# Patient Record
Sex: Female | Born: 1945
Health system: Southern US, Community
[De-identification: ages and names within clinical notes are randomized; demographics above are authoritative.]

## PROBLEM LIST (undated history)

## (undated) DIAGNOSIS — F419 Anxiety disorder, unspecified: Secondary | ICD-10-CM

## (undated) DIAGNOSIS — I4891 Unspecified atrial fibrillation: Secondary | ICD-10-CM

## (undated) DIAGNOSIS — I10 Essential (primary) hypertension: Secondary | ICD-10-CM

## (undated) DIAGNOSIS — K649 Unspecified hemorrhoids: Secondary | ICD-10-CM

## (undated) DIAGNOSIS — R053 Chronic cough: Secondary | ICD-10-CM

## (undated) DIAGNOSIS — R7303 Prediabetes: Secondary | ICD-10-CM

## (undated) DIAGNOSIS — F329 Major depressive disorder, single episode, unspecified: Secondary | ICD-10-CM

## (undated) DIAGNOSIS — R531 Weakness: Secondary | ICD-10-CM

## (undated) DIAGNOSIS — Z8679 Personal history of other diseases of the circulatory system: Secondary | ICD-10-CM

## (undated) DIAGNOSIS — H269 Unspecified cataract: Secondary | ICD-10-CM

## (undated) DIAGNOSIS — K219 Gastro-esophageal reflux disease without esophagitis: Secondary | ICD-10-CM

## (undated) DIAGNOSIS — E785 Hyperlipidemia, unspecified: Secondary | ICD-10-CM

## (undated) DIAGNOSIS — Z86010 Personal history of colonic polyps: Secondary | ICD-10-CM

## (undated) DIAGNOSIS — F32A Depression, unspecified: Secondary | ICD-10-CM

## (undated) DIAGNOSIS — Z8709 Personal history of other diseases of the respiratory system: Secondary | ICD-10-CM

## (undated) DIAGNOSIS — N183 Chronic kidney disease, stage 3 unspecified: Secondary | ICD-10-CM

## (undated) DIAGNOSIS — R3915 Urgency of urination: Secondary | ICD-10-CM

## (undated) DIAGNOSIS — G4730 Sleep apnea, unspecified: Secondary | ICD-10-CM

## (undated) DIAGNOSIS — M1990 Unspecified osteoarthritis, unspecified site: Secondary | ICD-10-CM

## (undated) HISTORY — PX: ESOPHAGOGASTRODUODENOSCOPY: SHX1529

## (undated) HISTORY — PX: FRACTURE SURGERY: SHX138

## (undated) HISTORY — DX: Gastro-esophageal reflux disease without esophagitis: K21.9

## (undated) HISTORY — DX: Chronic kidney disease, stage 3 unspecified: N18.30

## (undated) HISTORY — PX: HEEL SPUR EXCISION: SHX1733

## (undated) HISTORY — DX: Personal history of other diseases of the circulatory system: Z86.79

## (undated) HISTORY — DX: Chronic cough: R05.3

## (undated) HISTORY — PX: BACK SURGERY: SHX140

## (undated) HISTORY — DX: Chronic kidney disease, stage 3 (moderate): N18.3

## (undated) HISTORY — DX: Essential (primary) hypertension: I10

## (undated) HISTORY — PX: OTHER SURGICAL HISTORY: SHX169

## (undated) HISTORY — PX: NISSEN FUNDOPLICATION: SHX2091

## (undated) HISTORY — PX: COLONOSCOPY: SHX174

## (undated) HISTORY — PX: FOOT SURGERY: SHX648

## (undated) HISTORY — PX: CARPAL TUNNEL RELEASE: SHX101

---

## 1973-01-12 HISTORY — PX: CHOLECYSTECTOMY: SHX55

## 1978-01-12 HISTORY — PX: ABDOMINAL HYSTERECTOMY: SHX81

## 1998-11-13 ENCOUNTER — Encounter: Admission: RE | Admit: 1998-11-13 | Discharge: 1998-11-13 | Payer: Self-pay | Admitting: Obstetrics and Gynecology

## 1998-11-13 ENCOUNTER — Encounter: Payer: Self-pay | Admitting: Obstetrics and Gynecology

## 1999-07-30 ENCOUNTER — Ambulatory Visit (HOSPITAL_BASED_OUTPATIENT_CLINIC_OR_DEPARTMENT_OTHER): Admission: RE | Admit: 1999-07-30 | Discharge: 1999-07-30 | Payer: Self-pay | Admitting: Orthopedic Surgery

## 1999-09-22 ENCOUNTER — Ambulatory Visit (HOSPITAL_BASED_OUTPATIENT_CLINIC_OR_DEPARTMENT_OTHER): Admission: RE | Admit: 1999-09-22 | Discharge: 1999-09-22 | Payer: Self-pay | Admitting: Orthopedic Surgery

## 1999-11-19 ENCOUNTER — Encounter: Payer: Self-pay | Admitting: Obstetrics and Gynecology

## 1999-11-19 ENCOUNTER — Encounter: Admission: RE | Admit: 1999-11-19 | Discharge: 1999-11-19 | Payer: Self-pay | Admitting: Obstetrics and Gynecology

## 2000-11-19 ENCOUNTER — Encounter: Payer: Self-pay | Admitting: Obstetrics and Gynecology

## 2000-11-19 ENCOUNTER — Encounter: Admission: RE | Admit: 2000-11-19 | Discharge: 2000-11-19 | Payer: Self-pay | Admitting: Obstetrics and Gynecology

## 2000-12-13 ENCOUNTER — Ambulatory Visit (HOSPITAL_COMMUNITY): Admission: RE | Admit: 2000-12-13 | Discharge: 2000-12-13 | Payer: Self-pay | Admitting: *Deleted

## 2000-12-13 ENCOUNTER — Encounter (INDEPENDENT_AMBULATORY_CARE_PROVIDER_SITE_OTHER): Payer: Self-pay | Admitting: *Deleted

## 2001-11-24 ENCOUNTER — Ambulatory Visit (HOSPITAL_COMMUNITY): Admission: RE | Admit: 2001-11-24 | Discharge: 2001-11-24 | Payer: Self-pay | Admitting: Family Medicine

## 2001-11-24 ENCOUNTER — Encounter: Payer: Self-pay | Admitting: Family Medicine

## 2001-12-05 ENCOUNTER — Encounter: Payer: Self-pay | Admitting: Obstetrics and Gynecology

## 2001-12-05 ENCOUNTER — Encounter: Admission: RE | Admit: 2001-12-05 | Discharge: 2001-12-05 | Payer: Self-pay | Admitting: Obstetrics and Gynecology

## 2001-12-14 ENCOUNTER — Encounter: Payer: Self-pay | Admitting: Family Medicine

## 2001-12-14 ENCOUNTER — Ambulatory Visit (HOSPITAL_COMMUNITY): Admission: RE | Admit: 2001-12-14 | Discharge: 2001-12-14 | Payer: Self-pay | Admitting: Family Medicine

## 2002-03-22 ENCOUNTER — Encounter: Payer: Self-pay | Admitting: Family Medicine

## 2002-03-22 ENCOUNTER — Ambulatory Visit (HOSPITAL_COMMUNITY): Admission: RE | Admit: 2002-03-22 | Discharge: 2002-03-22 | Payer: Self-pay | Admitting: Family Medicine

## 2002-11-17 ENCOUNTER — Ambulatory Visit (HOSPITAL_COMMUNITY): Admission: RE | Admit: 2002-11-17 | Discharge: 2002-11-17 | Payer: Self-pay | Admitting: Pulmonary Disease

## 2003-01-02 ENCOUNTER — Encounter: Admission: RE | Admit: 2003-01-02 | Discharge: 2003-01-02 | Payer: Self-pay | Admitting: Obstetrics and Gynecology

## 2003-04-27 ENCOUNTER — Ambulatory Visit (HOSPITAL_BASED_OUTPATIENT_CLINIC_OR_DEPARTMENT_OTHER): Admission: RE | Admit: 2003-04-27 | Discharge: 2003-04-27 | Payer: Self-pay | Admitting: Pulmonary Disease

## 2003-05-03 ENCOUNTER — Ambulatory Visit: Admission: RE | Admit: 2003-05-03 | Discharge: 2003-05-03 | Payer: Self-pay | Admitting: Pulmonary Disease

## 2003-09-04 ENCOUNTER — Ambulatory Visit (HOSPITAL_COMMUNITY): Admission: RE | Admit: 2003-09-04 | Discharge: 2003-09-04 | Payer: Self-pay | Admitting: *Deleted

## 2003-12-03 ENCOUNTER — Ambulatory Visit: Payer: Self-pay | Admitting: Pulmonary Disease

## 2004-01-03 ENCOUNTER — Encounter: Admission: RE | Admit: 2004-01-03 | Discharge: 2004-01-03 | Payer: Self-pay | Admitting: Family Medicine

## 2004-02-19 ENCOUNTER — Ambulatory Visit: Payer: Self-pay | Admitting: Pulmonary Disease

## 2004-02-25 ENCOUNTER — Ambulatory Visit: Payer: Self-pay | Admitting: Pulmonary Disease

## 2004-03-10 ENCOUNTER — Ambulatory Visit: Payer: Self-pay | Admitting: Pulmonary Disease

## 2004-03-16 ENCOUNTER — Emergency Department (HOSPITAL_COMMUNITY): Admission: EM | Admit: 2004-03-16 | Discharge: 2004-03-16 | Payer: Self-pay | Admitting: Emergency Medicine

## 2004-03-18 ENCOUNTER — Ambulatory Visit: Payer: Self-pay | Admitting: Pulmonary Disease

## 2004-03-25 ENCOUNTER — Ambulatory Visit (HOSPITAL_COMMUNITY): Admission: RE | Admit: 2004-03-25 | Discharge: 2004-03-25 | Payer: Self-pay | Admitting: Family Medicine

## 2004-04-11 ENCOUNTER — Ambulatory Visit: Payer: Self-pay | Admitting: Pulmonary Disease

## 2004-04-28 ENCOUNTER — Ambulatory Visit: Payer: Self-pay | Admitting: Pulmonary Disease

## 2004-06-05 ENCOUNTER — Ambulatory Visit (HOSPITAL_COMMUNITY): Admission: RE | Admit: 2004-06-05 | Discharge: 2004-06-06 | Payer: Self-pay | Admitting: Gastroenterology

## 2004-06-27 ENCOUNTER — Encounter: Admission: RE | Admit: 2004-06-27 | Discharge: 2004-06-27 | Payer: Self-pay | Admitting: General Surgery

## 2004-07-03 ENCOUNTER — Ambulatory Visit (HOSPITAL_COMMUNITY): Admission: RE | Admit: 2004-07-03 | Discharge: 2004-07-03 | Payer: Self-pay | Admitting: *Deleted

## 2004-08-23 ENCOUNTER — Inpatient Hospital Stay (HOSPITAL_COMMUNITY): Admission: RE | Admit: 2004-08-23 | Discharge: 2004-08-24 | Payer: Self-pay | Admitting: General Surgery

## 2004-10-06 ENCOUNTER — Encounter: Admission: RE | Admit: 2004-10-06 | Discharge: 2004-10-06 | Payer: Self-pay | Admitting: General Surgery

## 2005-01-15 ENCOUNTER — Encounter: Admission: RE | Admit: 2005-01-15 | Discharge: 2005-01-15 | Payer: Self-pay | Admitting: Obstetrics and Gynecology

## 2006-01-19 ENCOUNTER — Encounter: Admission: RE | Admit: 2006-01-19 | Discharge: 2006-01-19 | Payer: Self-pay | Admitting: Family Medicine

## 2007-01-24 ENCOUNTER — Encounter: Admission: RE | Admit: 2007-01-24 | Discharge: 2007-01-24 | Payer: Self-pay | Admitting: Obstetrics and Gynecology

## 2008-02-02 ENCOUNTER — Encounter: Admission: RE | Admit: 2008-02-02 | Discharge: 2008-02-02 | Payer: Self-pay | Admitting: Family Medicine

## 2008-03-14 ENCOUNTER — Encounter: Admission: RE | Admit: 2008-03-14 | Discharge: 2008-03-14 | Payer: Self-pay | Admitting: Obstetrics and Gynecology

## 2008-12-23 ENCOUNTER — Emergency Department (HOSPITAL_COMMUNITY): Admission: EM | Admit: 2008-12-23 | Discharge: 2008-12-23 | Payer: Self-pay | Admitting: Emergency Medicine

## 2009-01-22 ENCOUNTER — Ambulatory Visit (HOSPITAL_BASED_OUTPATIENT_CLINIC_OR_DEPARTMENT_OTHER): Admission: RE | Admit: 2009-01-22 | Discharge: 2009-01-22 | Payer: Self-pay | Admitting: Orthopedic Surgery

## 2009-03-15 ENCOUNTER — Encounter: Admission: RE | Admit: 2009-03-15 | Discharge: 2009-03-15 | Payer: Self-pay | Admitting: Family Medicine

## 2009-04-22 ENCOUNTER — Emergency Department (HOSPITAL_COMMUNITY): Admission: EM | Admit: 2009-04-22 | Discharge: 2009-04-22 | Payer: Self-pay | Admitting: Emergency Medicine

## 2009-06-28 ENCOUNTER — Other Ambulatory Visit: Admission: RE | Admit: 2009-06-28 | Discharge: 2009-06-28 | Payer: Self-pay | Admitting: Pediatrics

## 2010-02-02 ENCOUNTER — Encounter: Payer: Self-pay | Admitting: General Surgery

## 2010-02-02 ENCOUNTER — Encounter: Payer: Self-pay | Admitting: Family Medicine

## 2010-02-06 ENCOUNTER — Other Ambulatory Visit: Payer: Self-pay | Admitting: Family Medicine

## 2010-02-06 DIAGNOSIS — Z1239 Encounter for other screening for malignant neoplasm of breast: Secondary | ICD-10-CM

## 2010-03-17 ENCOUNTER — Ambulatory Visit
Admission: RE | Admit: 2010-03-17 | Discharge: 2010-03-17 | Disposition: A | Discharge status: Home / Self Care | Payer: BC Managed Care – PPO | Source: Ambulatory Visit | Attending: Family Medicine | Admitting: Family Medicine

## 2010-03-17 DIAGNOSIS — Z1239 Encounter for other screening for malignant neoplasm of breast: Secondary | ICD-10-CM

## 2010-03-29 LAB — BASIC METABOLIC PANEL
BUN: 16 mg/dL (ref 6–23)
CO2: 28 mEq/L (ref 19–32)
Calcium: 9.1 mg/dL (ref 8.4–10.5)
Creatinine, Ser: 0.95 mg/dL (ref 0.4–1.2)
GFR calc Af Amer: >60 mL/min (ref >60)
GFR calc non Af Amer: 60 mL/min — ABNORMAL LOW (ref >60)
Glucose, Bld: 178 mg/dL — ABNORMAL HIGH (ref 70–99)
Potassium: 3.8 mEq/L (ref 3.5–5.1)
Sodium: 137 mEq/L (ref 135–145)

## 2010-04-15 LAB — POCT CARDIAC MARKERS
CKMB, poc: 1 ng/mL (ref 1.0–8.0)
Myoglobin, poc: 89.3 ng/mL (ref 12–200)
Troponin i, poc: <0.05 ng/mL (ref 0.00–0.09)

## 2010-05-30 NOTE — Op Note (Signed)
[name] [name], [name] [name]                 ACCOUNT NO.:  [id number]   MEDICAL RECORD NO.:  [id number]          PATIENT TYPE:  AMB   LOCATION:  DAY                          FACILITY:  [facility]   PHYSICIAN:  Angelia Mould. Derrell Lolling, M.D.DATE OF BIRTH:  [birth year]   DATE OF PROCEDURE:  08/22/2004  DATE OF DISCHARGE:                                 OPERATIVE REPORT   PREOPERATIVE DIAGNOSIS:  Gastroesophageal reflux disease.   POSTOPERATIVE DIAGNOSIS:  Gastroesophageal reflux disease.   OPERATION PERFORMED:  Laparoscopic Nissen fundoplication.   FIRST ASSISTANT:  Currie Paris, M.D.   OPERATIVE INDICATIONS:  This is a 65 year old morbidly obese white female.  She has had problems with laryngopharyngeal reflux for months, if not years.  Her major symptoms are spells of coughing, relieved by recumbent position.  She has noted intermittent voice change, exacerbated by eating certain food.  She does not have any dysphagia or water brash.  She had an open  cholecystectomy many years ago.  Her evaluation reveals on upper endoscopy  that her anatomy is fairly unremarkable.  She has had a 24-hour pH probe,  which was significantly positive for reflux.  She has had manometry, which  shows a normal esophageal peristalsis and a hypotensive lower esophageal  sphincter.  Upper GI shows a small sliding hiatal hernia with free reflux  but no stricture and slightly delayed gastric emptying.  Elective antireflux  surgery was offered to the patient and she elected to have that done.   OPERATIVE TECHNIQUE:  Following the induction of general endotracheal  anesthesia, Foley catheter was inserted.  The patient's abdomen was prepped  and draped in a sterile fashion.  Intravenous antibiotics were given.  She  was identified.  An 11 mm Optiview port was placed in the left upper  quadrant.  This was a atraumatic.  Pneumoperitoneum was created.  The OK was  inserted.  Visualization revealed no bleeding or injury in the  left upper  quadrant.  The left lobe of the liver looked slightly large.  There were a  lot of adhesions in the midline and across the right upper quadrant.  I  placed a 10 mm trocar in the left rectus muscle about 12 cm above the  umbilicus.  I then used the Harmonic scalpel to take down all of the  adhesions from the midline and the right upper quadrant and around the  falciform ligament.  I also found that there were some adhesions tethering  the pylorus of the stomach anteriorly and I took those down in case that was  altering her gastric emptying.  The stomach itself looked normal.  A 10 mm  trocar was placed in the right rectus sheath about 12 cm above the  umbilicus.  The 10 mm trocar was placed in the right subcostal region.  A 5  mm trocar was placed in the subxiphoid region and a deformable liver  retractor was inserted and held in place to lift up the left upper lobe.   Using traction and countertraction, we identified the hiatus of the stomach,  the esophagus, the right  and left crura of the diaphragm.  We divided the  gastrohepatic omentum using the Harmonic scalpel.  We continued the  dissection anteriorly across the anterior apex of the crura, dividing all of  the peritoneum and areolar tissue, and then took the dissection down the  anterior edge of the left crus a little bit.  This allowed [place] to identify  the esophagus and free it up a little bit.  We dissected some of the soft  tissue behind the esophagus from the right side and this helped mobilize the  esophagus a little bit but we could not create a window from the right side.   We then used the Harmonic scalpel to take down all of the short gastric  vessels.  This took some time because of her obesity but we were able to  take them completely down and completely free the fundus off of the short  gastric vessels and completely free the fundus off of the left crus.  We  dissected some of the peritoneum off of the left  crus and completely freed  it up posteriorly as well.  We then placed a Penrose drain around the  esophagus to promote mobilization.  This allowed [place] to enlarge the window  behind the esophagus.  The really was not much of a hiatal hernia at all and  the crura were not very wide.  We did place a single pledgeted suture of 0  Surgidac across the diaphragmatic crura posteriorly.  These were tied down  with the Ti-Knot device.  We then passed a 50-French lighted bougie into the  stomach under the direction of Dr. Sherrian Divers.  This was uneventful.  With the 50 bougie in place, the diaphragmatic hiatus seemed to be closed  adequately but we could pass the 5 mm instrument beside it without much  difficulty.   We then found a suitable piece of fundus and passed it from the left side  around posteriorly to the right.  We then found a suitable piece of fundus  on the left side and set up the wrap.  We found that we could wrap around  the 50-French bougie without any tension whatsoever.  The fundoplication was  created with three interrupted sutures of 0 Surgidac placed 1 cm apart,  making about a 2 to 2.5 cm length wrap.  Each of these three sutures was  tied using the Ti-Knot device.  After this was completed, we irrigated the  field.  We found no bleeding whatsoever.  We had had a little bit of  bleeding on the crura on the right and from some of the short gastric  vessels earlier on but this was minor, and at the completion of the case,  everything was completely dry and the irrigation fluid was completely clear.  We assessed the diaphragmatic closure, pulled the dilator out, assessed the  Nissen fundoplication and thought that it looked very good, saw no bleeding  from any of the adhesions, and felt that the case was completed.  The  trocars were removed under direct vision.  There was no bleeding from the trocar sites.  The pneumoperitoneum was released.  The skin incision was  closed with  skin staples.  Clean bandages were placed and the patient taken  to the recovery room in stable condition.  Estimated blood loss was about 30  to 40 cc.  Complications none.  Sponge and instrument counts were correct.      Angelia Mould. Derrell Lolling,  M.D.  Electronically Signed     HMI/MEDQ  D:  08/22/2004  T:  08/22/2004  Job:  [id number]   cc:   Althea Grimmer. Luther Parody, M.D.  [address], [address]  [place]  [place] [address]  Fax: [phone]   Danice Goltz, M.D. [facility]  [address] [place], [place] [address]   Stacie Acres. Cliffton Asters, M.D.  Fax: [phone]

## 2010-05-30 NOTE — Op Note (Signed)
[name] [name], SPOERL                 ACCOUNT NO.:  [id number]   MEDICAL RECORD NO.:  [id number]          PATIENT TYPE:  AMB   LOCATION:  ENDO                         FACILITY:  [facility]   PHYSICIAN:  Althea Grimmer. Santogade, M.D.DATE OF BIRTH:  1946/01/07   DATE OF PROCEDURE:  07/03/2004  DATE OF DISCHARGE:  07/03/2004                                 OPERATIVE REPORT   PROCEDURE:  Esophageal manometry.   The five-channel transnasal esophageal probe was passed.  The lower  esophageal sphincter was measured from 40-44.5 cm for a total lower  esophageal sphincter length of 4.5 cm.  The lower esophageal sphincter  pressure was 14.7 mmHg, which is low normal.  With wet swallows, however,  there was 57% relaxation, which is normal.  The manometric tracings revealed  the swallows to be clearly peristaltic with normal amplitudes.   IMPRESSION:  Essentially normal esophageal manometry study with low normal  lower esophageal sphincter pressures.   RECOMMENDATIONS:  If reflux is uncontrolled with dietary measures and proton  pump inhibitor, she is a candidate for laparoscopic Nissen fundoplication.       PJS/MEDQ  D:  07/21/2004  T:  07/22/2004  Job:  [id number]   cc:   Angelia Mould. Derrell Lolling, M.D.  [address], [address]  [place]  [place] [address]   Stacie Acres. Cliffton Asters, M.D.  Fax: [phone]

## 2010-05-30 NOTE — Op Note (Signed)
NAMEANZLEY, DIBBERN                 ACCOUNT NO.:  [id number]   MEDICAL RECORD NO.:  [id number]          PATIENT TYPE:  AMB   LOCATION:  ENDO                         FACILITY:  [facility]   PHYSICIAN:  Althea Grimmer. Santogade, M.D.DATE OF BIRTH:  02-May-1945   DATE OF PROCEDURE:  06/05/2004  DATE OF DISCHARGE:                                 OPERATIVE REPORT   PROCEDURE PERFORMED:  Twenty-four-hour ambulatory pH probe.   ENDOSCOPIST:  Althea Grimmer. Santogade, M.D.   DURATION:  Study duration was 23 hours 57 minutes.   FINDINGS:  The patient had 53 minutes of pH below 4 in the proximal channel  or 6% of total time with her longest episode being over three minutes.  In  the upright position in the distal channel she had 10.9% of time below pH 4  with 96 total minutes and 164 separate reflux episodes.  Her episodes of  reflux corresponded well  with the decrease in the pH.  She also had reflux  with coughing and belching.  Her Johnson-DeMeester  composite score was  10.9% in the upright position with normal being less than 6.3.  In the  recumbent position she had essentially no reflux with a score of 0.1%.   IMPRESSION:  Clearly documented reflux, which it is my understanding  occurred while taking once daily proton pump inhibitor. The patient remains  a candidate for antireflux surgery or twice daily proton pump inhibitors. It  is unfortunate that she refused to complete the manometry portion of the  study.       PJS/MEDQ  D:  06/11/2004  T:  06/12/2004  Job:  [id number]

## 2010-05-30 NOTE — Op Note (Signed)
[place]. [facility]  Patient:    [name] [name], [name] [name]                          MRN: [id number] Proc. Date: 07/30/99 Attending:  Elana Alm. Thurston Hole, M.D.                           Operative Report  PREOPERATIVE DIAGNOSIS:  Left heel plantar fascitis and calcaneal exostosis.  POSTOPERATIVE DIAGNOSIS:  Left heel plantar fascitis and calcaneal exostosis.  OPERATION:  Left heel plantar fascia release with partial calcaneal exostectomy.  SURGEON:  Elana Alm. Thurston Hole, M.D.  ASSISTANTTonny Bollman Adelberger, P.A.-C  ANESTHESIA:  General/ankle block.  OPERATIVE TIME:  30 minutes.  COMPLICATIONS:  None.  INDICATIONS FOR SURGERY:  [name] is a 65 year old woman who has had over two to three years of left heel plantar fascitis that has failed conservative care and is now to undergo plantar fascia release.  DESCRIPTION OF PROCEDURE:  [name] was brought to the operating room on July 30, 1999, after an ankle block had been placed in the holding area.  She was placed on the operating room in the supine position.  Her left leg was prepped using sterile Betadine and draped using the sterile technique. Initially I had tested the block, however, there was still some sensation in the bottom of the foot and thus she was converted to general anesthesia.  Her left leg was exsanguinated and a calf tourniquet elevated to 275 mm. Initially through a 3 cm plantar medial incision, initial exposure was made. The underlying subcutaneous tissues were incised along with a skin incision. The medial plantar nerve carefully protected while the medial aspect of the plantar fascia was exposed.  A hemostat was placed, one superior and one inferior to the plantar fascia and then this was released off of his calcaneal attachment.  The underlying spur was also resected with a rongeur.  After this was done there was found to be a very smooth surface where there had been a a very rough spur and  there was found to be a satisfactory release of the plantar fascia.  The wounds were irrigated then closed using 3-0 Vicryl and 4-0 nylon.  Sterile compressive dressings were applied, and then the tourniquet was released and then the patient awakened and taken to the recovery room in stable condition.  FOLLOWUP CARE:  [name] will be followed as an outpatient on Vicodin and Naprosyn.  She will see me back in the office in a week for suture removal and followup DD:  07/30/99 TD:  07/30/99 Job: [id number]

## 2010-05-30 NOTE — Op Note (Signed)
[name] [name], [name] [name]                             ACCOUNT NO.:  [id number]   MEDICAL RECORD NO.:  [id number]                   PATIENT TYPE:  OUT   LOCATION:  CARD                                 FACILITY:  [facility]   PHYSICIAN:  Oley Balm. Sung Amabile, M.D. [facility]          DATE OF BIRTH:  Jan 18, 1945   DATE OF PROCEDURE:  05/03/2003  DATE OF DISCHARGE:  05/03/2003                                 OPERATIVE REPORT   PROCEDURE:  Cardiopulmonary stress test.   INDICATION:  Exertional dyspnea.   RESULTS:  Cardiopulmonary stress testing was performed on a graded  treadmill.  Testing was stopped due to dyspnea.  Effort was submaximal to  maximal.  Oxygen uptake at peak exercise was 1.69 liters per minute, which  is 94% of predicted, indicating normal exercise tolerance.  When corrected  for his body weight however, oxygen uptake was 14.3 mL/min per kg, which is  53% of predicted value.  This suggests that obesity is a major component of  his exercise limitation.   A peak exercise heart rate was 117, or 71% of predicted value, indicating  that cardiovascular reserve remained.  Heart rate slowed but was somewhat  blunted, probably as a result of beta-blocker therapy.  Oxygen pulse was  normal suggesting normal left ventricular function.  Blood pressure response  to exercise was normal.  EKG revealed no ischemia and no arrhythmias.   At peak exercise, minute ventilation was 61.2 liters per minute or 65% of  predicted value.  This indicates that  ventilatory reserve remained.  Gas  exchange parameters revealed no abnormalities.  Baseline spirometry was not  performed.  Post exercise spirometry was not performed.   SUMMARY:  Overall normal exercise tolerance and normal cardiopulmonary  response to exercise except for blunted heart rate response due to beta-  blocker therapy.  When his maximum oxygen uptake is corrected for his body  weight, he exhibits a moderate impairment.  This suggests that his  exercise  impairment is attributable entirely to obesity.                                               Oley Balm Sung Amabile, M.D. [facility]    DBS/MEDQ  D:  05/23/2003  T:  05/23/2003  Job:  [id number]   cc:   Danice Goltz, M.D. [facility]

## 2010-05-30 NOTE — Discharge Summary (Signed)
[name] [name], PERHAM                 ACCOUNT NO.:  [id number]   MEDICAL RECORD NO.:  [id number]          PATIENT TYPE:  INP   LOCATION:  1607                         FACILITY:  [facility]   PHYSICIAN:  Angelia Mould. Derrell Lolling, M.D.DATE OF BIRTH:  Jul 08, 1945   DATE OF ADMISSION:  08/22/2004  DATE OF DISCHARGE:  08/24/2004                                 DISCHARGE SUMMARY   FINAL DIAGNOSES:  1.  Gastroesophageal reflux disease.  2.  Suspect laryngopharyngeal reflux.  3.  Hypertension.  4.  Sleep apnea.  5.  Status post open cholecystectomy.  6.  Status post partial hysterectomy.   OPERATION PERFORMED:  Laparoscopic Nissen fundoplication, date of surgery  August 22, 2004.   HISTORY:  This is a 65 year old white female who has been having symptoms of  laryngopharyngeal reflux for some time with symptoms of coughing and voice  change exacerbated by eating certain acid containing foods. She denies  dysphagia and denies water brash, did have belching and acid sensation, a  couple of episodes where she choked on fluid at night. She has been  evaluated by Dr. Danice Goltz as an outpatient for obstructive sleep apnea  and Dr. Jayme Cloud has suspected reflux disease. She has been seen by Dr. Elvera Maria who did an ENT exam and did not see any abnormalities. She was evaluated  by Roosvelt Harps with upper endoscopy which was unremarkable. CT scan  which was negative, and a 24-hour pH probe which was significantly positive  for reflux. I was asked to see her. I felt she was a reasonable candidate  for antireflux surgery.  We arranged for a manometry which showed hypotonic  which showed a lower esophageal sphincter but good esophageal peristalsis  and amplitude. Upper GI showed a small sliding hiatal hernia and free reflux  with no stricture or mass. She had very slight delayed gastric emptying. She  was counseled regarding options of surgical intervention and wanted to have  that done.   PHYSICAL  EXAMINATION:  GENERAL:  This is an overweight white female. Her  voice was somewhat strained. Height 5 feet, 7 inches, weight 236.  VITAL SIGNS: Blood pressure 150/83, pulse 87.  NECK:  Revealed no mass or jugular distension.  LUNGS:  Clear to auscultation. No wheezing.  HEART:  Regular rate and rhythm, no murmur. Peripheral pulses were palpable.  ABDOMEN:  Obese, soft, nontender. Well-healed right subcostal incision.  There was no hernia or mass.   HOSPITAL COURSE:  On admission the patient was taken to the operating room  and underwent a laparoscopic Nissen fundoplication. We placed one pledgeted  suture in the esophageal hiatus but that was all that was necessary. We then  performed a standard Nissen fundoplication over a 50 French lighted dilator.  Postoperatively the patient did well. She had minimal cough on postoperative  day 1. Her abdomen was soft. She was started a clear liquid diet. That was  advanced slowly to a full liquid diet and she did well and was able to be  discharged on August 24, 2004. At that time she was swallowing well  without  dysphagia, no nausea or bloating or pain. Wounds looked fine. Vital signs  were stable.   MEDICATIONS:  She was advised to continue her medications which include:  1.  Diovan HCT 160/25 daily.  2.  Bayer aspirin 81 mg daily.  3.  Zelnorm 6 mg b.i.d.  4.  Singulair 10 daily.  5.  Zegerid 40 mg b.i.d.  6.  Omega 3 fatty acids daily.   FOLLOW UP:  She was asked to follow with me in the office in approximately 2  weeks. She was counseled carefully regarding a full liquid diet and avoiding  hot and cold food, and  Avoiding large bolus meals. She was also given a prescription for Vicodin  for pain.      Angelia Mould. Derrell Lolling, M.D.  Electronically Signed     HMI/MEDQ  D:  10/07/2004  T:  10/08/2004  Job:  [id number]   cc:   Stacie Acres. Cliffton Asters, M.D.  Fax: [phone]   Althea Grimmer. Luther Parody, M.D.  Fax: [phone]

## 2010-05-30 NOTE — Op Note (Signed)
[place]. [facility]  Patient:    [name] [name], [name] [name]                          MRN: [id number] Proc. Date: 09/22/99 Attending:  Elana Alm. Thurston Hole, M.D.                           Operative Report  PREOPERATIVE DIAGNOSIS:  Right heel plantar fasciitis and calcaneal exostosis.  POSTOPERATIVE DIAGNOSIS:  Right heel plantar fasciitis and calcaneal exostosis.  PROCEDURE:  Right heel plantar fascia release with calcaneal exostectomy.  SURGEON:  Elana Alm. Thurston Hole, M.D.  ASSISTANT:  Kirstin Adelberger, P.A.  ANESTHESIA:  Ankle block.  OPERATIVE TIME:  30 minutes.  COMPLICATIONS:  None.  INDICATIONS:  [name] is a 65 year old woman who has had significant right heel plantar fascia pain for the past 6 to 8 months increasing in nature with signs and symptoms consistent with plantar fasciitis and calcaneal exostosis and is now to undergo plantar fascia release with calcaneal exostectomy.  DESCRIPTION OF PROCEDURE:  [name] is brought to the operating room on September 22, 1999, placed on the operating table in the supine position. Right foot and leg was prepped using sterile Betadine and draped using sterile technique.  Initially, the foot and leg was exsanguinated and a calf tourniquet elevated to 275 mm.  A 3 cm plantar medial incision was made.  The underlying subcutaneous tissues were incised in line with the skin incision. The plantar nerves were carefully protected while the underlying plantar fascia was exposed.  A hemostat was placed with one of these on the plantar surface of the plantar fascia and one on the dorsal surface and then the plantar fascia was released from its calcaneal origin.  It was found to be very tight and inflammed.  Underlying this, then a small spur was removed with a rongeur and confirmed by intraoperative fluoroscopy.  After this was done, it was felt that all the pathology had been satisfactorily addressed.  The wound was  irrigated and closed with 3-0 Vicryl in the subcutaneous tissues and 4-0 nylon on the skin.  Sterile dressings were applied.  The tourniquet was released and the patient taken to the recovery room in stable condition.  FOLLOW-UP:  Ms. Dicke will be followed as an outpatient on Vicodin for pain. See her back in the office in a week for wound check and follow-up. DD:  02/24/00 TD:  02/24/00 Job: [id number]

## 2010-07-11 ENCOUNTER — Other Ambulatory Visit: Payer: Self-pay | Admitting: Family Medicine

## 2010-07-11 ENCOUNTER — Ambulatory Visit (HOSPITAL_COMMUNITY)
Admission: RE | Admit: 2010-07-11 | Discharge: 2010-07-11 | Disposition: A | Discharge status: Home / Self Care | Payer: Self-pay | Source: Ambulatory Visit | Attending: Family Medicine | Admitting: Family Medicine

## 2010-07-11 DIAGNOSIS — R609 Edema, unspecified: Secondary | ICD-10-CM

## 2010-07-11 DIAGNOSIS — M79609 Pain in unspecified limb: Secondary | ICD-10-CM

## 2010-07-11 DIAGNOSIS — R52 Pain, unspecified: Secondary | ICD-10-CM

## 2010-07-11 DIAGNOSIS — M7989 Other specified soft tissue disorders: Secondary | ICD-10-CM | POA: Insufficient documentation

## 2011-03-18 ENCOUNTER — Other Ambulatory Visit: Payer: Self-pay | Admitting: Family Medicine

## 2011-03-18 DIAGNOSIS — Z1231 Encounter for screening mammogram for malignant neoplasm of breast: Secondary | ICD-10-CM

## 2011-03-27 ENCOUNTER — Ambulatory Visit
Admission: RE | Admit: 2011-03-27 | Discharge: 2011-03-27 | Disposition: A | Discharge status: Home / Self Care | Payer: Medicare Other | Source: Ambulatory Visit | Attending: Family Medicine | Admitting: Family Medicine

## 2011-03-27 DIAGNOSIS — Z1231 Encounter for screening mammogram for malignant neoplasm of breast: Secondary | ICD-10-CM

## 2011-12-23 ENCOUNTER — Encounter (HOSPITAL_COMMUNITY): Payer: Self-pay | Admitting: Pharmacy Technician

## 2011-12-23 ENCOUNTER — Encounter (HOSPITAL_COMMUNITY): Payer: Self-pay | Admitting: *Deleted

## 2011-12-23 NOTE — Pre-Procedure Instructions (Signed)
Your procedure is scheduled [id number], December 29, 2011 Report to [facility] Admitting [id number] Call this number if you have problems morning of your procedure:[phone]  Follow all bowel prep instructions per your doctor's orders.  Do not eat or drink anything after midnight the night before your procedure. You may brush your teeth, rinse out your mouth, but no water, no food, no chewing gum, no mints, no candies, no chewing tobacco.     Take these medicines the morning of your procedure with A SIP OF WATER:Norvasc and Nexium   Please make arrangements for a responsible person to drive you home after the procedure. You cannot go home by cab/taxi. We recommend you have someone with you at home the first 24 hours after your procedure. Driver for procedure is spouse Jillyn Hidden  LEAVE ALL VALUABLES, JEWELRY, BILLFOLD AT HOME.  NO DENTURES, CONTACT LENSES ALLOWED IN THE ENDOSCOPY ROOM.   YOU MAY WEAR DEODORANT, PLEASE REMOVE ALL JEWELRY, WATCHES RINGS, BODY PIERCINGS AND LEAVE AT HOME.   WOMEN: NO MAKE-UP, LOTIONS PERFUMES

## 2011-12-29 ENCOUNTER — Ambulatory Visit (HOSPITAL_COMMUNITY): Payer: Medicare Other | Admitting: Anesthesiology

## 2011-12-29 ENCOUNTER — Encounter (HOSPITAL_COMMUNITY): Payer: Self-pay | Admitting: *Deleted

## 2011-12-29 ENCOUNTER — Encounter (HOSPITAL_COMMUNITY): Payer: Self-pay | Admitting: Anesthesiology

## 2011-12-29 ENCOUNTER — Ambulatory Visit (HOSPITAL_COMMUNITY)
Admission: RE | Admit: 2011-12-29 | Discharge: 2011-12-29 | Disposition: A | Discharge status: Home / Self Care | Payer: Medicare Other | Source: Ambulatory Visit | Attending: Gastroenterology | Admitting: Gastroenterology

## 2011-12-29 ENCOUNTER — Encounter (HOSPITAL_COMMUNITY)
Admission: RE | Disposition: A | Discharge status: Home / Self Care | Payer: Self-pay | Source: Ambulatory Visit | Attending: Gastroenterology

## 2011-12-29 ENCOUNTER — Other Ambulatory Visit: Payer: Self-pay | Admitting: Gastroenterology

## 2011-12-29 DIAGNOSIS — D126 Benign neoplasm of colon, unspecified: Secondary | ICD-10-CM

## 2011-12-29 DIAGNOSIS — I10 Essential (primary) hypertension: Secondary | ICD-10-CM | POA: Insufficient documentation

## 2011-12-29 DIAGNOSIS — Z79899 Other long term (current) drug therapy: Secondary | ICD-10-CM | POA: Insufficient documentation

## 2011-12-29 DIAGNOSIS — K648 Other hemorrhoids: Secondary | ICD-10-CM | POA: Insufficient documentation

## 2011-12-29 DIAGNOSIS — G4730 Sleep apnea, unspecified: Secondary | ICD-10-CM | POA: Insufficient documentation

## 2011-12-29 DIAGNOSIS — K219 Gastro-esophageal reflux disease without esophagitis: Secondary | ICD-10-CM | POA: Insufficient documentation

## 2011-12-29 HISTORY — PX: HOT HEMOSTASIS: SHX5433

## 2011-12-29 HISTORY — DX: Unspecified hemorrhoids: K64.9

## 2011-12-29 HISTORY — DX: Benign neoplasm of colon, unspecified: D12.6

## 2011-12-29 HISTORY — DX: Gastro-esophageal reflux disease without esophagitis: K21.9

## 2011-12-29 HISTORY — DX: Essential (primary) hypertension: I10

## 2011-12-29 HISTORY — PX: COLONOSCOPY WITH PROPOFOL: SHX5780

## 2011-12-29 HISTORY — DX: Sleep apnea, unspecified: G47.30

## 2011-12-29 SURGERY — COLONOSCOPY WITH PROPOFOL
Anesthesia: Monitor Anesthesia Care

## 2011-12-29 MED ORDER — PROPOFOL 10 MG/ML IV EMUL
INTRAVENOUS | Status: DC | PRN
  Administered 2011-12-29: 140 ug/kg/min via INTRAVENOUS

## 2011-12-29 MED ORDER — MIDAZOLAM HCL 5 MG/5ML IJ SOLN
INTRAMUSCULAR | Status: DC | PRN
  Administered 2011-12-29 (×2): 1 mg via INTRAVENOUS

## 2011-12-29 MED ORDER — KETAMINE HCL 10 MG/ML IJ SOLN
INTRAMUSCULAR | Status: DC | PRN
  Administered 2011-12-29 (×5): 5 mg via INTRAVENOUS

## 2011-12-29 MED ORDER — LACTATED RINGERS IV SOLN
INTRAVENOUS | Status: DC
  Administered 2011-12-29: 1000 mL via INTRAVENOUS

## 2011-12-29 MED ORDER — FENTANYL CITRATE 0.05 MG/ML IJ SOLN
INTRAMUSCULAR | Status: DC | PRN
  Administered 2011-12-29 (×2): 50 ug via INTRAVENOUS

## 2011-12-29 MED ORDER — SODIUM CHLORIDE 0.9 % IV SOLN: INTRAVENOUS | Status: DC

## 2011-12-29 SURGICAL SUPPLY — 22 items

## 2011-12-29 NOTE — Interval H&P Note (Deleted)
History and Physical Interval Note:  12/29/2011 8:45 AM  [name] [name]  has presented today for surgery, with the diagnosis of colon polyp  The various methods of treatment have been discussed with the patient and family. After consideration of risks, benefits and other options for treatment, the patient has consented to  Procedure(s) (LRB) with comments: COLONOSCOPY WITH PROPOFOL (N/A) HOT HEMOSTASIS (ARGON PLASMA COAGULATION/BICAP) (N/A) as a surgical intervention .  The patient's history has been reviewed, patient examined, no change in status, stable for surgery.  I have reviewed the patient's chart and labs.  Questions were answered to the patient's satisfaction.     Dameer Speiser C.

## 2011-12-29 NOTE — Op Note (Signed)
[facility] [address] [place], [address]   COLONOSCOPY PROCEDURE REPORT  PATIENT: [name] [name], [name] [name]  MR#: [id number] BIRTHDATE: [birth year] , 66  yrs. old GENDER: Female ENDOSCOPIST: Charlott Rakes, MD REFERRED MW:[id number] Wynelle Link, M.D. PROCEDURE DATE:  12/29/2011 PROCEDURE:   Colonoscopy with snare polypectomy ASA CLASS:   Class III INDICATIONS:follow up of adenomatous colonic polyp(s). MEDICATIONS: See Anesthesia Report.  DESCRIPTION OF PROCEDURE:   After the risks benefits and alternatives of the procedure were thoroughly explained, informed consent was obtained.  The Pentax Ped Colon [id number]  endoscope was introduced through the anus and advanced to the cecum, which was identified by both the appendix and ileocecal valve , limited by No adverse events experienced.   The quality of the prep was good. . The instrument was then slowly withdrawn as the colon was fully examined.     FINDINGS:  Rectal exam unremarkable.  Pediatric colonoscope inserted into the colon and advanced to the cecum, where the appendiceal orifice and ileocecal valve were identified.   In the cecum 2 sessile polyps were seen with one being approximately 6 mm and the other 8 mm in size. Both polyps were removed with snare cautery. On careful withdrawal of the colonoscope a semi-sessile polyp was seen at a previous tattooed site. This polyp was removed piecemeal and the small portion that remained was fulgurated with APC. Retroflexion revealed small internal hemorrhoids.  COMPLICATIONS: None  IMPRESSION:     1. Colon polyps X 3 - removed as stated above 2. Internal hemorrhoids  RECOMMENDATIONS: F/U on path; NO aspirin products for 14 days    ______________________________ eSigned:  Charlott Rakes, MD 12/29/2011 12:01 PM   WN:[id number] Wynelle Link, MD  PATIENT NAME:  [name] [name], [name] [name] MR#: [id number]

## 2011-12-29 NOTE — Transfer of Care (Signed)
Immediate Anesthesia Transfer of Care Note  Patient: [name] [name]  Procedure(s) Performed: Procedure(s) (LRB) with comments: COLONOSCOPY WITH PROPOFOL (N/A) HOT HEMOSTASIS (ARGON PLASMA COAGULATION/BICAP) (N/A)  Patient Location: PACU  Anesthesia Type:MAC  Level of Consciousness: awake, alert  and oriented  Airway & Oxygen Therapy: Patient Spontanous Breathing  Post-op Assessment: Report given to PACU RN and Post -op Vital signs reviewed and stable  Post vital signs: Reviewed and stable  Complications: No apparent anesthesia complications

## 2011-12-29 NOTE — Preoperative (Signed)
Beta Blockers   Reason not to administer Beta Blockers:Not Applicable 

## 2011-12-29 NOTE — Anesthesia Preprocedure Evaluation (Addendum)
Anesthesia Evaluation  Patient identified by MRN, date of birth, ID band Patient awake    Reviewed: Allergy & Precautions, H&P , NPO status , Patient's Chart, lab work & pertinent test results  Airway Mallampati: II TM Distance: >3 FB Neck ROM: Full    Dental  (+) Dental Advisory Given and Teeth Intact   Pulmonary sleep apnea ,  breath sounds clear to auscultation  Pulmonary exam normal       Cardiovascular hypertension, Pt. on medications Rhythm:Regular Rate:Normal     Neuro/Psych negative neurological ROS  negative psych ROS   GI/Hepatic Neg liver ROS, GERD-  Medicated,  Endo/Other  negative endocrine ROS  Renal/GU negative Renal ROS     Musculoskeletal negative musculoskeletal ROS (+)   Abdominal (+) + obese,   Peds  Hematology negative hematology ROS (+)   Anesthesia Other Findings   Reproductive/Obstetrics                          Anesthesia Physical Anesthesia Plan  ASA: II  Anesthesia Plan: MAC   Post-op Pain Management:    Induction: Intravenous  Airway Management Planned: Simple Face Mask  Additional Equipment:   Intra-op Plan:   Post-operative Plan:   Informed Consent: I have reviewed the patients History and Physical, chart, labs and discussed the procedure including the risks, benefits and alternatives for the proposed anesthesia with the patient or authorized representative who has indicated his/her understanding and acceptance.   Dental advisory given  Plan Discussed with: CRNA  Anesthesia Plan Comments:         Anesthesia Quick Evaluation

## 2011-12-29 NOTE — Anesthesia Postprocedure Evaluation (Signed)
Anesthesia Post Note  Patient: [name] [name]  Procedure(s) Performed: Procedure(s) (LRB): COLONOSCOPY WITH PROPOFOL (N/A) HOT HEMOSTASIS (ARGON PLASMA COAGULATION/BICAP) (N/A)  Anesthesia type: MAC  Patient location: PACU  Post pain: Pain level controlled  Post assessment: Post-op Vital signs reviewed  Last Vitals: BP 124/62  Pulse 71  Temp 36.3 C (Oral)  Resp 20  Ht 5\' 8"  (1.727 m)  Wt 212 lb (96.163 kg)  BMI 32.23 kg/m2  SpO2 97%  Post vital signs: Reviewed  Level of consciousness: awake  Complications: No apparent anesthesia complications

## 2011-12-29 NOTE — H&P (Signed)
  Date of Initial H&P: 12/25/11.  History reviewed, patient examined, no change in status, stable for surgery. Colonoscopy to reassess polypectomy site in ascending colon and look for residual polyp.

## 2011-12-29 NOTE — H&P (Deleted)
  Date of Initial H&P: 12/29/11.  History reviewed, patient examined, no change in status, stable for surgery. Colonoscopy to reassess polypectomy site in ascending colon and look for residual polyp.

## 2011-12-30 ENCOUNTER — Encounter (HOSPITAL_COMMUNITY): Payer: Self-pay | Admitting: Gastroenterology

## 2012-03-09 ENCOUNTER — Other Ambulatory Visit: Payer: Self-pay

## 2012-03-09 DIAGNOSIS — Z1231 Encounter for screening mammogram for malignant neoplasm of breast: Secondary | ICD-10-CM

## 2012-03-28 ENCOUNTER — Ambulatory Visit
Admission: RE | Admit: 2012-03-28 | Discharge: 2012-03-28 | Disposition: A | Discharge status: Home / Self Care | Payer: Medicare Other | Source: Ambulatory Visit

## 2012-03-28 DIAGNOSIS — Z1231 Encounter for screening mammogram for malignant neoplasm of breast: Secondary | ICD-10-CM

## 2013-02-22 ENCOUNTER — Other Ambulatory Visit: Payer: Self-pay | Admitting: Family Medicine

## 2013-02-22 ENCOUNTER — Ambulatory Visit
Admission: RE | Admit: 2013-02-22 | Discharge: 2013-02-22 | Disposition: A | Discharge status: Home / Self Care | Payer: PRIVATE HEALTH INSURANCE | Source: Ambulatory Visit | Attending: Family Medicine | Admitting: Family Medicine

## 2013-02-22 DIAGNOSIS — M545 Low back pain, unspecified: Secondary | ICD-10-CM

## 2013-02-27 ENCOUNTER — Other Ambulatory Visit: Payer: Self-pay | Admitting: Family Medicine

## 2013-02-27 DIAGNOSIS — M545 Low back pain, unspecified: Secondary | ICD-10-CM

## 2013-03-05 ENCOUNTER — Ambulatory Visit
Admission: RE | Admit: 2013-03-05 | Discharge: 2013-03-05 | Disposition: A | Discharge status: Home / Self Care | Payer: Medicare HMO | Source: Ambulatory Visit | Attending: Family Medicine | Admitting: Family Medicine

## 2013-03-05 DIAGNOSIS — M545 Low back pain, unspecified: Secondary | ICD-10-CM

## 2013-03-15 ENCOUNTER — Other Ambulatory Visit: Payer: Self-pay | Admitting: Neurological Surgery

## 2013-03-22 ENCOUNTER — Encounter (HOSPITAL_COMMUNITY): Payer: Self-pay

## 2013-03-27 NOTE — Pre-Procedure Instructions (Signed)
LING [facility]  03/27/2013   Your procedure is scheduled on:  Wed, mar 25 @ 1:50 PM  Report to [facility]  at 11:50 AM.  Call this number if you have problems the morning of surgery: [phone]   Remember:   Do not eat food or drink liquids after midnight.   Take these medicines the morning of surgery with A SIP OF WATER: Amlodipine(Norvasc) and Tramadol(Ultram-if needed)              Stop taking your Aspirin. No Goody's,BC's,Aleve,Ibuprofen,Fish Oil,or any Herbal Medications   Do not wear jewelry, make-up or nail polish.  Do not wear lotions, powders, or perfumes. You may wear deodorant.  Do not shave 48 hours prior to surgery.   Do not bring valuables to the hospital.  [facility] is not responsible                  for any belongings or valuables.               Contacts, dentures or bridgework may not be worn into surgery.  Leave suitcase in the car. After surgery it may be brought to your room.  For patients admitted to the hospital, discharge time is determined by your                treatment team.               Patients discharged the day of surgery will not be allowed to drive  home.    Special Instructions:  [facility] - Preparing for Surgery  Before surgery, you can play an important role.  Because skin is not sterile, your skin needs to be as free of germs as possible.  You can reduce the number of germs on you skin by washing with CHG (chlorahexidine gluconate) soap before surgery.  CHG is an antiseptic cleaner which kills germs and bonds with the skin to continue killing germs even after washing.  Please DO NOT use if you have an allergy to CHG or antibacterial soaps.  If your skin becomes reddened/irritated stop using the CHG and inform your nurse when you arrive at Short Stay.  Do not shave (including legs and underarms) for at least 48 hours prior to the first CHG shower.  You may shave your face.  Please follow these instructions carefully:   1.   Shower with CHG Soap the night before surgery and the                                morning of Surgery.  2.  If you choose to wash your hair, wash your hair first as usual with your       normal shampoo.  3.  After you shampoo, rinse your hair and body thoroughly to remove the                      Shampoo.  4.  Use CHG as you would any other liquid soap.  You can apply chg directly       to the skin and wash gently with scrungie or a clean washcloth.  5.  Apply the CHG Soap to your body ONLY FROM THE NECK DOWN.        Do not use on open wounds or open sores.  Avoid contact with your eyes,       ears,  mouth and genitals (private parts).  Wash genitals (private parts)       with your normal soap.  6.  Wash thoroughly, paying special attention to the area where your surgery        will be performed.  7.  Thoroughly rinse your body with warm water from the neck down.  8.  DO NOT shower/wash with your normal soap after using and rinsing off       the CHG Soap.  9.  Pat yourself dry with a clean towel.            10.  Wear clean pajamas.            11.  Place clean sheets on your bed the night of your first shower and do not        sleep with pets.  Day of Surgery  Do not apply any lotions/deoderants the morning of surgery.  Please wear clean clothes to the hospital/surgery center.     Please read over the following fact sheets that you were given: Pain Booklet, Coughing and Deep Breathing, MRSA Information and Surgical Site Infection Prevention

## 2013-03-28 ENCOUNTER — Encounter (HOSPITAL_COMMUNITY)
Admission: RE | Admit: 2013-03-28 | Discharge: 2013-03-28 | Disposition: A | Discharge status: Home / Self Care | Payer: Medicare HMO | Source: Ambulatory Visit | Attending: Neurological Surgery | Admitting: Neurological Surgery

## 2013-03-28 ENCOUNTER — Encounter (HOSPITAL_COMMUNITY): Payer: Self-pay

## 2013-03-28 DIAGNOSIS — Z01812 Encounter for preprocedural laboratory examination: Secondary | ICD-10-CM | POA: Insufficient documentation

## 2013-03-28 DIAGNOSIS — Z01818 Encounter for other preprocedural examination: Secondary | ICD-10-CM | POA: Insufficient documentation

## 2013-03-28 DIAGNOSIS — Z01810 Encounter for preprocedural cardiovascular examination: Secondary | ICD-10-CM | POA: Insufficient documentation

## 2013-03-28 HISTORY — DX: Weakness: R53.1

## 2013-03-28 HISTORY — DX: Unspecified cataract: H26.9

## 2013-03-28 HISTORY — DX: Personal history of colonic polyps: Z86.010

## 2013-03-28 HISTORY — DX: Personal history of other diseases of the respiratory system: Z87.09

## 2013-03-28 HISTORY — DX: Urgency of urination: R39.15

## 2013-03-28 HISTORY — DX: Hyperlipidemia, unspecified: E78.5

## 2013-03-28 LAB — BASIC METABOLIC PANEL
BUN: 21 mg/dL (ref 6–23)
CHLORIDE: 99 meq/L (ref 96–112)
CO2: 30 mEq/L (ref 19–32)
Calcium: 9.6 mg/dL (ref 8.4–10.5)
Creatinine, Ser: 0.97 mg/dL (ref 0.50–1.10)
GFR calc Af Amer: 69 mL/min — ABNORMAL LOW (ref >90)
GFR calc non Af Amer: 59 mL/min — ABNORMAL LOW (ref >90)
GLUCOSE: 115 mg/dL — AB (ref 70–99)
Potassium: 3.6 mEq/L — ABNORMAL LOW (ref 3.7–5.3)
Sodium: 143 mEq/L (ref 137–147)

## 2013-03-28 LAB — CBC WITH DIFFERENTIAL/PLATELET
BASOS PCT: 1 % (ref 0–1)
Basophils Absolute: 0.1 10*3/uL (ref 0.0–0.1)
EOS PCT: 5 % (ref 0–5)
Eosinophils Absolute: 0.4 10*3/uL (ref 0.0–0.7)
HEMATOCRIT: 42.3 % (ref 36.0–46.0)
Hemoglobin: 14.8 g/dL (ref 12.0–15.0)
Lymphocytes Relative: 48 % — ABNORMAL HIGH (ref 12–46)
Lymphs Abs: 3.8 10*3/uL (ref 0.7–4.0)
MCH: 31.5 pg (ref 26.0–34.0)
MCHC: 35 g/dL (ref 30.0–36.0)
MCV: 90 fL (ref 78.0–100.0)
MONO ABS: 0.8 10*3/uL (ref 0.1–1.0)
MONOS PCT: 10 % (ref 3–12)
NEUTROS PCT: 37 % — AB (ref 43–77)
Neutro Abs: 2.9 10*3/uL (ref 1.7–7.7)
Platelets: 216 10*3/uL (ref 150–400)
RBC: 4.7 MIL/uL (ref 3.87–5.11)
RDW: 13.4 % (ref 11.5–15.5)
WBC: 7.9 10*3/uL (ref 4.0–10.5)

## 2013-03-28 LAB — PROTIME-INR
INR: 0.93 (ref 0.00–1.49)
Prothrombin Time: 12.3 seconds (ref 11.6–15.2)

## 2013-03-28 NOTE — Progress Notes (Signed)
Unable to do nasal swab bc pt has been using Mupirocin since 03/16/13

## 2013-03-28 NOTE — Progress Notes (Signed)
Pt doesn't have a cardiologist  Stress test at least 20 yrs ago unsure to why it was even done  Denies ever having an echo or heart cath  Dr.Cynthia Dema Severin is Medical Md  Denies EKG or CXR in past yr

## 2013-04-04 MED ORDER — CEFAZOLIN SODIUM-DEXTROSE 2-3 GM-% IV SOLR
2.0000 g | INTRAVENOUS | Status: AC
  Administered 2013-04-05: 2 g via INTRAVENOUS
  Filled 2013-04-04: qty 50

## 2013-04-04 MED ORDER — DEXAMETHASONE SODIUM PHOSPHATE 10 MG/ML IJ SOLN
10.0000 mg | INTRAMUSCULAR | Status: AC
  Administered 2013-04-05: 10 mg via INTRAVENOUS
  Filled 2013-04-04: qty 1

## 2013-04-04 NOTE — Progress Notes (Signed)
Notified patient of time change, she was instructed to arrive at 1050am.

## 2013-04-05 ENCOUNTER — Encounter (HOSPITAL_COMMUNITY): Payer: Medicare HMO | Admitting: Certified Registered"

## 2013-04-05 ENCOUNTER — Ambulatory Visit (HOSPITAL_COMMUNITY)
Admission: RE | Admit: 2013-04-05 | Discharge: 2013-04-06 | Disposition: A | Discharge status: Home / Self Care | Payer: Medicare HMO | Source: Ambulatory Visit | Attending: Neurological Surgery | Admitting: Neurological Surgery

## 2013-04-05 ENCOUNTER — Encounter (HOSPITAL_COMMUNITY)
Admission: RE | Disposition: A | Discharge status: Home / Self Care | Payer: Self-pay | Source: Ambulatory Visit | Attending: Neurological Surgery

## 2013-04-05 ENCOUNTER — Encounter (HOSPITAL_COMMUNITY): Payer: Self-pay | Admitting: Neurological Surgery

## 2013-04-05 ENCOUNTER — Ambulatory Visit (HOSPITAL_COMMUNITY): Payer: Medicare HMO

## 2013-04-05 ENCOUNTER — Ambulatory Visit (HOSPITAL_COMMUNITY): Payer: Medicare HMO | Admitting: Certified Registered"

## 2013-04-05 DIAGNOSIS — Z9889 Other specified postprocedural states: Secondary | ICD-10-CM

## 2013-04-05 DIAGNOSIS — Z7982 Long term (current) use of aspirin: Secondary | ICD-10-CM | POA: Insufficient documentation

## 2013-04-05 DIAGNOSIS — E785 Hyperlipidemia, unspecified: Secondary | ICD-10-CM | POA: Insufficient documentation

## 2013-04-05 DIAGNOSIS — K219 Gastro-esophageal reflux disease without esophagitis: Secondary | ICD-10-CM | POA: Insufficient documentation

## 2013-04-05 DIAGNOSIS — M47817 Spondylosis without myelopathy or radiculopathy, lumbosacral region: Secondary | ICD-10-CM | POA: Insufficient documentation

## 2013-04-05 DIAGNOSIS — G4730 Sleep apnea, unspecified: Secondary | ICD-10-CM | POA: Insufficient documentation

## 2013-04-05 DIAGNOSIS — G8929 Other chronic pain: Secondary | ICD-10-CM | POA: Insufficient documentation

## 2013-04-05 DIAGNOSIS — I10 Essential (primary) hypertension: Secondary | ICD-10-CM | POA: Insufficient documentation

## 2013-04-05 DIAGNOSIS — M7130 Other bursal cyst, unspecified site: Secondary | ICD-10-CM | POA: Insufficient documentation

## 2013-04-05 HISTORY — DX: Other specified postprocedural states: Z98.890

## 2013-04-05 HISTORY — PX: LUMBAR LAMINECTOMY/DECOMPRESSION MICRODISCECTOMY: SHX5026

## 2013-04-05 SURGERY — LUMBAR LAMINECTOMY/DECOMPRESSION MICRODISCECTOMY 1 LEVEL
Anesthesia: General | Site: Back | Laterality: Left

## 2013-04-05 MED ORDER — ACETAMINOPHEN 325 MG PO TABS: 650.0000 mg | ORAL_TABLET | ORAL | Status: DC | PRN

## 2013-04-05 MED ORDER — SODIUM CHLORIDE 0.9 % IV SOLN: 250.0000 mL | INTRAVENOUS | Status: DC

## 2013-04-05 MED ORDER — METHOCARBAMOL 500 MG PO TABS
ORAL_TABLET | ORAL | Status: AC
  Filled 2013-04-05: qty 1

## 2013-04-05 MED ORDER — HYDROCHLOROTHIAZIDE 25 MG PO TABS
25.0000 mg | ORAL_TABLET | Freq: Every day | ORAL | Status: DC
  Filled 2013-04-05: qty 1

## 2013-04-05 MED ORDER — MENTHOL 3 MG MT LOZG: 1.0000 | LOZENGE | OROMUCOSAL | Status: DC | PRN

## 2013-04-05 MED ORDER — PHENYLEPHRINE HCL 10 MG/ML IJ SOLN
INTRAMUSCULAR | Status: DC | PRN
  Administered 2013-04-05: 40 ug via INTRAVENOUS

## 2013-04-05 MED ORDER — PHENOL 1.4 % MT LIQD: 1.0000 | OROMUCOSAL | Status: DC | PRN

## 2013-04-05 MED ORDER — PROPOFOL 10 MG/ML IV BOLUS
INTRAVENOUS | Status: AC
  Filled 2013-04-05: qty 20

## 2013-04-05 MED ORDER — DOXYCYCLINE HYCLATE 100 MG PO TABS
100.0000 mg | ORAL_TABLET | Freq: Two times a day (BID) | ORAL | Status: DC
  Administered 2013-04-05: 100 mg via ORAL
  Filled 2013-04-05 (×3): qty 1

## 2013-04-05 MED ORDER — HEMOSTATIC AGENTS (NO CHARGE) OPTIME
TOPICAL | Status: DC | PRN
  Administered 2013-04-05: 1 via TOPICAL

## 2013-04-05 MED ORDER — OXYCODONE HCL 5 MG PO TABS
ORAL_TABLET | ORAL | Status: AC
  Filled 2013-04-05: qty 1

## 2013-04-05 MED ORDER — PROMETHAZINE HCL 25 MG/ML IJ SOLN: 6.2500 mg | INTRAMUSCULAR | Status: DC | PRN

## 2013-04-05 MED ORDER — MIDAZOLAM HCL 2 MG/2ML IJ SOLN: 0.5000 mg | Freq: Once | INTRAMUSCULAR | Status: DC | PRN

## 2013-04-05 MED ORDER — POTASSIUM CHLORIDE IN NACL 20-0.9 MEQ/L-% IV SOLN
INTRAVENOUS | Status: DC
  Filled 2013-04-05 (×3): qty 1000

## 2013-04-05 MED ORDER — ROCURONIUM BROMIDE 100 MG/10ML IV SOLN
INTRAVENOUS | Status: DC | PRN
  Administered 2013-04-05: 50 mg via INTRAVENOUS

## 2013-04-05 MED ORDER — LIDOCAINE HCL (CARDIAC) 20 MG/ML IV SOLN
INTRAVENOUS | Status: DC | PRN
  Administered 2013-04-05: 30 mg via INTRAVENOUS

## 2013-04-05 MED ORDER — FENTANYL CITRATE 0.05 MG/ML IJ SOLN
INTRAMUSCULAR | Status: AC
  Filled 2013-04-05: qty 5

## 2013-04-05 MED ORDER — PROPOFOL 10 MG/ML IV BOLUS
INTRAVENOUS | Status: DC | PRN
  Administered 2013-04-05: 150 mg via INTRAVENOUS

## 2013-04-05 MED ORDER — LACTATED RINGERS IV SOLN
INTRAVENOUS | Status: DC
  Administered 2013-04-05: 11:00:00 via INTRAVENOUS

## 2013-04-05 MED ORDER — SODIUM CHLORIDE 0.9 % IJ SOLN
3.0000 mL | Freq: Two times a day (BID) | INTRAMUSCULAR | Status: DC
  Administered 2013-04-05: 3 mL via INTRAVENOUS

## 2013-04-05 MED ORDER — MUPIROCIN 2 % EX OINT
1.0000 "application " | TOPICAL_OINTMENT | Freq: Two times a day (BID) | CUTANEOUS | Status: DC
  Administered 2013-04-05: 1 via NASAL
  Filled 2013-04-05: qty 22

## 2013-04-05 MED ORDER — MIDAZOLAM HCL 5 MG/5ML IJ SOLN
INTRAMUSCULAR | Status: DC | PRN
  Administered 2013-04-05: 2 mg via INTRAVENOUS

## 2013-04-05 MED ORDER — THROMBIN 5000 UNITS EX SOLR
CUTANEOUS | Status: DC | PRN
  Administered 2013-04-05 (×2): 5000 [IU] via TOPICAL

## 2013-04-05 MED ORDER — AMLODIPINE BESYLATE 10 MG PO TABS
10.0000 mg | ORAL_TABLET | Freq: Every morning | ORAL | Status: DC
  Filled 2013-04-05: qty 1

## 2013-04-05 MED ORDER — METHOCARBAMOL 100 MG/ML IJ SOLN
500.0000 mg | Freq: Four times a day (QID) | INTRAVENOUS | Status: DC | PRN
  Filled 2013-04-05: qty 5

## 2013-04-05 MED ORDER — CEFAZOLIN SODIUM 1-5 GM-% IV SOLN
1.0000 g | Freq: Three times a day (TID) | INTRAVENOUS | Status: AC
  Administered 2013-04-05 – 2013-04-06 (×2): 1 g via INTRAVENOUS
  Filled 2013-04-05 (×2): qty 50

## 2013-04-05 MED ORDER — VALSARTAN-HYDROCHLOROTHIAZIDE 320-25 MG PO TABS: 1.0000 | ORAL_TABLET | Freq: Every morning | ORAL | Status: DC

## 2013-04-05 MED ORDER — HYDROMORPHONE HCL PF 1 MG/ML IJ SOLN
0.2500 mg | INTRAMUSCULAR | Status: DC | PRN
  Administered 2013-04-05 (×2): 0.5 mg via INTRAVENOUS

## 2013-04-05 MED ORDER — HYDROMORPHONE HCL PF 1 MG/ML IJ SOLN
INTRAMUSCULAR | Status: AC
  Filled 2013-04-05: qty 1

## 2013-04-05 MED ORDER — SODIUM CHLORIDE 0.9 % IR SOLN
Status: DC | PRN
  Administered 2013-04-05: 13:00:00

## 2013-04-05 MED ORDER — MIDAZOLAM HCL 2 MG/2ML IJ SOLN
INTRAMUSCULAR | Status: AC
  Filled 2013-04-05: qty 2

## 2013-04-05 MED ORDER — TRAMADOL HCL 50 MG PO TABS: 25.0000 mg | ORAL_TABLET | ORAL | Status: DC | PRN

## 2013-04-05 MED ORDER — ONDANSETRON HCL 4 MG/2ML IJ SOLN: 4.0000 mg | INTRAMUSCULAR | Status: DC | PRN

## 2013-04-05 MED ORDER — OXYCODONE HCL 5 MG/5ML PO SOLN: 5.0000 mg | Freq: Once | ORAL | Status: AC | PRN

## 2013-04-05 MED ORDER — MEPERIDINE HCL 25 MG/ML IJ SOLN: 6.2500 mg | INTRAMUSCULAR | Status: DC | PRN

## 2013-04-05 MED ORDER — BUPIVACAINE HCL (PF) 0.25 % IJ SOLN
INTRAMUSCULAR | Status: DC | PRN
  Administered 2013-04-05: 6 mL

## 2013-04-05 MED ORDER — FENTANYL CITRATE 0.05 MG/ML IJ SOLN
INTRAMUSCULAR | Status: DC | PRN
  Administered 2013-04-05 (×2): 50 ug via INTRAVENOUS
  Administered 2013-04-05: 150 ug via INTRAVENOUS

## 2013-04-05 MED ORDER — SODIUM CHLORIDE 0.9 % IJ SOLN: 3.0000 mL | INTRAMUSCULAR | Status: DC | PRN

## 2013-04-05 MED ORDER — MORPHINE SULFATE 2 MG/ML IJ SOLN: 1.0000 mg | INTRAMUSCULAR | Status: DC | PRN

## 2013-04-05 MED ORDER — OXYCODONE HCL 5 MG PO TABS
5.0000 mg | ORAL_TABLET | Freq: Once | ORAL | Status: AC | PRN
  Administered 2013-04-05: 5 mg via ORAL

## 2013-04-05 MED ORDER — THROMBIN 5000 UNITS EX SOLR
OROMUCOSAL | Status: DC | PRN
  Administered 2013-04-05: 13:00:00 via TOPICAL

## 2013-04-05 MED ORDER — GLYCOPYRROLATE 0.2 MG/ML IJ SOLN
INTRAMUSCULAR | Status: DC | PRN
  Administered 2013-04-05: 0.4 mg via INTRAVENOUS

## 2013-04-05 MED ORDER — ONDANSETRON HCL 4 MG/2ML IJ SOLN
INTRAMUSCULAR | Status: DC | PRN
  Administered 2013-04-05: 4 mg via INTRAVENOUS

## 2013-04-05 MED ORDER — HYDROCODONE-ACETAMINOPHEN 5-325 MG PO TABS
1.0000 | ORAL_TABLET | ORAL | Status: DC | PRN
  Administered 2013-04-05 – 2013-04-06 (×3): 2 via ORAL
  Filled 2013-04-05 (×3): qty 2

## 2013-04-05 MED ORDER — 0.9 % SODIUM CHLORIDE (POUR BTL) OPTIME
TOPICAL | Status: DC | PRN
  Administered 2013-04-05: 1000 mL

## 2013-04-05 MED ORDER — IRBESARTAN 300 MG PO TABS
300.0000 mg | ORAL_TABLET | Freq: Every day | ORAL | Status: DC
  Filled 2013-04-05: qty 1

## 2013-04-05 MED ORDER — NEOSTIGMINE METHYLSULFATE 1 MG/ML IJ SOLN
INTRAMUSCULAR | Status: DC | PRN
  Administered 2013-04-05: 3 mg via INTRAVENOUS

## 2013-04-05 MED ORDER — DOXYCYCLINE MONOHYDRATE 100 MG PO TABS: 100.0000 mg | ORAL_TABLET | Freq: Two times a day (BID) | ORAL | Status: DC

## 2013-04-05 MED ORDER — ACETAMINOPHEN 650 MG RE SUPP: 650.0000 mg | RECTAL | Status: DC | PRN

## 2013-04-05 MED ORDER — METHOCARBAMOL 500 MG PO TABS
500.0000 mg | ORAL_TABLET | Freq: Four times a day (QID) | ORAL | Status: DC | PRN
  Administered 2013-04-05: 500 mg via ORAL

## 2013-04-05 SURGICAL SUPPLY — 47 items
ADH SKN CLS LQ APL DERMABOND (GAUZE/BANDAGES/DRESSINGS) ×1
APL SKNCLS STERI-STRIP NONHPOA (GAUZE/BANDAGES/DRESSINGS) ×1
BAG DECANTER FOR FLEXI CONT (MISCELLANEOUS) ×2 IMPLANT
BENZOIN TINCTURE PRP APPL 2/3 (GAUZE/BANDAGES/DRESSINGS) ×2 IMPLANT
BUR MATCHSTICK NEURO 3.0 LAGG (BURR) ×2 IMPLANT
CANISTER SUCT 3000ML (MISCELLANEOUS) ×2 IMPLANT
CONT SPEC 4OZ CLIKSEAL STRL BL (MISCELLANEOUS) ×2 IMPLANT
DERMABOND ADHESIVE PROPEN (GAUZE/BANDAGES/DRESSINGS) ×1
DERMABOND ADVANCED .7 DNX6 (GAUZE/BANDAGES/DRESSINGS) IMPLANT
DRAPE LAPAROTOMY 100X72X124 (DRAPES) ×2 IMPLANT
DRAPE MICROSCOPE ZEISS OPMI (DRAPES) ×2 IMPLANT
DRAPE POUCH INSTRU U-SHP 10X18 (DRAPES) ×2 IMPLANT
DRAPE SURG 17X23 STRL (DRAPES) ×2 IMPLANT
DRESSING TELFA 8X3 (GAUZE/BANDAGES/DRESSINGS) ×2 IMPLANT
DRSG OPSITE 4X5.5 SM (GAUZE/BANDAGES/DRESSINGS) ×2 IMPLANT
DURAPREP 26ML APPLICATOR (WOUND CARE) ×2 IMPLANT
ELECT REM PT RETURN 9FT ADLT (ELECTROSURGICAL) ×2
ELECTRODE REM PT RTRN 9FT ADLT (ELECTROSURGICAL) ×1 IMPLANT
GAUZE SPONGE 4X4 16PLY XRAY LF (GAUZE/BANDAGES/DRESSINGS) IMPLANT
GLOVE BIO SURGEON STRL SZ8 (GLOVE) ×2 IMPLANT
GLOVE ECLIPSE 6.5 STRL STRAW (GLOVE) ×1 IMPLANT
GOWN BRE IMP SLV AUR LG STRL (GOWN DISPOSABLE) IMPLANT
GOWN BRE IMP SLV AUR XL STRL (GOWN DISPOSABLE) ×2 IMPLANT
GOWN STRL REIN 2XL LVL4 (GOWN DISPOSABLE) IMPLANT
GOWN STRL REUS W/ TWL LRG LVL3 (GOWN DISPOSABLE) IMPLANT
GOWN STRL REUS W/TWL LRG LVL3 (GOWN DISPOSABLE) ×2
HEMOSTAT POWDER KIT SURGIFOAM (HEMOSTASIS) IMPLANT
KIT BASIN OR (CUSTOM PROCEDURE TRAY) ×2 IMPLANT
KIT ROOM TURNOVER OR (KITS) ×2 IMPLANT
NDL HYPO 25X1 1.5 SAFETY (NEEDLE) ×1 IMPLANT
NDL SPNL 20GX3.5 QUINCKE YW (NEEDLE) IMPLANT
NEEDLE HYPO 25X1 1.5 SAFETY (NEEDLE) ×2 IMPLANT
NEEDLE SPNL 20GX3.5 QUINCKE YW (NEEDLE) IMPLANT
NS IRRIG 1000ML POUR BTL (IV SOLUTION) ×2 IMPLANT
PACK LAMINECTOMY NEURO (CUSTOM PROCEDURE TRAY) ×2 IMPLANT
PAD ARMBOARD 7.5X6 YLW CONV (MISCELLANEOUS) ×6 IMPLANT
RUBBERBAND STERILE (MISCELLANEOUS) ×4 IMPLANT
SPONGE SURGIFOAM ABS GEL SZ50 (HEMOSTASIS) ×2 IMPLANT
STRIP CLOSURE SKIN 1/2X4 (GAUZE/BANDAGES/DRESSINGS) ×2 IMPLANT
SUT VIC AB 0 CT1 18XCR BRD8 (SUTURE) ×1 IMPLANT
SUT VIC AB 0 CT1 8-18 (SUTURE) ×2
SUT VIC AB 2-0 CP2 18 (SUTURE) ×2 IMPLANT
SUT VIC AB 3-0 SH 8-18 (SUTURE) ×2 IMPLANT
SYR 20ML ECCENTRIC (SYRINGE) ×2 IMPLANT
TOWEL OR 17X24 6PK STRL BLUE (TOWEL DISPOSABLE) ×2 IMPLANT
TOWEL OR 17X26 10 PK STRL BLUE (TOWEL DISPOSABLE) ×2 IMPLANT
WATER STERILE IRR 1000ML POUR (IV SOLUTION) ×2 IMPLANT

## 2013-04-05 NOTE — Op Note (Signed)
04/05/2013  1:01 PM  PATIENT:  [name] [name]  68 y.o. female  PRE-OPERATIVE DIAGNOSIS:  Left L4-5 synovial cyst with left L5 radiculopathy  POST-OPERATIVE DIAGNOSIS:  Same  PROCEDURE:  Left L4-5 hemilaminectomy, medial facetectomy, and foraminotomies followed by resection of left L4-5 synovial cyst utilizing microscopic dissection  SURGEON:  Sherley Bounds, MD  ASSISTANTS: None  ANESTHESIA:   General  EBL: 50 ml  Total I/O In: 700 [I.V.:700] Out: 50 [Blood:50]  BLOOD ADMINISTERED:none  DRAINS: None   SPECIMEN:  No Specimen  INDICATION FOR PROCEDURE: This patient presented with severe left leg pain. MRI showed a large synovial cyst at L4-5 the left compressing the left L5 nerve root. She tried medical management without relief. Recommended a hemi-laminectomy for resection of the cyst. Patient understood the risks, benefits, and alternatives and potential outcomes and wished to proceed.  PROCEDURE DETAILS: The patient was taken to the operating room and after induction of adequate generalized endotracheal anesthesia, the patient was rolled into the prone position on the Wilson frame and all pressure points were padded. The lumbar region was cleaned and then prepped with DuraPrep and draped in the usual sterile fashion. 5 cc of local anesthesia was injected and then a dorsal midline incision was made and carried down to the lumbo sacral fascia. The fascia was opened and the paraspinous musculature was taken down in a subperiosteal fashion to expose L4-5 on the left. Intraoperative x-ray confirmed my level, and then I used a combination of the high-speed drill and the Kerrison punches to perform a hemilaminectomy, medial facetectomy, and foraminotomy at L4-5 on the left. The underlying yellow ligament was opened and removed in a piecemeal fashion to expose an underlying synovial cyst that was deforming the dura and and the nerve root medially. The operating microscope was draped and brought  into the field and microdissection was used to de-tethered the edge of the cyst from the lateral part of the dura. I then worked in the plane between the cyst and the dura to bite away the cyst with a Kerrison punch while undercut the lateral recess. I undercut the lateral recess and dissected down until I was medial to and distal to the pedicle. The nerve root was well decompressed once the entire cyst was removed. We then gently retracted the nerve root medially with a retractor, coagulated the epidural venous vasculature, and inspected the disc space. I then palpated with a coronary dilator along the nerve root and into the foramen to assure adequate decompression. I felt no more compression of the nerve root. I irrigated with saline solution containing bacitracin. Achieved hemostasis with bipolar cautery, lined the dura with Gelfoam, and then closed the fascia with 0 Vicryl. I closed the subcutaneous tissues with 2-0 Vicryl and the subcuticular tissues with 3-0 Vicryl. The skin was then closed with benzoin and Steri-Strips. The drapes were removed, a sterile dressing was applied. The patient was awakened from general anesthesia and transferred to the recovery room in stable condition. At the end of the procedure all sponge, needle and instrument counts were correct.   PLAN OF CARE: Admit for overnight observation  PATIENT DISPOSITION:  PACU - hemodynamically stable.   Delay start of Pharmacological VTE agent (>24hrs) due to surgical blood loss or risk of bleeding:  yes

## 2013-04-05 NOTE — H&P (Signed)
Subjective: Patient is a 68 y.o. female admitted for left L4-5 hemilaminectomy for synovial cyst. Onset of symptoms was several months ago, gradually worsening since that time.  The pain is rated severe, and is located at the across the lower back and radiates to left leg. The pain is described as aching and occurs intermittently. The symptoms have been progressive. Symptoms are exacerbated by exercise and standing. MRI or CT showed synovial cyst at L4-5 the left with multilevel spondylosis.   Past Medical History  Diagnosis Date  . Sleep apnea     no cpap used  . Hemorrhoid   . Hypertension     takes Amlodipine and Diovan daily  . Hyperlipidemia     takes Pravastatin daily  . Sleep apnea     study done 20+yrs ago and doesn't use cpap  . History of bronchitis     couple of yrs ago  . Weakness     in left leg r/t back  . GERD (gastroesophageal reflux disease)     was on Nexium   . History of colon polyps   . Urinary urgency   . Cataracts, bilateral     immature    Past Surgical History  Procedure Laterality Date  . Nissen fundoplication    . Colonoscopy with propofol  12/29/2011    Procedure: COLONOSCOPY WITH PROPOFOL;  Surgeon: Lear Ng, MD;  Location: [facility] ENDOSCOPY;  Service: Endoscopy;  Laterality: N/A;  . Hot hemostasis  12/29/2011    Procedure: HOT HEMOSTASIS (ARGON PLASMA COAGULATION/BICAP);  Surgeon: Lear Ng, MD;  Location: [facility];  Service: Endoscopy;  Laterality: N/A;  . Cholecystectomy  1975  . Abdominal hysterectomy  1980    partial  . Heel spurs removed Bilateral   . Leg surgery d/t break Left   . Colonoscopy    . Esophagogastroduodenoscopy      Prior to Admission medications   Medication Sig Start Date End Date Taking? Authorizing Provider  acetaminophen (TYLENOL) 500 MG tablet Take 500 mg by mouth every 6 (six) hours as needed. For pain   Yes Historical Provider, MD  amLODipine (NORVASC) 10 MG tablet Take 10 mg by mouth every  morning.   Yes Historical Provider, MD  aspirin EC 81 MG tablet Take 81 mg by mouth daily.   Yes Historical Provider, MD  calcium carbonate (OS-CAL) 600 MG TABS tablet Take 1,200 mg by mouth daily with breakfast.   Yes Historical Provider, MD  Cholecalciferol (VITAMIN D) 2000 UNITS tablet Take 4,000 Units by mouth daily.   Yes Historical Provider, MD  doxycycline (ADOXA) 100 MG tablet Take 100 mg by mouth 2 (two) times daily. FOR 30 DAYS  Started 03/16/13   Yes Historical Provider, MD  Multiple Vitamin (MULTIVITAMIN WITH MINERALS) TABS Take 1 tablet by mouth daily.   Yes Historical Provider, MD  mupirocin ointment (BACTROBAN) 2 % Place 1 application into the nose 2 (two) times daily.   Yes Historical Provider, MD  traMADol (ULTRAM) 50 MG tablet Take 25 mg by mouth every 4 (four) hours as needed for moderate pain.   Yes Historical Provider, MD  valsartan-hydrochlorothiazide (DIOVAN-HCT) 320-25 MG per tablet Take 1 tablet by mouth every morning.   Yes Historical Provider, MD  vitamin B-12 (CYANOCOBALAMIN) 1000 MCG tablet Take 1,000 mcg by mouth daily.   Yes Historical Provider, MD  pravastatin (PRAVACHOL) 40 MG tablet Take 40 mg by mouth at bedtime.    Historical Provider, MD   Allergies  Allergen Reactions  .  Lexapro [Escitalopram]     Makes her feel funny  . Zoloft [Sertraline Hcl]     Makes her feel funny    History  Substance Use Topics  . Smoking status: Never Smoker   . Smokeless tobacco: Never Used  . Alcohol Use: No    No family history on file.   Review of Systems  Positive ROS: neg  All other systems have been reviewed and were otherwise negative with the exception of those mentioned in the HPI and as above.  Objective: Vital signs in last 24 hours: Temp:  [97.3 F (36.3 C)] 97.3 F (36.3 C) (03/25 1100) Pulse Rate:  [81] 81 (03/25 1100) Resp:  [20] 20 (03/25 1100) BP: (151)/(61) 151/61 mmHg (03/25 1100) SpO2:  [97 %] 97 % (03/25 1100) Weight:  [108.863 kg (240 lb)]  108.863 kg (240 lb) (03/25 1100)  General Appearance: Alert, cooperative, no distress, appears stated age Head: Normocephalic, without obvious abnormality, atraumatic Eyes: PERRL, conjunctiva/corneas clear, EOM's intact    Neck: Supple, symmetrical, trachea midline Back: Symmetric, no curvature, ROM normal, no CVA tenderness Lungs:  respirations unlabored Heart: Regular rate and rhythm Abdomen: Soft, non-tender Extremities: Extremities normal, atraumatic, no cyanosis or edema Pulses: 2+ and symmetric all extremities Skin: Skin color, texture, turgor normal, no rashes or lesions  NEUROLOGIC:   Mental status: Alert and oriented x4,  no aphasia, good attention span, fund of knowledge, and memory Motor Exam - grossly normal Sensory Exam - grossly normal Reflexes: 1+ Coordination - grossly normal Gait - grossly normal Balance - grossly normal Cranial Nerves: I: smell Not tested  II: visual acuity  OS: nl    OD: nl  II: visual fields Full to confrontation  II: pupils Equal, round, reactive to light  III,VII: ptosis None  III,IV,VI: extraocular muscles  Full ROM  V: mastication Normal  V: facial light touch sensation  Normal  V,VII: corneal reflex  Present  VII: facial muscle function - upper  Normal  VII: facial muscle function - lower Normal  VIII: hearing Not tested  IX: soft palate elevation  Normal  IX,X: gag reflex Present  XI: trapezius strength  5/5  XI: sternocleidomastoid strength 5/5  XI: neck flexion strength  5/5  XII: tongue strength  Normal    Data Review Lab Results  Component Value Date   WBC 7.9 03/28/2013   HGB 14.8 03/28/2013   HCT 42.3 03/28/2013   MCV 90.0 03/28/2013   PLT 216 03/28/2013   Lab Results  Component Value Date   NA 143 03/28/2013   K 3.6* 03/28/2013   CL 99 03/28/2013   CO2 30 03/28/2013   BUN 21 03/28/2013   CREATININE 0.97 03/28/2013   GLUCOSE 115* 03/28/2013   Lab Results  Component Value Date   INR 0.93 03/28/2013     Assessment/Plan: Patient admitted for left L4-5 hemilaminectomy for synovial cyst. Patient has failed a reasonable attempt at conservative therapy.  I explained the condition and procedure to the patient and answered any questions.  Patient wishes to proceed with procedure as planned. Understands risks/ benefits and typical outcomes of procedure.   Nyzier Boivin S 04/05/2013 11:38 AM

## 2013-04-05 NOTE — Preoperative (Signed)
Beta Blockers   Reason not to administer Beta Blockers:Not Applicable 

## 2013-04-05 NOTE — Anesthesia Postprocedure Evaluation (Signed)
  Anesthesia Post-op Note  Patient: [name] [name]  Procedure(s) Performed: Procedure(s): LUMBAR FOUR TO FIVE LUMBAR LAMINECTOMY/DECOMPRESSION MICRODISCECTOMY 1 LEVEL (Left)  Patient Location: PACU  Anesthesia Type:General  Level of Consciousness: awake, alert , oriented and patient cooperative  Airway and Oxygen Therapy: Patient Spontanous Breathing and Patient connected to nasal cannula oxygen  Post-op Pain: 2 /10, mild  Post-op Assessment: Post-op Vital signs reviewed, Patient's Cardiovascular Status Stable, Respiratory Function Stable, Patent Airway, No signs of Nausea or vomiting and Pain level controlled  Post-op Vital Signs: Reviewed and stable  Complications: No apparent anesthesia complications

## 2013-04-05 NOTE — Anesthesia Preprocedure Evaluation (Addendum)
Anesthesia Evaluation  Patient identified by MRN, date of birth, ID band Patient awake    Reviewed: Allergy & Precautions, H&P , NPO status , Patient's Chart, lab work & pertinent test results  History of Anesthesia Complications Negative for: history of anesthetic complications  Airway Mallampati: I TM Distance: >3 FB Neck ROM: Full    Dental  (+) Teeth Intact, Dental Advisory Given   Pulmonary sleep apnea ,  breath sounds clear to auscultation  Pulmonary exam normal       Cardiovascular hypertension, Pt. on medications - anginaRhythm:Regular Rate:Normal     Neuro/Psych Chronic back pain    GI/Hepatic Neg liver ROS, GERD-  Medicated and Controlled,  Endo/Other  Morbid obesity  Renal/GU negative Renal ROS     Musculoskeletal   Abdominal (+) + obese,   Peds  Hematology   Anesthesia Other Findings   Reproductive/Obstetrics                         Anesthesia Physical Anesthesia Plan  ASA: III  Anesthesia Plan: General   Post-op Pain Management:    Induction: Intravenous  Airway Management Planned: Oral ETT  Additional Equipment:   Intra-op Plan:   Post-operative Plan: Extubation in OR  Informed Consent: I have reviewed the patients History and Physical, chart, labs and discussed the procedure including the risks, benefits and alternatives for the proposed anesthesia with the patient or authorized representative who has indicated his/her understanding and acceptance.   Dental advisory given  Plan Discussed with: Anesthesiologist, Surgeon and CRNA  Anesthesia Plan Comments: (Plan routine monitors, GETA)       Anesthesia Quick Evaluation

## 2013-04-05 NOTE — Transfer of Care (Signed)
Immediate Anesthesia Transfer of Care Note  Patient: [name] [name]  Procedure(s) Performed: Procedure(s): LUMBAR FOUR TO FIVE LUMBAR LAMINECTOMY/DECOMPRESSION MICRODISCECTOMY 1 LEVEL (Left)  Patient Location: PACU  Anesthesia Type:General  Level of Consciousness: awake, alert  and oriented  Airway & Oxygen Therapy: Patient connected to face mask oxygen  Post-op Assessment: Report given to PACU RN  Post vital signs: stable  Complications: No apparent anesthesia complications

## 2013-04-05 NOTE — Anesthesia Procedure Notes (Signed)
Procedure Name: Intubation Date/Time: 04/05/2013 11:55 AM Performed by: Manuela Schwartz B Pre-anesthesia Checklist: Patient identified, Emergency Drugs available, Suction available, Patient being monitored and Timeout performed Patient Re-evaluated:Patient Re-evaluated prior to inductionOxygen Delivery Method: Circle system utilized Preoxygenation: Pre-oxygenation with 100% oxygen Intubation Type: IV induction Ventilation: Mask ventilation without difficulty Laryngoscope Size: Mac and 3 Grade View: Grade I Tube type: Oral Tube size: 7.5 mm Number of attempts: 1 Airway Equipment and Method: Stylet Placement Confirmation: ETT inserted through vocal cords under direct vision,  positive ETCO2 and breath sounds checked- equal and bilateral Secured at: 21 cm Tube secured with: Tape Dental Injury: Teeth and Oropharynx as per pre-operative assessment

## 2013-04-06 ENCOUNTER — Encounter (HOSPITAL_COMMUNITY): Payer: Self-pay | Admitting: Neurological Surgery

## 2013-04-06 MED ORDER — TRAMADOL HCL 50 MG PO TABS: 50.0000 mg | ORAL_TABLET | Freq: Four times a day (QID) | ORAL | Status: DC | PRN

## 2013-04-06 NOTE — Progress Notes (Signed)
  Pt. Alert and oriented,follows simple instructions, denies pain. Incision area without swelling, redness or S/S of infection. Voiding adequate clear yellow urine. Moving all extremities well and vitals stable and documented. Patient discharged home with spouse. Lumbar surgery notes instructions given to patient and family member for home safety and precautions. Pt. and family stated understanding of instructions given.  

## 2013-04-06 NOTE — Discharge Summary (Signed)
Physician Discharge Summary  Patient ID: [name] [name] MRN: [id number] DOB/AGE: May 17, 1945 68 y.o.  Admit date: 04/05/2013 Discharge date: 04/06/2013  Admission Diagnoses: L L4-5 synovial cyst   Discharge Diagnoses: same   Discharged Condition: good  Hospital Course: The patient was admitted on 04/05/2013 and taken to the operating room where the patient underwent LL for synovial cyst. The patient tolerated the procedure well and was taken to the recovery room and then to the floor in stable condition. The hospital course was routine. There were no complications. The wound remained clean dry and intact. Pt had appropriate back soreness. No complaints of leg pain or new N/T/W. The patient remained afebrile with stable vital signs, and tolerated a regular diet. The patient continued to increase activities, and pain was well controlled with oral pain medications.   Consults: none  Significant Diagnostic Studies:  Results for orders placed during the hospital encounter of 10/62/69  BASIC METABOLIC PANEL      Result Value Ref Range   Sodium 143  137 - 147 mEq/L   Potassium 3.6 (*) 3.7 - 5.3 mEq/L   Chloride 99  96 - 112 mEq/L   CO2 30  19 - 32 mEq/L   Glucose, Bld 115 (*) 70 - 99 mg/dL   BUN 21  6 - 23 mg/dL   Creatinine, Ser 0.97  0.50 - 1.10 mg/dL   Calcium 9.6  8.4 - 10.5 mg/dL   GFR calc non Af Amer 59 (*) >90 mL/min   GFR calc Af Amer 69 (*) >90 mL/min  CBC WITH DIFFERENTIAL      Result Value Ref Range   WBC 7.9  4.0 - 10.5 K/uL   RBC 4.70  3.87 - 5.11 MIL/uL   Hemoglobin 14.8  12.0 - 15.0 g/dL   HCT 42.3  36.0 - 46.0 %   MCV 90.0  78.0 - 100.0 fL   MCH 31.5  26.0 - 34.0 pg   MCHC 35.0  30.0 - 36.0 g/dL   RDW 13.4  11.5 - 15.5 %   Platelets 216  150 - 400 K/uL   Neutrophils Relative % 37 (*) 43 - 77 %   Neutro Abs 2.9  1.7 - 7.7 K/uL   Lymphocytes Relative 48 (*) 12 - 46 %   Lymphs Abs 3.8  0.7 - 4.0 K/uL   Monocytes Relative 10  3 - 12 %   Monocytes Absolute 0.8  0.1 -  1.0 K/uL   Eosinophils Relative 5  0 - 5 %   Eosinophils Absolute 0.4  0.0 - 0.7 K/uL   Basophils Relative 1  0 - 1 %   Basophils Absolute 0.1  0.0 - 0.1 K/uL  PROTIME-INR      Result Value Ref Range   Prothrombin Time 12.3  11.6 - 15.2 seconds   INR 0.93  0.00 - 1.49    Chest 2 View  03/28/2013   CLINICAL DATA:  68 year old female for preoperative respiratory examination prior to lumbar laminectomy microdiscectomy.  EXAM: CHEST  2 VIEW  COMPARISON:  12/23/2008  FINDINGS: The cardiomediastinal silhouette is unremarkable.  Mild left basilar scarring noted.  There is no evidence of focal airspace disease, pulmonary edema, suspicious pulmonary nodule/mass, pleural effusion, or pneumothorax. No acute bony abnormalities are identified.  IMPRESSION: No active cardiopulmonary disease.   Electronically Signed   By: Hassan Rowan M.D.   On: 03/28/2013 09:58   Dg Lumbar Spine 1 View  04/05/2013   CLINICAL DATA:  Left  L4-L5 laminectomy  EXAM: LUMBAR SPINE - 1 VIEW  COMPARISON:  MRI of the lumbar spine dated March 05, 2013  FINDINGS: Numbering is in accordance with the MRI. The metallic localization device lies posterior to the L4-L5 disc space. There is no high-grade disc space narrowing. Alignment is normal.  IMPRESSION: The metallic localization device lies posterior to the L4-L5 disc space overlying the inferior aspect of the L4 inferior articular facet.   Electronically Signed   By: Kaoir Loree  [place]   On: 04/05/2013 14:41    Antibiotics:  Anti-infectives   Start     Dose/Rate Route Frequency Ordered Stop   04/05/13 2200  doxycycline (VIBRA-TABS) tablet 100 mg     100 mg Oral Every 12 hours 04/05/13 1456     04/05/13 2000  ceFAZolin (ANCEF) IVPB 1 g/50 mL premix     1 g 100 mL/hr over 30 Minutes Intravenous Every 8 hours 04/05/13 1421 04/06/13 0455   04/05/13 1430  doxycycline (ADOXA) tablet 100 mg  Status:  Discontinued     100 mg Oral 2 times daily 04/05/13 1421 04/05/13 1455   04/05/13 1230   bacitracin 50,000 Units in sodium chloride irrigation 0.9 % 500 mL irrigation  Status:  Discontinued       As needed 04/05/13 1230 04/05/13 1300   04/05/13 0600  ceFAZolin (ANCEF) IVPB 2 g/50 mL premix     2 g 100 mL/hr over 30 Minutes Intravenous On call to O.R. 04/04/13 1415 04/05/13 1204      Discharge Exam: Blood pressure 102/64, pulse 74, temperature 98.3 F (36.8 C), temperature source Oral, resp. rate 20, weight 108.863 kg (240 lb), SpO2 95.00%. Good strength, walking well Incision ok  Discharge Medications:     Medication List    ASK your doctor about these medications       acetaminophen 500 MG tablet  Commonly known as:  TYLENOL  Take 500 mg by mouth every 6 (six) hours as needed. For pain     amLODipine 10 MG tablet  Commonly known as:  NORVASC  Take 10 mg by mouth every morning.     aspirin EC 81 MG tablet  Take 81 mg by mouth daily.     calcium carbonate 600 MG Tabs tablet  Commonly known as:  OS-CAL  Take 1,200 mg by mouth daily with breakfast.     doxycycline 100 MG tablet  Commonly known as:  ADOXA  Take 100 mg by mouth 2 (two) times daily. FOR 30 DAYS  Started 03/16/13     multivitamin with minerals Tabs tablet  Take 1 tablet by mouth daily.     mupirocin ointment 2 %  Commonly known as:  BACTROBAN  Place 1 application into the nose 2 (two) times daily.     pravastatin 40 MG tablet  Commonly known as:  PRAVACHOL  Take 40 mg by mouth at bedtime.     traMADol 50 MG tablet  Commonly known as:  ULTRAM  Take 25 mg by mouth every 4 (four) hours as needed for moderate pain.     valsartan-hydrochlorothiazide 320-25 MG per tablet  Commonly known as:  DIOVAN-HCT  Take 1 tablet by mouth every morning.     vitamin B-12 1000 MCG tablet  Commonly known as:  CYANOCOBALAMIN  Take 1,000 mcg by mouth daily.     Vitamin D 2000 UNITS tablet  Take 4,000 Units by mouth daily.        Disposition: home   Final Dx: LL  for synovial cyst         Follow-up Information   Follow up with Khizar Fiorella S, MD In 2 weeks.   Specialty:  Neurosurgery   Contact information:   [address] [place] [address] [phone]        Signed: Eustace Moore 04/06/2013, 6:39 AM

## 2013-05-16 ENCOUNTER — Encounter (HOSPITAL_BASED_OUTPATIENT_CLINIC_OR_DEPARTMENT_OTHER): Payer: Medicare HMO | Attending: General Surgery

## 2013-05-16 DIAGNOSIS — T8189XA Other complications of procedures, not elsewhere classified, initial encounter: Secondary | ICD-10-CM | POA: Insufficient documentation

## 2013-05-16 DIAGNOSIS — Y839 Surgical procedure, unspecified as the cause of abnormal reaction of the patient, or of later complication, without mention of misadventure at the time of the procedure: Secondary | ICD-10-CM | POA: Insufficient documentation

## 2013-05-16 DIAGNOSIS — I10 Essential (primary) hypertension: Secondary | ICD-10-CM | POA: Insufficient documentation

## 2013-05-16 DIAGNOSIS — Z79899 Other long term (current) drug therapy: Secondary | ICD-10-CM | POA: Insufficient documentation

## 2013-05-16 DIAGNOSIS — Z7982 Long term (current) use of aspirin: Secondary | ICD-10-CM | POA: Insufficient documentation

## 2013-05-17 NOTE — H&P (Signed)
[name], [name] NO.:  [id number]  MEDICAL RECORD NO.:  [id number]  LOCATION:  FOOT                         FACILITY:  [facility]  PHYSICIAN:  Elesa Hacker, M.D.        DATE OF BIRTH:  1945-08-13  DATE OF ADMISSION:  05/16/2013 DATE OF DISCHARGE:                             HISTORY & PHYSICAL   CHIEF COMPLAINT:  Wound on the back.  HISTORY OF PRESENT ILLNESS:  This 68 year old female underwent neurosurgery for her back at the beginning of April.  After a week the wound broke open and has been treated with Polysporin first which caused a rash, and since been treated with other antibiotic ointments but has failed to heal.  PAST MEDICAL HISTORY: 1. Hypertension. 2. Cataracts. 3. Lumbar surgical disease.  PAST SURGICAL HISTORY:  Gallbladder removal, hysterectomy, and back surgery.  SOCIAL HISTORY:  Cigarettes none.  Alcohol none.  ALLERGIES:  LEXAPRO, ZOLOFT, POLYSPORIN.  MEDICATIONS:  Tylenol, aspirin, Os-Cal, vitamin D, vitamin B12, Norvasc, Pravachol, and Diovan.  REVIEW OF SYSTEMS:  As above.  PHYSICAL EXAMINATION:  VITAL SIGNS:  Temperature 97.9, pulse 71, respirations 16, blood pressure 124/74. GENERAL APPEARANCE:  Well developed, slightly obese, no distress. CHEST:  Clear. HEART:  Regular rhythm. EXTREMITIES:  Examination of the back reveals a 1.2 x 0.2 x 0.2 wound in the mid back in the midst of the surgical incision.  IMPRESSION:  Unhealed surgical wound.  TREATMENT:  Under local anesthesia, the edges of the wound were excised down to and including the base.  This was a very chronic fibrous tissue. The resulting wound is now 1.2 x 0.5 x 0.2.  We are treating with silver collagen every other day, and we will see her in 7 days.     Elesa Hacker, M.D.     RA/MEDQ  D:  05/16/2013  T:  05/17/2013  Job:  [id number]  cc:   Eustace Moore, MD

## 2013-06-13 ENCOUNTER — Encounter (HOSPITAL_BASED_OUTPATIENT_CLINIC_OR_DEPARTMENT_OTHER): Payer: Medicare HMO | Attending: General Surgery

## 2013-06-13 DIAGNOSIS — T8189XA Other complications of procedures, not elsewhere classified, initial encounter: Secondary | ICD-10-CM | POA: Insufficient documentation

## 2013-06-13 DIAGNOSIS — Y838 Other surgical procedures as the cause of abnormal reaction of the patient, or of later complication, without mention of misadventure at the time of the procedure: Secondary | ICD-10-CM | POA: Insufficient documentation

## 2013-07-18 ENCOUNTER — Encounter (HOSPITAL_BASED_OUTPATIENT_CLINIC_OR_DEPARTMENT_OTHER): Payer: PRIVATE HEALTH INSURANCE | Attending: General Surgery

## 2013-07-26 ENCOUNTER — Other Ambulatory Visit: Payer: Self-pay

## 2013-07-26 DIAGNOSIS — Z1231 Encounter for screening mammogram for malignant neoplasm of breast: Secondary | ICD-10-CM

## 2013-07-31 ENCOUNTER — Ambulatory Visit: Payer: PRIVATE HEALTH INSURANCE

## 2013-08-01 ENCOUNTER — Ambulatory Visit
Admission: RE | Admit: 2013-08-01 | Discharge: 2013-08-01 | Disposition: A | Discharge status: Home / Self Care | Payer: Medicare HMO | Source: Ambulatory Visit

## 2013-08-01 DIAGNOSIS — Z1231 Encounter for screening mammogram for malignant neoplasm of breast: Secondary | ICD-10-CM

## 2014-05-22 ENCOUNTER — Other Ambulatory Visit: Payer: Self-pay | Admitting: Gastroenterology

## 2014-05-22 NOTE — Addendum Note (Signed)
Addended by: Wilford Corner on: 05/22/2014 01:32 PM   Modules accepted: Orders

## 2014-05-23 ENCOUNTER — Encounter (HOSPITAL_COMMUNITY): Payer: Self-pay | Admitting: *Deleted

## 2014-05-23 NOTE — Anesthesia Preprocedure Evaluation (Addendum)
Anesthesia Evaluation  Patient identified by MRN, date of birth, ID band Patient awake    Reviewed: Allergy & Precautions, Patient's Chart, lab work & pertinent test results  History of Anesthesia Complications Negative for: history of anesthetic complications  Airway Mallampati: II  TM Distance: >3 FB Neck ROM: Full    Dental  (+) Caps, Dental Advisory Given   Pulmonary neg pulmonary ROS,    Pulmonary exam normal       Cardiovascular hypertension, Pt. on medications Normal cardiovascular exam    Neuro/Psych PSYCHIATRIC DISORDERS Anxiety Depression negative neurological ROS     GI/Hepatic Neg liver ROS, GERD-  ,  Endo/Other  negative endocrine ROS  Renal/GU negative Renal ROS     Musculoskeletal   Abdominal   Peds  Hematology   Anesthesia Other Findings   Reproductive/Obstetrics                            Anesthesia Physical Anesthesia Plan  ASA: II  Anesthesia Plan: MAC   Post-op Pain Management:    Induction: Intravenous  Airway Management Planned: Simple Face Mask  Additional Equipment:   Intra-op Plan:   Post-operative Plan:   Informed Consent: I have reviewed the patients History and Physical, chart, labs and discussed the procedure including the risks, benefits and alternatives for the proposed anesthesia with the patient or authorized representative who has indicated his/her understanding and acceptance.   Dental advisory given  Plan Discussed with: CRNA, Anesthesiologist and Surgeon  Anesthesia Plan Comments:         Anesthesia Quick Evaluation

## 2014-05-24 ENCOUNTER — Ambulatory Visit (HOSPITAL_COMMUNITY): Payer: Medicare Other | Admitting: Anesthesiology

## 2014-05-24 ENCOUNTER — Encounter (HOSPITAL_COMMUNITY): Payer: Self-pay | Admitting: *Deleted

## 2014-05-24 ENCOUNTER — Encounter (HOSPITAL_COMMUNITY)
Admission: RE | Disposition: A | Discharge status: Home / Self Care | Payer: Self-pay | Source: Ambulatory Visit | Attending: Gastroenterology

## 2014-05-24 ENCOUNTER — Ambulatory Visit (HOSPITAL_COMMUNITY)
Admission: RE | Admit: 2014-05-24 | Discharge: 2014-05-24 | Disposition: A | Discharge status: Home / Self Care | Payer: Medicare Other | Source: Ambulatory Visit | Attending: Gastroenterology | Admitting: Gastroenterology

## 2014-05-24 DIAGNOSIS — Z6835 Body mass index (BMI) 35.0-35.9, adult: Secondary | ICD-10-CM | POA: Diagnosis not present

## 2014-05-24 DIAGNOSIS — M8580 Other specified disorders of bone density and structure, unspecified site: Secondary | ICD-10-CM | POA: Diagnosis not present

## 2014-05-24 DIAGNOSIS — Z9049 Acquired absence of other specified parts of digestive tract: Secondary | ICD-10-CM | POA: Insufficient documentation

## 2014-05-24 DIAGNOSIS — K648 Other hemorrhoids: Secondary | ICD-10-CM | POA: Insufficient documentation

## 2014-05-24 DIAGNOSIS — Z09 Encounter for follow-up examination after completed treatment for conditions other than malignant neoplasm: Secondary | ICD-10-CM | POA: Diagnosis present

## 2014-05-24 DIAGNOSIS — Z860100 Personal history of colon polyps, unspecified: Secondary | ICD-10-CM

## 2014-05-24 DIAGNOSIS — Z79899 Other long term (current) drug therapy: Secondary | ICD-10-CM | POA: Insufficient documentation

## 2014-05-24 DIAGNOSIS — Z800 Family history of malignant neoplasm of digestive organs: Secondary | ICD-10-CM | POA: Diagnosis not present

## 2014-05-24 DIAGNOSIS — I129 Hypertensive chronic kidney disease with stage 1 through stage 4 chronic kidney disease, or unspecified chronic kidney disease: Secondary | ICD-10-CM | POA: Insufficient documentation

## 2014-05-24 DIAGNOSIS — N189 Chronic kidney disease, unspecified: Secondary | ICD-10-CM | POA: Insufficient documentation

## 2014-05-24 DIAGNOSIS — G4730 Sleep apnea, unspecified: Secondary | ICD-10-CM | POA: Insufficient documentation

## 2014-05-24 HISTORY — PX: HOT HEMOSTASIS: SHX5433

## 2014-05-24 HISTORY — DX: Personal history of colonic polyps: Z86.010

## 2014-05-24 HISTORY — DX: Major depressive disorder, single episode, unspecified: F32.9

## 2014-05-24 HISTORY — PX: COLONOSCOPY WITH PROPOFOL: SHX5780

## 2014-05-24 HISTORY — DX: Anxiety disorder, unspecified: F41.9

## 2014-05-24 HISTORY — DX: Unspecified osteoarthritis, unspecified site: M19.90

## 2014-05-24 HISTORY — DX: Depression, unspecified: F32.A

## 2014-05-24 HISTORY — DX: Personal history of colon polyps, unspecified: Z86.0100

## 2014-05-24 SURGERY — COLONOSCOPY WITH PROPOFOL
Anesthesia: Monitor Anesthesia Care

## 2014-05-24 MED ORDER — PROMETHAZINE HCL 25 MG/ML IJ SOLN: 6.2500 mg | INTRAMUSCULAR | Status: DC | PRN

## 2014-05-24 MED ORDER — PROPOFOL 10 MG/ML IV BOLUS
INTRAVENOUS | Status: DC | PRN
  Administered 2014-05-24 (×2): 50 mg via INTRAVENOUS

## 2014-05-24 MED ORDER — LIDOCAINE HCL (CARDIAC) 20 MG/ML IV SOLN
INTRAVENOUS | Status: DC | PRN
  Administered 2014-05-24: 50 mg via INTRAVENOUS

## 2014-05-24 MED ORDER — SODIUM CHLORIDE 0.9 % IV SOLN: INTRAVENOUS | Status: DC

## 2014-05-24 MED ORDER — LACTATED RINGERS IV SOLN
INTRAVENOUS | Status: DC
  Administered 2014-05-24: 1000 mL via INTRAVENOUS

## 2014-05-24 MED ORDER — LIDOCAINE HCL (CARDIAC) 20 MG/ML IV SOLN
INTRAVENOUS | Status: AC
  Filled 2014-05-24: qty 5

## 2014-05-24 MED ORDER — HYDROMORPHONE HCL 1 MG/ML IJ SOLN: 0.2500 mg | INTRAMUSCULAR | Status: DC | PRN

## 2014-05-24 MED ORDER — PROPOFOL 10 MG/ML IV BOLUS
INTRAVENOUS | Status: AC
  Filled 2014-05-24: qty 20

## 2014-05-24 MED ORDER — PROPOFOL INFUSION 10 MG/ML OPTIME
INTRAVENOUS | Status: DC | PRN
  Administered 2014-05-24: 140 ug/kg/min via INTRAVENOUS

## 2014-05-24 SURGICAL SUPPLY — 22 items

## 2014-05-24 NOTE — H&P (Signed)
  [facility] Gastroenterology Procedure Note   [name] [name] 69 y.o. 01-31-1945   Subjective: 69 yo with history of 3 adenomatous polyps removed in 12/2011 returns for surveillance colonoscopy. Denies any diarrhea, rectal bleeding, or abdominal pain.  PMH: Morbid Obesity Sleep Apnea Depression GERD Hypercholesterolemia CKD Osteopenia HTN  PSH: Cholecystectomy Nissen fundoplication D & C Left knee surgery Lumbar back surgery Foot surgery  Meds: see MAR  Allergies: Cymbalta, Zocor, Lexapro, Neosporin  FHx: Mother - colon cancer (age 80); No family history of polyps  SH: Denies alcohol, tobacco, or drugs  Objective: Vital signs in last 24 hours: Filed Vitals:   05/24/14 0839  BP: 149/55  Pulse: 76  Temp: 98.3 F (36.8 C)  Resp: 16    Physical Exam: Gen: alert, no acute distress, pleasant, obese HEENT: anicteric CV: RRR Chest: CTA B Abd: soft, nontender, nondistended, +BS Ext: no edema Neuro: alert, oriented Skin: no rash  Lab Results: No results for input(s): NA, K, CL, CO2, GLUCOSE, BUN, CREATININE, CALCIUM, MG, PHOS in the last 72 hours. No results for input(s): AST, ALT, ALKPHOS, BILITOT, PROT, ALBUMIN in the last 72 hours. No results for input(s): WBC, NEUTROABS, HGB, HCT, MCV, PLT in the last 72 hours. No results for input(s): LABPROT, INR in the last 72 hours.    Assessment/Plan: 69 yo here for a surveillance colonoscopy due to a history of adenomatous polyps. Risks/benefits of the procedure discussed and she agrees to proceed.   [place] C. 05/24/2014, 9:41 AM  Pager [phone]  If no answer or after 5 PM call [phone]

## 2014-05-24 NOTE — Anesthesia Postprocedure Evaluation (Signed)
Anesthesia Post Note  Patient: [name] [name]  Procedure(s) Performed: Procedure(s) (LRB): COLONOSCOPY WITH PROPOFOL (N/A) HOT HEMOSTASIS (ARGON PLASMA COAGULATION/BICAP) (N/A)  Anesthesia type: general  Patient location: PACU  Post pain: Pain level controlled  Post assessment: Patient's Cardiovascular Status Stable  Last Vitals:  Filed Vitals:   05/24/14 1038  BP: 136/67  Pulse:   Temp:   Resp: 21    Post vital signs: Reviewed and stable  Level of consciousness: sedated  Complications: No apparent anesthesia complications

## 2014-05-24 NOTE — Transfer of Care (Signed)
Immediate Anesthesia Transfer of Care Note  Patient: [name] [name]  Procedure(s) Performed: Procedure(s): COLONOSCOPY WITH PROPOFOL (N/A) HOT HEMOSTASIS (ARGON PLASMA COAGULATION/BICAP) (N/A)  Patient Location: PACU  Anesthesia Type:MAC  Level of Consciousness: awake, alert  and oriented  Airway & Oxygen Therapy: Patient Spontanous Breathing and Patient connected to face mask oxygen  Post-op Assessment: Report given to RN and Post -op Vital signs reviewed and stable  Post vital signs: Reviewed and stable  Last Vitals:  Filed Vitals:   05/24/14 0839  BP: 149/55  Pulse: 76  Temp: 36.8 C  Resp: 16    Complications: No apparent anesthesia complications

## 2014-05-24 NOTE — Op Note (Signed)
[facility] [place], [address]   COLONOSCOPY PROCEDURE REPORT     EXAM DATE: 2014/05/31  PATIENT NAME:      [name] [name], [name] [name]           MR #:      [id number]  BIRTHDATE:       1945/07/13      VISIT #:     [phone]  ATTENDING:     Wilford Corner, MD     STATUS:     outpatient REFERRING MD: ASA CLASS:        Class III  INDICATIONS:  The patient is a 69 yr old female here for a colonoscopy due to follow up of adenomatous colonic polyp(s) and patient's immediate family history of colon cancer. PROCEDURE PERFORMED:     Colonoscopy, surveillance MEDICATIONS:     Monitored anesthesia care and Per Anesthesia  ESTIMATED BLOOD LOSS:     None  CONSENT: The patient understands the risks and benefits of the procedure and understands that these risks include, but are not limited to: sedation, allergic reaction, infection, perforation and/or bleeding. Alternative means of evaluation and treatment include, among others: physical exam, x-rays, and/or surgical intervention. The patient elects to proceed with this endoscopic procedure.  DESCRIPTION OF PROCEDURE: During intra-op preparation period all mechanical & medical equipment was checked for proper function. Hand hygiene and appropriate measures for infection prevention was taken. After the risks, benefits and alternatives of the procedure were thoroughly explained, Informed consent was verified, confirmed and timeout was successfully executed by the treatment team. A digital exam revealed no abnormalities of the rectum.      The Pentax Ped Colon [id number] endoscope was introduced through the anus and advanced to the cecum, which was identified by both the appendix and ileocecal valve. The prep was good.. The instrument was then slowly withdrawn as the colon was fully examined. Estimated blood loss is zero unless otherwise noted in this procedure report. Previously tattooed site in ascending colon  seen and no recurrent polyp seen. No mucosal abnormalities.      Retroflexed views revealed small internal hemorrhoids.  The scope was then completely withdrawn from the patient and the procedure terminated.     ADVERSE EVENTS:      There were no immediate complications.   IMPRESSIONS:     Small internal hemorrhoids otherwise normal colonoscopy  RECOMMENDATIONS:     Repeat surveillance colonoscopy in 5 years    Wilford Corner, MD eSigned:  Wilford Corner, MD 31-May-2014 10:20 AM   cc:  CPT CODES: ICD CODES:  The ICD and CPT codes recommended by this software are interpretations from the data that the clinical staff has captured with the software.  The verification of the translation of this report to the ICD and CPT codes and modifiers is the sole responsibility of the health care institution and practicing physician where this report was generated.  [employer] will not be held responsible for the validity of the ICD and CPT codes included on this report.  AMA assumes no liability for data contained or not contained herein. CPT is a Designer, television/film set of the [employer].  PATIENT NAME:  [name] [name], [name] [name] MR#: [id number]

## 2014-05-24 NOTE — Discharge Instructions (Addendum)
Repeat colonoscopy in 5 years

## 2014-05-24 NOTE — Interval H&P Note (Signed)
History and Physical Interval Note:  05/24/2014 9:47 AM  [name] [name]  has presented today for surgery, with the diagnosis of hx of colon polyps  The various methods of treatment have been discussed with the patient and family. After consideration of risks, benefits and other options for treatment, the patient has consented to  Procedure(s): COLONOSCOPY WITH PROPOFOL (N/A) HOT HEMOSTASIS (ARGON PLASMA COAGULATION/BICAP) (N/A) as a surgical intervention .  The patient's history has been reviewed, patient examined, no change in status, stable for surgery.  I have reviewed the patient's chart and labs.  Questions were answered to the patient's satisfaction.     [place] C.

## 2014-05-25 ENCOUNTER — Encounter (HOSPITAL_COMMUNITY): Payer: Self-pay | Admitting: Gastroenterology

## 2014-06-28 ENCOUNTER — Other Ambulatory Visit: Payer: Self-pay

## 2014-06-28 DIAGNOSIS — Z1231 Encounter for screening mammogram for malignant neoplasm of breast: Secondary | ICD-10-CM

## 2014-07-31 ENCOUNTER — Encounter (HOSPITAL_COMMUNITY): Payer: Self-pay | Admitting: Neurological Surgery

## 2014-08-06 ENCOUNTER — Ambulatory Visit: Payer: Medicare HMO

## 2014-09-20 ENCOUNTER — Ambulatory Visit
Admission: RE | Admit: 2014-09-20 | Discharge: 2014-09-20 | Disposition: A | Discharge status: Home / Self Care | Payer: Medicare Other | Source: Ambulatory Visit

## 2014-09-20 DIAGNOSIS — Z1231 Encounter for screening mammogram for malignant neoplasm of breast: Secondary | ICD-10-CM

## 2015-08-16 ENCOUNTER — Other Ambulatory Visit: Payer: Self-pay | Admitting: Family Medicine

## 2015-08-16 DIAGNOSIS — Z1231 Encounter for screening mammogram for malignant neoplasm of breast: Secondary | ICD-10-CM

## 2015-09-23 ENCOUNTER — Ambulatory Visit
Admission: RE | Admit: 2015-09-23 | Discharge: 2015-09-23 | Disposition: A | Discharge status: Home / Self Care | Payer: Medicare Other | Source: Ambulatory Visit | Attending: Family Medicine | Admitting: Family Medicine

## 2015-09-23 DIAGNOSIS — Z1231 Encounter for screening mammogram for malignant neoplasm of breast: Secondary | ICD-10-CM

## 2015-11-06 DIAGNOSIS — M180 Bilateral primary osteoarthritis of first carpometacarpal joints: Secondary | ICD-10-CM | POA: Insufficient documentation

## 2015-11-06 DIAGNOSIS — M47812 Spondylosis without myelopathy or radiculopathy, cervical region: Secondary | ICD-10-CM | POA: Insufficient documentation

## 2015-11-06 DIAGNOSIS — G5603 Carpal tunnel syndrome, bilateral upper limbs: Secondary | ICD-10-CM | POA: Insufficient documentation

## 2015-11-06 HISTORY — DX: Bilateral primary osteoarthritis of first carpometacarpal joints: M18.0

## 2015-11-06 HISTORY — DX: Carpal tunnel syndrome, bilateral upper limbs: G56.03

## 2015-11-06 HISTORY — DX: Spondylosis without myelopathy or radiculopathy, cervical region: M47.812

## 2015-11-13 ENCOUNTER — Other Ambulatory Visit: Payer: Self-pay | Admitting: Orthopedic Surgery

## 2015-12-17 ENCOUNTER — Ambulatory Visit (HOSPITAL_BASED_OUTPATIENT_CLINIC_OR_DEPARTMENT_OTHER): Admit: 2015-12-17 | Payer: Medicare Other | Admitting: Orthopedic Surgery

## 2015-12-17 ENCOUNTER — Encounter (HOSPITAL_BASED_OUTPATIENT_CLINIC_OR_DEPARTMENT_OTHER): Payer: Self-pay

## 2015-12-17 SURGERY — CARPAL TUNNEL RELEASE
Anesthesia: Regional | Laterality: Left

## 2015-12-24 ENCOUNTER — Ambulatory Visit (INDEPENDENT_AMBULATORY_CARE_PROVIDER_SITE_OTHER): Payer: Self-pay | Admitting: Orthopaedic Surgery

## 2016-01-03 ENCOUNTER — Other Ambulatory Visit: Payer: Self-pay | Admitting: Family Medicine

## 2016-01-03 DIAGNOSIS — M545 Low back pain: Secondary | ICD-10-CM

## 2016-01-17 ENCOUNTER — Ambulatory Visit
Admission: RE | Admit: 2016-01-17 | Discharge: 2016-01-17 | Disposition: A | Discharge status: Home / Self Care | Payer: Medicare Other | Source: Ambulatory Visit | Attending: Family Medicine | Admitting: Family Medicine

## 2016-01-17 ENCOUNTER — Other Ambulatory Visit: Payer: Self-pay | Admitting: Family Medicine

## 2016-01-17 DIAGNOSIS — R059 Cough, unspecified: Secondary | ICD-10-CM

## 2016-01-17 DIAGNOSIS — R05 Cough: Secondary | ICD-10-CM

## 2016-01-17 DIAGNOSIS — M545 Low back pain: Secondary | ICD-10-CM

## 2016-01-17 MED ORDER — GADOBENATE DIMEGLUMINE 529 MG/ML IV SOLN
10.0000 mL | Freq: Once | INTRAVENOUS | Status: AC | PRN
  Administered 2016-01-17: 10 mL via INTRAVENOUS

## 2016-02-14 ENCOUNTER — Ambulatory Visit (INDEPENDENT_AMBULATORY_CARE_PROVIDER_SITE_OTHER): Payer: Medicare Other | Admitting: Pulmonary Disease

## 2016-02-14 ENCOUNTER — Encounter: Payer: Self-pay | Admitting: Pulmonary Disease

## 2016-02-14 VITALS — BP 116/70 | HR 152

## 2016-02-14 DIAGNOSIS — R05 Cough: Secondary | ICD-10-CM

## 2016-02-14 DIAGNOSIS — R059 Cough, unspecified: Secondary | ICD-10-CM

## 2016-02-14 LAB — NITRIC OXIDE: NITRIC OXIDE: 17

## 2016-02-14 MED ORDER — BUDESONIDE-FORMOTEROL FUMARATE 160-4.5 MCG/ACT IN AERO: 2.0000 | INHALATION_SPRAY | Freq: Two times a day (BID) | RESPIRATORY_TRACT | 6 refills | Status: DC

## 2016-02-14 MED ORDER — FAMOTIDINE 40 MG PO TABS: 40.0000 mg | ORAL_TABLET | Freq: Every day | ORAL | 1 refills | Status: DC

## 2016-02-14 MED ORDER — FLUTICASONE PROPIONATE 50 MCG/ACT NA SUSP: 2.0000 | Freq: Every day | NASAL | 5 refills | Status: DC

## 2016-02-14 NOTE — Progress Notes (Signed)
[name] [name]    FM:[id number]    1945-08-18  Primary Care Physician:WHITE,CYNTHIA S, MD  Referring Physician: Harlan Stains, MD [address], [place] [address]  Chief complaint:  Chronic cough  HPI: [name] [name] is a 71 year old with past medical history of hypertension, GERD, hiatal hernia repair, hyperlipidemia, sleep apnea not on CPAP. She has complains of chronic cough for the past 8 weeks. Nonproductive in nature. Associated with occasional wheeze. She had been given prednisone for 2 weeks and a course of doxycycline without any change in symptoms. She started on Pepcid and chlorpheniramine by her primary care physician some improvement in symptoms. She was taking the chlorphentermine 5 mg 3 times a day but has reduced it to once a day. She is also on Symbicort inhaler.   She is a nonsmoker with no known exposures. She has history of GERD s/p hiatal hernia repair in the past.  Outpatient Encounter Prescriptions as of 02/14/2016  Medication Sig  . amLODipine (NORVASC) 10 MG tablet Take 10 mg by mouth every morning.  Marland Kitchen amLODipine (NORVASC) 10 MG tablet Take 10 mg by mouth every morning.  Marland Kitchen aspirin EC 81 MG tablet Take 81 mg by mouth daily.  Marland Kitchen aspirin EC 81 MG tablet Take 81 mg by mouth every morning.  Marland Kitchen buPROPion (WELLBUTRIN XL) 150 MG 24 hr tablet Take 150 mg by mouth every morning.  Marland Kitchen buPROPion (WELLBUTRIN) 100 MG tablet Take 100 mg by mouth every morning.  . calcium carbonate (OS-CAL) 600 MG TABS tablet Take 1,200 mg by mouth daily with breakfast.  . CALCIUM PO Take 1 tablet by mouth every morning.  . Cholecalciferol (VITAMIN D PO) Take 1 tablet by mouth every morning.  . Cholecalciferol (VITAMIN D) 2000 UNITS tablet Take 4,000 Units by mouth daily.  . pravastatin (PRAVACHOL) 40 MG tablet Take 40 mg by mouth at bedtime.  . pravastatin (PRAVACHOL) 40 MG tablet Take 40 mg by mouth at bedtime.  . valsartan-hydrochlorothiazide (DIOVAN-HCT) 320-25 MG per  tablet Take 1 tablet by mouth every morning.  . valsartan-hydrochlorothiazide (DIOVAN-HCT) 320-25 MG per tablet Take 1 tablet by mouth every morning.  . [DISCONTINUED] mupirocin ointment (BACTROBAN) 2 % Place 1 application into the nose 2 (two) times daily.  Marland Kitchen acetaminophen (TYLENOL) 500 MG tablet Take 500 mg by mouth every 6 (six) hours as needed. For pain  . [DISCONTINUED] doxycycline (ADOXA) 100 MG tablet Take 100 mg by mouth 2 (two) times daily. FOR 30 DAYS  Started 03/16/13  . [DISCONTINUED] FLUoxetine (PROZAC) 20 MG capsule Take 20 mg by mouth every morning.  . [DISCONTINUED] minocycline (MINOCIN,DYNACIN) 100 MG capsule Take 100 mg by mouth 2 (two) times daily as needed (rosacea).  . [DISCONTINUED] Multiple Vitamin (MULTIVITAMIN WITH MINERALS) TABS Take 1 tablet by mouth daily.  . [DISCONTINUED] predniSONE (STERAPRED UNI-PAK 21 TAB) 10 MG (21) TBPK tablet Take 10 mg by mouth daily.  . [DISCONTINUED] traMADol (ULTRAM) 50 MG tablet Take 1 tablet (50 mg total) by mouth every 6 (six) hours as needed for moderate pain.  . [DISCONTINUED] vitamin B-12 (CYANOCOBALAMIN) 1000 MCG tablet Take 1,000 mcg by mouth daily.  . [DISCONTINUED] VITAMIN E PO Take 1 tablet by mouth every morning.   No facility-administered encounter medications on file as of 02/14/2016.     Allergies as of 02/14/2016 - Review Complete 05/24/2014  Allergen Reaction Noted  . Lexapro [escitalopram]  03/22/2013  . Peroxide [hydrogen peroxide] Other (See Comments)  05/02/2014  . Zoloft [sertraline hcl]  03/22/2013  . Zoloft [sertraline hcl] Other (See Comments) 05/02/2014  . Neosporin [neomycin-bacitracin zn-polymyx] Rash 05/02/2014    Past Medical History:  Diagnosis Date  . Anxiety   . Arthritis   . Cataracts, bilateral    immature  . Depression   . GERD (gastroesophageal reflux disease)    was on Nexium   . GERD (gastroesophageal reflux disease)   . Hemorrhoid   . History of bronchitis    couple of yrs ago  . History  of colon polyps   . Hyperlipidemia    takes Pravastatin daily  . Hypertension    takes Amlodipine and Diovan daily  . Hypertension   . Sleep apnea    no cpap used  . Sleep apnea    study done 20+yrs ago and doesn't use cpap  . Urinary urgency   . Weakness    in left leg r/t back    Past Surgical History:  Procedure Laterality Date  . ABDOMINAL HYSTERECTOMY  1980   partial  . ABDOMINAL HYSTERECTOMY     partial  . BACK SURGERY    . CARPAL TUNNEL RELEASE    . CHOLECYSTECTOMY  1975  . CHOLECYSTECTOMY    . COLONOSCOPY    . COLONOSCOPY WITH PROPOFOL  12/29/2011   Procedure: COLONOSCOPY WITH PROPOFOL;  Surgeon: Lear Ng, MD;  Location: [facility];  Service: Endoscopy;  Laterality: N/A;  . COLONOSCOPY WITH PROPOFOL N/A 05/24/2014   Procedure: COLONOSCOPY WITH PROPOFOL;  Surgeon: Wilford Corner, MD;  Location: [facility];  Service: Endoscopy;  Laterality: N/A;  . ESOPHAGOGASTRODUODENOSCOPY    . FOOT SURGERY     left, right  . FRACTURE SURGERY     left leg-knee  . heel spurs removed Bilateral   . HOT HEMOSTASIS  12/29/2011   Procedure: HOT HEMOSTASIS (ARGON PLASMA COAGULATION/BICAP);  Surgeon: Lear Ng, MD;  Location: [facility];  Service: Endoscopy;  Laterality: N/A;  . HOT HEMOSTASIS N/A 05/24/2014   Procedure: HOT HEMOSTASIS (ARGON PLASMA COAGULATION/BICAP);  Surgeon: Wilford Corner, MD;  Location: [facility];  Service: Endoscopy;  Laterality: N/A;  . leg surgery d/t break Left   . LUMBAR LAMINECTOMY/DECOMPRESSION MICRODISCECTOMY Left 04/05/2013   Procedure: LUMBAR FOUR TO FIVE LUMBAR LAMINECTOMY/DECOMPRESSION MICRODISCECTOMY 1 LEVEL;  Surgeon: Eustace Moore, MD;  Location: [facility];  Service: Neurosurgery;  Laterality: Left;  . NISSEN FUNDOPLICATION      No family history on file.  Social History   Social History  . Marital status: Married    Spouse name: N/A  . Number of children: N/A  . Years of education: N/A   Occupational  History  . Not on file.   Social History Main Topics  . Smoking status: Never Smoker  . Smokeless tobacco: Not on file  . Alcohol use No  . Drug use: No  . Sexual activity: Yes    Birth control/ protection: Surgical   Other Topics Concern  . Not on file   Social History Narrative   ** Merged History Encounter **       Review of systems: Review of Systems  Constitutional: Negative for fever and chills.  HENT: Negative.   Eyes: Negative for blurred vision.  Respiratory: as per HPI  Cardiovascular: Negative for chest pain and palpitations.  Gastrointestinal: Negative for vomiting, diarrhea, blood per rectum. Genitourinary: Negative for dysuria, urgency, frequency and hematuria.  Musculoskeletal: Negative for myalgias, back pain and joint pain.  Skin: Negative for  itching and rash.  Neurological: Negative for dizziness, tremors, focal weakness, seizures and loss of consciousness.  Endo/Heme/Allergies: Negative for environmental allergies.  Psychiatric/Behavioral: Negative for depression, suicidal ideas and hallucinations.  All other systems reviewed and are negative.  Physical Exam: Blood pressure 116/70, pulse (!) 152, SpO2 98 %. Gen:      No acute distress HEENT:  EOMI, sclera anicteric Neck:     No masses; no thyromegaly Lungs:    Clear to auscultation bilaterally; normal respiratory effort CV:         Regular rate and rhythm; no murmurs Abd:      + bowel sounds; soft, non-tender; no palpable masses, no distension Ext:    No edema; adequate peripheral perfusion Skin:      Warm and dry; no rash Neuro: alert and oriented x 3 Psych: normal mood and affect  Data Reviewed: CXR 01/17/16-mild enlargement of the heart. No active pulmonary process. Images reviewed.  FENO 02/14/16- 17  Assessment:  Chronic cough. The cough is likely postinfectious after what looks like a viral bronchitis episode. Her symptoms are exacerbated by acid reflux and probable postnasal drip. I  explained to her that cough from damaged epithelium is very hard to resolve with ongoing insult from GERD and postnasal drip. We will try to break the cough cycle with Flonase. I'll increase the chlorphentermine to 8 mg 3 times daily temporarily. She'll continue on the Pepcid and I'll add Nexium once a day.  I educated her on behavioral changes to deal with cough including conscious suppression of the urge to cough, use of throat lozenges.  She has low FENO in the office today arguing against asthma. She'll continue on the Symbicort for now and I'll get pulmonary function tests.  Plan/Recommendations: - Continue symbicort, schedule PFTs - Increase chlorpheniramine to 8 mg tid, start flonase - Continue pepcid, start nexium 40 mg/day.  Marshell Garfinkel MD [facility] Pager [phone] 02/14/2016, 11:43 AM  CC: Harlan Stains, MD

## 2016-02-14 NOTE — Patient Instructions (Addendum)
Increase chlorphentermine to 8 mg 3 times daily for 2 weeks We'll start you on a Flonase nasal spray. Continue Pepcid. Add nexium 40 mg qd Continue Symbicort for now. Will schedule a pulmonary function tests  Return to clinic in 1-2 months

## 2016-02-17 ENCOUNTER — Inpatient Hospital Stay (HOSPITAL_COMMUNITY)
Admission: AD | Admit: 2016-02-17 | Discharge: 2016-02-20 | DRG: 310 | Disposition: A | Discharge status: Home / Self Care | Payer: Medicare Other | Source: Ambulatory Visit | Attending: Internal Medicine | Admitting: Internal Medicine

## 2016-02-17 ENCOUNTER — Ambulatory Visit (INDEPENDENT_AMBULATORY_CARE_PROVIDER_SITE_OTHER): Payer: Medicare Other | Admitting: Internal Medicine

## 2016-02-17 ENCOUNTER — Inpatient Hospital Stay (HOSPITAL_COMMUNITY): Payer: Medicare Other

## 2016-02-17 ENCOUNTER — Encounter: Payer: Self-pay | Admitting: Internal Medicine

## 2016-02-17 VITALS — BP 114/82 | HR 149 | Ht 67.0 in | Wt 224.0 lb

## 2016-02-17 DIAGNOSIS — R7303 Prediabetes: Secondary | ICD-10-CM | POA: Diagnosis not present

## 2016-02-17 DIAGNOSIS — Z79899 Other long term (current) drug therapy: Secondary | ICD-10-CM

## 2016-02-17 DIAGNOSIS — G4730 Sleep apnea, unspecified: Secondary | ICD-10-CM | POA: Diagnosis present

## 2016-02-17 DIAGNOSIS — J449 Chronic obstructive pulmonary disease, unspecified: Secondary | ICD-10-CM | POA: Diagnosis not present

## 2016-02-17 DIAGNOSIS — Z9049 Acquired absence of other specified parts of digestive tract: Secondary | ICD-10-CM | POA: Diagnosis not present

## 2016-02-17 DIAGNOSIS — R05 Cough: Secondary | ICD-10-CM | POA: Diagnosis not present

## 2016-02-17 DIAGNOSIS — I10 Essential (primary) hypertension: Secondary | ICD-10-CM | POA: Diagnosis not present

## 2016-02-17 DIAGNOSIS — I129 Hypertensive chronic kidney disease with stage 1 through stage 4 chronic kidney disease, or unspecified chronic kidney disease: Secondary | ICD-10-CM | POA: Diagnosis not present

## 2016-02-17 DIAGNOSIS — I481 Persistent atrial fibrillation: Secondary | ICD-10-CM | POA: Diagnosis not present

## 2016-02-17 DIAGNOSIS — R000 Tachycardia, unspecified: Secondary | ICD-10-CM | POA: Diagnosis present

## 2016-02-17 DIAGNOSIS — I480 Paroxysmal atrial fibrillation: Secondary | ICD-10-CM

## 2016-02-17 DIAGNOSIS — K219 Gastro-esophageal reflux disease without esophagitis: Secondary | ICD-10-CM

## 2016-02-17 DIAGNOSIS — Z7951 Long term (current) use of inhaled steroids: Secondary | ICD-10-CM | POA: Diagnosis not present

## 2016-02-17 DIAGNOSIS — F329 Major depressive disorder, single episode, unspecified: Secondary | ICD-10-CM | POA: Diagnosis present

## 2016-02-17 DIAGNOSIS — I4891 Unspecified atrial fibrillation: Secondary | ICD-10-CM

## 2016-02-17 DIAGNOSIS — Z8249 Family history of ischemic heart disease and other diseases of the circulatory system: Secondary | ICD-10-CM

## 2016-02-17 DIAGNOSIS — I081 Rheumatic disorders of both mitral and tricuspid valves: Secondary | ICD-10-CM | POA: Diagnosis present

## 2016-02-17 DIAGNOSIS — E785 Hyperlipidemia, unspecified: Secondary | ICD-10-CM

## 2016-02-17 DIAGNOSIS — Z86010 Personal history of colonic polyps: Secondary | ICD-10-CM

## 2016-02-17 DIAGNOSIS — Z91048 Other nonmedicinal substance allergy status: Secondary | ICD-10-CM

## 2016-02-17 DIAGNOSIS — E7800 Pure hypercholesterolemia, unspecified: Secondary | ICD-10-CM | POA: Diagnosis not present

## 2016-02-17 DIAGNOSIS — I272 Pulmonary hypertension, unspecified: Secondary | ICD-10-CM | POA: Diagnosis present

## 2016-02-17 DIAGNOSIS — Z90710 Acquired absence of both cervix and uterus: Secondary | ICD-10-CM | POA: Diagnosis not present

## 2016-02-17 DIAGNOSIS — Z888 Allergy status to other drugs, medicaments and biological substances status: Secondary | ICD-10-CM

## 2016-02-17 DIAGNOSIS — N183 Chronic kidney disease, stage 3 (moderate): Secondary | ICD-10-CM | POA: Diagnosis not present

## 2016-02-17 DIAGNOSIS — Z7982 Long term (current) use of aspirin: Secondary | ICD-10-CM | POA: Diagnosis not present

## 2016-02-17 DIAGNOSIS — I429 Cardiomyopathy, unspecified: Secondary | ICD-10-CM | POA: Diagnosis present

## 2016-02-17 DIAGNOSIS — F419 Anxiety disorder, unspecified: Secondary | ICD-10-CM | POA: Diagnosis present

## 2016-02-17 DIAGNOSIS — J984 Other disorders of lung: Secondary | ICD-10-CM

## 2016-02-17 DIAGNOSIS — R053 Chronic cough: Secondary | ICD-10-CM

## 2016-02-17 DIAGNOSIS — R5383 Other fatigue: Secondary | ICD-10-CM

## 2016-02-17 HISTORY — DX: Paroxysmal atrial fibrillation: I48.0

## 2016-02-17 LAB — CBC WITH DIFFERENTIAL/PLATELET
Basophils Absolute: 0.1 10*3/uL (ref 0.0–0.1)
Basophils Relative: 1 %
EOS PCT: 3 %
Eosinophils Absolute: 0.2 10*3/uL (ref 0.0–0.7)
HCT: 42.4 % (ref 36.0–46.0)
Hemoglobin: 14.1 g/dL (ref 12.0–15.0)
LYMPHS ABS: 3.9 10*3/uL (ref 0.7–4.0)
LYMPHS PCT: 49 %
MCH: 30.6 pg (ref 26.0–34.0)
MCHC: 33.3 g/dL (ref 30.0–36.0)
MCV: 92 fL (ref 78.0–100.0)
MONO ABS: 0.7 10*3/uL (ref 0.1–1.0)
Monocytes Relative: 9 %
Neutro Abs: 3 10*3/uL (ref 1.7–7.7)
Neutrophils Relative %: 38 %
PLATELETS: 215 10*3/uL (ref 150–400)
RBC: 4.61 MIL/uL (ref 3.87–5.11)
RDW: 13.6 % (ref 11.5–15.5)
WBC: 7.9 10*3/uL (ref 4.0–10.5)

## 2016-02-17 LAB — COMPREHENSIVE METABOLIC PANEL
ALT: 10 U/L — AB (ref 14–54)
AST: 16 U/L (ref 15–41)
Albumin: 3.9 g/dL (ref 3.5–5.0)
Alkaline Phosphatase: 52 U/L (ref 38–126)
Anion gap: 11 (ref 5–15)
BILIRUBIN TOTAL: 0.5 mg/dL (ref 0.3–1.2)
BUN: 14 mg/dL (ref 6–20)
CALCIUM: 9.3 mg/dL (ref 8.9–10.3)
CO2: 27 mmol/L (ref 22–32)
CREATININE: 1.41 mg/dL — AB (ref 0.44–1.00)
Chloride: 103 mmol/L (ref 101–111)
GFR calc Af Amer: 43 mL/min — ABNORMAL LOW (ref >60)
GFR, EST NON AFRICAN AMERICAN: 37 mL/min — AB (ref >60)
Glucose, Bld: 92 mg/dL (ref 65–99)
Potassium: 3.3 mmol/L — ABNORMAL LOW (ref 3.5–5.1)
Sodium: 141 mmol/L (ref 135–145)
TOTAL PROTEIN: 6.7 g/dL (ref 6.5–8.1)

## 2016-02-17 LAB — MAGNESIUM: MAGNESIUM: 1.7 mg/dL (ref 1.7–2.4)

## 2016-02-17 LAB — BRAIN NATRIURETIC PEPTIDE: B Natriuretic Peptide: 150.2 pg/mL — ABNORMAL HIGH (ref 0.0–100.0)

## 2016-02-17 LAB — PROTIME-INR
INR: 0.99
Prothrombin Time: 13.1 seconds (ref 11.4–15.2)

## 2016-02-17 LAB — T4, FREE: Free T4: 0.93 ng/dL (ref 0.61–1.12)

## 2016-02-17 LAB — APTT: aPTT: 28 seconds (ref 24–36)

## 2016-02-17 LAB — TSH: TSH: 3.031 u[IU]/mL (ref 0.350–4.500)

## 2016-02-17 MED ORDER — FLUTICASONE PROPIONATE 50 MCG/ACT NA SUSP: 2.0000 | Freq: Every day | NASAL | Status: DC

## 2016-02-17 MED ORDER — METOPROLOL TARTRATE 12.5 MG HALF TABLET
12.5000 mg | ORAL_TABLET | Freq: Four times a day (QID) | ORAL | Status: DC
  Administered 2016-02-17 – 2016-02-20 (×11): 12.5 mg via ORAL
  Filled 2016-02-17 (×13): qty 1

## 2016-02-17 MED ORDER — APIXABAN 5 MG PO TABS
5.0000 mg | ORAL_TABLET | Freq: Two times a day (BID) | ORAL | Status: DC
  Administered 2016-02-17 – 2016-02-20 (×6): 5 mg via ORAL
  Filled 2016-02-17 (×7): qty 1

## 2016-02-17 MED ORDER — MOMETASONE FURO-FORMOTEROL FUM 200-5 MCG/ACT IN AERO
2.0000 | INHALATION_SPRAY | Freq: Two times a day (BID) | RESPIRATORY_TRACT | Status: DC
  Administered 2016-02-17 – 2016-02-20 (×6): 2 via RESPIRATORY_TRACT
  Filled 2016-02-17: qty 8.8

## 2016-02-17 MED ORDER — FAMOTIDINE 20 MG PO TABS
40.0000 mg | ORAL_TABLET | Freq: Every day | ORAL | Status: DC
  Administered 2016-02-17 – 2016-02-20 (×4): 40 mg via ORAL
  Filled 2016-02-17 (×4): qty 2

## 2016-02-17 MED ORDER — ACETAMINOPHEN 325 MG PO TABS: 650.0000 mg | ORAL_TABLET | ORAL | Status: DC | PRN

## 2016-02-17 MED ORDER — BUPROPION HCL ER (XL) 150 MG PO TB24
150.0000 mg | ORAL_TABLET | Freq: Every morning | ORAL | Status: DC
  Filled 2016-02-17: qty 1

## 2016-02-17 MED ORDER — ONDANSETRON HCL 4 MG/2ML IJ SOLN: 4.0000 mg | Freq: Four times a day (QID) | INTRAMUSCULAR | Status: DC | PRN

## 2016-02-17 MED ORDER — MAGNESIUM SULFATE 2 GM/50ML IV SOLN
2.0000 g | Freq: Once | INTRAVENOUS | Status: AC
  Administered 2016-02-17: 2 g via INTRAVENOUS
  Filled 2016-02-17: qty 50

## 2016-02-17 MED ORDER — BUPROPION HCL ER (XL) 150 MG PO TB24
150.0000 mg | ORAL_TABLET | Freq: Every day | ORAL | Status: DC
  Administered 2016-02-17 – 2016-02-20 (×4): 150 mg via ORAL
  Filled 2016-02-17 (×5): qty 1

## 2016-02-17 MED ORDER — PRAVASTATIN SODIUM 40 MG PO TABS
40.0000 mg | ORAL_TABLET | Freq: Every day | ORAL | Status: DC
  Administered 2016-02-17 – 2016-02-19 (×3): 40 mg via ORAL
  Filled 2016-02-17 (×3): qty 1

## 2016-02-17 MED ORDER — POTASSIUM CHLORIDE CRYS ER 20 MEQ PO TBCR
40.0000 meq | EXTENDED_RELEASE_TABLET | Freq: Once | ORAL | Status: AC
  Administered 2016-02-17: 40 meq via ORAL
  Filled 2016-02-17: qty 2

## 2016-02-17 NOTE — H&P (Addendum)
Cardiology History & Physical    Patient ID: [name] [name] MRN: [id number], DOB: 1945/08/13 Date of Encounter: 02/17/2016, 4:53 PM Primary Physician: Vidal Schwalbe, MD Primary Cardiologist: [facility] [place] [facility]  Chief Complaint: Rapid heart rate Reason for Admission: Atrial fibrillation with rapid ventricular response  HPI: [name] [name] is a 71 y.o. year-old female with history of chronic kidney disease stage III, hypertension, hyperlipidemia, sleep apnea, and borderline diabetes who has been referred by Dr. Jimmy Footman for evaluation of tachycardia.  She presented to Dr. Deterding's office for routine follow-up of CKD this morning and was noted to be tachycardia with an irregularly irregular rhythm. Patient reports that she has had a chronic cough for the last 2 months. She was evaluated by her PCP in early January, at which time the patient reports her heart rate was noted to be normal. However, just a week or so later, [name] [name] states her heart rate was around 140 when being evaluated by orthopedics for knee pain. Since that time, she has monitored her heart rate at home, noting that it is occasionally in the 70s to 80s but also much higher at times. She does not feel any palpitations and also denies a history of arrhythmias. [name] [name] notes occasional lightheadedness, especially when first standing up. She has intermittent vague chest tightness lasting only a brief moment. This happens about twice a month, having last been present week ago. She also endorses significant fatigue and feeling "sleepy" for the last 2 months. She denies focal neurologic changes, including weakness, speech difficulties, and paresthesias, as well as orthopnea, PND, and leg edema.  [name] [name] reports having undergone a stress test in [place] about 15-20 years ago. To her knowledge, was normal. She has not undergone any additional cardiac testing. She has had multiple surgeries, including Nissen fundoplication due to severe acid  reflux. She was seen by pulmonologist last week, at which time she was advised to continue Symbicort, increase chlorpheniramine, and add Flonase. She was also started on Nexium in addition to standing famotidine. Of note, her heart rate was 152 at that visit as well.  Past Medical History:  Diagnosis Date  . Anxiety   . Arthritis   . Cataracts, bilateral    immature  . CKD (chronic kidney disease) stage 3, GFR 30-59 ml/min   . Depression   . GERD (gastroesophageal reflux disease)    was on Nexium   . GERD (gastroesophageal reflux disease)   . Hemorrhoid   . History of bronchitis    couple of yrs ago  . History of colon polyps   . Hyperlipidemia    takes Pravastatin daily  . Hypertension    takes Amlodipine and Diovan daily  . Hypertension   . Sleep apnea    no cpap used  . Sleep apnea    study done 20+yrs ago and doesn't use cpap  . Urinary urgency   . Weakness    in left leg r/t back     Surgical History:  Past Surgical History:  Procedure Laterality Date  . ABDOMINAL HYSTERECTOMY  1980   partial  . ABDOMINAL HYSTERECTOMY     partial  . BACK SURGERY    . CARPAL TUNNEL RELEASE    . CHOLECYSTECTOMY  1975  . CHOLECYSTECTOMY    . COLONOSCOPY    . COLONOSCOPY WITH PROPOFOL  12/29/2011   Procedure: COLONOSCOPY WITH PROPOFOL;  Surgeon: Lear Ng, MD;  Location: [facility];  Service: Endoscopy;  Laterality: N/A;  .  COLONOSCOPY WITH PROPOFOL N/A 05/24/2014   Procedure: COLONOSCOPY WITH PROPOFOL;  Surgeon: Wilford Corner, MD;  Location: [facility];  Service: Endoscopy;  Laterality: N/A;  . ESOPHAGOGASTRODUODENOSCOPY    . FOOT SURGERY     left, right  . FRACTURE SURGERY     left leg-knee  . heel spurs removed Bilateral   . HOT HEMOSTASIS  12/29/2011   Procedure: HOT HEMOSTASIS (ARGON PLASMA COAGULATION/BICAP);  Surgeon: Lear Ng, MD;  Location: [facility];  Service: Endoscopy;  Laterality: N/A;  . HOT HEMOSTASIS N/A 05/24/2014   Procedure: HOT  HEMOSTASIS (ARGON PLASMA COAGULATION/BICAP);  Surgeon: Wilford Corner, MD;  Location: [facility];  Service: Endoscopy;  Laterality: N/A;  . leg surgery d/t break Left   . LUMBAR LAMINECTOMY/DECOMPRESSION MICRODISCECTOMY Left 04/05/2013   Procedure: LUMBAR FOUR TO FIVE LUMBAR LAMINECTOMY/DECOMPRESSION MICRODISCECTOMY 1 LEVEL;  Surgeon: Eustace Moore, MD;  Location: [facility];  Service: Neurosurgery;  Laterality: Left;  . NISSEN FUNDOPLICATION       Home Meds: Prior to Admission medications   Medication Sig Start Date Kersten Salmons Date Taking? Authorizing Provider  acetaminophen (TYLENOL) 500 MG tablet Take 500 mg by mouth every 6 (six) hours as needed. For pain   Yes Historical Provider, MD  amLODipine (NORVASC) 10 MG tablet Take 5 mg by mouth every morning.    Yes Historical Provider, MD  Ascorbic Acid (VITAMIN C WITH ROSE HIPS) 1000 MG tablet Take 1,000 mg by mouth daily.   Yes Historical Provider, MD  aspirin EC 81 MG tablet Take 81 mg by mouth every morning.   Yes Historical Provider, MD  budesonide-formoterol (SYMBICORT) 160-4.5 MCG/ACT inhaler Inhale 2 puffs into the lungs 2 (two) times daily. 02/14/16  Yes Praveen Mannam, MD  buPROPion (WELLBUTRIN XL) 150 MG 24 hr tablet Take 150 mg by mouth every morning.   Yes Historical Provider, MD  CALCIUM PO Take 1 tablet by mouth every morning.   Yes Historical Provider, MD  chlorpheniramine (CHLOR-TRIMETON) 4 MG tablet Take 8 mg by mouth 3 (three) times daily.   Yes Historical Provider, MD  Cholecalciferol (VITAMIN D) 2000 UNITS tablet Take 4,000 Units by mouth daily.   Yes Historical Provider, MD  famotidine (PEPCID) 40 MG tablet Take 1 tablet (40 mg total) by mouth daily. 02/14/16  Yes Praveen Mannam, MD  pravastatin (PRAVACHOL) 40 MG tablet Take 40 mg by mouth at bedtime.   Yes Historical Provider, MD  valsartan-hydrochlorothiazide (DIOVAN-HCT) 320-25 MG per tablet Take 1 tablet by mouth every morning.   Yes Historical Provider, MD  fluticasone  (FLONASE) 50 MCG/ACT nasal spray Place 2 sprays into both nostrils daily. Patient not taking: Reported on 02/17/2016 02/14/16   Marshell Garfinkel, MD    Allergies:  Allergies  Allergen Reactions  . Lexapro [Escitalopram]     Makes her feel funny  . Peroxide [Hydrogen Peroxide] Other (See Comments)    Redness.   [place] [Sertraline Hcl]     Makes her feel funny  . Neosporin [Neomycin-Bacitracin Zn-Polymyx] Rash    Blisters, itching.     Social History   Social History  . Marital status: Married    Spouse name: N/A  . Number of children: N/A  . Years of education: N/A   Occupational History  . Not on file.   Social History Main Topics  . Smoking status: Never Smoker  . Smokeless tobacco: Never Used  . Alcohol use No  . Drug use: No  . Sexual activity: Yes    Birth  control/ protection: Surgical   Other Topics Concern  . Not on file   Social History Narrative   ** Merged History Encounter **         Family History  Problem Relation Age of Onset  . Colon cancer Mother   . Heart disease Father     Review of Systems: Patient endorses excessive sweating and muscle/joint pain. Otherwise, a 12-system review of systems was performed and was negative except as noted in the HPI.  Labs:   Lab Results  Component Value Date   WBC 7.9 03/28/2013   HGB 14.8 03/28/2013   HCT 42.3 03/28/2013   MCV 90.0 03/28/2013   PLT 216 03/28/2013   No results for input(s): NA, K, CL, CO2, BUN, CREATININE, CALCIUM, PROT, BILITOT, ALKPHOS, ALT, AST, GLUCOSE in the last 168 hours.  Invalid input(s): LABALBU No results for input(s): CKTOTAL, CKMB, TROPONINI in the last 72 hours. No results found for: CHOL, HDL, LDLCALC, TRIG No results found for: DDIMER  Radiology/Studies:  No results found. Wt Readings from Last 3 Encounters:  02/17/16 224 lb (101.6 kg)  05/24/14 224 lb (101.6 kg)  04/05/13 240 lb (108.9 kg)    EKG: Atrial fibrillation with rapid ventricular response (ventricular  rate 149 bpm). Low voltage QRS with nonspecific ST/T changes. Compared with prior tracing from 03/28/13, atrial fibrillation is new (I have personally reviewed both tracings).  Physical Exam: VS: BP 114/82, HR 149, O2 sat 97%, HT 170 cm, WT 101.6 kg General:  Obese woman, seated comfortably in the exam room. HEENT: No conjunctival pallor or scleral icterus.  Moist mucous membranes.  OP clear. Neck: Supple without lymphadenopathy, thyromegaly, JVD, or HJR.  No carotid bruit. Lungs: Normal work of breathing.  Clear to auscultation bilaterally without wheezes or crackles. Heart: Tachycardic and irregularly irregular. No murmurs or rubs. Unable to assess PMI due to body habitus. Abd: Bowel sounds present.  Soft, NT/ND without hepatosplenomegaly Ext: No lower extremity edema.  1+ radial and 2+ pedal pulses bilaterally. Skin: warm and dry without rash Neuro: CNIII-XII intact.  Strength and fine-touch sensation intact in upper and lower extremities bilaterally. Psych: Normal mood and affect.    Assessment and Plan  71 year old woman with history of hyperlipidemia, hypertension, COPD stage III, borderline diabetes, and obstructive sleep, presenting with atrial fibrillation with rapid ventricular response.  Atrial fibrillation with rapid ventricular response: Exact duration is uncertain, though most likely has been present for at least 3-4 weeks a son report of heart rate near 140 noted during orthopedics visit in early to mid-January. Patient's only symptoms are of orthostatic lightheadedness and fatigue. I am concerned about potential for cardiomyopathy, given duration of uncontrolled heart rate and chest x-ray on 01/17/16 demonstrating mild cardiomegaly. Patient will therefore be admitted for rate control and further workup of her atrial fibrillation.  Admitted to telemetry.  Check CBC, CMP, Mg, BNP, TSH, free T4.  Obtain chest radiograph and transthoracic echocardiogram.  Initiate metoprolol  tartrate 12.5 mg by mouth every 6 hours, to be up titrated as tolerated to achieve resting heart rate less than 110 bpm.  Initiate apixaban 5 mg twice a day for anticoagulation.  NPO after midnight should invasive procedures be needed tomorrow, though I would prefer to avoid TEE unless absolutely necessary due to history of significant GERD and Nissen fundoplication.  Addendum: (02/18/16 @ 6:16 PM): CHADSVASC is at least 3; therapeutic anticoagulation is therefore indicated for her probable persistent atrial fibrillation.  Essential hypertension: Blood pressure normal today.  Hold amlodipine and valsartan-HCTZ in the setting of a-fib with RVR and potential need for uptitration of rate control agents.  Hyperlipidemia: Patient on pravastatin at home.  Check fasting lipid panel and hemoglobin A1c for risk stratification.  GERD:  Continue famotidine.  Chronic cough: Likely multifactorial but could also be due to atrial fibrillation over the last few months, in addition to GERD and underlying lung disease.  Continue home bronchodilator and allergy regimen (with the exception of chlorpheniramine, which has potential to precipitate arrhythmias)  Continue with GERD therapy.  Code status:  Full code.  Verlan Friends Latandra Loureiro MD 02/17/2016, 4:53 PM Pager: [phone]

## 2016-02-17 NOTE — Progress Notes (Signed)
Received patient direct admit from MDs office with Afib with RVR .HR on the 140's-150's. Patient is asymptomatic, no complaints of any discomfort,no chest pain. Scheduled Metoprolol given but HR remains in the 130's-140's. MD paged, stated its ok as long as patient is asymptomatic. Will monitor patient accordingly.

## 2016-02-17 NOTE — Progress Notes (Signed)
h  New Outpatient Visit Date: 02/17/2016  Referring Provider: Mauricia Area, MD [facility]  Chief Complaint: Tachycardia  HPI:  [name] [name] is a 71 y.o. year-old female with history of chronic kidney disease stage III, hypertension, hyperlipidemia, sleep apnea, and borderline diabetes who has been referred by Dr. Jimmy Footman for evaluation of tachycardia.  She presented to Dr. Deterding's office for routine follow-up of CKD this morning and was noted to be tachycardia with an irregularly irregular rhythm. Patient reports that she has had a chronic cough for the last 2 months. She was evaluated by her PCP in early January, at which time the patient reports her heart rate was noted to be normal. However, just a week or so later, [name] [name] states her heart rate was around 140 when being evaluated by orthopedics for knee pain. Since that time, she has monitored her heart rate at home, noting that it is occasionally in the 70s to 80s but also much higher at times. She does not feel any palpitations and also denies a history of arrhythmias. [name] notes occasional lightheadedness, especially when first standing up. She has intermittent vague chest tightness lasting only a brief moment. This happens about twice a month, having last been present week ago. She also endorses significant fatigue and feeling "sleepy" for the last 2 months. She denies focal neurologic changes, including weakness, speech difficulties, and paresthesias, as well as orthopnea, PND, and leg edema.  [name] reports having undergone a stress test in [place] about 15-20 years ago. To her knowledge, was normal. She has not undergone any additional cardiac testing. She has had multiple surgeries, including Nissen fundoplication due to severe acid reflux. She was seen by pulmonologist last week, at which time she was advised to continue Symbicort, increase chlorpheniramine, and add Flonase. She was also started on  Nexium in addition to standing famotidine. Of note, her heart rate was 152 at that visit as well.  --------------------------------------------------------------------------------------------------  Cardiovascular History & Procedures: Cardiovascular Problems:  Atrial fibrillation  Risk Factors:  Hypertension, hyperlipidemia, and age greater than 47  Cath/PCI:  None  CV Surgery:  None  EP Procedures and Devices:  None  Non-Invasive Evaluation(s):  Stress test (15-20 years ago): Normal per patient's report.  Recent CV Pertinent Labs: Lab Results  Component Value Date   INR 0.93 03/28/2013   K 3.6 (L) 03/28/2013   BUN 21 03/28/2013   CREATININE 0.97 03/28/2013    --------------------------------------------------------------------------------------------------  Past Medical History:  Diagnosis Date  . Anxiety   . Arthritis   . Cataracts, bilateral    immature  . CKD (chronic kidney disease) stage 3, GFR 30-59 ml/min   . Depression   . GERD (gastroesophageal reflux disease)    was on Nexium   . GERD (gastroesophageal reflux disease)   . Hemorrhoid   . History of bronchitis    couple of yrs ago  . History of colon polyps   . Hyperlipidemia    takes Pravastatin daily  . Hypertension    takes Amlodipine and Diovan daily  . Hypertension   . Sleep apnea    no cpap used  . Sleep apnea    study done 20+yrs ago and doesn't use cpap  . Urinary urgency   . Weakness    in left leg r/t back    Past Surgical History:  Procedure Laterality Date  . ABDOMINAL HYSTERECTOMY  1980   partial  . ABDOMINAL HYSTERECTOMY     partial  . BACK  SURGERY    . CARPAL TUNNEL RELEASE    . CHOLECYSTECTOMY  1975  . CHOLECYSTECTOMY    . COLONOSCOPY    . COLONOSCOPY WITH PROPOFOL  12/29/2011   Procedure: COLONOSCOPY WITH PROPOFOL;  Surgeon: Lear Ng, MD;  Location: [facility];  Service: Endoscopy;  Laterality: N/A;  . COLONOSCOPY WITH PROPOFOL N/A 05/24/2014    Procedure: COLONOSCOPY WITH PROPOFOL;  Surgeon: Wilford Corner, MD;  Location: [facility];  Service: Endoscopy;  Laterality: N/A;  . ESOPHAGOGASTRODUODENOSCOPY    . FOOT SURGERY     left, right  . FRACTURE SURGERY     left leg-knee  . heel spurs removed Bilateral   . HOT HEMOSTASIS  12/29/2011   Procedure: HOT HEMOSTASIS (ARGON PLASMA COAGULATION/BICAP);  Surgeon: Lear Ng, MD;  Location: [facility];  Service: Endoscopy;  Laterality: N/A;  . HOT HEMOSTASIS N/A 05/24/2014   Procedure: HOT HEMOSTASIS (ARGON PLASMA COAGULATION/BICAP);  Surgeon: Wilford Corner, MD;  Location: [facility];  Service: Endoscopy;  Laterality: N/A;  . leg surgery d/t break Left   . LUMBAR LAMINECTOMY/DECOMPRESSION MICRODISCECTOMY Left 04/05/2013   Procedure: LUMBAR FOUR TO FIVE LUMBAR LAMINECTOMY/DECOMPRESSION MICRODISCECTOMY 1 LEVEL;  Surgeon: Eustace Moore, MD;  Location: [facility];  Service: Neurosurgery;  Laterality: Left;  . NISSEN FUNDOPLICATION      No facility-administered encounter medications on file as of 02/17/2016.    Outpatient Encounter Prescriptions as of 02/17/2016  Medication Sig  . acetaminophen (TYLENOL) 500 MG tablet Take 500 mg by mouth every 6 (six) hours as needed. For pain  . amLODipine (NORVASC) 10 MG tablet Take 5 mg by mouth every morning.   Marland Kitchen aspirin EC 81 MG tablet Take 81 mg by mouth every morning.  . budesonide-formoterol (SYMBICORT) 160-4.5 MCG/ACT inhaler Inhale 2 puffs into the lungs 2 (two) times daily.  Marland Kitchen buPROPion (WELLBUTRIN XL) 150 MG 24 hr tablet Take 150 mg by mouth every morning.  Marland Kitchen CALCIUM PO Take 1 tablet by mouth every morning.  . chlorpheniramine (CHLOR-TRIMETON) 4 MG tablet Take 8 mg by mouth 3 (three) times daily.  . Cholecalciferol (VITAMIN D) 2000 UNITS tablet Take 4,000 Units by mouth daily.  . famotidine (PEPCID) 40 MG tablet Take 1 tablet (40 mg total) by mouth daily.  . fluticasone (FLONASE) 50 MCG/ACT nasal spray Place 2 sprays into both  nostrils daily. (Patient not taking: Reported on 02/17/2016)  . pravastatin (PRAVACHOL) 40 MG tablet Take 40 mg by mouth at bedtime.  . valsartan-hydrochlorothiazide (DIOVAN-HCT) 320-25 MG per tablet Take 1 tablet by mouth every morning.  . [DISCONTINUED] FLUZONE HIGH-DOSE 0.5 ML SUSY Inject as directed once.  . [DISCONTINUED] amLODipine (NORVASC) 10 MG tablet Take 10 mg by mouth every morning.  . [DISCONTINUED] aspirin EC 81 MG tablet Take 81 mg by mouth daily.  . [DISCONTINUED] buPROPion (WELLBUTRIN) 100 MG tablet Take 100 mg by mouth every morning.  . [DISCONTINUED] calcium carbonate (OS-CAL) 600 MG TABS tablet Take 1,200 mg by mouth daily with breakfast.  . [DISCONTINUED] Cholecalciferol (VITAMIN D PO) Take 1 tablet by mouth every morning.  . [DISCONTINUED] pravastatin (PRAVACHOL) 40 MG tablet Take 40 mg by mouth at bedtime.  . [DISCONTINUED] valsartan-hydrochlorothiazide (DIOVAN-HCT) 320-25 MG per tablet Take 1 tablet by mouth every morning.    Allergies: Lexapro [escitalopram]; Peroxide [hydrogen peroxide]; Zoloft [sertraline hcl]; and Neosporin [neomycin-bacitracin zn-polymyx]  Social History   Social History  . Marital status: Married    Spouse name: N/A  . Number of children: N/A  .  Years of education: N/A   Occupational History  . Not on file.   Social History Main Topics  . Smoking status: Never Smoker  . Smokeless tobacco: Never Used  . Alcohol use No  . Drug use: No  . Sexual activity: Yes    Birth control/ protection: Surgical   Other Topics Concern  . Not on file   Social History Narrative   ** Merged History Encounter **        Family History  Problem Relation Age of Onset  . Colon cancer Mother   . Heart disease Father     Review of Systems: Patient endorses excessive sweating and muscle/joint pain. Otherwise, a 12-system review of systems was performed and was negative except as noted in the  HPI.  --------------------------------------------------------------------------------------------------  Physical Exam: BP 114/82 (BP Location: Left Arm, Patient Position: Sitting, Cuff Size: Normal)   Pulse (!) 149   Ht 5\' 7"  (1.702 m)   Wt 224 lb (101.6 kg)   SpO2 97%   BMI 35.08 kg/m   General:  Obese woman, seated comfortably in the exam room. HEENT: No conjunctival pallor or scleral icterus.  Moist mucous membranes.  OP clear. Neck: Supple without lymphadenopathy, thyromegaly, JVD, or HJR.  No carotid bruit. Lungs: Normal work of breathing.  Clear to auscultation bilaterally without wheezes or crackles. Heart: Tachycardic and irregularly irregular. No murmurs or rubs. Unable to assess PMI due to body habitus. Abd: Bowel sounds present.  Soft, NT/ND without hepatosplenomegaly Ext: No lower extremity edema.  1+ radial and 2+ pedal pulses bilaterally. Skin: warm and dry without rash Neuro: CNIII-XII intact.  Strength and fine-touch sensation intact in upper and lower extremities bilaterally. Psych: Normal mood and affect.  EKG:  Atrial fibrillation with rapid ventricular response (ventricular rate 149 bpm). Low voltage QRS with nonspecific ST/T changes. Compared with prior tracing from 03/28/13, atrial fibrillation is new (I have personally reviewed both tracings).  Lab Results  Component Value Date   WBC 7.9 03/28/2013   HGB 14.8 03/28/2013   HCT 42.3 03/28/2013   MCV 90.0 03/28/2013   PLT 216 03/28/2013    Lab Results  Component Value Date   NA 143 03/28/2013   K 3.6 (L) 03/28/2013   CL 99 03/28/2013   CO2 30 03/28/2013   BUN 21 03/28/2013   CREATININE 0.97 03/28/2013   GLUCOSE 115 (H) 03/28/2013    --------------------------------------------------------------------------------------------------  ASSESSMENT AND PLAN: Atrial fibrillation with rapid ventricular response Though the patient is largely asymptomatic, her EKG and exam demonstrate marked tachycardia  (a-fib with RVR). Chest x-ray last month was also notable for mild cardiomegaly, potentially heralding undiagnosed cardiomyopathy. Patient appears euvolemic today but notes progressive fatigue and orthostatic lightheadedness. We have discussed treatment options and feel that inpatient management would be safest, given her significant tachycardia. We will plan for direct admission for rate control and anticoagulation. Given history of Nissen fundoplication and significant GERD, I would prefer not to perform TEE guided cardioversion unless absolutely necessary.Once admitted, we will start scheduled metoprolol to be uptitrated to achieve a resting heart rate less than 110 bpm. We will also initiate apixaban 5 mg twice a day for anticoagulation.  Echocardiogram will be performed to assess for structural abnormalities.  Hypertension Blood pressure normal today. In the setting of a-fib with RVR and need for titration of rate control agents, will hold her standing amlodipine and valsartan-HCTZ at this time. These medications can be added back as blood pressure allows.  Hyperlipidemia Patient currently on  pravastatin. Will check lipid panel in the morning.  Chronic lung disease Continue home regimen.  Follow-up: To be determined based on hospital course.  Nelva Bush, MD 02/17/2016 4:51 PM

## 2016-02-17 NOTE — Progress Notes (Signed)
Patient complained of IV site burning during magnesium infusion, stated it wasn't bad, but was just wondering if it was supposed to sting.  I paused the pump and flushed IV, patient said it felt fine. I increased the saline rate and restarted infusion. Will continue to monitor.

## 2016-02-18 ENCOUNTER — Inpatient Hospital Stay (HOSPITAL_COMMUNITY): Payer: Medicare Other

## 2016-02-18 ENCOUNTER — Encounter (HOSPITAL_COMMUNITY): Payer: Self-pay | Admitting: *Deleted

## 2016-02-18 DIAGNOSIS — I4891 Unspecified atrial fibrillation: Secondary | ICD-10-CM

## 2016-02-18 LAB — BASIC METABOLIC PANEL
ANION GAP: 11 (ref 5–15)
BUN: 13 mg/dL (ref 6–20)
CHLORIDE: 104 mmol/L (ref 101–111)
CO2: 26 mmol/L (ref 22–32)
Calcium: 9.2 mg/dL (ref 8.9–10.3)
Creatinine, Ser: 1.25 mg/dL — ABNORMAL HIGH (ref 0.44–1.00)
GFR, EST AFRICAN AMERICAN: 50 mL/min — AB (ref >60)
GFR, EST NON AFRICAN AMERICAN: 43 mL/min — AB (ref >60)
Glucose, Bld: 115 mg/dL — ABNORMAL HIGH (ref 65–99)
POTASSIUM: 3.6 mmol/L (ref 3.5–5.1)
SODIUM: 141 mmol/L (ref 135–145)

## 2016-02-18 LAB — LIPID PANEL
CHOL/HDL RATIO: 3 ratio
CHOLESTEROL: 125 mg/dL (ref 0–200)
HDL: 41 mg/dL (ref >40)
LDL Cholesterol: 66 mg/dL (ref 0–99)
TRIGLYCERIDES: 90 mg/dL (ref <150)
VLDL: 18 mg/dL (ref 0–40)

## 2016-02-18 LAB — HEMOGLOBIN A1C
HEMOGLOBIN A1C: 5.8 % — AB (ref 4.8–5.6)
MEAN PLASMA GLUCOSE: 120 mg/dL

## 2016-02-18 LAB — TSH: TSH: 1.852 u[IU]/mL (ref 0.350–4.500)

## 2016-02-18 LAB — ECHOCARDIOGRAM COMPLETE
Height: 67 in
Weight: 3529.6 oz

## 2016-02-18 MED ORDER — AMIODARONE LOAD VIA INFUSION
150.0000 mg | Freq: Once | INTRAVENOUS | Status: AC
  Administered 2016-02-18: 150 mg via INTRAVENOUS
  Filled 2016-02-18: qty 83.34

## 2016-02-18 MED ORDER — AMIODARONE HCL IN DEXTROSE 360-4.14 MG/200ML-% IV SOLN
60.0000 mg/h | INTRAVENOUS | Status: AC
  Administered 2016-02-18 (×2): 60 mg/h via INTRAVENOUS

## 2016-02-18 MED ORDER — AMIODARONE HCL IN DEXTROSE 360-4.14 MG/200ML-% IV SOLN
30.0000 mg/h | INTRAVENOUS | Status: DC
  Administered 2016-02-19: 30 mg/h via INTRAVENOUS
  Filled 2016-02-18 (×3): qty 200

## 2016-02-18 NOTE — Progress Notes (Signed)
Patients blood pressure was a little lower, heart rate in the 120's. Patient asymptomatic. Called NP on call for the morning, agreed to hold dose of metoprolol this morning.

## 2016-02-18 NOTE — Discharge Instructions (Addendum)
DO NOT eat or drink anything after midnight Sunday, Feb 11th. Come to [facility] admitting Monday Feb 12th at 7:45 am for a 9:00 am procedure. OK to take am meds. TAKE your Eliquis that morning. Any questions, call the office at [phone].    Information on my medicine - ELIQUIS (apixaban)  This medication education was reviewed with me or my healthcare representative as part of my discharge preparation.  The pharmacist that spoke with me during my hospital stay was:  Tad Moore, [facility]  Why was Eliquis prescribed for you? Eliquis was prescribed for you to reduce the risk of a blood clot forming that can cause a stroke if you have a medical condition called atrial fibrillation (a type of irregular heartbeat).  What do You need to know about Eliquis ? Take your Eliquis TWICE DAILY - one tablet in the morning and one tablet in the evening with or without food. If you have difficulty swallowing the tablet whole please discuss with your pharmacist how to take the medication safely.  Take Eliquis exactly as prescribed by your doctor and DO NOT stop taking Eliquis without talking to the doctor who prescribed the medication.  Stopping may increase your risk of developing a stroke.  Refill your prescription before you run out.  After discharge, you should have regular check-up appointments with your healthcare provider that is prescribing your Eliquis.  In the future your dose may need to be changed if your kidney function or weight changes by a significant amount or as you get older.  What do you do if you miss a dose? If you miss a dose, take it as soon as you remember on the same day and resume taking twice daily.  Do not take more than one dose of ELIQUIS at the same time to make up a missed dose.  Important Safety Information A possible side effect of Eliquis is bleeding. You should call your healthcare provider right away if you experience any of the following: ? Bleeding from  an injury or your nose that does not stop. ? Unusual colored urine (red or dark brown) or unusual colored stools (red or black). ? Unusual bruising for unknown reasons. ? A serious fall or if you hit your head (even if there is no bleeding).  Some medicines may interact with Eliquis and might increase your risk of bleeding or clotting while on Eliquis. To help avoid this, consult your healthcare provider or pharmacist prior to using any new prescription or non-prescription medications, including herbals, vitamins, non-steroidal anti-inflammatory drugs (NSAIDs) and supplements.  This website has more information on Eliquis (apixaban): http://www.eliquis.com/eliquis/home

## 2016-02-18 NOTE — Progress Notes (Signed)
Patient still on Amiodarone drip @ 16.62ml/hr, HR is better now  in the low 100's, still on A fib. Patient no complaints of any discomfort. Will endorsed accordingly.

## 2016-02-18 NOTE — Progress Notes (Signed)
Patient Name: [name] [name] Date of Encounter: 02/18/2016  Primary Cardiologist: Dr. Lorretta Harp Problem List     Principal Problem:   Atrial fibrillation with rapid ventricular response [facility]) Active Problems:   Hyperlipidemia   Essential hypertension   Chronic cough   Gastroesophageal reflux disease     Subjective   Patient denies any SOB or CP but is aware of her heart beating fast.  Inpatient Medications    Scheduled Meds: . apixaban  5 mg Oral BID  . buPROPion  150 mg Oral Daily  . famotidine  40 mg Oral Daily  . metoprolol tartrate  12.5 mg Oral Q6H  . mometasone-formoterol  2 puff Inhalation BID  . pravastatin  40 mg Oral QHS   Continuous Infusions:  PRN Meds: acetaminophen, ondansetron (ZOFRAN) IV   Vital Signs    Vitals:   02/18/16 0900 02/18/16 0941 02/18/16 1100 02/18/16 1209  BP: 95/62  100/60 (!) 97/57  Pulse: (!) 121 (!) 114 (!) 121 (!) 127  Resp:  18  18  Temp: 98.2 F (36.8 C)   98.3 F (36.8 C)  TempSrc: Oral   Oral  SpO2: 97% 96%  97%  Weight:      Height:        Intake/Output Summary (Last 24 hours) at 02/18/16 1230 Last data filed at 02/18/16 0830  Gross per 24 hour  Intake              240 ml  Output             1350 ml  Net            -1110 ml   Filed Weights   02/17/16 1654 02/18/16 0607  Weight: 221 lb 8 oz (100.5 kg) 220 lb 9.6 oz (100.1 kg)    Physical Exam    GEN: Well nourished, well developed, in no acute distress.  HEENT: Grossly normal.  Neck: Supple, no JVD, carotid bruits, or masses. Cardiac: irregularly irregular and tachy, no murmurs, rubs, or gallops. No clubbing, cyanosis, edema.  Radials/DP/PT 2+ and equal bilaterally.  Respiratory:  Respirations regular and unlabored, clear to auscultation bilaterally. GI: Soft, nontender, nondistended, BS + x 4. MS: no deformity or atrophy. Skin: warm and dry, no rash. Neuro:  Strength and sensation are intact. Psych: AAOx3.  Normal affect.  Labs    CBC  Recent  Labs  02/17/16 1644  WBC 7.9  NEUTROABS 3.0  HGB 14.1  HCT 42.4  MCV 92.0  PLT 123456   Basic Metabolic Panel  Recent Labs  02/17/16 1644 02/18/16 0454  NA 141 141  K 3.3* 3.6  CL 103 104  CO2 27 26  GLUCOSE 92 115*  BUN 14 13  CREATININE 1.41* 1.25*  CALCIUM 9.3 9.2  MG 1.7  --    Liver Function Tests  Recent Labs  02/17/16 1644  AST 16  ALT 10*  ALKPHOS 52  BILITOT 0.5  PROT 6.7  ALBUMIN 3.9   No results for input(s): LIPASE, AMYLASE in the last 72 hours. Cardiac Enzymes No results for input(s): CKTOTAL, CKMB, CKMBINDEX, TROPONINI in the last 72 hours. BNP Invalid input(s): POCBNP D-Dimer No results for input(s): DDIMER in the last 72 hours. Hemoglobin A1C  Recent Labs  02/17/16 1644  HGBA1C 5.8*   Fasting Lipid Panel  Recent Labs  02/18/16 0454  CHOL 125  HDL 41  LDLCALC 66  TRIG 90  CHOLHDL 3.0   Thyroid Function Tests  Recent Labs  02/17/16 1644  TSH 3.031    Telemetry    Atrial fibrillation with RVR - Personally Reviewed  ECG    Atrial fibrillation with RVR - Personally Reviewed  Radiology    Dg Chest 2 View  Result Date: 02/17/2016 CLINICAL DATA:  Atrial fibrillation EXAM: CHEST  2 VIEW COMPARISON:  01/17/2016 FINDINGS: Stable cardiomegaly with aortic atherosclerosis. No aneurysm. Minimal atelectasis at each lung base. No pneumonic consolidation, CHF, pneumothorax nor effusion. No suspicious osseous abnormalities. IMPRESSION: No active stable cardiomegaly with mild aortic atherosclerosis. Electronically Signed   By: Ashley Royalty M.D.   On: 02/17/2016 23:56    Cardiac Studies   2D echo pending  Patient Profile     [phone] WF with a history of chronic kidney diseasestage III, hypertension, hyperlipidemia, sleep apnea, and borderline diabetes who presented to Dr. Deterding's office for routine follow-up of CKD this morning and was noted to be tachycardia with an irregularly irregular rhythm.Patient reports that she has had a  chronic cough for the last 2 months. She was evaluated by her PCP in early January, at which time the patient reports her heart rate was noted to be normal. However, just a week or so later, Ms. Uyeno states her heart rate was around 140 when being evaluated by orthopedics for knee pain. Since that time, she has monitored her heart rate at home, noting that it is occasionally in the 70s to 80s but also much higher at times. She does not feel any palpitationsand also denies a history of arrhythmias. She has intermittent vague chest tightness lasting only a brief moment. This happens about twice a month, having last been present week ago. She was admitted to [facility] for further control of her afib with RVR.  Assessment & Plan    1.  Atrial fibrillation with rapid ventricular response: exact duration is uncertain, though most likely has been present for at least 3-4 weeks as son reports of heart rate near 140 noted during orthopedics visit in early to mid-January. Patient's only symptoms are of orthostatic lightheadedness and fatigue.  Chest x-ray on 01/17/16 demonstrated mild cardiomegaly and due to concern for possible tachycardia induced CM. Patient she was admitted for rate control and further workup of her atrial fibrillation.  Continue apixaban 5 mg twice a day for anticoagulation.  Rate poorly controlled despite BB therapy.  Cannot increase BB further due to soft BP.  Preliminary echo shows moderate LV dysfunction and severe right sided enlargement with reduced RVF.  Will avoid Cardizem.  Will start Amio 150mg  bolus followed by gtt at 60mg /hr for 6 hours and then 30mg /hr.  There is a risk of conversion on amio but given her cardiomyopathy I think we do not have any other choice for rate control at this time and we need to get HR under control to prevent further decline in LVF.  Would prefer to avoid TEE unless absolutely necessary due to history of significant GERD and Nissen fundoplication although it was  years ago but if we cannot get her HR controlled on IV Amio will have to proceed with TEE.  2.  Essential hypertension:  Blood pressure soft today.  Hold amlodipine and valsartan-HCTZ in the setting of a-fib with RVR and soft BP.   3.  Hyperlipidemia: Patient on pravastatin at home.  Check fasting lipid panel and hemoglobin A1c for risk stratification.  4.  GERD:  Continue famotidine.  5.  Chronic cough: Likely multifactorial but could also be due to atrial  fibrillation over the last few months, in addition to GERD and underlying lung disease.  Continue home bronchodilator and allergy regimen (with the exception of chlorpheniramine, which has potential to precipitate arrhythmias)  Continue with GERD therapy.  Signed, Fransico Him, MD  02/18/2016, 12:30 PM

## 2016-02-18 NOTE — Progress Notes (Signed)
Paged Cardiology PA regarding Eliquis dose if we will hold or give the dose this morning. Ordered to go ahead and give it.

## 2016-02-18 NOTE — Progress Notes (Signed)
Amiodarone drip started,v/s monitored ( see flowsheet ) Baker Janus from [place] notified.

## 2016-02-18 NOTE — Progress Notes (Signed)
  Echocardiogram 2D Echocardiogram has been performed.  [name] [name] 02/18/2016, 11:32 AM

## 2016-02-18 NOTE — Progress Notes (Signed)
Patients blood pressure running lower than normal after receiving metoprolol, patient asymptomatic. Will continue to monitor.

## 2016-02-19 ENCOUNTER — Encounter (HOSPITAL_COMMUNITY): Payer: Self-pay

## 2016-02-19 DIAGNOSIS — E7800 Pure hypercholesterolemia, unspecified: Secondary | ICD-10-CM

## 2016-02-19 MED ORDER — AMIODARONE HCL 200 MG PO TABS
200.0000 mg | ORAL_TABLET | Freq: Two times a day (BID) | ORAL | Status: DC
  Administered 2016-02-19 – 2016-02-20 (×3): 200 mg via ORAL
  Filled 2016-02-19 (×3): qty 1

## 2016-02-19 NOTE — Progress Notes (Signed)
Delay in noon Metoprolol to accommodate soft blood pressure. Improvement at 1300, metoprolol provided.

## 2016-02-19 NOTE — Progress Notes (Signed)
Benefit check for Eliquis and Xarelto Memory Argue CMA        S/W Paris Lore @ [facility] # [phone]   XARELTO  15 MG DAILY   COVER- YES  CO-PAY- $45.00  TIER- 3 DRUG  PRIOR APPROVAL- NO  PHARMACY : [facility]            S/W JIAIR  @ [facility]  # [phone]   ELIQUIS 5 MG BID   COVER- YES  CO-PAY- $ 45.00  TIER- 3 DRUG  PRIOR APPROVAL-  NO  PHARMACY : [facility]

## 2016-02-19 NOTE — Progress Notes (Signed)
Amiodarone documentation corrected with Aris Everts RN as she discovered error in her 2053 documentation/rate adjustment I&O calculations.

## 2016-02-19 NOTE — Progress Notes (Signed)
Help midnight dose of metoprolol, MD notified. Patient stable.

## 2016-02-19 NOTE — Progress Notes (Addendum)
Patient Name: [name] [name] Date of Encounter: 02/19/2016  Primary Cardiologist: Dr. Lorretta Harp Problem List     Principal Problem:   Atrial fibrillation with rapid ventricular response [facility]) Active Problems:   Hyperlipidemia   Essential hypertension   Chronic cough   Gastroesophageal reflux disease     Subjective   Patient denies any SOB or CP but is aware of her heart beating fast.  Inpatient Medications    Scheduled Meds: . apixaban  5 mg Oral BID  . buPROPion  150 mg Oral Daily  . famotidine  40 mg Oral Daily  . metoprolol tartrate  12.5 mg Oral Q6H  . mometasone-formoterol  2 puff Inhalation BID  . pravastatin  40 mg Oral QHS   Continuous Infusions: . amiodarone 30 mg/hr (02/19/16 0303)   PRN Meds: acetaminophen, ondansetron (ZOFRAN) IV   Vital Signs    Vitals:   02/19/16 0000 02/19/16 0120 02/19/16 0123 02/19/16 0549  BP: (!) 86/51 (!) 78/49 (!) 81/48 109/73  Pulse: (!) 107 (!) 109  (!) 106  Resp: 16   16  Temp: 98.2 F (36.8 C)   97.2 F (36.2 C)  TempSrc: Oral   Oral  SpO2: 98%   98%  Weight:    220 lb 3.2 oz (99.9 kg)  Height:        Intake/Output Summary (Last 24 hours) at 02/19/16 1004 Last data filed at 02/19/16 0706  Gross per 24 hour  Intake           686.81 ml  Output              850 ml  Net          -163.19 ml   Filed Weights   02/17/16 1654 02/18/16 0607 02/19/16 0549  Weight: 221 lb 8 oz (100.5 kg) 220 lb 9.6 oz (100.1 kg) 220 lb 3.2 oz (99.9 kg)    Physical Exam    GEN: Well nourished, well developed, in no acute distress.  HEENT: Grossly normal.  Neck: Supple, no JVD, carotid bruits, or masses. Cardiac: irregularly irregular and tachy, no murmurs, rubs, or gallops. No clubbing, cyanosis, edema.  Radials/DP/PT 2+ and equal bilaterally.  Respiratory:  Respirations regular and unlabored, clear to auscultation bilaterally. GI: Soft, nontender, nondistended, BS + x 4. MS: no deformity or atrophy. Skin: warm and dry, no  rash. Neuro:  Strength and sensation are intact. Psych: AAOx3.  Normal affect.  Labs    CBC  Recent Labs  02/17/16 1644  WBC 7.9  NEUTROABS 3.0  HGB 14.1  HCT 42.4  MCV 92.0  PLT 123456   Basic Metabolic Panel  Recent Labs  02/17/16 1644 02/18/16 0454  NA 141 141  K 3.3* 3.6  CL 103 104  CO2 27 26  GLUCOSE 92 115*  BUN 14 13  CREATININE 1.41* 1.25*  CALCIUM 9.3 9.2  MG 1.7  --    Liver Function Tests  Recent Labs  02/17/16 1644  AST 16  ALT 10*  ALKPHOS 52  BILITOT 0.5  PROT 6.7  ALBUMIN 3.9   No results for input(s): LIPASE, AMYLASE in the last 72 hours. Cardiac Enzymes No results for input(s): CKTOTAL, CKMB, CKMBINDEX, TROPONINI in the last 72 hours. BNP Invalid input(s): POCBNP D-Dimer No results for input(s): DDIMER in the last 72 hours. Hemoglobin A1C  Recent Labs  02/17/16 1644  HGBA1C 5.8*   Fasting Lipid Panel  Recent Labs  02/18/16 0454  CHOL 125  HDL 41  LDLCALC 66  TRIG 90  CHOLHDL 3.0   Thyroid Function Tests  Recent Labs  02/18/16 1310  TSH 1.852    Telemetry    Atrial fibrillation with RVR - Personally Reviewed  ECG    Atrial fibrillation with RVR - Personally Reviewed  Radiology    Dg Chest 2 View  Result Date: 02/17/2016 CLINICAL DATA:  Atrial fibrillation EXAM: CHEST  2 VIEW COMPARISON:  01/17/2016 FINDINGS: Stable cardiomegaly with aortic atherosclerosis. No aneurysm. Minimal atelectasis at each lung base. No pneumonic consolidation, CHF, pneumothorax nor effusion. No suspicious osseous abnormalities. IMPRESSION: No active stable cardiomegaly with mild aortic atherosclerosis. Electronically Signed   By: Ashley Royalty M.D.   On: 02/17/2016 23:56    Cardiac Studies   Study Conclusions  - Left ventricle: The cavity size was normal. Wall thickness was   normal. Systolic function was normal. The estimated ejection   fraction was in the range of 50% to 55%. Wall motion was normal;   there were no regional wall  motion abnormalities. - Mitral valve: Calcified annulus. There was mild regurgitation. - Left atrium: The atrium was moderately dilated.   Anterior-posterior dimension: 48 mm. - Right ventricle: The cavity size was mildly dilated. Wall   thickness was normal. - Right atrium: The atrium was severely dilated. - Tricuspid valve: There was mild regurgitation. - Pulmonary arteries: Systolic pressure was mildly increased. PA   peak pressure: 37 mm Hg (S).   Patient Profile     [phone] WF with a history of chronic kidney diseasestage III, hypertension, hyperlipidemia, sleep apnea, and borderline diabetes who presented to Dr. Deterding's office for routine follow-up of CKD this morning and was noted to be tachycardia with an irregularly irregular rhythm.Patient reports that she has had a chronic cough for the last 2 months. She was evaluated by her PCP in early January, at which time the patient reports her heart rate was noted to be normal. However, just a week or so later, [name] [name] states her heart rate was around 140 when being evaluated by orthopedics for knee pain. Since that time, she has monitored her heart rate at home, noting that it is occasionally in the 70s to 80s but also much higher at times. She does not feel any palpitationsand also denies a history of arrhythmias. She has intermittent vague chest tightness lasting only a brief moment. This happens about twice a month, having last been present week ago. She was admitted to [facility] for further control of her afib with RVR.  Assessment & Plan    1.  Atrial fibrillation with rapid ventricular response: exact duration is uncertain, though most likely has been present for at least 3-4 weeks as son reports of heart rate near 140 noted during orthopedics visit in early to mid-January. Patient's only symptoms are of orthostatic lightheadedness and fatigue.  Chest x-ray on 01/17/16 demonstrated mild cardiomegaly and due to concern for possible  tachycardia induced CM. She was admitted for rate control and further workup of her atrial fibrillation.  Continue apixaban 5 mg twice a day for anticoagulation.  Rate fairly well controlled on BB therapy and IV Amio with HR in the 80's -100's.  Cannot increase BB further due to soft BP. Will change IV Amio to PO 200mg  BID.    LFTs and TSH are normal.  Will get baseline PFTs with DLCO for Amio.  Follow daily EKG for QTc   2D echo showed normal LVF EF 50-55% wit mild MR  and moderate LAE with an LA size of 31mm, mildly dilated RV and severe RAE with mild TR and mild pulmonary HTN.  I reviewed echo personally and EF around 50% with moderately enlarged RA/RV with mild RV dysfunction.     Would prefer to avoid TEE unless absolutely necessary due to history of significant GERD and Nissen fundoplication although it was years ago   2.  Essential hypertension:  Blood pressure soft today.  Holding amlodipine and valsartan-HCTZ in the setting of a-fib with RVR and soft BP.   3.  Hyperlipidemia: Patient on pravastatin at home.  Check fasting lipid panel and hemoglobin A1c for risk stratification.  4.  GERD:  Continue famotidine.  5.  Chronic cough: Likely multifactorial but could also be due to atrial fibrillation over the last few months, in addition to GERD and underlying lung disease.  Continue home bronchodilator and allergy regimen (with the exception of chlorpheniramine, which has potential to precipitate arrhythmias)  Continue with GERD therapy.  Signed, Fransico Him, MD  02/19/2016, 10:04 AM

## 2016-02-20 LAB — BASIC METABOLIC PANEL
Anion gap: 9 (ref 5–15)
BUN: 17 mg/dL (ref 6–20)
CALCIUM: 9.6 mg/dL (ref 8.9–10.3)
CO2: 30 mmol/L (ref 22–32)
CREATININE: 1.35 mg/dL — AB (ref 0.44–1.00)
Chloride: 102 mmol/L (ref 101–111)
GFR, EST AFRICAN AMERICAN: 45 mL/min — AB (ref >60)
GFR, EST NON AFRICAN AMERICAN: 39 mL/min — AB (ref >60)
Glucose, Bld: 131 mg/dL — ABNORMAL HIGH (ref 65–99)
Potassium: 3.9 mmol/L (ref 3.5–5.1)
SODIUM: 141 mmol/L (ref 135–145)

## 2016-02-20 MED ORDER — AMIODARONE HCL 200 MG PO TABS: 200.0000 mg | ORAL_TABLET | Freq: Two times a day (BID) | ORAL | 1 refills | Status: DC

## 2016-02-20 MED ORDER — METOPROLOL TARTRATE 25 MG PO TABS: 25.0000 mg | ORAL_TABLET | Freq: Two times a day (BID) | ORAL | 6 refills | Status: DC

## 2016-02-20 MED ORDER — APIXABAN 5 MG PO TABS: 5.0000 mg | ORAL_TABLET | Freq: Two times a day (BID) | ORAL | 6 refills | Status: DC

## 2016-02-20 MED ORDER — METOPROLOL TARTRATE 25 MG PO TABS: 25.0000 mg | ORAL_TABLET | Freq: Three times a day (TID) | ORAL | 6 refills | Status: DC

## 2016-02-20 MED ORDER — METOPROLOL TARTRATE 25 MG PO TABS: 12.5000 mg | ORAL_TABLET | Freq: Four times a day (QID) | ORAL | 6 refills | Status: DC

## 2016-02-20 NOTE — Care Management Important Message (Signed)
Important Message  Patient Details  Name: [name] [name] MRN: [id number] Date of Birth: 09/15/1945   Medicare Important Message Given:  Yes    Solash Tullo [address] 02/20/2016, 11:48 AM

## 2016-02-20 NOTE — Progress Notes (Addendum)
Spoke with Dr Michail Sermon by phone. He did an EGD on her in 2011 and colonoscopies in 2013 and 2016.  Per his review of his notes, there was no difficulty with the upper endoscopy. If she is not having dysphagia, she is not at increased risk of complications from the procedure.   Her procedure is on Feb 12 at 9:00 am, she needs to be at admitting at 7:45 am.   [name] [name] 02/20/2016 12:18 PM Beeper [phone]

## 2016-02-20 NOTE — Progress Notes (Addendum)
Patient Name: [name] [name] Date of Encounter: 02/20/2016  Primary Cardiologist: Dr. Lorretta Harp Problem List     Principal Problem:   Atrial fibrillation with rapid ventricular response [facility]) Active Problems:   Hyperlipidemia   Essential hypertension   Chronic cough   Gastroesophageal reflux disease     Subjective   Patient denies any SOB or CP   Inpatient Medications    Scheduled Meds: . amiodarone  200 mg Oral BID  . apixaban  5 mg Oral BID  . buPROPion  150 mg Oral Daily  . famotidine  40 mg Oral Daily  . metoprolol tartrate  12.5 mg Oral Q6H  . mometasone-formoterol  2 puff Inhalation BID  . pravastatin  40 mg Oral QHS   Continuous Infusions:  PRN Meds: acetaminophen, ondansetron (ZOFRAN) IV   Vital Signs    Vitals:   02/19/16 2011 02/19/16 2356 02/20/16 0527 02/20/16 0916  BP:  103/71 103/63 (!) 95/59  Pulse:  (!) 114 (!) 107 (!) 111  Resp:  18 18 18   Temp:  97.8 F (36.6 C) 97.9 F (36.6 C) 97.9 F (36.6 C)  TempSrc:  Oral Oral Oral  SpO2: 98% 100% 97% 94%  Weight:   221 lb 6.4 oz (100.4 kg)   Height:        Intake/Output Summary (Last 24 hours) at 02/20/16 0934 Last data filed at 02/20/16 0500  Gross per 24 hour  Intake           554.32 ml  Output             1300 ml  Net          -745.68 ml   Filed Weights   02/18/16 0607 02/19/16 0549 02/20/16 0527  Weight: 220 lb 9.6 oz (100.1 kg) 220 lb 3.2 oz (99.9 kg) 221 lb 6.4 oz (100.4 kg)    Physical Exam    GEN: Well nourished, well developed, in no acute distress.  HEENT: Grossly normal.  Neck: Supple, no JVD, carotid bruits, or masses. Cardiac: irregularly irregular and tachy, no murmurs, rubs, or gallops. No clubbing, cyanosis, edema.  Radials/DP/PT 2+ and equal bilaterally.  Respiratory:  Respirations regular and unlabored, clear to auscultation bilaterally. GI: Soft, nontender, nondistended, BS + x 4. MS: no deformity or atrophy. Skin: warm and dry, no rash. Neuro:  Strength and  sensation are intact. Psych: AAOx3.  Normal affect.  Labs    CBC  Recent Labs  02/17/16 1644  WBC 7.9  NEUTROABS 3.0  HGB 14.1  HCT 42.4  MCV 92.0  PLT 123456   Basic Metabolic Panel  Recent Labs  02/17/16 1644 02/18/16 0454  NA 141 141  K 3.3* 3.6  CL 103 104  CO2 27 26  GLUCOSE 92 115*  BUN 14 13  CREATININE 1.41* 1.25*  CALCIUM 9.3 9.2  MG 1.7  --    Liver Function Tests  Recent Labs  02/17/16 1644  AST 16  ALT 10*  ALKPHOS 52  BILITOT 0.5  PROT 6.7  ALBUMIN 3.9   No results for input(s): LIPASE, AMYLASE in the last 72 hours. Cardiac Enzymes No results for input(s): CKTOTAL, CKMB, CKMBINDEX, TROPONINI in the last 72 hours. BNP Invalid input(s): POCBNP D-Dimer No results for input(s): DDIMER in the last 72 hours. Hemoglobin A1C  Recent Labs  02/17/16 1644  HGBA1C 5.8*   Fasting Lipid Panel  Recent Labs  02/18/16 0454  CHOL 125  HDL 41  LDLCALC 66  TRIG 90  CHOLHDL 3.0   Thyroid Function Tests  Recent Labs  02/18/16 1310  TSH 1.852    Telemetry    Atrial fibrillation with RVR - Personally Reviewed  ECG    Atrial fibrillation with RVR - Personally Reviewed  Radiology    No results found.  Cardiac Studies   Study Conclusions  - Left ventricle: The cavity size was normal. Wall thickness was   normal. Systolic function was normal. The estimated ejection   fraction was in the range of 50% to 55%. Wall motion was normal;   there were no regional wall motion abnormalities. - Mitral valve: Calcified annulus. There was mild regurgitation. - Left atrium: The atrium was moderately dilated.   Anterior-posterior dimension: 48 mm. - Right ventricle: The cavity size was mildly dilated. Wall   thickness was normal. - Right atrium: The atrium was severely dilated. - Tricuspid valve: There was mild regurgitation. - Pulmonary arteries: Systolic pressure was mildly increased. PA   peak pressure: 37 mm Hg (S).   Patient Profile       [phone] WF with a history of chronic kidney diseasestage III, hypertension, hyperlipidemia, sleep apnea, and borderline diabetes who presented to Dr. Deterding's office for routine follow-up of CKD this morning and was noted to be tachycardia with an irregularly irregular rhythm.Patient reports that she has had a chronic cough for the last 2 months. She was evaluated by her PCP in early January, at which time the patient reports her heart rate was noted to be normal. However, just a week or so later, Ms. Chirco states her heart rate was around 140 when being evaluated by orthopedics for knee pain. Since that time, she has monitored her heart rate at home, noting that it is occasionally in the 70s to 80s but also much higher at times. She does not feel any palpitationsand also denies a history of arrhythmias. She has intermittent vague chest tightness lasting only a brief moment. This happens about twice a month, having last been present week ago. She was admitted to [facility] for further control of her afib with RVR.  Assessment & Plan    1.  Atrial fibrillation with rapid ventricular response: exact duration is uncertain, though most likely has been present for at least 3-4 weeks as son reports of heart rate near 140 noted during orthopedics visit in early to mid-January. Patient's only symptoms are of orthostatic lightheadedness and fatigue.  Chest x-ray on 01/17/16 demonstrated mild cardiomegaly and due to concern for possible tachycardia induced CM. She was admitted for rate control and further workup of her atrial fibrillation.  Continue apixaban 5 mg twice a day for anticoagulation.  Appreciate care management consult. It appears Eliquis and Xarelto are both Tier 3 and covered by insurance with copay of $45 per month. SHe will stay on Eliquis  Rate fairly well controlled on BB therapy and PO Amio with HR in the 80's -100's.    LFTs and TSH are normal.  Baseline PFTs with DLCO for Amio are  pending.  Follow daily EKG for QTc - pending this am  Check BMET today   2D echo showed normal LVF EF 50-55% wit mild MR and moderate LAE with an LA size of 50mm, mildly dilated RV and severe RAE with mild TR and mild pulmonary HTN.  I reviewed echo personally and EF around 50% with moderately enlarged RA/RV with mild RV dysfunction.     Would prefer to avoid TEE unless absolutely necessary due to  history of significant GERD and Nissen fundoplication although it was years ago but HR remains elevated with mild LV dysfunction and would prefer not to have her rate high for 3 weeks while waiting on adequate anticoagulation.  The schedule is full in endoscopy today and tomorrow so will see if we can get her on Monday as an outpt and discharge home today. I will discuss with GI to make sure they are ok with doing TEE.    2.  Essential hypertension:  Blood pressure remains soft today.  Holding amlodipine and valsartan-HCTZ   3.  Hyperlipidemia: Patient on pravastatin at home.  Hemoglobin A1c 5.8 and LDL 66.  4.  GERD:  Continue famotidine.  5.  Chronic cough: Likely multifactorial but could also be due to atrial fibrillation over the last few months, in addition to GERD and underlying lung disease.  Continue home bronchodilator and allergy regimen (with the exception of chlorpheniramine, which has potential to precipitate arrhythmias)  Continue with GERD therapy.  Signed, Fransico Him, MD  02/20/2016, 9:34 AM

## 2016-02-20 NOTE — Discharge Summary (Signed)
Discharge Summary    Patient ID: [name] [name],  MRN: [id number], DOB/AGE: 08/02/1945 71 y.o.  Admit date: 02/17/2016 Discharge date: 02/20/2016  Primary Care Provider: WHITE,[name] S Primary Cardiologist: Dr. Saunders Revel    Discharge Diagnoses    Principal Problem:   Atrial fibrillation with rapid ventricular response [facility]) Active Problems:   Hyperlipidemia   Essential hypertension   Chronic cough   Gastroesophageal reflux disease   Allergies Allergies  Allergen Reactions  . Lexapro [Escitalopram]     Makes her feel funny  . Peroxide [Hydrogen Peroxide] Other (See Comments)    Redness.   [place] [Sertraline Hcl]     Makes her feel funny  . Neosporin [Neomycin-Bacitracin Zn-Polymyx] Rash    Blisters, itching.      History of Present Illness     [phone] WF with a history of chronic kidney diseasestage III, hypertension, hyperlipidemia, sleep apnea, and borderline diabetes who presented to Dr. Deterding's office for routine follow-up of CKD on 02/17/16 and was noted to be tachycardia with an irregularly irregular rhythm.She was seen by Dr. Saunders Revel in the office and admitted to [facility] for further work up.   Patient reports that she has had a chronic cough for the last 2 months. She was evaluated by her PCP in early January, at which time the patient reports her heart rate was noted to be normal. However, just a week or so later, [name] [name] states her heart rate was around 140 when being evaluated by orthopedics for knee pain. Since that time, she has monitored her heart rate at home, noting that it is occasionally in the 70s to 80s but also much higher at times. She does not feel any palpitationsand also denies a history of arrhythmias. She has intermittent vague chest tightness lasting only a brief moment. This happens about twice a month, having last been present week ago.     Hospital Course     Consultants: none  1.  Atrial fibrillation with rapid ventricular response (new onset):  exact duration is uncertain, though most likely has been present for at least 3-4 weeks as son reports of heart rate near 140 noted during orthopedics visit in early to mid-January. Patient's only symptoms are of orthostatic lightheadedness and fatigue.  Chest x-ray on 01/17/16 demonstrated mild cardiomegaly and due to concern for possible tachycardia induced CM. She was admitted for rate control and further workup of her atrial fibrillation.  Continue apixaban5 mg twice a day for anticoagulation.  CHADSVASC of at least 3 (HTN, age, Fsex). Appreciate care management consult. It appears Eliquis and Xarelto are both Tier 3 and covered by insurance with copay of $45 per month. SHe will stay on Eliquis  Rate fairly well controlled on Lopressor 12.5mg  BID and PO Amio 200mg  BID with HR in the 80's -100's.    LFTs and TSH are normal.  Baseline PFTs with DLCO for Amio are pending   2D echo showed normal LVF EF 50-55% wit mild MR and moderate LAE with an LA size of 76mm, mildly dilated RV and severe RAE with mild TR and mild pulmonary HTN.  Dr. Radford Pax reviewed echo personally and EF around 50% with moderately enlarged RA/RV with mild RV dysfunction.     TEE/DCCV planned for Monday as an outpatient (due to scheduling issues). TEE cleared with GI given hx of significant GERD and nissan fundoplication. She will be discharged home today and return Monday for outpatient procedure.   2.  Essential hypertension:  Blood pressure remains soft today.  Holding amlodipine and valsartan-HCTZ, will hold at discharge as well   3.  Hyperlipidemia: Patient on pravastatin at home.  Hemoglobin A1c 5.8 and LDL 66.  4.  GERD:  Continue famotidine.  5.  Chronic cough: Likely multifactorial but could also be due to atrial fibrillation over the last few months, in addition to GERD and underlying lung disease.  Continue home bronchodilator and allergy regimen (with the exception of chlorpheniramine, which has  potential to precipitate arrhythmias)  Continue with GERD therapy.     The patient has had an uncomplicated hospital course and is recovering well.  She has been seen by Dr. Radford Pax today and deemed ready for discharge home. Discharge medications are listed below.  _____________  Discharge Vitals Blood pressure 114/68, pulse (!) 119, temperature 97.9 F (36.6 C), temperature source Oral, resp. rate 18, height 5\' 7"  (1.702 m), weight 221 lb 6.4 oz (100.4 kg), SpO2 100 %.  Filed Weights   02/18/16 0607 02/19/16 0549 02/20/16 0527  Weight: 220 lb 9.6 oz (100.1 kg) 220 lb 3.2 oz (99.9 kg) 221 lb 6.4 oz (100.4 kg)    Labs & Radiologic Studies     CBC No results for input(s): WBC, NEUTROABS, HGB, HCT, MCV, PLT in the last 72 hours. Basic Metabolic Panel  Recent Labs  02/18/16 0454 02/20/16 1014  NA 141 141  K 3.6 3.9  CL 104 102  CO2 26 30  GLUCOSE 115* 131*  BUN 13 17  CREATININE 1.25* 1.35*  CALCIUM 9.2 9.6   Liver Function Tests No results for input(s): AST, ALT, ALKPHOS, BILITOT, PROT, ALBUMIN in the last 72 hours. No results for input(s): LIPASE, AMYLASE in the last 72 hours. Cardiac Enzymes No results for input(s): CKTOTAL, CKMB, CKMBINDEX, TROPONINI in the last 72 hours. BNP Invalid input(s): POCBNP D-Dimer No results for input(s): DDIMER in the last 72 hours. Hemoglobin A1C No results for input(s): HGBA1C in the last 72 hours. Fasting Lipid Panel  Recent Labs  02/18/16 0454  CHOL 125  HDL 41  LDLCALC 66  TRIG 90  CHOLHDL 3.0   Thyroid Function Tests  Recent Labs  02/18/16 1310  TSH 1.852    Dg Chest 2 View  Result Date: 02/17/2016 CLINICAL DATA:  Atrial fibrillation EXAM: CHEST  2 VIEW COMPARISON:  01/17/2016 FINDINGS: Stable cardiomegaly with aortic atherosclerosis. No aneurysm. Minimal atelectasis at each lung base. No pneumonic consolidation, CHF, pneumothorax nor effusion. No suspicious osseous abnormalities. IMPRESSION: No active stable  cardiomegaly with mild aortic atherosclerosis. Electronically Signed   By: Ashley Royalty M.D.   On: 02/17/2016 23:56     Diagnostic Studies/Procedures    2D ECHO: 02/18/2016 LV EF: 50% -   55% Study Conclusions - Left ventricle: The cavity size was normal. Wall thickness was   normal. Systolic function was normal. The estimated ejection   fraction was in the range of 50% to 55%. Wall motion was normal;   there were no regional wall motion abnormalities. - Mitral valve: Calcified annulus. There was mild regurgitation. - Left atrium: The atrium was moderately dilated.   Anterior-posterior dimension: 48 mm. - Right ventricle: The cavity size was mildly dilated. Wall   thickness was normal. - Right atrium: The atrium was severely dilated. - Tricuspid valve: There was mild regurgitation. - Pulmonary arteries: Systolic pressure was mildly increased. PA   peak pressure: 37 mm Hg (S). _____________    Disposition   Pt is being discharged home today in  good condition.  Follow-up Plans & Appointments    Follow-up Information    [facility] Follow up on 02/24/2016.   Why:  Please arrive at 7:45 am for a 9:00 am procedure. Contact information: [address] [id number] [place] [phone]           Discharge Medications     Medication List    STOP taking these medications   chlorpheniramine 4 MG tablet Commonly known as:  CHLOR-TRIMETON   valsartan-hydrochlorothiazide 320-25 MG tablet Commonly known as:  DIOVAN-HCT     TAKE these medications   acetaminophen 500 MG tablet Commonly known as:  TYLENOL Take 500 mg by mouth every 6 (six) hours as needed. For pain   amiodarone 200 MG tablet Commonly known as:  PACERONE Take 1 tablet (200 mg total) by mouth 2 (two) times daily.   amLODipine 10 MG tablet Commonly known as:  NORVASC Take 5 mg by mouth every morning.   apixaban 5 MG Tabs tablet Commonly  known as:  ELIQUIS Take 1 tablet (5 mg total) by mouth 2 (two) times daily.   aspirin EC 81 MG tablet Take 81 mg by mouth every morning.   budesonide-formoterol 160-4.5 MCG/ACT inhaler Commonly known as:  SYMBICORT Inhale 2 puffs into the lungs 2 (two) times daily.   buPROPion 150 MG 24 hr tablet Commonly known as:  WELLBUTRIN XL Take 150 mg by mouth every morning.   CALCIUM PO Take 1 tablet by mouth every morning.   famotidine 40 MG tablet Commonly known as:  PEPCID Take 1 tablet (40 mg total) by mouth daily.   fluticasone 50 MCG/ACT nasal spray Commonly known as:  FLONASE Place 2 sprays into both nostrils daily.   metoprolol tartrate 25 MG tablet Commonly known as:  LOPRESSOR Take 0.5 tablets (12.5 mg total) by mouth every 6 (six) hours.   pravastatin 40 MG tablet Commonly known as:  PRAVACHOL Take 40 mg by mouth at bedtime.   vitamin C with rose hips 1000 MG tablet Take 1,000 mg by mouth daily.   Vitamin D 2000 units tablet Take 4,000 Units by mouth daily.         Outstanding Labs/Studies   TEE/DCCV on 02/24/16  Duration of Discharge Encounter   Greater than 30 minutes including physician time.  Signed, Angelena Form PA-C 02/20/2016, 6:31 PM

## 2016-02-21 ENCOUNTER — Other Ambulatory Visit: Payer: Self-pay | Admitting: Physician Assistant

## 2016-02-21 ENCOUNTER — Ambulatory Visit (HOSPITAL_COMMUNITY)
Admission: RE | Admit: 2016-02-21 | Discharge: 2016-02-21 | Disposition: A | Discharge status: Home / Self Care | Payer: Medicare Other | Source: Ambulatory Visit | Attending: Cardiology | Admitting: Cardiology

## 2016-02-21 DIAGNOSIS — I4891 Unspecified atrial fibrillation: Secondary | ICD-10-CM | POA: Diagnosis present

## 2016-02-21 LAB — PULMONARY FUNCTION TEST
DL/VA % pred: 109 %
DL/VA: 5.65 ml/min/mmHg/L
DLCO UNC % PRED: 82 %
DLCO UNC: 23.3 ml/min/mmHg
DLCO cor % pred: 80 %
DLCO cor: 22.83 ml/min/mmHg
FEF 25-75 Pre: 1.27 L/sec
FEF2575-%Pred-Pre: 61 %
FEV1-%PRED-PRE: 62 %
FEV1-PRE: 1.59 L
FEV1FVC-%PRED-PRE: 101 %
FEV6-%PRED-PRE: 63 %
FEV6-Pre: 2.06 L
FEV6FVC-%Pred-Pre: 104 %
FVC-%Pred-Pre: 61 %
FVC-Pre: 2.06 L
PRE FEV1/FVC RATIO: 77 %
Pre FEV6/FVC Ratio: 100 %
RV % PRED: 117 %
RV: 2.76 L
TLC % pred: 96 %
TLC: 5.31 L

## 2016-02-24 ENCOUNTER — Encounter (HOSPITAL_COMMUNITY)
Admission: RE | Disposition: A | Discharge status: Home / Self Care | Payer: Self-pay | Source: Ambulatory Visit | Attending: Internal Medicine

## 2016-02-24 ENCOUNTER — Encounter (HOSPITAL_COMMUNITY): Payer: Self-pay

## 2016-02-24 ENCOUNTER — Ambulatory Visit (HOSPITAL_COMMUNITY): Payer: Medicare Other | Admitting: Certified Registered Nurse Anesthetist

## 2016-02-24 ENCOUNTER — Ambulatory Visit (HOSPITAL_COMMUNITY)
Admission: RE | Admit: 2016-02-24 | Discharge: 2016-02-24 | Disposition: A | Discharge status: Home / Self Care | Payer: Medicare Other | Source: Ambulatory Visit | Attending: Internal Medicine | Admitting: Internal Medicine

## 2016-02-24 ENCOUNTER — Other Ambulatory Visit: Payer: Self-pay

## 2016-02-24 ENCOUNTER — Ambulatory Visit (HOSPITAL_BASED_OUTPATIENT_CLINIC_OR_DEPARTMENT_OTHER)
Admission: RE | Admit: 2016-02-24 | Discharge: 2016-02-24 | Disposition: A | Discharge status: Home / Self Care | Payer: Medicare Other | Source: Ambulatory Visit | Attending: Physician Assistant | Admitting: Physician Assistant

## 2016-02-24 DIAGNOSIS — R05 Cough: Secondary | ICD-10-CM | POA: Insufficient documentation

## 2016-02-24 DIAGNOSIS — Z79899 Other long term (current) drug therapy: Secondary | ICD-10-CM | POA: Insufficient documentation

## 2016-02-24 DIAGNOSIS — E785 Hyperlipidemia, unspecified: Secondary | ICD-10-CM | POA: Insufficient documentation

## 2016-02-24 DIAGNOSIS — M1990 Unspecified osteoarthritis, unspecified site: Secondary | ICD-10-CM | POA: Diagnosis not present

## 2016-02-24 DIAGNOSIS — R7303 Prediabetes: Secondary | ICD-10-CM | POA: Diagnosis not present

## 2016-02-24 DIAGNOSIS — Z7901 Long term (current) use of anticoagulants: Secondary | ICD-10-CM | POA: Diagnosis not present

## 2016-02-24 DIAGNOSIS — N183 Chronic kidney disease, stage 3 (moderate): Secondary | ICD-10-CM | POA: Diagnosis not present

## 2016-02-24 DIAGNOSIS — I4891 Unspecified atrial fibrillation: Secondary | ICD-10-CM

## 2016-02-24 DIAGNOSIS — G4730 Sleep apnea, unspecified: Secondary | ICD-10-CM | POA: Diagnosis not present

## 2016-02-24 DIAGNOSIS — I129 Hypertensive chronic kidney disease with stage 1 through stage 4 chronic kidney disease, or unspecified chronic kidney disease: Secondary | ICD-10-CM | POA: Diagnosis not present

## 2016-02-24 DIAGNOSIS — I272 Pulmonary hypertension, unspecified: Secondary | ICD-10-CM | POA: Diagnosis not present

## 2016-02-24 DIAGNOSIS — K219 Gastro-esophageal reflux disease without esophagitis: Secondary | ICD-10-CM | POA: Diagnosis not present

## 2016-02-24 DIAGNOSIS — F329 Major depressive disorder, single episode, unspecified: Secondary | ICD-10-CM | POA: Diagnosis not present

## 2016-02-24 DIAGNOSIS — I340 Nonrheumatic mitral (valve) insufficiency: Secondary | ICD-10-CM | POA: Diagnosis not present

## 2016-02-24 HISTORY — PX: TEE WITHOUT CARDIOVERSION: SHX5443

## 2016-02-24 HISTORY — PX: CARDIOVERSION: SHX1299

## 2016-02-24 SURGERY — ECHOCARDIOGRAM, TRANSESOPHAGEAL
Anesthesia: General

## 2016-02-24 MED ORDER — BUTAMBEN-TETRACAINE-BENZOCAINE 2-2-14 % EX AERO
INHALATION_SPRAY | CUTANEOUS | Status: DC | PRN
  Administered 2016-02-24: 1 via TOPICAL

## 2016-02-24 MED ORDER — PROPOFOL 10 MG/ML IV BOLUS
INTRAVENOUS | Status: DC | PRN
  Administered 2016-02-24: 20 mg via INTRAVENOUS

## 2016-02-24 MED ORDER — SODIUM CHLORIDE 0.9 % IV SOLN
INTRAVENOUS | Status: DC
  Administered 2016-02-24: 09:00:00 via INTRAVENOUS
  Administered 2016-02-24: 500 mL via INTRAVENOUS

## 2016-02-24 MED ORDER — EPHEDRINE SULFATE 50 MG/ML IJ SOLN
INTRAMUSCULAR | Status: DC | PRN
  Administered 2016-02-24 (×2): 5 mg via INTRAVENOUS

## 2016-02-24 MED ORDER — PROPOFOL 500 MG/50ML IV EMUL
INTRAVENOUS | Status: DC | PRN
  Administered 2016-02-24: 100 ug/kg/min via INTRAVENOUS

## 2016-02-24 NOTE — Interval H&P Note (Signed)
History and Physical Interval Note:  02/24/2016 8:12 AM  [name] [name]  has presented today for surgery, with the diagnosis of AFIB  The various methods of treatment have been discussed with the patient and family. After consideration of risks, benefits and other options for treatment, the patient has consented to  Procedure(s): TRANSESOPHAGEAL ECHOCARDIOGRAM (TEE) (N/A) CARDIOVERSION (N/A) as a surgical intervention .  The patient's history has been reviewed, patient examined, no change in status, stable for surgery.  I have reviewed the patient's chart and labs.  Questions were answered to the patient's satisfaction.     [name]

## 2016-02-24 NOTE — Progress Notes (Signed)
  Echocardiogram Echocardiogram Transesophageal has been performed.  [name] [name] 02/24/2016, 9:42 AM

## 2016-02-24 NOTE — Op Note (Signed)
LA, LA appendage without masses LA large Suspicious for small PFO by color doppler TV is normal  Mod TR MV is mildly thickend  MR is at least moderate AV is mildly thickened.  Trivial AI PV is normal  No PI LVEF is mildly depressed Mild fixed plaquing of the thoraicic aorta.

## 2016-02-24 NOTE — Transfer of Care (Signed)
Immediate Anesthesia Transfer of Care Note  Patient: [name] [name]  Procedure(s) Performed: Procedure(s): TRANSESOPHAGEAL ECHOCARDIOGRAM (TEE) (N/A) CARDIOVERSION (N/A)  Patient Location: Endoscopy Unit  Anesthesia Type:General  Level of Consciousness: awake, alert  and oriented  Airway & Oxygen Therapy: Patient Spontanous Breathing and Patient connected to nasal cannula oxygen  Post-op Assessment: Report given to RN and Post -op Vital signs reviewed and stable  Post vital signs: Reviewed and stable  Last Vitals:  Vitals:   02/24/16 0820 02/24/16 0927  BP: 123/70   Pulse: (!) 131 (!) 54  Resp: 15 (!) 22  Temp: 37.1 C     Last Pain:  Vitals:   02/24/16 0820  TempSrc: Oral         Complications: No apparent anesthesia complications

## 2016-02-24 NOTE — Progress Notes (Signed)
Patient continues to have soft blood pressures on both arms with no symptoms. Dr. Royce Macadamia was notified. A manuel blood pressure was taken and it was 88/58.

## 2016-02-24 NOTE — Progress Notes (Signed)
Patient blood pressure 88/48, sitting up drinking diet coke. Not complaining of lightheadedness, dizziness, and completely awake.

## 2016-02-24 NOTE — Op Note (Signed)
Pateint sedated by anesthesia with Propofol   With pads in AP postion ptt cardioverted to SR with 200 J synchronized biphasic energy Procedure without complication Tele showed SB with PACs.

## 2016-02-24 NOTE — Anesthesia Preprocedure Evaluation (Signed)
Anesthesia Evaluation  Patient identified by MRN, date of birth, ID band Patient awake    Reviewed: Allergy & Precautions, NPO status , Patient's Chart, lab work & pertinent test results, reviewed documented beta blocker date and time   Airway Mallampati: II  TM Distance: >3 FB Neck ROM: Full    Dental  (+) Teeth Intact, Caps   Pulmonary sleep apnea and Continuous Positive Airway Pressure Ventilation ,    Pulmonary exam normal breath sounds clear to auscultation       Cardiovascular hypertension, Pt. on medications and Pt. on home beta blockers Normal cardiovascular exam Rhythm:Irregular Rate:Normal     Neuro/Psych PSYCHIATRIC DISORDERS Anxiety Depression negative neurological ROS     GI/Hepatic Neg liver ROS, GERD  Medicated and Controlled,  Endo/Other  Hx/o Hyperlipidemia  Renal/GU Renal InsufficiencyRenal disease  negative genitourinary   Musculoskeletal  (+) Arthritis , Osteoarthritis,    Abdominal (+) + obese,   Peds  Hematology On Eliquis- last dose this am   Anesthesia Other Findings   Reproductive/Obstetrics                             Anesthesia Physical Anesthesia Plan  ASA: III  Anesthesia Plan: General   Post-op Pain Management:    Induction: Intravenous  Airway Management Planned: Natural Airway and Nasal Cannula  Additional Equipment:   Intra-op Plan:   Post-operative Plan:   Informed Consent: I have reviewed the patients History and Physical, chart, labs and discussed the procedure including the risks, benefits and alternatives for the proposed anesthesia with the patient or authorized representative who has indicated his/her understanding and acceptance.   Dental advisory given  Plan Discussed with: Anesthesiologist, CRNA and Surgeon  Anesthesia Plan Comments:         Anesthesia Quick Evaluation

## 2016-02-24 NOTE — Discharge Instructions (Signed)
Electrical Cardioversion, Care After This sheet gives you information about how to care for yourself after your procedure. Your health care provider may also give you more specific instructions. If you have problems or questions, contact your health care provider. What can I expect after the procedure? After the procedure, it is common to have:  Some redness on the skin where the shocks were given. Follow these instructions at home:  Do not drive for 24 hours if you were given a medicine to help you relax (sedative).  Take over-the-counter and prescription medicines only as told by your health care provider.  Ask your health care provider how to check your pulse. Check it often.  Rest for 48 hours after the procedure or as told by your health care provider.  Avoid or limit your caffeine use as told by your health care provider. Contact a health care provider if:  You feel like your heart is beating too quickly or your pulse is not regular.  You have a serious muscle cramp that does not go away. Get help right away if:  You have discomfort in your chest.  You are dizzy or you feel faint.  You have trouble breathing or you are short of breath.  Your speech is slurred.  You have trouble moving an arm or leg on one side of your body.  Your fingers or toes turn cold or blue. This information is not intended to replace advice given to you by your health care provider. Make sure you discuss any questions you have with your health care provider. Document Released: 10/19/2012 Document Revised: 08/02/2015 Document Reviewed: 07/05/2015 Elsevier Interactive Patient Education  2017 [email or web address]  YOU HAD AN CARDIAC PROCEDURE TODAY: Refer to the procedure report and other information in the discharge instructions given to you for any specific questions about what was found during the examination. If this information does not answer your questions, please call [facility] office at  [phone] to clarify.   DIET: Your first meal following the procedure should be a light meal and then it is ok to progress to your normal diet. A half-sandwich or bowl of soup is an example of a good first meal. Heavy or fried foods are harder to digest and may make you feel nauseous or bloated. Drink plenty of fluids but you should avoid alcoholic beverages for 24 hours. If you had a esophageal dilation, please see attached instructions for diet.   ACTIVITY: Your care partner should take you home directly after the procedure. You should plan to take it easy, moving slowly for the rest of the day. You can resume normal activity the day after the procedure however YOU SHOULD NOT DRIVE, use power tools, machinery or perform tasks that involve climbing or major physical exertion for 24 hours (because of the sedation medicines used during the test).   SYMPTOMS TO REPORT IMMEDIATELY: A cardiologist can be reached at any hour. Please call [phone] for any of the following symptoms:  Vomiting of blood or coffee ground material  New, significant abdominal pain  New, significant chest pain or pain under the shoulder blades  Painful or persistently difficult swallowing  New shortness of breath  Black, tarry-looking or red, bloody stools  FOLLOW UP:  Please also call with any specific questions about appointments or follow up tests.

## 2016-02-24 NOTE — Anesthesia Postprocedure Evaluation (Signed)
Anesthesia Post Note  Patient: [name] [name]  Procedure(s) Performed: Procedure(s) (LRB): TRANSESOPHAGEAL ECHOCARDIOGRAM (TEE) (N/A) CARDIOVERSION (N/A)  Patient location during evaluation: PACU Anesthesia Type: General Level of consciousness: awake and alert and oriented Pain management: pain level controlled Vital Signs Assessment: post-procedure vital signs reviewed and stable Respiratory status: spontaneous breathing, nonlabored ventilation and respiratory function stable Cardiovascular status: blood pressure returned to baseline and stable Postop Assessment: no signs of nausea or vomiting Anesthetic complications: no       Last Vitals:  Vitals:   02/24/16 1000 02/24/16 1010  BP: (!) 86/40 (!) 84/44  Pulse: (!) 54 (!) 54  Resp: 15 16  Temp:      Last Pain:  Vitals:   02/24/16 0927  TempSrc: Oral                 Saphira Lahmann A.

## 2016-02-24 NOTE — H&P (View-Only) (Signed)
Patient Name: [name] [name] Date of Encounter: 02/20/2016  Primary Cardiologist: Dr. Lorretta Harp Problem List     Principal Problem:   Atrial fibrillation with rapid ventricular response [facility]) Active Problems:   Hyperlipidemia   Essential hypertension   Chronic cough   Gastroesophageal reflux disease     Subjective   Patient denies any SOB or CP   Inpatient Medications    Scheduled Meds: . amiodarone  200 mg Oral BID  . apixaban  5 mg Oral BID  . buPROPion  150 mg Oral Daily  . famotidine  40 mg Oral Daily  . metoprolol tartrate  12.5 mg Oral Q6H  . mometasone-formoterol  2 puff Inhalation BID  . pravastatin  40 mg Oral QHS   Continuous Infusions:  PRN Meds: acetaminophen, ondansetron (ZOFRAN) IV   Vital Signs    Vitals:   02/19/16 2011 02/19/16 2356 02/20/16 0527 02/20/16 0916  BP:  103/71 103/63 (!) 95/59  Pulse:  (!) 114 (!) 107 (!) 111  Resp:  18 18 18   Temp:  97.8 F (36.6 C) 97.9 F (36.6 C) 97.9 F (36.6 C)  TempSrc:  Oral Oral Oral  SpO2: 98% 100% 97% 94%  Weight:   221 lb 6.4 oz (100.4 kg)   Height:        Intake/Output Summary (Last 24 hours) at 02/20/16 0934 Last data filed at 02/20/16 0500  Gross per 24 hour  Intake           554.32 ml  Output             1300 ml  Net          -745.68 ml   Filed Weights   02/18/16 0607 02/19/16 0549 02/20/16 0527  Weight: 220 lb 9.6 oz (100.1 kg) 220 lb 3.2 oz (99.9 kg) 221 lb 6.4 oz (100.4 kg)    Physical Exam    GEN: Well nourished, well developed, in no acute distress.  HEENT: Grossly normal.  Neck: Supple, no JVD, carotid bruits, or masses. Cardiac: irregularly irregular and tachy, no murmurs, rubs, or gallops. No clubbing, cyanosis, edema.  Radials/DP/PT 2+ and equal bilaterally.  Respiratory:  Respirations regular and unlabored, clear to auscultation bilaterally. GI: Soft, nontender, nondistended, BS + x 4. MS: no deformity or atrophy. Skin: warm and dry, no rash. Neuro:  Strength and  sensation are intact. Psych: AAOx3.  Normal affect.  Labs    CBC  Recent Labs  02/17/16 1644  WBC 7.9  NEUTROABS 3.0  HGB 14.1  HCT 42.4  MCV 92.0  PLT 123456   Basic Metabolic Panel  Recent Labs  02/17/16 1644 02/18/16 0454  NA 141 141  K 3.3* 3.6  CL 103 104  CO2 27 26  GLUCOSE 92 115*  BUN 14 13  CREATININE 1.41* 1.25*  CALCIUM 9.3 9.2  MG 1.7  --    Liver Function Tests  Recent Labs  02/17/16 1644  AST 16  ALT 10*  ALKPHOS 52  BILITOT 0.5  PROT 6.7  ALBUMIN 3.9   No results for input(s): LIPASE, AMYLASE in the last 72 hours. Cardiac Enzymes No results for input(s): CKTOTAL, CKMB, CKMBINDEX, TROPONINI in the last 72 hours. BNP Invalid input(s): POCBNP D-Dimer No results for input(s): DDIMER in the last 72 hours. Hemoglobin A1C  Recent Labs  02/17/16 1644  HGBA1C 5.8*   Fasting Lipid Panel  Recent Labs  02/18/16 0454  CHOL 125  HDL 41  LDLCALC 66  TRIG 90  CHOLHDL 3.0   Thyroid Function Tests  Recent Labs  02/18/16 1310  TSH 1.852    Telemetry    Atrial fibrillation with RVR - Personally Reviewed  ECG    Atrial fibrillation with RVR - Personally Reviewed  Radiology    No results found.  Cardiac Studies   Study Conclusions  - Left ventricle: The cavity size was normal. Wall thickness was   normal. Systolic function was normal. The estimated ejection   fraction was in the range of 50% to 55%. Wall motion was normal;   there were no regional wall motion abnormalities. - Mitral valve: Calcified annulus. There was mild regurgitation. - Left atrium: The atrium was moderately dilated.   Anterior-posterior dimension: 48 mm. - Right ventricle: The cavity size was mildly dilated. Wall   thickness was normal. - Right atrium: The atrium was severely dilated. - Tricuspid valve: There was mild regurgitation. - Pulmonary arteries: Systolic pressure was mildly increased. PA   peak pressure: 37 mm Hg (S).   Patient Profile       [phone] WF with a history of chronic kidney diseasestage III, hypertension, hyperlipidemia, sleep apnea, and borderline diabetes who presented to Dr. Deterding's office for routine follow-up of CKD this morning and was noted to be tachycardia with an irregularly irregular rhythm.Patient reports that she has had a chronic cough for the last 2 months. She was evaluated by her PCP in early January, at which time the patient reports her heart rate was noted to be normal. However, just a week or so later, Ms. Clagett states her heart rate was around 140 when being evaluated by orthopedics for knee pain. Since that time, she has monitored her heart rate at home, noting that it is occasionally in the 70s to 80s but also much higher at times. She does not feel any palpitationsand also denies a history of arrhythmias. She has intermittent vague chest tightness lasting only a brief moment. This happens about twice a month, having last been present week ago. She was admitted to [facility] for further control of her afib with RVR.  Assessment & Plan    1.  Atrial fibrillation with rapid ventricular response: exact duration is uncertain, though most likely has been present for at least 3-4 weeks as son reports of heart rate near 140 noted during orthopedics visit in early to mid-January. Patient's only symptoms are of orthostatic lightheadedness and fatigue.  Chest x-ray on 01/17/16 demonstrated mild cardiomegaly and due to concern for possible tachycardia induced CM. She was admitted for rate control and further workup of her atrial fibrillation.  Continue apixaban 5 mg twice a day for anticoagulation.  Appreciate care management consult. It appears Eliquis and Xarelto are both Tier 3 and covered by insurance with copay of $45 per month. SHe will stay on Eliquis  Rate fairly well controlled on BB therapy and PO Amio with HR in the 80's -100's.    LFTs and TSH are normal.  Baseline PFTs with DLCO for Amio are  pending.  Follow daily EKG for QTc - pending this am  Check BMET today   2D echo showed normal LVF EF 50-55% wit mild MR and moderate LAE with an LA size of 1mm, mildly dilated RV and severe RAE with mild TR and mild pulmonary HTN.  I reviewed echo personally and EF around 50% with moderately enlarged RA/RV with mild RV dysfunction.     Would prefer to avoid TEE unless absolutely necessary due to  history of significant GERD and Nissen fundoplication although it was years ago but HR remains elevated with mild LV dysfunction and would prefer not to have her rate high for 3 weeks while waiting on adequate anticoagulation.  The schedule is full in endoscopy today and tomorrow so will see if we can get her on Monday as an outpt and discharge home today. I will discuss with GI to make sure they are ok with doing TEE.    2.  Essential hypertension:  Blood pressure remains soft today.  Holding amlodipine and valsartan-HCTZ   3.  Hyperlipidemia: Patient on pravastatin at home.  Hemoglobin A1c 5.8 and LDL 66.  4.  GERD:  Continue famotidine.  5.  Chronic cough: Likely multifactorial but could also be due to atrial fibrillation over the last few months, in addition to GERD and underlying lung disease.  Continue home bronchodilator and allergy regimen (with the exception of chlorpheniramine, which has potential to precipitate arrhythmias)  Continue with GERD therapy.  Signed, Fransico Him, MD  02/20/2016, 9:34 AM

## 2016-02-25 ENCOUNTER — Telehealth: Payer: Self-pay | Admitting: Internal Medicine

## 2016-02-25 NOTE — Telephone Encounter (Signed)
Patient made aware of Dr. Darnelle Bos recommendations. Patient verbalized understanding.

## 2016-02-25 NOTE — Telephone Encounter (Signed)
That sounds like a reasonable plan. If she continues to feel weak with low heart rate, she could try cutting her metoprolol in half until she sees Ermalinda Barrios later this week.  Nelva Bush, MD [facility] Pager: [phone]

## 2016-02-25 NOTE — Telephone Encounter (Signed)
New Message     Pt states she had heart shocked yesterday, her pulse is really low today, she has severe fatigue and she feel like her legs are gonna give out

## 2016-02-25 NOTE — Telephone Encounter (Signed)
Patient called complaining of low HR, fatigue, and lower extremity weakness. The patient had a TEE/DCCV for Afib RVR and was discharged from the hospital yesterday. The patient states that when she was discharged her HR was 54 and BP 84/44. She states that her HR has been 53-58 and her BP this morning was 107/58. The patient takes metoprolol 25 mg BID. The patient states that she is extremely weak and wants to know if she needs any medication changes. Spoke with DOD Dr. Acie Fredrickson and his recommendation was for the patient to make sure she stays hydrated and to make sure that she is eating. He states that the patient may still be experiencing the side effects from anesthesia. He recommends the patient have an appointment scheduled with Dr. Saunders Revel this week. Dr. Darnelle Bos schedule is full, but appointment was made with Ermalinda Barrios, PA on 2/15 at 9:15 am while Dr. Saunders Revel is in the office. Patient made aware of Dr. Elmarie Shiley recommendations and of her appointment. Patient verbalized understanding and is in agreement with this plan.

## 2016-02-26 ENCOUNTER — Encounter (HOSPITAL_COMMUNITY): Payer: Self-pay | Admitting: Internal Medicine

## 2016-02-27 ENCOUNTER — Encounter: Payer: Self-pay | Admitting: Physician Assistant

## 2016-02-27 ENCOUNTER — Ambulatory Visit (INDEPENDENT_AMBULATORY_CARE_PROVIDER_SITE_OTHER): Payer: Medicare Other | Admitting: Physician Assistant

## 2016-02-27 VITALS — BP 116/68 | HR 55 | Ht 67.0 in | Wt 232.1 lb

## 2016-02-27 DIAGNOSIS — I480 Paroxysmal atrial fibrillation: Secondary | ICD-10-CM | POA: Diagnosis not present

## 2016-02-27 DIAGNOSIS — I10 Essential (primary) hypertension: Secondary | ICD-10-CM

## 2016-02-27 DIAGNOSIS — R001 Bradycardia, unspecified: Secondary | ICD-10-CM

## 2016-02-27 DIAGNOSIS — I340 Nonrheumatic mitral (valve) insufficiency: Secondary | ICD-10-CM

## 2016-02-27 HISTORY — DX: Nonrheumatic mitral (valve) insufficiency: I34.0

## 2016-02-27 NOTE — Progress Notes (Signed)
Cardiology Office Note    Date:  02/27/2016   ID:  [name] [name], DOB Dec 06, 1945, MRN [id number]  PCP:  Vidal Schwalbe, MD  Cardiologist: Dr.End  No chief complaint on file.   History of Present Illness:  [name] [name] is a 71 y.o. female with a history of chronic kidney disease stage III, hypertension, hyperlipidemia, sleep apnea, and borderline diabetes who presented to Dr. Deterding's office for routine follow-up of CKD on 02/17/16 and was noted to be tachycardia with an irregularly irregular rhythm. She was seen by Dr. Saunders Revel in the office and admitted to [facility] With rapid atrial fibrillation.CHADSVASC=3 started on Eliquis 5 mg BID, IV Amiodarone transitioned to oral. She underwent TEE guided cardioversion on 02/24/16 converting to normal sinus rhythm  Since then she complains of extreme fatigue and heart rates down in the 50s. She was told she can decrease her metoprolol to 12.5 twice a day but she only did that today for the first time. She still complaining of her chronic cough that she was placed on steroids twice. Also complaining of intermittent abdominal pain and increased belching. She is taking her medicines with food.       Past Medical History:  Diagnosis Date  . Anxiety   . Arthritis   . Cataracts, bilateral    immature  . CKD (chronic kidney disease) stage 3, GFR 30-59 ml/min   . Depression   . GERD (gastroesophageal reflux disease)    was on Nexium   . GERD (gastroesophageal reflux disease)   . Hemorrhoid   . History of bronchitis    couple of yrs ago  . History of colon polyps   . Hyperlipidemia    takes Pravastatin daily  . Hypertension    takes Amlodipine and Diovan daily  . Hypertension   . Sleep apnea    no cpap used  . Sleep apnea    study done 20+yrs ago and doesn't use cpap  . Urinary urgency   . Weakness    in left leg r/t back    Past Surgical History:  Procedure Laterality Date  . ABDOMINAL HYSTERECTOMY  1980   partial  . ABDOMINAL  HYSTERECTOMY     partial  . BACK SURGERY    . CARDIOVERSION N/A 02/24/2016   Procedure: CARDIOVERSION;  Surgeon: Fay Records, MD;  Location: [place];  Service: Cardiovascular;  Laterality: N/A;  . CARPAL TUNNEL RELEASE    . CHOLECYSTECTOMY  1975  . CHOLECYSTECTOMY    . COLONOSCOPY    . COLONOSCOPY WITH PROPOFOL  12/29/2011   Procedure: COLONOSCOPY WITH PROPOFOL;  Surgeon: Lear Ng, MD;  Location: [facility];  Service: Endoscopy;  Laterality: N/A;  . COLONOSCOPY WITH PROPOFOL N/A 05/24/2014   Procedure: COLONOSCOPY WITH PROPOFOL;  Surgeon: Wilford Corner, MD;  Location: [facility];  Service: Endoscopy;  Laterality: N/A;  . ESOPHAGOGASTRODUODENOSCOPY    . FOOT SURGERY     left, right  . FRACTURE SURGERY     left leg-knee  . heel spurs removed Bilateral   . HOT HEMOSTASIS  12/29/2011   Procedure: HOT HEMOSTASIS (ARGON PLASMA COAGULATION/BICAP);  Surgeon: Lear Ng, MD;  Location: [facility];  Service: Endoscopy;  Laterality: N/A;  . HOT HEMOSTASIS N/A 05/24/2014   Procedure: HOT HEMOSTASIS (ARGON PLASMA COAGULATION/BICAP);  Surgeon: Wilford Corner, MD;  Location: [facility];  Service: Endoscopy;  Laterality: N/A;  . leg surgery d/t break Left   . LUMBAR LAMINECTOMY/DECOMPRESSION MICRODISCECTOMY Left 04/05/2013   Procedure: LUMBAR  FOUR TO FIVE LUMBAR LAMINECTOMY/DECOMPRESSION MICRODISCECTOMY 1 LEVEL;  Surgeon: Eustace Moore, MD;  Location: [place] [facility];  Service: Neurosurgery;  Laterality: Left;  . NISSEN FUNDOPLICATION    . TEE WITHOUT CARDIOVERSION N/A 02/24/2016   Procedure: TRANSESOPHAGEAL ECHOCARDIOGRAM (TEE);  Surgeon: Fay Records, MD;  Location: [facility];  Service: Cardiovascular;  Laterality: N/A;    Current Medications: Outpatient Medications Prior to Visit  Medication Sig Dispense Refill  . acetaminophen (TYLENOL) 500 MG tablet Take 500 mg by mouth every 6 (six) hours as needed. For pain    . amiodarone (PACERONE) 200 MG tablet Take 1  tablet (200 mg total) by mouth 2 (two) times daily. 60 tablet 1  . apixaban (ELIQUIS) 5 MG TABS tablet Take 1 tablet (5 mg total) by mouth 2 (two) times daily. 60 tablet 6  . Ascorbic Acid (VITAMIN C WITH ROSE HIPS) 1000 MG tablet Take 1,000 mg by mouth daily.    . budesonide-formoterol (SYMBICORT) 160-4.5 MCG/ACT inhaler Inhale 2 puffs into the lungs 2 (two) times daily. 1 Inhaler 6  . buPROPion (WELLBUTRIN XL) 150 MG 24 hr tablet Take 150 mg by mouth every morning.    Marland Kitchen CALCIUM PO Take 1 tablet by mouth every morning.    . Cholecalciferol (VITAMIN D) 2000 UNITS tablet Take 4,000 Units by mouth daily.    . famotidine (PEPCID) 40 MG tablet Take 1 tablet (40 mg total) by mouth daily. (Patient taking differently: Take 40 mg by mouth 2 (two) times daily. ) 90 tablet 1  . pravastatin (PRAVACHOL) 40 MG tablet Take 40 mg by mouth at bedtime.    . metoprolol tartrate (LOPRESSOR) 25 MG tablet Take 1 tablet (25 mg total) by mouth 2 (two) times daily. 60 tablet 6  . fluticasone (FLONASE) 50 MCG/ACT nasal spray Place 2 sprays into both nostrils daily. (Patient not taking: Reported on 02/17/2016) 16 g 5   No facility-administered medications prior to visit.      Allergies:   Lexapro [escitalopram]; Peroxide [hydrogen peroxide]; Zoloft [sertraline hcl]; and Neosporin [neomycin-bacitracin zn-polymyx]   Social History   Social History  . Marital status: Married    Spouse name: N/A  . Number of children: N/A  . Years of education: N/A   Social History Main Topics  . Smoking status: Never Smoker  . Smokeless tobacco: Never Used  . Alcohol use No  . Drug use: No  . Sexual activity: Yes    Birth control/ protection: Surgical   Other Topics Concern  . None   Social History Narrative   ** Merged History Encounter **         Family History:  The patient's   family history includes Colon cancer in her mother; Heart disease in her father.   ROS:   Please see the history of present illness.      Review of Systems  Constitution: Positive for weakness, malaise/fatigue and weight gain.  HENT: Negative.   Eyes: Negative.   Cardiovascular: Negative.   Respiratory: Positive for cough and wheezing.   Hematologic/Lymphatic: Negative.   Musculoskeletal: Negative.  Negative for joint pain.  Gastrointestinal: Positive for abdominal pain.  Genitourinary: Negative.   Neurological: Positive for dizziness.   All other systems reviewed and are negative.   PHYSICAL EXAM:   VS:  BP 116/68   Pulse (!) 55   Ht 5\' 7"  (1.702 m)   Wt 232 lb 1.9 oz (105.3 kg)   BMI 36.36 kg/m   Physical Exam  GEN:  Well nourished, well developed, in no acute distress  Neck: no JVD, carotid bruits, or masses Cardiac:RRR; 1/6 systolic murmur at the left sternal border Respiratory:  Crease breath sounds but clear to auscultation bilaterally, normal work of breathing GI: soft, nontender, nondistended, + BS Ext: without cyanosis, clubbing, or edema, Good distal pulses bilaterally Psych: euthymic mood, full affect  Wt Readings from Last 3 Encounters:  02/27/16 232 lb 1.9 oz (105.3 kg)  02/24/16 221 lb (100.2 kg)  02/20/16 221 lb 6.4 oz (100.4 kg)      Studies/Labs Reviewed:   EKG:  EKG is ordered today.  The ekg ordered today demonstrates sinus bradycardia at 55 bpm  Recent Labs: 02/17/2016: ALT 10; B Natriuretic Peptide 150.2; Hemoglobin 14.1; Magnesium 1.7; Platelets 215 02/18/2016: TSH 1.852 02/20/2016: BUN 17; Creatinine, Ser 1.35; Potassium 3.9; Sodium 141   Lipid Panel    Component Value Date/Time   CHOL 125 02/18/2016 0454   TRIG 90 02/18/2016 0454   HDL 41 02/18/2016 0454   CHOLHDL 3.0 02/18/2016 0454   VLDL 18 02/18/2016 0454   LDLCALC 66 02/18/2016 0454    Additional studies/ records that were reviewed today include:  TEE 02/24/16  TEE   Hide copied text Hover for attribution information LA, LA appendage without masses LA large Suspicious for small PFO by color doppler TV is normal   Mod TR MV is mildly thickend  MR is at least moderate AV is mildly thickened.  Trivial AI PV is normal  No PI LVEF is mildly depressed Mild fixed plaquing of the thoraicic aorta.       2Decho 02/18/16 Study Conclusions   - Left ventricle: The cavity size was normal. Wall thickness was   normal. Systolic function was normal. The estimated ejection   fraction was in the range of 50% to 55%. Wall motion was normal;   there were no regional wall motion abnormalities. - Mitral valve: Calcified annulus. There was mild regurgitation. - Left atrium: The atrium was moderately dilated.   Anterior-posterior dimension: 48 mm. - Right ventricle: The cavity size was mildly dilated. Wall   thickness was normal. - Right atrium: The atrium was severely dilated. - Tricuspid valve: There was mild regurgitation. - Pulmonary arteries: Systolic pressure was mildly increased. PA   peak pressure: 37 mm Hg (S).      ASSESSMENT:    1. Bradycardia   2. Paroxysmal atrial fibrillation (HCC)   3. Essential hypertension   4. Non-rheumatic mitral regurgitation      PLAN:  In order of problems listed above:  Bradycardia patient feels terrible since her cardioversion. We will stop metoprolol. Continue amiodarone 200 mg twice a day for another week. Follow-up with Dr.End next week. She is to call if her heart rate starts to go fast or if it remains low.  PAF underwent cardioversion to normal sinus rhythm and maintaining this on amiodarone and Eliquis. Having some abdominal discomfort. I told her to increase her Pepcid to 40 mg twice a day to see if this helps to make sure it she takes her medicines after she eats.  Essential hypertension controlled   Mitral regurgitation mild on echo moderate on TEE she did have elevated PA pressures and has had a chronic cough and weight gain. Did not start diuretic today but consider if she still having trouble next week. No edema or Rales on exam.       Medication  Adjustments/Labs and Tests Ordered: Current medicines are reviewed at length with the  patient today.  Concerns regarding medicines are outlined above.  Medication changes, Labs and Tests ordered today are listed in the Patient Instructions below. Patient Instructions  Your physician has recommended you make the following change in your medication:  INCREASE  PEPCID  20 MG  TO  TWICE  DAILY  STOP METOPROLOL Your physician recommends that you schedule a follow-up appointment in: AS SCHEDULED      Signed, Ermalinda Barrios, PA-C  02/27/2016 9:56 AM    [address], [place], [place]  [address] Phone: [phone]; Fax: [phone]

## 2016-02-27 NOTE — Patient Instructions (Signed)
Your physician has recommended you make the following change in your medication:  INCREASE  PEPCID  20 MG  TO  TWICE  DAILY  STOP METOPROLOL Your physician recommends that you schedule a follow-up appointment in: AS SCHEDULED

## 2016-03-02 ENCOUNTER — Telehealth: Payer: Self-pay | Admitting: *Deleted

## 2016-03-02 ENCOUNTER — Ambulatory Visit (HOSPITAL_COMMUNITY)
Admission: RE | Admit: 2016-03-02 | Discharge: 2016-03-02 | Disposition: A | Discharge status: Home / Self Care | Payer: Medicare Other | Source: Ambulatory Visit | Attending: Nurse Practitioner | Admitting: Nurse Practitioner

## 2016-03-02 ENCOUNTER — Encounter (HOSPITAL_COMMUNITY): Payer: Self-pay | Admitting: Nurse Practitioner

## 2016-03-02 ENCOUNTER — Telehealth: Payer: Self-pay | Admitting: Internal Medicine

## 2016-03-02 VITALS — BP 128/78 | HR 106 | Ht 67.0 in | Wt 231.2 lb

## 2016-03-02 DIAGNOSIS — F329 Major depressive disorder, single episode, unspecified: Secondary | ICD-10-CM | POA: Insufficient documentation

## 2016-03-02 DIAGNOSIS — G4733 Obstructive sleep apnea (adult) (pediatric): Secondary | ICD-10-CM | POA: Insufficient documentation

## 2016-03-02 DIAGNOSIS — F419 Anxiety disorder, unspecified: Secondary | ICD-10-CM | POA: Insufficient documentation

## 2016-03-02 DIAGNOSIS — N183 Chronic kidney disease, stage 3 (moderate): Secondary | ICD-10-CM | POA: Insufficient documentation

## 2016-03-02 DIAGNOSIS — I481 Persistent atrial fibrillation: Secondary | ICD-10-CM

## 2016-03-02 DIAGNOSIS — K219 Gastro-esophageal reflux disease without esophagitis: Secondary | ICD-10-CM | POA: Diagnosis not present

## 2016-03-02 DIAGNOSIS — Z86010 Personal history of colonic polyps: Secondary | ICD-10-CM | POA: Diagnosis not present

## 2016-03-02 DIAGNOSIS — E785 Hyperlipidemia, unspecified: Secondary | ICD-10-CM | POA: Insufficient documentation

## 2016-03-02 DIAGNOSIS — I4819 Other persistent atrial fibrillation: Secondary | ICD-10-CM

## 2016-03-02 DIAGNOSIS — I129 Hypertensive chronic kidney disease with stage 1 through stage 4 chronic kidney disease, or unspecified chronic kidney disease: Secondary | ICD-10-CM | POA: Insufficient documentation

## 2016-03-02 DIAGNOSIS — I4891 Unspecified atrial fibrillation: Secondary | ICD-10-CM

## 2016-03-02 MED ORDER — FUROSEMIDE 20 MG PO TABS: 20.0000 mg | ORAL_TABLET | Freq: Every day | ORAL | 2 refills | Status: DC

## 2016-03-02 NOTE — Telephone Encounter (Signed)
Pt aware she has  been scheduled to see Roderic Palau, NP in the [facility] today at 11:30 AM.

## 2016-03-02 NOTE — Patient Instructions (Signed)
Your physician has recommended you make the following change in your medication:  1)Start lasix 20mg  a day

## 2016-03-02 NOTE — Telephone Encounter (Signed)
Pt states metoprolol tartrate 25mg  bid was stopped at time of office visit with Ermalinda Barrios, PA 02/27/16 because of bradycardia and pt did not feel good. Pt states around 4 AM Saturday BP 128/81 HR 72, later in the morning HR 115, did not record BP. Pt restarted metoprolol tartrate 12.5 mg bid. At 3 PM Saturday BP 151/91, HR 105. Pt states yesterday 113/85 HR 108, 6:30PM 126/84 HR 100, 8:45PM 121/80 HR 105 Pt states BP this morning 139/96 HR 115-before metoprolol, after metoprolol HR 99-120.

## 2016-03-02 NOTE — Telephone Encounter (Signed)
New Message  Pt c/o BP issue: STAT if pt c/o blurred vision, one-sided weakness or slurred speech  1. What are your last 5 BP readings? 139/96, hr 107-120's  2. Are you having any other symptoms (ex. Dizziness, headache, blurred vision, passed out)? Headaches due to possible sinus infection  3. What is your BP issue? Pt voiced she was informed to stop taking metoprolol but this medication is not listed on pts medication.  Pt voiced PA informed pt to stop taking this medication and if HR increases to take half.  Please f/u with medication.

## 2016-03-02 NOTE — Telephone Encounter (Signed)
Pt states she is asymptomatic at this time, does not feel palpitations or tachycardia.

## 2016-03-02 NOTE — Telephone Encounter (Signed)
-----   Message from Juluis Mire, RN sent at 03/02/2016  1:21 PM EST ----- Regarding: sleep study Pt needs sleep study for afib. Pt will be expecting call. ESS was faxed to you. Thanks [facility]

## 2016-03-03 NOTE — Progress Notes (Signed)
Primary Care Physician: Vidal Schwalbe, MD Referring Physician: Dr.    [name] is a 71 y.o. female with a h/o chronic kidney diseasestage III, hypertension, hyperlipidemia, sleep apnea, and borderline diabetes who presented to Dr. Deterding's office for routine follow-up of CKD on 02/17/16 and was noted to be tachycardia with an irregularly irregular rhythm.She was seen by Dr. Saunders Revel in the office and admitted to [facility] With rapid atrial fibrillation.CHADSVASC=3 started on Eliquis 5 mg BID, IV Amiodarone transitioned to oral. She underwent TEE guided cardioversion on 02/24/16 converting to normal sinus rhythm   She complained of extreme fatigue and heart rates down in the 50s and metoprolol was stopped 2/15. She returned to afib 2/17 at which time metoprolol was restarted at 12.5 mg bid and referred to the afib clinic.  She does not drink alcohol or consume large amounts of caffeine. She was diagnosed with OSA years ago, used CPAP for a while, but later stopped using the machine. She has been told that she needs a new sleep study but has not been ordered.  She states that her weight is up 10 lbs since she left the hospital. She feels full in the abdomen but no pedal edema, PND/orthopnea. She continues to have a cough. She had been dieting right before she went into the hospital and has lost a few lbs.  Today, she denies symptoms of palpitations, chest pain, shortness of breath, orthopnea, PND, lower extremity edema, dizziness, presyncope, syncope, or neurologic sequela. Positive for weight gain. The patient is tolerating medications without difficulties and is otherwise without complaint today.   Past Medical History:  Diagnosis Date  . Anxiety   . Arthritis   . Cataracts, bilateral    immature  . CKD (chronic kidney disease) stage 3, GFR 30-59 ml/min   . Depression   . GERD (gastroesophageal reflux disease)    was on Nexium   . GERD (gastroesophageal reflux disease)   . Hemorrhoid   .  History of bronchitis    couple of yrs ago  . History of colon polyps   . Hyperlipidemia    takes Pravastatin daily  . Hypertension    takes Amlodipine and Diovan daily  . Hypertension   . Sleep apnea    no cpap used  . Sleep apnea    study done 20+yrs ago and doesn't use cpap  . Urinary urgency   . Weakness    in left leg r/t back   Past Surgical History:  Procedure Laterality Date  . ABDOMINAL HYSTERECTOMY  1980   partial  . ABDOMINAL HYSTERECTOMY     partial  . BACK SURGERY    . CARDIOVERSION N/A 02/24/2016   Procedure: CARDIOVERSION;  Surgeon: Fay Records, MD;  Location: [facility];  Service: Cardiovascular;  Laterality: N/A;  . CARPAL TUNNEL RELEASE    . CHOLECYSTECTOMY  1975  . CHOLECYSTECTOMY    . COLONOSCOPY    . COLONOSCOPY WITH PROPOFOL  12/29/2011   Procedure: COLONOSCOPY WITH PROPOFOL;  Surgeon: Lear Ng, MD;  Location: [facility];  Service: Endoscopy;  Laterality: N/A;  . COLONOSCOPY WITH PROPOFOL N/A 05/24/2014   Procedure: COLONOSCOPY WITH PROPOFOL;  Surgeon: Wilford Corner, MD;  Location: [facility];  Service: Endoscopy;  Laterality: N/A;  . ESOPHAGOGASTRODUODENOSCOPY    . FOOT SURGERY     left, right  . FRACTURE SURGERY     left leg-knee  . heel spurs removed Bilateral   . HOT HEMOSTASIS  12/29/2011   Procedure:  HOT HEMOSTASIS (ARGON PLASMA COAGULATION/BICAP);  Surgeon: Lear Ng, MD;  Location: [facility];  Service: Endoscopy;  Laterality: N/A;  . HOT HEMOSTASIS N/A 05/24/2014   Procedure: HOT HEMOSTASIS (ARGON PLASMA COAGULATION/BICAP);  Surgeon: Wilford Corner, MD;  Location: [facility];  Service: Endoscopy;  Laterality: N/A;  . leg surgery d/t break Left   . LUMBAR LAMINECTOMY/DECOMPRESSION MICRODISCECTOMY Left 04/05/2013   Procedure: LUMBAR FOUR TO FIVE LUMBAR LAMINECTOMY/DECOMPRESSION MICRODISCECTOMY 1 LEVEL;  Surgeon: Eustace Moore, MD;  Location: [facility];  Service: Neurosurgery;  Laterality: Left;  . NISSEN  FUNDOPLICATION    . TEE WITHOUT CARDIOVERSION N/A 02/24/2016   Procedure: TRANSESOPHAGEAL ECHOCARDIOGRAM (TEE);  Surgeon: Fay Records, MD;  Location: [facility] ENDOSCOPY;  Service: Cardiovascular;  Laterality: N/A;    Current Outpatient Prescriptions  Medication Sig Dispense Refill  . acetaminophen (TYLENOL) 500 MG tablet Take 500 mg by mouth every 6 (six) hours as needed. For pain    . amiodarone (PACERONE) 200 MG tablet Take 1 tablet (200 mg total) by mouth 2 (two) times daily. 60 tablet 1  . apixaban (ELIQUIS) 5 MG TABS tablet Take 1 tablet (5 mg total) by mouth 2 (two) times daily. 60 tablet 6  . Ascorbic Acid (VITAMIN C WITH ROSE HIPS) 1000 MG tablet Take 1,000 mg by mouth daily.    . budesonide-formoterol (SYMBICORT) 160-4.5 MCG/ACT inhaler Inhale 2 puffs into the lungs 2 (two) times daily. 1 Inhaler 6  . buPROPion (WELLBUTRIN XL) 150 MG 24 hr tablet Take 150 mg by mouth every morning.    Marland Kitchen CALCIUM PO Take 1 tablet by mouth every morning.    . Cholecalciferol (VITAMIN D) 2000 UNITS tablet Take 4,000 Units by mouth daily.    . famotidine (PEPCID) 40 MG tablet Take 1 tablet (40 mg total) by mouth daily. (Patient taking differently: Take 40 mg by mouth 2 (two) times daily. ) 90 tablet 1  . metoprolol tartrate (LOPRESSOR) 25 MG tablet 0.5 tablets 2 (two) times daily.    . pravastatin (PRAVACHOL) 40 MG tablet Take 40 mg by mouth at bedtime.    . furosemide (LASIX) 20 MG tablet Take 1 tablet (20 mg total) by mouth daily. 30 tablet 2   No current facility-administered medications for this encounter.     Allergies  Allergen Reactions  . Lexapro [Escitalopram]     Makes her feel funny  . Peroxide [Hydrogen Peroxide] Other (See Comments)    Redness.   [name] [name] [Sertraline Hcl]     Makes her feel funny  . Neosporin [Neomycin-Bacitracin Zn-Polymyx] Rash    Blisters, itching.     Social History   Social History  . Marital status: Married    Spouse name: N/A  . Number of children: N/A  .  Years of education: N/A   Occupational History  . Not on file.   Social History Main Topics  . Smoking status: Never Smoker  . Smokeless tobacco: Never Used  . Alcohol use No  . Drug use: No  . Sexual activity: Yes    Birth control/ protection: Surgical   Other Topics Concern  . Not on file   Social History Narrative   ** Merged History Encounter **        Family History  Problem Relation Age of Onset  . Colon cancer Mother   . Heart disease Father     ROS- All systems are reviewed and negative except as per the HPI above  Physical Exam: Vitals:   03/02/16  1116  BP: 128/78  Pulse: (!) 106  Weight: 231 lb 3.2 oz (104.9 kg)  Height: 5\' 7"  (1.702 m)   Wt Readings from Last 3 Encounters:  03/02/16 231 lb 3.2 oz (104.9 kg)  02/27/16 232 lb 1.9 oz (105.3 kg)  02/24/16 221 lb (100.2 kg)    Labs: Lab Results  Component Value Date   NA 141 02/20/2016   K 3.9 02/20/2016   CL 102 02/20/2016   CO2 30 02/20/2016   GLUCOSE 131 (H) 02/20/2016   BUN 17 02/20/2016   CREATININE 1.35 (H) 02/20/2016   CALCIUM 9.6 02/20/2016   MG 1.7 02/17/2016   Lab Results  Component Value Date   INR 0.99 02/17/2016   Lab Results  Component Value Date   CHOL 125 02/18/2016   HDL 41 02/18/2016   LDLCALC 66 02/18/2016   TRIG 90 02/18/2016     GEN- The patient is well appearing, alert and oriented x 3 today.   Head- normocephalic, atraumatic Eyes-  Sclera clear, conjunctiva pink Ears- hearing intact Oropharynx- clear Neck- supple, no JVP Lymph- no cervical lymphadenopathy Lungs- Clear to ausculation bilaterally, normal work of breathing Heart- irregular rate and rhythm, no murmurs, rubs or gallops, PMI not laterally displaced GI- soft, NT, ND, + BS Extremities- no clubbing, cyanosis, or edema MS- no significant deformity or atrophy Skin- no rash or lesion Psych- euthymic mood, full affect Neuro- strength and sensation are intact  EKG-afib with RVR at 106 bpm, qrs int 72  ms, qtc 435 ms Epic records  TEE-Left ventricle:  LVEF is depressed Difficult to accurately assess LVEF as LV is foreshortened.  ------------------------------------------------------------------- Aortic valve:  AV is mildly thickened. Trace AI.  ------------------------------------------------------------------- Aorta:  Minimal fixed plaque in the thoracic aorta.  ------------------------------------------------------------------- Mitral valve:  MV is normal Moderate MR  ------------------------------------------------------------------- Left atrium:  LA is large.  No evidence of thrombus in the atrial cavity or appendage.  ------------------------------------------------------------------- Atrial septum:  Suspicious for small PFO by color doppler.  ------------------------------------------------------------------- Right ventricle:  RV size is normal RVEF is depressed.  ------------------------------------------------------------------- Pulmonic valve:   PV is normal.  ------------------------------------------------------------------- Tricuspid valve:  TV is normal Mild TR>  ------------------------------------------------------------------- Pericardium:  There was no pericardial effusion.   ------------------------------------------------------------------- Post procedure conclusions Ascending Aorta:  - Minimal fixed plaque in the thoracic aorta.  ------------------------------------------------------------------- Prepared and Electronically Authenticated by  [name], M.D. 2018-02-12T14:38:06    Assessment and Plan: 1. Persistent afib Returned to [facility] with amiodarone and cardioversion but now has reverted back to afib after BB was stopped. She has resumed metoprolol 12.5 mg bid and may need to load on amiodarone a while longer, may need repeat cardioversion after longer loading period. Continues on 200 mg bid for now. Sleep study requested for h/o  of untreated OSA Continue apixaban for a CHA2DS2VASc score of at least 4  2. Weight gain of 10 lbs since hospitalization  Start 20 mg daily Avoid salt    F/u afib clinic x one week   Butch Penny C. Carroll, [facility] [address] [place], [place] [address] [phone]

## 2016-03-05 ENCOUNTER — Ambulatory Visit: Payer: Medicare Other | Admitting: Internal Medicine

## 2016-03-09 ENCOUNTER — Encounter (HOSPITAL_COMMUNITY): Payer: Self-pay | Admitting: Nurse Practitioner

## 2016-03-09 ENCOUNTER — Ambulatory Visit (HOSPITAL_COMMUNITY)
Admission: RE | Admit: 2016-03-09 | Discharge: 2016-03-09 | Disposition: A | Discharge status: Home / Self Care | Payer: Medicare Other | Source: Ambulatory Visit | Attending: Nurse Practitioner | Admitting: Nurse Practitioner

## 2016-03-09 ENCOUNTER — Other Ambulatory Visit (HOSPITAL_COMMUNITY): Payer: Self-pay | Admitting: *Deleted

## 2016-03-09 VITALS — BP 144/86 | HR 97 | Ht 67.0 in | Wt 225.0 lb

## 2016-03-09 DIAGNOSIS — N183 Chronic kidney disease, stage 3 (moderate): Secondary | ICD-10-CM | POA: Insufficient documentation

## 2016-03-09 DIAGNOSIS — Z7901 Long term (current) use of anticoagulants: Secondary | ICD-10-CM | POA: Diagnosis not present

## 2016-03-09 DIAGNOSIS — E785 Hyperlipidemia, unspecified: Secondary | ICD-10-CM | POA: Insufficient documentation

## 2016-03-09 DIAGNOSIS — I481 Persistent atrial fibrillation: Secondary | ICD-10-CM | POA: Diagnosis not present

## 2016-03-09 DIAGNOSIS — K219 Gastro-esophageal reflux disease without esophagitis: Secondary | ICD-10-CM | POA: Diagnosis not present

## 2016-03-09 DIAGNOSIS — Z888 Allergy status to other drugs, medicaments and biological substances status: Secondary | ICD-10-CM | POA: Diagnosis not present

## 2016-03-09 DIAGNOSIS — Z79899 Other long term (current) drug therapy: Secondary | ICD-10-CM | POA: Insufficient documentation

## 2016-03-09 DIAGNOSIS — F329 Major depressive disorder, single episode, unspecified: Secondary | ICD-10-CM | POA: Insufficient documentation

## 2016-03-09 DIAGNOSIS — I4819 Other persistent atrial fibrillation: Secondary | ICD-10-CM

## 2016-03-09 DIAGNOSIS — I129 Hypertensive chronic kidney disease with stage 1 through stage 4 chronic kidney disease, or unspecified chronic kidney disease: Secondary | ICD-10-CM | POA: Insufficient documentation

## 2016-03-09 DIAGNOSIS — G4733 Obstructive sleep apnea (adult) (pediatric): Secondary | ICD-10-CM | POA: Diagnosis not present

## 2016-03-09 DIAGNOSIS — F419 Anxiety disorder, unspecified: Secondary | ICD-10-CM | POA: Insufficient documentation

## 2016-03-09 LAB — BASIC METABOLIC PANEL
ANION GAP: 8 (ref 5–15)
BUN: 15 mg/dL (ref 6–20)
CO2: 27 mmol/L (ref 22–32)
Calcium: 9.3 mg/dL (ref 8.9–10.3)
Chloride: 105 mmol/L (ref 101–111)
Creatinine, Ser: 1.3 mg/dL — ABNORMAL HIGH (ref 0.44–1.00)
GFR calc Af Amer: 47 mL/min — ABNORMAL LOW (ref >60)
GFR, EST NON AFRICAN AMERICAN: 41 mL/min — AB (ref >60)
GLUCOSE: 163 mg/dL — AB (ref 65–99)
POTASSIUM: 3.4 mmol/L — AB (ref 3.5–5.1)
Sodium: 140 mmol/L (ref 135–145)

## 2016-03-09 LAB — CBC
HCT: 45.4 % (ref 36.0–46.0)
Hemoglobin: 14.8 g/dL (ref 12.0–15.0)
MCH: 30.6 pg (ref 26.0–34.0)
MCHC: 32.6 g/dL (ref 30.0–36.0)
MCV: 93.8 fL (ref 78.0–100.0)
PLATELETS: 231 10*3/uL (ref 150–400)
RBC: 4.84 MIL/uL (ref 3.87–5.11)
RDW: 14 % (ref 11.5–15.5)
WBC: 5.8 10*3/uL (ref 4.0–10.5)

## 2016-03-09 MED ORDER — POTASSIUM CHLORIDE CRYS ER 20 MEQ PO TBCR: 20.0000 meq | EXTENDED_RELEASE_TABLET | Freq: Every day | ORAL | 6 refills | Status: DC

## 2016-03-09 NOTE — Patient Instructions (Addendum)
Cardioversion scheduled for Thursday, March 8th  - Arrive at the [facility] and go to admitting at 11:30AM  -Do not eat or drink anything after midnight the night prior to your procedure.  - Take all your medication with a sip of water prior to arrival.  - You will not be able to drive home after your procedure.  Your physician has recommended you make the following change in your medication:  1)After cardioversion decrease Amiodarone to 200mg  ONCE A DAY   Follow up with Dr. Saunders Revel in one month -- Scheduler will be in touch to schedule this appointment.

## 2016-03-09 NOTE — Progress Notes (Signed)
 Primary Care Physician: WHITE,CYNTHIA S, MD Referring Physician: Dr.    [name] [name] is a 70 y.o. female with a h/o chronic kidney diseasestage III, hypertension, hyperlipidemia, sleep apnea, and borderline diabetes who presented to Dr. Deterding's office for routine follow-up of CKD on 02/17/16 and was noted to be tachycardia with an irregularly irregular rhythm.She was seen by Dr. End in the office and admitted to [facility] With rapid atrial fibrillation.CHADSVASC=3 started on Eliquis 5 mg BID, IV Amiodarone transitioned to oral. She underwent TEE guided cardioversion on 02/24/16 converting to normal sinus rhythm.  She complained of extreme fatigue and heart rates down in the 50s and metoprolol was stopped 2/15. She returned to afib 2/17 at which time metoprolol was restarted at 12.5 mg bid and referred to the afib clinic.  She does not drink alcohol or consume large amounts of caffeine. She was diagnosed with OSA years ago, used CPAP for a while, but later stopped using the machine. She has been told that she needs a new sleep study but has not been ordered.  She states that her weight is up 10 lbs since she left the hospital. She feels full in the abdomen but no pedal edema, PND/orthopnea. She continues to have a cough. She had been dieting right before she went into the hospital and has lost a few lbs.  Pt returns to afib clinic and remains out of rhythm with aflutter with variable AV block with v rate of 97. She was started on lasix 20 mg a day since she complained on last visit of weight gain of 10 lbs. By our scales she has lost 6 lbs, home scale 8 lbs. Despite being out of rhythm, she feels improved. She has been loading on amiodarone now x 3 weeks and will  schedule repeat cardioversion next week. She had a prior neg TEE and has not missed any Doac doses. She has some chronic c/o of cough and burping which precedes afib/amio. She has h/o GERD and is off PPI due to concerns with renal function,  taking pepcid.  Today, she denies symptoms of palpitations, chest pain, shortness of breath, orthopnea, PND, lower extremity edema, dizziness, presyncope, syncope, or neurologic sequela. Positivefor improvement in fluid wt gain. Positive for chronic cough and burping. The patient is tolerating medications without difficulties and is otherwise without complaint today.   Past Medical History:  Diagnosis Date  . Anxiety   . Arthritis   . Cataracts, bilateral    immature  . CKD (chronic kidney disease) stage 3, GFR 30-59 ml/min   . Depression   . GERD (gastroesophageal reflux disease)    was on Nexium   . GERD (gastroesophageal reflux disease)   . Hemorrhoid   . History of bronchitis    couple of yrs ago  . History of colon polyps   . Hyperlipidemia    takes Pravastatin daily  . Hypertension    takes Amlodipine and Diovan daily  . Hypertension   . Sleep apnea    no cpap used  . Sleep apnea    study done 20+yrs ago and doesn't use cpap  . Urinary urgency   . Weakness    in left leg r/t back   Past Surgical History:  Procedure Laterality Date  . ABDOMINAL HYSTERECTOMY  1980   partial  . ABDOMINAL HYSTERECTOMY     partial  . BACK SURGERY    . CARDIOVERSION N/A 02/24/2016   Procedure: CARDIOVERSION;  Surgeon: Paula Ross V, MD;    Location: [facility];  Service: Cardiovascular;  Laterality: N/A;  . CARPAL TUNNEL RELEASE    . CHOLECYSTECTOMY  1975  . CHOLECYSTECTOMY    . COLONOSCOPY    . COLONOSCOPY WITH PROPOFOL  12/29/2011   Procedure: COLONOSCOPY WITH PROPOFOL;  Surgeon: Vincent C. Schooler, MD;  Location: [facility];  Service: Endoscopy;  Laterality: N/A;  . COLONOSCOPY WITH PROPOFOL N/A 05/24/2014   Procedure: COLONOSCOPY WITH PROPOFOL;  Surgeon: Vincent Schooler, MD;  Location: [facility];  Service: Endoscopy;  Laterality: N/A;  . ESOPHAGOGASTRODUODENOSCOPY    . FOOT SURGERY     left, right  . FRACTURE SURGERY     left leg-knee  . heel spurs removed Bilateral   .  HOT HEMOSTASIS  12/29/2011   Procedure: HOT HEMOSTASIS (ARGON PLASMA COAGULATION/BICAP);  Surgeon: Vincent C. Schooler, MD;  Location: [facility];  Service: Endoscopy;  Laterality: N/A;  . HOT HEMOSTASIS N/A 05/24/2014   Procedure: HOT HEMOSTASIS (ARGON PLASMA COAGULATION/BICAP);  Surgeon: Vincent Schooler, MD;  Location: [facility];  Service: Endoscopy;  Laterality: N/A;  . leg surgery d/t break Left   . LUMBAR LAMINECTOMY/DECOMPRESSION MICRODISCECTOMY Left 04/05/2013   Procedure: LUMBAR FOUR TO FIVE LUMBAR LAMINECTOMY/DECOMPRESSION MICRODISCECTOMY 1 LEVEL;  Surgeon: David S Jones, MD;  Location: [facility];  Service: Neurosurgery;  Laterality: Left;  . NISSEN FUNDOPLICATION    . TEE WITHOUT CARDIOVERSION N/A 02/24/2016   Procedure: TRANSESOPHAGEAL ECHOCARDIOGRAM (TEE);  Surgeon: Paula Ross V, MD;  Location: [facility];  Service: Cardiovascular;  Laterality: N/A;    Current Outpatient Prescriptions  Medication Sig Dispense Refill  . acetaminophen (TYLENOL) 500 MG tablet Take 500 mg by mouth every 6 (six) hours as needed. For pain    . amiodarone (PACERONE) 200 MG tablet Take 1 tablet (200 mg total) by mouth 2 (two) times daily. 60 tablet 1  . apixaban (ELIQUIS) 5 MG TABS tablet Take 1 tablet (5 mg total) by mouth 2 (two) times daily. 60 tablet 6  . Ascorbic Acid (VITAMIN C WITH ROSE HIPS) 1000 MG tablet Take 1,000 mg by mouth daily.    . budesonide-formoterol (SYMBICORT) 160-4.5 MCG/ACT inhaler Inhale 2 puffs into the lungs 2 (two) times daily. 1 Inhaler 6  . buPROPion (WELLBUTRIN XL) 150 MG 24 hr tablet Take 150 mg by mouth every morning.    . CALCIUM PO Take 1 tablet by mouth every morning.    . Cholecalciferol (VITAMIN D) 2000 UNITS tablet Take 4,000 Units by mouth daily.    . famotidine (PEPCID) 40 MG tablet Take 1 tablet (40 mg total) by mouth daily. (Patient taking differently: Take 40 mg by mouth 2 (two) times daily. ) 90 tablet 1  . furosemide (LASIX) 20 MG tablet Take 1 tablet  (20 mg total) by mouth daily. 30 tablet 2  . metoprolol tartrate (LOPRESSOR) 25 MG tablet 0.5 tablets 2 (two) times daily.    . pravastatin (PRAVACHOL) 40 MG tablet Take 40 mg by mouth at bedtime.     No current facility-administered medications for this encounter.     Allergies  Allergen Reactions  . Lexapro [Escitalopram]     Makes her feel funny  . Peroxide [Hydrogen Peroxide] Other (See Comments)    Redness.   . Zoloft [Sertraline Hcl]     Makes her feel funny  . Neosporin [Neomycin-Bacitracin Zn-Polymyx] Rash    Blisters, itching.     Social History   Social History  . Marital status: Married    Spouse name: N/A  . Number of   children: N/A  . Years of education: N/A   Occupational History  . Not on file.   Social History Main Topics  . Smoking status: Never Smoker  . Smokeless tobacco: Never Used  . Alcohol use No  . Drug use: No  . Sexual activity: Yes    Birth control/ protection: Surgical   Other Topics Concern  . Not on file   Social History Narrative   ** Merged History Encounter **        Family History  Problem Relation Age of Onset  . Colon cancer Mother   . Heart disease Father     ROS- All systems are reviewed and negative except as per the HPI above  Physical Exam: Vitals:   03/09/16 0849  BP: (!) 144/86  Pulse: 97  Weight: 225 lb (102.1 kg)  Height: 5' 7" (1.702 m)   Wt Readings from Last 3 Encounters:  03/09/16 225 lb (102.1 kg)  03/02/16 231 lb 3.2 oz (104.9 kg)  02/27/16 232 lb 1.9 oz (105.3 kg)    Labs: Lab Results  Component Value Date   NA 141 02/20/2016   K 3.9 02/20/2016   CL 102 02/20/2016   CO2 30 02/20/2016   GLUCOSE 131 (H) 02/20/2016   BUN 17 02/20/2016   CREATININE 1.35 (H) 02/20/2016   CALCIUM 9.6 02/20/2016   MG 1.7 02/17/2016   Lab Results  Component Value Date   INR 0.99 02/17/2016   Lab Results  Component Value Date   CHOL 125 02/18/2016   HDL 41 02/18/2016   LDLCALC 66 02/18/2016   TRIG 90  02/18/2016     GEN- The patient is well appearing, alert and oriented x 3 today.   Head- normocephalic, atraumatic Eyes-  Sclera clear, conjunctiva pink Ears- hearing intact Oropharynx- clear Neck- supple, no JVP Lymph- no cervical lymphadenopathy Lungs- Clear to ausculation bilaterally, normal work of breathing Heart- irregular rate and rhythm, no murmurs, rubs or gallops, PMI not laterally displaced GI- soft, NT, ND, + BS Extremities- no clubbing, cyanosis, or edema MS- no significant deformity or atrophy Skin- no rash or lesion Psych- euthymic mood, full affect Neuro- strength and sensation are intact  EKG-aflutter at 97 bpm, qrs int 76 ms, qtc 416 ms Epic records  TEE-Left ventricle:  LVEF is depressed Difficult to accurately assess LVEF as LV is foreshortened.  ------------------------------------------------------------------- Aortic valve:  AV is mildly thickened. Trace AI.  ------------------------------------------------------------------- Aorta:  Minimal fixed plaque in the thoracic aorta.  ------------------------------------------------------------------- Mitral valve:  MV is normal Moderate MR  ------------------------------------------------------------------- Left atrium:  LA is large.  No evidence of thrombus in the atrial cavity or appendage.  ------------------------------------------------------------------- Atrial septum:  Suspicious for small PFO by color doppler.  ------------------------------------------------------------------- Right ventricle:  RV size is normal RVEF is depressed.  ------------------------------------------------------------------- Pulmonic valve:   PV is normal.  ------------------------------------------------------------------- Tricuspid valve:  TV is normal Mild TR>  ------------------------------------------------------------------- Pericardium:  There was no pericardial effusion.     ------------------------------------------------------------------- Post procedure conclusions Ascending Aorta:  - Minimal fixed plaque in the thoracic aorta.  ------------------------------------------------------------------- Prepared and Electronically Authenticated by  Paula Ross, M.D. 2018-02-12T14:38:06    Assessment and Plan: 1. Persistent afib Returned to SR with amiodarone and cardioversion 2/12, but now has reverted back to afib after BB was stopped. She has resumed metoprolol 12.5 mg bid and has been loading on amiodarone a while longer  Cardioversion will now be scheduled for 3/8, risk vrs benefit of procedure discussed with pt and   daughter Continues on 200 mg bid of amiodarone for now for now, will decrease to 200 mg a day the day following cardioversion . Sleep study requested for h/o of untreated OSA Continue apixaban for a CHA2DS2VASc score of at least 4, no missed doses since initiation with previous neg TEE at time of prior cardioversion Bmet/mag today  2. Weight gain of 10 lbs since hospitalization  Improved Continue 20 mg daily for now Avoid salt    F/u afib clinic x one week following cardioversion Will reschedule with Dr. End within the month   [name] [name], ANP-C Afib Clinic [facility] [address] [place], [place] [address] [phone]    

## 2016-03-19 ENCOUNTER — Encounter (HOSPITAL_COMMUNITY)
Admission: RE | Disposition: A | Discharge status: Home / Self Care | Payer: Self-pay | Source: Ambulatory Visit | Attending: Internal Medicine

## 2016-03-19 ENCOUNTER — Ambulatory Visit (HOSPITAL_COMMUNITY): Payer: Medicare Other | Admitting: Anesthesiology

## 2016-03-19 ENCOUNTER — Ambulatory Visit (HOSPITAL_COMMUNITY)
Admission: RE | Admit: 2016-03-19 | Discharge: 2016-03-19 | Disposition: A | Discharge status: Home / Self Care | Payer: Medicare Other | Source: Ambulatory Visit | Attending: Internal Medicine | Admitting: Internal Medicine

## 2016-03-19 ENCOUNTER — Encounter (HOSPITAL_COMMUNITY): Payer: Self-pay | Admitting: *Deleted

## 2016-03-19 DIAGNOSIS — Z7951 Long term (current) use of inhaled steroids: Secondary | ICD-10-CM | POA: Diagnosis not present

## 2016-03-19 DIAGNOSIS — Z79899 Other long term (current) drug therapy: Secondary | ICD-10-CM | POA: Diagnosis not present

## 2016-03-19 DIAGNOSIS — Z883 Allergy status to other anti-infective agents status: Secondary | ICD-10-CM | POA: Insufficient documentation

## 2016-03-19 DIAGNOSIS — I4892 Unspecified atrial flutter: Secondary | ICD-10-CM | POA: Insufficient documentation

## 2016-03-19 DIAGNOSIS — Z90710 Acquired absence of both cervix and uterus: Secondary | ICD-10-CM | POA: Diagnosis not present

## 2016-03-19 DIAGNOSIS — Z800 Family history of malignant neoplasm of digestive organs: Secondary | ICD-10-CM | POA: Insufficient documentation

## 2016-03-19 DIAGNOSIS — Z7901 Long term (current) use of anticoagulants: Secondary | ICD-10-CM | POA: Insufficient documentation

## 2016-03-19 DIAGNOSIS — F419 Anxiety disorder, unspecified: Secondary | ICD-10-CM | POA: Diagnosis not present

## 2016-03-19 DIAGNOSIS — I129 Hypertensive chronic kidney disease with stage 1 through stage 4 chronic kidney disease, or unspecified chronic kidney disease: Secondary | ICD-10-CM | POA: Diagnosis not present

## 2016-03-19 DIAGNOSIS — G4733 Obstructive sleep apnea (adult) (pediatric): Secondary | ICD-10-CM | POA: Insufficient documentation

## 2016-03-19 DIAGNOSIS — F329 Major depressive disorder, single episode, unspecified: Secondary | ICD-10-CM | POA: Diagnosis not present

## 2016-03-19 DIAGNOSIS — I481 Persistent atrial fibrillation: Secondary | ICD-10-CM | POA: Diagnosis present

## 2016-03-19 DIAGNOSIS — Z881 Allergy status to other antibiotic agents status: Secondary | ICD-10-CM | POA: Diagnosis not present

## 2016-03-19 DIAGNOSIS — I4891 Unspecified atrial fibrillation: Secondary | ICD-10-CM

## 2016-03-19 DIAGNOSIS — Z9889 Other specified postprocedural states: Secondary | ICD-10-CM | POA: Insufficient documentation

## 2016-03-19 DIAGNOSIS — Z86010 Personal history of colonic polyps: Secondary | ICD-10-CM | POA: Diagnosis not present

## 2016-03-19 DIAGNOSIS — Z888 Allergy status to other drugs, medicaments and biological substances status: Secondary | ICD-10-CM | POA: Insufficient documentation

## 2016-03-19 DIAGNOSIS — N183 Chronic kidney disease, stage 3 (moderate): Secondary | ICD-10-CM | POA: Diagnosis not present

## 2016-03-19 DIAGNOSIS — Z8249 Family history of ischemic heart disease and other diseases of the circulatory system: Secondary | ICD-10-CM | POA: Diagnosis not present

## 2016-03-19 DIAGNOSIS — H269 Unspecified cataract: Secondary | ICD-10-CM | POA: Insufficient documentation

## 2016-03-19 DIAGNOSIS — K219 Gastro-esophageal reflux disease without esophagitis: Secondary | ICD-10-CM | POA: Diagnosis not present

## 2016-03-19 DIAGNOSIS — E785 Hyperlipidemia, unspecified: Secondary | ICD-10-CM | POA: Diagnosis not present

## 2016-03-19 DIAGNOSIS — Z9049 Acquired absence of other specified parts of digestive tract: Secondary | ICD-10-CM | POA: Insufficient documentation

## 2016-03-19 HISTORY — PX: CARDIOVERSION: SHX1299

## 2016-03-19 LAB — POCT I-STAT 4, (NA,K, GLUC, HGB,HCT)
Glucose, Bld: 108 mg/dL — ABNORMAL HIGH (ref 65–99)
HEMATOCRIT: 42 % (ref 36.0–46.0)
HEMOGLOBIN: 14.3 g/dL (ref 12.0–15.0)
Potassium: 4.1 mmol/L (ref 3.5–5.1)
Sodium: 142 mmol/L (ref 135–145)

## 2016-03-19 SURGERY — CARDIOVERSION
Anesthesia: General

## 2016-03-19 MED ORDER — SODIUM CHLORIDE 0.9 % IV SOLN
INTRAVENOUS | Status: DC
  Administered 2016-03-19: 12:00:00 via INTRAVENOUS

## 2016-03-19 MED ORDER — LIDOCAINE HCL (CARDIAC) 20 MG/ML IV SOLN
INTRAVENOUS | Status: DC | PRN
  Administered 2016-03-19: 60 mg via INTRAVENOUS

## 2016-03-19 MED ORDER — PROPOFOL 10 MG/ML IV BOLUS
INTRAVENOUS | Status: DC | PRN
  Administered 2016-03-19: 100 mg via INTRAVENOUS

## 2016-03-19 NOTE — Anesthesia Preprocedure Evaluation (Signed)
Anesthesia Evaluation  Patient identified by MRN, date of birth, ID band Patient awake    Reviewed: Allergy & Precautions, NPO status , Patient's Chart, lab work & pertinent test results  Airway Mallampati: II  TM Distance: >3 FB Neck ROM: Full    Dental no notable dental hx.    Pulmonary sleep apnea ,    Pulmonary exam normal breath sounds clear to auscultation       Cardiovascular hypertension, Pt. on medications + dysrhythmias Atrial Fibrillation  Rhythm:Irregular Rate:Normal     Neuro/Psych negative neurological ROS  negative psych ROS   GI/Hepatic Neg liver ROS, GERD  Medicated,  Endo/Other  negative endocrine ROS  Renal/GU negative Renal ROS  negative genitourinary   Musculoskeletal negative musculoskeletal ROS (+)   Abdominal   Peds negative pediatric ROS (+)  Hematology negative hematology ROS (+)   Anesthesia Other Findings   Reproductive/Obstetrics negative OB ROS                             Anesthesia Physical Anesthesia Plan  ASA: III  Anesthesia Plan: General   Post-op Pain Management:    Induction: Intravenous  Airway Management Planned: Mask and LMA  Additional Equipment:   Intra-op Plan:   Post-operative Plan:   Informed Consent: I have reviewed the patients History and Physical, chart, labs and discussed the procedure including the risks, benefits and alternatives for the proposed anesthesia with the patient or authorized representative who has indicated his/her understanding and acceptance.   Dental advisory given  Plan Discussed with: CRNA and Surgeon  Anesthesia Plan Comments:         Anesthesia Quick Evaluation

## 2016-03-19 NOTE — Anesthesia Postprocedure Evaluation (Signed)
Anesthesia Post Note  Patient: [name] [name]  Procedure(s) Performed: Procedure(s) (LRB): CARDIOVERSION (N/A)  Patient location during evaluation: PACU Anesthesia Type: General Level of consciousness: awake and alert Pain management: pain level controlled Vital Signs Assessment: post-procedure vital signs reviewed and stable Respiratory status: spontaneous breathing, nonlabored ventilation, respiratory function stable and patient connected to nasal cannula oxygen Cardiovascular status: blood pressure returned to baseline and stable Postop Assessment: no signs of nausea or vomiting Anesthetic complications: no       Last Vitals:  Vitals:   03/19/16 1256 03/19/16 1300  BP: 118/76 (!) 97/54  Pulse: (!) 59 (!) 59  Resp: 18 17  Temp: 36.4 C     Last Pain:  Vitals:   03/19/16 1256  TempSrc: Oral                 [name]

## 2016-03-19 NOTE — H&P (View-Only) (Signed)
Primary Care Physician: Vidal Schwalbe, MD Referring Physician: Dr.    Fatima Sanger is a 71 y.o. female with a h/o chronic kidney diseasestage III, hypertension, hyperlipidemia, sleep apnea, and borderline diabetes who presented to Dr. Deterding's office for routine follow-up of CKD on 02/17/16 and was noted to be tachycardia with an irregularly irregular rhythm.She was seen by Dr. Saunders Revel in the office and admitted to [facility] With rapid atrial fibrillation.CHADSVASC=3 started on Eliquis 5 mg BID, IV Amiodarone transitioned to oral. She underwent TEE guided cardioversion on 02/24/16 converting to normal sinus rhythm.  She complained of extreme fatigue and heart rates down in the 50s and metoprolol was stopped 2/15. She returned to afib 2/17 at which time metoprolol was restarted at 12.5 mg bid and referred to the afib clinic.  She does not drink alcohol or consume large amounts of caffeine. She was diagnosed with OSA years ago, used CPAP for a while, but later stopped using the machine. She has been told that she needs a new sleep study but has not been ordered.  She states that her weight is up 10 lbs since she left the hospital. She feels full in the abdomen but no pedal edema, PND/orthopnea. She continues to have a cough. She had been dieting right before she went into the hospital and has lost a few lbs.  Pt returns to afib clinic and remains out of rhythm with aflutter with variable AV block with v rate of 97. She was started on lasix 20 mg a day since she complained on last visit of weight gain of 10 lbs. By our scales she has lost 6 lbs, home scale 8 lbs. Despite being out of rhythm, she feels improved. She has been loading on amiodarone now x 3 weeks and will  schedule repeat cardioversion next week. She had a prior neg TEE and has not missed any Doac doses. She has some chronic c/o of cough and burping which precedes afib/amio. She has h/o GERD and is off PPI due to concerns with renal function,  taking pepcid.  Today, she denies symptoms of palpitations, chest pain, shortness of breath, orthopnea, PND, lower extremity edema, dizziness, presyncope, syncope, or neurologic sequela. Positivefor improvement in fluid wt gain. Positive for chronic cough and burping. The patient is tolerating medications without difficulties and is otherwise without complaint today.   Past Medical History:  Diagnosis Date  . Anxiety   . Arthritis   . Cataracts, bilateral    immature  . CKD (chronic kidney disease) stage 3, GFR 30-59 ml/min   . Depression   . GERD (gastroesophageal reflux disease)    was on Nexium   . GERD (gastroesophageal reflux disease)   . Hemorrhoid   . History of bronchitis    couple of yrs ago  . History of colon polyps   . Hyperlipidemia    takes Pravastatin daily  . Hypertension    takes Amlodipine and Diovan daily  . Hypertension   . Sleep apnea    no cpap used  . Sleep apnea    study done 20+yrs ago and doesn't use cpap  . Urinary urgency   . Weakness    in left leg r/t back   Past Surgical History:  Procedure Laterality Date  . ABDOMINAL HYSTERECTOMY  1980   partial  . ABDOMINAL HYSTERECTOMY     partial  . BACK SURGERY    . CARDIOVERSION N/A 02/24/2016   Procedure: CARDIOVERSION;  Surgeon: Fay Records, MD;  Location: [facility] ENDOSCOPY;  Service: Cardiovascular;  Laterality: N/A;  . CARPAL TUNNEL RELEASE    . CHOLECYSTECTOMY  1975  . CHOLECYSTECTOMY    . COLONOSCOPY    . COLONOSCOPY WITH PROPOFOL  12/29/2011   Procedure: COLONOSCOPY WITH PROPOFOL;  Surgeon: Lear Ng, MD;  Location: [facility] ENDOSCOPY;  Service: Endoscopy;  Laterality: N/A;  . COLONOSCOPY WITH PROPOFOL N/A 05/24/2014   Procedure: COLONOSCOPY WITH PROPOFOL;  Surgeon: Wilford Corner, MD;  Location: [facility] ENDOSCOPY;  Service: Endoscopy;  Laterality: N/A;  . ESOPHAGOGASTRODUODENOSCOPY    . FOOT SURGERY     left, right  . FRACTURE SURGERY     left leg-knee  . heel spurs removed Bilateral   .  HOT HEMOSTASIS  12/29/2011   Procedure: HOT HEMOSTASIS (ARGON PLASMA COAGULATION/BICAP);  Surgeon: Lear Ng, MD;  Location: [facility];  Service: Endoscopy;  Laterality: N/A;  . HOT HEMOSTASIS N/A 05/24/2014   Procedure: HOT HEMOSTASIS (ARGON PLASMA COAGULATION/BICAP);  Surgeon: Wilford Corner, MD;  Location: [facility];  Service: Endoscopy;  Laterality: N/A;  . leg surgery d/t break Left   . LUMBAR LAMINECTOMY/DECOMPRESSION MICRODISCECTOMY Left 04/05/2013   Procedure: LUMBAR FOUR TO FIVE LUMBAR LAMINECTOMY/DECOMPRESSION MICRODISCECTOMY 1 LEVEL;  Surgeon: Eustace Moore, MD;  Location: [facility];  Service: Neurosurgery;  Laterality: Left;  . NISSEN FUNDOPLICATION    . TEE WITHOUT CARDIOVERSION N/A 02/24/2016   Procedure: TRANSESOPHAGEAL ECHOCARDIOGRAM (TEE);  Surgeon: Fay Records, MD;  Location: [facility] ENDOSCOPY;  Service: Cardiovascular;  Laterality: N/A;    Current Outpatient Prescriptions  Medication Sig Dispense Refill  . acetaminophen (TYLENOL) 500 MG tablet Take 500 mg by mouth every 6 (six) hours as needed. For pain    . amiodarone (PACERONE) 200 MG tablet Take 1 tablet (200 mg total) by mouth 2 (two) times daily. 60 tablet 1  . apixaban (ELIQUIS) 5 MG TABS tablet Take 1 tablet (5 mg total) by mouth 2 (two) times daily. 60 tablet 6  . Ascorbic Acid (VITAMIN C WITH ROSE HIPS) 1000 MG tablet Take 1,000 mg by mouth daily.    . budesonide-formoterol (SYMBICORT) 160-4.5 MCG/ACT inhaler Inhale 2 puffs into the lungs 2 (two) times daily. 1 Inhaler 6  . buPROPion (WELLBUTRIN XL) 150 MG 24 hr tablet Take 150 mg by mouth every morning.    Marland Kitchen CALCIUM PO Take 1 tablet by mouth every morning.    . Cholecalciferol (VITAMIN D) 2000 UNITS tablet Take 4,000 Units by mouth daily.    . famotidine (PEPCID) 40 MG tablet Take 1 tablet (40 mg total) by mouth daily. (Patient taking differently: Take 40 mg by mouth 2 (two) times daily. ) 90 tablet 1  . furosemide (LASIX) 20 MG tablet Take 1 tablet  (20 mg total) by mouth daily. 30 tablet 2  . metoprolol tartrate (LOPRESSOR) 25 MG tablet 0.5 tablets 2 (two) times daily.    . pravastatin (PRAVACHOL) 40 MG tablet Take 40 mg by mouth at bedtime.     No current facility-administered medications for this encounter.     Allergies  Allergen Reactions  . Lexapro [Escitalopram]     Makes her feel funny  . Peroxide [Hydrogen Peroxide] Other (See Comments)    Redness.   [place] [Sertraline Hcl]     Makes her feel funny  . Neosporin [Neomycin-Bacitracin Zn-Polymyx] Rash    Blisters, itching.     Social History   Social History  . Marital status: Married    Spouse name: N/A  . Number of  children: N/A  . Years of education: N/A   Occupational History  . Not on file.   Social History Main Topics  . Smoking status: Never Smoker  . Smokeless tobacco: Never Used  . Alcohol use No  . Drug use: No  . Sexual activity: Yes    Birth control/ protection: Surgical   Other Topics Concern  . Not on file   Social History Narrative   ** Merged History Encounter **        Family History  Problem Relation Age of Onset  . Colon cancer Mother   . Heart disease Father     ROS- All systems are reviewed and negative except as per the HPI above  Physical Exam: Vitals:   03/09/16 0849  BP: (!) 144/86  Pulse: 97  Weight: 225 lb (102.1 kg)  Height: 5\' 7"  (1.702 m)   Wt Readings from Last 3 Encounters:  03/09/16 225 lb (102.1 kg)  03/02/16 231 lb 3.2 oz (104.9 kg)  02/27/16 232 lb 1.9 oz (105.3 kg)    Labs: Lab Results  Component Value Date   NA 141 02/20/2016   K 3.9 02/20/2016   CL 102 02/20/2016   CO2 30 02/20/2016   GLUCOSE 131 (H) 02/20/2016   BUN 17 02/20/2016   CREATININE 1.35 (H) 02/20/2016   CALCIUM 9.6 02/20/2016   MG 1.7 02/17/2016   Lab Results  Component Value Date   INR 0.99 02/17/2016   Lab Results  Component Value Date   CHOL 125 02/18/2016   HDL 41 02/18/2016   LDLCALC 66 02/18/2016   TRIG 90  02/18/2016     GEN- The patient is well appearing, alert and oriented x 3 today.   Head- normocephalic, atraumatic Eyes-  Sclera clear, conjunctiva pink Ears- hearing intact Oropharynx- clear Neck- supple, no JVP Lymph- no cervical lymphadenopathy Lungs- Clear to ausculation bilaterally, normal work of breathing Heart- irregular rate and rhythm, no murmurs, rubs or gallops, PMI not laterally displaced GI- soft, NT, ND, + BS Extremities- no clubbing, cyanosis, or edema MS- no significant deformity or atrophy Skin- no rash or lesion Psych- euthymic mood, full affect Neuro- strength and sensation are intact  EKG-aflutter at 97 bpm, qrs int 76 ms, qtc 416 ms Epic records  TEE-Left ventricle:  LVEF is depressed Difficult to accurately assess LVEF as LV is foreshortened.  ------------------------------------------------------------------- Aortic valve:  AV is mildly thickened. Trace AI.  ------------------------------------------------------------------- Aorta:  Minimal fixed plaque in the thoracic aorta.  ------------------------------------------------------------------- Mitral valve:  MV is normal Moderate MR  ------------------------------------------------------------------- Left atrium:  LA is large.  No evidence of thrombus in the atrial cavity or appendage.  ------------------------------------------------------------------- Atrial septum:  Suspicious for small PFO by color doppler.  ------------------------------------------------------------------- Right ventricle:  RV size is normal RVEF is depressed.  ------------------------------------------------------------------- Pulmonic valve:   PV is normal.  ------------------------------------------------------------------- Tricuspid valve:  TV is normal Mild TR>  ------------------------------------------------------------------- Pericardium:  There was no pericardial effusion.     ------------------------------------------------------------------- Post procedure conclusions Ascending Aorta:  - Minimal fixed plaque in the thoracic aorta.  ------------------------------------------------------------------- Prepared and Electronically Authenticated by  [name], M.D. 2018-02-12T14:38:06    Assessment and Plan: 1. Persistent afib Returned to SR with amiodarone and cardioversion 2/12, but now has reverted back to afib after BB was stopped. She has resumed metoprolol 12.5 mg bid and has been loading on amiodarone a while longer  Cardioversion will now be scheduled for 3/8, risk vrs benefit of procedure discussed with pt and  daughter Continues on 200 mg bid of amiodarone for now for now, will decrease to 200 mg a day the day following cardioversion . Sleep study requested for h/o of untreated OSA Continue apixaban for a CHA2DS2VASc score of at least 4, no missed doses since initiation with previous neg TEE at time of prior cardioversion Bmet/mag today  2. Weight gain of 10 lbs since hospitalization  Improved Continue 20 mg daily for now Avoid salt    F/u afib clinic x one week following cardioversion Will reschedule with Dr. Saunders Revel within the month   Butch Penny C. Adaria Hole, [facility] [address] [place], [place] [address] [phone]

## 2016-03-19 NOTE — Transfer of Care (Signed)
Immediate Anesthesia Transfer of Care Note  Patient: [name] [name]  Procedure(s) Performed: Procedure(s): CARDIOVERSION (N/A)  Patient Location: PACU  Anesthesia Type:General  Level of Consciousness: awake, alert , oriented and patient cooperative  Airway & Oxygen Therapy: Patient Spontanous Breathing and Patient connected to nasal cannula oxygen  Post-op Assessment: Report given to RN, Post -op Vital signs reviewed and stable, Patient moving all extremities and Patient moving all extremities X 4  Post vital signs: Reviewed and stable  Last Vitals:  Vitals:   03/19/16 1132  BP: (!) 155/92  Pulse: (!) 125  Resp: 13  Temp: 36.4 C    Last Pain:  Vitals:   03/19/16 1132  TempSrc: Oral         Complications: No apparent anesthesia complications

## 2016-03-19 NOTE — OR Nursing (Signed)
Dr. Harrington Challenger repostiioned new padz for the third attempt to cardiovert

## 2016-03-19 NOTE — Op Note (Signed)
Patient sedated by anesthesia with Propofol and Lidocaine  Cardioversions attempted with pads in the AP position Initial attempt with 150 J synchronized biphasic energy was unsuccessful. Repeat attemp with 200 J synchronized biphasic energy was unsuccessful New pads placed in the Apex/Base position.  With 200 J synchronized biphasic energy pt cardioverted to SR. 12 lead EKG pending

## 2016-03-19 NOTE — Interval H&P Note (Signed)
History and Physical Interval Note:  03/19/2016 12:34 PM  [name] [name]  has presented today for surgery, with the diagnosis of AFIB  The various methods of treatment have been discussed with the patient and family. After consideration of risks, benefits and other options for treatment, the patient has consented to  Procedure(s): CARDIOVERSION (N/A) as a surgical intervention .  The patient's history has been reviewed, patient examined, no change in status, stable for surgery.  I have reviewed the patient's chart and labs.  Questions were answered to the patient's satisfaction.     [name]

## 2016-03-19 NOTE — Discharge Instructions (Signed)
Electrical Cardioversion, Care After °This sheet gives you information about how to care for yourself after your procedure. Your health care provider may also give you more specific instructions. If you have problems or questions, contact your health care provider. °What can I expect after the procedure? °After the procedure, it is common to have: °· Some redness on the skin where the shocks were given. °Follow these instructions at home: °· Do not drive for 24 hours if you were given a medicine to help you relax (sedative). °· Take over-the-counter and prescription medicines only as told by your health care provider. °· Ask your health care provider how to check your pulse. Check it often. °· Rest for 48 hours after the procedure or as told by your health care provider. °· Avoid or limit your caffeine use as told by your health care provider. °Contact a health care provider if: °· You feel like your heart is beating too quickly or your pulse is not regular. °· You have a serious muscle cramp that does not go away. °Get help right away if: °· You have discomfort in your chest. °· You are dizzy or you feel faint. °· You have trouble breathing or you are short of breath. °· Your speech is slurred. °· You have trouble moving an arm or leg on one side of your body. °· Your fingers or toes turn cold or blue. °This information is not intended to replace advice given to you by your health care provider. Make sure you discuss any questions you have with your health care provider. °Document Released: 10/19/2012 Document Revised: 08/02/2015 Document Reviewed: 07/05/2015 °Elsevier Interactive Patient Education © 2017 [employer] ° °

## 2016-03-22 ENCOUNTER — Encounter (HOSPITAL_COMMUNITY): Payer: Self-pay | Admitting: Internal Medicine

## 2016-03-23 ENCOUNTER — Ambulatory Visit: Payer: Medicare Other | Admitting: Pulmonary Disease

## 2016-03-25 ENCOUNTER — Encounter: Payer: Self-pay | Admitting: Internal Medicine

## 2016-03-26 ENCOUNTER — Ambulatory Visit (HOSPITAL_COMMUNITY)
Admission: RE | Admit: 2016-03-26 | Discharge: 2016-03-26 | Disposition: A | Discharge status: Home / Self Care | Payer: Medicare Other | Source: Ambulatory Visit | Attending: Nurse Practitioner | Admitting: Nurse Practitioner

## 2016-03-26 ENCOUNTER — Encounter (HOSPITAL_COMMUNITY): Payer: Self-pay | Admitting: Nurse Practitioner

## 2016-03-26 VITALS — BP 140/76 | HR 51 | Ht 67.0 in | Wt 232.0 lb

## 2016-03-26 DIAGNOSIS — I129 Hypertensive chronic kidney disease with stage 1 through stage 4 chronic kidney disease, or unspecified chronic kidney disease: Secondary | ICD-10-CM | POA: Diagnosis not present

## 2016-03-26 DIAGNOSIS — Z79899 Other long term (current) drug therapy: Secondary | ICD-10-CM | POA: Insufficient documentation

## 2016-03-26 DIAGNOSIS — E669 Obesity, unspecified: Secondary | ICD-10-CM | POA: Insufficient documentation

## 2016-03-26 DIAGNOSIS — N183 Chronic kidney disease, stage 3 (moderate): Secondary | ICD-10-CM | POA: Insufficient documentation

## 2016-03-26 DIAGNOSIS — I481 Persistent atrial fibrillation: Secondary | ICD-10-CM | POA: Insufficient documentation

## 2016-03-26 DIAGNOSIS — E785 Hyperlipidemia, unspecified: Secondary | ICD-10-CM | POA: Insufficient documentation

## 2016-03-26 DIAGNOSIS — F419 Anxiety disorder, unspecified: Secondary | ICD-10-CM | POA: Insufficient documentation

## 2016-03-26 DIAGNOSIS — G4733 Obstructive sleep apnea (adult) (pediatric): Secondary | ICD-10-CM | POA: Insufficient documentation

## 2016-03-26 DIAGNOSIS — R609 Edema, unspecified: Secondary | ICD-10-CM | POA: Insufficient documentation

## 2016-03-26 DIAGNOSIS — F329 Major depressive disorder, single episode, unspecified: Secondary | ICD-10-CM | POA: Diagnosis not present

## 2016-03-26 DIAGNOSIS — Z888 Allergy status to other drugs, medicaments and biological substances status: Secondary | ICD-10-CM | POA: Insufficient documentation

## 2016-03-26 DIAGNOSIS — R7303 Prediabetes: Secondary | ICD-10-CM | POA: Insufficient documentation

## 2016-03-26 DIAGNOSIS — R05 Cough: Secondary | ICD-10-CM | POA: Diagnosis not present

## 2016-03-26 DIAGNOSIS — Z6836 Body mass index (BMI) 36.0-36.9, adult: Secondary | ICD-10-CM | POA: Insufficient documentation

## 2016-03-26 DIAGNOSIS — Z7901 Long term (current) use of anticoagulants: Secondary | ICD-10-CM | POA: Diagnosis not present

## 2016-03-26 DIAGNOSIS — I4819 Other persistent atrial fibrillation: Secondary | ICD-10-CM

## 2016-03-26 DIAGNOSIS — K219 Gastro-esophageal reflux disease without esophagitis: Secondary | ICD-10-CM | POA: Diagnosis not present

## 2016-03-26 MED ORDER — AMIODARONE HCL 200 MG PO TABS: 200.0000 mg | ORAL_TABLET | Freq: Every day | ORAL | 1 refills | Status: DC

## 2016-03-26 MED ORDER — METOPROLOL SUCCINATE ER 25 MG PO TB24: 12.5000 mg | ORAL_TABLET | Freq: Every day | ORAL | 3 refills | Status: DC

## 2016-03-26 NOTE — Patient Instructions (Signed)
Your physician has recommended you make the following change in your medication:  1)Stop metoprolol tartrate (lopressor) 2)Start Metoprolol succinate (toprol) -- take 1/2 tablet at bedtime (1/2 of the 25mg  tablet) 3)Reduce Amiodarone to 200mg  once a day

## 2016-03-26 NOTE — Progress Notes (Signed)
Primary Care Physician: Vidal Schwalbe, MD Referring Physician: Dr.    Fatima Sanger is a 71 y.o. female with a h/o chronic kidney diseasestage III, hypertension, hyperlipidemia, sleep apnea, and borderline diabetes who presented to Dr. Deterding's office for routine follow-up of CKD on 02/17/16 and was noted to be tachycardia with an irregularly irregular rhythm.She was seen by Dr. Saunders Revel in the office and admitted to [facility] With rapid atrial fibrillation.CHADSVASC=3 started on Eliquis 5 mg BID, IV Amiodarone transitioned to oral. She underwent TEE guided cardioversion on 02/24/16 converting to normal sinus rhythm.  She complained of extreme fatigue and heart rates down in the 50s and metoprolol was stopped 2/15. She returned to afib 2/17 at which time metoprolol was restarted at 12.5 mg bid and referred to the afib clinic.  She does not drink alcohol or consume large amounts of caffeine. She was diagnosed with OSA years ago, used CPAP for a while, but later stopped using the machine. She has been told that she needs a new sleep study but has not been ordered.  She states that her weight is up 10 lbs since she left the hospital. She feels full in the abdomen but no pedal edema, PND/orthopnea. She continues to have a cough. She had been dieting right before she went into the hospital and has lost a few lbs.  Pt returns to afib clinic and remains out of rhythm with aflutter with variable AV block with v rate of 97. She was started on lasix 20 mg a day since she complained on last visit of weight gain of 10 lbs. By our scales she has lost 6 lbs, home scale 8 lbs. Despite being out of rhythm, she feels improved. She has been loading on amiodarone now x 3 weeks and will  schedule repeat cardioversion next week. She had a prior neg TEE and has not missed any Doac doses. She has some chronic c/o of cough and burping which precedes afib/amio. She has h/o GERD and is off PPI due to concerns with renal function,  taking pepcid.  F/u cardioversion, 3/15. She continues in SR. Amiodarone was kept at bid following cardioversion due to the fact that it took 3 shocks to return to SR. She is c/o of fatigue although back in SR. She is struggling with cough which sounds like GERD. PPI was stopped by renal MD due to CKD.   Today, she denies symptoms of palpitations, chest pain, shortness of breath, orthopnea, PND, lower extremity edema, dizziness, presyncope, syncope, or neurologic sequela. Positivefor improvement in fluid wt gain. Positive for chronic cough and burping. The patient is tolerating medications without difficulties and is otherwise without complaint today.   Past Medical History:  Diagnosis Date  . Anxiety   . Arthritis   . Cataracts, bilateral    immature  . CKD (chronic kidney disease) stage 3, GFR 30-59 ml/min   . Depression   . GERD (gastroesophageal reflux disease)    was on Nexium   . GERD (gastroesophageal reflux disease)   . Hemorrhoid   . History of bronchitis    couple of yrs ago  . History of colon polyps   . Hyperlipidemia    takes Pravastatin daily  . Hypertension    takes Amlodipine and Diovan daily  . Hypertension   . Sleep apnea    no cpap used  . Sleep apnea    study done 20+yrs ago and doesn't use cpap  . Urinary urgency   . Weakness  in left leg r/t back   Past Surgical History:  Procedure Laterality Date  . ABDOMINAL HYSTERECTOMY  1980   partial  . ABDOMINAL HYSTERECTOMY     partial  . BACK SURGERY    . CARDIOVERSION N/A 02/24/2016   Procedure: CARDIOVERSION;  Surgeon: Fay Records, MD;  Location: [facility] ENDOSCOPY;  Service: Cardiovascular;  Laterality: N/A;  . CARDIOVERSION N/A 03/19/2016   Procedure: CARDIOVERSION;  Surgeon: Fay Records, MD;  Location: [place];  Service: Cardiovascular;  Laterality: N/A;  . CARPAL TUNNEL RELEASE    . CHOLECYSTECTOMY  1975  . CHOLECYSTECTOMY    . COLONOSCOPY    . COLONOSCOPY WITH PROPOFOL  12/29/2011   Procedure:  COLONOSCOPY WITH PROPOFOL;  Surgeon: Lear Ng, MD;  Location: [facility] ENDOSCOPY;  Service: Endoscopy;  Laterality: N/A;  . COLONOSCOPY WITH PROPOFOL N/A 05/24/2014   Procedure: COLONOSCOPY WITH PROPOFOL;  Surgeon: Wilford Corner, MD;  Location: [facility] ENDOSCOPY;  Service: Endoscopy;  Laterality: N/A;  . ESOPHAGOGASTRODUODENOSCOPY    . FOOT SURGERY     left, right  . FRACTURE SURGERY     left leg-knee  . heel spurs removed Bilateral   . HOT HEMOSTASIS  12/29/2011   Procedure: HOT HEMOSTASIS (ARGON PLASMA COAGULATION/BICAP);  Surgeon: Lear Ng, MD;  Location: [facility];  Service: Endoscopy;  Laterality: N/A;  . HOT HEMOSTASIS N/A 05/24/2014   Procedure: HOT HEMOSTASIS (ARGON PLASMA COAGULATION/BICAP);  Surgeon: Wilford Corner, MD;  Location: [facility];  Service: Endoscopy;  Laterality: N/A;  . leg surgery d/t break Left   . LUMBAR LAMINECTOMY/DECOMPRESSION MICRODISCECTOMY Left 04/05/2013   Procedure: LUMBAR FOUR TO FIVE LUMBAR LAMINECTOMY/DECOMPRESSION MICRODISCECTOMY 1 LEVEL;  Surgeon: Eustace Moore, MD;  Location: [place] [facility];  Service: Neurosurgery;  Laterality: Left;  . NISSEN FUNDOPLICATION    . TEE WITHOUT CARDIOVERSION N/A 02/24/2016   Procedure: TRANSESOPHAGEAL ECHOCARDIOGRAM (TEE);  Surgeon: Fay Records, MD;  Location: [facility] ENDOSCOPY;  Service: Cardiovascular;  Laterality: N/A;    Current Outpatient Prescriptions  Medication Sig Dispense Refill  . acetaminophen (TYLENOL) 500 MG tablet Take 500 mg by mouth every 6 (six) hours as needed. For pain    . amiodarone (PACERONE) 200 MG tablet Take 1 tablet (200 mg total) by mouth daily. 60 tablet 1  . apixaban (ELIQUIS) 5 MG TABS tablet Take 1 tablet (5 mg total) by mouth 2 (two) times daily. 60 tablet 6  . Ascorbic Acid (VITAMIN C WITH ROSE HIPS) 1000 MG tablet Take 1,000 mg by mouth daily.    . budesonide-formoterol (SYMBICORT) 160-4.5 MCG/ACT inhaler Inhale 2 puffs into the lungs 2 (two) times daily. 1 Inhaler 6  .  buPROPion (WELLBUTRIN XL) 150 MG 24 hr tablet Take 150 mg by mouth every morning.    Marland Kitchen CALCIUM PO Take 1 tablet by mouth every morning.    . Cholecalciferol (VITAMIN D) 2000 UNITS tablet Take 4,000 Units by mouth daily.    . famotidine (PEPCID) 40 MG tablet Take 1 tablet (40 mg total) by mouth daily. (Patient taking differently: Take 40 mg by mouth 2 (two) times daily. ) 90 tablet 1  . furosemide (LASIX) 20 MG tablet Take 1 tablet (20 mg total) by mouth daily. 30 tablet 2  . potassium chloride SA (K-DUR,KLOR-CON) 20 MEQ tablet Take 1 tablet (20 mEq total) by mouth daily. 30 tablet 6  . pravastatin (PRAVACHOL) 40 MG tablet Take 40 mg by mouth at bedtime.    . metoprolol succinate (TOPROL XL) 25 MG 24 hr  tablet Take 0.5 tablets (12.5 mg total) by mouth daily. 30 tablet 3   No current facility-administered medications for this encounter.     Allergies  Allergen Reactions  . Lexapro [Escitalopram]     Makes her feel funny  . Peroxide [Hydrogen Peroxide] Other (See Comments)    Redness.   [place] [Sertraline Hcl]     Makes her feel funny  . Neosporin [Neomycin-Bacitracin Zn-Polymyx] Rash    Blisters, itching.     Social History   Social History  . Marital status: Married    Spouse name: N/A  . Number of children: N/A  . Years of education: N/A   Occupational History  . Not on file.   Social History Main Topics  . Smoking status: Never Smoker  . Smokeless tobacco: Never Used  . Alcohol use No  . Drug use: No  . Sexual activity: Yes    Birth control/ protection: Surgical   Other Topics Concern  . Not on file   Social History Narrative   ** Merged History Encounter **        Family History  Problem Relation Age of Onset  . Colon cancer Mother   . Heart disease Father     ROS- All systems are reviewed and negative except as per the HPI above  Physical Exam: Vitals:   03/26/16 0849  BP: 140/76  Pulse: (!) 51  Weight: 232 lb (105.2 kg)  Height: 5\' 7"  (1.702 m)     Wt Readings from Last 3 Encounters:  03/26/16 232 lb (105.2 kg)  03/09/16 225 lb (102.1 kg)  03/02/16 231 lb 3.2 oz (104.9 kg)    Labs: Lab Results  Component Value Date   NA 142 03/19/2016   K 4.1 03/19/2016   CL 105 03/09/2016   CO2 27 03/09/2016   GLUCOSE 108 (H) 03/19/2016   BUN 15 03/09/2016   CREATININE 1.30 (H) 03/09/2016   CALCIUM 9.3 03/09/2016   MG 1.7 02/17/2016   Lab Results  Component Value Date   INR 0.99 02/17/2016   Lab Results  Component Value Date   CHOL 125 02/18/2016   HDL 41 02/18/2016   LDLCALC 66 02/18/2016   TRIG 90 02/18/2016     GEN- The patient is well appearing, alert and oriented x 3 today.   Head- normocephalic, atraumatic Eyes-  Sclera clear, conjunctiva pink Ears- hearing intact Oropharynx- clear Neck- supple, no JVP Lymph- no cervical lymphadenopathy Lungs- Clear to ausculation bilaterally, normal work of breathing Heart-regular rate and rhythm, no murmurs, rubs or gallops, PMI not laterally displaced GI- soft, NT, ND, + BS Extremities- no clubbing, cyanosis, or edema MS- no significant deformity or atrophy Skin- no rash or lesion Psych- euthymic mood, full affect Neuro- strength and sensation are intact  Echo- 02/18/16-LV EF: 50% -   55%  ------------------------------------------------------------------- Indications:      Atrial fibrillation - 427.31.  ------------------------------------------------------------------- History:   Risk factors:  Hypertension. Obese.  ------------------------------------------------------------------- Study Conclusions  - Left ventricle: The cavity size was normal. Wall thickness was   normal. Systolic function was normal. The estimated ejection   fraction was in the range of 50% to 55%. Wall motion was normal;   there were no regional wall motion abnormalities. - Mitral valve: Calcified annulus. There was mild regurgitation. - Left atrium: The atrium was moderately dilated.    Anterior-posterior dimension: 48 mm. - Right ventricle: The cavity size was mildly dilated. Wall   thickness was normal. - Right atrium:  The atrium was severely dilated. - Tricuspid valve: There was mild regurgitation. - Pulmonary arteries: Systolic pressure was mildly increased. PA   peak pressure: 37 mm Hg (S).    Assessment and Plan: 1. Persistent afib Returned to SR with amiodarone and cardioversion 3/8, but failed earlier cardioversion 2/8. She has resumed metoprolol 12.5 mg bid and has been loading on amiodarone a while longer  Reduce amiodarone to 200 mg a day Change metoprolol to succinate and take 1/2 tab at bedtime, to see if fatigue is improved Sleep study requested for h/o of untreated OSA Continue apixaban for a CHA2DS2VASc score of at least 4, no missed doses   2. Fluid retention Continue lasix   3. Cough Sounds like GERD To further discuss with PCP/GI Pt states that nephrologist stopped PPI due to kidney disease and I conferred with 3 hospital pharmacists and they stated they could find no reason to avoid PPI's in kidney disease. Again, she should discuss with PCP/GI, for it sounded that her reflux was much better treated with PPI's.  F/u Dr. Saunders Revel 3/23 afib clinic as needed   Butch Penny C. Aisley Whan, [facility] [address] [place], [place] [address] [phone]

## 2016-04-03 ENCOUNTER — Ambulatory Visit (INDEPENDENT_AMBULATORY_CARE_PROVIDER_SITE_OTHER): Payer: Medicare Other | Admitting: Internal Medicine

## 2016-04-03 ENCOUNTER — Encounter: Payer: Self-pay | Admitting: Internal Medicine

## 2016-04-03 VITALS — BP 138/78 | HR 57 | Ht 67.0 in | Wt 229.2 lb

## 2016-04-03 DIAGNOSIS — I481 Persistent atrial fibrillation: Secondary | ICD-10-CM

## 2016-04-03 DIAGNOSIS — I10 Essential (primary) hypertension: Secondary | ICD-10-CM | POA: Diagnosis not present

## 2016-04-03 DIAGNOSIS — I4819 Other persistent atrial fibrillation: Secondary | ICD-10-CM

## 2016-04-03 DIAGNOSIS — R0609 Other forms of dyspnea: Secondary | ICD-10-CM

## 2016-04-03 DIAGNOSIS — R0600 Dyspnea, unspecified: Secondary | ICD-10-CM

## 2016-04-03 DIAGNOSIS — K219 Gastro-esophageal reflux disease without esophagitis: Secondary | ICD-10-CM

## 2016-04-03 HISTORY — DX: Other forms of dyspnea: R06.09

## 2016-04-03 NOTE — Progress Notes (Signed)
h  Follow-up Outpatient Visit Date: 04/03/2016  Primary Care Provider: Vidal Schwalbe, MD  [place] [address]  Chief Complaint: Tachycardia  HPI:  [name] [name] is a 71 y.o. year-old female with history of chronic kidney disease stage III, hypertension, hyperlipidemia, sleep apnea, and borderline diabetes, who presents for follow-up of persistent atrial fibrillation. I last saw the patient on 02/17/16, at which time she was noted to be in a-fib with RVR. Though she was minimally symptomatic, she was directly admitted for further management. Her admission was complicated by hypotension limiting uptitration of rate control medication. She was ultimately started on amiodrone and underwent successful TEE-guided DCCV. However, at the time of post-hospital follow-up, she had reverted back to atrial fibrillation. Second cardioversion was performed after further loading with amiodarone. At her last visit, metoprolol was switched to succinate form due to fatigue.  Today, [name] [name] reports feeling well. She notes exertional dyspnea, that has been stable. She noted an increase in her heart rate 5 days ago, which lasted ~8 hours. She was asymptomatic. Her heart rate has otherwise been normal (she checks it about 5 times per day). She denies LE edema, orthopnea, and PND. She is scheduled for a sleep study in early April. She has a chronic cough with clear mucous production, which has previously been attributed to GERD. She is on standing famotidine and feels like her GERD has improved but not completely resolved. She was advised not to take indefinite PPI therapy due to her renal disease.   --------------------------------------------------------------------------------------------------  Cardiovascular History & Procedures: Cardiovascular Problems:  Persistent atrial fibrillation  Risk Factors:  Hypertension, hyperlipidemia, and age greater than 45  Cath/PCI:  None  CV  Surgery:  None  EP Procedures and Devices:  None  Non-Invasive Evaluation(s):  TEE (02/24/16): LVEF depressed but difficult to assess. Trace AI. Moderate MR. Large LA without thrombus. Question small PFO. Normal RV size with depressed function. Mild TR.  TTE (02/18/16): Normal LV size and wall thickness with EF 50-55%. Normal wall motion. MAC with mild MR. Moderate LA enlargement. Mildly dilated RV with normal contraction. Mild TR. Mildly elevated PA pressure.  Stress test (15-20 years ago): Normal per patient's report.  Recent CV Pertinent Labs: Lab Results  Component Value Date   CHOL 125 02/18/2016   HDL 41 02/18/2016   LDLCALC 66 02/18/2016   TRIG 90 02/18/2016   CHOLHDL 3.0 02/18/2016   INR 0.99 02/17/2016   BNP 150.2 (H) 02/17/2016   K 4.1 03/19/2016   MG 1.7 02/17/2016   BUN 15 03/09/2016   CREATININE 1.30 (H) 03/09/2016    --------------------------------------------------------------------------------------------------  Past Medical History:  Diagnosis Date  . Anxiety   . Arthritis   . Cataracts, bilateral    immature  . CKD (chronic kidney disease) stage 3, GFR 30-59 ml/min   . Depression   . GERD (gastroesophageal reflux disease)    was on Nexium   . GERD (gastroesophageal reflux disease)   . Hemorrhoid   . History of bronchitis    couple of yrs ago  . History of colon polyps   . History of peristent atrial fibrillation   . Hyperlipidemia    takes Pravastatin daily  . Hypertension    takes Amlodipine and Diovan daily  . Hypertension   . Sleep apnea    no cpap used  . Sleep apnea    study done 20+yrs ago and doesn't use cpap  . Urinary urgency   . Weakness  in left leg r/t back    Past Surgical History:  Procedure Laterality Date  . ABDOMINAL HYSTERECTOMY  1980   partial  . ABDOMINAL HYSTERECTOMY     partial  . BACK SURGERY    . CARDIOVERSION N/A 02/24/2016   Procedure: CARDIOVERSION;  Surgeon: Fay Records, MD;  Location: [facility] ENDOSCOPY;   Service: Cardiovascular;  Laterality: N/A;  . CARDIOVERSION N/A 03/19/2016   Procedure: CARDIOVERSION;  Surgeon: Fay Records, MD;  Location: [place];  Service: Cardiovascular;  Laterality: N/A;  . CARPAL TUNNEL RELEASE    . CHOLECYSTECTOMY  1975  . CHOLECYSTECTOMY    . COLONOSCOPY    . COLONOSCOPY WITH PROPOFOL  12/29/2011   Procedure: COLONOSCOPY WITH PROPOFOL;  Surgeon: Lear Ng, MD;  Location: [facility] ENDOSCOPY;  Service: Endoscopy;  Laterality: N/A;  . COLONOSCOPY WITH PROPOFOL N/A 05/24/2014   Procedure: COLONOSCOPY WITH PROPOFOL;  Surgeon: Wilford Corner, MD;  Location: [facility] ENDOSCOPY;  Service: Endoscopy;  Laterality: N/A;  . ESOPHAGOGASTRODUODENOSCOPY    . FOOT SURGERY     left, right  . FRACTURE SURGERY     left leg-knee  . heel spurs removed Bilateral   . HOT HEMOSTASIS  12/29/2011   Procedure: HOT HEMOSTASIS (ARGON PLASMA COAGULATION/BICAP);  Surgeon: Lear Ng, MD;  Location: [facility];  Service: Endoscopy;  Laterality: N/A;  . HOT HEMOSTASIS N/A 05/24/2014   Procedure: HOT HEMOSTASIS (ARGON PLASMA COAGULATION/BICAP);  Surgeon: Wilford Corner, MD;  Location: [facility];  Service: Endoscopy;  Laterality: N/A;  . leg surgery d/t break Left   . LUMBAR LAMINECTOMY/DECOMPRESSION MICRODISCECTOMY Left 04/05/2013   Procedure: LUMBAR FOUR TO FIVE LUMBAR LAMINECTOMY/DECOMPRESSION MICRODISCECTOMY 1 LEVEL;  Surgeon: Eustace Moore, MD;  Location: [place] [facility];  Service: Neurosurgery;  Laterality: Left;  . NISSEN FUNDOPLICATION    . TEE WITHOUT CARDIOVERSION N/A 02/24/2016   Procedure: TRANSESOPHAGEAL ECHOCARDIOGRAM (TEE);  Surgeon: Fay Records, MD;  Location: [facility] ENDOSCOPY;  Service: Cardiovascular;  Laterality: N/A;    Outpatient Encounter Prescriptions as of 04/03/2016  Medication Sig  . acetaminophen (TYLENOL) 500 MG tablet Take 500 mg by mouth every 6 (six) hours as needed. For pain  . amiodarone (PACERONE) 200 MG tablet Take 1 tablet (200 mg total) by mouth  daily.  Marland Kitchen apixaban (ELIQUIS) 5 MG TABS tablet Take 1 tablet (5 mg total) by mouth 2 (two) times daily.  . Ascorbic Acid (VITAMIN C WITH ROSE HIPS) 1000 MG tablet Take 1,000 mg by mouth daily.  . budesonide-formoterol (SYMBICORT) 160-4.5 MCG/ACT inhaler Inhale 2 puffs into the lungs 2 (two) times daily.  Marland Kitchen buPROPion (WELLBUTRIN XL) 150 MG 24 hr tablet Take 150 mg by mouth every morning.  Marland Kitchen CALCIUM PO Take 1 tablet by mouth every morning.  . Cholecalciferol (VITAMIN D) 2000 UNITS tablet Take 4,000 Units by mouth daily.  . famotidine (PEPCID) 40 MG tablet Take 1 tablet (40 mg total) by mouth daily.  . furosemide (LASIX) 20 MG tablet Take 1 tablet (20 mg total) by mouth daily.  . metoprolol succinate (TOPROL XL) 25 MG 24 hr tablet Take 0.5 tablets (12.5 mg total) by mouth daily.  . potassium chloride SA (K-DUR,KLOR-CON) 20 MEQ tablet Take 1 tablet (20 mEq total) by mouth daily.  . pravastatin (PRAVACHOL) 40 MG tablet Take 40 mg by mouth at bedtime.   No facility-administered encounter medications on file as of 04/03/2016.     Allergies: Lexapro [escitalopram]; Peroxide [hydrogen peroxide]; Zoloft [sertraline hcl]; and Neosporin [neomycin-bacitracin zn-polymyx]  Social History  Social History  . Marital status: Married    Spouse name: N/A  . Number of children: N/A  . Years of education: N/A   Occupational History  . Not on file.   Social History Main Topics  . Smoking status: Never Smoker  . Smokeless tobacco: Never Used  . Alcohol use No  . Drug use: No  . Sexual activity: Yes    Birth control/ protection: Surgical   Other Topics Concern  . Not on file   Social History Narrative   ** Merged History Encounter **        Family History  Problem Relation Age of Onset  . Colon cancer Mother   . Heart disease Father     Review of Systems: Patient endorses excessive sweating and muscle/joint pain. Otherwise, a 12-system review of systems was performed and was negative except  as noted in the HPI.  --------------------------------------------------------------------------------------------------  Physical Exam: BP 138/78   Pulse (!) 57   Ht _0  (1.702 m)   Wt 229 lb 4 oz (104 kg)   SpO2 96%   BMI 35.91 kg/m   General:  Obese woman, seated comfortably in the exam room. HEENT: No conjunctival pallor or scleral icterus.  Moist mucous membranes.  OP clear. Neck: Supple without lymphadenopathy, thyromegaly, JVD, or HJR. Lungs: Normal work of breathing.  Clear to auscultation bilaterally without wheezes or crackles. Heart: Bradycardic but regular. No murmurs or rubs. Unable to assess PMI due to body habitus. Abd: Bowel sounds present.  Soft, NT/ND without hepatosplenomegaly Ext: No lower extremity edema.  2+ radial and pedal pulses bilaterally. Skin: warm and dry without rash  EKG:  Sinus bradycardia (HR 54 bpm) with low-voltage QRS.  Lab Results  Component Value Date   WBC 5.8 03/09/2016   HGB 14.3 03/19/2016   HCT 42.0 03/19/2016   MCV 93.8 03/09/2016   PLT 231 03/09/2016    Lab Results  Component Value Date   NA 142 03/19/2016   K 4.1 03/19/2016   CL 105 03/09/2016   CO2 27 03/09/2016   BUN 15 03/09/2016   CREATININE 1.30 (H) 03/09/2016   GLUCOSE 108 (H) 03/19/2016   ALT 10 (L) 02/17/2016    --------------------------------------------------------------------------------------------------  ASSESSMENT AND PLAN: Persistent atrial fibrillation Patient is in sinus rhythm today, though she notes a few hours of rapid heart rate earlier in the week. It is possible that she has occasional runs of atrial fibrillation. We will continue her current doses of amiodarone, metoprolol succinate, and apixaban.  Dyspnea on exertion This is likely multifactorial, including underlying lung disease, low normal LVEF, and valvular heart disease. We discussed switching to carvedilol from metoprolol succinate, but the patient wished to continue her current  regimen. I agree with upcoming sleep study and continued pulmonary follow-up. We discussed a myocardial perfusion stress test, given DOE and recent development of atrial fibrillation, but have agreed to defer this until after her sleep study.  Hypertension Blood pressure is upper normal today. We will not make any medication changes at this time.  GERD Symptoms improved with famotidine but not completely resolved. We will continue with famotidine, though I asked the patient to discuss a trial of PPI therapy (~1 month) to see if this relieves her symptoms.  Follow-up: Return to clinic in 3 months.  Nelva Bush, MD 04/03/2016 8:45 PM

## 2016-04-03 NOTE — Patient Instructions (Signed)
Medication Instructions:  Your physician recommends that you continue on your current medications as directed. Please refer to the Current Medication list given to you today.   Labwork: None  Testing/Procedures: None  Follow-Up: Your physician recommends that you schedule a follow-up appointment in: 3 months with Dr End.         If you need a refill on your cardiac medications before your next appointment, please call your pharmacy.   

## 2016-04-14 ENCOUNTER — Other Ambulatory Visit: Payer: Self-pay

## 2016-04-14 NOTE — Telephone Encounter (Signed)
Received refill request from [facility] on [address] for Pepcid 40mg . Per 02-14-16 OV, pt instructed to continue Pepcid. Rx has been sent to preferred pharmacy. Nothing further needed.

## 2016-04-20 ENCOUNTER — Ambulatory Visit (HOSPITAL_BASED_OUTPATIENT_CLINIC_OR_DEPARTMENT_OTHER): Payer: Medicare Other | Attending: Nurse Practitioner | Admitting: Cardiology

## 2016-04-20 DIAGNOSIS — G4733 Obstructive sleep apnea (adult) (pediatric): Secondary | ICD-10-CM | POA: Diagnosis not present

## 2016-04-20 DIAGNOSIS — I4891 Unspecified atrial fibrillation: Secondary | ICD-10-CM | POA: Diagnosis present

## 2016-04-21 NOTE — Procedures (Signed)
   Patient Name: [name], [name] Date: 04/20/2016 Gender: Female D.O.B: 1945/05/11 Age (years): 65 Referring Provider: Sherran Needs Height (inches): 24 Interpreting Physician: Fransico Him MD, ABSM Weight (lbs): 225 RPSGT: Joni Reining BMI: 35 MRN: [id number] Neck Size: 14.50  CLINICAL INFORMATION  Sleep Study Type: NPSG  Indication for sleep study: OSA, Snoring  Epworth Sleepiness Score: 4  SLEEP STUDY TECHNIQUE As per the AASM Manual for the Scoring of Sleep and Associated Events v2.3 (April 2016) with a hypopnea requiring 4% desaturations.  The channels recorded and monitored were frontal, central and occipital EEG, electrooculogram (EOG), submentalis EMG (chin), nasal and oral airflow, thoracic and abdominal wall motion, anterior tibialis EMG, snore microphone, electrocardiogram, and pulse oximetry.  MEDICATIONS Medications self-administered by patient taken the night of the study : AMIODARONE, APIXABAN, METOPROLOL, PREVASTATIN  SLEEP ARCHITECTURE The study was initiated at 10:02:04 PM and ended at 4:33:08 AM.  Sleep onset time was 5.5 minutes and the sleep efficiency was 72.8%. The total sleep time was 284.5 minutes.  Stage REM latency was 120.5 minutes.  The patient spent 8.61% of the night in stage N1 sleep, 57.30% in stage N2 sleep, 0.35% in stage N3 and 33.74% in REM.  Alpha intrusion was absent.  Supine sleep was 16.52%.  RESPIRATORY PARAMETERS The overall apnea/hypopnea index (AHI) was 12.7 per hour. There were 10 total apneas, including 10 obstructive, 0 central and 0 mixed apneas. There were 50 hypopneas and 6 RERAs.  The AHI during Stage REM sleep was 20.0 per hour.  AHI while supine was 24.3 per hour.  The mean oxygen saturation was 93.08%. The minimum SpO2 during sleep was 87.00%.  Moderate snoring was noted during this study.  CARDIAC DATA The 2 lead EKG demonstrated sinus rhythm. The mean heart rate was 57.35 beats per minute. Other  EKG findings include: None.  LEG MOVEMENT DATA The total PLMS were 112 with a resulting PLMS index of 23.62. Associated arousal with leg movement index was 1.3 .  IMPRESSIONS - Mild obstructive sleep apnea occurred during this study (AHI = 12.7/h). - No significant central sleep apnea occurred during this study (CAI = 0.0/h). - Mild oxygen desaturation was noted during this study (Min O2 = 87.00%). - The patient snored with Moderate snoring volume. - No cardiac abnormalities were noted during this study. - Mild periodic limb movements of sleep occurred during the study. No significant associated arousals.  DIAGNOSIS - Obstructive Sleep Apnea (327.23 [G47.33 ICD-10])  RECOMMENDATIONS - Therapeutic CPAP titration to determine optimal pressure required to alleviate sleep disordered breathing. - Positional therapy avoiding supine position during sleep. - Avoid alcohol, sedatives and other CNS depressants that may worsen sleep apnea and disrupt normal sleep architecture. - Sleep hygiene should be reviewed to assess factors that may improve sleep quality. - Weight management and regular exercise should be initiated or continued if appropriate.  [place], American Board of Sleep Medicine  ELECTRONICALLY SIGNED ON:  04/21/2016, 6:29 PM [place] PH: [phone]   FX: [phone] [place]

## 2016-04-24 ENCOUNTER — Telehealth: Payer: Self-pay | Admitting: *Deleted

## 2016-04-24 DIAGNOSIS — G4733 Obstructive sleep apnea (adult) (pediatric): Secondary | ICD-10-CM

## 2016-04-24 NOTE — Telephone Encounter (Signed)
-----   Message from Sueanne Margarita, MD sent at 04/21/2016  6:31 PM EDT ----- Please let patient know that they have sleep apnea and recommend CPAP titration. Please set up titration in the sleep lab.

## 2016-04-24 NOTE — Telephone Encounter (Addendum)
Informed patient of results and recommendations she verbalized understanding and agrees with the treatment.. She understands the order for the titration will be sent to the sleep lab. She understands a letter will be mailed out with the date and time of her test. She and says she has a cpap already but she does not wear it. Titration order placed in epic

## 2016-04-27 ENCOUNTER — Ambulatory Visit (INDEPENDENT_AMBULATORY_CARE_PROVIDER_SITE_OTHER): Payer: Medicare Other | Admitting: Pulmonary Disease

## 2016-04-27 ENCOUNTER — Encounter: Payer: Self-pay | Admitting: *Deleted

## 2016-04-27 ENCOUNTER — Encounter: Payer: Self-pay | Admitting: Pulmonary Disease

## 2016-04-27 VITALS — BP 126/78 | HR 67 | Ht 67.0 in | Wt 227.6 lb

## 2016-04-27 DIAGNOSIS — R059 Cough, unspecified: Secondary | ICD-10-CM

## 2016-04-27 DIAGNOSIS — R490 Dysphonia: Secondary | ICD-10-CM

## 2016-04-27 DIAGNOSIS — R05 Cough: Secondary | ICD-10-CM | POA: Diagnosis not present

## 2016-04-27 NOTE — Progress Notes (Signed)
[name] [name]    [id number]    November 10, 1945  Primary Care Physician:Cynthia Orest Dikes, MD  Referring Physician: Harlan Stains, MD [place] [place], [place] [address]  Chief complaint:  Chronic cough  HPI: [name] [name] is a 71 year old with past medical history of hypertension, GERD, hiatal hernia repair, hyperlipidemia, sleep apnea not on CPAP. She has complains of chronic cough for the past 8 weeks. Nonproductive in nature. Associated with occasional wheeze. She had been given prednisone for 2 weeks and a course of doxycycline without any change in symptoms. She started on Pepcid and chlorpheniramine by her primary care physician some improvement in symptoms. She was taking the chlorphentermine 5 mg 3 times a day but has reduced it to once a day. She is also on Symbicort inhaler.   She is a nonsmoker with no known exposures. She has history of GERD s/p hiatal hernia repair in the past.  Interim History: States that cough is unchanged since last visit. Has clear white mucus with cough. No dyspnea. Reports voice hoarseness.  Outpatient Encounter Prescriptions as of 04/27/2016  Medication Sig  . acetaminophen (TYLENOL) 500 MG tablet Take 500 mg by mouth every 6 (six) hours as needed. For pain  . amiodarone (PACERONE) 200 MG tablet Take 1 tablet (200 mg total) by mouth daily.  Marland Kitchen apixaban (ELIQUIS) 5 MG TABS tablet Take 1 tablet (5 mg total) by mouth 2 (two) times daily.  . Ascorbic Acid (VITAMIN C WITH ROSE HIPS) 1000 MG tablet Take 1,000 mg by mouth daily.  . budesonide-formoterol (SYMBICORT) 160-4.5 MCG/ACT inhaler Inhale 2 puffs into the lungs 2 (two) times daily.  Marland Kitchen buPROPion (WELLBUTRIN XL) 150 MG 24 hr tablet Take 150 mg by mouth every morning.  Marland Kitchen CALCIUM PO Take 1 tablet by mouth every morning.  . Cholecalciferol (VITAMIN D) 2000 UNITS tablet Take 4,000 Units by mouth daily.  Marland Kitchen esomeprazole (NEXIUM) 40 MG capsule Take 40 mg by mouth daily at 12 noon.  .  furosemide (LASIX) 20 MG tablet Take 1 tablet (20 mg total) by mouth daily.  . metoprolol succinate (TOPROL XL) 25 MG 24 hr tablet Take 0.5 tablets (12.5 mg total) by mouth daily.  . potassium chloride SA (K-DUR,KLOR-CON) 20 MEQ tablet Take 1 tablet (20 mEq total) by mouth daily.  . pravastatin (PRAVACHOL) 40 MG tablet Take 40 mg by mouth at bedtime.  . [DISCONTINUED] famotidine (PEPCID) 40 MG tablet Take 1 tablet (40 mg total) by mouth daily.  . cefUROXime (CEFTIN) 500 MG tablet    No facility-administered encounter medications on file as of 04/27/2016.     Allergies as of 04/27/2016 - Review Complete 04/27/2016  Allergen Reaction Noted  . Lexapro [escitalopram]  03/22/2013  . Peroxide [hydrogen peroxide] Other (See Comments) 05/02/2014  . Zoloft [sertraline hcl]  03/22/2013  . Neosporin [neomycin-bacitracin zn-polymyx] Rash 05/02/2014    Past Medical History:  Diagnosis Date  . Anxiety   . Arthritis   . Cataracts, bilateral    immature  . CKD (chronic kidney disease) stage 3, GFR 30-59 ml/min   . Depression   . GERD (gastroesophageal reflux disease)    was on Nexium   . GERD (gastroesophageal reflux disease)   . Hemorrhoid   . History of bronchitis    couple of yrs ago  . History of colon polyps   . History of peristent atrial fibrillation   . Hyperlipidemia    takes Pravastatin daily  .  Hypertension    takes Amlodipine and Diovan daily  . Hypertension   . Sleep apnea    no cpap used  . Sleep apnea    study done 20+yrs ago and doesn't use cpap  . Urinary urgency   . Weakness    in left leg r/t back    Past Surgical History:  Procedure Laterality Date  . ABDOMINAL HYSTERECTOMY  1980   partial  . ABDOMINAL HYSTERECTOMY     partial  . BACK SURGERY    . CARDIOVERSION N/A 02/24/2016   Procedure: CARDIOVERSION;  Surgeon: Fay Records, MD;  Location: [facility] ENDOSCOPY;  Service: Cardiovascular;  Laterality: N/A;  . CARDIOVERSION N/A 03/19/2016   Procedure: CARDIOVERSION;   Surgeon: Fay Records, MD;  Location: [place];  Service: Cardiovascular;  Laterality: N/A;  . CARPAL TUNNEL RELEASE    . CHOLECYSTECTOMY  1975  . CHOLECYSTECTOMY    . COLONOSCOPY    . COLONOSCOPY WITH PROPOFOL  12/29/2011   Procedure: COLONOSCOPY WITH PROPOFOL;  Surgeon: Lear Ng, MD;  Location: [facility] ENDOSCOPY;  Service: Endoscopy;  Laterality: N/A;  . COLONOSCOPY WITH PROPOFOL N/A 05/24/2014   Procedure: COLONOSCOPY WITH PROPOFOL;  Surgeon: Wilford Corner, MD;  Location: [facility] ENDOSCOPY;  Service: Endoscopy;  Laterality: N/A;  . ESOPHAGOGASTRODUODENOSCOPY    . FOOT SURGERY     left, right  . FRACTURE SURGERY     left leg-knee  . heel spurs removed Bilateral   . HOT HEMOSTASIS  12/29/2011   Procedure: HOT HEMOSTASIS (ARGON PLASMA COAGULATION/BICAP);  Surgeon: Lear Ng, MD;  Location: [facility]OS[facility];  Service: Endoscopy;  Laterality: N/A;  . HOT HEMOSTASIS N/A 05/24/2014   Procedure: HOT HEMOSTASIS (ARGON PLASMA COAGULATION/BICAP);  Surgeon: Wilford Corner, MD;  Location: [facility] ENDOSCOPY;  Service: Endoscopy;  Laterality: N/A;  . leg surgery d/t break Left   . LUMBAR LAMINECTOMY/DECOMPRESSION MICRODISCECTOMY Left 04/05/2013   Procedure: LUMBAR FOUR TO FIVE LUMBAR LAMINECTOMY/DECOMPRESSION MICRODISCECTOMY 1 LEVEL;  Surgeon: Eustace Moore, MD;  Location: [facility];  Service: Neurosurgery;  Laterality: Left;  . NISSEN FUNDOPLICATION    . TEE WITHOUT CARDIOVERSION N/A 02/24/2016   Procedure: TRANSESOPHAGEAL ECHOCARDIOGRAM (TEE);  Surgeon: Fay Records, MD;  Location: [facility] ENDOSCOPY;  Service: Cardiovascular;  Laterality: N/A;    Family History  Problem Relation Age of Onset  . Colon cancer Mother   . Heart disease Father     Social History   Social History  . Marital status: Married    Spouse name: N/A  . Number of children: N/A  . Years of education: N/A   Occupational History  . Not on file.   Social History Main Topics  . Smoking status: Never Smoker  .  Smokeless tobacco: Never Used  . Alcohol use No  . Drug use: No  . Sexual activity: Yes    Birth control/ protection: Surgical   Other Topics Concern  . Not on file   Social History Narrative   ** Merged History Encounter **       Review of systems: Review of Systems  Constitutional: Negative for fever and chills.  HENT: Negative.   Eyes: Negative for blurred vision.  Respiratory: as per HPI  Cardiovascular: Negative for chest pain and palpitations.  Gastrointestinal: Negative for vomiting, diarrhea, blood per rectum. Genitourinary: Negative for dysuria, urgency, frequency and hematuria.  Musculoskeletal: Negative for myalgias, back pain and joint pain.  Skin: Negative for itching and rash.  Neurological: Negative for dizziness, tremors, focal weakness, seizures  and loss of consciousness.  Endo/Heme/Allergies: Negative for environmental allergies.  Psychiatric/Behavioral: Negative for depression, suicidal ideas and hallucinations.  All other systems reviewed and are negative.  Physical Exam: Blood pressure 126/78, pulse 67, height 5\' 7"  (1.702 m), weight 227 lb 9.6 oz (103.2 kg), SpO2 96 %. Gen:      No acute distress HEENT:  EOMI, sclera anicteric Neck:     No masses; no thyromegaly Lungs:    Clear to auscultation bilaterally; normal respiratory effort CV:         Regular rate and rhythm; no murmurs Abd:      + bowel sounds; soft, non-tender; no palpable masses, no distension Ext:    No edema; adequate peripheral perfusion Skin:      Warm and dry; no rash Neuro: alert and oriented x 3 Psych: normal mood and affect  Data Reviewed: CXR 01/17/16-mild enlargement of the heart. No active pulmonary process. Images reviewed.  FENO 02/14/16- 17  PFTs 02/21/16  FVC 3.37 (61%) FEV1 2.56 (62%] F/F 76 TLC 96% DLCO 82% Minimal obstructive disease. Albuterol not given due to atrial fibrillation  Assessment:  Chronic cough. The cough is likely postinfectious after what looks like  a viral bronchitis episode. Her symptoms are exacerbated by acid reflux and probable postnasal drip. I explained to her that cough from damaged epithelium is very hard to resolve with ongoing insult from GERD and postnasal drip. She will continue on the nexium for acid reflux, chlorpheniramine, flonase for post nasal drip. We will send her to ENT for eval of hoarseness. If symptoms are persistent then she may need a GI eval for reflux  I reeducated her on behavioral changes to deal with cough including conscious suppression of the urge to cough, use of throat lozenges.  She has low FENO in the office today arguing against asthma. She'll continue on the Symbicort for now an.  Plan/Recommendations: - Continue symbicort - Continue chlorpheniramine, flonase - Continue Nexium - ENT referral  Marshell Garfinkel MD [facility] Pager [phone] 04/27/2016, 4:11 PM  CC: [name] Stains, MD

## 2016-04-27 NOTE — Patient Instructions (Signed)
We will refer you to ENT for further evaluation of cough, hoarseness continue using the Symbicort Continue using the Pepcid  Return to clinic in 1-2 months

## 2016-05-13 ENCOUNTER — Other Ambulatory Visit: Payer: Self-pay | Admitting: Gastroenterology

## 2016-05-14 ENCOUNTER — Telehealth: Payer: Self-pay | Admitting: Pharmacist

## 2016-05-14 NOTE — Telephone Encounter (Signed)
Clearance form received from [facility] with request to hold Eliquis prior to endoscopy on 6/12. Pt takes Eliquis for afib with CHADS2 score of 3 (HTN, age, DM), no hx of stroke. Ok to hold for 1-2 days prior as needed. Clearance faxed back to Dr Michail Sermon with [facility] [phone].

## 2016-05-17 ENCOUNTER — Other Ambulatory Visit: Payer: Self-pay | Admitting: Physician Assistant

## 2016-05-19 DIAGNOSIS — R05 Cough: Secondary | ICD-10-CM | POA: Insufficient documentation

## 2016-05-19 DIAGNOSIS — R059 Cough, unspecified: Secondary | ICD-10-CM | POA: Insufficient documentation

## 2016-06-03 ENCOUNTER — Other Ambulatory Visit (HOSPITAL_COMMUNITY): Payer: Self-pay | Admitting: Nurse Practitioner

## 2016-06-10 ENCOUNTER — Ambulatory Visit (HOSPITAL_COMMUNITY): Admission: RE | Admit: 2016-06-10 | Payer: Medicare Other | Source: Ambulatory Visit | Admitting: Gastroenterology

## 2016-06-10 ENCOUNTER — Encounter (HOSPITAL_COMMUNITY): Admission: RE | Payer: Self-pay | Source: Ambulatory Visit

## 2016-06-10 SURGERY — MANOMETRY, ESOPHAGUS

## 2016-06-11 ENCOUNTER — Ambulatory Visit: Payer: Medicare Other | Admitting: Pulmonary Disease

## 2016-06-15 NOTE — Anesthesia Postprocedure Evaluation (Signed)
Anesthesia Post Note  Patient: [name] [name]  Procedure(s) Performed: Procedure(s) (LRB): CARDIOVERSION (N/A)     Anesthesia Post Evaluation  Last Vitals:  Vitals:   03/19/16 1300 03/19/16 1325  BP: (!) 97/54 (!) 107/47  Pulse: (!) 59 (!) 58  Resp: 17 17  Temp:      Last Pain:  Vitals:   03/19/16 1256  TempSrc: Oral                 Randy Castrejon S

## 2016-06-15 NOTE — Addendum Note (Signed)
Addendum  created 06/15/16 1209 by Myrtie Soman, MD   Sign clinical note

## 2016-06-18 ENCOUNTER — Encounter (HOSPITAL_BASED_OUTPATIENT_CLINIC_OR_DEPARTMENT_OTHER): Payer: Medicare Other

## 2016-07-05 NOTE — Progress Notes (Signed)
Follow-up Outpatient Visit Date: 07/06/2016  Primary Care Provider: Harlan Stains, MD [place] [address]  Chief Complaint: Follow-up atrial fibrillation  HPI:  [name] [name] is a 71 y.o. year-old female with history of persistent atrial fibrillation, chronic kidney disease stage III, hypertension, hyperlipidemia, sleep apnea, and borderline diabetes, who presents for follow-up of atrial fibrillation. I last saw her on 04/03/16 following hospitalization for atrial fibrillation with rapid ventricular response. She underwent amiodarone loading and TEE-guided DCCV during her initial hospitalization in early March. However, she had reverted back to atrial fibrillation at her follow-up appointment in the a-fib clinic 1 week later. Following additional amiodarone loading, she underwent repeat DCCV with maintenance of sinus rhythm at our last visit. At our last visit, she endorsed stable exertional dyspnea but otherwise felt well. She underwent sleep study after our last visit, which revealed mild sleep apnea. She did not return for CPAP titration due to cost issues.  Today, [name] [name] reports feeling well. Her cough has resolved. She continues to have some fatigue as well as a "funny" feeling in her chest. She describes it as a vague burning that lasts a few seconds and is not related to any particular activity. It happens only once every 2-3 weeks. She is not had any shortness of breath, palpitations, lightheadedness, orthopnea, PND, or edema. She notes that her home blood pressures are typically 120-140/60-80. She remains on apixaban without bleeding.  -------------------------------------------------------------------------------------------------- Cardiovascular History & Procedures: Cardiovascular Problems:  Persistent atrial fibrillation  Risk Factors:  Hypertension, hyperlipidemia, and age greater than 61  Cath/PCI:  None  CV Surgery:  None  EP  Procedures and Devices:  None  Non-Invasive Evaluation(s):  TEE (02/24/16): LVEF depressed but difficult to assess. Trace AI. Moderate MR. Large LA without thrombus. Question small PFO. Normal RV size with depressed function. Mild TR.  TTE (02/18/16): Normal LV size and wall thickness with EF 50-55%. Normal wall motion. MAC with mild MR. Moderate LA enlargement. Mildly dilated RV with normal contraction. Mild TR. Mildly elevated PA pressure.  Stress test (15-20 years ago): Normal per patient's report.  Recent CV Pertinent Labs: Lab Results  Component Value Date   CHOL 125 02/18/2016   HDL 41 02/18/2016   LDLCALC 66 02/18/2016   TRIG 90 02/18/2016   CHOLHDL 3.0 02/18/2016   INR 0.99 02/17/2016   BNP 150.2 (H) 02/17/2016   K 4.1 03/19/2016   MG 1.7 02/17/2016   BUN 15 03/09/2016   CREATININE 1.30 (H) 03/09/2016    Past medical and surgical history were reviewed and updated in EPIC.  Current Meds  Medication Sig  . acetaminophen (TYLENOL) 500 MG tablet Take 500 mg by mouth every 6 (six) hours as needed. For pain  . amiodarone (PACERONE) 200 MG tablet Take 1 tablet (200 mg total) by mouth daily.  Marland Kitchen apixaban (ELIQUIS) 5 MG TABS tablet Take 1 tablet (5 mg total) by mouth 2 (two) times daily.  . Ascorbic Acid (VITAMIN C WITH ROSE HIPS) 1000 MG tablet Take 1,000 mg by mouth daily.  . Cholecalciferol (VITAMIN D) 2000 UNITS tablet Take 4,000 Units by mouth daily.  . furosemide (LASIX) 20 MG tablet TAKE 1 TABLET BY MOUTH ONCE DAILY  . metoprolol succinate (TOPROL XL) 25 MG 24 hr tablet Take 0.5 tablets (12.5 mg total) by mouth daily.  . potassium chloride SA (K-DUR,KLOR-CON) 20 MEQ tablet Take 1 tablet (20 mEq total) by mouth daily.  . pravastatin (PRAVACHOL) 40 MG tablet Take  40 mg by mouth at bedtime.    Allergies: Lexapro [escitalopram]; Peroxide [hydrogen peroxide]; Zoloft [sertraline hcl]; and Neosporin [neomycin-bacitracin zn-polymyx]  Social History   Social History  .  Marital status: Married    Spouse name: N/A  . Number of children: N/A  . Years of education: N/A   Occupational History  . Not on file.   Social History Main Topics  . Smoking status: Never Smoker  . Smokeless tobacco: Never Used  . Alcohol use No  . Drug use: No  . Sexual activity: Yes    Birth control/ protection: Surgical   Other Topics Concern  . Not on file   Social History Narrative   ** Merged History Encounter **        Family History  Problem Relation Age of Onset  . Colon cancer Mother   . Heart disease Father     Review of Systems: A 12-system review of systems was performed and was negative except as noted in the HPI.  --------------------------------------------------------------------------------------------------  Physical Exam: BP (!) 144/82 (BP Location: Left Arm, Patient Position: Sitting, Cuff Size: Large)   Pulse (!) 55   Ht _0  (1.702 m)   Wt 231 lb 4 oz (104.9 kg)   BMI 36.22 kg/m   General:  Obese woman, seated comfortably in the exam room. She is accompanied by her husband. HEENT: No conjunctival pallor or scleral icterus. Moist mucous membranes.  OP clear. Neck: Supple without lymphadenopathy, thyromegaly, JVD, or HJR. No carotid bruit. Lungs: Normal work of breathing. Clear to auscultation bilaterally without wheezes or crackles. Heart: Bradycardic but regular without murmurs, rubs, or gallops. Non-displaced PMI. Abd: Bowel sounds present. Soft, NT/ND without hepatosplenomegaly Ext: No lower extremity edema. Radial, PT, and DP pulses are 2+ bilaterally. Skin: Warm and dry without rash.  EKG:  Sinus bradycardia (heart rate 55 bpm) without significant abnormalities.  Lab Results  Component Value Date   WBC 5.8 03/09/2016   HGB 14.3 03/19/2016   HCT 42.0 03/19/2016   MCV 93.8 03/09/2016   PLT 231 03/09/2016    Lab Results  Component Value Date   NA 142 03/19/2016   K 4.1 03/19/2016   CL 105 03/09/2016   CO2 27 03/09/2016    BUN 15 03/09/2016   CREATININE 1.30 (H) 03/09/2016   GLUCOSE 108 (H) 03/19/2016   ALT 10 (L) 02/17/2016    Lab Results  Component Value Date   CHOL 125 02/18/2016   HDL 41 02/18/2016   LDLCALC 66 02/18/2016   TRIG 90 02/18/2016   CHOLHDL 3.0 02/18/2016    --------------------------------------------------------------------------------------------------  ASSESSMENT AND PLAN: Persistent atrial fibrillation Ms. Ketcherside remains in sinus rhythm today on amiodarone and apixaban. We will plan to continue these medications and definitely. We will repeat LFTs and TSH at her follow-up in 3 months. Plan for repeat PFTs next winter.  Heart failure with preserved ejection fraction Ms. Rocca appears euvolemic with NYHA class II symptoms. We will continue her current medications, including metoprolol and furosemide. I will check a basic metabolic panel today to ensure stable renal function and electrolytes.  Fatigue and chest pain This is multifactorial and may be related to excessive beta-blockade, though Ms. Ferch is only on metoprolol succinate 12.5 mg daily. Given her cardiac risk factors and atypical chest pain, we have agreed to obtain a pharmacologic myocardial perfusion stress test to exclude ischemia. We have agreed to defer discontinuation of metoprolol until after the stress test.  Hypertension Blood pressure is mildly elevated  today but is normal to borderline elevated on home readings. We will not make any medication changes today.  Follow-up: Return to clinic in 3 months.  Nelva Bush, MD 07/06/2016 8:19 AM

## 2016-07-06 ENCOUNTER — Ambulatory Visit (INDEPENDENT_AMBULATORY_CARE_PROVIDER_SITE_OTHER): Payer: Medicare Other | Admitting: Internal Medicine

## 2016-07-06 ENCOUNTER — Encounter: Payer: Self-pay | Admitting: Internal Medicine

## 2016-07-06 ENCOUNTER — Telehealth (HOSPITAL_COMMUNITY): Payer: Self-pay | Admitting: *Deleted

## 2016-07-06 ENCOUNTER — Telehealth: Payer: Self-pay

## 2016-07-06 VITALS — BP 144/82 | HR 55 | Ht 67.0 in | Wt 231.2 lb

## 2016-07-06 DIAGNOSIS — I4819 Other persistent atrial fibrillation: Secondary | ICD-10-CM

## 2016-07-06 DIAGNOSIS — R079 Chest pain, unspecified: Secondary | ICD-10-CM | POA: Diagnosis not present

## 2016-07-06 DIAGNOSIS — I481 Persistent atrial fibrillation: Secondary | ICD-10-CM | POA: Diagnosis not present

## 2016-07-06 DIAGNOSIS — R5383 Other fatigue: Secondary | ICD-10-CM | POA: Diagnosis not present

## 2016-07-06 DIAGNOSIS — I5030 Unspecified diastolic (congestive) heart failure: Secondary | ICD-10-CM | POA: Insufficient documentation

## 2016-07-06 DIAGNOSIS — I10 Essential (primary) hypertension: Secondary | ICD-10-CM

## 2016-07-06 HISTORY — DX: Chest pain, unspecified: R07.9

## 2016-07-06 HISTORY — DX: Other fatigue: R53.83

## 2016-07-06 HISTORY — DX: Unspecified diastolic (congestive) heart failure: I50.30

## 2016-07-06 LAB — BASIC METABOLIC PANEL
BUN / CREAT RATIO: 12 (ref 12–28)
BUN: 15 mg/dL (ref 8–27)
CO2: 27 mmol/L (ref 20–29)
CREATININE: 1.29 mg/dL — AB (ref 0.57–1.00)
Calcium: 9.3 mg/dL (ref 8.7–10.3)
Chloride: 104 mmol/L (ref 96–106)
GFR calc non Af Amer: 42 mL/min/{1.73_m2} — ABNORMAL LOW (ref >59)
GFR, EST AFRICAN AMERICAN: 48 mL/min/{1.73_m2} — AB (ref >59)
GLUCOSE: 104 mg/dL — AB (ref 65–99)
Potassium: 4.1 mmol/L (ref 3.5–5.2)
SODIUM: 143 mmol/L (ref 134–144)

## 2016-07-06 NOTE — Telephone Encounter (Signed)
Left message on voicemail per DPR in reference to upcoming appointment scheduled on 07/07/16 at 1245 with detailed instructions given per Myocardial Perfusion Study Information Sheet for the test. LM to arrive 15 minutes early, and that it is imperative to arrive on time for appointment to keep from having the test rescheduled. If you need to cancel or reschedule your appointment, please call the office within 24 hours of your appointment. Failure to do so may result in a cancellation of your appointment, and a $50 no show fee. Phone number given for call back for any questions.

## 2016-07-06 NOTE — Telephone Encounter (Signed)
**Note De-Identified [name] [name] Obfuscation** Pt assistance application for Eliquis has been faxed to [employer]. Awaiting response.

## 2016-07-06 NOTE — Patient Instructions (Signed)
Medication Instructions:  Your physician recommends that you continue on your current medications as directed. Please refer to the Current Medication list given to you today.   Labwork: BMET today  Testing/Procedures: Your physician has requested that you have a lexiscan myoview. For further information please visit [email or web address]. Please follow instruction sheet, as given.    Follow-Up: Your physician recommends that you schedule a follow-up appointment in: 3 months with Dr End        If you need a refill on your cardiac medications before your next appointment, please call your pharmacy.

## 2016-07-07 ENCOUNTER — Ambulatory Visit (HOSPITAL_COMMUNITY): Payer: Medicare Other | Attending: Cardiology

## 2016-07-07 DIAGNOSIS — R079 Chest pain, unspecified: Secondary | ICD-10-CM | POA: Diagnosis not present

## 2016-07-07 DIAGNOSIS — E109 Type 1 diabetes mellitus without complications: Secondary | ICD-10-CM | POA: Insufficient documentation

## 2016-07-07 DIAGNOSIS — I4891 Unspecified atrial fibrillation: Secondary | ICD-10-CM | POA: Diagnosis not present

## 2016-07-07 DIAGNOSIS — I481 Persistent atrial fibrillation: Secondary | ICD-10-CM | POA: Diagnosis not present

## 2016-07-07 DIAGNOSIS — R0609 Other forms of dyspnea: Secondary | ICD-10-CM | POA: Insufficient documentation

## 2016-07-07 DIAGNOSIS — R0789 Other chest pain: Secondary | ICD-10-CM | POA: Diagnosis not present

## 2016-07-07 DIAGNOSIS — R5383 Other fatigue: Secondary | ICD-10-CM

## 2016-07-07 DIAGNOSIS — I4819 Other persistent atrial fibrillation: Secondary | ICD-10-CM

## 2016-07-07 MED ORDER — REGADENOSON 0.4 MG/5ML IV SOLN
0.4000 mg | Freq: Once | INTRAVENOUS | Status: AC
  Administered 2016-07-07: 0.4 mg via INTRAVENOUS

## 2016-07-07 MED ORDER — TECHNETIUM TC 99M TETROFOSMIN IV KIT
32.8000 | PACK | Freq: Once | INTRAVENOUS | Status: AC | PRN
  Administered 2016-07-07: 32.8 via INTRAVENOUS
  Filled 2016-07-07: qty 33

## 2016-07-10 ENCOUNTER — Ambulatory Visit (HOSPITAL_COMMUNITY): Payer: Medicare Other | Attending: Cardiology

## 2016-07-10 LAB — MYOCARDIAL PERFUSION IMAGING
LVDIAVOL: 94 mL (ref 46–106)
LVSYSVOL: 34 mL
Peak HR: 75 {beats}/min
RATE: 0.34
Rest HR: 55 {beats}/min
SDS: 2
SRS: 1
SSS: 3
TID: 0.97

## 2016-07-10 MED ORDER — TECHNETIUM TC 99M TETROFOSMIN IV KIT
31.8000 | PACK | Freq: Once | INTRAVENOUS | Status: AC | PRN
  Administered 2016-07-10: 31.8 via INTRAVENOUS
  Filled 2016-07-10: qty 32

## 2016-07-10 NOTE — Telephone Encounter (Signed)
Patient in office having test completed, received request from husband for lab results and status of Eliquis assistance.     Spoke to husband (ok per DPR)-aware of results and aware patient spoke to nurse in regards.    Also aware assistance application may take up to a month to be approved, also there is a # on assistance forms to call for update.    Verbalized understanding.

## 2016-07-14 ENCOUNTER — Telehealth: Payer: Self-pay | Admitting: Internal Medicine

## 2016-07-14 DIAGNOSIS — E785 Hyperlipidemia, unspecified: Secondary | ICD-10-CM

## 2016-07-14 NOTE — Telephone Encounter (Signed)
Pt aware of normal stress test and recommendations per Dr. Saunders Revel. Advised the pt that if she continues feeling fatigued, then she should notify our office, so we can advise on holding her beta blocker to see if symptom subside.  Per the pt, she states she feels good today, no fatigue, and she will keep [place] updated if this reoccurs.  Pt verbalized understanding and agrees with this plan.      Notes recorded by Nelva Bush, MD on 07/12/2016 at 3:07 PM EDT Please let Ms. Rockhill know that her stress test is normal without evidence of a significant blockage. If she continues to have fatigue, she can try holding metoprolol to see if this helps her feel better.

## 2016-07-14 NOTE — Telephone Encounter (Signed)
New Message     Pt calling to get  Her stress test results

## 2016-07-31 NOTE — Telephone Encounter (Signed)
**Note De-Identified [name] Obfuscation** I received a denial letter [name] [name] fax on 7//19. Then I received an approval letter [name] [name] fax today stating that the pt has been approved to receive Eliquis free of charge from [employer]. Approval is good from 07/30/2016 until 01/11/2017.

## 2016-09-08 ENCOUNTER — Telehealth: Payer: Self-pay | Admitting: Internal Medicine

## 2016-09-08 ENCOUNTER — Telehealth: Payer: Self-pay | Admitting: *Deleted

## 2016-09-08 NOTE — Telephone Encounter (Signed)
Requesting surgical clearance:   1. Type of surgery: Left total knee replacement   2. Surgeon: Dr Kathryne Hitch  3. Surgical date: pending (patient wants to schedule ASAP)  4. Medications that need to be held: Eliquis  5. I will defer to: Dr End   [facility] Phone [phone] ext. [place] Fax [phone]  Attn: Venida Jarvis

## 2016-09-08 NOTE — Telephone Encounter (Signed)
Pt aware Dr End has received surgical clearance request from Dr Percell Miller, I will let her know once Dr End has reviewed clearance request.

## 2016-09-08 NOTE — Telephone Encounter (Signed)
New message     Pt needs you to send the surgical clearance over to Dr Percell Miller office by end of today so that she can have surgery, before end of September.

## 2016-09-08 NOTE — Telephone Encounter (Signed)
LMTCB for pt 

## 2016-09-09 ENCOUNTER — Other Ambulatory Visit: Payer: Self-pay | Admitting: Family Medicine

## 2016-09-09 DIAGNOSIS — Z1231 Encounter for screening mammogram for malignant neoplasm of breast: Secondary | ICD-10-CM

## 2016-09-09 NOTE — Telephone Encounter (Signed)
Called patient and she denies any new symptoms since her last visit. She verbalized understanding to stop Eliquis 2 days prior to surgery and to restart when advised by Dr Percell Miller. Clearance routed to [facility] via [employer].

## 2016-09-09 NOTE — Telephone Encounter (Signed)
Unless new symptoms have developed since her last visit, I think it is fine for [name] to proceed with left knee replacement without additional cardiac testing. She should hold apixaban for 2 days prior to the surgery and restart the medication as soon as it is felt safe to do so by Dr. Percell Miller.  Nelva Bush, MD [facility] Pager: [phone]

## 2016-09-10 NOTE — Telephone Encounter (Signed)
This has been done, see surgical clearance phone note 09/08/16.

## 2016-09-21 DIAGNOSIS — M1712 Unilateral primary osteoarthritis, left knee: Secondary | ICD-10-CM

## 2016-09-21 HISTORY — DX: Unilateral primary osteoarthritis, left knee: M17.12

## 2016-09-21 NOTE — H&P (Signed)
[name] [name] comes in for follow up.  Seen recently for degenerative arthritis in both knees.  Injection we did on the left really didn't help more than just a couple of days.  Injection of the right knee done in December of 2016 helped for a number of months.  She knows what is going on on the left and what needs to be done.  On the right she is coming in to see if we can re-inject that today and buy a little bit more time.  Symptoms on the left are definitely worse.  Pain mostly medial.  Increasing varus deformity, left greater than right.  Denies back issues or hip issues.  Previous evaluation reviewed in my notes from July 30, 2015.  She is a diabetic.  Her last Hemoglobin A1C was in the 8 range, as she recalls.  We reviewed all of her medications.    EXAMINATION: General exam is outlined.  Specifically, 71 years old.  Height: 5?7.  Weight: 230 pounds.  Increasing varus with tibiofemoral and patellofemoral crepitus, left greater than right.  She also has patellofemoral crepitus, both sides.  She can still get to full extension, better than 90 degrees on the left and a little bit better than 100 on the right.  Tender medial compartment both sides.  Neurovascularly intact distally.  Negative log roll of both hips.    X-RAYS: X-rays are obtained, bone on bone medial compartment on the left, almost there on the right.  Tricompartmental changes.  Increasing varus of about 5 degrees on the left, a little bit less on the right.    DISPOSITION:  The only thing that is going to help her left knee is a total knee replacement and she understands that.  She would like to proceed with that.  That procedure, risks, benefits and complications reviewed.  Paperwork complete.  All questions answered.  We will see her prior to operative intervention.   In regards to the right knee, we are going to inject that today and see how much we can help her with that.  The end of the day that knee is also going to come down to knee  replacement.  MRI or discussion of scope on the right I think would be completely futile.  I went over that with her and she understands.    PROCEDURE NOTE: The patient's clinical condition is marked by substantial pain and/or significant functional disability.  Other conservative therapy has not provided relief, is contraindicated, or not appropriate.  There is a reasonable likelihood that injection will significantly improve the patient's pain and/or functional disability. After appropriate consent and under sterile technique intraarticular injection of the right knee with 80 mg of Depo-Medrol and Marcaine.  Tolerated this well.

## 2016-09-23 ENCOUNTER — Ambulatory Visit
Admission: RE | Admit: 2016-09-23 | Discharge: 2016-09-23 | Disposition: A | Discharge status: Home / Self Care | Payer: Medicare Other | Source: Ambulatory Visit | Attending: Family Medicine | Admitting: Family Medicine

## 2016-09-23 DIAGNOSIS — Z1231 Encounter for screening mammogram for malignant neoplasm of breast: Secondary | ICD-10-CM

## 2016-09-24 NOTE — Pre-Procedure Instructions (Signed)
[name] [name]  09/24/2016      [place], [place] [address] [place] [address] Phone: [phone] Fax: [phone]    Your procedure is scheduled on September 26  Report to [facility] Admitting at 1:30 p.M.  Call this number if you have problems the morning of surgery:  [phone]   Remember:  Do not eat food or drink liquids after midnight.  Continue all other medications as directed by your physician except follow these medication instructions before surgery    Take these medicines the morning of surgery with A SIP OF WATER  acetaminophen (TYLENOL) amiodarone (PACERONE)  buPROPion (WELLBUTRIN XL) fexofenadine (ALLEGRA) metoprolol succinate (TOPROL XL)  7 days prior to surgery STOP taking any Aspirin, Aleve, Naproxen, Ibuprofen, Motrin, Advil, Goody's, BC's, all herbal medications, fish oil, and all vitamins  FOLLOW PHYSICIANS INSTRUCTIONS ABOUT apixaban (ELIQUIS)    Do not wear jewelry, make-up or nail polish.  Do not wear lotions, powders, or perfumes, or deoderant.  Do not shave 48 hours prior to surgery.  Men may shave face and neck.  Do not bring valuables to the hospital.  [facility] is not responsible for any belongings or valuables.  Contacts, dentures or bridgework may not be worn into surgery.  Leave your suitcase in the car.  After surgery it may be brought to your room.  For patients admitted to the hospital, discharge time will be determined by your treatment team.  Patients discharged the day of surgery will not be allowed to drive home.    Special instructions:   [place]- Preparing For Surgery  Before surgery, you can play an important role. Because skin is not sterile, your skin needs to be as free of germs as possible. You can reduce the number of germs on your skin by washing with CHG (chlorahexidine gluconate) Soap before surgery.  CHG is an antiseptic cleaner  which kills germs and bonds with the skin to continue killing germs even after washing.  Please do not use if you have an allergy to CHG or antibacterial soaps. If your skin becomes reddened/irritated stop using the CHG.  Do not shave (including legs and underarms) for at least 48 hours prior to first CHG shower. It is OK to shave your face.  Please follow these instructions carefully.   1. Shower the NIGHT BEFORE SURGERY and the MORNING OF SURGERY with CHG.   2. If you chose to wash your hair, wash your hair first as usual with your normal shampoo.  3. After you shampoo, rinse your hair and body thoroughly to remove the shampoo.  4. Use CHG as you would any other liquid soap. You can apply CHG directly to the skin and wash gently with a scrungie or a clean washcloth.   5. Apply the CHG Soap to your body ONLY FROM THE NECK DOWN.  Do not use on open wounds or open sores. Avoid contact with your eyes, ears, mouth and genitals (private parts). Wash genitals (private parts) with your normal soap.  6. Wash thoroughly, paying special attention to the area where your surgery will be performed.  7. Thoroughly rinse your body with warm water from the neck down.  8. DO NOT shower/wash with your normal soap after using and rinsing off the CHG Soap.  9. Pat yourself dry with a CLEAN TOWEL.   10. Wear CLEAN PAJAMAS   11. Place CLEAN SHEETS on your bed the  night of your first shower and DO NOT SLEEP WITH PETS.    Day of Surgery: Do not apply any deodorants/lotions. Please wear clean clothes to the hospital/surgery center.      Please read over the following fact sheets that you were given.

## 2016-09-25 ENCOUNTER — Encounter (HOSPITAL_COMMUNITY): Payer: Self-pay

## 2016-09-25 ENCOUNTER — Encounter (HOSPITAL_COMMUNITY)
Admission: RE | Admit: 2016-09-25 | Discharge: 2016-09-25 | Disposition: A | Discharge status: Home / Self Care | Payer: Medicare Other | Source: Ambulatory Visit | Attending: Orthopedic Surgery | Admitting: Orthopedic Surgery

## 2016-09-25 DIAGNOSIS — E785 Hyperlipidemia, unspecified: Secondary | ICD-10-CM | POA: Diagnosis not present

## 2016-09-25 DIAGNOSIS — K219 Gastro-esophageal reflux disease without esophagitis: Secondary | ICD-10-CM | POA: Diagnosis not present

## 2016-09-25 DIAGNOSIS — N183 Chronic kidney disease, stage 3 (moderate): Secondary | ICD-10-CM | POA: Insufficient documentation

## 2016-09-25 DIAGNOSIS — Z01812 Encounter for preprocedural laboratory examination: Secondary | ICD-10-CM | POA: Insufficient documentation

## 2016-09-25 DIAGNOSIS — I481 Persistent atrial fibrillation: Secondary | ICD-10-CM | POA: Diagnosis not present

## 2016-09-25 DIAGNOSIS — I129 Hypertensive chronic kidney disease with stage 1 through stage 4 chronic kidney disease, or unspecified chronic kidney disease: Secondary | ICD-10-CM | POA: Diagnosis not present

## 2016-09-25 DIAGNOSIS — G4733 Obstructive sleep apnea (adult) (pediatric): Secondary | ICD-10-CM | POA: Insufficient documentation

## 2016-09-25 HISTORY — DX: Prediabetes: R73.03

## 2016-09-25 LAB — BASIC METABOLIC PANEL
Anion gap: 5 (ref 5–15)
BUN: 13 mg/dL (ref 6–20)
CO2: 26 mmol/L (ref 22–32)
CREATININE: 1.48 mg/dL — AB (ref 0.44–1.00)
Calcium: 9.3 mg/dL (ref 8.9–10.3)
Chloride: 108 mmol/L (ref 101–111)
GFR calc Af Amer: 40 mL/min — ABNORMAL LOW (ref >60)
GFR, EST NON AFRICAN AMERICAN: 35 mL/min — AB (ref >60)
GLUCOSE: 132 mg/dL — AB (ref 65–99)
POTASSIUM: 3.7 mmol/L (ref 3.5–5.1)
Sodium: 139 mmol/L (ref 135–145)

## 2016-09-25 LAB — URINALYSIS, COMPLETE (UACMP) WITH MICROSCOPIC
BILIRUBIN URINE: NEGATIVE
GLUCOSE, UA: NEGATIVE mg/dL
HGB URINE DIPSTICK: NEGATIVE
Ketones, ur: NEGATIVE mg/dL
NITRITE: NEGATIVE
PROTEIN: NEGATIVE mg/dL
Specific Gravity, Urine: 1.013 (ref 1.005–1.030)
pH: 5 (ref 5.0–8.0)

## 2016-09-25 LAB — CBC
HEMATOCRIT: 43 % (ref 36.0–46.0)
Hemoglobin: 14.2 g/dL (ref 12.0–15.0)
MCH: 31.4 pg (ref 26.0–34.0)
MCHC: 33 g/dL (ref 30.0–36.0)
MCV: 95.1 fL (ref 78.0–100.0)
PLATELETS: 175 10*3/uL (ref 150–400)
RBC: 4.52 MIL/uL (ref 3.87–5.11)
RDW: 13.4 % (ref 11.5–15.5)
WBC: 4.9 10*3/uL (ref 4.0–10.5)

## 2016-09-25 LAB — TYPE AND SCREEN
ABO/RH(D): A POS
Antibody Screen: NEGATIVE

## 2016-09-25 LAB — SURGICAL PCR SCREEN
MRSA, PCR: NEGATIVE
Staphylococcus aureus: NEGATIVE

## 2016-09-25 LAB — HEMOGLOBIN A1C
HEMOGLOBIN A1C: 5.8 % — AB (ref 4.8–5.6)
MEAN PLASMA GLUCOSE: 119.76 mg/dL

## 2016-09-25 LAB — ABO/RH: ABO/RH(D): A POS

## 2016-09-25 NOTE — Progress Notes (Signed)
Dr. Saunders Revel said it was ok for patient to hold Eliquis 3 days prior to surgery.  Left message for patient about those instructions.

## 2016-09-25 NOTE — Progress Notes (Signed)
PCP - [place]  Nephrologist - [place]   Chest x-ray - 02/17/16 EKG - 07/06/16 Stress Test - 06/2016 ECHO - 02/2016 Cardiac Cath - denies  Sleep Study - 2018   Eliquis instructions were to stop 2 days prior send staff message to Dr. Saunders Revel about stopping 3 days prior awaiting response.  Told patient we would call her if instructions change for Eliquis    Patient denies shortness of breath, fever, cough and chest pain at PAT appointment   Patient verbalized understanding of instructions that were given to them at the PAT appointment. Patient was also instructed that they will need to review over the PAT instructions again at home before surgery.

## 2016-09-26 LAB — URINE CULTURE

## 2016-09-28 NOTE — Progress Notes (Signed)
Anesthesia Chart Review:  Pt is a 71 year old female scheduled for L total knee arthroplasty on 10/07/2016 with Kathryne Hitch, MD  - PCP is Harlan Stains, MD - Cardiologist is Nelva Bush, MD who has cleared pt for surgery.   PMH includes:  Persistent atrial fibrillation (s/p cardioversion 03/19/16, 02/24/16), HTN, pre-diabetes, hyperlipidemia, OSA, CKD (stage III), GERD. Never smoker. BMI 37. S/p lumbar laminectomy 04/05/13.   Medications include: amiodarone, eliquis, lasix, metoprolol, potassium, pravastatin. Pt to hold eliquis 3 days before surgery.   BP (!) 151/61   Pulse (!) 58   Temp 36.6 C   Resp 20   Ht 5\' 7"  (1.702 m)   Wt 235 lb 11.2 oz (106.9 kg)   SpO2 99%   BMI 36.92 kg/m   Preoperative labs reviewed.  PT/INR will be obtained DOS  CXR 02/17/16: No active stable cardiomegaly with mild aortic atherosclerosis.  EKG 07/06/16: sinus bradycardia (55 bpm).   Nuclear stress test 07/07/16:   Nuclear stress EF: 64%.  There was no ST segment deviation noted during stress.  The study is normal.  The left ventricular ejection fraction is normal (55-65%). - Normal pharmacologic nuclear stress test with no evidence of prior infarct or ischemia.  TEE 02/24/16:  - LA, LA appendage without masses - LA large - Suspicious for small PFO by color doppler - TV is normal  Mod TR - MV is mildly thickend  MR is at least moderate - AV is mildly thickened.  Trivial AI - PV is normal  No PI - LVEF is mildly depressed - Mild fixed plaquing of the thoraicic aorta.   If no changes, I anticipate pt can proceed with surgery as scheduled.   Willeen Cass, FNP-BC [facility]/Anesthesiology Phone: [phone] 09/28/2016 4:15 PM

## 2016-10-06 MED ORDER — SODIUM CHLORIDE 0.9 % IV SOLN
1000.0000 mg | INTRAVENOUS | Status: AC
  Administered 2016-10-07: 1000 mg via INTRAVENOUS
  Filled 2016-10-06: qty 10

## 2016-10-07 ENCOUNTER — Inpatient Hospital Stay (HOSPITAL_COMMUNITY): Payer: Medicare Other | Admitting: Certified Registered"

## 2016-10-07 ENCOUNTER — Inpatient Hospital Stay (HOSPITAL_COMMUNITY)
Admission: RE | Admit: 2016-10-07 | Discharge: 2016-10-09 | DRG: 470 | Disposition: A | Discharge status: Home Health | Payer: Medicare Other | Source: Ambulatory Visit | Attending: Orthopedic Surgery | Admitting: Orthopedic Surgery

## 2016-10-07 ENCOUNTER — Encounter (HOSPITAL_COMMUNITY)
Admission: RE | Disposition: A | Discharge status: Home Health | Payer: Self-pay | Source: Ambulatory Visit | Attending: Orthopedic Surgery

## 2016-10-07 ENCOUNTER — Inpatient Hospital Stay (HOSPITAL_COMMUNITY): Payer: Medicare Other | Admitting: Emergency Medicine

## 2016-10-07 ENCOUNTER — Inpatient Hospital Stay (HOSPITAL_COMMUNITY): Payer: Medicare Other

## 2016-10-07 ENCOUNTER — Encounter (HOSPITAL_COMMUNITY): Payer: Self-pay | Admitting: *Deleted

## 2016-10-07 DIAGNOSIS — I5030 Unspecified diastolic (congestive) heart failure: Secondary | ICD-10-CM | POA: Diagnosis present

## 2016-10-07 DIAGNOSIS — G4730 Sleep apnea, unspecified: Secondary | ICD-10-CM | POA: Diagnosis present

## 2016-10-07 DIAGNOSIS — K219 Gastro-esophageal reflux disease without esophagitis: Secondary | ICD-10-CM | POA: Diagnosis present

## 2016-10-07 DIAGNOSIS — I130 Hypertensive heart and chronic kidney disease with heart failure and stage 1 through stage 4 chronic kidney disease, or unspecified chronic kidney disease: Secondary | ICD-10-CM | POA: Diagnosis present

## 2016-10-07 DIAGNOSIS — R0609 Other forms of dyspnea: Secondary | ICD-10-CM | POA: Diagnosis present

## 2016-10-07 DIAGNOSIS — R0600 Dyspnea, unspecified: Secondary | ICD-10-CM | POA: Diagnosis present

## 2016-10-07 DIAGNOSIS — I340 Nonrheumatic mitral (valve) insufficiency: Secondary | ICD-10-CM | POA: Diagnosis present

## 2016-10-07 DIAGNOSIS — I480 Paroxysmal atrial fibrillation: Secondary | ICD-10-CM | POA: Diagnosis present

## 2016-10-07 DIAGNOSIS — D62 Acute posthemorrhagic anemia: Secondary | ICD-10-CM | POA: Diagnosis not present

## 2016-10-07 DIAGNOSIS — Z883 Allergy status to other anti-infective agents status: Secondary | ICD-10-CM | POA: Diagnosis not present

## 2016-10-07 DIAGNOSIS — E785 Hyperlipidemia, unspecified: Secondary | ICD-10-CM | POA: Diagnosis present

## 2016-10-07 DIAGNOSIS — M1712 Unilateral primary osteoarthritis, left knee: Principal | ICD-10-CM

## 2016-10-07 DIAGNOSIS — I10 Essential (primary) hypertension: Secondary | ICD-10-CM | POA: Diagnosis present

## 2016-10-07 DIAGNOSIS — Z888 Allergy status to other drugs, medicaments and biological substances status: Secondary | ICD-10-CM

## 2016-10-07 DIAGNOSIS — N183 Chronic kidney disease, stage 3 (moderate): Secondary | ICD-10-CM | POA: Diagnosis present

## 2016-10-07 DIAGNOSIS — Z96659 Presence of unspecified artificial knee joint: Secondary | ICD-10-CM

## 2016-10-07 DIAGNOSIS — Z23 Encounter for immunization: Secondary | ICD-10-CM

## 2016-10-07 HISTORY — PX: TOTAL KNEE ARTHROPLASTY: SHX125

## 2016-10-07 LAB — PROTIME-INR
INR: 0.91
PROTHROMBIN TIME: 12.1 s (ref 11.4–15.2)

## 2016-10-07 SURGERY — ARTHROPLASTY, KNEE, TOTAL
Anesthesia: Spinal | Site: Knee | Laterality: Left

## 2016-10-07 MED ORDER — ALUM & MAG HYDROXIDE-SIMETH 200-200-20 MG/5ML PO SUSP: 30.0000 mL | ORAL | Status: DC | PRN

## 2016-10-07 MED ORDER — CHLORHEXIDINE GLUCONATE 4 % EX LIQD: 60.0000 mL | Freq: Once | CUTANEOUS | Status: DC

## 2016-10-07 MED ORDER — POLYETHYLENE GLYCOL 3350 17 G PO PACK: 17.0000 g | PACK | Freq: Every day | ORAL | Status: DC | PRN

## 2016-10-07 MED ORDER — DIPHENHYDRAMINE HCL 12.5 MG/5ML PO ELIX: 12.5000 mg | ORAL_SOLUTION | ORAL | Status: DC | PRN

## 2016-10-07 MED ORDER — BUPIVACAINE LIPOSOME 1.3 % IJ SUSP
20.0000 mL | Freq: Once | INTRAMUSCULAR | Status: DC
  Filled 2016-10-07: qty 20

## 2016-10-07 MED ORDER — CEFAZOLIN SODIUM-DEXTROSE 2-4 GM/100ML-% IV SOLN
INTRAVENOUS | Status: AC
  Filled 2016-10-07: qty 100

## 2016-10-07 MED ORDER — AMIODARONE HCL 100 MG PO TABS
200.0000 mg | ORAL_TABLET | Freq: Every day | ORAL | Status: DC
  Administered 2016-10-07: 200 mg via ORAL
  Filled 2016-10-07 (×2): qty 2

## 2016-10-07 MED ORDER — BUPROPION HCL ER (XL) 150 MG PO TB24
300.0000 mg | ORAL_TABLET | Freq: Every morning | ORAL | Status: DC
  Administered 2016-10-08 – 2016-10-09 (×2): 300 mg via ORAL
  Filled 2016-10-07 (×2): qty 2

## 2016-10-07 MED ORDER — ONDANSETRON HCL 4 MG/2ML IJ SOLN
INTRAMUSCULAR | Status: AC
  Filled 2016-10-07: qty 2

## 2016-10-07 MED ORDER — FUROSEMIDE 20 MG PO TABS
20.0000 mg | ORAL_TABLET | Freq: Every day | ORAL | Status: DC
  Administered 2016-10-07 – 2016-10-09 (×3): 20 mg via ORAL
  Filled 2016-10-07 (×3): qty 1

## 2016-10-07 MED ORDER — FENTANYL CITRATE (PF) 100 MCG/2ML IJ SOLN
INTRAMUSCULAR | Status: AC
  Administered 2016-10-07: 50 ug via INTRAVENOUS
  Filled 2016-10-07: qty 2

## 2016-10-07 MED ORDER — SODIUM CHLORIDE 0.9 % IJ SOLN
INTRAMUSCULAR | Status: DC | PRN
  Administered 2016-10-07: 40 mL

## 2016-10-07 MED ORDER — POTASSIUM CHLORIDE CRYS ER 20 MEQ PO TBCR
20.0000 meq | EXTENDED_RELEASE_TABLET | Freq: Every day | ORAL | Status: DC
  Administered 2016-10-07 – 2016-10-09 (×3): 20 meq via ORAL
  Filled 2016-10-07 (×3): qty 1

## 2016-10-07 MED ORDER — FENTANYL CITRATE (PF) 100 MCG/2ML IJ SOLN
INTRAMUSCULAR | Status: AC
  Filled 2016-10-07: qty 2

## 2016-10-07 MED ORDER — MAGNESIUM CITRATE PO SOLN: 1.0000 | Freq: Once | ORAL | Status: DC | PRN

## 2016-10-07 MED ORDER — LIDOCAINE HCL (CARDIAC) 20 MG/ML IV SOLN
INTRAVENOUS | Status: DC | PRN
  Administered 2016-10-07: 40 mg via INTRAVENOUS

## 2016-10-07 MED ORDER — CEFAZOLIN SODIUM-DEXTROSE 2-4 GM/100ML-% IV SOLN
2.0000 g | INTRAVENOUS | Status: AC
  Administered 2016-10-07: 2 g via INTRAVENOUS

## 2016-10-07 MED ORDER — ACETAMINOPHEN 325 MG PO TABS
650.0000 mg | ORAL_TABLET | Freq: Four times a day (QID) | ORAL | Status: DC | PRN
  Administered 2016-10-08: 650 mg via ORAL
  Filled 2016-10-07: qty 2

## 2016-10-07 MED ORDER — HYDROMORPHONE HCL 1 MG/ML IJ SOLN
0.5000 mg | INTRAMUSCULAR | Status: DC | PRN
  Administered 2016-10-07 – 2016-10-08 (×4): 1 mg via INTRAVENOUS
  Filled 2016-10-07 (×5): qty 1

## 2016-10-07 MED ORDER — ONDANSETRON HCL 4 MG PO TABS: 4.0000 mg | ORAL_TABLET | Freq: Four times a day (QID) | ORAL | Status: DC | PRN

## 2016-10-07 MED ORDER — OXYCODONE HCL 5 MG PO TABS
5.0000 mg | ORAL_TABLET | ORAL | Status: DC | PRN
  Administered 2016-10-07 – 2016-10-09 (×8): 10 mg via ORAL
  Filled 2016-10-07 (×9): qty 2

## 2016-10-07 MED ORDER — ONDANSETRON HCL 4 MG/2ML IJ SOLN: 4.0000 mg | Freq: Four times a day (QID) | INTRAMUSCULAR | Status: DC | PRN

## 2016-10-07 MED ORDER — PRAVASTATIN SODIUM 40 MG PO TABS
40.0000 mg | ORAL_TABLET | Freq: Every day | ORAL | Status: DC
  Administered 2016-10-07 – 2016-10-08 (×2): 40 mg via ORAL
  Filled 2016-10-07 (×2): qty 1

## 2016-10-07 MED ORDER — ONDANSETRON HCL 4 MG/2ML IJ SOLN: 4.0000 mg | Freq: Once | INTRAMUSCULAR | Status: DC | PRN

## 2016-10-07 MED ORDER — MENTHOL 3 MG MT LOZG
1.0000 | LOZENGE | OROMUCOSAL | Status: DC | PRN
  Administered 2016-10-09: 3 mg via ORAL
  Filled 2016-10-07: qty 9

## 2016-10-07 MED ORDER — CEFAZOLIN SODIUM-DEXTROSE 2-4 GM/100ML-% IV SOLN
2.0000 g | Freq: Four times a day (QID) | INTRAVENOUS | Status: AC
  Administered 2016-10-07 – 2016-10-08 (×2): 2 g via INTRAVENOUS
  Filled 2016-10-07 (×3): qty 100

## 2016-10-07 MED ORDER — BUPIVACAINE LIPOSOME 1.3 % IJ SUSP
INTRAMUSCULAR | Status: DC | PRN
  Administered 2016-10-07: 20 mL

## 2016-10-07 MED ORDER — SODIUM CHLORIDE 0.9 % IR SOLN
Status: DC | PRN
  Administered 2016-10-07: 3000 mL

## 2016-10-07 MED ORDER — APIXABAN 5 MG PO TABS
5.0000 mg | ORAL_TABLET | Freq: Two times a day (BID) | ORAL | Status: DC
  Administered 2016-10-08 – 2016-10-09 (×3): 5 mg via ORAL
  Filled 2016-10-07 (×3): qty 1

## 2016-10-07 MED ORDER — LACTATED RINGERS IV SOLN
INTRAVENOUS | Status: DC
  Administered 2016-10-07 (×2): via INTRAVENOUS

## 2016-10-07 MED ORDER — OXYCODONE HCL 5 MG PO TABS: 5.0000 mg | ORAL_TABLET | Freq: Once | ORAL | Status: DC | PRN

## 2016-10-07 MED ORDER — MIDAZOLAM HCL 2 MG/2ML IJ SOLN
INTRAMUSCULAR | Status: AC
  Administered 2016-10-07: 1 mg via INTRAVENOUS
  Filled 2016-10-07: qty 2

## 2016-10-07 MED ORDER — DEXAMETHASONE SODIUM PHOSPHATE 10 MG/ML IJ SOLN
INTRAMUSCULAR | Status: AC
  Filled 2016-10-07: qty 1

## 2016-10-07 MED ORDER — POTASSIUM CHLORIDE IN NACL 20-0.9 MEQ/L-% IV SOLN
INTRAVENOUS | Status: DC
  Administered 2016-10-07: 100 mL/h via INTRAVENOUS
  Administered 2016-10-08: 06:00:00 via INTRAVENOUS
  Filled 2016-10-07 (×2): qty 1000

## 2016-10-07 MED ORDER — CELECOXIB 200 MG PO CAPS
200.0000 mg | ORAL_CAPSULE | Freq: Two times a day (BID) | ORAL | Status: DC
  Administered 2016-10-07 – 2016-10-09 (×4): 200 mg via ORAL
  Filled 2016-10-07 (×4): qty 1

## 2016-10-07 MED ORDER — ONDANSETRON HCL 4 MG PO TABS: 4.0000 mg | ORAL_TABLET | Freq: Three times a day (TID) | ORAL | 0 refills | Status: DC | PRN

## 2016-10-07 MED ORDER — PHENOL 1.4 % MT LIQD: 1.0000 | OROMUCOSAL | Status: DC | PRN

## 2016-10-07 MED ORDER — METOCLOPRAMIDE HCL 5 MG/ML IJ SOLN: 5.0000 mg | Freq: Three times a day (TID) | INTRAMUSCULAR | Status: DC | PRN

## 2016-10-07 MED ORDER — PHENYLEPHRINE HCL 10 MG/ML IJ SOLN
INTRAVENOUS | Status: DC | PRN
  Administered 2016-10-07: 20 ug/min via INTRAVENOUS

## 2016-10-07 MED ORDER — FENTANYL CITRATE (PF) 250 MCG/5ML IJ SOLN
INTRAMUSCULAR | Status: AC
  Filled 2016-10-07: qty 5

## 2016-10-07 MED ORDER — ZOLPIDEM TARTRATE 5 MG PO TABS: 5.0000 mg | ORAL_TABLET | Freq: Every evening | ORAL | Status: DC | PRN

## 2016-10-07 MED ORDER — 0.9 % SODIUM CHLORIDE (POUR BTL) OPTIME
TOPICAL | Status: DC | PRN
Start: 2016-10-07 — End: 2016-10-07
  Administered 2016-10-07: 1000 mL

## 2016-10-07 MED ORDER — LIDOCAINE 2% (20 MG/ML) 5 ML SYRINGE
INTRAMUSCULAR | Status: AC
  Filled 2016-10-07: qty 5

## 2016-10-07 MED ORDER — ONDANSETRON HCL 4 MG/2ML IJ SOLN
INTRAMUSCULAR | Status: DC | PRN
  Administered 2016-10-07: 4 mg via INTRAVENOUS

## 2016-10-07 MED ORDER — PROPOFOL 500 MG/50ML IV EMUL
INTRAVENOUS | Status: DC | PRN
  Administered 2016-10-07: 50 ug/kg/min via INTRAVENOUS

## 2016-10-07 MED ORDER — FENTANYL CITRATE (PF) 100 MCG/2ML IJ SOLN
50.0000 ug | INTRAMUSCULAR | Status: DC | PRN
  Administered 2016-10-07 (×2): 50 ug via INTRAVENOUS

## 2016-10-07 MED ORDER — DOCUSATE SODIUM 100 MG PO CAPS
100.0000 mg | ORAL_CAPSULE | Freq: Two times a day (BID) | ORAL | Status: DC
  Administered 2016-10-07 – 2016-10-09 (×4): 100 mg via ORAL
  Filled 2016-10-07 (×4): qty 1

## 2016-10-07 MED ORDER — FENTANYL CITRATE (PF) 100 MCG/2ML IJ SOLN
25.0000 ug | INTRAMUSCULAR | Status: DC | PRN
  Administered 2016-10-07 (×4): 25 ug via INTRAVENOUS

## 2016-10-07 MED ORDER — OXYCODONE HCL 5 MG/5ML PO SOLN: 5.0000 mg | Freq: Once | ORAL | Status: DC | PRN

## 2016-10-07 MED ORDER — BUPIVACAINE-EPINEPHRINE (PF) 0.5% -1:200000 IJ SOLN
INTRAMUSCULAR | Status: AC
  Filled 2016-10-07: qty 30

## 2016-10-07 MED ORDER — METOPROLOL SUCCINATE ER 25 MG PO TB24
12.5000 mg | ORAL_TABLET | Freq: Every day | ORAL | Status: DC
  Administered 2016-10-07: 12.5 mg via ORAL
  Filled 2016-10-07 (×2): qty 1

## 2016-10-07 MED ORDER — ACETAMINOPHEN 650 MG RE SUPP: 650.0000 mg | Freq: Four times a day (QID) | RECTAL | Status: DC | PRN

## 2016-10-07 MED ORDER — BISACODYL 5 MG PO TBEC: 5.0000 mg | DELAYED_RELEASE_TABLET | Freq: Every day | ORAL | Status: DC | PRN

## 2016-10-07 MED ORDER — METOCLOPRAMIDE HCL 5 MG PO TABS: 5.0000 mg | ORAL_TABLET | Freq: Three times a day (TID) | ORAL | Status: DC | PRN

## 2016-10-07 MED ORDER — MIDAZOLAM HCL 2 MG/2ML IJ SOLN
1.0000 mg | INTRAMUSCULAR | Status: DC | PRN
Start: 2016-10-07 — End: 2016-10-07
  Administered 2016-10-07: 1 mg via INTRAVENOUS

## 2016-10-07 MED ORDER — OXYCODONE-ACETAMINOPHEN 5-325 MG PO TABS: 1.0000 | ORAL_TABLET | ORAL | 0 refills | Status: DC | PRN

## 2016-10-07 MED ORDER — DEXAMETHASONE SODIUM PHOSPHATE 10 MG/ML IJ SOLN
INTRAMUSCULAR | Status: DC | PRN
  Administered 2016-10-07: 4 mg via INTRAVENOUS

## 2016-10-07 SURGICAL SUPPLY — 56 items
BANDAGE ACE 4X5 VEL STRL LF (GAUZE/BANDAGES/DRESSINGS) ×2 IMPLANT
BANDAGE ACE 6X5 VEL STRL LF (GAUZE/BANDAGES/DRESSINGS) ×2 IMPLANT
BANDAGE ESMARK 6X9 LF (GAUZE/BANDAGES/DRESSINGS) ×1 IMPLANT
BLADE SAG 18X100X1.27 (BLADE) ×4 IMPLANT
BNDG CMPR 9X6 STRL LF SNTH (GAUZE/BANDAGES/DRESSINGS) ×1
BNDG ESMARK 6X9 LF (GAUZE/BANDAGES/DRESSINGS) ×2
BOWL SMART MIX CTS (DISPOSABLE) IMPLANT
CAPT KNEE TRIATH TK-4 ×1 IMPLANT
CLSR STERI-STRIP ANTIMIC 1/2X4 (GAUZE/BANDAGES/DRESSINGS) ×1 IMPLANT
COVER SURGICAL LIGHT HANDLE (MISCELLANEOUS) ×2 IMPLANT
CUFF TOURNIQUET SINGLE 34IN LL (TOURNIQUET CUFF) ×2 IMPLANT
DRAPE EXTREMITY T 121X128X90 (DRAPE) ×2 IMPLANT
DRAPE U-SHAPE 47X51 STRL (DRAPES) ×2 IMPLANT
DRSG AQUACEL AG ADV 3.5X10 (GAUZE/BANDAGES/DRESSINGS) ×2 IMPLANT
DURAPREP 26ML APPLICATOR (WOUND CARE) ×4 IMPLANT
ELECT CAUTERY BLADE 6.4 (BLADE) ×2 IMPLANT
ELECT REM PT RETURN 9FT ADLT (ELECTROSURGICAL) ×2
ELECTRODE REM PT RTRN 9FT ADLT (ELECTROSURGICAL) ×1 IMPLANT
FACESHIELD WRAPAROUND (MASK) ×4 IMPLANT
FACESHIELD WRAPAROUND OR TEAM (MASK) ×2 IMPLANT
GLOVE BIOGEL PI IND STRL 7.0 (GLOVE) ×1 IMPLANT
GLOVE BIOGEL PI INDICATOR 7.0 (GLOVE) ×1
GLOVE ECLIPSE 7.0 STRL STRAW (GLOVE) ×2 IMPLANT
GLOVE ORTHO TXT STRL SZ7.5 (GLOVE) ×2 IMPLANT
GOWN STRL REUS W/ TWL LRG LVL3 (GOWN DISPOSABLE) ×2 IMPLANT
GOWN STRL REUS W/ TWL XL LVL3 (GOWN DISPOSABLE) ×1 IMPLANT
GOWN STRL REUS W/TWL LRG LVL3 (GOWN DISPOSABLE) ×4
GOWN STRL REUS W/TWL XL LVL3 (GOWN DISPOSABLE) ×2
HANDPIECE INTERPULSE COAX TIP (DISPOSABLE) ×2
IMMOBILIZER KNEE 22 UNIV (SOFTGOODS) ×1 IMPLANT
KIT BASIN OR (CUSTOM PROCEDURE TRAY) ×2 IMPLANT
KIT ROOM TURNOVER OR (KITS) ×2 IMPLANT
MANIFOLD NEPTUNE II (INSTRUMENTS) ×2 IMPLANT
NDL 18GX1X1/2 (RX/OR ONLY) (NEEDLE) ×1 IMPLANT
NDL HYPO 25GX1X1/2 BEV (NEEDLE) IMPLANT
NEEDLE 18GX1X1/2 (RX/OR ONLY) (NEEDLE) ×2 IMPLANT
NEEDLE HYPO 25GX1X1/2 BEV (NEEDLE) IMPLANT
NS IRRIG 1000ML POUR BTL (IV SOLUTION) ×2 IMPLANT
PACK TOTAL JOINT (CUSTOM PROCEDURE TRAY) ×2 IMPLANT
PAD ARMBOARD 7.5X6 YLW CONV (MISCELLANEOUS) ×4 IMPLANT
SET HNDPC FAN SPRY TIP SCT (DISPOSABLE) ×1 IMPLANT
STRIP CLOSURE SKIN 1/2X4 (GAUZE/BANDAGES/DRESSINGS) ×4 IMPLANT
SUCTION FRAZIER HANDLE 10FR (MISCELLANEOUS)
SUCTION TUBE FRAZIER 10FR DISP (MISCELLANEOUS) IMPLANT
SUT MNCRL AB 4-0 PS2 18 (SUTURE) ×2 IMPLANT
SUT VIC AB 0 CT1 27 (SUTURE)
SUT VIC AB 0 CT1 27XBRD ANBCTR (SUTURE) IMPLANT
SUT VIC AB 1 CTX 36 (SUTURE) ×2
SUT VIC AB 1 CTX36XBRD ANBCTR (SUTURE) ×1 IMPLANT
SUT VIC AB 2-0 CT1 27 (SUTURE) ×4
SUT VIC AB 2-0 CT1 TAPERPNT 27 (SUTURE) ×2 IMPLANT
SYR 50ML LL SCALE MARK (SYRINGE) ×2 IMPLANT
SYR CONTROL 10ML LL (SYRINGE) IMPLANT
TOWEL GREEN STERILE (TOWEL DISPOSABLE) ×2 IMPLANT
TRAY CATH 16FR W/PLASTIC CATH (SET/KITS/TRAYS/PACK) IMPLANT
WATER STERILE IRR 1000ML POUR (IV SOLUTION) ×2 IMPLANT

## 2016-10-07 NOTE — Anesthesia Procedure Notes (Signed)
Procedure Name: MAC Date/Time: 10/07/2016 11:25 AM Performed by: Teressa Lower Pre-anesthesia Checklist: Patient identified, Emergency Drugs available, Suction available, Patient being monitored and Timeout performed Patient Re-evaluated:Patient Re-evaluated prior to induction Oxygen Delivery Method: Simple face mask Ventilation: Oral airway inserted - appropriate to patient size

## 2016-10-07 NOTE — Transfer of Care (Signed)
Immediate Anesthesia Transfer of Care Note  Patient: [name] [name]  Procedure(s) Performed: Procedure(s): TOTAL KNEE ARTHROPLASTY (Left)  Patient Location: PACU  Anesthesia Type:Spinal and MAC combined with regional for post-op pain  Level of Consciousness: awake, alert  and oriented  Airway & Oxygen Therapy: Patient Spontanous Breathing and Patient connected to nasal cannula oxygen  Post-op Assessment: Report given to RN and Post -op Vital signs reviewed and stable  Post vital signs: Reviewed and stable  Last Vitals:  Vitals:   10/07/16 1015 10/07/16 1035  BP:  (!) 176/69  Pulse: 65 70  Resp: 14 18  Temp:    SpO2: 99% 100%    Last Pain:  Vitals:   10/07/16 1002  TempSrc: Oral         Complications: No apparent anesthesia complications

## 2016-10-07 NOTE — Progress Notes (Signed)
Orthopedic Tech Progress Note Patient Details:  [name] [name] 1945/09/15 [id number]  CPM Left Knee CPM Left Knee: On Left Knee Flexion (Degrees): 90 Left Knee Extension (Degrees): 0   Antania Hoefling 10/07/2016, 3:12 PM Trapeze bar patient helper not applied because pt's weight exceeds durability of frame; RN notified

## 2016-10-07 NOTE — Anesthesia Procedure Notes (Signed)
Spinal  Patient location during procedure: OR Start time: 10/07/2016 10:50 AM End time: 10/07/2016 10:55 AM Staffing Anesthesiologist: Linna Caprice, Rishon Thilges Performed: anesthesiologist  Preanesthetic Checklist Completed: patient identified, site marked, surgical consent, pre-op evaluation, timeout performed, IV checked, risks and benefits discussed and monitors and equipment checked Spinal Block Patient position: sitting Prep: DuraPrep Patient monitoring: heart rate, cardiac monitor, continuous pulse ox and blood pressure Approach: midline Location: L3-4 Injection technique: single-shot Needle Needle type: Pencan  Needle gauge: 24 G Needle length: 9 cm Needle insertion depth: 7 cm Assessment Sensory level: T6 Additional Notes 14 mg 0.75% Bupivacaine injected easily

## 2016-10-07 NOTE — Interval H&P Note (Signed)
History and Physical Interval Note:  10/07/2016 10:47 AM  [name] [name]  has presented today for surgery, with the diagnosis of djd left knee  The various methods of treatment have been discussed with the patient and family. After consideration of risks, benefits and other options for treatment, the patient has consented to  Procedure(s): TOTAL KNEE ARTHROPLASTY (Left) as a surgical intervention .  The patient's history has been reviewed, patient examined, no change in status, stable for surgery.  I have reviewed the patient's chart and labs.  Questions were answered to the patient's satisfaction.     [name]

## 2016-10-07 NOTE — Discharge Summary (Addendum)
Patient ID: [name] [name] MRN: [id number] DOB/AGE: 70-Sep-1947 70 y.o.  Admit date: 10/07/2016 Discharge date: 10/09/2016  Admission Diagnoses:  Principal Problem:   Primary localized osteoarthritis of left knee Active Problems:   Paroxysmal atrial fibrillation (HCC)   Hyperlipidemia   Essential hypertension   Gastroesophageal reflux disease without esophagitis   Mitral regurgitation   Dyspnea on exertion   (HFpEF) heart failure with preserved ejection fraction [facility])   Discharge Diagnoses:  Same  Past Medical History:  Diagnosis Date  . Anxiety   . Arthritis   . Cataracts, bilateral    immature  . CKD (chronic kidney disease) stage 3, GFR 30-59 ml/min   . Depression   . GERD (gastroesophageal reflux disease)    was on Nexium   . GERD (gastroesophageal reflux disease)   . Hemorrhoid   . History of bronchitis    couple of yrs ago  . History of colon polyps   . History of peristent atrial fibrillation   . Hyperlipidemia    takes Pravastatin daily  . Hypertension    takes Amlodipine and Diovan daily  . Hypertension   . Pre-diabetes   . Sleep apnea    no cpap used  . Sleep apnea    study done 20+yrs ago and doesn't use cpap  . Urinary urgency   . Weakness    in left leg r/t back    Surgeries: Procedure(s): TOTAL KNEE ARTHROPLASTY on 10/07/2016   Consultants:   Discharged Condition: Improved  Hospital Course: [name] [name] is an 71 y.o. female who was admitted 10/07/2016 for operative treatment ofPrimary localized osteoarthritis of left knee. Patient has severe unremitting pain that affects sleep, daily activities, and work/hobbies. After pre-op clearance the patient was taken to the operating room on 10/07/2016 and underwent  Procedure(s): TOTAL KNEE ARTHROPLASTY.    Patient was given perioperative antibiotics:  Anti-infectives    Start     Dose/Rate Route Frequency Ordered Stop   10/07/16 1900  ceFAZolin (ANCEF) IVPB 2g/100 mL premix     2 g 200 mL/hr over 30  Minutes Intravenous Every 6 hours 10/07/16 1809 10/08/16 0222   10/07/16 0947  ceFAZolin (ANCEF) 2-4 GM/100ML-% IVPB    Comments:  Rosenberger, Meredit: cabinet override      10/07/16 0947 10/07/16 1100   10/07/16 0946  ceFAZolin (ANCEF) IVPB 2g/100 mL premix     2 g 200 mL/hr over 30 Minutes Intravenous On call to O.R. 10/07/16 0946 10/07/16 1100       Patient was given sequential compression devices, early ambulation, and chemoprophylaxis to prevent DVT.  Patient benefited maximally from hospital stay and there were no complications.    Recent vital signs:  Patient Vitals for the past 24 hrs:  BP Temp Temp src Pulse Resp SpO2  10/09/16 0500 134/71 98.2 F (36.8 C) Oral 79 17 98 %  10/08/16 2147 (!) 142/57 98.2 F (36.8 C) Oral 83 17 96 %  10/08/16 1453 (!) 137/47 98.7 F (37.1 C) Oral 69 17 98 %     Recent laboratory studies:   Recent Labs  10/07/16 0948  10/08/16 0637 10/09/16 0611  WBC  --   --  10.6* 11.3*  HGB  --   --  11.9* 11.0*  HCT  --   --  37.1 34.8*  PLT  --   --  189 157  NA  --   --  138 136  K  --   --  4.5 4.4  CL  --   --  103 103  CO2  --   --  25 28  BUN  --   --  12 15  CREATININE  --   --  1.09* 1.23*  GLUCOSE  --   --  143* 118*  INR 0.91  --   --   --   CALCIUM  --   < > 8.7* 8.8*  < > = values in this interval not displayed.   Discharge Medications:   Allergies as of 10/09/2016      Reactions   Lexapro [escitalopram]    Makes her feel funny   Peroxide [hydrogen Peroxide] Other (See Comments)   Redness.    Zoloft [sertraline Hcl]    Makes her feel funny   Neosporin [neomycin-bacitracin Zn-polymyx] Rash   Blisters, itching.       Medication List    STOP taking these medications   acetaminophen 500 MG tablet Commonly known as:  TYLENOL     TAKE these medications   amiodarone 200 MG tablet Commonly known as:  PACERONE Take 1 tablet (200 mg total) by mouth daily.   apixaban 5 MG Tabs tablet Commonly known as:  ELIQUIS Take  1 tablet (5 mg total) by mouth 2 (two) times daily.   buPROPion 300 MG 24 hr tablet Commonly known as:  WELLBUTRIN XL Take 300 mg by mouth every morning.   CALCIUM PO Take 1 tablet by mouth every morning.   [employer] MELATONIN PO Take 1 tablet by mouth at bedtime as needed.   fexofenadine 180 MG tablet Commonly known as:  ALLEGRA Take 180 mg by mouth daily as needed for allergies or rhinitis.   furosemide 20 MG tablet Commonly known as:  LASIX TAKE 1 TABLET BY MOUTH ONCE DAILY   metoprolol succinate 25 MG 24 hr tablet Commonly known as:  TOPROL XL Take 0.5 tablets (12.5 mg total) by mouth daily.   ondansetron 4 MG tablet Commonly known as:  ZOFRAN Take 1 tablet (4 mg total) by mouth every 8 (eight) hours as needed for nausea or vomiting.   oxyCODONE-acetaminophen 5-325 MG tablet Commonly known as:  ROXICET Take 1-2 tablets by mouth every 4 (four) hours as needed.   potassium chloride SA 20 MEQ tablet Commonly known as:  K-DUR,KLOR-CON Take 1 tablet (20 mEq total) by mouth daily.   pravastatin 40 MG tablet Commonly known as:  PRAVACHOL Take 40 mg by mouth at bedtime.   vitamin C with rose hips 1000 MG tablet Take 1,000 mg by mouth daily.   Vitamin D 2000 units tablet Take 4,000 Units by mouth daily.            Discharge Care Instructions        Start     Ordered   10/07/16 0000  ondansetron (ZOFRAN) 4 MG tablet  Every 8 hours PRN     10/07/16 1151   10/07/16 0000  oxyCODONE-acetaminophen (ROXICET) 5-325 MG tablet  Every 4 hours PRN     10/07/16 1151      Diagnostic Studies: Dg Knee Left Port  Result Date: 10/07/2016 CLINICAL DATA:  Postop film left knee replacement EXAM: PORTABLE LEFT KNEE - 1-2 VIEW COMPARISON:  July 10, 2010 FINDINGS: Left knee replacement is identified without malalignment. Postsurgical changes including postsurgical air and fluid is identified in the left knee. IMPRESSION: Left knee replacement identified without malalignment.  Electronically Signed   By: Abelardo Diesel M.D.   On: 10/07/2016 13:23   Mm Digital Screening Bilateral  Result Date:  09/23/2016 CLINICAL DATA:  Screening. EXAM: DIGITAL SCREENING BILATERAL MAMMOGRAM WITH CAD COMPARISON:  Previous exam(s). ACR Breast Density Category a: The breast tissue is almost entirely fatty. FINDINGS: There are no findings suspicious for malignancy. Images were processed with CAD. IMPRESSION: No mammographic evidence of malignancy. A result letter of this screening mammogram will be mailed directly to the patient. RECOMMENDATION: Screening mammogram in one year. (Code:SM-B-01Y) BI-RADS CATEGORY  1: Negative. Electronically Signed   By: Trude Mcburney M.D.   On: 09/23/2016 13:42    Disposition: 01-Home or Self Care    Follow-up Information    [name], MD. Schedule an appointment as soon as possible for a visit in 2 week(s).   Specialty:  Orthopedic Surgery Contact information: [address] [address] [phone]        [facility] Follow up.   Specialty:  [place] Why:  A representative from [facility] will contact you to arrange start date and time for your therapy. Contact information: [address] [place] [address] [phone]            Signed: Fannie Knee 10/09/2016, 7:58 AM

## 2016-10-07 NOTE — Anesthesia Postprocedure Evaluation (Signed)
Anesthesia Post Note  Patient: [name] [name]  Procedure(s) Performed: Procedure(s) (LRB): TOTAL KNEE ARTHROPLASTY (Left)     Patient location during evaluation: PACU Anesthesia Type: Spinal Level of consciousness: awake, awake and alert and oriented Pain management: pain level controlled Vital Signs Assessment: post-procedure vital signs reviewed and stable Respiratory status: spontaneous breathing, nonlabored ventilation and respiratory function stable Cardiovascular status: blood pressure returned to baseline Postop Assessment: no headache and spinal receding Anesthetic complications: no    Last Vitals:  Vitals:   10/07/16 1445 10/07/16 1447  BP:  139/68  Pulse: (!) 59 (!) 58  Resp: 15 15  Temp:    SpO2: 97% 95%    Last Pain:  Vitals:   10/07/16 1330  TempSrc:   PainSc: 0-No pain                 German Manke COKER

## 2016-10-07 NOTE — Anesthesia Procedure Notes (Addendum)
Anesthesia Regional Block: Adductor canal block   Pre-Anesthetic Checklist: ,, timeout performed, Correct Patient, Correct Site, Correct Laterality, Correct Procedure, Correct Position, site marked, Risks and benefits discussed,  Surgical consent,  Pre-op evaluation,  At surgeon's request and post-op pain management  Laterality: Left  Prep: chloraprep       Needles:   Needle Type: Echogenic Stimulator Needle     Needle Length: 9cm  Needle Gauge: 22     Additional Needles:   Procedures:,,,, ultrasound used (permanent image in chart),,,,  Narrative:  Start time: 10/07/2016 10:35 AM End time: 10/07/2016 10:40 AM Injection made incrementally with aspirations every 5 mL.  Performed by: Personally  Anesthesiologist: Derrall Hicks  Additional Notes: 20 cc 0.75% Naropin injected easily

## 2016-10-07 NOTE — Anesthesia Preprocedure Evaluation (Signed)
Anesthesia Evaluation  Patient identified by MRN, date of birth, ID band Patient awake    Reviewed: Allergy & Precautions, NPO status , Patient's Chart, lab work & pertinent test results  Airway Mallampati: II  TM Distance: >3 FB Neck ROM: Full    Dental  (+) Teeth Intact, Dental Advisory Given   Pulmonary    breath sounds clear to auscultation       Cardiovascular hypertension,  Rhythm:Regular Rate:Normal     Neuro/Psych    GI/Hepatic   Endo/Other    Renal/GU      Musculoskeletal   Abdominal   Peds  Hematology   Anesthesia Other Findings   Reproductive/Obstetrics                             Anesthesia Physical Anesthesia Plan  ASA: III  Anesthesia Plan: General   Post-op Pain Management:  Regional for Post-op pain   Induction: Intravenous  PONV Risk Score and Plan: 1 and Ondansetron and Dexamethasone  Airway Management Planned: Natural Airway and Nasal Cannula  Additional Equipment:   Intra-op Plan:   Post-operative Plan:   Informed Consent: I have reviewed the patients History and Physical, chart, labs and discussed the procedure including the risks, benefits and alternatives for the proposed anesthesia with the patient or authorized representative who has indicated his/her understanding and acceptance.   Dental advisory given  Plan Discussed with: CRNA and Anesthesiologist  Anesthesia Plan Comments:         Anesthesia Quick Evaluation

## 2016-10-08 ENCOUNTER — Encounter (HOSPITAL_COMMUNITY): Payer: Self-pay | Admitting: General Practice

## 2016-10-08 LAB — CBC
HCT: 37.1 % (ref 36.0–46.0)
Hemoglobin: 11.9 g/dL — ABNORMAL LOW (ref 12.0–15.0)
MCH: 30.5 pg (ref 26.0–34.0)
MCHC: 32.1 g/dL (ref 30.0–36.0)
MCV: 95.1 fL (ref 78.0–100.0)
PLATELETS: 189 10*3/uL (ref 150–400)
RBC: 3.9 MIL/uL (ref 3.87–5.11)
RDW: 13.4 % (ref 11.5–15.5)
WBC: 10.6 10*3/uL — ABNORMAL HIGH (ref 4.0–10.5)

## 2016-10-08 LAB — BASIC METABOLIC PANEL
Anion gap: 10 (ref 5–15)
BUN: 12 mg/dL (ref 6–20)
CALCIUM: 8.7 mg/dL — AB (ref 8.9–10.3)
CO2: 25 mmol/L (ref 22–32)
CREATININE: 1.09 mg/dL — AB (ref 0.44–1.00)
Chloride: 103 mmol/L (ref 101–111)
GFR calc Af Amer: 58 mL/min — ABNORMAL LOW (ref >60)
GFR, EST NON AFRICAN AMERICAN: 50 mL/min — AB (ref >60)
GLUCOSE: 143 mg/dL — AB (ref 65–99)
Potassium: 4.5 mmol/L (ref 3.5–5.1)
SODIUM: 138 mmol/L (ref 135–145)

## 2016-10-08 MED ORDER — METOPROLOL SUCCINATE ER 25 MG PO TB24
12.5000 mg | ORAL_TABLET | Freq: Every day | ORAL | Status: DC
  Administered 2016-10-08: 12.5 mg via ORAL
  Filled 2016-10-08: qty 1

## 2016-10-08 MED ORDER — AMIODARONE HCL 100 MG PO TABS
200.0000 mg | ORAL_TABLET | Freq: Every day | ORAL | Status: DC
  Administered 2016-10-08: 200 mg via ORAL
  Filled 2016-10-08: qty 2

## 2016-10-08 MED ORDER — INFLUENZA VAC SPLIT HIGH-DOSE 0.5 ML IM SUSY
0.5000 mL | PREFILLED_SYRINGE | INTRAMUSCULAR | Status: AC
  Administered 2016-10-09: 0.5 mL via INTRAMUSCULAR
  Filled 2016-10-08: qty 0.5

## 2016-10-08 NOTE — Progress Notes (Signed)
Subjective: 1 Day Post-Op Procedure(s) (LRB): TOTAL [name] ARTHROPLASTY (Left) Patient reports pain as moderate.  Doing ok this am.  A little lightheadedness yesterday, but has appeared to have resolved  Objective: Vital signs in last 24 hours: Temp:  [97.3 F (36.3 C)-98 F (36.7 C)] 97.5 F (36.4 C) (09/26 2037) Pulse Rate:  [54-70] 61 (09/26 2037) Resp:  [11-29] 16 (09/26 2037) BP: (100-181)/(44-89) 141/61 (09/26 2037) SpO2:  [90 %-100 %] 96 % (09/26 2037) Weight:  [106.6 kg (235 lb)-106.8 kg (235 lb 7.2 oz)] 106.8 kg (235 lb 7.2 oz) (09/26 1800)  Intake/Output from previous day: 09/26 0701 - 09/27 0700 In: 1085 [P.O.:120; I.V.:965] Out: 50 [Blood:50] Intake/Output this shift: No intake/output data recorded.  No results for input(s): HGB in the last 72 hours. No results for input(s): WBC, RBC, HCT, PLT in the last 72 hours. No results for input(s): NA, K, CL, CO2, BUN, CREATININE, GLUCOSE, CALCIUM in the last 72 hours.  Recent Labs  10/07/16 0948  INR 0.91    Neurologically intact Neurovascular intact Sensation intact distally Intact pulses distally Dorsiflexion/Plantar flexion intact Incision: dressing C/D/I No cellulitis present Compartment soft  Assessment/Plan: 1 Day Post-Op Procedure(s) (LRB): TOTAL [name] ARTHROPLASTY (Left) Advance diet Up with therapy Discharge home with home health today as long as she mobilizes well with PT and feels comfortable going home WBAT LLE PLEASE REMOVE ACE BANDAGE AND APPLY TED HOSE TO BLE TODAY Will keep, but decrease fluids d/t poor renal function Dry dressing change prn  [name] [name] 10/08/2016, 6:48 AM

## 2016-10-08 NOTE — Op Note (Signed)
NAMEPADME, ARRIAGA NO.:  [id number]  MEDICAL RECORD NO.:  [id number]  LOCATION:                                 FACILITY:  PHYSICIAN:  [name], M.D.      DATE OF BIRTH:  DATE OF PROCEDURE:  10/07/2016 DATE OF DISCHARGE:                              OPERATIVE REPORT   PREOPERATIVE DIAGNOSIS:  Left knee end-stage arthritis, primary localized.  Varus alignment.  POSTOPERATIVE DIAGNOSIS:  Left knee end-stage arthritis, primary localized.  Varus alignment.  PROCEDURES:  Modified minimally invasive left total knee replacement with Stryker Triathlon prosthesis.  All components press-fit.  Pegged cruciate retaining #4 femoral component.  #4 tibial component, 9-mm CS insert.  Resurfacing 35 mm patellar component.  SURGEON:  [name], M.D.  ASSISTANT:  Elmyra Ricks, PA, present throughout the entire case and necessary for timely completion of procedure.  ANESTHESIA:  Spinal.  ESTIMATED BLOOD LOSS:  Minimal.  SPECIMENS:  None.  CULTURES:  None.  COMPLICATION:  None.  DRESSINGS:  Soft compressive.  TOURNIQUET TIME:  45 minutes.  DESCRIPTION OF PROCEDURE:  The patient was brought to the operating room and after adequate anesthesia had been obtained, tourniquet applied. Prepped and draped in usual sterile fashion.  Exsanguinated with elevation of Esmarch.  Tourniquet inflated to 350 mmHg.  A longitudinal incision above the patella down the tibial tubercle.  Medial arthrotomy, vastus splitting.  Flexible intramedullary guide distal femur.  8 mm resection.  5 degrees of valgus.  Using epicondylar axis, the femur was sized, cut, and fitted for a pegged cruciate-retaining #4 component. Excellent bone stock.  Extramedullary guide, proximal tibial resection. Size #4 component.  Rotation set with trials.  Hand reamed.  Patella exposed.  Posterior 10 mm removed.  Drilled, sized, and fitted for a 35- mm component.  All recess was examined  and knee copiously irrigated. All components well seated.  Polyethylene attached to tibia, knee reduced.  Patella was put in place.  At completion, excellent biomechanical axis.  Full motion.  Nicely balanced knee, good stability. Good patellar tracking.  Injected with Exparel.  Wound irrigated just prior to that.  Arthrotomy closed with #1 Vicryl with the subcutaneous and subcuticular closure. Sterile compressive dressing applied.  Tourniquet deflated and removed. Knee immobilizer applied.  Anesthesia reversed.  Brought to the recovery room.  Tolerated the surgery well.  No complications.     [name], M.D.   ______________________________ [name], M.D.    DFM/MEDQ  D:  10/07/2016  T:  10/07/2016  Job:  [id number]

## 2016-10-08 NOTE — Evaluation (Signed)
Occupational Therapy Evaluation Patient Details Name: [name] [name] MRN: [id number] DOB: [birth year] Today's Date: 10/08/2016    History of Present Illness 71 yo admitted for Left TKA. PMHx: Afib, HTN, Heart failure   Clinical Impression   Pt reports she was independent with ADL PTA. Currently pt requires min guard assist for ADL and functional mobility with the exception of min assist for LB ADL. Pt with difficulty keeping eyes open throughout session (?medication related) but husband present and verbalized understanding of education provided. Pt planning to d/c home with 24/7 supervision from her husband. Pt would benefit from continued skilled OT to address established goals.    Follow Up Recommendations  No OT follow up;Supervision/Assistance - 24 hour    Equipment Recommendations  None recommended by OT    Recommendations for Other Services       Precautions / Restrictions Precautions Precautions: Knee Precaution Booklet Issued: No Restrictions Weight Bearing Restrictions: Yes LLE Weight Bearing: Weight bearing as tolerated      Mobility Bed Mobility              General bed mobility comments: Pt OOB in chair upon arrival  Transfers Overall transfer level: Needs assistance Equipment used: Rolling Zanders (2 wheeled) Transfers: Sit to/from Stand Sit to Stand: Min guard         General transfer comment: for safety. Good hand placement and technique    Balance Overall balance assessment: Needs assistance Sitting-balance support: Feet supported;No upper extremity supported Sitting balance-Leahy Scale: Good     Standing balance support: No upper extremity supported;During functional activity Standing balance-Leahy Scale: Fair Standing balance comment: static standing at sink without UE support                           ADL either performed or assessed with clinical judgement   ADL Overall ADL's : Needs assistance/impaired Eating/Feeding: Set  up;Sitting   Grooming: Min guard;Standing;Wash/dry hands   Upper Body Bathing: Set up;Supervision/ safety;Sitting   Lower Body Bathing: Minimal assistance;Sit to/from stand   Upper Body Dressing : Set up;Supervision/safety;Sitting   Lower Body Dressing: Minimal assistance;Sit to/from stand Lower Body Dressing Details (indicate cue type and reason): Educated pt and husband on L leg into clothing first. Husband to assist as needed Toilet Transfer: Min guard;Ambulation;BSC;RW   Toileting- Water quality scientist and Hygiene: Min guard;Sit to/from stand Toileting - Clothing Manipulation Details (indicate cue type and reason): for peri care   Tub/Shower Transfer Details (indicate cue type and reason): Educated on walk in shower transfer technique with RW and shower seat. Functional mobility during ADLs: Min guard;Rolling Foot       Vision         Perception     Praxis      Pertinent Vitals/Pain Pain Assessment: 0-10 Pain Score: 2  Pain Location: L knee Pain Descriptors / Indicators: Sore Pain Intervention(s): Monitored during session;Ice applied     Hand Dominance     Extremity/Trunk Assessment Upper Extremity Assessment Upper Extremity Assessment: Overall WFL for tasks assessed   Lower Extremity Assessment Lower Extremity Assessment: Defer to PT evaluation LLE Deficits / Details: decreased ROM and strength as expected post op   Cervical / Trunk Assessment Cervical / Trunk Assessment: Normal   Communication Communication Communication: No difficulties   Cognition Arousal/Alertness: Lethargic;Suspect due to medications Behavior During Therapy: [facility] for tasks assessed/performed Overall Cognitive Status: Within Functional Limits for tasks assessed  General Comments       Exercises    Shoulder Instructions      Home Living Family/patient expects to be discharged to:: Private residence Living Arrangements:  Spouse/significant other Available Help at Discharge: Family;Available 24 hours/day Type of Home: House Home Access: Stairs to enter [employer] of Steps: 2 Entrance Stairs-Rails: None Home Layout: Two level;Able to live on main level with bedroom/bathroom     Bathroom Shower/Tub: Walk-in shower   Bathroom Toilet: Handicapped height     Home Equipment: Environmental consultant - 2 wheels;Bedside commode;Other (comment);Shower seat (adjustable bed)          Prior Functioning/Environment Level of Independence: Independent                 OT Problem List: Decreased range of motion;Impaired balance (sitting and/or standing);Decreased knowledge of precautions;Decreased knowledge of use of DME or AE;Pain      OT Treatment/Interventions: Self-care/ADL training;Energy conservation;DME and/or AE instruction;Therapeutic activities;Patient/family education;Balance training    OT Goals(Current goals can be found in the care plan section) Acute Rehab OT Goals Patient Stated Goal: return home and walk OT Goal Formulation: With patient/family Time For Goal Achievement: 10/22/16 Potential to Achieve Goals: Good ADL Goals Pt Will Perform Lower Body Bathing: with supervision;sit to/from stand Pt Will Perform Lower Body Dressing: with supervision;sit to/from stand Pt Will Perform Tub/Shower Transfer: Shower transfer;with supervision;ambulating;shower seat;rolling Faust  OT Frequency: Min 2X/week   Barriers to D/C:            Co-evaluation              AM-PAC PT "6 Clicks" Daily Activity     Outcome Measure Help from another person eating meals?: None Help from another person taking care of personal grooming?: A Little Help from another person toileting, which includes using toliet, bedpan, or urinal?: A Little Help from another person bathing (including washing, rinsing, drying)?: A Little Help from another person to put on and taking off regular upper body clothing?: A  Little Help from another person to put on and taking off regular lower body clothing?: A Little 6 Click Score: 19   End of Session Equipment Utilized During Treatment: Rolling Mancias CPM Left Knee CPM Left Knee: Off Nurse Communication: Mobility status;Other (comment) Research scientist (life sciences))  Activity Tolerance: Patient tolerated treatment well;Patient limited by lethargy Patient left: in chair;with call bell/phone within reach;with family/visitor present  OT Visit Diagnosis: Other abnormalities of gait and mobility (R26.89);Pain Pain - Right/Left: Left Pain - part of body: Knee                Time: 5631-4970 OT Time Calculation (min): 23 min Charges:  OT General Charges $OT Visit: 1 Visit OT Evaluation $OT Eval Moderate Complexity: 1 Mod OT Treatments $Self Care/Home Management : 8-22 mins G-Codes:     Marilou Barnfield A. Ulice Brilliant, M.S., OTR/L Pager: [place] 10/08/2016, 10:43 AM

## 2016-10-08 NOTE — Progress Notes (Signed)
Orthopedic Tech Progress Note Patient Details:  [name] [name] 09/22/1945 [id number]  CPM Left Knee CPM Left Knee: On Left Knee Flexion (Degrees): 60 Left Knee Extension (Degrees): 0 Additional Comments:  (Bone foam on)   [name] 10/08/2016, 3:12 PM

## 2016-10-08 NOTE — Discharge Instructions (Addendum)
INSTRUCTIONS AFTER JOINT REPLACEMENT  ° °o Remove items at home which could result in a fall. This includes throw rugs or furniture in walking pathways °o ICE to the affected joint every three hours while awake for 30 minutes at a time, for at least the first 3-5 days, and then as needed for pain and swelling.  Continue to use ice for pain and swelling. You may notice swelling that will progress down to the foot and ankle.  This is normal after surgery.  Elevate your leg when you are not up walking on it.   °o Continue to use the breathing machine you got in the hospital (incentive spirometer) which will help keep your temperature down.  It is common for your temperature to cycle up and down following surgery, especially at night when you are not up moving around and exerting yourself.  The breathing machine keeps your lungs expanded and your temperature down. ° ° °DIET:  As you were doing prior to hospitalization, we recommend a well-balanced diet. ° °DRESSING / WOUND CARE / SHOWERING ° °Keep the surgical dressing until follow up.  The dressing is water proof, so you can shower without any extra covering.  IF THE DRESSING FALLS OFF or the wound gets wet inside, change the dressing with sterile gauze.  Please use good hand washing techniques before changing the dressing.  Do not use any lotions or creams on the incision until instructed by your surgeon.   ° °ACTIVITY ° °o Increase activity slowly as tolerated, but follow the weight bearing instructions below.   °o No driving for 6 weeks or until further direction given by your physician.  You cannot drive while taking narcotics.  °o No lifting or carrying greater than 10 lbs. until further directed by your surgeon. °o Avoid periods of inactivity such as sitting longer than an hour when not asleep. This helps prevent blood clots.  °o You may return to work once you are authorized by your doctor.  ° ° ° °WEIGHT BEARING  ° °Weight bearing as tolerated with assist  device (Allmendinger, cane, etc) as directed, use it as long as suggested by your surgeon or therapist, typically at least 4-6 weeks. ° ° °EXERCISES ° °Results after joint replacement surgery are often greatly improved when you follow the exercise, range of motion and muscle strengthening exercises prescribed by your doctor. Safety measures are also important to protect the joint from further injury. Any time any of these exercises cause you to have increased pain or swelling, decrease what you are doing until you are comfortable again and then slowly increase them. If you have problems or questions, call your caregiver or physical therapist for advice.  ° °Rehabilitation is important following a joint replacement. After just a few days of immobilization, the muscles of the leg can become weakened and shrink (atrophy).  These exercises are designed to build up the tone and strength of the thigh and leg muscles and to improve motion. Often times heat used for twenty to thirty minutes before working out will loosen up your tissues and help with improving the range of motion but do not use heat for the first two weeks following surgery (sometimes heat can increase post-operative swelling).  ° °These exercises can be done on a training (exercise) mat, on the floor, on a table or on a bed. Use whatever works the best and is most comfortable for you.    Use music or television while you are exercising so that   the exercises are a pleasant break in your day. This will make your life better with the exercises acting as a break in your routine that you can look forward to.   Perform all exercises about fifteen times, three times per day or as directed.  You should exercise both the operative leg and the other leg as well. ° °Exercises include: °  °• Quad Sets - Tighten up the muscle on the front of the thigh (Quad) and hold for 5-10 seconds.   °• Straight Leg Raises - With your knee straight (if you were given a brace, keep it on),  lift the leg to 60 degrees, hold for 3 seconds, and slowly lower the leg.  Perform this exercise against resistance later as your leg gets stronger.  °• Leg Slides: Lying on your back, slowly slide your foot toward your buttocks, bending your knee up off the floor (only go as far as is comfortable). Then slowly slide your foot back down until your leg is flat on the floor again.  °• Angel Wings: Lying on your back spread your legs to the side as far apart as you can without causing discomfort.  °• Hamstring Strength:  Lying on your back, push your heel against the floor with your leg straight by tightening up the muscles of your buttocks.  Repeat, but this time bend your knee to a comfortable angle, and push your heel against the floor.  You may put a pillow under the heel to make it more comfortable if necessary.  ° °A rehabilitation program following joint replacement surgery can speed recovery and prevent re-injury in the future due to weakened muscles. Contact your doctor or a physical therapist for more information on knee rehabilitation.  ° ° °CONSTIPATION ° °Constipation is defined medically as fewer than three stools per week and severe constipation as less than one stool per week.  Even if you have a regular bowel pattern at home, your normal regimen is likely to be disrupted due to multiple reasons following surgery.  Combination of anesthesia, postoperative narcotics, change in appetite and fluid intake all can affect your bowels.  ° °YOU MUST use at least one of the following options; they are listed in order of increasing strength to get the job done.  They are all available over the counter, and you may need to use some, POSSIBLY even all of these options:   ° °Drink plenty of fluids (prune juice may be helpful) and high fiber foods °Colace 100 mg by mouth twice a day  °Senokot for constipation as directed and as needed Dulcolax (bisacodyl), take with full glass of water  °Miralax (polyethylene glycol)  once or twice a day as needed. ° °If you have tried all these things and are unable to have a bowel movement in the first 3-4 days after surgery call either your surgeon or your primary doctor.   ° °If you experience loose stools or diarrhea, hold the medications until you stool forms back up.  If your symptoms do not get better within 1 week or if they get worse, check with your doctor.  If you experience "the worst abdominal pain ever" or develop nausea or vomiting, please contact the office immediately for further recommendations for treatment. ° ° °ITCHING:  If you experience itching with your medications, try taking only a single pain pill, or even half a pain pill at a time.  You can also use Benadryl over the counter for itching or also to   help with sleep.   TED HOSE STOCKINGS:  Use stockings on both legs until for at least 2 weeks or as directed by physician office. They may be removed at night for sleeping.  MEDICATIONS:  See your medication summary on the After Visit Summary that nursing will review with you.  You may have some home medications which will be placed on hold until you complete the course of blood thinner medication.  It is important for you to complete the blood thinner medication as prescribed.  PRECAUTIONS:  If you experience chest pain or shortness of breath - call 911 immediately for transfer to the hospital emergency department.   If you develop a fever greater that 101 F, purulent drainage from wound, increased redness or drainage from wound, foul odor from the wound/dressing, or calf pain - CONTACT YOUR SURGEON.                                                   FOLLOW-UP APPOINTMENTS:  If you do not already have a post-op appointment, please call the office for an appointment to be seen by your surgeon.  Guidelines for how soon to be seen are listed in your After Visit Summary, but are typically between 1-4 weeks after surgery.  OTHER INSTRUCTIONS:   Knee  Replacement:  Do not place pillow under knee, focus on keeping the knee straight while resting. CPM instructions: 0-90 degrees, 2 hours in the morning, 2 hours in the afternoon, and 2 hours in the evening. Place foam block, curve side up under heel at all times except when in CPM or when walking.  DO NOT modify, tear, cut, or change the foam block in any way.  MAKE SURE YOU:   Understand these instructions.   Get help right away if you are not doing well or get worse.    Thank you for letting us be a part of your medical care team.  It is a privilege we respect greatly.  We hope these instructions will help you stay on track for a fast and full recovery!   Information on my medicine - ELIQUIS (apixaban)  This medication education was reviewed with me or my healthcare representative as part of my discharge preparation.  Why was Eliquis prescribed for you? Eliquis was prescribed for you for stroke prevention due to atrial fibrillation and to reduce the risk of blood clots forming after orthopedic surgery.    What do You need to know about Eliquis? Take your Eliquis TWICE DAILY - one tablet in the morning and one tablet in the evening with or without food.  It would be best to take the dose about the same time each day.  If you have difficulty swallowing the tablet whole please discuss with your pharmacist how to take the medication safely.  Take Eliquis exactly as prescribed by your doctor and DO NOT stop taking Eliquis without talking to the doctor who prescribed the medication.  Stopping without other medication to take the place of Eliquis may increase your risk of developing a clot.  After discharge, you should have regular check-up appointments with your healthcare provider that is prescribing your Eliquis.  What do you do if you miss a dose? If a dose of ELIQUIS is not taken at the scheduled time, take it as soon as possible on the same day  and twice-daily administration  should be resumed.  The dose should not be doubled to make up for a missed dose.  Do not take more than one tablet of ELIQUIS at the same time.  Important Safety Information A possible side effect of Eliquis is bleeding. You should call your healthcare provider right away if you experience any of the following: ? Bleeding from an injury or your nose that does not stop. ? Unusual colored urine (red or dark brown) or unusual colored stools (red or black). ? Unusual bruising for unknown reasons. ? A serious fall or if you hit your head (even if there is no bleeding).  Some medicines may interact with Eliquis and might increase your risk of bleeding or clotting while on Eliquis. To help avoid this, consult your healthcare provider or pharmacist prior to using any new prescription or non-prescription medications, including herbals, vitamins, non-steroidal anti-inflammatory drugs (NSAIDs) and supplements.  This website has more information on Eliquis (apixaban): http://www.eliquis.com/eliquis/home

## 2016-10-08 NOTE — Progress Notes (Addendum)
Physical Therapy Treatment Patient Details Name: [name] [name] MRN: [id number] DOB: 1945-07-10 Today's Date: 10/08/2016    History of Present Illness 71 yo admitted for Left TKA. PMHx: Afib, HTN, Heart failure    PT Comments    Pt pleasant and states she is still very tired and having difficulty keeping her eyes open at rest. Pt able to increase gait distance, HEP and flexion however fatigued before able to practice stairs. Pt encouraged to return to bed for CPM after lunch, continue to ambulate with nursing and perform HEP again today. Will continue to follow.    Follow Up Recommendations  Home health PT     Equipment Recommendations  None recommended by PT    Recommendations for Other Services OT consult     Precautions / Restrictions Precautions Precautions: Knee  Restrictions Weight Bearing Restrictions: Yes LLE Weight Bearing: Weight bearing as tolerated    Mobility  Bed Mobility            General bed mobility comments: in chair on arrival  Transfers Overall transfer level: Needs assistance Equipment used: Rolling Faison (2 wheeled) Transfers: Sit to/from Stand Sit to Stand: Supervision         General transfer comment: good hand placement  Ambulation/Gait Ambulation/Gait assistance: Min guard Ambulation Distance (Feet): 150 Feet Assistive device: Rolling Trudell (2 wheeled) Gait Pattern/deviations: Step-to pattern;Step-through pattern;Trunk flexed   Gait velocity interpretation: Below normal speed for age/gender General Gait Details: pt initially able to perform step-through pattern with fatigue decreased to step-to pattern with cues for heel strike, step-through and posture   Stairs Stairs:  (pt declined attempting due to fatigue)          Wheelchair Mobility    Modified Rankin (Stroke Patients Only)       Balance Overall balance assessment: No apparent balance deficits (not formally assessed)                                    Cognition Arousal/Alertness: Awake/alert Behavior During Therapy: WFL for tasks assessed/performed Overall Cognitive Status: Within Functional Limits for tasks assessed                                        Exercises Total Joint Exercises Hip ABduction/ADduction: AROM;Left;10 reps;Seated Straight Leg Raises: Left;10 reps;AROM;Seated Long Arc Quad: AROM;Left;Seated;10 reps Goniometric ROM: 5-65 Marching in Standing: AROM;Left;Seated;10 reps    General Comments        Pertinent Vitals/Pain Pain Assessment: 0-10 Pain Score: 8  Pain Location: L knee Pain Descriptors / Indicators: Sore Pain Intervention(s): Limited activity within patient's tolerance;Repositioned;Ice applied;Patient requesting pain meds-RN notified;Monitored during session    Home Living Family/patient expects to be discharged to:: Private residence Living Arrangements: Spouse/significant other Available Help at Discharge: Family;Available 24 hours/day Type of Home: House Home Access: Stairs to enter Entrance Stairs-Rails: None Home Layout: Two level;Able to live on main level with bedroom/bathroom Home Equipment: Gilford Rile - 2 wheels;Bedside commode;Other (comment);Shower seat (adjustable bed)      Prior Function Level of Independence: Independent          PT Goals (current goals can now be found in the care plan section) Acute Rehab PT Goals Patient Stated Goal: return home and walk PT Goal Formulation: With patient/family Time For Goal Achievement: 10/15/16 Potential to Achieve Goals: Good Progress towards  PT goals: Progressing toward goals    Frequency    7X/week      PT Plan Current plan remains appropriate    Co-evaluation              AM-PAC PT "6 Clicks" Daily Activity  Outcome Measure  Difficulty turning over in bed (including adjusting bedclothes, sheets and blankets)?: A Little Difficulty moving from lying on back to sitting on the side of the bed? :  A Little Difficulty sitting down on and standing up from a chair with arms (e.g., wheelchair, bedside commode, etc,.)?: A Little Help needed moving to and from a bed to chair (including a wheelchair)?: A Little Help needed walking in hospital room?: A Little Help needed climbing 3-5 steps with a railing? : A Little 6 Click Score: 18    End of Session Equipment Utilized During Treatment: Gait belt Activity Tolerance: Patient tolerated treatment well Patient left: in chair;with call bell/phone within reach;with family/visitor present;Other (comment) (in heel foam) Nurse Communication: Mobility status PT Visit Diagnosis: Other abnormalities of gait and mobility (R26.89);Difficulty in walking, not elsewhere classified (R26.2);Pain Pain - Right/Left: Left Pain - part of body: Knee     Time: 5188-4166 PT Time Calculation (min) (ACUTE ONLY): 21 min  Charges:  $Gait Training: 8-22 mins                    G Codes:       Elwyn Reach, PT [phone]   [place] 10/08/2016, 11:48 AM

## 2016-10-08 NOTE — Progress Notes (Signed)
[name] [name] notified that the patient is not comfortable being discharged today.

## 2016-10-08 NOTE — Evaluation (Signed)
Physical Therapy Evaluation Patient Details Name: [name] [name] MRN: [id number] DOB: Dec 08, 1945 Today's Date: 10/08/2016   History of Present Illness  71 yo admitted for Left TKA. PMHx: Afib, HTN, Heart failure  Clinical Impression  Pt pleasant and able to mobilize but limited by fatigue and lightheadedness. Pt educated for ROM, HEP, transfers, gait, CPM use and function. Pt slept in heel foam and did not reapply end of session but encouraged use throughout the day. Pt with decreased strength, ROM, transfers and function who will benefit from acute therapy to maximize ROM, gait, and independence for return to PLOF.     Follow Up Recommendations Home health PT    Equipment Recommendations  None recommended by PT    Recommendations for Other Services OT consult     Precautions / Restrictions Precautions Precautions: Knee Restrictions Weight Bearing Restrictions: Yes LLE Weight Bearing: Weight bearing as tolerated      Mobility  Bed Mobility Overal bed mobility: Modified Independent             General bed mobility comments: with rail and HOB 20 degrees- adjustable bed at home  Transfers Overall transfer level: Needs assistance   Transfers: Sit to/from Stand Sit to Stand: Min guard         General transfer comment: cues for hand placement and safety  Ambulation/Gait Ambulation/Gait assistance: Min guard Ambulation Distance (Feet): 30 Feet Assistive device: Rolling Bauch (2 wheeled) Gait Pattern/deviations: Step-to pattern;Trunk flexed;Decreased stance time - left   Gait velocity interpretation: Below normal speed for age/gender General Gait Details: cues for sequence, heel strike, posture and step through pattern  Stairs            Wheelchair Mobility    Modified Rankin (Stroke Patients Only)       Balance Overall balance assessment: Needs assistance                                           Pertinent Vitals/Pain Pain  Assessment: 0-10 Pain Score: 5  Pain Location: left knee Pain Descriptors / Indicators: Aching Pain Intervention(s): Limited activity within patient's tolerance;RN gave pain meds during session;Repositioned;Ice applied    Home Living Family/patient expects to be discharged to:: Private residence Living Arrangements: Spouse/significant other Available Help at Discharge: Family;Available 24 hours/day Type of Home: House Home Access: Stairs to enter Entrance Stairs-Rails: None Entrance Stairs-Number of Steps: 2 Home Layout: Two level;Able to live on main level with bedroom/bathroom;Laundry or work area in [place]: Environmental consultant - 2 wheels;Bedside commode;Grab bars - tub/shower;Other (comment) (adjustable bed)      Prior Function Level of Independence: Independent               Hand Dominance        Extremity/Trunk Assessment   Upper Extremity Assessment Upper Extremity Assessment: Overall WFL for tasks assessed    Lower Extremity Assessment Lower Extremity Assessment: LLE deficits/detail LLE Deficits / Details: decreased ROM and strength as expected post op    Cervical / Trunk Assessment Cervical / Trunk Assessment: Normal  Communication   Communication: No difficulties  Cognition Arousal/Alertness: Awake/alert Behavior During Therapy: WFL for tasks assessed/performed Overall Cognitive Status: Within Functional Limits for tasks assessed  General Comments      Exercises Total Joint Exercises Quad Sets: AROM;Left;Supine;5 reps Heel Slides: AAROM;Left;Supine;10 reps Hip ABduction/ADduction: AROM;Left;Supine;10 reps Straight Leg Raises: AAROM;Left;Supine;5 reps   Assessment/Plan    PT Assessment Patient needs continued PT services  PT Problem List Decreased strength;Decreased mobility;Decreased range of motion;Decreased activity tolerance;Decreased knowledge of use of DME;Pain       PT  Treatment Interventions DME instruction;Therapeutic activities;Gait training;Therapeutic exercise;Stair training;Functional mobility training;Patient/family education    PT Goals (Current goals can be found in the Care Plan section)  Acute Rehab PT Goals Patient Stated Goal: return home and walk PT Goal Formulation: With patient/family Time For Goal Achievement: 10/15/16 Potential to Achieve Goals: Good    Frequency 7X/week   Barriers to discharge        Co-evaluation               AM-PAC PT "6 Clicks" Daily Activity  Outcome Measure Difficulty turning over in bed (including adjusting bedclothes, sheets and blankets)?: A Little Difficulty moving from lying on back to sitting on the side of the bed? : A Lot Difficulty sitting down on and standing up from a chair with arms (e.g., wheelchair, bedside commode, etc,.)?: A Little Help needed moving to and from a bed to chair (including a wheelchair)?: A Little Help needed walking in hospital room?: A Little Help needed climbing 3-5 steps with a railing? : A Little 6 Click Score: 17    End of Session Equipment Utilized During Treatment: Gait belt Activity Tolerance: Patient tolerated treatment well Patient left: in chair;with call bell/phone within reach;with family/visitor present Nurse Communication: Mobility status PT Visit Diagnosis: Other abnormalities of gait and mobility (R26.89);Difficulty in walking, not elsewhere classified (R26.2);Pain Pain - Right/Left: Left Pain - part of body: Knee    Time: 0728-0801 PT Time Calculation (min) (ACUTE ONLY): 33 min   Charges:   PT Evaluation $PT Eval Moderate Complexity: 1 Mod PT Treatments $Gait Training: 8-22 mins   PT G Codes:        Elwyn Reach, PT [phone]   [place] 10/08/2016, 8:43 AM

## 2016-10-08 NOTE — Progress Notes (Signed)
Patient ID: [name] [name], female   DOB: 1945/10/08, 71 y.o.   MRN: [id number]   Patient did not mobilize as well as I had hoped with physical therapy due to lightheadedness/dizziness and pain for which she required additional pain meds.  Patient will need to stay an additional night due to the above issues.  Mendel Ryder

## 2016-10-08 NOTE — Care Management Note (Signed)
Case Management Note  Patient Details  Name: WARREN KUGELMAN MRN: [id number] Date of Birth: [birth year]  Subjective/Objective:     71 yr old female s/p left total knee arthroplasty.               Action/Plan: Case manager spoke with patient and husband concerning discharge plan and DME. Patient was preoperatively setup with [facility], no changes. She has RW and 3in1, CPM has been delivered to her home. Patient will have family support at discharge.   Expected Discharge Date:  10/09/16               Expected Discharge Plan:  [place]  In-House Referral:  NA  Discharge planning Services  CM Consult  Post Acute Care Choice:  Durable Medical Equipment, Home Health Choice offered to:  Patient  DME Arranged:  CPM (has RW and 3in1) DME Agency:  [employer]  HH Arranged:  PT HH Agency:  [facility] (formerly [employer])  Status of Service:  In process, will continue to follow  If discussed at Long Length of Stay Meetings, dates discussed:    Additional Comments:  Ninfa Meeker, RN 10/08/2016, 3:30 PM

## 2016-10-09 ENCOUNTER — Ambulatory Visit: Payer: Medicare Other | Admitting: Internal Medicine

## 2016-10-09 LAB — BASIC METABOLIC PANEL
ANION GAP: 5 (ref 5–15)
BUN: 15 mg/dL (ref 6–20)
CO2: 28 mmol/L (ref 22–32)
Calcium: 8.8 mg/dL — ABNORMAL LOW (ref 8.9–10.3)
Chloride: 103 mmol/L (ref 101–111)
Creatinine, Ser: 1.23 mg/dL — ABNORMAL HIGH (ref 0.44–1.00)
GFR, EST AFRICAN AMERICAN: 50 mL/min — AB (ref >60)
GFR, EST NON AFRICAN AMERICAN: 43 mL/min — AB (ref >60)
GLUCOSE: 118 mg/dL — AB (ref 65–99)
POTASSIUM: 4.4 mmol/L (ref 3.5–5.1)
Sodium: 136 mmol/L (ref 135–145)

## 2016-10-09 LAB — CBC
HEMATOCRIT: 34.8 % — AB (ref 36.0–46.0)
Hemoglobin: 11 g/dL — ABNORMAL LOW (ref 12.0–15.0)
MCH: 30.6 pg (ref 26.0–34.0)
MCHC: 31.6 g/dL (ref 30.0–36.0)
MCV: 96.7 fL (ref 78.0–100.0)
PLATELETS: 157 10*3/uL (ref 150–400)
RBC: 3.6 MIL/uL — AB (ref 3.87–5.11)
RDW: 13.8 % (ref 11.5–15.5)
WBC: 11.3 10*3/uL — AB (ref 4.0–10.5)

## 2016-10-09 NOTE — Progress Notes (Signed)
Physical Therapy Treatment Patient Details Name: [name] [name] MRN: [id number] DOB: October 28, 1945 Today's Date: 10/09/2016    History of Present Illness 71 yo admitted for Left TKA. PMHx: Afib, HTN, Heart failure    PT Comments    Pt with increased step through pattern and decreased pain this session. Pt eager to return home and mobilizing well , safe for D/C. Discussed with spouse sequence for stairs and cues for gait.    Follow Up Recommendations  Home health PT     Equipment Recommendations  None recommended by PT    Recommendations for Other Services       Precautions / Restrictions Precautions Precautions: Knee Restrictions Weight Bearing Restrictions: Yes LLE Weight Bearing: Weight bearing as tolerated    Mobility  Bed Mobility Overal bed mobility: Modified Independent                Transfers Overall transfer level: Needs assistance Equipment used: Rolling Sonn (2 wheeled) Transfers: Sit to/from Stand Sit to Stand: Modified independent (Device/Increase time)         General transfer comment: stand to sit with supervision for controlled descent and left knee flexion with sitting  Ambulation/Gait Ambulation/Gait assistance: Supervision Ambulation Distance (Feet): 200 Feet Assistive device: Rolling Heyne (2 wheeled) Gait Pattern/deviations: Step-through pattern;Trunk flexed   Gait velocity interpretation: Below normal speed for age/gender General Gait Details: cues for posture and continued step-through pattern    Stairs            Wheelchair Mobility    Modified Rankin (Stroke Patients Only)       Balance                                            Cognition Arousal/Alertness: Awake/alert Behavior During Therapy: WFL for tasks assessed/performed Overall Cognitive Status: Within Functional Limits for tasks assessed                                        Exercises Total Joint Exercises Heel  Slides: AAROM;Left;Supine;10 reps Hip ABduction/ADduction: AROM;Left;Supine;15 reps Straight Leg Raises: Left;AROM;Supine;15 reps    General Comments        Pertinent Vitals/Pain Pain Assessment: 0-10 Pain Score: 4  Pain Location: L knee Pain Descriptors / Indicators: Sore Pain Intervention(s): Limited activity within patient's tolerance;Repositioned    Home Living                      Prior Function            PT Goals (current goals can now be found in the care plan section) Acute Rehab PT Goals Patient Stated Goal: return home and walk Progress towards PT goals: Progressing toward goals    Frequency           PT Plan Current plan remains appropriate    Co-evaluation              AM-PAC PT "6 Clicks" Daily Activity  Outcome Measure  Difficulty turning over in bed (including adjusting bedclothes, sheets and blankets)?: None Difficulty moving from lying on back to sitting on the side of the bed? : None Difficulty sitting down on and standing up from a chair with arms (e.g., wheelchair, bedside commode, etc,.)?: A Little Help needed moving to and  from a bed to chair (including a wheelchair)?: None Help needed walking in hospital room?: A Little Help needed climbing 3-5 steps with a railing? : A Little 6 Click Score: 21    End of Session   Activity Tolerance: Patient tolerated treatment well Patient left: in chair;with call bell/phone within reach;with family/visitor present Nurse Communication: Mobility status PT Visit Diagnosis: Other abnormalities of gait and mobility (R26.89);Difficulty in walking, not elsewhere classified (R26.2);Pain Pain - Right/Left: Left Pain - part of body: Knee     Time: 1153-1206 PT Time Calculation (min) (ACUTE ONLY): 13 min  Charges:  $Gait Training: 8-22 mins                    G Codes:       Elwyn Reach, PT [phone]    [place] 10/09/2016, 12:17 PM

## 2016-10-09 NOTE — Progress Notes (Signed)
Subjective: 2 Days Post-Op Procedure(s) (LRB): TOTAL KNEE ARTHROPLASTY (Left) Patient reports pain as mild.  Up walking with PT.  Notes feeling somewhat hot.  Also mentioned what sounds like sural nerve irritation that has improved with walking with PT  Objective: Vital signs in last 24 hours: Temp:  [98.2 F (36.8 C)-98.7 F (37.1 C)] 98.2 F (36.8 C) (09/28 0500) Pulse Rate:  [69-83] 79 (09/28 0500) Resp:  [17] 17 (09/28 0500) BP: (134-142)/(47-71) 134/71 (09/28 0500) SpO2:  [96 %-98 %] 98 % (09/28 0500)  Intake/Output from previous day: 09/27 0701 - 09/28 0700 In: 1200 [P.O.:1200] Out: -  Intake/Output this shift: No intake/output data recorded.   Recent Labs  10/08/16 0637 10/09/16 0611  HGB 11.9* 11.0*    Recent Labs  10/08/16 0637 10/09/16 0611  WBC 10.6* 11.3*  RBC 3.90 3.60*  HCT 37.1 34.8*  PLT 189 157    Recent Labs  10/08/16 0637  NA 138  K 4.5  CL 103  CO2 25  BUN 12  CREATININE 1.09*  GLUCOSE 143*  CALCIUM 8.7*    Recent Labs  10/07/16 0948  INR 0.91    Neurologically intact Neurovascular intact Sensation intact distally Intact pulses distally Dorsiflexion/Plantar flexion intact Incision: dressing C/D/I No cellulitis present Compartment soft  Assessment/Plan: 2 Days Post-Op Procedure(s) (LRB): TOTAL KNEE ARTHROPLASTY (Left) Advance diet Up with therapy Discharge home with home health after second session of PT WBAT LLE ABLA-mild and stable  Fannie Knee 10/09/2016, 7:53 AM

## 2016-10-09 NOTE — Progress Notes (Signed)
Occupational Therapy Treatment and Discharge Summary Patient Details Name: [name] [name] MRN: [id number] DOB: 01-21-1945 Today's Date: 10/09/2016    History of present illness 71 yo admitted for Left TKA. PMHx: Afib, HTN, Heart failure   OT comments  Pt progressing well s/p left TKA. Pt completed LB dressing and toileting tasks during session at supervision/setup level. Requires assist to don left sock but husband able to assist.  Pt plans to d/c home later today.  OT goals have been met. Will discharge from OT services.  Continue to recommend dc home with 24/7 supervision/assist from family.  Follow Up Recommendations  No OT follow up;Supervision/Assistance - 24 hour    Equipment Recommendations  None recommended by OT    Recommendations for Other Services      Precautions / Restrictions Precautions Precautions: Knee Restrictions Weight Bearing Restrictions: Yes LLE Weight Bearing: Weight bearing as tolerated       Mobility Bed Mobility Overal bed mobility: Modified Independent                Transfers Overall transfer level: Needs assistance Equipment used: Rolling Perfecto (2 wheeled) Transfers: Sit to/from Stand Sit to Stand: Supervision         General transfer comment: Independently demonstrating correct hand placement during transfers.    Balance                                           ADL either performed or assessed with clinical judgement   ADL Overall ADL's : Needs assistance/impaired                     Lower Body Dressing: Supervision/safety;Set up;Sit to/from stand (donning underwear and skirt)   Toilet Transfer: Supervision/safety;RW;Requires wide/bariatric   Toileting- Clothing Manipulation and Hygiene: Supervision/safety;Set up;Sit to/from stand   Tub/ Shower Transfer: Min guard;Ambulation;Rolling Wight   Functional mobility during ADLs: Supervision/safety;Rolling [place] General ADL Comments: Pt in bathroom  with NT upon OT arrival. Pt completed toileting, peri care and LB dressing at supervision level.  She does require assist for left sock but husband can assist with this.   Reviewed safe technique for shower transfer and use of shower seat.       Vision       Perception     Praxis      Cognition Arousal/Alertness: Awake/alert Behavior During Therapy: WFL for tasks assessed/performed Overall Cognitive Status: Within Functional Limits for tasks assessed                                          Exercises    Shoulder Instructions       General Comments      Pertinent Vitals/ Pain       Pain Assessment: 0-10 Pain Score: 6  Pain Location: L knee Pain Descriptors / Indicators: Sore Pain Intervention(s): Monitored during session;Ice applied  Home Living                                          Prior Functioning/Environment              Frequency  Min 2X/week  Progress Toward Goals  OT Goals(current goals can now be found in the care plan section)  Progress towards OT goals: Goals met and updated - see care plan  Acute Rehab OT Goals Patient Stated Goal: return home and walk OT Goal Formulation: With patient/family Time For Goal Achievement: 10/22/16 Potential to Achieve Goals: Good ADL Goals Pt Will Perform Lower Body Bathing: with supervision;sit to/from stand Pt Will Perform Lower Body Dressing: with supervision;sit to/from stand Pt Will Perform Tub/Shower Transfer: Shower transfer;with supervision;ambulating;shower seat;rolling [place]  Plan Discharge plan remains appropriate;All goals met and education completed, patient discharged from OT services    Co-evaluation                 AM-PAC PT "6 Clicks" Daily Activity     Outcome Measure   Help from another person eating meals?: None Help from another person taking care of personal grooming?: None Help from another person toileting, which includes using  toliet, bedpan, or urinal?: A Little Help from another person bathing (including washing, rinsing, drying)?: A Little Help from another person to put on and taking off regular upper body clothing?: None Help from another person to put on and taking off regular lower body clothing?: A Little 6 Click Score: 21    End of Session Equipment Utilized During Treatment: Rolling Arrick CPM Left Knee CPM Left Knee: Off  OT Visit Diagnosis: Other abnormalities of gait and mobility (R26.89);Pain Pain - Right/Left: Left Pain - part of body: Knee   Activity Tolerance Patient tolerated treatment well   Patient Left in bed;with call bell/phone within reach;with family/visitor present   Nurse Communication Mobility status        Time: 1002-1020 OT Time Calculation (min): 18 min  Charges: OT General Charges $OT Visit: 1 Visit OT Treatments $Self Care/Home Management : 8-22 mins    Darrol Jump OTR/L 10/09/2016, 10:42 AM

## 2016-10-09 NOTE — Progress Notes (Signed)
Pt discharge instructions reviewed with pt. Pt verbalizes understanding. Pt's husband is driving her home. Pt belongings with pt. Pt is not in distress.Pt discharged via wheelchair.

## 2016-10-09 NOTE — Progress Notes (Signed)
Physical Therapy Treatment Patient Details Name: [name] [name] MRN: [id number] DOB: 04/09/1945 Today's Date: 10/09/2016    History of Present Illness 71 yo admitted for Left TKA. PMHx: Afib, HTN, Heart failure    PT Comments    Pt progressing well with ability to increase ambulation distance and ROM as well as perform stairs. Will continue to follow. Pt slept in bone foam and encouraged use again after eating.    Follow Up Recommendations  Home health PT     Equipment Recommendations  None recommended by PT    Recommendations for Other Services       Precautions / Restrictions Precautions Precautions: Knee Restrictions LLE Weight Bearing: Weight bearing as tolerated    Mobility  Bed Mobility Overal bed mobility: Modified Independent                Transfers Overall transfer level: Needs assistance   Transfers: Sit to/from Stand Sit to Stand: Supervision         General transfer comment: cues for hand placement at commode but not from bed or to chair  Ambulation/Gait Ambulation/Gait assistance: Supervision Ambulation Distance (Feet): 200 Feet Assistive device: Rolling Piascik (2 wheeled) Gait Pattern/deviations: Step-to pattern;Trunk flexed   Gait velocity interpretation: Below normal speed for age/gender General Gait Details: cues for posture, position in RW, heel strike and step-through pattern   Stairs Stairs: Yes   Stair Management: Backwards;With [place] Number of Stairs: 2 General stair comments: x2 trials with cues for sequence  Wheelchair Mobility    Modified Rankin (Stroke Patients Only)       Balance                                            Cognition Arousal/Alertness: Awake/alert Behavior During Therapy: WFL for tasks assessed/performed Overall Cognitive Status: Within Functional Limits for tasks assessed                                        Exercises Total Joint Exercises Heel Slides:  AAROM;Left;Supine;10 reps Hip ABduction/ADduction: AROM;Left;10 reps;Supine Straight Leg Raises: Left;10 reps;AROM;Supine Long Arc Quad: AROM;Left;Seated;10 reps Goniometric ROM: 5-78    General Comments        Pertinent Vitals/Pain Pain Score: 6  Pain Location: L knee Pain Descriptors / Indicators: Sore Pain Intervention(s): Limited activity within patient's tolerance;Repositioned;Ice applied;Monitored during session;Premedicated before session    Home Living                      Prior Function            PT Goals (current goals can now be found in the care plan section) Progress towards PT goals: Progressing toward goals    Frequency           PT Plan Current plan remains appropriate    Co-evaluation              AM-PAC PT "6 Clicks" Daily Activity  Outcome Measure  Difficulty turning over in bed (including adjusting bedclothes, sheets and blankets)?: None Difficulty moving from lying on back to sitting on the side of the bed? : None Difficulty sitting down on and standing up from a chair with arms (e.g., wheelchair, bedside commode, etc,.)?: A Little Help needed moving to and from  a bed to chair (including a wheelchair)?: None Help needed walking in hospital room?: A Little Help needed climbing 3-5 steps with a railing? : A Little 6 Click Score: 21    End of Session Equipment Utilized During Treatment: Gait belt Activity Tolerance: Patient tolerated treatment well Patient left: in chair;with call bell/phone within reach;with family/visitor present Nurse Communication: Mobility status PT Visit Diagnosis: Other abnormalities of gait and mobility (R26.89);Difficulty in walking, not elsewhere classified (R26.2);Pain Pain - part of body: Knee     Time: 0726-0756 PT Time Calculation (min) (ACUTE ONLY): 30 min  Charges:  $Gait Training: 8-22 mins $Therapeutic Exercise: 8-22 mins                    G Codes:       Elwyn Reach,  PT [phone]   [place] 10/09/2016, 8:01 AM

## 2016-10-30 ENCOUNTER — Ambulatory Visit (INDEPENDENT_AMBULATORY_CARE_PROVIDER_SITE_OTHER): Payer: Medicare Other | Admitting: Internal Medicine

## 2016-10-30 ENCOUNTER — Encounter: Payer: Self-pay | Admitting: Internal Medicine

## 2016-10-30 VITALS — BP 128/62 | HR 61 | Resp 16 | Ht 67.0 in | Wt 231.0 lb

## 2016-10-30 DIAGNOSIS — R0600 Dyspnea, unspecified: Secondary | ICD-10-CM

## 2016-10-30 DIAGNOSIS — I4819 Other persistent atrial fibrillation: Secondary | ICD-10-CM

## 2016-10-30 DIAGNOSIS — L299 Pruritus, unspecified: Secondary | ICD-10-CM

## 2016-10-30 DIAGNOSIS — I481 Persistent atrial fibrillation: Secondary | ICD-10-CM | POA: Diagnosis not present

## 2016-10-30 DIAGNOSIS — R0609 Other forms of dyspnea: Secondary | ICD-10-CM | POA: Diagnosis not present

## 2016-10-30 LAB — COMPREHENSIVE METABOLIC PANEL
A/G RATIO: 1.5 (ref 1.2–2.2)
ALT: 12 IU/L (ref 0–32)
AST: 17 IU/L (ref 0–40)
Albumin: 3.9 g/dL (ref 3.5–4.8)
Alkaline Phosphatase: 97 IU/L (ref 39–117)
BILIRUBIN TOTAL: 0.4 mg/dL (ref 0.0–1.2)
BUN/Creatinine Ratio: 11 — ABNORMAL LOW (ref 12–28)
BUN: 15 mg/dL (ref 8–27)
CALCIUM: 9.8 mg/dL (ref 8.7–10.3)
CHLORIDE: 101 mmol/L (ref 96–106)
CO2: 27 mmol/L (ref 20–29)
Creatinine, Ser: 1.34 mg/dL — ABNORMAL HIGH (ref 0.57–1.00)
GFR calc non Af Amer: 40 mL/min/{1.73_m2} — ABNORMAL LOW (ref >59)
GFR, EST AFRICAN AMERICAN: 46 mL/min/{1.73_m2} — AB (ref >59)
GLOBULIN, TOTAL: 2.6 g/dL (ref 1.5–4.5)
Glucose: 125 mg/dL — ABNORMAL HIGH (ref 65–99)
POTASSIUM: 4.2 mmol/L (ref 3.5–5.2)
SODIUM: 144 mmol/L (ref 134–144)
Total Protein: 6.5 g/dL (ref 6.0–8.5)

## 2016-10-30 LAB — TSH: TSH: 4.06 u[IU]/mL (ref 0.450–4.500)

## 2016-10-30 LAB — T4, FREE: Free T4: 1.56 ng/dL (ref 0.82–1.77)

## 2016-10-30 NOTE — Progress Notes (Signed)
Follow-up Outpatient Visit Date: 10/30/2016  Primary Care Provider: Harlan Stains, MD [place] [address]  Chief Complaint: Follow-up atrial fibrillation  HPI:  Ms. Dworkin is a 71 y.o. year-old female with history of persistent atrial fibrillation, chronic kidney disease stage III, hypertension, hyperlipidemia, sleep apnea, and borderline diabetes, who presents for follow-up of atrial fibrillation.  I last saw her in late June, which time she was doing well with the exception of some exertional dyspnea.  We subsequently obtained a myocardial perfusion stress test that showed no evidence of ischemia or scar.  Today, Ms. Puffenbarger reports that her breathing is back to normal.  Her only complaint is of some lingering pain in the left knee following knee replacement last month.  She denies chest pain, palpitations, lightheadedness, orthopnea, and PND.  She still has mild left leg swelling following her knee surgery.  Ms. Maciolek also notes some itching when she feels hot.  She wonders if this could be due to one of her cardiac medications that was started after her presentation of atrial fibrillation last winter.  If she cools herself off, the itching resolves.  She does not have a rash..  --------------------------------------------------------------------------------------------------  Cardiovascular History & Procedures: Cardiovascular Problems:  Persistent atrial fibrillation  Risk Factors:  Hypertension, hyperlipidemia, and age greater than 41  Cath/PCI:  None  CV Surgery:  None  EP Procedures and Devices:  None  Non-Invasive Evaluation(s):  Pharmacologic MPI (07/07/16): Low risk study without ischemia or scar.  LVEF 64%.  TEE (02/24/16): LVEF depressed but difficult to assess. Trace AI. Moderate MR. Large LA without thrombus. Question small PFO. Normal RV size with depressed function. Mild TR.  TTE (02/18/16): Normal LV size and wall  thickness with EF 50-55%. Normal wall motion. MAC with mild MR. Moderate LA enlargement. Mildly dilated RV with normal contraction. Mild TR. Mildly elevated PA pressure.  Stress test (15-20 years ago): Normal per patient's report.  Recent CV Pertinent, Labs: Lab Results  Component Value Date   CHOL 125 02/18/2016   HDL 41 02/18/2016   LDLCALC 66 02/18/2016   TRIG 90 02/18/2016   CHOLHDL 3.0 02/18/2016   INR 0.91 10/07/2016   BNP 150.2 (H) 02/17/2016   K 4.4 10/09/2016   MG 1.7 02/17/2016   BUN 15 10/09/2016   BUN 15 07/06/2016   CREATININE 1.23 (H) 10/09/2016    Past medical and surgical history were reviewed and updated in EPIC.  Current Meds  Medication Sig  . amiodarone (PACERONE) 200 MG tablet Take 1 tablet (200 mg total) by mouth daily.  Marland Kitchen apixaban (ELIQUIS) 5 MG TABS tablet Take 1 tablet (5 mg total) by mouth 2 (two) times daily.  . Ascorbic Acid (VITAMIN C WITH ROSE HIPS) 1000 MG tablet Take 1,000 mg by mouth daily.  Marland Kitchen buPROPion (WELLBUTRIN XL) 300 MG 24 hr tablet Take 300 mg by mouth every morning.   Marland Kitchen CALCIUM PO Take 1 tablet by mouth every morning.  . Cholecalciferol (VITAMIN D) 2000 UNITS tablet Take 4,000 Units by mouth daily.  . CVS MELATONIN PO Take 1 tablet by mouth at bedtime as needed.  . fexofenadine (ALLEGRA) 180 MG tablet Take 180 mg by mouth daily as needed for allergies or rhinitis.  . furosemide (LASIX) 20 MG tablet TAKE 1 TABLET BY MOUTH ONCE DAILY  . metoprolol succinate (TOPROL XL) 25 MG 24 hr tablet Take 0.5 tablets (12.5 mg total) by mouth daily.  . ondansetron (ZOFRAN) 4 MG tablet  Take 1 tablet (4 mg total) by mouth every 8 (eight) hours as needed for nausea or vomiting.  Marland Kitchen oxyCODONE-acetaminophen (ROXICET) 5-325 MG tablet Take 1-2 tablets by mouth every 4 (four) hours as needed.  . potassium chloride SA (K-DUR,KLOR-CON) 20 MEQ tablet Take 1 tablet (20 mEq total) by mouth daily.  . pravastatin (PRAVACHOL) 40 MG tablet Take 40 mg by mouth at bedtime.     Allergies: Lexapro [escitalopram]; Peroxide [hydrogen peroxide]; Zoloft [sertraline hcl]; and Neosporin [neomycin-bacitracin zn-polymyx]  Social History   Social History  . Marital status: Married    Spouse name: N/A  . Number of children: N/A  . Years of education: N/A   Occupational History  . Not on file.   Social History Main Topics  . Smoking status: Never Smoker  . Smokeless tobacco: Never Used  . Alcohol use No  . Drug use: No  . Sexual activity: Yes    Birth control/ protection: Surgical   Other Topics Concern  . Not on file   Social History Narrative   ** Merged History Encounter **        Family History  Problem Relation Age of Onset  . Colon cancer Mother   . Heart disease Father     Review of Systems: A 12-system review of systems was performed and was negative except as noted in the HPI.  --------------------------------------------------------------------------------------------------  Physical Exam: BP 128/62   Pulse 61   Resp 16   Ht 5' 7"  (1.702 m)   Wt 231 lb (104.8 kg)   SpO2 98%   BMI 36.18 kg/m   General: Obese woman, seated comfortably in the exam room.  She is accompanied by her husband. HEENT: No conjunctival pallor or scleral icterus. Moist mucous membranes.  OP clear. Neck: Supple without lymphadenopathy, thyromegaly, JVD, or HJR. Lungs: Normal work of breathing. Clear to auscultation bilaterally without wheezes or crackles. Heart: Regular rate and rhythm without murmurs, rubs, or gallops.   Unable to assess PMI due to body habitus. Abd: Bowel sounds present. Soft, NT/ND unable to assess HSM due to body habitus. Ext: Trace left calf swelling with compression stocking in plac Radial, PT, and DP pulses are 2+ bilaterally. Skin: Warm and dry without rash.  EKG:  NSR without significant abnormalities.  Lab Results  Component Value Date   WBC 11.3 (H) 10/09/2016   HGB 11.0 (L) 10/09/2016   HCT 34.8 (L) 10/09/2016   MCV 96.7  10/09/2016   PLT 157 10/09/2016    Lab Results  Component Value Date   NA 136 10/09/2016   K 4.4 10/09/2016   CL 103 10/09/2016   CO2 28 10/09/2016   BUN 15 10/09/2016   CREATININE 1.23 (H) 10/09/2016   GLUCOSE 118 (H) 10/09/2016   ALT 10 (L) 02/17/2016    Lab Results  Component Value Date   CHOL 125 02/18/2016   HDL 41 02/18/2016   LDLCALC 66 02/18/2016   TRIG 90 02/18/2016   CHOLHDL 3.0 02/18/2016    --------------------------------------------------------------------------------------------------  ASSESSMENT AND PLAN: Persistent atrial fibrillation Ms. Matsuoka has not had any symptoms to suggest recurrent atrial fibrillation.  Other than itching when she feels warm, she seems to be tolerating her current medication regimen well.  We will continue her current doses of amiodarone, apixaban, and metoprolol.  Given planned long-term amiodarone therapy, I will check LFTs and thyroid function studies today.  We will also repeat pulmonary function studies including DLCO.  Dyspnea on exertion Symptoms have resolved.  Myocardial perfusion  stress test in June showed no evidence of ischemia or scar with normal LVEF.  No further workup.  Pruritus This seems to be predominantly when Ms. Secrest feels hot.  It could be an unusual medication side effect, though it would be challenging to figure out which agent is causing this.  I have asked her to continue to monitor her symptoms over the next few months.  If it remains a problem, we would need to consider individually withdrawing agents to see if this improves her symptoms.  Alternatively, dermatology consultation may be helpful.  Follow-up: Return to clinic in 6 months.  Nelva Bush, MD 10/30/2016 11:27 AM

## 2016-10-30 NOTE — Patient Instructions (Signed)
Medication Instructions:  Your physician recommends that you continue on your current medications as directed. Please refer to the Current Medication list given to you today.    Labwork: CMET/TSH/Free T4 today  Testing/Procedures: Your physician has recommended that you have a pulmonary function test. Pulmonary Function Tests are a group of tests that measure how well air moves in and out of your lungs.    Follow-Up: Your physician wants you to follow-up in: 6 months with Dr End. (April 2019). You will receive a reminder letter in the mail two months in advance. If you don't receive a letter, please call our office to schedule the follow-up appointment.        If you need a refill on your cardiac medications before your next appointment, please call your pharmacy.  s

## 2016-10-31 ENCOUNTER — Encounter: Payer: Self-pay | Admitting: Internal Medicine

## 2016-10-31 DIAGNOSIS — I4819 Other persistent atrial fibrillation: Secondary | ICD-10-CM | POA: Insufficient documentation

## 2016-10-31 DIAGNOSIS — L299 Pruritus, unspecified: Secondary | ICD-10-CM

## 2016-10-31 HISTORY — DX: Pruritus, unspecified: L29.9

## 2016-10-31 HISTORY — DX: Other persistent atrial fibrillation: I48.19

## 2016-11-04 ENCOUNTER — Ambulatory Visit (INDEPENDENT_AMBULATORY_CARE_PROVIDER_SITE_OTHER): Payer: Medicare Other | Admitting: Internal Medicine

## 2016-11-04 DIAGNOSIS — I481 Persistent atrial fibrillation: Secondary | ICD-10-CM | POA: Diagnosis not present

## 2016-11-04 DIAGNOSIS — I4819 Other persistent atrial fibrillation: Secondary | ICD-10-CM

## 2016-11-04 LAB — PULMONARY FUNCTION TEST
DL/VA % PRED: 87 %
DL/VA: 4.5 ml/min/mmHg/L
DLCO COR: 21.66 ml/min/mmHg
DLCO UNC % PRED: 71 %
DLCO UNC: 20.36 ml/min/mmHg
DLCO cor % pred: 76 %
FEF 25-75 Post: 2.49 L/sec
FEF 25-75 Pre: 1.76 L/sec
FEF2575-%Change-Post: 41 %
FEF2575-%Pred-Post: 121 %
FEF2575-%Pred-Pre: 85 %
FEV1-%CHANGE-POST: 9 %
FEV1-%PRED-POST: 88 %
FEV1-%PRED-PRE: 81 %
FEV1-POST: 2.25 L
FEV1-PRE: 2.06 L
FEV1FVC-%Change-Post: 3 %
FEV1FVC-%Pred-Pre: 102 %
FEV6-%Change-Post: 5 %
FEV6-%PRED-POST: 87 %
FEV6-%Pred-Pre: 82 %
FEV6-POST: 2.8 L
FEV6-PRE: 2.64 L
FEV6FVC-%PRED-PRE: 104 %
FEV6FVC-%Pred-Post: 104 %
FVC-%Change-Post: 5 %
FVC-%PRED-PRE: 78 %
FVC-%Pred-Post: 83 %
FVC-POST: 2.8 L
FVC-PRE: 2.64 L
POST FEV1/FVC RATIO: 80 %
PRE FEV6/FVC RATIO: 100 %
Post FEV6/FVC ratio: 100 %
Pre FEV1/FVC ratio: 78 %

## 2016-11-04 NOTE — Progress Notes (Signed)
PFT completed. Carena Stream,CMA  

## 2016-11-09 ENCOUNTER — Other Ambulatory Visit: Payer: Self-pay | Admitting: Family Medicine

## 2016-11-09 ENCOUNTER — Ambulatory Visit
Admission: RE | Admit: 2016-11-09 | Discharge: 2016-11-09 | Disposition: A | Discharge status: Home / Self Care | Payer: Medicare Other | Source: Ambulatory Visit | Attending: Family Medicine | Admitting: Family Medicine

## 2016-11-09 DIAGNOSIS — R059 Cough, unspecified: Secondary | ICD-10-CM

## 2016-11-09 DIAGNOSIS — R05 Cough: Secondary | ICD-10-CM

## 2016-11-12 ENCOUNTER — Telehealth: Payer: Self-pay | Admitting: Internal Medicine

## 2016-11-12 NOTE — Telephone Encounter (Signed)
New Message  Pt call requesting to speak with Rn about her breathing test results. Please call back to discuss

## 2016-11-12 NOTE — Telephone Encounter (Signed)
I spoke with patient about PFT results, she asked me to call and discuss results with her daughter, Nira Conn. I called and LMTCB for Heather.

## 2016-11-13 ENCOUNTER — Telehealth: Payer: Self-pay | Admitting: Internal Medicine

## 2016-11-13 NOTE — Telephone Encounter (Signed)
I spoke with patient to make her aware I had been unable to reach [facility]. I discussed PFT results with pt again, answered her questions, she said she understood and I did not need to call Heather.

## 2016-11-13 NOTE — Telephone Encounter (Signed)
I spoke with patient's daughter, Nira Conn, discussed PFT results and answered questions, she thanked me for the call.

## 2016-11-13 NOTE — Telephone Encounter (Signed)
I attempted to contact Heather by telephone again.

## 2016-11-13 NOTE — Telephone Encounter (Signed)
New message    Please call daughter Nira Conn at work  [phone]. Wanting to discuss results, states her mother did not understand and wants clarification .Please call

## 2016-11-16 ENCOUNTER — Ambulatory Visit (HOSPITAL_COMMUNITY)
Admission: RE | Admit: 2016-11-16 | Discharge: 2016-11-16 | Disposition: A | Discharge status: Home / Self Care | Payer: Medicare Other | Attending: Psychiatry | Admitting: Psychiatry

## 2016-11-16 DIAGNOSIS — N183 Chronic kidney disease, stage 3 (moderate): Secondary | ICD-10-CM | POA: Insufficient documentation

## 2016-11-16 DIAGNOSIS — Z79899 Other long term (current) drug therapy: Secondary | ICD-10-CM | POA: Diagnosis not present

## 2016-11-16 DIAGNOSIS — I129 Hypertensive chronic kidney disease with stage 1 through stage 4 chronic kidney disease, or unspecified chronic kidney disease: Secondary | ICD-10-CM | POA: Diagnosis not present

## 2016-11-16 DIAGNOSIS — G4730 Sleep apnea, unspecified: Secondary | ICD-10-CM | POA: Insufficient documentation

## 2016-11-16 DIAGNOSIS — I481 Persistent atrial fibrillation: Secondary | ICD-10-CM | POA: Diagnosis not present

## 2016-11-16 DIAGNOSIS — Z800 Family history of malignant neoplasm of digestive organs: Secondary | ICD-10-CM | POA: Insufficient documentation

## 2016-11-16 DIAGNOSIS — Z8249 Family history of ischemic heart disease and other diseases of the circulatory system: Secondary | ICD-10-CM | POA: Insufficient documentation

## 2016-11-16 DIAGNOSIS — Z90710 Acquired absence of both cervix and uterus: Secondary | ICD-10-CM | POA: Insufficient documentation

## 2016-11-16 DIAGNOSIS — M1990 Unspecified osteoarthritis, unspecified site: Secondary | ICD-10-CM | POA: Insufficient documentation

## 2016-11-16 DIAGNOSIS — F411 Generalized anxiety disorder: Secondary | ICD-10-CM | POA: Diagnosis present

## 2016-11-16 DIAGNOSIS — F331 Major depressive disorder, recurrent, moderate: Secondary | ICD-10-CM | POA: Insufficient documentation

## 2016-11-16 DIAGNOSIS — Z888 Allergy status to other drugs, medicaments and biological substances status: Secondary | ICD-10-CM | POA: Diagnosis not present

## 2016-11-16 DIAGNOSIS — Z96652 Presence of left artificial knee joint: Secondary | ICD-10-CM | POA: Diagnosis not present

## 2016-11-16 DIAGNOSIS — K219 Gastro-esophageal reflux disease without esophagitis: Secondary | ICD-10-CM | POA: Insufficient documentation

## 2016-11-16 DIAGNOSIS — Z9049 Acquired absence of other specified parts of digestive tract: Secondary | ICD-10-CM | POA: Diagnosis not present

## 2016-11-16 DIAGNOSIS — E785 Hyperlipidemia, unspecified: Secondary | ICD-10-CM | POA: Insufficient documentation

## 2016-11-16 DIAGNOSIS — R7303 Prediabetes: Secondary | ICD-10-CM | POA: Insufficient documentation

## 2016-11-16 NOTE — H&P (Signed)
Behavioral Health Medical Screening Exam  [name] [name] is an 71 y.o. female. Walk-in to [facility] accompanied with her husband. Reports she recently had her medication adjusted by her PCP. Reports her Wellbutrin was increased to 300 mg and Buspar was added. Bilan reports she is stilling feeling depressed. States she has never been followed by Psychiatry or seen a therapy or counselor. Patient currently denies suicidal or homicidal ideations. Support, reassurance and Encouragement was provided.   Total Time spent with patient: 15 minutes  Psychiatric Specialty Exam: Physical Exam  Vitals reviewed. Constitutional: She appears well-developed.  Cardiovascular: Normal rate.  Neurological: She is alert.  Psychiatric: She has a normal mood and affect. Her behavior is normal.    Review of Systems  Psychiatric/Behavioral: Positive for depression. Negative for hallucinations, substance abuse and suicidal ideas. The patient is nervous/anxious.     There were no vitals taken for this visit.There is no height or weight on file to calculate BMI.  General Appearance: Casual tearful during this assessment   Eye Contact:  Good  Speech:  Clear and Coherent  Volume:  Normal  Mood:  Depressed  Affect:  Congruent and Depressed  Thought Process:  Coherent  Orientation:  Full (Time, Place, and Person)  Thought Content:  Hallucinations: None  Suicidal Thoughts:  No  Homicidal Thoughts:  No  Memory:  Immediate;   Fair Recent;   Fair Remote;   Fair  Judgement:  Good  Insight:  Fair  Psychomotor Activity:  Normal  Concentration: Concentration: Fair  Recall:  Good  Fund of Knowledge:Good  Language: Good  Akathisia:  No  Handed:  Right  AIMS (if indicated):     Assets:  Communication Skills Desire for Improvement Social Support  Sleep:       Musculoskeletal: Strength & Muscle Tone: within normal limits Gait & Station: normal Patient leans: N/A  There were no vitals taken for this visit. B/P 101/60  HR 68 RM 100% RR 16  Recommendations: Based on my evaluation the patient does not appear to have an emergency medical condition.  TTS- made patient appt with [facility] outpatient Psychiatry 11/2016  Derrill Center, NP 11/16/2016, 11:27 AM

## 2016-11-16 NOTE — BH Assessment (Addendum)
Assessment Note  [name] [name] is a 71 y.o. female in [facility] as a walk in due to anxiety, tearfulness, insomnia and feelings of worthlessness. Pt is very tearful in assessment. Pt reports symptoms started about 2 weeks ago. No trigger identified but it is noted that pt was started on Buspar 10mg  at that time. Pt is prescribed her psychotropic meds by her PCP, Dr. Harlan Stains, who recommended she come in for an assessment. Pt denies SI, HI, AVH nor any hx of either. No other complaints.   Diagnosis: GAD; MDD, recurrent episode, moderate  Past Medical History:  Past Medical History:  Diagnosis Date  . Anxiety   . Arthritis   . Cataracts, bilateral    immature  . CKD (chronic kidney disease) stage 3, GFR 30-59 ml/min (HCC)   . Depression   . GERD (gastroesophageal reflux disease)    was on Nexium   . GERD (gastroesophageal reflux disease)   . Hemorrhoid   . History of bronchitis    couple of yrs ago  . History of colon polyps   . History of peristent atrial fibrillation   . Hyperlipidemia    takes Pravastatin daily  . Hypertension    takes Amlodipine and Diovan daily  . Hypertension   . Pre-diabetes   . Sleep apnea    no cpap used  . Sleep apnea    study done 20+yrs ago and doesn't use cpap  . Urinary urgency   . Weakness    in left leg r/t back    Past Surgical History:  Procedure Laterality Date  . ABDOMINAL HYSTERECTOMY  1980   partial  . ABDOMINAL HYSTERECTOMY     partial  . BACK SURGERY    . CARPAL TUNNEL RELEASE    . CHOLECYSTECTOMY  1975  . CHOLECYSTECTOMY    . COLONOSCOPY    . ESOPHAGOGASTRODUODENOSCOPY    . FOOT SURGERY     left, right  . FRACTURE SURGERY     left leg-knee  . HEEL SPUR EXCISION Bilateral   . leg surgery d/t break Left   . NISSEN FUNDOPLICATION    . TOTAL KNEE ARTHROPLASTY Left 10/07/2016    Family History:  Family History  Problem Relation Age of Onset  . Colon cancer Mother   . Heart disease Father     Social History:  reports  that  has never smoked. she has never used smokeless tobacco. She reports that she does not drink alcohol or use drugs.  Additional Social History:  Alcohol / Drug Use Pain Medications: see PTA meds Prescriptions: see PTA meds Over the Counter: see PTA meds History of alcohol / drug use?: No history of alcohol / drug abuse  CIWA:   COWS:    Allergies:  Allergies  Allergen Reactions  . Lexapro [Escitalopram]     Makes her feel funny  . Peroxide [Hydrogen Peroxide] Other (See Comments)    Redness.   [place] [Sertraline Hcl]     Makes her feel funny  . Neosporin [Neomycin-Bacitracin Zn-Polymyx] Rash    Blisters, itching.     Home Medications:  (Not in a hospital admission)  OB/GYN Status:  No LMP recorded. Patient has had a hysterectomy.  General Assessment Data Location of Assessment: [facility] Assessment Services TTS Assessment: In system Is this a Tele or Face-to-Face Assessment?: Face-to-Face Is this an Initial Assessment or a Re-assessment for this encounter?: Initial Assessment Marital status: Married Is patient pregnant?: No Pregnancy Status: No Living Arrangements: Spouse/significant other  Can pt return to current living arrangement?: Yes Admission Status: Voluntary Is patient capable of signing voluntary admission?: Yes Referral Source: Self/Family/Friend Insurance type: [facility] MCR  Medical Screening Exam ([place]) Medical Exam completed: Yes  Crisis Care Plan Living Arrangements: Spouse/significant other Name of Psychiatrist: none Name of Therapist: none  Education Status Is patient currently in school?: No  Risk to self with the past 6 months Suicidal Ideation: No Has patient been a risk to self within the past 6 months prior to admission? : No Suicidal Intent: No Has patient had any suicidal intent within the past 6 months prior to admission? : No Is patient at risk for suicide?: No Suicidal Plan?: No Has patient had any suicidal plan within the  past 6 months prior to admission? : No Access to Means: No Previous Attempts/Gestures: No Intentional Self Injurious Behavior: None Family Suicide History: Unknown Recent stressful life event(s): Other (Comment) Persecutory voices/beliefs?: No Depression: Yes Depression Symptoms: Tearfulness, Insomnia, Feeling worthless/self pity, Feeling angry/irritable Substance abuse history and/or treatment for substance abuse?: No Suicide prevention information given to non-admitted patients: Not applicable  Risk to Others within the past 6 months Homicidal Ideation: No Does patient have any lifetime risk of violence toward others beyond the six months prior to admission? : No Thoughts of Harm to Others: No Current Homicidal Intent: No Current Homicidal Plan: No Access to Homicidal Means: No History of harm to others?: No Assessment of Violence: None Noted Does patient have access to weapons?: No Criminal Charges Pending?: No Does patient have a court date: No Is patient on probation?: No  Psychosis Hallucinations: None noted Delusions: None noted  Mental Status Report Appearance/Hygiene: Unremarkable Eye Contact: Good Motor Activity: Unremarkable Speech: Logical/coherent Level of Consciousness: Crying, Alert Mood: Anxious, Sad Affect: Appropriate to circumstance Anxiety Level: Moderate Thought Processes: Coherent, Relevant Judgement: Partial Orientation: Person, Place, Time, Situation, Appropriate for developmental age Obsessive Compulsive Thoughts/Behaviors: None  Cognitive Functioning Concentration: Fair Memory: Recent Intact, Remote Intact IQ: Average Insight: Fair Impulse Control: Unable to Assess Appetite: Fair Sleep: Decreased Total Hours of Sleep: (few hours within a week) Vegetative Symptoms: None  ADLScreening [facility] Assessment Services) Patient's cognitive ability adequate to safely complete daily activities?: Yes Patient able to express need for assistance with  ADLs?: Yes Independently performs ADLs?: Yes (appropriate for developmental age)  Prior Inpatient Therapy Prior Inpatient Therapy: No  Prior Outpatient Therapy Prior Outpatient Therapy: Yes Prior Therapy Dates: several years ago Reason for Treatment: anxiety; depression Does patient have an ACCT team?: No Does patient have Intensive In-House Services?  : No Does patient have [facility] services? : No Does patient have P4CC services?: No  ADL Screening (condition at time of admission) Patient's cognitive ability adequate to safely complete daily activities?: Yes Is the patient deaf or have difficulty hearing?: No Does the patient have difficulty seeing, even when wearing glasses/contacts?: No Does the patient have difficulty concentrating, remembering, or making decisions?: No Patient able to express need for assistance with ADLs?: Yes Does the patient have difficulty dressing or bathing?: No Independently performs ADLs?: Yes (appropriate for developmental age) Does the patient have difficulty walking or climbing stairs?: No Weakness of Legs: None Weakness of Arms/Hands: None  Home Assistive Devices/Equipment Home Assistive Devices/Equipment: None    Abuse/Neglect Assessment (Assessment to be complete while patient is alone) Abuse/Neglect Assessment Can Be Completed: Yes Physical Abuse: Denies Verbal Abuse: Denies Sexual Abuse: Denies Exploitation of patient/patient's resources: Denies Self-Neglect: Denies Values / Beliefs Cultural Requests During Hospitalization:  None Spiritual Requests During Hospitalization: None   Advance Directives (For Healthcare) Does Patient Have a Medical Advance Directive?: No Would patient like information on creating a medical advance directive?: No - Patient declined Nutrition Screen- [facility] Adult/WL/AP Patient's home diet: Regular Has the patient recently lost weight without trying?: Yes, 2-13 lbs. Has the patient been eating poorly because of a  decreased appetite?: Yes Malnutrition Screening Tool Score: 2  Additional Information 1:1 In Past 12 Months?: No CIRT Risk: No Elopement Risk: No Does patient have medical clearance?: Yes     Disposition:  Disposition Initial Assessment Completed for this Encounter: Yes(consulted with Ricky Ala, NP) Disposition of Patient: Outpatient treatment Type of outpatient treatment: Adult(Appt with Dr. Adele Schilder 12/17/2016 @ 1215)  On Site Evaluation by:   Reviewed with Physician:    Rexene Edison 11/16/2016 11:16 AM

## 2016-11-18 ENCOUNTER — Other Ambulatory Visit (HOSPITAL_COMMUNITY): Payer: Self-pay | Admitting: Nurse Practitioner

## 2016-11-20 ENCOUNTER — Ambulatory Visit (HOSPITAL_COMMUNITY)
Admission: RE | Admit: 2016-11-20 | Discharge: 2016-11-20 | Disposition: A | Discharge status: Home / Self Care | Payer: Medicare Other | Attending: Psychiatry | Admitting: Psychiatry

## 2016-11-20 DIAGNOSIS — F329 Major depressive disorder, single episode, unspecified: Secondary | ICD-10-CM | POA: Insufficient documentation

## 2016-11-20 DIAGNOSIS — F419 Anxiety disorder, unspecified: Secondary | ICD-10-CM | POA: Diagnosis present

## 2016-11-20 NOTE — H&P (Signed)
Behavioral Health Medical Screening Exam  Elonna Mcfarlane Swendsen is an 71 y.o. female.  Total Time spent with patient: 45 minutes  Psychiatric Specialty Exam: Physical Exam  Vitals reviewed. Constitutional: She is oriented to person, place, and time.  Neck: Normal range of motion.  Respiratory: Effort normal.  Musculoskeletal: Normal range of motion.  Neurological: She is alert and oriented to person, place, and time.  Skin: Skin is warm and dry.  Psychiatric: Her speech is normal and behavior is normal. Judgment and thought content normal. Cognition and memory are normal. She exhibits a depressed mood. She expresses no suicidal ideation.    Review of Systems  Psychiatric/Behavioral: Positive for depression. Hallucinations: Denies. Memory loss: Denies. Substance abuse: Denies. Suicidal ideas: Denies. Nervous/anxious: Denies. Insomnia: Denies.   All other systems reviewed and are negative.   Blood pressure (!) 155/65, pulse 66, temperature 98 F (36.7 C), resp. rate 16, SpO2 100 %.There is no height or weight on file to calculate BMI.  General Appearance: Casual and Neat  Eye Contact:  Good  Speech:  Clear and Coherent and Normal Rate  Volume:  Normal  Mood:  Depressed  Affect:  Depressed and Tearful  Thought Process:  Coherent and Goal Directed  Orientation:  Full (Time, Place, and Person)  Thought Content:  Logical  Suicidal Thoughts:  No  Homicidal Thoughts:  No  Memory:  Immediate;   Good Recent;   Good Remote;   Good  Judgement:  Intact  Insight:  Present  Psychomotor Activity:  Normal  Concentration: Concentration: Good and Attention Span: Good  Recall:  [place]  Language: Good  Akathisia:  No  Handed:  Right  AIMS (if indicated):     Assets:  Communication Skills Desire for Improvement Financial Resources/Insurance Housing Physical Health Social Support Transportation  Sleep:       Musculoskeletal: Strength & Muscle Tone: within normal  limits Gait & Station: normal Patient leans: N/A  Blood pressure (!) 155/65, pulse 66, temperature 98 F (36.7 C), resp. rate 16, SpO2 100 %.   Assessment:  Fatima Sanger, 71 y.o., female presented to [facility] as walk-in brought by her daughter with complaints of worsening depression.  Patient states that she went to [facility] earlier today as a walk-in but only intake was done but would not do any medication adjustments until got records from her PCP.  Patient states that she has worsening depression but would never kill her self related to her living for her family and her religious beliefs; but wants to see a psychiatrist and therapy.  Discussed IOP and PH and patient interested in IOP.  Discussed that she would not hear anything until Monday because already closed today; understanding voiced.  Patient is able to contract for safety.  Daughter at her side and states that mother is safe to go home she will be checking and husband is also supportive...  Recommendations:  Referral to Intensive outpatient or Partial Hospitalization  No evidence of imminent risk to self or others at present.   Patient does not meet criteria for psychiatric inpatient admission. Supportive therapy provided about ongoing stressors. Refer to IOP. Discussed crisis plan, support from social network, calling 911, coming to the Emergency Department, and calling Suicide Hotline.  Based on my evaluation the patient does not appear to have an emergency medical condition.  Chang Tiggs, NP 11/20/2016, 4:26 PM

## 2016-11-20 NOTE — BH Assessment (Signed)
Assessment Note  [name] [name] is an 71 y.o. female presenting to [facility] today with her daughter and husband. The patient was seen as a walk-in earlier this week and recommended for outpatient psychiatry. The patient was provided an appointment with Dr. Adele Schilder on December 12/6.  The patient reports vegetative symptoms, depressed mood,  remains anxious and has decreased sleep. The patient appears to ruminate about acid reflux symptoms which produced decreased appetite. The patient was tearful in the assessment when talking about a prior period with similar symptoms of depression and anxiety in the 1978-05-08, when her mother died. Can not identify any current stressors. Denies SI, HI and A/V.   The patient's PCP started her on Lorazepam .5mg   x2 daily. Reports some improved in anxiety but felt drowsy. Today patient only took 1/2 of a .5mg . Due to concerns she would need treatment prior to 12/17/16 the patient daughter took her to [facility] today. She was seen by Dr. Hoyle Barr but the provider did not feel comfortable making medication changes without her records from her PCP. The patient's PCP is Dr. Dema Severin @ [phone]. The patient's daughter called her insurance company [facility] to see about other treatment options. She was informed the insurance company covers treatment for services such as IOP or PHP.  The patient verbally expressed interest in these programs at [facility].   Shuvon Rankin FNP and Dr. Dwyane Dee recommend IOP. The program is closed for the weekend. This clinician will email IOP to contact and set up start date. The patient was willing and able to attend. Given information about the program.  Diagnosis: MDD; Generalized Anxiety disorder   Past Medical History:  Past Medical History:  Diagnosis Date  . Anxiety   . Arthritis   . Cataracts, bilateral    immature  . CKD (chronic kidney disease) stage 3, GFR 30-59 ml/min (HCC)   . Depression   . GERD (gastroesophageal reflux disease)    was on Nexium   . GERD (gastroesophageal reflux disease)   . Hemorrhoid   . History of bronchitis    couple of yrs ago  . History of colon polyps   . History of peristent atrial fibrillation   . Hyperlipidemia    takes Pravastatin daily  . Hypertension    takes Amlodipine and Diovan daily  . Hypertension   . Pre-diabetes   . Sleep apnea    no cpap used  . Sleep apnea    study done 20+yrs ago and doesn't use cpap  . Urinary urgency   . Weakness    in left leg r/t back    Past Surgical History:  Procedure Laterality Date  . ABDOMINAL HYSTERECTOMY  1980   partial  . ABDOMINAL HYSTERECTOMY     partial  . BACK SURGERY    . CARPAL TUNNEL RELEASE    . CHOLECYSTECTOMY  1975  . CHOLECYSTECTOMY    . COLONOSCOPY    . ESOPHAGOGASTRODUODENOSCOPY    . FOOT SURGERY     left, right  . FRACTURE SURGERY     left leg-knee  . HEEL SPUR EXCISION Bilateral   . leg surgery d/t break Left   . NISSEN FUNDOPLICATION    . TOTAL KNEE ARTHROPLASTY Left 10/07/2016    Family History:  Family History  Problem Relation Age of Onset  . Colon cancer Mother   . Heart disease Father     Social History:  reports that  has never smoked. she has never used smokeless tobacco.  She reports that she does not drink alcohol or use drugs.  Additional Social History:  Alcohol / Drug Use Pain Medications: see MAR Prescriptions: see MAR Over the Counter: see MAR History of alcohol / drug use?: No history of alcohol / drug abuse  CIWA:   COWS:    Allergies:  Allergies  Allergen Reactions  . Lexapro [Escitalopram]     Makes her feel funny  . Peroxide [Hydrogen Peroxide] Other (See Comments)    Redness.   [place] [Sertraline Hcl]     Makes her feel funny  . Neosporin [Neomycin-Bacitracin Zn-Polymyx] Rash    Blisters, itching.     Home Medications:  (Not in a hospital admission)  OB/GYN Status:  No LMP recorded. Patient has had a hysterectomy.  General Assessment Data Location of  Assessment: [facility] Assessment Services TTS Assessment: In system Is this a Tele or Face-to-Face Assessment?: Face-to-Face Is this an Initial Assessment or a Re-assessment for this encounter?: Initial Assessment Marital status: Married Is patient pregnant?: No Pregnancy Status: No Living Arrangements: Spouse/significant other Can pt return to current living arrangement?: Yes Admission Status: Voluntary Is patient capable of signing voluntary admission?: Yes Referral Source: Self/Family/Friend Insurance type: [facility] MCR  Medical Screening Exam ([place]) Medical Exam completed: Yes  Crisis Care Plan Living Arrangements: Spouse/significant other Name of Psychiatrist: [place] Name of Therapist: n/a  Education Status Is patient currently in school?: No  Risk to self with the past 6 months Suicidal Ideation: No Has patient been a risk to self within the past 6 months prior to admission? : No Suicidal Intent: No Has patient had any suicidal intent within the past 6 months prior to admission? : No Is patient at risk for suicide?: No Suicidal Plan?: No Has patient had any suicidal plan within the past 6 months prior to admission? : No Access to Means: No What has been your use of drugs/alcohol within the last 12 months?: n/a Previous Attempts/Gestures: No How many times?: 0 Other Self Harm Risks: n/a Triggers for Past Attempts: None known Intentional Self Injurious Behavior: None Family Suicide History: Unknown Recent stressful life event(s): Other (Comment) Persecutory voices/beliefs?: No Depression: Yes Depression Symptoms: Insomnia, Tearfulness, Feeling worthless/self pity Substance abuse history and/or treatment for substance abuse?: No Suicide prevention information given to non-admitted patients: Not applicable  Risk to Others within the past 6 months Homicidal Ideation: No Does patient have any lifetime risk of violence toward others beyond the six months  prior to admission? : No Thoughts of Harm to Others: No Current Homicidal Intent: No Current Homicidal Plan: No Access to Homicidal Means: No History of harm to others?: No Assessment of Violence: None Noted Does patient have access to weapons?: No Criminal Charges Pending?: No Does patient have a court date: No Is patient on probation?: No  Psychosis Hallucinations: None noted Delusions: None noted  Mental Status Report Appearance/Hygiene: Unremarkable Eye Contact: Good Motor Activity: Unremarkable Speech: Logical/coherent Level of Consciousness: Alert, Crying Mood: Depressed, Anxious Affect: Depressed Anxiety Level: Severe Thought Processes: Coherent, Relevant Judgement: Partial Orientation: Person, Place, Time, Situation Obsessive Compulsive Thoughts/Behaviors: None  Cognitive Functioning Concentration: Fair Memory: Recent Intact, Remote Intact IQ: Average Insight: Fair Impulse Control: Good Appetite: Poor Weight Loss: 0 Weight Gain: 0 Sleep: Decreased Total Hours of Sleep: 5 Vegetative Symptoms: Staying in bed, Not bathing, Decreased grooming  ADLScreening [facility] Assessment Services) Patient's cognitive ability adequate to safely complete daily activities?: Yes Patient able to express need for assistance with ADLs?:  Yes Independently performs ADLs?: Yes (appropriate for developmental age)  Prior Inpatient Therapy Prior Inpatient Therapy: No  Prior Outpatient Therapy Prior Outpatient Therapy: Yes Prior Therapy Dates: several years ago Reason for Treatment: anxiety and depression Does patient have an ACCT team?: No Does patient have Intensive In-House Services?  : No Does patient have [facility] services? : No Does patient have P4CC services?: No  ADL Screening (condition at time of admission) Patient's cognitive ability adequate to safely complete daily activities?: Yes Is the patient deaf or have difficulty hearing?: No Does the patient have difficulty  seeing, even when wearing glasses/contacts?: No Does the patient have difficulty concentrating, remembering, or making decisions?: No Patient able to express need for assistance with ADLs?: Yes Does the patient have difficulty dressing or bathing?: No Independently performs ADLs?: Yes (appropriate for developmental age)       Abuse/Neglect Assessment (Assessment to be complete while patient is alone) Abuse/Neglect Assessment Can Be Completed: Yes Physical Abuse: Denies Verbal Abuse: Denies Sexual Abuse: Denies     Advance Directives (For Healthcare) Does Patient Have a Medical Advance Directive?: No Would patient like information on creating a medical advance directive?: No - Patient declined    Additional Information 1:1 In Past 12 Months?: No CIRT Risk: No Elopement Risk: No Does patient have medical clearance?: Yes     Disposition:  Disposition Initial Assessment Completed for this Encounter: Yes Disposition of Patient: Outpatient treatment Type of outpatient treatment: Psych Intensive Outpatient  On Site Evaluation by:   Reviewed with Physician:  Earleen Newport FNP and Dr. Demetrius Charity H Debborah Alonge 11/20/2016 3:46 PM

## 2016-11-23 ENCOUNTER — Telehealth: Payer: Self-pay | Admitting: Internal Medicine

## 2016-11-23 NOTE — Telephone Encounter (Signed)
Patient is curious if it is okay to take probiotics Please call to advise

## 2016-11-24 NOTE — Telephone Encounter (Signed)
No answer. Left message to call back.   

## 2016-11-25 ENCOUNTER — Other Ambulatory Visit (HOSPITAL_COMMUNITY): Payer: Medicare Other | Attending: Psychiatry | Admitting: Psychiatry

## 2016-11-25 ENCOUNTER — Encounter (HOSPITAL_COMMUNITY): Payer: Self-pay | Admitting: Psychiatry

## 2016-11-25 DIAGNOSIS — Z86010 Personal history of colonic polyps: Secondary | ICD-10-CM | POA: Insufficient documentation

## 2016-11-25 DIAGNOSIS — G4730 Sleep apnea, unspecified: Secondary | ICD-10-CM | POA: Insufficient documentation

## 2016-11-25 DIAGNOSIS — I4891 Unspecified atrial fibrillation: Secondary | ICD-10-CM | POA: Insufficient documentation

## 2016-11-25 DIAGNOSIS — E785 Hyperlipidemia, unspecified: Secondary | ICD-10-CM | POA: Insufficient documentation

## 2016-11-25 DIAGNOSIS — I129 Hypertensive chronic kidney disease with stage 1 through stage 4 chronic kidney disease, or unspecified chronic kidney disease: Secondary | ICD-10-CM | POA: Insufficient documentation

## 2016-11-25 DIAGNOSIS — Z79899 Other long term (current) drug therapy: Secondary | ICD-10-CM | POA: Insufficient documentation

## 2016-11-25 DIAGNOSIS — F411 Generalized anxiety disorder: Secondary | ICD-10-CM

## 2016-11-25 DIAGNOSIS — F332 Major depressive disorder, recurrent severe without psychotic features: Secondary | ICD-10-CM

## 2016-11-25 DIAGNOSIS — R7303 Prediabetes: Secondary | ICD-10-CM | POA: Insufficient documentation

## 2016-11-25 DIAGNOSIS — N183 Chronic kidney disease, stage 3 (moderate): Secondary | ICD-10-CM | POA: Insufficient documentation

## 2016-11-25 DIAGNOSIS — G4700 Insomnia, unspecified: Secondary | ICD-10-CM | POA: Insufficient documentation

## 2016-11-25 DIAGNOSIS — K219 Gastro-esophageal reflux disease without esophagitis: Secondary | ICD-10-CM | POA: Insufficient documentation

## 2016-11-25 HISTORY — DX: Major depressive disorder, recurrent severe without psychotic features: F33.2

## 2016-11-25 HISTORY — DX: Generalized anxiety disorder: F41.1

## 2016-11-25 NOTE — Progress Notes (Signed)
Psychiatric Initial Adult Assessment   Patient Identification: [name] [name] MRN:  [id number] Date of Evaluation:  11/25/2016 Referral Source: self Chief Complaint: high anxiety with depression to the point of hopelessness   Visit Diagnosis:    ICD-10-CM   1. Severe recurrent major depression without psychotic features ([place]) F33.2   2. Generalized anxiety disorder F41.1     History of Present Illness:  [name] [name] has been anxious all her life she said and depressed in the last few years as well as after her mother died in 42.  The current anxiety is extreme so that she wants to be admitted to see if she can find relief.  She worries all the time, cannot sleep well because of the worry, cannot enjoy anything or relax, worries about herself dying, her husband dying, wishes she could die, getting cancer from the chronic gastric reflux that is not responding to treatment and feeling guilty that she is feeling this way and causing others to have to worry about her.  Depression is also severe in that she has no energy, interest, motivation and the above symptoms as well.  She cries daily and feels irritable and sad as well.  There are no precipitants she can name.  She was diagnosed with atrial fibrillation earlier this year and did well but that might have hinted at her aging and eventual death but nothing major she says.  No history of inpatient.  She has been on buproprion for years but it has not helped in a while.  Her husband and children are also very supportive even though they do not always understand the anxiety and the help can be annoying.  Associated Signs/Symptoms: Depression Symptoms:  depressed mood, anhedonia, insomnia, fatigue, feelings of worthlessness/guilt, difficulty concentrating, hopelessness, impaired memory, recurrent thoughts of death, anxiety, loss of energy/fatigue, disturbed sleep, weight loss, decreased appetite, (Hypo) Manic Symptoms:  Irritable Mood, Anxiety  Symptoms:  Excessive Worry, Psychotic Symptoms:  none PTSD Symptoms: Negative  Past Psychiatric History: no inpatient, medications prescribed by her PCP  Previous Psychotropic Medications: Yes   Substance Abuse History in the last 12 months:  No.  Consequences of Substance Abuse: Negative  Past Medical History:  Past Medical History:  Diagnosis Date  . Anxiety   . Arthritis   . Cataracts, bilateral    immature  . CKD (chronic kidney disease) stage 3, GFR 30-59 ml/min (HCC)   . Depression   . GERD (gastroesophageal reflux disease)    was on Nexium   . GERD (gastroesophageal reflux disease)   . Hemorrhoid   . History of bronchitis    couple of yrs ago  . History of colon polyps   . History of peristent atrial fibrillation   . Hyperlipidemia    takes Pravastatin daily  . Hypertension    takes Amlodipine and Diovan daily  . Hypertension   . Pre-diabetes   . Sleep apnea    no cpap used  . Sleep apnea    study done 20+yrs ago and doesn't use cpap  . Urinary urgency   . Weakness    in left leg r/t back    Past Surgical History:  Procedure Laterality Date  . ABDOMINAL HYSTERECTOMY  1980   partial  . ABDOMINAL HYSTERECTOMY     partial  . BACK SURGERY    . CARPAL TUNNEL RELEASE    . CHOLECYSTECTOMY  1975  . CHOLECYSTECTOMY    . COLONOSCOPY    . ESOPHAGOGASTRODUODENOSCOPY    .  FOOT SURGERY     left, right  . FRACTURE SURGERY     left leg-knee  . HEEL SPUR EXCISION Bilateral   . leg surgery d/t break Left   . NISSEN FUNDOPLICATION    . TOTAL KNEE ARTHROPLASTY Left 10/07/2016    Family Psychiatric History: none  Family History:  Family History  Problem Relation Age of Onset  . Colon cancer Mother   . Heart disease Father     Social History:   Social History   Socioeconomic History  . Marital status: Married    Spouse name: None  . Number of children: None  . Years of education: None  . Highest education level: None  Social Needs  . Financial  resource strain: None  . Food insecurity - worry: None  . Food insecurity - inability: None  . Transportation needs - medical: None  . Transportation needs - non-medical: None  Occupational History  . None  Tobacco Use  . Smoking status: Never Smoker  . Smokeless tobacco: Never Used  Substance and Sexual Activity  . Alcohol use: No  . Drug use: No  . Sexual activity: Yes    Birth control/protection: Surgical  Other Topics Concern  . None  Social History Narrative   ** Merged History Encounter **        Additional Social History: none  Allergies:   Allergies  Allergen Reactions  . Lexapro [Escitalopram]     Makes her feel funny  . Peroxide [Hydrogen Peroxide] Other (See Comments)    Redness.   [name] [name] [Sertraline Hcl]     Makes her feel funny  . Neosporin [Neomycin-Bacitracin Zn-Polymyx] Rash    Blisters, itching.     Metabolic Disorder Labs: Lab Results  Component Value Date   HGBA1C 5.8 (H) 09/25/2016   MPG 119.76 09/25/2016   MPG 120 02/17/2016   No results found for: PROLACTIN Lab Results  Component Value Date   CHOL 125 02/18/2016   TRIG 90 02/18/2016   HDL 41 02/18/2016   CHOLHDL 3.0 02/18/2016   VLDL 18 02/18/2016   LDLCALC 66 02/18/2016     Current Medications: Current Outpatient Medications  Medication Sig Dispense Refill  . amiodarone (PACERONE) 200 MG tablet Take 1 tablet (200 mg total) by mouth daily. 90 tablet 2  . apixaban (ELIQUIS) 5 MG TABS tablet Take 1 tablet (5 mg total) by mouth 2 (two) times daily. 60 tablet 6  . Ascorbic Acid (VITAMIN C WITH ROSE HIPS) 1000 MG tablet Take 1,000 mg by mouth daily.    Marland Kitchen buPROPion (WELLBUTRIN XL) 300 MG 24 hr tablet Take 300 mg by mouth every morning.     . busPIRone (BUSPAR) 10 MG tablet     . CALCIUM PO Take 1 tablet by mouth every morning.    . Cholecalciferol (VITAMIN D) 2000 UNITS tablet Take 4,000 Units by mouth daily.    . [employer] MELATONIN PO Take 1 tablet by mouth at bedtime as needed.    .  fexofenadine (ALLEGRA) 180 MG tablet Take 180 mg by mouth daily as needed for allergies or rhinitis.    . furosemide (LASIX) 20 MG tablet TAKE 1 TABLET BY MOUTH ONCE DAILY 90 tablet 2  . metoprolol succinate (TOPROL-XL) 25 MG 24 hr tablet TAKE 1/2 (ONE-HALF) TABLET BY MOUTH ONCE DAILY 30 tablet 10  . ondansetron (ZOFRAN) 4 MG tablet Take 1 tablet (4 mg total) by mouth every 8 (eight) hours as needed for nausea or vomiting. 40 tablet  0  . oxyCODONE-acetaminophen (ROXICET) 5-325 MG tablet Take 1-2 tablets by mouth every 4 (four) hours as needed. 60 tablet 0  . potassium chloride SA (K-DUR,KLOR-CON) 20 MEQ tablet Take 1 tablet (20 mEq total) by mouth daily. 30 tablet 6  . pravastatin (PRAVACHOL) 40 MG tablet Take 40 mg by mouth at bedtime.     No current facility-administered medications for this visit.     Neurologic: Headache: Negative Seizure: Negative Paresthesias:Negative  Musculoskeletal: Strength & Muscle Tone: within normal limits Gait & Station: normal Patient leans: N/A  Psychiatric Specialty Exam: ROS  There were no vitals taken for this visit.There is no height or weight on file to calculate BMI.  General Appearance: Well Groomed  Eye Contact:  Good  Speech:  Clear and Coherent  Volume:  Normal  Mood:  Anxious and Depressed  Affect:  Congruent  Thought Process:  Coherent and Goal Directed  Orientation:  Full (Time, Place, and Person)  Thought Content:  Logical  Suicidal Thoughts:  No  Homicidal Thoughts:  No  Memory:  Immediate;   Good Recent;   Good Remote;   Good  Judgement:  Good  Insight:  Fair  Psychomotor Activity:  Normal  Concentration:  Concentration: Good and Attention Span: Good  Recall:  Good  Fund of Knowledge:Good  Language: Good  Akathisia:  Negative  Handed:  Right  AIMS (if indicated):  0  Assets:  Communication Skills Desire for Improvement Financial Resources/Insurance Housing Intimacy Leisure Time Social  Support Talents/Skills Transportation Vocational/Educational  ADL's:  Intact  Cognition: WNL  Sleep:  poor    Treatment Plan Summary: Admit to IOP with daily group.  Will taper and discontinue the Wellbutrin, start mirtazepine, and keep the buspirone for now.  Consider hydroxyzine 10 mg prn for anxiety.   Donnelly Angelica, MD 11/14/201811:29 AM

## 2016-11-25 NOTE — Progress Notes (Signed)
Comprehensive Clinical Assessment (CCA) Note  11/25/2016 YSIDRA SOPHER [id number]  Visit Diagnosis:      ICD-10-CM   1. Severe recurrent major depression without psychotic features ([place]) F33.2   2. Generalized anxiety disorder F41.1       CCA Part One  Part One has been completed on paper by the patient.  (See scanned document in Chart Review)  CCA Part Two A  Intake/Chief Complaint:  CCA Intake With Chief Complaint CCA Part Two Date: 11/25/16 CCA Part Two Time: 1401 Chief Complaint/Presenting Problem: This is a 71 yr old, retired, married, Caucasian female; who was referred per Dr. Harlan Stains; treatment for worsening anxiety and depressive symptoms with vague SI.  Pt denies any plan or intent; but just passively states she wishes she could just go to sleep.  Discussed safety options at length with pt.  Pt able to contract for safety.  Her husband accompanied her while meeting with this Probation officer and Dr. Lovena Le.  According to pt, she has struggled with depression for a long time; since her mother died at a young age d/t cancer years ago.  Pt reports that the anxiety and depression started to worsen four weeks ago when her Acid Reflux continued to worsen.  According to pt, she had a total knee replacement Sept. 9371, had no complications; but then later started having acid reflux.  "I even had surgery to assist with it; but it didn't help.  I have changed my appetite and have taken various medications.  Nothing is helping, but it is getting worse.  My throat is burning all the time."  Pt has always been a worrier so this is feeding into her ruminating thoughts.  "I worry that I may have cancer or it may turn into cancer."  Pt denies any inpatient psychiatric hospitalizations or prior suicide attempts.  Admits to seeing a psychiatrist in 2009.  Denies any family hx of mental illness.  Pt resides with her supportive husband.                                                                                                        Patients Currently Reported Symptoms/Problems: Sadness, low self-esteem, indecisiveness, irritable, anhedonia, loss of motivation, poor appetite, poor sleep, ruminating thoughts, SI, no energy, tearful Collateral Involvement: Husband and adult kids are very supportive. Individual's Strengths: Pt is motivated for treatment. Individual's Abilities: Easy to redirect if needed. Type of Services Patient Feels Are Needed: MH-IOP  Mental Health Symptoms Depression:  Depression: Change in energy/activity, Difficulty Concentrating, Fatigue, Increase/decrease in appetite, Irritability, Sleep (too much or little), Tearfulness, Weight gain/loss  Mania:  Mania: N/A  Anxiety:   Anxiety: Restlessness, Worrying  Psychosis:  Psychosis: N/A  Trauma:  Trauma: N/A  Obsessions:  Obsessions: N/A  Compulsions:  Compulsions: N/A  Inattention:  Inattention: N/A  Hyperactivity/Impulsivity:  Hyperactivity/Impulsivity: N/A  Oppositional/Defiant Behaviors:  Oppositional/Defiant Behaviors: N/A  Borderline Personality:  Emotional Irregularity: N/A  Other Mood/Personality Symptoms:      Mental Status Exam Appearance and self-care  Stature:  Stature: Average  Weight:  Weight: Average weight  Clothing:  Clothing: Casual  Grooming:  Grooming: Normal  Cosmetic use:  Cosmetic Use: None  Posture/gait:  Posture/Gait: Normal  Motor activity:  Motor Activity: Not Remarkable  Sensorium  Attention:  Attention: Normal  Concentration:  Concentration: Preoccupied  Orientation:  Orientation: X5  Recall/memory:  Recall/Memory: Normal  Affect and Mood  Affect:  Affect: Depressed  Mood:  Mood: Anxious  Relating  Eye contact:  Eye Contact: Normal  Facial expression:  Facial Expression: Sad  Attitude toward examiner:  Attitude Toward Examiner: Cooperative  Thought and Language  Speech flow: Speech Flow: Normal  Thought content:  Thought Content: Appropriate to mood and circumstances   Preoccupation:  Preoccupations: Ruminations  Hallucinations:     Organization:     Transport planner of Knowledge:  Fund of Knowledge: Average  Intelligence:  Intelligence: Average  Abstraction:  Abstraction: Normal  Judgement:  Judgement: Normal  Reality Testing:  Reality Testing: Distorted  Insight:  Insight: Good  Decision Making:  Decision Making: Normal  Social Functioning  Social Maturity:  Social Maturity: Isolates  Social Judgement:  Social Judgement: Normal  Stress  Stressors:  Stressors: Grief/losses, Illness  Coping Ability:  Coping Ability: English as a second language teacher Deficits:     Supports:      Family and Psychosocial History: Family history Marital status: Married Number of Years Married: 60 What types of issues is patient dealing with in the relationship?: none Does patient have children?: Yes How many children?: 2 How is patient's relationship with their children?: Daughter is a Marine scientist at [employer] and son.  Raised great niece and nephew (nephew died couple yrs ago in MVA d/t ETOH)  Childhood History:  Childhood History By whom was/is the patient raised?: Both parents Additional childhood history information: Born and raised in [place], [place] on a farm.  States she and sister had a speech impediment.  "We were picked on.  My sister didn't have a problem with them picking on her but I did."  Pt admits to poor self-esteem.  Pt was very close to her mother.  Pt didn't like school at all.  Had a few friends.  Denies any trauma/abuse. Description of patient's relationship with caregiver when they were a child: Was very close to mom. Does patient have siblings?: Yes Number of Siblings: 1 Description of patient's current relationship with siblings: The one sister died in [place] January 28, 1998. Did patient suffer any verbal/emotional/physical/sexual abuse as a child?: No Did patient suffer from severe childhood neglect?: No Has patient ever been sexually abused/assaulted/raped as an  adolescent or adult?: No Was the patient ever a victim of a crime or a disaster?: No Witnessed domestic violence?: No Has patient been effected by domestic violence as an adult?: No  CCA Part Two B  Employment/Work Situation: Employment / Work Copywriter, advertising Employment situation: Retired Where was the patient employed at that time?: [employer] for over 30 plus yrs Has patient ever been in the [employer]?: No Has patient ever served in Recruitment consultant?: No Did You Receive Any Psychiatric Treatment/Services While in Passenger transport manager?: No Are There Guns or Other Weapons in [place]?: No  Education: Education Last Grade Completed: 12 Did Teacher, adult education From [employer]?: Yes Did Physicist, medical?: Yes What Type of College Degree Do you Have?: Some college Did [place]?: No What Was Your Major?: Accounting Did You Have An Individualized Education Program (IIEP): No Did You Have Any Difficulty At School?: No  Religion: Religion/Spirituality Are  You A Religious Person?: Yes What is Your Religious Affiliation?: Personal assistant: Leisure / Recreation Leisure and Hobbies: yardwork  Exercise/Diet: Exercise/Diet Do You Exercise?: No Have You Gained or Lost A Significant Amount of Weight in the Past Six Months?: Yes-Lost Do You Follow a Special Diet?: No Do You Have Any Trouble Sleeping?: Yes Explanation of Sleeping Difficulties: ruminating  CCA Part Two C  Alcohol/Drug Use: Alcohol / Drug Use Pain Medications: see MAR Prescriptions: see MAR Over the Counter: see MAR History of alcohol / drug use?: No history of alcohol / drug abuse                      CCA Part Three  ASAM's:  Six Dimensions of Multidimensional Assessment  Dimension 1:  Acute Intoxication and/or Withdrawal Potential:     Dimension 2:  Biomedical Conditions and Complications:     Dimension 3:  Emotional, Behavioral, or Cognitive Conditions and Complications:     Dimension 4:   Readiness to Change:     Dimension 5:  Relapse, Continued use, or Continued Problem Potential:     Dimension 6:  Recovery/Living Environment:      Substance use Disorder (SUD)    Social Function:  Social Functioning Social Maturity: Isolates Social Judgement: Normal  Stress:  Stress Stressors: Grief/losses, Illness Coping Ability: Overwhelmed Patient Takes Medications The Way The Doctor Instructed?: Yes Priority Risk: Moderate Risk  Risk Assessment- Self-Harm Potential: Risk Assessment For Self-Harm Potential Thoughts of Self-Harm: Vague current thoughts Method: No plan Availability of Means: No access/NA Additional Comments for Self-Harm Potential: Pt is able to contract for safety  Risk Assessment -Dangerous to Others Potential: Risk Assessment For Dangerous to Others Potential Method: No Plan Availability of Means: No access or NA Intent: Vague intent or NA Notification Required: No need or identified person  DSM5 Diagnoses: Patient Active Problem List   Diagnosis Date Noted  . Severe recurrent major depression without psychotic features ([place]) 11/25/2016    Class: Chronic  . Generalized anxiety disorder 11/25/2016    Class: Chronic  . Persistent atrial fibrillation ([place]) 10/31/2016  . Pruritus 10/31/2016  . Primary localized osteoarthritis of left knee 09/21/2016  . (HFpEF) heart failure with preserved ejection fraction ([place]) 07/06/2016  . Chest pain 07/06/2016  . Fatigue 07/06/2016  . Cough, persistent 05/19/2016  . Dyspnea on exertion 04/03/2016  . Mitral regurgitation 02/27/2016  . Hyperlipidemia   . Essential hypertension   . Chronic cough   . Gastroesophageal reflux disease without esophagitis   . Bilateral carpal tunnel syndrome 11/06/2015  . Cervical spondylosis without myelopathy 11/06/2015  . Primary osteoarthritis of both first carpometacarpal joints 11/06/2015  . Hx of colonic polyps 05/24/2014  . S/P lumbar laminectomy 04/05/2013  . Benign  neoplasm of colon 12/29/2011    Patient Centered Plan: Patient is on the following Treatment Plan(s):  Anxiety and Depression  Recommendations for Services/Supports/Treatments: Recommendations for Services/Supports/Treatments Recommendations For Services/Supports/Treatments: IOP (Intensive Outpatient Program)  Treatment Plan Summary:  Oriented pt to MH-IOP.  Provided pt with an orientation folder.  Encouraged support groups.  Will refer pt to Dr. Adele Schilder (appt already scheduled) and will refer pt to [facility], LCSW (for anxiety).  Referrals to Alternative Service(s): Referred to Alternative Service(s):   Place:   Date:   Time:    Referred to Alternative Service(s):   Place:   Date:   Time:    Referred to Alternative Service(s):   Place:   Date:   Time:  Referred to Alternative Service(s):   Place:   Date:   Time:     CLARK, RITA, M.Ed, CNA

## 2016-11-25 NOTE — Telephone Encounter (Signed)
Spoke with patients husband per release form and let him know that it would be fine for her to take the probiotics. He states they are currently in a doctor appointment and she is not able to talk at this time. He verbalized understanding of our conversation and let him know to have her call back if she should have any further questions.

## 2016-11-26 ENCOUNTER — Other Ambulatory Visit (HOSPITAL_COMMUNITY): Payer: Medicare Other | Admitting: Psychiatry

## 2016-11-26 DIAGNOSIS — F332 Major depressive disorder, recurrent severe without psychotic features: Secondary | ICD-10-CM

## 2016-11-26 MED ORDER — HYDROXYZINE HCL 10 MG PO TABS: ORAL_TABLET | ORAL | 1 refills | Status: DC

## 2016-11-26 MED ORDER — MIRTAZAPINE 7.5 MG PO TABS: 7.5000 mg | ORAL_TABLET | Freq: Every day | ORAL | 1 refills | Status: DC

## 2016-11-26 NOTE — Progress Notes (Signed)
Patient ID: [name] [name], female   DOB: 1945-09-10, 71 y.o.   MRN: [id number] Plan is to taper buproprion to 300 mg every other day for a week and then every third day for another week to 10 days.  Begin mirtazepine 7.5 mg hs tonight, begin hydroxyzine 10 mg bid prn for anxiety.  Continue buspar for now.

## 2016-11-27 ENCOUNTER — Other Ambulatory Visit (HOSPITAL_COMMUNITY): Payer: Medicare Other | Admitting: Psychiatry

## 2016-11-27 DIAGNOSIS — G4730 Sleep apnea, unspecified: Secondary | ICD-10-CM | POA: Diagnosis not present

## 2016-11-27 DIAGNOSIS — R7303 Prediabetes: Secondary | ICD-10-CM | POA: Diagnosis not present

## 2016-11-27 DIAGNOSIS — Z86010 Personal history of colonic polyps: Secondary | ICD-10-CM | POA: Diagnosis not present

## 2016-11-27 DIAGNOSIS — K219 Gastro-esophageal reflux disease without esophagitis: Secondary | ICD-10-CM | POA: Diagnosis not present

## 2016-11-27 DIAGNOSIS — Z79899 Other long term (current) drug therapy: Secondary | ICD-10-CM | POA: Diagnosis not present

## 2016-11-27 DIAGNOSIS — F332 Major depressive disorder, recurrent severe without psychotic features: Secondary | ICD-10-CM

## 2016-11-27 DIAGNOSIS — G4700 Insomnia, unspecified: Secondary | ICD-10-CM | POA: Diagnosis not present

## 2016-11-27 DIAGNOSIS — I4891 Unspecified atrial fibrillation: Secondary | ICD-10-CM | POA: Diagnosis not present

## 2016-11-27 DIAGNOSIS — E785 Hyperlipidemia, unspecified: Secondary | ICD-10-CM | POA: Diagnosis not present

## 2016-11-27 DIAGNOSIS — N183 Chronic kidney disease, stage 3 (moderate): Secondary | ICD-10-CM | POA: Diagnosis not present

## 2016-11-27 DIAGNOSIS — F411 Generalized anxiety disorder: Secondary | ICD-10-CM | POA: Diagnosis present

## 2016-11-27 DIAGNOSIS — I129 Hypertensive chronic kidney disease with stage 1 through stage 4 chronic kidney disease, or unspecified chronic kidney disease: Secondary | ICD-10-CM | POA: Diagnosis not present

## 2016-11-30 ENCOUNTER — Other Ambulatory Visit (HOSPITAL_COMMUNITY): Payer: Medicare Other

## 2016-12-01 ENCOUNTER — Other Ambulatory Visit (HOSPITAL_COMMUNITY): Payer: Medicare Other | Admitting: Psychiatry

## 2016-12-01 DIAGNOSIS — F332 Major depressive disorder, recurrent severe without psychotic features: Secondary | ICD-10-CM | POA: Diagnosis not present

## 2016-12-01 NOTE — Progress Notes (Signed)
    Daily Group Progress Note  Program: IOP  Group Time: 9:00-12:00   Participation Level: Active   Behavioral Response: Appropriate   Type of Therapy:  Group Therapy   Summary of Progress: Pt presented as engaged.  Pt expressed that she has low sense of self-worth.  Pt said she has always thought that she is not pretty, not good enough, etc.  Pt is self-critical and feels anxiety and sadness frequently.  Pt said that she has no friends and doesn't want to burden her children with her issues.  Pt said that she Pt watched Self Compassion Clare [employer]andy Talk and participated in follow-up discussion.  Nancie Neas, LPC

## 2016-12-02 ENCOUNTER — Other Ambulatory Visit (HOSPITAL_COMMUNITY): Payer: Medicare Other

## 2016-12-02 NOTE — Progress Notes (Signed)
    Daily Group Progress Note  Program: IOP  Group Time: 9:00-12:00   Participation Level: Active   Behavioral Response: Appropriate   Type of Therapy:  Group Therapy   Summary of Progress: Pt presented as engaged.  Pt said she has a difficult time asking for what she needs, particularly with her children.  Pt said that her children have been inconsiderate lately, which upsets her.  Counselors encouraged pt to first consider her needs and then think about how she may ask her children for some of those needs.  Pt said she would like for her children to visit her or call her more often.  Nancie Neas, LPC

## 2016-12-04 ENCOUNTER — Other Ambulatory Visit (HOSPITAL_COMMUNITY): Payer: Medicare Other

## 2016-12-05 ENCOUNTER — Other Ambulatory Visit (HOSPITAL_COMMUNITY): Payer: Self-pay | Admitting: Internal Medicine

## 2016-12-07 ENCOUNTER — Other Ambulatory Visit (HOSPITAL_COMMUNITY): Payer: Medicare Other | Admitting: Psychiatry

## 2016-12-07 DIAGNOSIS — F332 Major depressive disorder, recurrent severe without psychotic features: Secondary | ICD-10-CM | POA: Diagnosis not present

## 2016-12-08 ENCOUNTER — Other Ambulatory Visit (HOSPITAL_COMMUNITY): Payer: Medicare Other | Admitting: Psychiatry

## 2016-12-08 DIAGNOSIS — F332 Major depressive disorder, recurrent severe without psychotic features: Secondary | ICD-10-CM | POA: Diagnosis not present

## 2016-12-09 ENCOUNTER — Other Ambulatory Visit (HOSPITAL_COMMUNITY): Payer: Medicare Other | Admitting: Psychiatry

## 2016-12-09 DIAGNOSIS — F332 Major depressive disorder, recurrent severe without psychotic features: Secondary | ICD-10-CM | POA: Diagnosis not present

## 2016-12-09 MED ORDER — BUPROPION HCL ER (XL) 300 MG PO TB24: ORAL_TABLET | ORAL | 0 refills | Status: DC

## 2016-12-09 NOTE — Progress Notes (Signed)
Patient ID: [name] [name], female   DOB: 26-Oct-1945, 71 y.o.   MRN: [id number]       We were trying to discontinue the Wellbutrin by tapering it but [name] [name] says she feels worse as the Wellbutrin decreases so we will continue it at 150 mg instead of 300 mg

## 2016-12-10 ENCOUNTER — Other Ambulatory Visit (HOSPITAL_COMMUNITY): Payer: Medicare Other | Admitting: Psychiatry

## 2016-12-10 DIAGNOSIS — F332 Major depressive disorder, recurrent severe without psychotic features: Secondary | ICD-10-CM | POA: Diagnosis not present

## 2016-12-10 NOTE — Progress Notes (Signed)
    Daily Group Progress Note  Program: IOP  *Group Time: 9:00-12:00   Participation Level: Active   Behavioral Response: Appropriate   Type of Therapy:  Group Therapy   Summary of Progress: Pt presented as engaged.  Counselors aided pt in creating intentional model of cognitive modeling exercise.  Pt said her new thought is that people are mean and that she is still a good person.  The new feeling and result of this are hope and empowerment.  Pt listened to and engaged in presentation about exercise, nutrition and sleep from [employer].  Nancie Neas, LPC
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
## 2016-12-10 NOTE — Progress Notes (Signed)
    Daily Group Progress Note  Program: IOP  Group Time: 9:00-12:00  Participation Level: Active  Behavioral Response: Appropriate  Type of Therapy:  Group Therapy  Summary of Progress: Pt. Presented as calm, engaged in the group process. Pt. Participated in grief and loss group with the Chaplain. Pt. Provided thoughtful feedback to other group members about how she has coped with her anxiety and stuttering in her life. Pt. Participated in meditation mindfulness. and breathing activity. Pt. Shared that it was a challenge for her to focus on her thoughts. Counselor normalized this response and encouraged Pt. To continue a daily meditation practice.      Nancie Neas, LPC

## 2016-12-10 NOTE — Progress Notes (Signed)
    Daily Group Progress Note  Program: IOP  Group Time: 9:00-12:00   Participation Level: Active   Behavioral Response: Appropriate   Type of Therapy:  Group Therapy   Summary of Progress: Pt presented as engaged.  Pt listened and participated in pharmacist's presentation about medications.  Pt said that she had a good Thanksgiving and got along well with everyone in her family.  Pt participated in [employer], identifying family, spirituality and love as her top three values.   Nancie Neas, LPC

## 2016-12-11 ENCOUNTER — Other Ambulatory Visit (HOSPITAL_COMMUNITY): Payer: Medicare Other | Admitting: Psychiatry

## 2016-12-11 DIAGNOSIS — F332 Major depressive disorder, recurrent severe without psychotic features: Secondary | ICD-10-CM

## 2016-12-14 ENCOUNTER — Encounter: Payer: Self-pay | Admitting: Internal Medicine

## 2016-12-14 ENCOUNTER — Other Ambulatory Visit (HOSPITAL_COMMUNITY): Payer: Medicare Other | Admitting: Psychiatry

## 2016-12-15 ENCOUNTER — Other Ambulatory Visit (HOSPITAL_COMMUNITY): Payer: Medicare Other | Attending: Psychiatry | Admitting: Psychiatry

## 2016-12-15 DIAGNOSIS — F332 Major depressive disorder, recurrent severe without psychotic features: Secondary | ICD-10-CM | POA: Insufficient documentation

## 2016-12-15 DIAGNOSIS — F411 Generalized anxiety disorder: Secondary | ICD-10-CM | POA: Diagnosis present

## 2016-12-15 NOTE — Progress Notes (Signed)
[name] [name] is a 71 y.o. , retired, married, Caucasian female; who was referred per Dr. Harlan Stains; treatment for worsening anxiety and depressive symptoms with vague SI.  Pt denied any plan or intent; but just passively stated she wished she could just go to sleep.  Discussed safety options at length with pt.  Pt able to contract for safety.  Her husband accompanied her while meeting with this Probation officer and Dr. Lovena Le.  According to pt, she has struggled with depression for a long time; since her mother died at a young age d/t cancer years ago.  Pt reported that the anxiety and depression started to worsen four weeks ago when her Acid Reflux continued to worsen.  According to pt, she had a total knee replacement Sept. 2376, had no complications; but then later started having acid reflux.  "I even had surgery to assist with it; but it didn't help.  I have changed my appetite and have taken various medications.  Nothing is helping, but it is getting worse.  My throat is burning all the time."  Pt has always been a worrier so this is feeding into her ruminating thoughts.  "I worry that I may have cancer or it may turn into cancer."  Pt denied any inpatient psychiatric hospitalizations or prior suicide attempts.  Admitted to seeing a psychiatrist in 2009.  Denied any family hx of mental illness.  Pt resides with her supportive husband. Pt completed MH-IOP today.  Reports overall mood improved.  Admits to decreased anxiety and depressive symptoms.  Denies any current SI.  Denies HI or A/V hallucinations.  States she is working on being kind to herself and showing herself compassion.  Pt has no complaints of Acid Reflux issues. A:  D/C today.  F/U with Dr. Adele Schilder on 02-01-17.  Pt declining a referral to a therapist at this time.  States she may be changing her insurance company next year.  Recommended support groups.  R:  Pt receptive.                                                                                                                 Carlis Abbott, RITA, M.Ed, CNA

## 2016-12-15 NOTE — Patient Instructions (Signed)
D:  Pt successfully completed MH-IOP today.  A:  Follow up with Dr. Adele Schilder on 02-01-17 @ 10 a.m.  Pt is requesting to hold off on a referral to a therapist until the first of the yr due to a new insurance company.  Encouraged support groups.  R:  Pt receptive.
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
## 2016-12-15 NOTE — Progress Notes (Signed)
[facility] IOP DISCHARGE NOTE  Patient:  [name] [name] DOB:  1945-10-24  Date of Admission: 11/25/2016  Date of Discharge: 12/15/2016  Reason for Admission:anxiety and depression   IOP Course: attended and participated.  Anxiety greatly decreased. No mention of gastric reflux at discharge.  Feeling more optimistic about herself and her future.  Mental Status at Discharge:no suicidal thinking  Diagnosis: severe major depression recurrent without psychosis.  Generalized anxiety disorder Level of Care:  IOP  Discharge destination:  Has follow up appointment with a psychiatrist but wants to hold off on a therapist until new insurance begins in January     Comments:  Still has low self esteem and not sure how to improve it she says.  Doing very well with mirtazepine 7.5 mg hs, sleeping better and anxiety decreased but appetite has greatly increased  The patient received suicide prevention pamphlet:  Yes   Donnelly Angelica, MD Patient ID: Fatima Sanger, female   DOB: Jun 08, 1945, 71 y.o.   MRN: [id number]

## 2016-12-16 ENCOUNTER — Other Ambulatory Visit (HOSPITAL_COMMUNITY): Payer: Medicare Other

## 2016-12-17 ENCOUNTER — Ambulatory Visit (HOSPITAL_COMMUNITY): Payer: Self-pay | Admitting: Psychiatry

## 2016-12-17 ENCOUNTER — Other Ambulatory Visit (HOSPITAL_COMMUNITY): Payer: Medicare Other

## 2016-12-18 ENCOUNTER — Other Ambulatory Visit (HOSPITAL_COMMUNITY): Payer: Medicare Other

## 2016-12-18 NOTE — Progress Notes (Signed)
    Daily Group Progress Note  Program: IOP  Group Time: 9:00-12:00   Participation Level: Active   Behavioral Response: Appropriate   Type of Therapy:  Group Therapy   Summary of Progress: Pt presented as engaged.  Pt listened to [employer] Talk and participated in discussion about body image and self-compassion.  Pt said she has never thought of herself as pretty, which has shaped her self-confidence and self-compassion.  Pt said she is afraid to confront her children about what she needs, because she is afraid they will get angry and distance themselves from her even more.  Pt listened to and engaged in discussion with Chaplain about grief and loss.  Counselors and other group members shared affirmation and gratitude for pt's time in group.  Nancie Neas, LPC

## 2016-12-21 ENCOUNTER — Other Ambulatory Visit (HOSPITAL_COMMUNITY): Payer: Medicare Other

## 2016-12-22 ENCOUNTER — Other Ambulatory Visit (HOSPITAL_COMMUNITY): Payer: Medicare Other

## 2016-12-23 ENCOUNTER — Other Ambulatory Visit (HOSPITAL_COMMUNITY): Payer: Medicare Other

## 2016-12-24 ENCOUNTER — Other Ambulatory Visit (HOSPITAL_COMMUNITY): Payer: Medicare Other

## 2016-12-25 ENCOUNTER — Other Ambulatory Visit (HOSPITAL_COMMUNITY): Payer: Medicare Other

## 2016-12-28 ENCOUNTER — Other Ambulatory Visit (HOSPITAL_COMMUNITY): Payer: Medicare Other

## 2016-12-29 ENCOUNTER — Other Ambulatory Visit (HOSPITAL_COMMUNITY): Payer: Medicare Other

## 2016-12-30 ENCOUNTER — Other Ambulatory Visit (HOSPITAL_COMMUNITY): Payer: Medicare Other

## 2016-12-31 ENCOUNTER — Other Ambulatory Visit (HOSPITAL_COMMUNITY): Payer: Medicare Other

## 2017-01-01 ENCOUNTER — Other Ambulatory Visit (HOSPITAL_COMMUNITY): Payer: Medicare Other

## 2017-01-04 ENCOUNTER — Other Ambulatory Visit (HOSPITAL_COMMUNITY): Payer: Medicare Other

## 2017-01-06 ENCOUNTER — Other Ambulatory Visit (HOSPITAL_COMMUNITY): Payer: Medicare Other

## 2017-01-07 ENCOUNTER — Other Ambulatory Visit (HOSPITAL_COMMUNITY): Payer: Medicare Other

## 2017-01-08 ENCOUNTER — Other Ambulatory Visit (HOSPITAL_COMMUNITY): Payer: Self-pay | Admitting: Nurse Practitioner

## 2017-01-08 ENCOUNTER — Telehealth: Payer: Self-pay | Admitting: Internal Medicine

## 2017-01-08 ENCOUNTER — Other Ambulatory Visit (HOSPITAL_COMMUNITY): Payer: Medicare Other

## 2017-01-08 NOTE — Telephone Encounter (Signed)
Patient takes a potassium tablet that is getting too expensive  Patient would like to know if there is another medication to take its place Please call to discuss

## 2017-01-08 NOTE — Telephone Encounter (Signed)
Pt reports $45 for a 30-day supply of potassium 32mEq. She asks if she may replace this medication. Explained reason for potassium and that there is no alternative.  Her insurance company told her 36mEq tablets were much cheaper.  Advised pt to call pharmacy for her cost of potassium 74mEq (take two tablets qd) #60.  Potassium tablets also available OTC. Pt will call pharmacy then notify us if new prescription is needed.

## 2017-01-11 ENCOUNTER — Other Ambulatory Visit (HOSPITAL_COMMUNITY): Payer: Medicare Other

## 2017-01-13 ENCOUNTER — Other Ambulatory Visit (HOSPITAL_COMMUNITY): Payer: Medicare Other

## 2017-01-14 ENCOUNTER — Other Ambulatory Visit (HOSPITAL_COMMUNITY): Payer: Medicare Other

## 2017-01-15 ENCOUNTER — Other Ambulatory Visit (HOSPITAL_COMMUNITY): Payer: Medicare Other

## 2017-01-18 ENCOUNTER — Other Ambulatory Visit (HOSPITAL_COMMUNITY): Payer: Medicare Other

## 2017-01-19 ENCOUNTER — Other Ambulatory Visit (HOSPITAL_COMMUNITY): Payer: Medicare Other

## 2017-01-22 DIAGNOSIS — J309 Allergic rhinitis, unspecified: Secondary | ICD-10-CM | POA: Diagnosis not present

## 2017-01-29 ENCOUNTER — Other Ambulatory Visit: Payer: Self-pay | Admitting: Gastroenterology

## 2017-01-29 DIAGNOSIS — R490 Dysphonia: Secondary | ICD-10-CM | POA: Diagnosis not present

## 2017-01-29 DIAGNOSIS — K219 Gastro-esophageal reflux disease without esophagitis: Secondary | ICD-10-CM | POA: Diagnosis not present

## 2017-01-29 DIAGNOSIS — R05 Cough: Secondary | ICD-10-CM | POA: Diagnosis not present

## 2017-02-01 ENCOUNTER — Ambulatory Visit (HOSPITAL_COMMUNITY): Payer: Medicare HMO | Admitting: Psychiatry

## 2017-02-01 ENCOUNTER — Encounter (HOSPITAL_COMMUNITY): Payer: Self-pay | Admitting: Psychiatry

## 2017-02-01 VITALS — BP 151/75 | HR 63 | Resp 12 | Ht 67.0 in | Wt 231.4 lb

## 2017-02-01 DIAGNOSIS — R450 Nervousness: Secondary | ICD-10-CM | POA: Diagnosis not present

## 2017-02-01 DIAGNOSIS — F419 Anxiety disorder, unspecified: Secondary | ICD-10-CM | POA: Diagnosis not present

## 2017-02-01 DIAGNOSIS — Z630 Problems in relationship with spouse or partner: Secondary | ICD-10-CM

## 2017-02-01 DIAGNOSIS — F332 Major depressive disorder, recurrent severe without psychotic features: Secondary | ICD-10-CM

## 2017-02-01 DIAGNOSIS — R69 Illness, unspecified: Secondary | ICD-10-CM | POA: Diagnosis not present

## 2017-02-01 MED ORDER — MIRTAZAPINE 15 MG PO TABS: 15.0000 mg | ORAL_TABLET | Freq: Every day | ORAL | 1 refills | Status: DC

## 2017-02-01 MED ORDER — BUPROPION HCL ER (XL) 300 MG PO TB24: 300.0000 mg | ORAL_TABLET | Freq: Every day | ORAL | 1 refills | Status: DC

## 2017-02-01 NOTE — Progress Notes (Signed)
Psychiatric Initial Adult Assessment   Patient Identification: [name] [name] MRN:  [id number] Date of Evaluation:  02/01/2017 Referral Source: Intensive outpatient program  Chief Complaint:  I still have a lot of anxiety.  Visit Diagnosis:    ICD-10-CM   1. Severe recurrent major depression without psychotic features (HCC) F33.2 buPROPion (WELLBUTRIN XL) 300 MG 24 hr tablet    mirtazapine (REMERON) 15 MG tablet    History of Present Illness: Patient is 72 year old Caucasian, married female who is referred from intensive outpatient program.  She was referred from assessment as she came in as a walk-in to behavioral health center in November 2019.  Patient was experiencing crying spells, irritability, severe depression and feeling hopelessness and worthlessness.  Patient finished the program.  Patient endorsed lot of contributing factor to her depression.  Patient has knee surgery in September, A. fib last year and having issues with her husband.  Apparently her husband wrote a romantic letter to her coworker that causes losing his job.  Patient was very upset with his behavior and since then he is having a lot of trust issues with him.  Even though patient told that husband is more nice and cooperative but she is still not get over with the incident.  Patient admitted she started to feel nervous and anxious and worried about her future.  She reported poor sleep, cannot enjoy herself.  She worried about everything.  She is having crying spells daily and she feels very sad, irritable, frustrated.  She was getting antidepressant from her primary care physician and she felt it was not working very well.  In the intensive outpatient program she was given Vistaril and BuSpar but she never filled the prescription.  She continued on Wellbutrin 300 mg daily.  Later Remeron was added and she started to notice improvement in her anxiety and sleep.  Though she feels improved from the past but she continued to have  crying spells, fatigue, lack of energy, excessive guilt about her marriage, irritability and anxiety.  Patient denies any paranoia, hallucination, suicidal thoughts, aggressive behavior, OCD or any panic attacks.  Her appetite is okay.  Her energy level is improved.  Patient lives with her husband.  Together they have been married for 53 years.  Patient has a daughter and son who live close by.  Patient denies drinking alcohol or using any illegal substances.  Associated Signs/Symptoms: Depression Symptoms:  depressed mood, feelings of worthlessness/guilt, difficulty concentrating, hopelessness, anxiety, (Hypo) Manic Symptoms:  Irritable Mood, Anxiety Symptoms:  Excessive Worry, Psychotic Symptoms:  No psychotic symptoms PTSD Symptoms: Negative  Past Psychiatric History: Patient reported history of depression most of her life.  She remember starting to get depressed when her mother died in 53s.  She is been getting antidepressant from her primary care physician.  She briefly saw Chapman Moss for a few months who continued her psych medication from primary care physician.  Recently she saw her once Dr. Marcie Bal at [place] but did not like him.  In the past she had tried fluoxetine, Cymbalta, Lexapro, Zoloft.  She had a side effects from Lexapro and Zoloft.  Patient denies any history of psychiatric inpatient treatment, paranoia, hallucination, suicidal attempt or any self abusive behavior.  Previous Psychotropic Medications: Yes   Substance Abuse History in the last 12 months:  No.  Consequences of Substance Abuse: Negative  Past Medical History:  Past Medical History:  Diagnosis Date  . Anxiety   . Arthritis   . Cataracts, bilateral  immature  . CKD (chronic kidney disease) stage 3, GFR 30-59 ml/min (HCC)   . Depression   . GERD (gastroesophageal reflux disease)    was on Nexium   . GERD (gastroesophageal reflux disease)   . Hemorrhoid   . History of bronchitis    couple of yrs  ago  . History of colon polyps   . History of peristent atrial fibrillation   . Hyperlipidemia    takes Pravastatin daily  . Hypertension    takes Amlodipine and Diovan daily  . Hypertension   . Pre-diabetes   . Sleep apnea    no cpap used  . Sleep apnea    study done 20+yrs ago and doesn't use cpap  . Urinary urgency   . Weakness    in left leg r/t back    Past Surgical History:  Procedure Laterality Date  . ABDOMINAL HYSTERECTOMY  1980   partial  . ABDOMINAL HYSTERECTOMY     partial  . BACK SURGERY    . CARDIOVERSION N/A 02/24/2016   Procedure: CARDIOVERSION;  Surgeon: Fay Records, MD;  Location: [facility] ENDOSCOPY;  Service: Cardiovascular;  Laterality: N/A;  . CARDIOVERSION N/A 03/19/2016   Procedure: CARDIOVERSION;  Surgeon: Fay Records, MD;  Location: [place];  Service: Cardiovascular;  Laterality: N/A;  . CARPAL TUNNEL RELEASE    . CHOLECYSTECTOMY  1975  . CHOLECYSTECTOMY    . COLONOSCOPY    . COLONOSCOPY WITH PROPOFOL  12/29/2011   Procedure: COLONOSCOPY WITH PROPOFOL;  Surgeon: Lear Ng, MD;  Location: [facility] ENDOSCOPY;  Service: Endoscopy;  Laterality: N/A;  . COLONOSCOPY WITH PROPOFOL N/A 05/24/2014   Procedure: COLONOSCOPY WITH PROPOFOL;  Surgeon: Wilford Corner, MD;  Location: [facility] ENDOSCOPY;  Service: Endoscopy;  Laterality: N/A;  . ESOPHAGOGASTRODUODENOSCOPY    . FOOT SURGERY     left, right  . FRACTURE SURGERY     left leg-knee  . HEEL SPUR EXCISION Bilateral   . HOT HEMOSTASIS  12/29/2011   Procedure: HOT HEMOSTASIS (ARGON PLASMA COAGULATION/BICAP);  Surgeon: Lear Ng, MD;  Location: [facility] ENDOS[facility];  Service: Endoscopy;  Laterality: N/A;  . HOT HEMOSTASIS N/A 05/24/2014   Procedure: HOT HEMOSTASIS (ARGON PLASMA COAGULATION/BICAP);  Surgeon: Wilford Corner, MD;  Location: [facility] ENDOSCOPY;  Service: Endoscopy;  Laterality: N/A;  . leg surgery d/t break Left   . LUMBAR LAMINECTOMY/DECOMPRESSION MICRODISCECTOMY Left 04/05/2013   Procedure: LUMBAR  FOUR TO FIVE LUMBAR LAMINECTOMY/DECOMPRESSION MICRODISCECTOMY 1 LEVEL;  Surgeon: Eustace Moore, MD;  Location: [place] [facility]URO ORS;  Service: Neurosurgery;  Laterality: Left;  . NISSEN FUNDOPLICATION    . TEE WITHOUT CARDIOVERSION N/A 02/24/2016   Procedure: TRANSESOPHAGEAL ECHOCARDIOGRAM (TEE);  Surgeon: Fay Records, MD;  Location: [place];  Service: Cardiovascular;  Laterality: N/A;  . TOTAL KNEE ARTHROPLASTY Left 10/07/2016  . TOTAL KNEE ARTHROPLASTY Left 10/07/2016   Procedure: TOTAL KNEE ARTHROPLASTY;  Surgeon: [name], MD;  Location: [place];  Service: Orthopedics;  Laterality: Left;    Family Psychiatric History:.  Reviewed  Family History:  Family History  Problem Relation Age of Onset  . Colon cancer Mother   . Heart disease Father     Social History:   Social History   Socioeconomic History  . Marital status: Married    Spouse name: Dominica Severin  . Number of children: 2  . Years of education: 19  . Highest education level: Some college, no degree  Social Needs  . Financial resource strain: Not very hard  . Food  insecurity - worry: Patient refused  . Food insecurity - inability: Patient refused  . Transportation needs - medical: Patient refused  . Transportation needs - non-medical: Patient refused  Occupational History  . None  Tobacco Use  . Smoking status: Never Smoker  . Smokeless tobacco: Never Used  Substance and Sexual Activity  . Alcohol use: No  . Drug use: No  . Sexual activity: Yes    Birth control/protection: Surgical  Other Topics Concern  . None  Social History Narrative   ** Merged History Encounter **        Additional Social History: Patient born and raised in [place].  She is been married for 53 years.  She worked at [employer] and currently retired.  She has 2 biological children and she raised 2 nephew and niece.  Patient had a good support from her children.  Her husband was working at [employer] but in April when he wrote a romantic  letter to his coworker he lost his job.  Allergies:   Allergies  Allergen Reactions  . Lexapro [Escitalopram]     Makes her feel funny  . Peroxide [Hydrogen Peroxide] Other (See Comments)    Redness.   [place] [Sertraline Hcl]     Makes her feel funny  . Neosporin [Neomycin-Bacitracin Zn-Polymyx] Rash    Blisters, itching.     Metabolic Disorder Labs: Lab Results  Component Value Date   HGBA1C 5.8 (H) 09/25/2016   MPG 119.76 09/25/2016   MPG 120 02/17/2016   No results found for: PROLACTIN Lab Results  Component Value Date   CHOL 125 02/18/2016   TRIG 90 02/18/2016   HDL 41 02/18/2016   CHOLHDL 3.0 02/18/2016   VLDL 18 02/18/2016   LDLCALC 66 02/18/2016     Current Medications: Current Outpatient Medications  Medication Sig Dispense Refill  . amiodarone (PACERONE) 200 MG tablet Take 1 tablet (200 mg total) by mouth daily. 90 tablet 2  . apixaban (ELIQUIS) 5 MG TABS tablet Take 1 tablet (5 mg total) by mouth 2 (two) times daily. 60 tablet 6  . Ascorbic Acid (VITAMIN C WITH ROSE HIPS) 1000 MG tablet Take 1,000 mg by mouth daily.    Marland Kitchen buPROPion (WELLBUTRIN XL) 300 MG 24 hr tablet Take 1/2 tablet (150 mg ) daily 30 tablet 0  . busPIRone (BUSPAR) 10 MG tablet     . CALCIUM PO Take 1 tablet by mouth every morning.    . Cholecalciferol (VITAMIN D) 2000 UNITS tablet Take 4,000 Units by mouth daily.    . [employer] MELATONIN PO Take 1 tablet by mouth at bedtime as needed.    . fexofenadine (ALLEGRA) 180 MG tablet Take 180 mg by mouth daily as needed for allergies or rhinitis.    . furosemide (LASIX) 20 MG tablet TAKE 1 TABLET BY MOUTH ONCE DAILY 90 tablet 2  . hydrOXYzine (ATARAX/VISTARIL) 10 MG tablet Take one bid as needed for anxiety 30 tablet 1  . KLOR-CON M20 20 MEQ tablet TAKE 1 TABLET BY MOUTH ONCE DAILY 90 tablet 2  . metoprolol succinate (TOPROL-XL) 25 MG 24 hr tablet TAKE 1/2 (ONE-HALF) TABLET BY MOUTH ONCE DAILY 30 tablet 10  . mirtazapine (REMERON) 7.5 MG tablet Take 1  tablet (7.5 mg total) at bedtime by mouth. 30 tablet 1  . ondansetron (ZOFRAN) 4 MG tablet Take 1 tablet (4 mg total) by mouth every 8 (eight) hours as needed for nausea or vomiting. 40 tablet 0  . oxyCODONE-acetaminophen (  ROXICET) 5-325 MG tablet Take 1-2 tablets by mouth every 4 (four) hours as needed. 60 tablet 0  . pravastatin (PRAVACHOL) 40 MG tablet Take 40 mg by mouth at bedtime.     No current facility-administered medications for this visit.     Neurologic: Headache: No Seizure: No Paresthesias:No  Musculoskeletal: Strength & Muscle Tone: within normal limits Gait & Station: normal Patient leans: N/A  Psychiatric Specialty Exam: Review of Systems  Constitutional: Negative.   HENT: Negative.   Respiratory: Negative.   Musculoskeletal: Negative.   Skin: Negative.   Neurological: Negative.   Psychiatric/Behavioral: Positive for depression. The patient is nervous/anxious.     Blood pressure (!) 151/75, pulse 63, resp. rate 12, height 5\' 7"  (1.702 m), weight 231 lb 6.4 oz (105 kg).Body mass index is 36.24 kg/m.  General Appearance: Well Groomed  Eye Contact:  Good  Speech:  Clear and Coherent  Volume:  Decreased  Mood:  Anxious  Affect:  Congruent  Thought Process:  Goal Directed  Orientation:  Full (Time, Place, and Person)  Thought Content:  Logical and Rumination  Suicidal Thoughts:  No  Homicidal Thoughts:  No  Memory:  Immediate;   Good Recent;   Fair Remote;   Fair  Judgement:  Good  Insight:  Good  Psychomotor Activity:  Normal  Concentration:  Concentration: Fair and Attention Span: Fair  Recall:  [employer] of Knowledge:Good  Language: Good  Akathisia:  No  Handed:  Right  AIMS (if indicated):  0  Assets:  Communication Skills Desire for Improvement Housing Resilience Social Support  ADL's:  Intact  Cognition: WNL  Sleep: Fair   Assessment: Major depressive disorder, recurrent.  Anxiety disorder NOS.  Plan: I review her symptoms,  history, current medication, psychosocial stressors and information from other providers.  Patient is still have a lot of anxiety and depression.  She is taking Wellbutrin XL 300 mg daily.  She never filled the prescription of hydroxyzine and BuSpar.  She is taking Remeron 7.5 mg at bedtime.  She also takes over-the-counter melatonin for insomnia.  Overall her sleep is improved from the past but she still have a lot of guilt about her marriage.  She is scheduled to see Janett Billow on March 13 for CBT.  I recommended to try Remeron 15 mg at bedtime.  Continue Wellbutrin XL 300 mg daily.  She has no side effects.  Discussed medication side effects and benefits.  Recommended to keep appointment with Janett Billow for therapy.  Recommended to call us back if she has any question, concern if she feels worsening of the symptoms.  Discussed safety concerns at any time having active suicidal thoughts or homicidal thought that she need to call 911 or go to local emergency room.  Follow-up in 4-6 weeks.  If patient do not see any improvement with increased Remeron we will consider switching to Effexor.  Kathlee Nations, MD 1/21/201911:13 AM

## 2017-02-02 DIAGNOSIS — R69 Illness, unspecified: Secondary | ICD-10-CM | POA: Diagnosis not present

## 2017-02-15 ENCOUNTER — Other Ambulatory Visit: Payer: Self-pay | Admitting: Nurse Practitioner

## 2017-02-19 ENCOUNTER — Telehealth: Payer: Self-pay | Admitting: Internal Medicine

## 2017-02-19 MED ORDER — APIXABAN 5 MG PO TABS: 5.0000 mg | ORAL_TABLET | Freq: Two times a day (BID) | ORAL | 2 refills | Status: DC

## 2017-02-19 NOTE — Telephone Encounter (Signed)
Please review for refill. Thanks!  

## 2017-02-19 NOTE — Addendum Note (Signed)
Addended by: Derrel Nip B on: 02/19/2017 10:32 AM   Modules accepted: Orders

## 2017-02-19 NOTE — Telephone Encounter (Addendum)
Eliquis 5mg  refill request received; pt is 72 yrs old, wt-104.8kg, Crea-1.34 on 10/30/16, last seen by Dr. Fletcher Anon on 10/30/16. Will send in refill to requested pharmacy as pt is on the appropiate dose.

## 2017-02-19 NOTE — Telephone Encounter (Signed)
°*  STAT* If patient is at the pharmacy, call can be transferred to refill team.   1. Which medications need to be refilled? (please list name of each medication and dose if known)  Eliquis   2. Which pharmacy/location (including street and city if local pharmacy) is medication to be sent to? [facility] on [address]   3. Do they need a 30 day or 90 day supply? 90 day

## 2017-02-22 ENCOUNTER — Ambulatory Visit (HOSPITAL_COMMUNITY)
Admission: RE | Admit: 2017-02-22 | Discharge: 2017-02-22 | Disposition: A | Discharge status: Home / Self Care | Payer: Medicare HMO | Source: Ambulatory Visit | Attending: Gastroenterology | Admitting: Gastroenterology

## 2017-02-22 ENCOUNTER — Encounter (HOSPITAL_COMMUNITY)
Admission: RE | Disposition: A | Discharge status: Home / Self Care | Payer: Self-pay | Source: Ambulatory Visit | Attending: Gastroenterology

## 2017-02-22 DIAGNOSIS — R05 Cough: Secondary | ICD-10-CM | POA: Diagnosis not present

## 2017-02-22 DIAGNOSIS — K219 Gastro-esophageal reflux disease without esophagitis: Secondary | ICD-10-CM | POA: Insufficient documentation

## 2017-02-22 DIAGNOSIS — R490 Dysphonia: Secondary | ICD-10-CM | POA: Diagnosis not present

## 2017-02-22 HISTORY — PX: ESOPHAGEAL MANOMETRY: SHX5429

## 2017-02-22 HISTORY — PX: PH IMPEDANCE STUDY: SHX5565

## 2017-02-22 SURGERY — MANOMETRY, ESOPHAGUS

## 2017-02-22 MED ORDER — LIDOCAINE VISCOUS 2 % MT SOLN
OROMUCOSAL | Status: AC
  Filled 2017-02-22: qty 15

## 2017-02-22 SURGICAL SUPPLY — 2 items
FACESHIELD LNG OPTICON STERILE (SAFETY) IMPLANT
GLOVE BIO SURGEON STRL SZ8 (GLOVE) ×4 IMPLANT

## 2017-02-22 NOTE — Progress Notes (Signed)
Esophageal manometry performed per protocol.  Patient tolerated well.  No blood noted on the probe after removal.  PH probe placed per protocol at 37cm.  Patient instructed regarding study and use of monitor.  Patient verbalized understanding.  Patient to return to endoscopy at 9:20 tomorrow to have probe removed.  Dr. Michail Sermon to read study.

## 2017-02-23 ENCOUNTER — Encounter (HOSPITAL_COMMUNITY): Payer: Self-pay | Admitting: Gastroenterology

## 2017-03-01 ENCOUNTER — Telehealth: Payer: Self-pay | Admitting: Internal Medicine

## 2017-03-01 DIAGNOSIS — I10 Essential (primary) hypertension: Secondary | ICD-10-CM

## 2017-03-01 DIAGNOSIS — L659 Nonscarring hair loss, unspecified: Secondary | ICD-10-CM

## 2017-03-01 NOTE — Telephone Encounter (Signed)
S/w patient. She says she did some research and amiodarone and metoprolol list losing hair as a side effect. She states she has 3 balding areas.  Advised I would route to Dr End for his advice; however, also suggested she follow up with her PCP.

## 2017-03-01 NOTE — Telephone Encounter (Signed)
Pt states he hair is falling out, and she thinks it is coming from her medication. Possibly her amiodarone or metoprolol. Please call to discuss. States she is also on Eliquis.

## 2017-03-02 NOTE — Telephone Encounter (Signed)
Hair loss is possible with both agents. Given her difficult to control a-fib in the past, I am hesitant to discontinue these medications. If needed, I would favor stopping metoprolol first. However, I would recommend that we recheck thyroid function studies (TSH and free T4) before making any medication changes.  Nelva Bush, MD [facility] Pager: [phone]

## 2017-03-02 NOTE — Telephone Encounter (Signed)
S/w patient. She is agreeable to lab work. She will go to the [address] office tomorrow for the lab for sometime between 3-4:30pm.

## 2017-03-03 ENCOUNTER — Other Ambulatory Visit: Payer: Medicare HMO | Admitting: *Deleted

## 2017-03-03 DIAGNOSIS — I10 Essential (primary) hypertension: Secondary | ICD-10-CM

## 2017-03-03 DIAGNOSIS — L659 Nonscarring hair loss, unspecified: Secondary | ICD-10-CM

## 2017-03-04 LAB — TSH: TSH: 3.73 u[IU]/mL (ref 0.450–4.500)

## 2017-03-04 LAB — T4, FREE: Free T4: 1.31 ng/dL (ref 0.82–1.77)

## 2017-03-09 DIAGNOSIS — R05 Cough: Secondary | ICD-10-CM | POA: Diagnosis not present

## 2017-03-09 DIAGNOSIS — R490 Dysphonia: Secondary | ICD-10-CM | POA: Diagnosis not present

## 2017-03-09 DIAGNOSIS — K219 Gastro-esophageal reflux disease without esophagitis: Secondary | ICD-10-CM | POA: Diagnosis not present

## 2017-03-10 DIAGNOSIS — L650 Telogen effluvium: Secondary | ICD-10-CM | POA: Diagnosis not present

## 2017-03-10 DIAGNOSIS — L908 Other atrophic disorders of skin: Secondary | ICD-10-CM | POA: Diagnosis not present

## 2017-03-15 ENCOUNTER — Encounter (HOSPITAL_COMMUNITY): Payer: Self-pay | Admitting: Psychiatry

## 2017-03-15 ENCOUNTER — Ambulatory Visit (HOSPITAL_COMMUNITY): Payer: Medicare HMO | Admitting: Psychiatry

## 2017-03-15 DIAGNOSIS — F332 Major depressive disorder, recurrent severe without psychotic features: Secondary | ICD-10-CM | POA: Diagnosis not present

## 2017-03-15 DIAGNOSIS — F419 Anxiety disorder, unspecified: Secondary | ICD-10-CM | POA: Diagnosis not present

## 2017-03-15 DIAGNOSIS — R05 Cough: Secondary | ICD-10-CM

## 2017-03-15 DIAGNOSIS — R69 Illness, unspecified: Secondary | ICD-10-CM | POA: Diagnosis not present

## 2017-03-15 MED ORDER — MIRTAZAPINE 15 MG PO TABS: 15.0000 mg | ORAL_TABLET | Freq: Every day | ORAL | 0 refills | Status: DC

## 2017-03-15 MED ORDER — BUPROPION HCL ER (XL) 300 MG PO TB24: 300.0000 mg | ORAL_TABLET | Freq: Every day | ORAL | 0 refills | Status: DC

## 2017-03-15 NOTE — Progress Notes (Signed)
[place] MD/PA/NP OP Progress Note  03/15/2017 8:34 AM [name] [name]  MRN:  [id number]  Chief Complaint: I am doing better with increased night medication.  I am sleeping good.  HPI: Patient came for her follow-up appointment.  She is a 72 year old Caucasian female who was seen first time 6 weeks ago.  She was referred from intensive outpatient program.  She is now taking Wellbutrin XL 300 mg and we recommended to try Remeron 15 mg.  She is feeling much better.  Her sleep is improved.  She denies any crying spells or any major panic attack.  She is pleased that husband is very supportive and helpful.  She admitted weight gain because she has no control on her eating.  She admitted not doing exercise.  She is still concerned about her chronic cough which she has for a long time.  She seen recently physician and recommended acid reflux medication and she tried Nexium for a long time but her chronic cough did not improve.  Now she is scheduled to see pulmonologist.  Overall she describes her anxiety and depression is much better.  She denies any irritability, anger, mania or any psychosis.  Her energy level is good.  She started doing things which she has not in a while.  Patient is scheduled to see a therapist on March 13.  Patient denies drinking alcohol or using any illegal substances.  Patient is still have guilt about her marriage but lately relationship with her husband is much improved.  Patient denies drinking alcohol or using any illegal substances.  She lives with her husband.  Patient has a daughter and son and they live close by.  Visit Diagnosis:    ICD-10-CM   1. Severe recurrent major depression without psychotic features (HCC) F33.2 mirtazapine (REMERON) 15 MG tablet    buPROPion (WELLBUTRIN XL) 300 MG 24 hr tablet    Past Psychiatric History: Reviewed. Patient had a history of depression most of her life.  She started taking medication when her mother died in 88.  She briefly saw [facility] but continued her psych medication from primary care physician.  In the past she had tried Prozac, Cymbalta, Lexapro, Zoloft.  She remember having side effects from Lexapro and Zoloft.  Patient finished intensive outpatient program and prescribed Remeron and Wellbutrin.  Patient denies any history of paranoia, suicidal attempt or any psychosis.  Patient was prescribed BuSpar and Vistaril and intensive outpatient program but she never filled the prescription.  Past Medical History:  Past Medical History:  Diagnosis Date  . Anxiety   . Arthritis   . Cataracts, bilateral    immature  . CKD (chronic kidney disease) stage 3, GFR 30-59 ml/min (HCC)   . Depression   . GERD (gastroesophageal reflux disease)    was on Nexium   . GERD (gastroesophageal reflux disease)   . Hemorrhoid   . History of bronchitis    couple of yrs ago  . History of colon polyps   . History of peristent atrial fibrillation   . Hyperlipidemia    takes Pravastatin daily  . Hypertension    takes Amlodipine and Diovan daily  . Hypertension   . Pre-diabetes   . Sleep apnea    no cpap used  . Sleep apnea    study done 20+yrs ago and doesn't use cpap  . Urinary urgency   . Weakness    in left leg r/t back    Past Surgical History:  Procedure Laterality  Date  . ABDOMINAL HYSTERECTOMY  1980   partial  . ABDOMINAL HYSTERECTOMY     partial  . BACK SURGERY    . CARDIOVERSION N/A 02/24/2016   Procedure: CARDIOVERSION;  Surgeon: Fay Records, MD;  Location: [facility] ENDOSCOPY;  Service: Cardiovascular;  Laterality: N/A;  . CARDIOVERSION N/A 03/19/2016   Procedure: CARDIOVERSION;  Surgeon: Fay Records, MD;  Location: [place];  Service: Cardiovascular;  Laterality: N/A;  . CARPAL TUNNEL RELEASE    . CHOLECYSTECTOMY  1975  . CHOLECYSTECTOMY    . COLONOSCOPY    . COLONOSCOPY WITH PROPOFOL  12/29/2011   Procedure: COLONOSCOPY WITH PROPOFOL;  Surgeon: Lear Ng, MD;  Location: [facility] ENDOSCOPY;  Service:  Endoscopy;  Laterality: N/A;  . COLONOSCOPY WITH PROPOFOL N/A 05/24/2014   Procedure: COLONOSCOPY WITH PROPOFOL;  Surgeon: Wilford Corner, MD;  Location: [facility] ENDOSCOPY;  Service: Endoscopy;  Laterality: N/A;  . ESOPHAGEAL MANOMETRY N/A 02/22/2017   Procedure: ESOPHAGEAL MANOMETRY (EM);  Surgeon: Wilford Corner, MD;  Location: [facility] ENDOSCOPY;  Service: Endoscopy;  Laterality: N/A;  . ESOPHAGOGASTRODUODENOSCOPY    . FOOT SURGERY     left, right  . FRACTURE SURGERY     left leg-knee  . HEEL SPUR EXCISION Bilateral   . HOT HEMOSTASIS  12/29/2011   Procedure: HOT HEMOSTASIS (ARGON PLASMA COAGULATION/BICAP);  Surgeon: Lear Ng, MD;  Location: [facility];  Service: Endoscopy;  Laterality: N/A;  . HOT HEMOSTASIS N/A 05/24/2014   Procedure: HOT HEMOSTASIS (ARGON PLASMA COAGULATION/BICAP);  Surgeon: Wilford Corner, MD;  Location: [facility];  Service: Endoscopy;  Laterality: N/A;  . leg surgery d/t break Left   . LUMBAR LAMINECTOMY/DECOMPRESSION MICRODISCECTOMY Left 04/05/2013   Procedure: LUMBAR FOUR TO FIVE LUMBAR LAMINECTOMY/DECOMPRESSION MICRODISCECTOMY 1 LEVEL;  Surgeon: Eustace Moore, MD;  Location: [facility];  Service: Neurosurgery;  Laterality: Left;  . NISSEN FUNDOPLICATION    . [place] IMPEDANCE STUDY N/A 02/22/2017   Procedure: [place] IMPEDANCE STUDY;  Surgeon: Wilford Corner, MD;  Location: [facility] ENDOSCOPY;  Service: Endoscopy;  Laterality: N/A;  . TEE WITHOUT CARDIOVERSION N/A 02/24/2016   Procedure: TRANSESOPHAGEAL ECHOCARDIOGRAM (TEE);  Surgeon: Fay Records, MD;  Location: [place];  Service: Cardiovascular;  Laterality: N/A;  . TOTAL KNEE ARTHROPLASTY Left 10/07/2016  . TOTAL KNEE ARTHROPLASTY Left 10/07/2016   Procedure: TOTAL KNEE ARTHROPLASTY;  Surgeon: [name], MD;  Location: [place];  Service: Orthopedics;  Laterality: Left;    Family Psychiatric History: Reviewed.  Family History:  Family History  Problem Relation Age of Onset  . Colon cancer Mother   .  Heart disease Father     Social History:  Social History   Socioeconomic History  . Marital status: Married    Spouse name: Dominica Severin  . Number of children: 2  . Years of education: 70  . Highest education level: Some college, no degree  Social Needs  . Financial resource strain: Not very hard  . Food insecurity - worry: Patient refused  . Food insecurity - inability: Patient refused  . Transportation needs - medical: Patient refused  . Transportation needs - non-medical: Patient refused  Occupational History  . None  Tobacco Use  . Smoking status: Never Smoker  . Smokeless tobacco: Never Used  Substance and Sexual Activity  . Alcohol use: No  . Drug use: No  . Sexual activity: Yes    Birth control/protection: Surgical  Other Topics Concern  . None  Social History Narrative   ** Merged History Encounter **  Allergies:  Allergies  Allergen Reactions  . Lexapro [Escitalopram]     Makes her feel funny  . Peroxide [Hydrogen Peroxide] Other (See Comments)    Redness.   [place] [Sertraline Hcl]     Makes her feel funny  . Neosporin [Neomycin-Bacitracin Zn-Polymyx] Rash    Blisters, itching.     Metabolic Disorder Labs: Recent Results (from the past 2160 hour(s))  TSH     Status: None   Collection Time: 03/03/17  2:33 PM  Result Value Ref Range   TSH 3.730 0.450 - 4.500 uIU/mL  T4, free     Status: None   Collection Time: 03/03/17  2:33 PM  Result Value Ref Range   Free T4 1.31 0.82 - 1.77 ng/dL   Lab Results  Component Value Date   HGBA1C 5.8 (H) 09/25/2016   MPG 119.76 09/25/2016   MPG 120 02/17/2016   No results found for: PROLACTIN Lab Results  Component Value Date   CHOL 125 02/18/2016   TRIG 90 02/18/2016   HDL 41 02/18/2016   CHOLHDL 3.0 02/18/2016   VLDL 18 02/18/2016   LDLCALC 66 02/18/2016   Lab Results  Component Value Date   TSH 3.730 03/03/2017   TSH 4.060 10/30/2016    Therapeutic Level Labs: No results found for: LITHIUM No  results found for: VALPROATE No components found for:  CBMZ  Current Medications: Current Outpatient Medications  Medication Sig Dispense Refill  . amiodarone (PACERONE) 200 MG tablet TAKE 1 TABLET BY MOUTH DAILY 90 tablet 2  . apixaban (ELIQUIS) 5 MG TABS tablet Take 1 tablet (5 mg total) by mouth 2 (two) times daily. 180 tablet 2  . Ascorbic Acid (VITAMIN C WITH ROSE HIPS) 1000 MG tablet Take 1,000 mg by mouth daily.    Marland Kitchen BIOTIN PO Take by mouth.    Marland Kitchen buPROPion (WELLBUTRIN XL) 300 MG 24 hr tablet Take 1 tablet (300 mg total) by mouth daily. 30 tablet 1  . CALCIUM PO Take 1 tablet by mouth every morning.    . Cholecalciferol (VITAMIN D) 2000 UNITS tablet Take 4,000 Units by mouth daily.    . [facility] MELATONIN PO Take 1 tablet by mouth at bedtime as needed.    Marland Kitchen esomeprazole (NEXIUM) 40 MG capsule     . fexofenadine (ALLEGRA) 180 MG tablet Take 180 mg by mouth daily as needed for allergies or rhinitis.    . furosemide (LASIX) 20 MG tablet TAKE 1 TABLET BY MOUTH ONCE DAILY 90 tablet 2  . KLOR-CON M20 20 MEQ tablet TAKE 1 TABLET BY MOUTH ONCE DAILY 90 tablet 2  . metoprolol succinate (TOPROL-XL) 25 MG 24 hr tablet TAKE 1/2 (ONE-HALF) TABLET BY MOUTH ONCE DAILY 30 tablet 10  . mirtazapine (REMERON) 15 MG tablet Take 1 tablet (15 mg total) by mouth at bedtime. 30 tablet 1  . pravastatin (PRAVACHOL) 40 MG tablet Take 40 mg by mouth at bedtime.     No current facility-administered medications for this visit.      Musculoskeletal: Strength & Muscle Tone: within normal limits Gait & Station: normal Patient leans: N/A  Psychiatric Specialty Exam: Review of Systems  Constitutional: Negative for weight loss.  Respiratory: Positive for cough.   Skin: Negative.     Blood pressure (!) 152/76, pulse 80, height 5\' 7"  (1.702 m), weight 239 lb 6.4 oz (108.6 kg).Body mass index is 37.5 kg/m.  General Appearance: Casual  Eye Contact:  Good  Speech:  Clear and Coherent  Volume:  Normal  Mood:   Euthymic  Affect:  Congruent  Thought Process:  Goal Directed  Orientation:  Full (Time, Place, and Person)  Thought Content: Logical   Suicidal Thoughts:  No  Homicidal Thoughts:  No  Memory:  Immediate;   Good Recent;   Good Remote;   Good  Judgement:  Good  Insight:  Good  Psychomotor Activity:  Normal  Concentration:  Concentration: Good and Attention Span: Good  Recall:  Good  Fund of Knowledge: Good  Language: Good  Akathisia:  No  Handed:  Right  AIMS (if indicated): not done  Assets:  Communication Skills Desire for Improvement Housing Resilience  ADL's:  Intact  Cognition: WNL  Sleep:  Good   Screenings:   Assessment and Plan: Major depressive disorder, recurrent.  Anxiety disorder NOS.  Reassurance given.  Recommended to keep appointment with pulmonologist for chronic cough and also with Janett Billow for CBT.  Encouraged to watch her calorie intake and do regular exercise.  Patient gained weight since the last visit.  Patient does not want to change her medication because it is working very well however we discussed that if patient continued to gain weight then we may need to change to Remeron.  For now continue Remeron 15 mg at bedtime and Wellbutrin XL 300 mg daily.  Discussed medication side effects and benefits.  Recommended to call us back if she has any question, concern if she feels worsening of the symptoms.  Follow-up in 3 months.   Kathlee Nations, MD 03/15/2017, 8:34 AM

## 2017-03-19 ENCOUNTER — Ambulatory Visit: Payer: Medicare HMO | Admitting: Internal Medicine

## 2017-03-19 ENCOUNTER — Encounter: Payer: Self-pay | Admitting: Adult Health

## 2017-03-19 ENCOUNTER — Other Ambulatory Visit (INDEPENDENT_AMBULATORY_CARE_PROVIDER_SITE_OTHER): Payer: Medicare HMO

## 2017-03-19 ENCOUNTER — Ambulatory Visit: Payer: Medicare HMO | Admitting: Adult Health

## 2017-03-19 DIAGNOSIS — R058 Other specified cough: Secondary | ICD-10-CM | POA: Insufficient documentation

## 2017-03-19 DIAGNOSIS — J310 Chronic rhinitis: Secondary | ICD-10-CM

## 2017-03-19 DIAGNOSIS — R05 Cough: Secondary | ICD-10-CM | POA: Diagnosis not present

## 2017-03-19 DIAGNOSIS — J309 Allergic rhinitis, unspecified: Secondary | ICD-10-CM

## 2017-03-19 DIAGNOSIS — R059 Cough, unspecified: Secondary | ICD-10-CM

## 2017-03-19 HISTORY — DX: Allergic rhinitis, unspecified: J30.9

## 2017-03-19 HISTORY — DX: Other specified cough: R05.8

## 2017-03-19 LAB — CBC WITH DIFFERENTIAL/PLATELET
BASOS ABS: 0.1 10*3/uL (ref 0.0–0.1)
Basophils Relative: 1.1 % (ref 0.0–3.0)
EOS ABS: 0.3 10*3/uL (ref 0.0–0.7)
Eosinophils Relative: 4.9 % (ref 0.0–5.0)
HEMATOCRIT: 39.8 % (ref 36.0–46.0)
Hemoglobin: 13.3 g/dL (ref 12.0–15.0)
LYMPHS PCT: 38.4 % (ref 12.0–46.0)
Lymphs Abs: 2.1 10*3/uL (ref 0.7–4.0)
MCHC: 33.5 g/dL (ref 30.0–36.0)
MCV: 93.7 fl (ref 78.0–100.0)
Monocytes Absolute: 0.7 10*3/uL (ref 0.1–1.0)
Monocytes Relative: 13.1 % — ABNORMAL HIGH (ref 3.0–12.0)
Neutro Abs: 2.4 10*3/uL (ref 1.4–7.7)
Neutrophils Relative %: 42.5 % — ABNORMAL LOW (ref 43.0–77.0)
Platelets: 188 10*3/uL (ref 150.0–400.0)
RBC: 4.25 Mil/uL (ref 3.87–5.11)
RDW: 14.8 % (ref 11.5–15.5)
WBC: 5.6 10*3/uL (ref 4.0–10.5)

## 2017-03-19 LAB — SEDIMENTATION RATE: SED RATE: 20 mm/h (ref 0–30)

## 2017-03-19 LAB — NITRIC OXIDE: Nitric Oxide: 15

## 2017-03-19 MED ORDER — BENZONATATE 200 MG PO CAPS: 200.0000 mg | ORAL_CAPSULE | Freq: Three times a day (TID) | ORAL | 3 refills | Status: DC | PRN

## 2017-03-19 MED ORDER — PREDNISONE 10 MG PO TABS: ORAL_TABLET | ORAL | 0 refills | Status: DC

## 2017-03-19 NOTE — Progress Notes (Signed)
_0  ID: [name] [name], female    DOB: [birth year], 72 y.o.   MRN: [id number]  Chief Complaint  Patient presents with  . Follow-up    Cough     Referring provider: Harlan Stains, MD  HPI: 72 yo female never smoker followed for chronic cough present since 2009 (worse since 2016)  A Fib on Amiodarone (started 02/2017)   Chronic cough hx >recurrent productive cough  Neg CXR  ENT evaluation 2019 neg per pt  GI w/up neg for GERD per pt  Basement but does not use No hot tub use( has one but not functioning )  No chickens, birds, occupatonal /hobby exposure.  No Autoimmune , no macrodantin use.  PFT October 2018 showed FEV1 at 88%, ratio 80, FVC 83%, no significant bronchodilator response ,mid flow reversibility, DLCO 71%  03/19/2017 Acute OV : Cough  Patient returns for a persistent cough.  Patient was seen 6 months ago for an ongoing chronic cough.  She has been on multiple treatments over the years with inhalers, steroids, antibiotics without significant improvement.  Says the cough seems to wax and wane it gets better than comes back.  She says over the last 3 months her cough seems to be getting worse.  And over the last 3 weeks she has severe coughing episodes.  She has not been on any antibiotics steroids recently.  She does take an Human resources officer daily.  And was previously using reflux medications but has recently seen her GI doctor and told that if she does not have any reflux.  Uses cough drops often but does not use any MINT products.  Previous PFT in October 2018 showed no airflow obstruction or restriction.  There was a mild diffusing defect. She does have some postnasal drip symptoms. Denies any hemoptysis, chest pain, orthopnea, edema, nausea vomiting, overt reflux, joint swelling or rash. Exhaled nitric oxide testing 15 ppb today  She was recently diagnosed with A. fib and is on amiodarone.  Cough is been present long before amiodarone was started.    Allergies  Allergen  Reactions  . Lexapro [Escitalopram]     Makes her feel funny  . Peroxide [Hydrogen Peroxide] Other (See Comments)    Redness.   [place] [Sertraline Hcl]     Makes her feel funny  . Neosporin [Neomycin-Bacitracin Zn-Polymyx] Rash    Blisters, itching.     Immunization History  Administered Date(s) Administered  . Influenza, High Dose Seasonal PF 10/09/2016    Past Medical History:  Diagnosis Date  . Anxiety   . Arthritis   . Cataracts, bilateral    immature  . CKD (chronic kidney disease) stage 3, GFR 30-59 ml/min (HCC)   . Depression   . GERD (gastroesophageal reflux disease)    was on Nexium   . GERD (gastroesophageal reflux disease)   . Hemorrhoid   . History of bronchitis    couple of yrs ago  . History of colon polyps   . History of peristent atrial fibrillation   . Hyperlipidemia    takes Pravastatin daily  . Hypertension    takes Amlodipine and Diovan daily  . Hypertension   . Pre-diabetes   . Sleep apnea    no cpap used  . Sleep apnea    study done 20+yrs ago and doesn't use cpap  . Urinary urgency   . Weakness    in left leg r/t back    Tobacco History: Social History   Tobacco Use  Smoking Status  Never Smoker  Smokeless Tobacco Never Used   Counseling given: Not Answered   Outpatient Encounter Medications as of 03/19/2017  Medication Sig  . amiodarone (PACERONE) 200 MG tablet TAKE 1 TABLET BY MOUTH DAILY  . apixaban (ELIQUIS) 5 MG TABS tablet Take 1 tablet (5 mg total) by mouth 2 (two) times daily.  . Ascorbic Acid (VITAMIN C WITH ROSE HIPS) 1000 MG tablet Take 1,000 mg by mouth daily.  . BIOTIN PO Take by mouth.  . buPROPion (WELLBUTRIN XL) 300 MG 24 hr tablet Take 1 tablet (300 mg total) by mouth daily.  . CALCIUM PO Take 1 tablet by mouth every morning.  . Cholecalciferol (VITAMIN D) 2000 UNITS tablet Take 4,000 Units by mouth daily.  . [facility] MELATONIN PO Take 1 tablet by mouth at bedtime as needed.  . fexofenadine (ALLEGRA) 180 MG tablet  Take 180 mg by mouth daily as needed for allergies or rhinitis.  . furosemide (LASIX) 20 MG tablet TAKE 1 TABLET BY MOUTH ONCE DAILY  . KLOR-CON M20 20 MEQ tablet TAKE 1 TABLET BY MOUTH ONCE DAILY  . metoprolol succinate (TOPROL-XL) 25 MG 24 hr tablet TAKE 1/2 (ONE-HALF) TABLET BY MOUTH ONCE DAILY  . mirtazapine (REMERON) 15 MG tablet Take 1 tablet (15 mg total) by mouth at bedtime.  . pravastatin (PRAVACHOL) 40 MG tablet Take 40 mg by mouth at bedtime.  . benzonatate (TESSALON) 200 MG capsule Take 1 capsule (200 mg total) by mouth 3 (three) times daily as needed for cough.  . predniSONE (DELTASONE) 10 MG tablet 4 tabs for 2 days, then 3 tabs for 2 days, 2 tabs for 2 days, then 1 tab for 2 days, then stop   No facility-administered encounter medications on file as of 03/19/2017.      Review of Systems  Constitutional:   No  weight loss, night sweats,  Fevers, chills, fatigue, or  lassitude.  HEENT:   No headaches,  Difficulty swallowing,  Tooth/dental problems, or  Sore throat,                No sneezing, itching, ear ache,  +nasal congestion, post nasal drip,   CV:  No chest pain,  Orthopnea, PND, swelling in lower extremities, anasarca, dizziness, palpitations, syncope.   GI  No heartburn, indigestion, abdominal pain, nausea, vomiting, diarrhea, change in bowel habits, loss of appetite, bloody stools.   Resp: .  No chest wall deformity  Skin: no rash or lesions.  GU: no dysuria, change in color of urine, no urgency or frequency.  No flank pain, no hematuria   MS:  No joint pain or swelling.  No decreased range of motion.  No back pain.    Physical Exam  BP 138/84 (BP Location: Left Arm, Cuff Size: Normal)   Pulse 69   Ht 5' 7" (1.702 m)   Wt 241 lb 6.4 oz (109.5 kg)   SpO2 98%   BMI 37.81 kg/m   GEN: A/Ox3; pleasant , NAD, elderly obese    HEENT:  [place]/AT,  EACs-clear, TMs-wnl, NOSE-clear, THROAT-clear, no lesions, no postnasal drip or exudate noted.   NECK:  Supple w/  fair ROM; no JVD; normal carotid impulses w/o bruits; no thyromegaly or nodules palpated; no lymphadenopathy.    RESP  Clear  P & A; w/o, wheezes/ rales/ or rhonchi. no accessory muscle use, no dullness to percussion  CARD:  RRR, no m/r/g, no peripheral edema, pulses intact, no cyanosis or clubbing.  GI:   Soft &   nt; nml bowel sounds; no organomegaly or masses detected.   Musco: Warm bil, no deformities or joint swelling noted.   Neuro: alert, no focal deficits noted.    Skin: Warm, no lesions or rashes    Lab Results:  CBC    Component Value Date/Time   WBC 11.3 (H) 10/09/2016 0611   RBC 3.60 (L) 10/09/2016 0611   HGB 11.0 (L) 10/09/2016 0611   HCT 34.8 (L) 10/09/2016 0611   PLT 157 10/09/2016 0611   MCV 96.7 10/09/2016 0611   MCH 30.6 10/09/2016 0611   MCHC 31.6 10/09/2016 0611   RDW 13.8 10/09/2016 0611   LYMPHSABS 3.9 02/17/2016 1644   MONOABS 0.7 02/17/2016 1644   EOSABS 0.2 02/17/2016 1644   BASOSABS 0.1 02/17/2016 1644    BMET    Component Value Date/Time   NA 144 10/30/2016 1145   K 4.2 10/30/2016 1145   CL 101 10/30/2016 1145   CO2 27 10/30/2016 1145   GLUCOSE 125 (H) 10/30/2016 1145   GLUCOSE 118 (H) 10/09/2016 0611   BUN 15 10/30/2016 1145   CREATININE 1.34 (H) 10/30/2016 1145   CALCIUM 9.8 10/30/2016 1145   GFRNONAA 40 (L) 10/30/2016 1145   GFRAA 46 (L) 10/30/2016 1145    BNP    Component Value Date/Time   BNP 150.2 (H) 02/17/2016 1644    ProBNP No results found for: PROBNP  Imaging: No results found.   Assessment & Plan:   Upper airway cough syndrome Chronic cough with exacerbation ? Etiology  PFTs are essentially unremarkable except for a mild diffusing defect Chest x-ray shows no acute process. Previous GI and ENT workup is unrevealing. Refractory to cough suppressant GERD and rhinitis prevention Check HRCT chest for possible underlying ILD Check IgE, RAST, ESR Begin aggressive cough suppression regimen with Mucinex, Delsym,  Tessalon next begin aggressive postnasal drip regimen with Allegra chlorphentermine and saline nasal rinses Voice rest  Plan  Patient Instructions  Set up for HRCT chest .  Labs today .  Prednisone taper over next week.  Mucinex Twice daily   Delsym 2 tsp Twice daily  For cough .  Tessalon Three times a day  For cough  Allegra 14m daily in am  Begin Chlorpheniramine 473mAt bedtime   Saline nasal rinses Twice daily  .  Voice rest , sips of water .  NO MINTS .  Follow up with Dr. MaVaughan Brownerr Lossie Kalp NP in 4 weeks and As needed  Please contact office for sooner follow up if symptoms do not improve or worsen or seek emergency care       Rhinitis Trigger control  Labs with IgE /RAST , CBC w/ diff   Plan Patient Instructions  Set up for HRCT chest .  Labs today .  Prednisone taper over next week.  Mucinex Twice daily   Delsym 2 tsp Twice daily  For cough .  Tessalon Three times a day  For cough  Allegra 18062maily in am  Begin Chlorpheniramine 4mg53m bedtime   Saline nasal rinses Twice daily  .  Voice rest , sips of water .  NO MINTS .  Follow up with Dr. MannVaughan BrownerParrett NP in 4 weeks and As needed  Please contact office for sooner follow up if symptoms do not improve or worsen or seek emergency care          TammRexene Edison 03/19/2017

## 2017-03-19 NOTE — Assessment & Plan Note (Signed)
Trigger control  Labs with IgE /RAST , CBC w/ diff   Plan Patient Instructions  Set up for HRCT chest .  Labs today .  Prednisone taper over next week.  Mucinex Twice daily   Delsym 2 tsp Twice daily  For cough .  Tessalon Three times a day  For cough  Allegra 180mg  daily in am  Begin Chlorpheniramine 4mg  At bedtime   Saline nasal rinses Twice daily  .  Voice rest , sips of water .  NO MINTS .  Follow up with Dr. Vaughan Browner or Nazaret Chea NP in 4 weeks and As needed  Please contact office for sooner follow up if symptoms do not improve or worsen or seek emergency care

## 2017-03-19 NOTE — Patient Instructions (Addendum)
Set up for HRCT chest .  Labs today .  Prednisone taper over next week.  Mucinex Twice daily   Delsym 2 tsp Twice daily  For cough .  Tessalon Three times a day  For cough  Allegra 180mg  daily in am  Begin Chlorpheniramine 4mg  At bedtime   Saline nasal rinses Twice daily  .  Voice rest , sips of water .  NO MINTS .  Follow up with Dr. Vaughan Browner or Parrett NP in 4 weeks and As needed  Please contact office for sooner follow up if symptoms do not improve or worsen or seek emergency care

## 2017-03-19 NOTE — Assessment & Plan Note (Signed)
Chronic cough with exacerbation ? Etiology  PFTs are essentially unremarkable except for a mild diffusing defect Chest x-ray shows no acute process. Previous GI and ENT workup is unrevealing. Refractory to cough suppressant GERD and rhinitis prevention Check HRCT chest for possible underlying ILD Check IgE, RAST, ESR Begin aggressive cough suppression regimen with Mucinex, Delsym, Tessalon next begin aggressive postnasal drip regimen with Allegra chlorphentermine and saline nasal rinses Voice rest  Plan  Patient Instructions  Set up for HRCT chest .  Labs today .  Prednisone taper over next week.  Mucinex Twice daily   Delsym 2 tsp Twice daily  For cough .  Tessalon Three times a day  For cough  Allegra 177m daily in am  Begin Chlorpheniramine 422mAt bedtime   Saline nasal rinses Twice daily  .  Voice rest , sips of water .  NO MINTS .  Follow up with Dr. MaVaughan Brownerr Taylyn Brame NP in 4 weeks and As needed  Please contact office for sooner follow up if symptoms do not improve or worsen or seek emergency care

## 2017-03-22 LAB — RESPIRATORY ALLERGY PROFILE REGION II ~~LOC~~
Allergen, Cedar tree, t12: <0.1 kU/L
Allergen, Cottonwood, t14: <0.1 kU/L
Allergen, P. notatum, m1: <0.1 kU/L
CLASS: 0
CLASS: 0
CLASS: 0
CLASS: 0
CLASS: 0
CLASS: 0
CLASS: 0
CLASS: 0
Cat Dander: <0.1 kU/L
Class: 0
Class: 0
Class: 0
Class: 0
Class: 0
Class: 0
Class: 0
Class: 0
Class: 0
Class: 0
Class: 0
Class: 0
Class: 0
Class: 0
Class: 0
Class: 0
Cockroach: <0.1 kU/L
D. farinae: <0.1 kU/L
Dog Dander: <0.1 kU/L
Elm IgE: <0.1 kU/L
IgE (Immunoglobulin E), Serum: 22 kU/L (ref <=114)
Johnson Grass: <0.1 kU/L
Pecan/Hickory Tree IgE: <0.1 kU/L

## 2017-03-22 LAB — INTERPRETATION:

## 2017-03-24 ENCOUNTER — Encounter (HOSPITAL_COMMUNITY): Payer: Self-pay | Admitting: Licensed Clinical Social Worker

## 2017-03-24 ENCOUNTER — Ambulatory Visit (HOSPITAL_COMMUNITY): Payer: Medicare HMO | Admitting: Licensed Clinical Social Worker

## 2017-03-24 DIAGNOSIS — I4891 Unspecified atrial fibrillation: Secondary | ICD-10-CM | POA: Diagnosis not present

## 2017-03-24 DIAGNOSIS — G4733 Obstructive sleep apnea (adult) (pediatric): Secondary | ICD-10-CM | POA: Diagnosis not present

## 2017-03-24 DIAGNOSIS — I129 Hypertensive chronic kidney disease with stage 1 through stage 4 chronic kidney disease, or unspecified chronic kidney disease: Secondary | ICD-10-CM | POA: Diagnosis not present

## 2017-03-24 DIAGNOSIS — F332 Major depressive disorder, recurrent severe without psychotic features: Secondary | ICD-10-CM

## 2017-03-24 DIAGNOSIS — N2581 Secondary hyperparathyroidism of renal origin: Secondary | ICD-10-CM | POA: Diagnosis not present

## 2017-03-24 DIAGNOSIS — M1990 Unspecified osteoarthritis, unspecified site: Secondary | ICD-10-CM | POA: Diagnosis not present

## 2017-03-24 DIAGNOSIS — R69 Illness, unspecified: Secondary | ICD-10-CM | POA: Diagnosis not present

## 2017-03-24 DIAGNOSIS — N183 Chronic kidney disease, stage 3 (moderate): Secondary | ICD-10-CM | POA: Diagnosis not present

## 2017-03-24 DIAGNOSIS — R05 Cough: Secondary | ICD-10-CM | POA: Diagnosis not present

## 2017-03-24 DIAGNOSIS — D631 Anemia in chronic kidney disease: Secondary | ICD-10-CM | POA: Diagnosis not present

## 2017-03-24 DIAGNOSIS — E782 Mixed hyperlipidemia: Secondary | ICD-10-CM | POA: Diagnosis not present

## 2017-03-24 NOTE — Progress Notes (Signed)
   THERAPIST PROGRESS NOTE  Session Time: 12:30pm-1:30pm  Participation Level: Active  Behavioral Response: Well GroomedAlertDepressed  Type of Therapy: Individual Therapy  Treatment Goals addressed: Improve Psychiatric Symptoms, elevate mood (increased self-esteem, increased self-compassion, increased interaction), improve unhelpful thought patterns, controlled behavior, moderate mood, deliberate speech and thought process(improved social functioning, healthy adjustment to living situation), Learn about diagnosis, healthy coping skills  Interventions: Motivational Interviewing, CBT, Grounding & Mindfulness Techniques, psychoeducation  Summary: [name] [name] is a 72 y.o. female who presents with Major Depressive Disorder, recurrent episode, severe  Suicidal/Homicidal: No - without intent/plan  Therapist Response: [name] [name] met with clinician for an individual session.  [name] [name] discussed her psychiatric symptoms, her current life events and her homework. [name] [name] shared that she continues to feel very depressed and overwhelmed with her family life, health, and lack of social life. She reports she has been concerned about her granddaughter, and her daughter gets upset whenever she voices an opinion. [name] [name] processed interactions with children and grandchildren which have been negative. Clinician identified what things she can control and things she cannot. Clinician also noted that her instincts to be helpful for her granddaughter are good, but she needs to be respectful of her daughter's boundaries.  [name] [name] discussed hx of being bullied as a child for her height and hx of not really having friends. She reports she continues to feel anxious with making friends and a lack of motivation to do things she knows she should do.  Clinician referred to [facility]. Clinician also explored other options for making friends and getting involved in the community.  Clinician and Azarie created treatment plan. [name] [name]  reports she does not like her Remeron due to weight gain. She also reports she does not feel the other meds help with depression enough.   Plan: Return again in 1- 2 weeks  Diagnosis:     Axis I: Major Depressive Disorder, recurrent episode, severe                            Mindi Curling, LCSW 03/24/2017

## 2017-03-26 ENCOUNTER — Ambulatory Visit (INDEPENDENT_AMBULATORY_CARE_PROVIDER_SITE_OTHER)
Admission: RE | Admit: 2017-03-26 | Discharge: 2017-03-26 | Disposition: A | Discharge status: Home / Self Care | Payer: Medicare HMO | Source: Ambulatory Visit | Attending: Adult Health | Admitting: Adult Health

## 2017-03-26 DIAGNOSIS — R911 Solitary pulmonary nodule: Secondary | ICD-10-CM | POA: Diagnosis not present

## 2017-03-26 DIAGNOSIS — R05 Cough: Secondary | ICD-10-CM

## 2017-03-26 DIAGNOSIS — R059 Cough, unspecified: Secondary | ICD-10-CM

## 2017-04-05 ENCOUNTER — Telehealth (HOSPITAL_COMMUNITY): Payer: Self-pay

## 2017-04-05 ENCOUNTER — Other Ambulatory Visit (HOSPITAL_COMMUNITY): Payer: Self-pay | Admitting: Psychiatry

## 2017-04-05 NOTE — Telephone Encounter (Signed)
If patient agreed to try a different medication other than Remeron then she can try trazodone 50 mg at bedtime for insomnia.  She will discontinue Remeron and try trazodone 50 mg half to 1 tablet at bedtime.

## 2017-04-05 NOTE — Telephone Encounter (Signed)
Patient is calling for a change in the Mirtazapine, she states it is causing too much weight gain and she would like you to put her on something different. Patient does not return until June. Please review and advise, thank you

## 2017-04-09 MED ORDER — TRAZODONE HCL 50 MG PO TABS: ORAL_TABLET | ORAL | 2 refills | Status: DC

## 2017-04-09 NOTE — Telephone Encounter (Signed)
New prescription called in, patient is aware.

## 2017-04-12 ENCOUNTER — Telehealth (HOSPITAL_COMMUNITY): Payer: Self-pay

## 2017-04-12 ENCOUNTER — Other Ambulatory Visit (HOSPITAL_COMMUNITY): Payer: Self-pay | Admitting: Psychiatry

## 2017-04-12 DIAGNOSIS — R9431 Abnormal electrocardiogram [ECG] [EKG]: Secondary | ICD-10-CM

## 2017-04-12 NOTE — Telephone Encounter (Signed)
We need to get EKG for QTC.  She can try Vistaril 25 mg 1-2 capsule as needed for insomnia.

## 2017-04-12 NOTE — Telephone Encounter (Signed)
Fax received from patients pharmacy, she has a QT prolongation and they want to be sure you were aware before filling the Trazodone.

## 2017-04-15 MED ORDER — HYDROXYZINE HCL 25 MG PO TABS: ORAL_TABLET | ORAL | 0 refills | Status: DC

## 2017-04-16 ENCOUNTER — Encounter: Payer: Self-pay | Admitting: Adult Health

## 2017-04-16 ENCOUNTER — Ambulatory Visit: Payer: Medicare HMO | Admitting: Adult Health

## 2017-04-16 DIAGNOSIS — R05 Cough: Secondary | ICD-10-CM

## 2017-04-16 DIAGNOSIS — J301 Allergic rhinitis due to pollen: Secondary | ICD-10-CM | POA: Diagnosis not present

## 2017-04-16 DIAGNOSIS — I129 Hypertensive chronic kidney disease with stage 1 through stage 4 chronic kidney disease, or unspecified chronic kidney disease: Secondary | ICD-10-CM | POA: Diagnosis not present

## 2017-04-16 DIAGNOSIS — R053 Chronic cough: Secondary | ICD-10-CM

## 2017-04-16 NOTE — Progress Notes (Signed)
$'@Patient'p$  ID: [name] [name], female    DOB: [birth year], 72 y.o.   MRN: [id number]  Chief Complaint  Patient presents with  . Follow-up    Cough     Referring provider: Harlan Stains, MD  HPI: 72 yo female never smoker followed for chronic cough present since 2009 (worse since 2016)  A Fib on Amiodarone (started 02/2017)   Chronic cough hx >recurrent productive cough  Neg CXR  ENT evaluation 2019 neg per pt  GI w/up neg for GERD per pt  Basement but does not use No hot tub use( has one but not functioning )  No chickens, birds, occupatonal /hobby exposure.  No  Autoimmune dz  , no macrodantin use.  PFT October 2018 showed FEV1 at 88%, ratio 80, FVC 83%, no significant bronchodilator response ,mid flow reversibility, DLCO 71%  04/16/2017 Follow up : Chronic Cough  Returns for a one-month follow-up.  Patient has had an chronic cough present since 2009.  She has been treated with multiple regimens over the years with inhalers, steroids, antibiotics without significant improvement.  He was seen last visit with increased cough for 3 months. Labs showed a normal CBC /eosinophils . Nml RAST /IgE . ESR was normal  High resolution CT chest showed no evidence of interstitial lung disease, mild air trapping in both lungs, thin parenchymal bands compatible with postinfectious scarring.  And a small 3 mm right lower lobe nodule. Patient was recommended to use a prednisone taper and a combination of Mucinex, Delsym, Tessalon, Allegra and Chlortab to help with cough and trigger prevention.  She says since last visit she is feeling improved.  She does feel her cough has decreased it is not totally went away but she does feel much improved. We went over her test results in detail.   Allergies  Allergen Reactions  . Lexapro [Escitalopram]     Makes her feel funny  . Peroxide [Hydrogen Peroxide] Other (See Comments)    Redness.   [place] [Sertraline Hcl]     Makes her feel funny  . Neosporin  [Neomycin-Bacitracin Zn-Polymyx] Rash    Blisters, itching.     Immunization History  Administered Date(s) Administered  . Influenza, High Dose Seasonal PF 10/09/2016    Past Medical History:  Diagnosis Date  . Anxiety   . Arthritis   . Cataracts, bilateral    immature  . CKD (chronic kidney disease) stage 3, GFR 30-59 ml/min (HCC)   . Depression   . GERD (gastroesophageal reflux disease)    was on Nexium   . GERD (gastroesophageal reflux disease)   . Hemorrhoid   . History of bronchitis    couple of yrs ago  . History of colon polyps   . History of peristent atrial fibrillation   . Hyperlipidemia    takes Pravastatin daily  . Hypertension    takes Amlodipine and Diovan daily  . Hypertension   . Pre-diabetes   . Sleep apnea    no cpap used  . Sleep apnea    study done 20+yrs ago and doesn't use cpap  . Urinary urgency   . Weakness    in left leg r/t back    Tobacco History: Social History   Tobacco Use  Smoking Status Never Smoker  Smokeless Tobacco Never Used   Counseling given: Not Answered   Outpatient Encounter Medications as of 04/16/2017  Medication Sig  . amiodarone (PACERONE) 200 MG tablet TAKE 1 TABLET BY MOUTH DAILY  . apixaban (  ELIQUIS) 5 MG TABS tablet Take 1 tablet (5 mg total) by mouth 2 (two) times daily.  . Ascorbic Acid (VITAMIN C WITH ROSE HIPS) 1000 MG tablet Take 1,000 mg by mouth daily.  . benzonatate (TESSALON) 200 MG capsule Take 1 capsule (200 mg total) by mouth 3 (three) times daily as needed for cough.  Marland Kitchen BIOTIN PO Take by mouth.  Marland Kitchen buPROPion (WELLBUTRIN XL) 300 MG 24 hr tablet Take 1 tablet (300 mg total) by mouth daily.  Marland Kitchen CALCIUM PO Take 1 tablet by mouth every morning.  . Cholecalciferol (VITAMIN D) 2000 UNITS tablet Take 4,000 Units by mouth daily.  . [employer] MELATONIN PO Take 1 tablet by mouth at bedtime as needed.  . fexofenadine (ALLEGRA) 180 MG tablet Take 180 mg by mouth daily as needed for allergies or rhinitis.  .  furosemide (LASIX) 20 MG tablet TAKE 1 TABLET BY MOUTH ONCE DAILY  . KLOR-CON M20 20 MEQ tablet TAKE 1 TABLET BY MOUTH ONCE DAILY  . metoprolol succinate (TOPROL-XL) 25 MG 24 hr tablet TAKE 1/2 (ONE-HALF) TABLET BY MOUTH ONCE DAILY  . pravastatin (PRAVACHOL) 40 MG tablet Take 40 mg by mouth at bedtime.  . hydrOXYzine (ATARAX/VISTARIL) 25 MG tablet Take 1 - 2 capsules before bed. (Patient not taking: Reported on 04/16/2017)  . predniSONE (DELTASONE) 10 MG tablet 4 tabs for 2 days, then 3 tabs for 2 days, 2 tabs for 2 days, then 1 tab for 2 days, then stop (Patient not taking: Reported on 04/16/2017)  . traZODone (DESYREL) 50 MG tablet Take 1/2 to 1 tablet at bedtime as needed for sleep (Patient not taking: Reported on 04/16/2017)   No facility-administered encounter medications on file as of 04/16/2017.      Review of Systems  Constitutional:   No  weight loss, night sweats,  Fevers, chills, fatigue, or  lassitude.  HEENT:   No headaches,  Difficulty swallowing,  Tooth/dental problems, or  Sore throat,                No sneezing, itching, ear ache,  +nasal congestion, post nasal drip,   CV:  No chest pain,  Orthopnea, PND, swelling in lower extremities, anasarca, dizziness, palpitations, syncope.   GI  No heartburn, indigestion, abdominal pain, nausea, vomiting, diarrhea, change in bowel habits, loss of appetite, bloody stools.   Resp:   No chest wall deformity  Skin: no rash or lesions.  GU: no dysuria, change in color of urine, no urgency or frequency.  No flank pain, no hematuria   MS:  No joint pain or swelling.  No decreased range of motion.  No back pain.    Physical Exam  BP 132/64 (BP Location: Left Arm, Cuff Size: Normal)   Pulse 60   Wt 223 lb 12.8 oz (101.5 kg)   SpO2 98%   BMI 35.05 kg/m   GEN: A/Ox3; pleasant , NAD, obese    HEENT:  [place]/AT,  EACs-clear, TMs-wnl, NOSE-clear drainage , THROAT-clear, no lesions, no postnasal drip or exudate noted.   NECK:  Supple w/  fair ROM; no JVD; normal carotid impulses w/o bruits; no thyromegaly or nodules palpated; no lymphadenopathy.    RESP  Clear  P & A; w/o, wheezes/ rales/ or rhonchi. no accessory muscle use, no dullness to percussion  CARD:  RRR, no m/r/g, no peripheral edema, pulses intact, no cyanosis or clubbing.  GI:   Soft & nt; nml bowel sounds; no organomegaly or masses detected.   Musco: Warm  bil, no deformities or joint swelling noted.   Neuro: alert, no focal deficits noted.    Skin: Warm, no lesions or rashes    Lab Results:  CBC   No results found for: PROBNP  Imaging: Ct Chest High Resolution  Result Date: 03/26/2017 CLINICAL DATA:  Chronic cough.  Nonsmoker. EXAM: CT CHEST WITHOUT CONTRAST TECHNIQUE: Multidetector CT imaging of the chest was performed following the standard protocol without intravenous contrast. High resolution imaging of the lungs, as well as inspiratory and expiratory imaging, was performed. COMPARISON:  11/09/2016 chest radiograph. FINDINGS: Cardiovascular: Normal heart size. No significant pericardial fluid/thickening. Left anterior descending and left circumflex coronary atherosclerosis. Mildly atherosclerotic nonaneurysmal thoracic aorta. Normal caliber pulmonary arteries. Mediastinum/Nodes: No discrete thyroid nodules. Unremarkable esophagus. No pathologically enlarged axillary, mediastinal or gross hilar lymph nodes, noting limited sensitivity for the detection of hilar adenopathy on this noncontrast study. Lungs/Pleura: No pneumothorax. No pleural effusion. No acute consolidative airspace disease or lung masses. Subpleural solid 3 mm anterior right lower lobe pulmonary nodule (series 3/image 66). No additional significant pulmonary nodules. Mild patchy air trapping in both lungs on the expiration sequence. No significant regions of subpleural reticulation, ground-glass attenuation, traction bronchiectasis, architectural distortion or frank honeycombing. A few thin  scattered parenchymal bands in the mid to lower lungs bilaterally. Upper abdomen: Postsurgical changes are noted from Nissen fundoplication. Musculoskeletal: No aggressive appearing focal osseous lesions. Mild thoracic spondylosis. IMPRESSION: 1. No evidence of interstitial lung disease. 2. Mild patchy air trapping in both lungs, indicative of small airways disease. 3. Thin parenchymal bands in the mid to lower lungs, compatible with mild postinfectious/postinflammatory scarring. 4. Solitary subpleural 3 mm solid right lower lobe pulmonary nodule, probably benign. No follow-up needed if patient is low-risk. Non-contrast chest CT can be considered in 12 months if patient is high-risk. This recommendation follows the consensus statement: Guidelines for Management of Incidental Pulmonary Nodules Detected on CT Images:From the Fleischner Society 2017; published online before print (10.1148/radiol.2841324401). 5. Two vessel coronary atherosclerosis. Aortic Atherosclerosis (ICD10-I70.0). Electronically Signed   By: Ilona Sorrel M.D.   On: 03/26/2017 22:26     Assessment & Plan:   Chronic cough Improved with regimen aimed at treatment of cough and trigger prevention Workup was unrevealing.. CT chest showed no evidence of ILD.  No evidence of amiodarone toxicity.  Plan  Patient Instructions  Mucinex Twice daily  For cough As needed   Delsym 2 tsp Twice daily  For cough As needed   Tessalon Three times a day  For cough As needed   Allegra '180mg'$  daily in am for drainage As needed   Chlorpheniramine '4mg'$  At bedtime  For drainage As needed   Saline nasal rinses Twice daily  As needed   Voice rest , sips of water .  NO MINTS .  Follow up in 2-3 months and As needed   Please contact office for sooner follow up if symptoms do not improve or worsen or seek emergency care       Rhinitis, allergic Improved control   Plan  Patient Instructions  Mucinex Twice daily  For cough As needed   Delsym 2 tsp  Twice daily  For cough As needed   Tessalon Three times a day  For cough As needed   Allegra '180mg'$  daily in am for drainage As needed   Chlorpheniramine '4mg'$  At bedtime  For drainage As needed   Saline nasal rinses Twice daily  As needed   Voice rest , sips of water .  NO MINTS .  Follow up in 2-3 months and As needed   Please contact office for sooner follow up if symptoms do not improve or worsen or seek emergency care          Rexene Edison, NP 04/16/2017

## 2017-04-16 NOTE — Assessment & Plan Note (Addendum)
Improved with regimen aimed at treatment of cough and trigger prevention Workup was unrevealing.. CT chest showed no evidence of ILD.  No evidence of amiodarone toxicity.  Plan  Patient Instructions  Mucinex Twice daily  For cough As needed   Delsym 2 tsp Twice daily  For cough As needed   Tessalon Three times a day  For cough As needed   Allegra 180mg  daily in am for drainage As needed   Chlorpheniramine 4mg  At bedtime  For drainage As needed   Saline nasal rinses Twice daily  As needed   Voice rest , sips of water .  NO MINTS .  Follow up in 2-3 months and As needed   Please contact office for sooner follow up if symptoms do not improve or worsen or seek emergency care

## 2017-04-16 NOTE — Patient Instructions (Addendum)
Mucinex Twice daily  For cough As needed   Delsym 2 tsp Twice daily  For cough As needed   Tessalon Three times a day  For cough As needed   Allegra 180mg  daily in am for drainage As needed   Chlorpheniramine 4mg  At bedtime  For drainage As needed   Saline nasal rinses Twice daily  As needed   Voice rest , sips of water .  NO MINTS .  Follow up in 2-3 months and As needed   Please contact office for sooner follow up if symptoms do not improve or worsen or seek emergency care

## 2017-04-16 NOTE — Assessment & Plan Note (Signed)
Improved control   Plan  Patient Instructions  Mucinex Twice daily  For cough As needed   Delsym 2 tsp Twice daily  For cough As needed   Tessalon Three times a day  For cough As needed   Allegra 180mg  daily in am for drainage As needed   Chlorpheniramine 4mg  At bedtime  For drainage As needed   Saline nasal rinses Twice daily  As needed   Voice rest , sips of water .  NO MINTS .  Follow up in 2-3 months and As needed   Please contact office for sooner follow up if symptoms do not improve or worsen or seek emergency care

## 2017-04-21 ENCOUNTER — Ambulatory Visit (HOSPITAL_COMMUNITY): Payer: Medicare HMO | Admitting: Licensed Clinical Social Worker

## 2017-04-22 ENCOUNTER — Other Ambulatory Visit (HOSPITAL_COMMUNITY): Payer: Self-pay

## 2017-04-29 ENCOUNTER — Ambulatory Visit: Payer: Medicare HMO | Admitting: Internal Medicine

## 2017-04-29 ENCOUNTER — Encounter: Payer: Self-pay | Admitting: Internal Medicine

## 2017-04-29 VITALS — BP 154/88 | HR 64 | Ht 67.0 in | Wt 242.0 lb

## 2017-04-29 DIAGNOSIS — I481 Persistent atrial fibrillation: Secondary | ICD-10-CM | POA: Diagnosis not present

## 2017-04-29 DIAGNOSIS — I4819 Other persistent atrial fibrillation: Secondary | ICD-10-CM

## 2017-04-29 DIAGNOSIS — I10 Essential (primary) hypertension: Secondary | ICD-10-CM

## 2017-04-29 DIAGNOSIS — R05 Cough: Secondary | ICD-10-CM | POA: Diagnosis not present

## 2017-04-29 DIAGNOSIS — R059 Cough, unspecified: Secondary | ICD-10-CM

## 2017-04-29 NOTE — Progress Notes (Signed)
Follow-up Outpatient Visit Date: 04/29/2017  Primary Care Provider: Harlan Stains, MD [place] [id number]  Chief Complaint: Follow-up atrial fibrillation  HPI:  [name] [name] is a 72 y.o. year-old female with history of persistent atrial fibrillation, CKD stage III, HTN, HLD, OSA, and borderline diabetes, who presents for follow-up of atrial fibrillation.  I last saw [name] [name] in October, at which time she was doing well with improved shortness of breath.  Her only complaint was of continued left knee pain following arthroplasty 1 month earlier.  She also made note of pruritus when she feels hot and wondered if this could be related to 1 of her cardiac medications.  She contacted our office in February out of concern for hair loss that she thought could be due to amiodarone or metoprolol.  She was seen by pulmonology last month due to worsening cough.  She was prescribed a prednisone taper as well as antihistamines and cough suppressants.  Today, [name] [name] reports feeling well.  She still has a bit of a cough, though it has improved compared with last month.  She denies chest pain, shortness of breath, palpitations, lightheadedness, and edema.  She was seen by Dr. Jimmy Footman (nephrology) earlier this week and was advised to increase furosemide from 20 mg daily to 40 mg daily.  Home blood pressure readings have been normal at times but also elevated with frequent systolic pressures in the 150s.  [name] [name] remains compliant with her medications and is tolerating apixaban, amiodarone, and metoprolol well.  She was seen by a dermatologist regarding her alopecia and was told that it may be related to her knee arthroplasty last year.  She has not noticed any significant change in her hair loss.  --------------------------------------------------------------------------------------------------  Cardiovascular History & Procedures: Cardiovascular Problems:  Persistent  atrial fibrillation  Risk Factors:  Hypertension, hyperlipidemia, and age greater than 86  Cath/PCI:  None  CV Surgery:  None  EP Procedures and Devices:  None  Non-Invasive Evaluation(s):  Pharmacologic MPI (07/07/16): Low risk study without ischemia or scar.  LVEF 64%.  TEE (02/24/16): LVEF depressed but difficult to assess. Trace AI. Moderate MR. Large LA without thrombus. Question small PFO. Normal RV size with depressed function. Mild TR.  TTE (02/18/16): Normal LV size and wall thickness with EF 50-55%. Normal wall motion. MAC with mild MR. Moderate LA enlargement. Mildly dilated RV with normal contraction. Mild TR. Mildly elevated PA pressure.  Stress test (15-20 years ago): Normal per patient's report.  Recent CV Pertinent Labs: Lab Results  Component Value Date   CHOL 125 02/18/2016   HDL 41 02/18/2016   LDLCALC 66 02/18/2016   TRIG 90 02/18/2016   CHOLHDL 3.0 02/18/2016   INR 0.91 10/07/2016   BNP 150.2 (H) 02/17/2016   K 4.2 10/30/2016   MG 1.7 02/17/2016   BUN 15 10/30/2016   CREATININE 1.34 (H) 10/30/2016    Past medical and surgical history were reviewed and updated in EPIC.  Current Meds  Medication Sig  . amiodarone (PACERONE) 200 MG tablet TAKE 1 TABLET BY MOUTH DAILY  . apixaban (ELIQUIS) 5 MG TABS tablet Take 1 tablet (5 mg total) by mouth 2 (two) times daily.  . Ascorbic Acid (VITAMIN C WITH ROSE HIPS) 1000 MG tablet Take 1,000 mg by mouth daily.  . benzonatate (TESSALON) 200 MG capsule Take 1 capsule (200 mg total) by mouth 3 (three) times daily as needed for cough.  Marland Kitchen BIOTIN PO Take by  mouth.  . buPROPion (WELLBUTRIN XL) 300 MG 24 hr tablet Take 1 tablet (300 mg total) by mouth daily.  Marland Kitchen CALCIUM PO Take 1 tablet by mouth every morning.  . Cholecalciferol (VITAMIN D) 2000 UNITS tablet Take 4,000 Units by mouth daily.  . CVS MELATONIN PO Take 1 tablet by mouth at bedtime as needed.  . fexofenadine (ALLEGRA) 180 MG tablet Take 180 mg by  mouth daily as needed for allergies or rhinitis.  . furosemide (LASIX) 20 MG tablet TAKE 1 TABLET BY MOUTH ONCE DAILY  . KLOR-CON M20 20 MEQ tablet TAKE 1 TABLET BY MOUTH ONCE DAILY  . metoprolol succinate (TOPROL-XL) 25 MG 24 hr tablet TAKE 1/2 (ONE-HALF) TABLET BY MOUTH ONCE DAILY  . pravastatin (PRAVACHOL) 40 MG tablet Take 40 mg by mouth at bedtime.    Allergies: Lexapro [escitalopram]; Peroxide [hydrogen peroxide]; Zoloft [sertraline hcl]; and Neosporin [neomycin-bacitracin zn-polymyx]  Social History   Socioeconomic History  . Marital status: Married    Spouse name: [name] [name]  . Number of children: 2  . Years of education: 45  . Highest education level: Some college, no degree  Occupational History  . Not on file  Social Needs  . Financial resource strain: Not very hard  . Food insecurity:    Worry: Patient refused    Inability: Patient refused  . Transportation needs:    Medical: Patient refused    Non-medical: Patient refused  Tobacco Use  . Smoking status: Never Smoker  . Smokeless tobacco: Never Used  Substance and Sexual Activity  . Alcohol use: No  . Drug use: No  . Sexual activity: Yes    Birth control/protection: Surgical  Lifestyle  . Physical activity:    Days per week: Patient refused    Minutes per session: Patient refused  . Stress: Very much  Relationships  . Social connections:    Talks on phone: Twice a week    Gets together: Twice a week    Attends religious service: More than 4 times per year    Active member of club or organization: No    Attends meetings of clubs or organizations: Never    Relationship status: Married  . Intimate partner violence:    Fear of current or ex partner: No    Emotionally abused: No    Physically abused: No    Forced sexual activity: No  Other Topics Concern  . Not on file  Social History Narrative   ** Merged History Encounter **        Family History  Problem Relation Age of Onset  . Colon cancer Mother     . Heart disease Father     Review of Systems: Review of Systems  Constitutional: Negative for weight loss (wt gain due to anxiety medication).  HENT: Negative.   Eyes: Negative.   Respiratory: Positive for cough and shortness of breath (with activity - stable).   Cardiovascular: Negative.   Gastrointestinal: Negative.   Genitourinary: Negative.   Musculoskeletal: Negative.  Negative for joint pain (persistent left knee swelling).  Neurological: Negative.   Endo/Heme/Allergies: Negative.   Psychiatric/Behavioral: Positive for depression. The patient is nervous/anxious.     --------------------------------------------------------------------------------------------------  Physical Exam: BP (!) 154/88   Pulse 64   Ht _0  (1.702 m)   Wt 242 lb (109.8 kg)   BMI 37.90 kg/m   General:  NAD. HEENT: No conjunctival pallor or scleral icterus. Moist mucous membranes.  OP clear. Neck: Supple without lymphadenopathy, thyromegaly, JVD, or  HJR. Lungs: Normal work of breathing. Clear to auscultation bilaterally without wheezes or crackles. Heart: Regular rate and rhythm without murmurs, rubs, or gallops. Non-displaced PMI. Abd: Bowel sounds present. Soft, NT/ND without hepatosplenomegaly Ext: Trace pretibial edema bilaterally.Skin: Warm and dry without rash.  EKG:  NSR with low voltage and non-specific ST/T changes.  No significant change.  Lab Results  Component Value Date   WBC 5.6 03/19/2017   HGB 13.3 03/19/2017   HCT 39.8 03/19/2017   MCV 93.7 03/19/2017   PLT 188.0 03/19/2017    Lab Results  Component Value Date   NA 144 10/30/2016   K 4.2 10/30/2016   CL 101 10/30/2016   CO2 27 10/30/2016   BUN 15 10/30/2016   CREATININE 1.34 (H) 10/30/2016   GLUCOSE 125 (H) 10/30/2016   ALT 12 10/30/2016    Lab Results  Component Value Date   CHOL 125 02/18/2016   HDL 41 02/18/2016   LDLCALC 66 02/18/2016   TRIG 90 02/18/2016   CHOLHDL 3.0 02/18/2016   ALT (03/24/17):  39 TSH (03/03/17): 3.73  --------------------------------------------------------------------------------------------------  ASSESSMENT AND PLAN: Persistent atrial fibrillation [name] [name] is maintaining NSR and tolerating anticoagulation with apixaban well.  We discussed decreasing amiodarone to 100 mg daily or stopping altogether (+/- switching to an alternative antiarrhythmic agent) given her chronic cough.  As the cough is improving, [name] [name] would like to continue her current regimen.  Continue follow-up of TFT' and LFT's.  Cough Likely multifactorial and improving after recent pulmonary visit.  Continue current medications and defer further management to pulmonary.  PFT's will need to be repeated in the next 3-6 months, given ongoing amiodarone use.  Hypertension BP mildly elevated today.  Furosemide recently increased by Dr. Jimmy Footman, which may help blood pressure a little bit.  I will defer further medication changes to Drs. Deterding and White.  Follow-up: Return to clinic in 6 months.  Nelva Bush, MD 04/30/2017 1:49 PM

## 2017-04-29 NOTE — Patient Instructions (Addendum)
Medication Instructions:  Your physician recommends that you continue on your current medications as directed. Please refer to the Current Medication list given to you today.  -- If you need a refill on your cardiac medications before your next appointment, please call your pharmacy. --  Labwork: None ordered  Testing/Procedures: None ordered  Follow-Up: Your physician wants you to follow-up in: 6 MONTHS with Dr. Saunders Revel.    You will receive a reminder letter in the mail two months in advance. If you don't receive a letter, please call our office to schedule the follow-up appointment.  Thank you for choosing [facility]!!    Any Other Special Instructions Will Be Listed Below (If Applicable).

## 2017-04-30 ENCOUNTER — Encounter: Payer: Self-pay | Admitting: Internal Medicine

## 2017-04-30 DIAGNOSIS — H52203 Unspecified astigmatism, bilateral: Secondary | ICD-10-CM | POA: Diagnosis not present

## 2017-04-30 DIAGNOSIS — H524 Presbyopia: Secondary | ICD-10-CM | POA: Diagnosis not present

## 2017-04-30 NOTE — Addendum Note (Signed)
Addended by: Velna Ochs on: 04/30/2017 04:40 PM   Modules accepted: Orders

## 2017-05-12 DIAGNOSIS — R490 Dysphonia: Secondary | ICD-10-CM | POA: Insufficient documentation

## 2017-05-12 DIAGNOSIS — N189 Chronic kidney disease, unspecified: Secondary | ICD-10-CM | POA: Diagnosis not present

## 2017-05-12 DIAGNOSIS — K219 Gastro-esophageal reflux disease without esophagitis: Secondary | ICD-10-CM | POA: Diagnosis not present

## 2017-05-12 HISTORY — DX: Dysphonia: R49.0

## 2017-05-24 DIAGNOSIS — K5900 Constipation, unspecified: Secondary | ICD-10-CM | POA: Diagnosis not present

## 2017-05-24 DIAGNOSIS — R05 Cough: Secondary | ICD-10-CM | POA: Diagnosis not present

## 2017-06-08 DIAGNOSIS — L989 Disorder of the skin and subcutaneous tissue, unspecified: Secondary | ICD-10-CM | POA: Diagnosis not present

## 2017-06-08 DIAGNOSIS — N183 Chronic kidney disease, stage 3 (moderate): Secondary | ICD-10-CM | POA: Diagnosis not present

## 2017-06-08 DIAGNOSIS — R69 Illness, unspecified: Secondary | ICD-10-CM | POA: Diagnosis not present

## 2017-06-08 DIAGNOSIS — R7303 Prediabetes: Secondary | ICD-10-CM | POA: Diagnosis not present

## 2017-06-08 DIAGNOSIS — E785 Hyperlipidemia, unspecified: Secondary | ICD-10-CM | POA: Diagnosis not present

## 2017-06-08 DIAGNOSIS — I129 Hypertensive chronic kidney disease with stage 1 through stage 4 chronic kidney disease, or unspecified chronic kidney disease: Secondary | ICD-10-CM | POA: Diagnosis not present

## 2017-06-09 DIAGNOSIS — I889 Nonspecific lymphadenitis, unspecified: Secondary | ICD-10-CM | POA: Diagnosis not present

## 2017-06-14 ENCOUNTER — Ambulatory Visit (HOSPITAL_COMMUNITY): Payer: Self-pay | Admitting: Psychiatry

## 2017-06-16 ENCOUNTER — Encounter: Payer: Self-pay | Admitting: Adult Health

## 2017-06-16 ENCOUNTER — Ambulatory Visit: Payer: Medicare HMO | Admitting: Adult Health

## 2017-06-16 DIAGNOSIS — R05 Cough: Secondary | ICD-10-CM | POA: Diagnosis not present

## 2017-06-16 DIAGNOSIS — J310 Chronic rhinitis: Secondary | ICD-10-CM

## 2017-06-16 DIAGNOSIS — R053 Chronic cough: Secondary | ICD-10-CM

## 2017-06-16 HISTORY — DX: Chronic rhinitis: J31.0

## 2017-06-16 NOTE — Assessment & Plan Note (Signed)
Upper airway cough with extensive work-up.  No evidence of ILD.  No evidence of COPD or asthma. Appears  to be improving with aggressive cough regimen and prevention of postnasal drainage. Continue current regimen.. Change Mucinex to as needed  Plan  Patient Instructions  Change Mucinex Twice daily As needed  Only if you get thick mucus /congestion .  Continue on Delsym 2 tsp Twice daily  For cough As needed   Continue on Tessalon Three times a day  For cough As needed   Continue on Allegra 180mg  daily in am .  May use Chlorpheniramine 4mg  1-2 At bedtime  As needed.  May use Chlorpheniramine 4mg  in am As needed  Drainage.  Saline nasal rinses Twice daily  Voice rest , sips of water .  NO MINTS .  Follow up in 4 months and As needed   Please contact office for sooner follow up if symptoms do not improve or worsen or seek emergency care   Check with PCP if you have had the Pneumovax and Prevnar 13 vaccines.

## 2017-06-16 NOTE — Progress Notes (Signed)
@Patient  ID: [name] [name], female    DOB: 16-Dec-1945, 72 y.o.   MRN: [id number]  Chief Complaint  Patient presents with  . Follow-up    Cough     Referring provider: Harlan Stains, MD  HPI: 72 yo female never smokerfollowed for chronic cough present since 2009(worse since 2016)  A Fib on Amiodarone (started 02/2017)   Chronic cough hx>recurrent productive cough  Neg CXR  ENT evaluation 2019 neg per pt  GI w/up neg for GERD per pt  Basement but does not use No hot tub use( has one but not functioning )  No chickens, birds, occupatonal /hobby exposure.  No  Autoimmune dz  , no macrodantin use. On amiodarone 02/2017  PFT October 2018 showed FEV1 at 88%, ratio 80, FVC 83%, no significant bronchodilator response,mid flow reversibility, DLCO 71% CT chest March 26, 2017> negative for ILD, patchy air trapping in both lungs, thin parenchymal bands in the mid to lower lungs compatible with a mild postinfectious or postinflammatory scarring, 3 mm right lower lobe nodule. ESR March 2019 normal March 2019 allergy profile and IgE and CBC with differential normal  06/16/2017 Follow up : Cough  Patient presents for a 70-monthfollow-up.  Patient is followed for chronic cough is been present since 2009.  She has been treated with multiple regimens over the years with inhalers, steroids, antibiotics without significant improvement. He is on amiodarone that was started in February 2019.  Lab work has showed a normal CBC with eosinophils.  Allergy profile has been normal.  IgE was normal.  His ESR was normal.  A high-resolution CT chest showed no evidence of interstitial lung disease. She had been having trouble over the last 3 to 4 months of increased cough.  Last visit patient was placed on a regimen of Mucinex, Delsym, Tessalon and Allegra and chlor tabs to help with cough and trigger prevention.  Says cough is still doing okay , cough has not totally went away  Feels cough is doing much better.  Does have a lot of  Taking Delsym Twice daily  , and mucinex Twice daily  , Tessalon Twice daily  , chlortab Twice daily  . and allegra daily. Saline nasal rinses Twice daily  .         Allergies  Allergen Reactions  . Lexapro [Escitalopram]     Makes her feel funny  . Peroxide [Hydrogen Peroxide] Other (See Comments)    Redness.   .[place][Sertraline Hcl]     Makes her feel funny  . Neosporin [Neomycin-Bacitracin Zn-Polymyx] Rash    Blisters, itching.     Immunization History  Administered Date(s) Administered  . Influenza, High Dose Seasonal PF 10/09/2016    Past Medical History:  Diagnosis Date  . Anxiety   . Arthritis   . Cataracts, bilateral    immature  . CKD (chronic kidney disease) stage 3, GFR 30-59 ml/min (HCC)   . Depression   . GERD (gastroesophageal reflux disease)    was on Nexium   . GERD (gastroesophageal reflux disease)   . Hemorrhoid   . History of bronchitis    couple of yrs ago  . History of colon polyps   . History of peristent atrial fibrillation   . Hyperlipidemia    takes Pravastatin daily  . Hypertension    takes Amlodipine and Diovan daily  . Hypertension   . Pre-diabetes   . Sleep apnea    no cpap used  . Sleep apnea  study done 20+yrs ago and doesn't use cpap  . Urinary urgency   . Weakness    in left leg r/t back    Tobacco History: Social History   Tobacco Use  Smoking Status Never Smoker  Smokeless Tobacco Never Used   Counseling given: Not Answered   Outpatient Encounter Medications as of 06/16/2017  Medication Sig  . amiodarone (PACERONE) 200 MG tablet TAKE 1 TABLET BY MOUTH DAILY  . apixaban (ELIQUIS) 5 MG TABS tablet Take 1 tablet (5 mg total) by mouth 2 (two) times daily.  . Ascorbic Acid (VITAMIN C WITH ROSE HIPS) 1000 MG tablet Take 1,000 mg by mouth daily.  . benzonatate (TESSALON) 200 MG capsule Take 1 capsule (200 mg total) by mouth 3 (three) times daily as needed for cough.  Marland Kitchen BIOTIN PO Take by mouth.    Marland Kitchen buPROPion (WELLBUTRIN XL) 300 MG 24 hr tablet Take 1 tablet (300 mg total) by mouth daily.  Marland Kitchen CALCIUM PO Take 1 tablet by mouth every morning.  . Cholecalciferol (VITAMIN D) 2000 UNITS tablet Take 4,000 Units by mouth daily.  . CVS MELATONIN PO Take 1 tablet by mouth at bedtime as needed.  Marland Kitchen dextromethorphan (DELSYM) 30 MG/5ML liquid Take 5 mLs by mouth daily.  . fexofenadine (ALLEGRA) 180 MG tablet Take 180 mg by mouth daily as needed for allergies or rhinitis.  . furosemide (LASIX) 20 MG tablet TAKE 1 TABLET BY MOUTH ONCE DAILY  . guaiFENesin (MUCINEX) 600 MG 12 hr tablet Take 600 mg by mouth 2 (two) times daily.  Marland Kitchen KLOR-CON M20 20 MEQ tablet TAKE 1 TABLET BY MOUTH ONCE DAILY  . metoprolol succinate (TOPROL-XL) 25 MG 24 hr tablet TAKE 1/2 (ONE-HALF) TABLET BY MOUTH ONCE DAILY  . pravastatin (PRAVACHOL) 40 MG tablet Take 40 mg by mouth at bedtime.  . [DISCONTINUED] hydrOXYzine (ATARAX/VISTARIL) 25 MG tablet Take 1 - 2 capsules before bed.  . [DISCONTINUED] predniSONE (DELTASONE) 10 MG tablet 4 tabs for 2 days, then 3 tabs for 2 days, 2 tabs for 2 days, then 1 tab for 2 days, then stop  . [DISCONTINUED] traZODone (DESYREL) 50 MG tablet Take 1/2 to 1 tablet at bedtime as needed for sleep   No facility-administered encounter medications on file as of 06/16/2017.      Review of Systems  Constitutional:   No  weight loss, night sweats,  Fevers, chills, fatigue, or  lassitude.  HEENT:   No headaches,  Difficulty swallowing,  Tooth/dental problems, or  Sore throat,                No sneezing, itching, ear ache, +nasal congestion, post nasal drip,   CV:  No chest pain,  Orthopnea, PND, swelling in lower extremities, anasarca, dizziness, palpitations, syncope.   GI  No heartburn, indigestion, abdominal pain, nausea, vomiting, diarrhea, change in bowel habits, loss of appetite, bloody stools.   Resp:  .  No chest wall deformity  Skin: no rash or lesions.  GU: no dysuria, change in color  of urine, no urgency or frequency.  No flank pain, no hematuria   MS:  No joint pain or swelling.  No decreased range of motion.  No back pain.    Physical Exam  BP 136/80 (BP Location: Left Arm, Cuff Size: Normal)   Pulse 62   Ht 5' 7"  (1.702 m)   Wt 238 lb (108 kg)   SpO2 97%   BMI 37.28 kg/m   GEN: A/Ox3; pleasant , NAD, elderly  HEENT:  [place]/AT,  EACs-clear, TMs-wnl, NOSE-clear, THROAT-clear, no lesions, no postnasal drip or exudate noted.   NECK:  Supple w/ fair ROM; no JVD; normal carotid impulses w/o bruits; no thyromegaly or nodules palpated; no lymphadenopathy.    RESP  Clear  P & A; w/o, wheezes/ rales/ or rhonchi. no accessory muscle use, no dullness to percussion  CARD:  RRR, no m/r/g, tr  peripheral edema, pulses intact, no cyanosis or clubbing.  GI:   Soft & nt; nml bowel sounds; no organomegaly or masses detected.   Musco: Warm bil, no deformities or joint swelling noted.   Neuro: alert, no focal deficits noted.    Skin: Warm, no lesions or rashes    Lab Results:  CBC   BNP   ProBNP No results found for: PROBNP  Imaging: No results found.   Assessment & Plan:   Chronic cough Upper airway cough with extensive work-up.  No evidence of ILD.  No evidence of COPD or asthma. Appears  to be improving with aggressive cough regimen and prevention of postnasal drainage. Continue current regimen.. Change Mucinex to as needed  Plan  Patient Instructions  Change Mucinex Twice daily As needed  Only if you get thick mucus /congestion .  Continue on Delsym 2 tsp Twice daily  For cough As needed   Continue on Tessalon Three times a day  For cough As needed   Continue on Allegra 171m daily in am .  May use Chlorpheniramine 419m1-2 At bedtime  As needed.  May use Chlorpheniramine 36m736mn am As needed  Drainage.  Saline nasal rinses Twice daily  Voice rest , sips of water .  NO MINTS .  Follow up in 4 months and As needed   Please contact office for  sooner follow up if symptoms do not improve or worsen or seek emergency care   Check with PCP if you have had the Pneumovax and Prevnar 13 vaccines.        Chronic rhinitis Cont on aggressive prevention regimen   Plan  Patient Instructions  Change Mucinex Twice daily As needed  Only if you get thick mucus /congestion .  Continue on Delsym 2 tsp Twice daily  For cough As needed   Continue on Tessalon Three times a day  For cough As needed   Continue on Allegra 180m15mily in am .  May use Chlorpheniramine 36mg 436m At bedtime  As needed.  May use Chlorpheniramine 36mg i10mm As needed  Drainage.  Saline nasal rinses Twice daily  Voice rest , sips of water .  NO MINTS .  Follow up in 4 months and As needed   Please contact office for sooner follow up if symptoms do not improve or worsen or seek emergency care   Check with PCP if you have had the Pneumovax and Prevnar 13 vaccines.           Porfiria Heinrich Rexene Edison/05/2017

## 2017-06-16 NOTE — Assessment & Plan Note (Signed)
Cont on aggressive prevention regimen   Plan  Patient Instructions  Change Mucinex Twice daily As needed  Only if you get thick mucus /congestion .  Continue on Delsym 2 tsp Twice daily  For cough As needed   Continue on Tessalon Three times a day  For cough As needed   Continue on Allegra 180mg  daily in am .  May use Chlorpheniramine 4mg  1-2 At bedtime  As needed.  May use Chlorpheniramine 4mg  in am As needed  Drainage.  Saline nasal rinses Twice daily  Voice rest , sips of water .  NO MINTS .  Follow up in 4 months and As needed   Please contact office for sooner follow up if symptoms do not improve or worsen or seek emergency care   Check with PCP if you have had the Pneumovax and Prevnar 13 vaccines.

## 2017-06-16 NOTE — Patient Instructions (Addendum)
Change Mucinex Twice daily As needed  Only if you get thick mucus /congestion .  Continue on Delsym 2 tsp Twice daily  For cough As needed   Continue on Tessalon Three times a day  For cough As needed   Continue on Allegra 180mg  daily in am .  May use Chlorpheniramine 4mg  1-2 At bedtime  As needed.  May use Chlorpheniramine 4mg  in am As needed  Drainage.  Saline nasal rinses Twice daily  Voice rest , sips of water .  NO MINTS .  Follow up in 4 months and As needed   Please contact office for sooner follow up if symptoms do not improve or worsen or seek emergency care   Check with PCP if you have had the Pneumovax and Prevnar 13 vaccines.

## 2017-06-29 ENCOUNTER — Ambulatory Visit: Payer: Medicare HMO | Admitting: Pulmonary Disease

## 2017-06-29 NOTE — Progress Notes (Unsigned)
$'@Patient'U$  ID: [name] [name], female    DOB: 1945/09/07, 72 y.o.   MRN: [id number]  No chief complaint on file.   Referring provider: Harlan Stains, MD  HPI: 72 yo female never smokerfollowed for chronic cough present since 2009(worse since 2016)  A Fib on Amiodarone (started 02/2017)   Recent [facility] Encounters:  06/16/2017 Follow up : Cough - TP Patient presents for a 29-monthfollow-up.  Patient is followed for chronic cough is been present since 2009.  She has been treated with multiple regimens over the years with inhalers, steroids, antibiotics without significant improvement. He is on amiodarone that was started in February 2019.  Lab work has showed a normal CBC with eosinophils.  Allergy profile has been normal.  IgE was normal.  His ESR was normal.  A high-resolution CT chest showed no evidence of interstitial lung disease. She had been having trouble over the last 3 to 4 months of increased cough.  Last visit patient was placed on a regimen of Mucinex, Delsym, Tessalon and Allegra and chlor tabs to help with cough and trigger prevention.  Says cough is still doing okay , cough has not totally went away  Feels cough is doing much better. Does have a lot of  Taking Delsym Twice daily  , and mucinex Twice daily  , Tessalon Twice daily  , chlortab Twice daily  . and allegra daily. Saline nasal rinses Twice daily  .    Tests:   Chronic cough hx>recurrent productive cough  Neg CXR  ENT evaluation 2019 neg per pt  GI w/up neg for GERD per pt  Basement but does not use No hot tub use( has one but not functioning )  No chickens, birds, occupatonal /hobby exposure.  No  Autoimmune dz  , no macrodantin use. On amiodarone 02/2017  PFT October 2018 showed FEV1 at 88%, ratio 80, FVC 83%, no significant bronchodilator response,mid flow reversibility, DLCO 71% CT chest March 26, 2017> negative for ILD, patchy air trapping in both lungs, thin parenchymal bands in the mid to lower  lungs compatible with a mild postinfectious or postinflammatory scarring, 3 mm right lower lobe nodule. ESR March 2019 normal March 2019 allergy profile and IgE and CBC with differential normal   Imaging:   Cardiac:   Labs:   Micro:   Chart Review:       Allergies  Allergen Reactions  . Lexapro [Escitalopram]     Makes her feel funny  . Peroxide [Hydrogen Peroxide] Other (See Comments)    Redness.   .Dot Lanes[Sertraline Hcl]     Makes her feel funny  . Neosporin [Neomycin-Bacitracin Zn-Polymyx] Rash    Blisters, itching.     Immunization History  Administered Date(s) Administered  . Influenza, High Dose Seasonal PF 10/09/2016    Past Medical History:  Diagnosis Date  . Anxiety   . Arthritis   . Cataracts, bilateral    immature  . CKD (chronic kidney disease) stage 3, GFR 30-59 ml/min (HCC)   . Depression   . GERD (gastroesophageal reflux disease)    was on Nexium   . GERD (gastroesophageal reflux disease)   . Hemorrhoid   . History of bronchitis    couple of yrs ago  . History of colon polyps   . History of peristent atrial fibrillation   . Hyperlipidemia    takes Pravastatin daily  . Hypertension    takes Amlodipine and Diovan daily  . Hypertension   . Pre-diabetes   .  Sleep apnea    no cpap used  . Sleep apnea    study done 20+yrs ago and doesn't use cpap  . Urinary urgency   . Weakness    in left leg r/t back    Tobacco History: Social History   Tobacco Use  Smoking Status Never Smoker  Smokeless Tobacco Never Used   Counseling given: Not Answered   Outpatient Encounter Medications as of 06/29/2017  Medication Sig  . amiodarone (PACERONE) 200 MG tablet TAKE 1 TABLET BY MOUTH DAILY  . apixaban (ELIQUIS) 5 MG TABS tablet Take 1 tablet (5 mg total) by mouth 2 (two) times daily.  . Ascorbic Acid (VITAMIN C WITH ROSE HIPS) 1000 MG tablet Take 1,000 mg by mouth daily.  . benzonatate (TESSALON) 200 MG capsule Take 1 capsule (200 mg total)  by mouth 3 (three) times daily as needed for cough.  Marland Kitchen BIOTIN PO Take by mouth.  Marland Kitchen buPROPion (WELLBUTRIN XL) 300 MG 24 hr tablet Take 1 tablet (300 mg total) by mouth daily.  Marland Kitchen CALCIUM PO Take 1 tablet by mouth every morning.  . Cholecalciferol (VITAMIN D) 2000 UNITS tablet Take 4,000 Units by mouth daily.  . [facility] MELATONIN PO Take 1 tablet by mouth at bedtime as needed.  Marland Kitchen dextromethorphan (DELSYM) 30 MG/5ML liquid Take 5 mLs by mouth daily.  . fexofenadine (ALLEGRA) 180 MG tablet Take 180 mg by mouth daily as needed for allergies or rhinitis.  . furosemide (LASIX) 20 MG tablet TAKE 1 TABLET BY MOUTH ONCE DAILY  . guaiFENesin (MUCINEX) 600 MG 12 hr tablet Take 600 mg by mouth 2 (two) times daily.  Marland Kitchen KLOR-CON M20 20 MEQ tablet TAKE 1 TABLET BY MOUTH ONCE DAILY  . metoprolol succinate (TOPROL-XL) 25 MG 24 hr tablet TAKE 1/2 (ONE-HALF) TABLET BY MOUTH ONCE DAILY  . pravastatin (PRAVACHOL) 40 MG tablet Take 40 mg by mouth at bedtime.   No facility-administered encounter medications on file as of 06/29/2017.      Review of Systems  Constitutional:   No  weight loss, night sweats,  fevers, chills, fatigue, or  lassitude HEENT:   No headaches,  Difficulty swallowing,  Tooth/dental problems, or  Sore throat, No sneezing, itching, ear ache, nasal congestion, post nasal drip  CV: No chest pain,  orthopnea, PND, swelling in lower extremities, anasarca, dizziness, palpitations, syncope  GI: No heartburn, indigestion, abdominal pain, nausea, vomiting, diarrhea, change in bowel habits, loss of appetite, bloody stools Resp: No shortness of breath with exertion or at rest.  No excess mucus, no productive cough,  No non-productive cough,  No coughing up of blood.  No change in color of mucus.  No wheezing.  No chest wall deformity Skin: no rash, lesions, no skin changes. GU: no dysuria, change in color of urine, no urgency or frequency.  No flank pain, no hematuria  MS:  No joint pain or swelling.  No  decreased range of motion.  No back pain. Psych:  No change in mood or affect. No depression or anxiety.  No memory loss.   Physical Exam  There were no vitals taken for this visit.  GEN: A/Ox3; pleasant , NAD, well nourished    HEENT:  [place]/AT,  EACs-clear, TMs-wnl, NOSE-clear, THROAT-clear, no lesions, no postnasal drip or exudate noted.   NECK:  Supple w/ fair ROM; no JVD; normal carotid impulses w/o bruits; no thyromegaly or nodules palpated; no lymphadenopathy.    RESP:  Clear  P & A; w/o, wheezes/ rales/  or rhonchi. no accessory muscle use, no dullness to percussion  CARD:  RRR, no m/r/g, no peripheral edema, pulses intact, no cyanosis or clubbing.  GI:   Soft & nt; nml bowel sounds; no organomegaly or masses detected.   Musco: Warm bil, no deformities or joint swelling noted.   Neuro: alert, no focal deficits noted.    Skin: Warm, no lesions or rashes    Lab Results:  CBC    Component Value Date/Time   WBC 5.6 03/19/2017 1636   RBC 4.25 03/19/2017 1636   HGB 13.3 03/19/2017 1636   HCT 39.8 03/19/2017 1636   PLT 188.0 03/19/2017 1636   MCV 93.7 03/19/2017 1636   MCH 30.6 10/09/2016 0611   MCHC 33.5 03/19/2017 1636   RDW 14.8 03/19/2017 1636   LYMPHSABS 2.1 03/19/2017 1636   MONOABS 0.7 03/19/2017 1636   EOSABS 0.3 03/19/2017 1636   BASOSABS 0.1 03/19/2017 1636    BMET    Component Value Date/Time   NA 144 10/30/2016 1145   K 4.2 10/30/2016 1145   CL 101 10/30/2016 1145   CO2 27 10/30/2016 1145   GLUCOSE 125 (H) 10/30/2016 1145   GLUCOSE 118 (H) 10/09/2016 0611   BUN 15 10/30/2016 1145   CREATININE 1.34 (H) 10/30/2016 1145   CALCIUM 9.8 10/30/2016 1145   GFRNONAA 40 (L) 10/30/2016 1145   GFRAA 46 (L) 10/30/2016 1145    BNP    Component Value Date/Time   BNP 150.2 (H) 02/17/2016 1644    ProBNP No results found for: PROBNP  Imaging: No results found.   Assessment & Plan:   No problem-specific Assessment & Plan notes found for this  encounter.     Lauraine Rinne, NP 06/29/2017

## 2017-07-08 ENCOUNTER — Other Ambulatory Visit: Payer: Self-pay | Admitting: Adult Health

## 2017-08-03 ENCOUNTER — Telehealth: Payer: Self-pay | Admitting: Internal Medicine

## 2017-08-03 NOTE — Telephone Encounter (Signed)
New message  Pt is having allergy test on 08/11/2017, wants to know if needs to continue to take heart  medicine as normal.

## 2017-08-03 NOTE — Telephone Encounter (Signed)
Advised OK to take cardiac meds as normal unless instructed differently by MD who ordered allergy testing.

## 2017-08-11 DIAGNOSIS — J3089 Other allergic rhinitis: Secondary | ICD-10-CM | POA: Diagnosis not present

## 2017-08-11 DIAGNOSIS — J301 Allergic rhinitis due to pollen: Secondary | ICD-10-CM | POA: Diagnosis not present

## 2017-08-11 DIAGNOSIS — R05 Cough: Secondary | ICD-10-CM | POA: Diagnosis not present

## 2017-09-14 ENCOUNTER — Other Ambulatory Visit: Payer: Self-pay | Admitting: Family Medicine

## 2017-09-14 DIAGNOSIS — Z1231 Encounter for screening mammogram for malignant neoplasm of breast: Secondary | ICD-10-CM

## 2017-10-09 ENCOUNTER — Other Ambulatory Visit (HOSPITAL_COMMUNITY): Payer: Self-pay | Admitting: Internal Medicine

## 2017-10-11 NOTE — Telephone Encounter (Signed)
Refill Request.  

## 2017-10-13 ENCOUNTER — Ambulatory Visit
Admission: RE | Admit: 2017-10-13 | Discharge: 2017-10-13 | Disposition: A | Discharge status: Home / Self Care | Payer: Medicare HMO | Source: Ambulatory Visit | Attending: Family Medicine | Admitting: Family Medicine

## 2017-10-13 DIAGNOSIS — Z1231 Encounter for screening mammogram for malignant neoplasm of breast: Secondary | ICD-10-CM

## 2017-10-18 ENCOUNTER — Ambulatory Visit: Payer: Self-pay | Admitting: Adult Health

## 2017-10-19 ENCOUNTER — Encounter: Payer: Self-pay | Admitting: Adult Health

## 2017-10-19 ENCOUNTER — Ambulatory Visit: Payer: Medicare HMO | Admitting: Adult Health

## 2017-10-19 VITALS — BP 142/86 | HR 63 | Ht 67.0 in | Wt 242.0 lb

## 2017-10-19 DIAGNOSIS — G4733 Obstructive sleep apnea (adult) (pediatric): Secondary | ICD-10-CM

## 2017-10-19 DIAGNOSIS — Z79899 Other long term (current) drug therapy: Secondary | ICD-10-CM

## 2017-10-19 DIAGNOSIS — J310 Chronic rhinitis: Secondary | ICD-10-CM | POA: Diagnosis not present

## 2017-10-19 DIAGNOSIS — R05 Cough: Secondary | ICD-10-CM | POA: Diagnosis not present

## 2017-10-19 DIAGNOSIS — R053 Chronic cough: Secondary | ICD-10-CM

## 2017-10-19 DIAGNOSIS — R059 Cough, unspecified: Secondary | ICD-10-CM

## 2017-10-19 HISTORY — DX: Other long term (current) drug therapy: Z79.899

## 2017-10-19 HISTORY — DX: Obstructive sleep apnea (adult) (pediatric): G47.33

## 2017-10-19 MED ORDER — PREDNISONE 10 MG PO TABS: ORAL_TABLET | ORAL | 0 refills | Status: DC

## 2017-10-19 NOTE — Assessment & Plan Note (Signed)
OSA -CPAP intolerant  She has mild OSA , will try referral for oral appliance .

## 2017-10-19 NOTE — Assessment & Plan Note (Addendum)
Extensive workup that has been essenitally unrevealing . Suspect sinus drainage is contributing  Needs to stop daytime chlortrimeton .  Mild fare today .   Plan  Patient Instructions  Prednisone taper over next week.   Mucinex Twice daily As needed  Only if you get thick mucus /congestion .  Continue on Delsym 2 tsp Twice daily  For cough As needed   Continue on Tessalon Three times a day  For cough As needed   Continue on Allegra 180mg  daily in am .  May use Chlorpheniramine 4mg  1 At bedtime  As needed.  (this is a decreased dose, do not use during daytime )  Saline nasal rinses Twice daily  Voice rest , sips of water .  NO MINTS .  Call about oral appliance for your Sleep apnea. If does not work we need to look at CPAP again.  Follow up in 3 months with PFT and As needed   Please contact office for sooner follow up if symptoms do not improve or worsen or seek emergency care

## 2017-10-19 NOTE — Patient Instructions (Addendum)
Prednisone taper over next week.   Mucinex Twice daily As needed  Only if you get thick mucus /congestion .  Continue on Delsym 2 tsp Twice daily  For cough As needed   Continue on Tessalon Three times a day  For cough As needed   Continue on Allegra 180mg  daily in am .  May use Chlorpheniramine 4mg  1 At bedtime  As needed.  (this is a decreased dose, do not use during daytime )  Saline nasal rinses Twice daily  Voice rest , sips of water .  NO MINTS .  Call about oral appliance for your Sleep apnea. If does not work we need to look at CPAP again.  Follow up in 3 months with PFT and As needed   Please contact office for sooner follow up if symptoms do not improve or worsen or seek emergency care

## 2017-10-19 NOTE — Progress Notes (Signed)
@Patient  ID: [name] [name], female    DOB: April 11, 1945, 72 y.o.   MRN: [id number]  Chief Complaint  Patient presents with  . Follow-up    Cough     Referring provider: Harlan Stains, MD  HPI: 72 year old female never smoker followed for chronic cough present since 2009 (worse since 2016) Medical history significant for A. fib on amiodarone (started February 2019 ) OSA -CPAP intolerant   TEST /Events :  Chronic cough hx Neg CXR  ENT evaluation 2019 neg per pt  GI w/up neg for GERD per pt  Basement but does not use No hot tub use( has one but not functioning )  No chickens, birds, occupatonal /hobby exposure.  NoAutoimmune dz, no macrodantin use. On amiodarone 02/2017  PFT October 2018 showed FEV1 at 88%, ratio 80, FVC 83%, no significant bronchodilator response,mid flow reversibility, DLCO 71% CT chest March 26, 2017> negative for ILD, patchy air trapping in both lungs, thin parenchymal bands in the mid to lower lungs compatible with a mild postinfectious or postinflammatory scarring, 3 mm right lower lobe nodule. ESR March 2019 normal March 2019 allergy profile and IgE and CBC with differential normal  10/19/2017 Follow up : Chronic Cough  Patient presents for a 18-monthfollow-up for chronic cough that has been present since 2009.  She has been treated with multiple regimens over the years including inhalers, steroids, PPI, H2 blockers,  antibiotics without significant improvement.  She has been instructed to use Mucinex Delsym Tessalon and Allegra along with cold Lortabs to help with cough.  Extensive  work-up has been unrevealing,  high-resolution CT chest 03/2017 showed no evidence of interstitial lung disease. Normal ESR, allergy profile, IgE and CBC with differential. Patient is on amiodarone which was started in February 2019.   Since last visit patient is feeling not as well last couple of weeks with increased cough , more barking cough at times, sinus drainage , throat  clearing . Still taking tessalon, delsym and mucinex, allegra. And chlortrimeton. Feels some of the meds are making her off balance . Feels tired a lot .  She has long standing sleep apnea. Repeat sleep study 2018 showed mild OSA . Says she has not been able to wear CPAP in past. Pt education on sleep apnea.   Patient was recently seen by allergist with allergy testing that showed 1-2 positive histamines otherwise unrevealing. She was recommended to use atrovent and astelin.     Allergies  Allergen Reactions  . Lexapro [Escitalopram]     Makes her feel funny  . Peroxide [Hydrogen Peroxide] Other (See Comments)    Redness.   .Dot Lanes[Sertraline Hcl]     Makes her feel funny  . Neosporin [Neomycin-Bacitracin Zn-Polymyx] Rash    Blisters, itching.     Immunization History  Administered Date(s) Administered  . Influenza, High Dose Seasonal PF 10/09/2016    Past Medical History:  Diagnosis Date  . Anxiety   . Arthritis   . Cataracts, bilateral    immature  . CKD (chronic kidney disease) stage 3, GFR 30-59 ml/min (HCC)   . Depression   . GERD (gastroesophageal reflux disease)    was on Nexium   . GERD (gastroesophageal reflux disease)   . Hemorrhoid   . History of bronchitis    couple of yrs ago  . History of colon polyps   . History of peristent atrial fibrillation   . Hyperlipidemia    takes Pravastatin daily  . Hypertension  takes Amlodipine and Diovan daily  . Hypertension   . Pre-diabetes   . Sleep apnea    no cpap used  . Sleep apnea    study done 20+yrs ago and doesn't use cpap  . Urinary urgency   . Weakness    in left leg r/t back    Tobacco History: Social History   Tobacco Use  Smoking Status Never Smoker  Smokeless Tobacco Never Used   Counseling given: Not Answered   Outpatient Medications Prior to Visit  Medication Sig Dispense Refill  . amiodarone (PACERONE) 200 MG tablet TAKE 1 TABLET BY MOUTH DAILY 90 tablet 2  . apixaban (ELIQUIS) 5  MG TABS tablet Take 1 tablet (5 mg total) by mouth 2 (two) times daily. 180 tablet 2  . Ascorbic Acid (VITAMIN C WITH ROSE HIPS) 1000 MG tablet Take 1,000 mg by mouth daily.    Marland Kitchen azelastine (ASTELIN) 0.1 % nasal spray Place 1 spray into both nostrils 2 (two) times daily. Use in each nostril as directed    . benzonatate (TESSALON) 200 MG capsule TAKE 1 CAPSULE BY MOUTH THREE TIMES DAILY AS NEEDED FOR COUGH 60 capsule 3  . BIOTIN PO Take by mouth.    Marland Kitchen buPROPion (WELLBUTRIN XL) 300 MG 24 hr tablet Take 1 tablet (300 mg total) by mouth daily. 90 tablet 0  . CALCIUM PO Take 1 tablet by mouth every morning.    . Cholecalciferol (VITAMIN D) 2000 UNITS tablet Take 4,000 Units by mouth daily.    . [facility] MELATONIN PO Take 1 tablet by mouth at bedtime as needed.    Marland Kitchen dextromethorphan (DELSYM) 30 MG/5ML liquid Take 5 mLs by mouth daily.    . fexofenadine (ALLEGRA) 180 MG tablet Take 180 mg by mouth daily as needed for allergies or rhinitis.    . furosemide (LASIX) 20 MG tablet TAKE 1 TABLET BY MOUTH ONCE DAILY 90 tablet 2  . guaiFENesin (MUCINEX) 600 MG 12 hr tablet Take 600 mg by mouth 2 (two) times daily.    Marland Kitchen ipratropium (ATROVENT) 0.06 % nasal spray Place 2 sprays into both nostrils 2 (two) times daily.    . metoprolol succinate (TOPROL-XL) 25 MG 24 hr tablet TAKE 1/2 (ONE-HALF) TABLET BY MOUTH ONCE DAILY 30 tablet 10  . potassium chloride SA (K-DUR,KLOR-CON) 20 MEQ tablet TAKE 1 TABLET BY MOUTH ONCE DAILY 90 tablet 1  . pravastatin (PRAVACHOL) 40 MG tablet Take 40 mg by mouth at bedtime.     No facility-administered medications prior to visit.      Review of Systems  Constitutional:   No  weight loss, night sweats,  Fevers, chills,  +fatigue, or  lassitude.  HEENT:   No headaches,  Difficulty swallowing,  Tooth/dental problems, or  Sore throat,                No sneezing, itching, ear ache,  +nasal congestion, post nasal drip,   CV:  No chest pain,  Orthopnea, PND, swelling in lower  extremities, anasarca, dizziness, palpitations, syncope.   GI  No heartburn, indigestion, abdominal pain, nausea, vomiting, diarrhea, change in bowel habits, loss of appetite, bloody stools.   Resp:   No chest wall deformity  Skin: no rash or lesions.  GU: no dysuria, change in color of urine, no urgency or frequency.  No flank pain, no hematuria   MS:  No joint pain or swelling.  No decreased range of motion.  No back pain.    Physical Exam  BP (!) 142/86 (BP Location: Left Arm)   Pulse 63   Ht 5' 7"  (1.702 m)   Wt 242 lb (109.8 kg)   SpO2 97%   BMI 37.90 kg/m   GEN: A/Ox3; pleasant , NAD, obese    HEENT:  [place]/AT,  EACs-clear, TMs-wnl, NOSE-clear drainage , THROAT-clear, no lesions, no postnasal drip or exudate noted.   NECK:  Supple w/ fair ROM; no JVD; normal carotid impulses w/o bruits; no thyromegaly or nodules palpated; no lymphadenopathy.    RESP  Clear  P & A; w/o, wheezes/ rales/ or rhonchi. no accessory muscle use, no dullness to percussion  CARD:  RRR, no m/r/g, no peripheral edema, pulses intact, no cyanosis or clubbing.  GI:   Soft & nt; nml bowel sounds; no organomegaly or masses detected.   Musco: Warm bil, no deformities or joint swelling noted.   Neuro: alert, no focal deficits noted.    Skin: Warm, no lesions or rashes    Lab Results:  CBC  BMET  ProBNP No results found for: PROBNP    Assessment & Plan:   Chronic cough Extensive workup that has been essenitally unrevealing . Suspect sinus drainage is contributing  Needs to stop daytime chlortrimeton .   Plan  Patient Instructions  Prednisone taper over next week.   Mucinex Twice daily As needed  Only if you get thick mucus /congestion .  Continue on Delsym 2 tsp Twice daily  For cough As needed   Continue on Tessalon Three times a day  For cough As needed   Continue on Allegra 142m daily in am .  May use Chlorpheniramine 418m1 At bedtime  As needed.  (this is a decreased dose, do  not use during daytime )  Saline nasal rinses Twice daily  Voice rest , sips of water .  NO MINTS .  Call about oral appliance for your Sleep apnea. If does not work we need to look at CPAP again.  Follow up in 3 months with PFT and As needed   Please contact office for sooner follow up if symptoms do not improve or worsen or seek emergency care        Chronic rhinitis Cont on astelin and atrovent nasal spray .  Along with allegra.   Long term current use of amiodarone Check PFT on return   OSA (obstructive sleep apnea) OSA -CPAP intolerant  She has mild OSA , will try referral for oral appliance .      TaRexene EdisonNP 10/19/2017

## 2017-10-19 NOTE — Assessment & Plan Note (Signed)
Check PFT on return

## 2017-10-19 NOTE — Assessment & Plan Note (Signed)
Cont on astelin and atrovent nasal spray .  Along with allegra.

## 2017-11-01 ENCOUNTER — Ambulatory Visit: Payer: Medicare HMO | Admitting: Internal Medicine

## 2017-11-01 ENCOUNTER — Encounter: Payer: Self-pay | Admitting: Internal Medicine

## 2017-11-01 VITALS — BP 168/90 | HR 62 | Ht 67.0 in | Wt 241.0 lb

## 2017-11-01 DIAGNOSIS — I4819 Other persistent atrial fibrillation: Secondary | ICD-10-CM | POA: Diagnosis not present

## 2017-11-01 DIAGNOSIS — I10 Essential (primary) hypertension: Secondary | ICD-10-CM | POA: Diagnosis not present

## 2017-11-01 DIAGNOSIS — R0609 Other forms of dyspnea: Secondary | ICD-10-CM

## 2017-11-01 DIAGNOSIS — Z79899 Other long term (current) drug therapy: Secondary | ICD-10-CM | POA: Diagnosis not present

## 2017-11-01 DIAGNOSIS — R0600 Dyspnea, unspecified: Secondary | ICD-10-CM

## 2017-11-01 MED ORDER — LOSARTAN POTASSIUM 25 MG PO TABS: 25.0000 mg | ORAL_TABLET | Freq: Every day | ORAL | 3 refills | Status: DC

## 2017-11-01 NOTE — Addendum Note (Signed)
Addended by: Frederik Schmidt on: 11/01/2017 12:46 PM   Modules accepted: Orders

## 2017-11-01 NOTE — Progress Notes (Signed)
Follow-up Outpatient Visit Date: 11/01/2017  Primary Care Provider: Harlan Stains, [place] [id number]  Chief Complaint: Follow-up atrial fibrillation  HPI:  Ms. Strom is a 72 y.o. year-old female with history of persistent atrial fibrillation, CKD stage III, HTN, HLD, OSA, and borderline diabetes, who presents for follow-up of atrial fibrillation.  I last saw Ms. Mckoy in April, at which time she was doing well other than a slow to improve cough.  We discussed decreasing amiodarone to 100 mg daily but agreed to defer this.  Today, Ms. Hendry reports feeling well.  She still has some exertional dyspnea when climbing stairs or walking extended periods.  This has not changed for years.  She denies chest pain, palpitations, lightheadedness, orthopnea, and PND.  She continues to have an intermittent cough, though this has improved.  She is scheduled to follow-up with pulmonology in 01/2018.  She has occasional left leg swelling, which has been chronic following ankle and knee surgeries.  She is tolerating her medications well, including apixaban.  She denies significant bleeding.  --------------------------------------------------------------------------------------------------  Cardiovascular History & Procedures: Cardiovascular Problems:  Persistent atrial fibrillation  Risk Factors:  Hypertension, hyperlipidemia, and age greater than 51  Cath/PCI:  None  CV Surgery:  None  EP Procedures and Devices:  None  Non-Invasive Evaluation(s):  Pharmacologic MPI (07/07/16): Low risk study without ischemia or scar. LVEF 64%.  TEE (02/24/16): LVEF depressed but difficult to assess. Trace AI. Moderate MR. Large LA without thrombus. Question small PFO. Normal RV size with depressed function. Mild TR.  TTE (02/18/16): Normal LV size and wall thickness with EF 50-55%. Normal wall motion. MAC with mild MR. Moderate LA enlargement. Mildly dilated RV  with normal contraction. Mild TR. Mildly elevated PA pressure.  Stress test (15-20 years ago): Normal per patient's report.  Recent CV Pertinent Labs: Lab Results  Component Value Date   CHOL 125 02/18/2016   HDL 41 02/18/2016   LDLCALC 66 02/18/2016   TRIG 90 02/18/2016   CHOLHDL 3.0 02/18/2016   INR 0.91 10/07/2016   BNP 150.2 (H) 02/17/2016   K 4.2 10/30/2016   MG 1.7 02/17/2016   BUN 15 10/30/2016   CREATININE 1.34 (H) 10/30/2016    Past medical and surgical history were reviewed and updated in EPIC.  Current Meds  Medication Sig  . amiodarone (PACERONE) 200 MG tablet TAKE 1 TABLET BY MOUTH DAILY  . apixaban (ELIQUIS) 5 MG TABS tablet Take 1 tablet (5 mg total) by mouth 2 (two) times daily.  . Ascorbic Acid (VITAMIN C WITH ROSE HIPS) 1000 MG tablet Take 1,000 mg by mouth daily.  Marland Kitchen azelastine (ASTELIN) 0.1 % nasal spray Place 1 spray into both nostrils 2 (two) times daily. Use in each nostril as directed  . benzonatate (TESSALON) 200 MG capsule TAKE 1 CAPSULE BY MOUTH THREE TIMES DAILY AS NEEDED FOR COUGH  . BIOTIN PO Take by mouth.  Marland Kitchen buPROPion (WELLBUTRIN XL) 300 MG 24 hr tablet Take 1 tablet (300 mg total) by mouth daily.  Marland Kitchen CALCIUM PO Take 1 tablet by mouth every morning.  . chlorpheniramine (CHLOR-TRIMETON) 4 MG tablet Take 4 mg by mouth at bedtime.  . Cholecalciferol (VITAMIN D) 2000 UNITS tablet Take 4,000 Units by mouth daily.  . CVS MELATONIN PO Take 1 tablet by mouth at bedtime as needed.  Marland Kitchen dextromethorphan (DELSYM) 30 MG/5ML liquid Take 5 mLs by mouth daily.  . fexofenadine (ALLEGRA) 180 MG tablet Take 180 mg  by mouth daily as needed for allergies or rhinitis.  . furosemide (LASIX) 20 MG tablet TAKE 1 TABLET BY MOUTH ONCE DAILY  . guaiFENesin (MUCINEX) 600 MG 12 hr tablet Take 600 mg by mouth 2 (two) times daily.  Marland Kitchen ipratropium (ATROVENT) 0.06 % nasal spray Place 2 sprays into both nostrils 2 (two) times daily.  . metoprolol succinate (TOPROL-XL) 25 MG 24 hr  tablet TAKE 1/2 (ONE-HALF) TABLET BY MOUTH ONCE DAILY  . potassium chloride SA (K-DUR,KLOR-CON) 20 MEQ tablet TAKE 1 TABLET BY MOUTH ONCE DAILY  . pravastatin (PRAVACHOL) 40 MG tablet Take 40 mg by mouth at bedtime.  . predniSONE (DELTASONE) 10 MG tablet 4 tabs for 2 days, then 3 tabs for 2 days, 2 tabs for 2 days, then 1 tab for 2 days, then stop    Allergies: Lexapro [escitalopram]; Peroxide [hydrogen peroxide]; Zoloft [sertraline hcl]; and Neosporin [neomycin-bacitracin zn-polymyx]  Social History   Tobacco Use  . Smoking status: Never Smoker  . Smokeless tobacco: Never Used  Substance Use Topics  . Alcohol use: No  . Drug use: No    Family History  Problem Relation Age of Onset  . Colon cancer Mother   . Heart disease Father   . Breast cancer Neg Hx     Review of Systems: A 12-system review of systems was performed and was negative except as noted in the HPI.  --------------------------------------------------------------------------------------------------  Physical Exam: BP (!) 168/90   Pulse 62   Ht 5' 7"  (1.702 m)   Wt 241 lb (109.3 kg)   BMI 37.75 kg/m   General: NAD.  Accompanied by her husband. HEENT: No conjunctival pallor or scleral icterus. Moist mucous membranes.  OP clear. Neck: Supple without lymphadenopathy, thyromegaly, JVD, or HJR. Lungs: Normal work of breathing. Clear to auscultation bilaterally without wheezes or crackles. Heart: Regular rate and rhythm without murmurs, rubs, or gallops.  Unable to assess PMI due to body habitus. Abd: Bowel sounds present. Soft, NT/ND.  Unable to assess HSM due to body habitus. Ext: No lower extremity edema. Radial, PT, and DP pulses are 2+ bilaterally. Skin: Warm and dry without rash.  EKG: Normal sinus rhythm with low voltage and nonspecific ST changes.  Lab Results  Component Value Date   WBC 5.6 03/19/2017   HGB 13.3 03/19/2017   HCT 39.8 03/19/2017   MCV 93.7 03/19/2017   PLT 188.0 03/19/2017     Lab Results  Component Value Date   NA 144 10/30/2016   K 4.2 10/30/2016   CL 101 10/30/2016   CO2 27 10/30/2016   BUN 15 10/30/2016   CREATININE 1.34 (H) 10/30/2016   GLUCOSE 125 (H) 10/30/2016   ALT 12 10/30/2016    Lab Results  Component Value Date   CHOL 125 02/18/2016   HDL 41 02/18/2016   LDLCALC 66 02/18/2016   TRIG 90 02/18/2016   CHOLHDL 3.0 02/18/2016    --------------------------------------------------------------------------------------------------  ASSESSMENT AND PLAN: Persistent atrial fibrillation No evidence of recurrence.  Continue amiodarone and apixaban.  We will plan to repeat LFTs and TSH when she returns for labs in about 2 weeks.  Ms. Merfeld should continue to follow-up with pulmonology including routine PFTs, given long-term amiodarone use.  Dyspnea on exertion Long-standing and likely multifactorial.  Ms. Blatchford looks grossly euvolemic on exam today.  I encouraged her to continue walking and to try to lose weight.  Hypertension Blood pressure suboptimally controlled today.  We discussed lifestyle modifications including sodium restriction.  We have also  agreed to start losartan 25 mg daily (she was previously on valsartan, though this was discontinued in the setting of hypotension from A. fib with RVR).  I will check a basic metabolic panel today and again in about 2 weeks.  We will also recheck her blood pressure at that time.  Follow-up: Return to clinic for blood pressure check and labs in 2 weeks.  Given my transition to [place], she will follow-up with Dr. Marlou Porch in about 6 months.  Nelva Bush, MD 11/01/2017 12:34 PM

## 2017-11-01 NOTE — Patient Instructions (Signed)
Medication Instructions:  START Losartan 25 mg daily  If you need a refill on your cardiac medications before your next appointment, please call your pharmacy.   Lab work:TODAY BMET If you have labs (blood work) drawn today and your tests are completely normal, you will receive your results only by: Marland Kitchen MyChart Message (if you have MyChart) OR . A paper copy in the mail If you have any lab test that is abnormal or we need to change your treatment, we will call you to review the results.  Testing/Procedures:   Follow-Up: At [facility], you and your health needs are our priority.  As part of our continuing mission to provide you with exceptional heart care, we have created designated Provider Care Teams.  These Care Teams include your primary Cardiologist (physician) and Advanced Practice Providers (APPs -  Physician Assistants and Nurse Practitioners) who all work together to provide you with the care you need, when you need it. You will need a follow up appointment in 6 months.  Please call our office 2 months in advance to schedule this appointment.  You may see Dr Marlou Porch or one of the following Advanced Practice Providers on your designated Care Team:   Truitt Merle, NP Cecilie Kicks, NP . Kathyrn Drown, NP  Any Other Special Instructions Will Be Listed Below (If Applicable).  NURSE VISIT BP check 2 weeks BMET 2 weeks (same day as nurse visit)

## 2017-11-02 ENCOUNTER — Telehealth: Payer: Self-pay

## 2017-11-02 LAB — BASIC METABOLIC PANEL
BUN / CREAT RATIO: 9 — AB (ref 12–28)
BUN: 13 mg/dL (ref 8–27)
CO2: 29 mmol/L (ref 20–29)
CREATININE: 1.51 mg/dL — AB (ref 0.57–1.00)
Calcium: 9.9 mg/dL (ref 8.7–10.3)
Chloride: 100 mmol/L (ref 96–106)
GFR, EST AFRICAN AMERICAN: 40 mL/min/{1.73_m2} — AB (ref >59)
GFR, EST NON AFRICAN AMERICAN: 35 mL/min/{1.73_m2} — AB (ref >59)
Glucose: 117 mg/dL — ABNORMAL HIGH (ref 65–99)
Potassium: 3.9 mmol/L (ref 3.5–5.2)
Sodium: 143 mmol/L (ref 134–144)

## 2017-11-02 MED ORDER — AMLODIPINE BESYLATE 5 MG PO TABS: 5.0000 mg | ORAL_TABLET | Freq: Every day | ORAL | 3 refills | Status: DC

## 2017-11-02 NOTE — Telephone Encounter (Signed)
Notes recorded by Frederik Schmidt, RN on 11/02/2017 at 8:14 AM EDT The patient has been notified of the result and verbalized understanding. All questions (if any) were answered. Patient informed to not take Losartan and start Amlodipine 5 mg daily and come in for scheduled labs and BP check. Frederik Schmidt, RN 11/02/2017 8:12 AM

## 2017-11-02 NOTE — Telephone Encounter (Signed)
-----   Message from Nelva Bush, MD sent at 11/02/2017  7:54 AM EDT ----- Please let Ms. Coderre know that her renal function is slightly worse than baseline.  In light of this, I recommend that she does not start losartan, as discussed yesterday, and begin taking amlodipine 5 mg daily instead.  She should still come in for BMP, LFT's, TSH, and blood pressure check in ~2 weeks.  She should increase her water intake to help hydrate her kidneys.

## 2017-11-06 DIAGNOSIS — Z23 Encounter for immunization: Secondary | ICD-10-CM | POA: Diagnosis not present

## 2017-11-08 ENCOUNTER — Other Ambulatory Visit: Payer: Self-pay | Admitting: Internal Medicine

## 2017-11-09 NOTE — Telephone Encounter (Signed)
Refill Request.  

## 2017-11-12 ENCOUNTER — Telehealth: Payer: Self-pay | Admitting: Internal Medicine

## 2017-11-12 NOTE — Telephone Encounter (Signed)
Spoke to patient and clarified that she is taking Amlodipine instead of Losartan.  She verbalized "yes" and will come in 11/4 for nurse visit.

## 2017-11-12 NOTE — Telephone Encounter (Signed)
New message: ° ° ° ° ° ° °Pt is returning a call °

## 2017-11-15 ENCOUNTER — Other Ambulatory Visit: Payer: Medicare HMO | Admitting: *Deleted

## 2017-11-15 ENCOUNTER — Telehealth: Payer: Self-pay

## 2017-11-15 ENCOUNTER — Ambulatory Visit (INDEPENDENT_AMBULATORY_CARE_PROVIDER_SITE_OTHER): Payer: Medicare HMO

## 2017-11-15 VITALS — BP 140/60 | HR 62 | Ht 67.0 in | Wt 241.8 lb

## 2017-11-15 DIAGNOSIS — I10 Essential (primary) hypertension: Secondary | ICD-10-CM

## 2017-11-15 DIAGNOSIS — I4819 Other persistent atrial fibrillation: Secondary | ICD-10-CM | POA: Diagnosis not present

## 2017-11-15 DIAGNOSIS — R0609 Other forms of dyspnea: Secondary | ICD-10-CM | POA: Diagnosis not present

## 2017-11-15 DIAGNOSIS — Z79899 Other long term (current) drug therapy: Secondary | ICD-10-CM | POA: Diagnosis not present

## 2017-11-15 DIAGNOSIS — R0600 Dyspnea, unspecified: Secondary | ICD-10-CM

## 2017-11-15 LAB — HEPATIC FUNCTION PANEL
ALBUMIN: 3.7 g/dL (ref 3.5–4.8)
ALK PHOS: 75 IU/L (ref 39–117)
ALT: 32 IU/L (ref 0–32)
AST: 27 IU/L (ref 0–40)
Bilirubin Total: 0.4 mg/dL (ref 0.0–1.2)
Bilirubin, Direct: 0.12 mg/dL (ref 0.00–0.40)
Total Protein: 6 g/dL (ref 6.0–8.5)

## 2017-11-15 LAB — BASIC METABOLIC PANEL
BUN / CREAT RATIO: 11 — AB (ref 12–28)
BUN: 14 mg/dL (ref 8–27)
CO2: 25 mmol/L (ref 20–29)
CREATININE: 1.33 mg/dL — AB (ref 0.57–1.00)
Calcium: 9.1 mg/dL (ref 8.7–10.3)
Chloride: 102 mmol/L (ref 96–106)
GFR calc Af Amer: 46 mL/min/{1.73_m2} — ABNORMAL LOW (ref >59)
GFR, EST NON AFRICAN AMERICAN: 40 mL/min/{1.73_m2} — AB (ref >59)
Glucose: 127 mg/dL — ABNORMAL HIGH (ref 65–99)
POTASSIUM: 4.2 mmol/L (ref 3.5–5.2)
Sodium: 143 mmol/L (ref 134–144)

## 2017-11-15 LAB — TSH: TSH: 6.01 u[IU]/mL — AB (ref 0.450–4.500)

## 2017-11-15 NOTE — Progress Notes (Signed)
Patient should continue her current medication regimen and monitor her blood pressure at home.  If it rises to consistent levels above 140/90, she should let us know for further recommendations.  Nelva Bush, MD [facility] Pager: [phone]

## 2017-11-15 NOTE — Progress Notes (Signed)
1.) Reason for visit: Hypertension;  Medication started 10/22:  Amlodipine 5 mg daily  BP check/labs  2.) Name of MD requesting visit: Dr End  3.) H&P: see chart  4.) ROS related to problem: Patient in for OV 10/21 with BP of 168/90.  Dr End wanted to originally start Losartan, but after reviewing kidney function, he decided upon Amlodipine 5 mg daily. Today 11/4 BP 140/60  5.) Assessment and plan per MD: will forward to Dr End for further recommendations.

## 2017-11-15 NOTE — Telephone Encounter (Signed)
Patient came in for labs and BP check today after starting Amlodipine 5 mg daily on 10/22.  She brought her BP machine to cross-check with our readings.  Her BP machine 174/86 (took forever to deflate), our automatic machine 156/76 and manually I got 140/60.  She feels fine on the medication with no ill effects.  She brought readings from recently and they look to average 130s/60s.

## 2017-11-15 NOTE — Telephone Encounter (Signed)
Left the patient a message with Dr Darnelle Bos recommendations.  I told her to call if she had further questions.

## 2017-11-15 NOTE — Telephone Encounter (Signed)
Patient should continue her current medication regimen and monitor her blood pressure at home.  If it rises to consistent levels above 140/90, she should let us know for further recommendations.  Nelva Bush, MD [facility] Pager: [phone]

## 2017-11-16 ENCOUNTER — Telehealth: Payer: Self-pay

## 2017-11-16 DIAGNOSIS — R7989 Other specified abnormal findings of blood chemistry: Secondary | ICD-10-CM

## 2017-11-16 NOTE — Telephone Encounter (Signed)
-----   Message from Nelva Bush, MD sent at 11/15/2017 10:36 PM EST ----- Please let Ms. Perella know that her renal function is back to baseline.  Liver function is normal.  TSH is mildly elevated. I recommend repeating a TSH and free T4 in 1 month.  If thyroid function tests remain abnormal, we will need to consider endocrinology consult versus referral to EP to discuss stopping amiodarone in favor of another agent.

## 2017-11-16 NOTE — Telephone Encounter (Signed)
Notes recorded by Frederik Schmidt, RN on 11/16/2017 at 8:12 AM EST The patient has been notified of the result and verbalized understanding. TSH/FREE T4 labs 12/5. All questions (if any) were answered. Frederik Schmidt, RN 11/16/2017 8:12 AM

## 2017-12-16 ENCOUNTER — Other Ambulatory Visit: Payer: Medicare HMO | Admitting: *Deleted

## 2017-12-16 DIAGNOSIS — R7989 Other specified abnormal findings of blood chemistry: Secondary | ICD-10-CM | POA: Diagnosis not present

## 2017-12-17 LAB — T4, FREE: FREE T4: 1.09 ng/dL (ref 0.82–1.77)

## 2017-12-17 LAB — TSH: TSH: 5.53 u[IU]/mL — ABNORMAL HIGH (ref 0.450–4.500)

## 2017-12-20 ENCOUNTER — Telehealth: Payer: Self-pay

## 2017-12-20 DIAGNOSIS — R899 Unspecified abnormal finding in specimens from other organs, systems and tissues: Secondary | ICD-10-CM

## 2017-12-20 NOTE — Telephone Encounter (Signed)
-----   Message from Nelva Bush, MD sent at 12/19/2017 10:53 AM EST ----- Please let [name] [name] know that her TSH remains a little bit high but her free T4 is normal.  TSH and free T4 should be repeated in about 3 months.  She should continue her current medications and follow-up as previously arranged.

## 2017-12-20 NOTE — Telephone Encounter (Signed)
Notes recorded by Frederik Schmidt, RN on 12/20/2017 at 8:30 AM EST [id number] 12/9 ------

## 2017-12-20 NOTE — Telephone Encounter (Signed)
Notes recorded by Frederik Schmidt, RN on 12/20/2017 at 12:59 PM EST The patient has been notified of the result and verbalized understanding. Scheduled TSH/Free T4 Lab Draw 03/21/18. All questions (if any) were answered. Frederik Schmidt, RN 12/20/2017 12:58 PM

## 2017-12-22 ENCOUNTER — Other Ambulatory Visit: Payer: Self-pay | Admitting: Adult Health

## 2018-01-16 ENCOUNTER — Other Ambulatory Visit (HOSPITAL_COMMUNITY): Payer: Self-pay | Admitting: Internal Medicine

## 2018-01-17 NOTE — Telephone Encounter (Signed)
Refill Request.  

## 2018-01-18 ENCOUNTER — Other Ambulatory Visit (HOSPITAL_COMMUNITY): Payer: Self-pay | Admitting: Psychiatry

## 2018-01-18 DIAGNOSIS — F332 Major depressive disorder, recurrent severe without psychotic features: Secondary | ICD-10-CM

## 2018-01-19 ENCOUNTER — Ambulatory Visit (INDEPENDENT_AMBULATORY_CARE_PROVIDER_SITE_OTHER): Payer: Medicare Other | Admitting: Pulmonary Disease

## 2018-01-19 ENCOUNTER — Ambulatory Visit: Payer: Medicare HMO | Admitting: Adult Health

## 2018-01-19 DIAGNOSIS — R05 Cough: Secondary | ICD-10-CM | POA: Diagnosis not present

## 2018-01-19 DIAGNOSIS — R059 Cough, unspecified: Secondary | ICD-10-CM

## 2018-01-19 LAB — PULMONARY FUNCTION TEST
DL/VA % PRED: 88 %
DL/VA: 4.6 ml/min/mmHg/L
DLCO unc % pred: 70 %
DLCO unc: 20.33 ml/min/mmHg
FEF 25-75 Post: 1.74 L/sec
FEF 25-75 Pre: 1.61 L/sec
FEF2575-%Change-Post: 8 %
FEF2575-%Pred-Post: 85 %
FEF2575-%Pred-Pre: 78 %
FEV1-%Change-Post: 4 %
FEV1-%Pred-Post: 76 %
FEV1-%Pred-Pre: 73 %
FEV1-Post: 1.95 L
FEV1-Pre: 1.87 L
FEV1FVC-%Change-Post: 4 %
FEV1FVC-%Pred-Pre: 101 %
FEV6-%Change-Post: 0 %
FEV6-%PRED-PRE: 75 %
FEV6-%Pred-Post: 75 %
FEV6-Post: 2.43 L
FEV6-Pre: 2.42 L
FEV6FVC-%Pred-Post: 104 %
FEV6FVC-%Pred-Pre: 104 %
FVC-%Change-Post: 0 %
FVC-%Pred-Post: 71 %
FVC-%Pred-Pre: 71 %
FVC-Post: 2.43 L
FVC-Pre: 2.42 L
POST FEV6/FVC RATIO: 100 %
PRE FEV1/FVC RATIO: 77 %
Post FEV1/FVC ratio: 81 %
Pre FEV6/FVC Ratio: 100 %
RV % pred: 114 %
RV: 2.71 L
TLC % pred: 95 %
TLC: 5.26 L

## 2018-01-19 NOTE — Progress Notes (Signed)
PFT completed today.  

## 2018-01-20 ENCOUNTER — Ambulatory Visit: Payer: Medicare Other | Admitting: Adult Health

## 2018-01-20 ENCOUNTER — Other Ambulatory Visit (HOSPITAL_COMMUNITY): Payer: Self-pay | Admitting: Psychiatry

## 2018-01-20 ENCOUNTER — Encounter: Payer: Self-pay | Admitting: Adult Health

## 2018-01-20 DIAGNOSIS — F332 Major depressive disorder, recurrent severe without psychotic features: Secondary | ICD-10-CM

## 2018-01-20 DIAGNOSIS — G4733 Obstructive sleep apnea (adult) (pediatric): Secondary | ICD-10-CM

## 2018-01-20 DIAGNOSIS — R05 Cough: Secondary | ICD-10-CM | POA: Diagnosis not present

## 2018-01-20 DIAGNOSIS — R058 Other specified cough: Secondary | ICD-10-CM

## 2018-01-20 NOTE — Assessment & Plan Note (Signed)
Chronic cough since 2009, workup unrevealing ,. Improvement on trigger prevention and cough control rx  Will need to monitor closely as on Amiodarone.   Plan  Patient Instructions   Mucinex Twice daily As needed  Only if you get thick mucus /congestion .  Delsym 2 tsp Twice daily  For cough As needed   Tessalon Three times a day  For cough As needed   Continue on Allegra 180mg  daily in am .  Decrease  Chlorpheniramine 4mg  1 At bedtime  Saline nasal rinses Twice daily As needed   Voice rest , sips of water if cough returns .  NO MINTS .  Call about oral appliance for your Sleep apnea. If does not work we need to look at CPAP again.  Follow up in 3-4 months and As needed   Please contact office for sooner follow up if symptoms do not improve or worsen or seek emergency care

## 2018-01-20 NOTE — Patient Instructions (Addendum)
Mucinex Twice daily As needed  Only if you get thick mucus /congestion .  Delsym 2 tsp Twice daily  For cough As needed   Tessalon Three times a day  For cough As needed   Continue on Allegra 180mg  daily in am .  Decrease  Chlorpheniramine 4mg  1 At bedtime  Saline nasal rinses Twice daily As needed   Voice rest , sips of water if cough returns .  NO MINTS .  Call about oral appliance for your Sleep apnea. If does not work we need to look at CPAP again.  Follow up in 3-4 months and As needed   Please contact office for sooner follow up if symptoms do not improve or worsen or seek emergency care

## 2018-01-20 NOTE — Progress Notes (Signed)
$'@Patient'Z$  ID: [name] [name], female    DOB: 01-Oct-1945, 73 y.o.   MRN: [id number]  Chief Complaint  Patient presents with  . Follow-up    Cough     Referring provider: Harlan Stains, MD  HPI: 73 year old female never smoker followed for chronic cough present since 2009 (worse since 2016) Medical history significant for A. fib on amiodarone (started February 2019 ) OSA -CPAP intolerant   TEST /Events :  Chronic cough hx Neg CXR  ENT evaluation 2019 neg per pt  GI w/up neg for GERD per pt  Basement but does not use No hot tub use( has one but not functioning )  No chickens, birds, occupatonal /hobby exposure.  NoAutoimmune dz, no macrodantin use. On amiodarone 02/2017 PFT October 2018 showed FEV1 at 88%, ratio 80, FVC 83%, no significant bronchodilator response,mid flow reversibility, DLCO 71% CT chest March 26, 2017>negative for ILD, patchy air trapping in both lungs, thin parenchymal bands in the mid to lower lungs compatible with a mild postinfectious or postinflammatory scarring, 3 mm right lower lobe nodule. ESR March 2019 normal March 2019 allergy profile and IgE and CBC with differential normal   01/20/2018 Follow up : Chronic Cough  Patient returns for a 43-monthfollow-up.  Patient is followed for chronic cough that is been present since 2009.  She has had an extensive work-up that has been unrevealing.  High-resolution CT chest March 2019 showed no evidence of interstitial lung disease.  Allergy profile, IgE and CBC and ESR were normal. Last visit patient was having a flare of her cough.  She was treated with a prednisone taper.  And started on an aggressive cough regimen including Mucinex, Delsym, Tessalon, Allegra, Chlor-Trimeton. Since last visit patient says she is feeling much better.  Says that her cough is much improved.  Feels that her cough is 95% better.  Does have some occasional mucus that is in her throat. Patient says she is still taking her Mucinex  Delsym and Tessalon around-the-clock. Patient is on amiodarone which was started in February 2019.  She had pulmonary function test today that shows no significant change.  She has some mild to moderate restriction with an FEV1 at 76%, ratio 81, FVC 71%, no significant bronchodilator response, DLCO 70%.  This is similar to 2018 PFT ,   Pt has OSA but is intolerant to CPAP in past. Referred for oral appliance . She says she will follow up on this .    Allergies  Allergen Reactions  . Lexapro [Escitalopram]     Makes her feel funny  . Peroxide [Hydrogen Peroxide] Other (See Comments)    Redness.   .[place][Sertraline Hcl]     Makes her feel funny  . Neosporin [Neomycin-Bacitracin Zn-Polymyx] Rash    Blisters, itching.     Immunization History  Administered Date(s) Administered  . Influenza, High Dose Seasonal PF 10/09/2016, 10/12/2017    Past Medical History:  Diagnosis Date  . Anxiety   . Arthritis   . Cataracts, bilateral    immature  . CKD (chronic kidney disease) stage 3, GFR 30-59 ml/min (HCC)   . Depression   . GERD (gastroesophageal reflux disease)    was on Nexium   . GERD (gastroesophageal reflux disease)   . Hemorrhoid   . History of bronchitis    couple of yrs ago  . History of colon polyps   . History of peristent atrial fibrillation   . Hyperlipidemia    takes Pravastatin daily  .  Hypertension    takes Amlodipine and Diovan daily  . Hypertension   . Pre-diabetes   . Sleep apnea    no cpap used  . Sleep apnea    study done 20+yrs ago and doesn't use cpap  . Urinary urgency   . Weakness    in left leg r/t back    Tobacco History: Social History   Tobacco Use  Smoking Status Never Smoker  Smokeless Tobacco Never Used   Counseling given: Not Answered   Outpatient Medications Prior to Visit  Medication Sig Dispense Refill  . amiodarone (PACERONE) 200 MG tablet TAKE 1 TABLET BY MOUTH ONCE DAILY 90 tablet 3  . amLODipine (NORVASC) 5 MG tablet  Take 1 tablet (5 mg total) by mouth daily. 90 tablet 3  . apixaban (ELIQUIS) 5 MG TABS tablet Take 1 tablet (5 mg total) by mouth 2 (two) times daily. 180 tablet 2  . Ascorbic Acid (VITAMIN C WITH ROSE HIPS) 1000 MG tablet Take 1,000 mg by mouth daily.    . benzonatate (TESSALON) 200 MG capsule TAKE 1 CAPSULE BY MOUTH THREE TIMES DAILY AS NEEDED FOR COUGH 60 capsule 3  . buPROPion (WELLBUTRIN XL) 300 MG 24 hr tablet Take 1 tablet (300 mg total) by mouth daily. 90 tablet 0  . CALCIUM PO Take 1 tablet by mouth every morning.    . chlorpheniramine (CHLOR-TRIMETON) 4 MG tablet Take 4 mg by mouth at bedtime.    . Cholecalciferol (VITAMIN D) 2000 UNITS tablet Take 4,000 Units by mouth daily.    . CVS MELATONIN PO Take 1 tablet by mouth at bedtime as needed.    Marland Kitchen dextromethorphan (DELSYM) 30 MG/5ML liquid Take 5 mLs by mouth daily.    . fexofenadine (ALLEGRA) 180 MG tablet Take 180 mg by mouth daily as needed for allergies or rhinitis.    . furosemide (LASIX) 20 MG tablet TAKE 1 TABLET BY MOUTH ONCE DAILY 90 tablet 2  . guaiFENesin (MUCINEX) 600 MG 12 hr tablet Take 600 mg by mouth 2 (two) times daily.    . metoprolol succinate (TOPROL-XL) 25 MG 24 hr tablet TAKE 1/2 (ONE-HALF) TABLET BY MOUTH ONCE DAILY 30 tablet 9  . potassium chloride SA (K-DUR,KLOR-CON) 20 MEQ tablet TAKE 1 TABLET BY MOUTH ONCE DAILY 90 tablet 1  . pravastatin (PRAVACHOL) 40 MG tablet Take 40 mg by mouth at bedtime.    Marland Kitchen azelastine (ASTELIN) 0.1 % nasal spray Place 1 spray into both nostrils 2 (two) times daily. Use in each nostril as directed    . BIOTIN PO Take by mouth.    Marland Kitchen ipratropium (ATROVENT) 0.06 % nasal spray Place 2 sprays into both nostrils 2 (two) times daily.    . predniSONE (DELTASONE) 10 MG tablet 4 tabs for 2 days, then 3 tabs for 2 days, 2 tabs for 2 days, then 1 tab for 2 days, then stop (Patient not taking: Reported on 01/20/2018) 20 tablet 0   No facility-administered medications prior to visit.      Review  of Systems:   Constitutional:   No  weight loss, night sweats,  Fevers, chills, fatigue, or  lassitude.  HEENT:   No headaches,  Difficulty swallowing,  Tooth/dental problems, or  Sore throat,                No sneezing, itching, ear ache,  +nasal congestion, post nasal drip,   CV:  No chest pain,  Orthopnea, PND, swelling in lower extremities, anasarca, dizziness, palpitations,  syncope.   GI  No heartburn, indigestion, abdominal pain, nausea, vomiting, diarrhea, change in bowel habits, loss of appetite, bloody stools.   Resp:    No chest wall deformity  Skin: no rash or lesions.  GU: no dysuria, change in color of urine, no urgency or frequency.  No flank pain, no hematuria   MS:  No joint pain or swelling.  No decreased range of motion.  No back pain.    Physical Exam  BP 134/64 (BP Location: Left Arm, Cuff Size: Normal)   Pulse 63   Ht 5' 7.75" (1.721 m)   Wt 241 lb 12.8 oz (109.7 kg)   SpO2 96%   BMI 37.04 kg/m   GEN: A/Ox3; pleasant , NAD, elderly , obese    HEENT:  [place]/AT,  EACs-clear, TMs-wnl, NOSE-clear, THROAT-clear, no lesions, no postnasal drip or exudate noted.   NECK:  Supple w/ fair ROM; no JVD; normal carotid impulses w/o bruits; no thyromegaly or nodules palpated; no lymphadenopathy.    RESP  Clear  P & A; w/o, wheezes/ rales/ or rhonchi. no accessory muscle use, no dullness to percussion  CARD:  RRR, no m/r/g, tr  peripheral edema, pulses intact, no cyanosis or clubbing.  GI:   Soft & nt; nml bowel sounds; no organomegaly or masses detected.   Musco: Warm bil, no deformities or joint swelling noted.   Neuro: alert, no focal deficits noted.    Skin: Warm, no lesions or rashes    Lab Results:  CBC   Imaging: No results found.    PFT Results Latest Ref Rng & Units 01/19/2018 11/04/2016 02/19/2016  FVC-Pre L 2.42 2.64 2.06  FVC-Predicted Pre % 71 78 61  FVC-Post L 2.43 2.80 -  FVC-Predicted Post % 71 83 -  Pre FEV1/FVC % % 77 78 77  Post  FEV1/FCV % % 81 80 -  FEV1-Pre L 1.87 2.06 1.59  FEV1-Predicted Pre % 73 81 62  FEV1-Post L 1.95 2.25 -  DLCO UNC% % 70 71 82  DLCO COR %Predicted % 88 87 109  TLC L 5.26 - 5.31  TLC % Predicted % 95 - 96  RV % Predicted % 114 - 117    Lab Results  Component Value Date   NITRICOXIDE 15 03/19/2017        Assessment & Plan:   Upper airway cough syndrome Chronic cough since 2009, workup unrevealing ,. Improvement on trigger prevention and cough control rx  Will need to monitor closely as on Amiodarone.   Plan  Patient Instructions   Mucinex Twice daily As needed  Only if you get thick mucus /congestion .  Delsym 2 tsp Twice daily  For cough As needed   Tessalon Three times a day  For cough As needed   Continue on Allegra 1105m daily in am .  Decrease  Chlorpheniramine 423m1 At bedtime  Saline nasal rinses Twice daily As needed   Voice rest , sips of water if cough returns .  NO MINTS .  Call about oral appliance for your Sleep apnea. If does not work we need to look at CPAP again.  Follow up in 3-4 months and As needed   Please contact office for sooner follow up if symptoms do not improve or worsen or seek emergency care         OSA (obstructive sleep apnea) Intolerant to CPAP  Oral appliance referral      TaRexene [place]NP 01/20/2018

## 2018-01-20 NOTE — Assessment & Plan Note (Signed)
Intolerant to CPAP  Oral appliance referral

## 2018-01-26 DIAGNOSIS — G4733 Obstructive sleep apnea (adult) (pediatric): Secondary | ICD-10-CM

## 2018-01-26 NOTE — Telephone Encounter (Signed)
Patient seen 1.9.2020 and instructed to call the office if she decided to move forward with oral appliance referral E-mail from patient stating that she would like a referral to Dr Ron Parker in [place] or [place]  Referral placed  Per 1.9.2020 visit with TP: Patient Instructions   Mucinex Twice daily As needed  Only if you get thick mucus /congestion .  Delsym 2 tsp Twice daily  For cough As needed   Tessalon Three times a day  For cough As needed   Continue on Allegra 180mg  daily in am .  Decrease  Chlorpheniramine 4mg  1 At bedtime  Saline nasal rinses Twice daily As needed   Voice rest , sips of water if cough returns .  NO MINTS .  Call about oral appliance for your Sleep apnea. If does not work we need to look at CPAP again.  Follow up in 3-4 months and As needed   Please contact office for sooner follow up if symptoms do not improve or worsen or seek emergency care

## 2018-01-30 DIAGNOSIS — R053 Chronic cough: Secondary | ICD-10-CM

## 2018-01-30 DIAGNOSIS — R05 Cough: Secondary | ICD-10-CM

## 2018-01-31 NOTE — Telephone Encounter (Signed)
[name] [name],   Patient sent a message to you requesting a nontuberculous mycobacteria (NTM). She cam e across this when she was reading into chronic cough.  Message sent -  I would like to be tested for nontuberculous mycobacteia (NTM) I feel that I have nothing to lose except to rule it out.This cough gets worse when I try to stop some of the medicine, the cough all ways comes back. I have some of the symptoms of NTMbut not all of them.I know that NTM is hard to diagnosis and I have never been tested for it.As you know I have been reading about chronic cough, and I came across this.I have reach my limits on how much more of this cough I can handle.I know you have been very good and I have so much faith in you, so please do this for me.You have done more for me than anyone else.  [name] [name] P, NP, please advise

## 2018-02-01 NOTE — Telephone Encounter (Signed)
Per TP: attempted to call patient to discuss over the phone, home number automated message said "unable to connect at this time" and there was no option to leave message; other contact number was not a working number.  Seem to remember patient having a hard time producing mucus, we can do a sputum specimen but may need to discuss other testing as well.  Patient can call back or email   [employer]

## 2018-02-02 NOTE — Telephone Encounter (Signed)
Per answering service message, patient returned call on 02/02/2018 at 8:01 am.  CB is [phone].

## 2018-02-02 NOTE — Telephone Encounter (Signed)
ATC I have left message and will call back.

## 2018-02-02 NOTE — Telephone Encounter (Signed)
Spoke with the patient she states she will be able to get sputum up so she will come by and pick up the cups from the lab. She verbalized understanding nothing further needed at this time.

## 2018-02-02 NOTE — Telephone Encounter (Signed)
I will call patient back in open encounter and close this message.

## 2018-02-07 ENCOUNTER — Other Ambulatory Visit: Payer: Medicare Other

## 2018-02-08 ENCOUNTER — Other Ambulatory Visit: Payer: Medicare Other

## 2018-02-08 DIAGNOSIS — R053 Chronic cough: Secondary | ICD-10-CM

## 2018-02-08 DIAGNOSIS — R05 Cough: Secondary | ICD-10-CM

## 2018-02-09 LAB — RESPIRATORY CULTURE OR RESPIRATORY AND SPUTUM CULTURE: MICRO NUMBER: 108482

## 2018-02-12 IMAGING — DX DG CHEST 2V
2 series · 2 of 2 positions shown · non-contrast
Comparison: 02/17/2016, 01/17/2016 and earlier.

CLINICAL DATA: Four week history of cough. Current history of sleep
apnea, GE reflux disease, atrial fibrillation and hypertension.
Personal history of bronchitis.

EXAM:
CHEST  2 VIEW

[dg chest 2 view (1 of 2)]
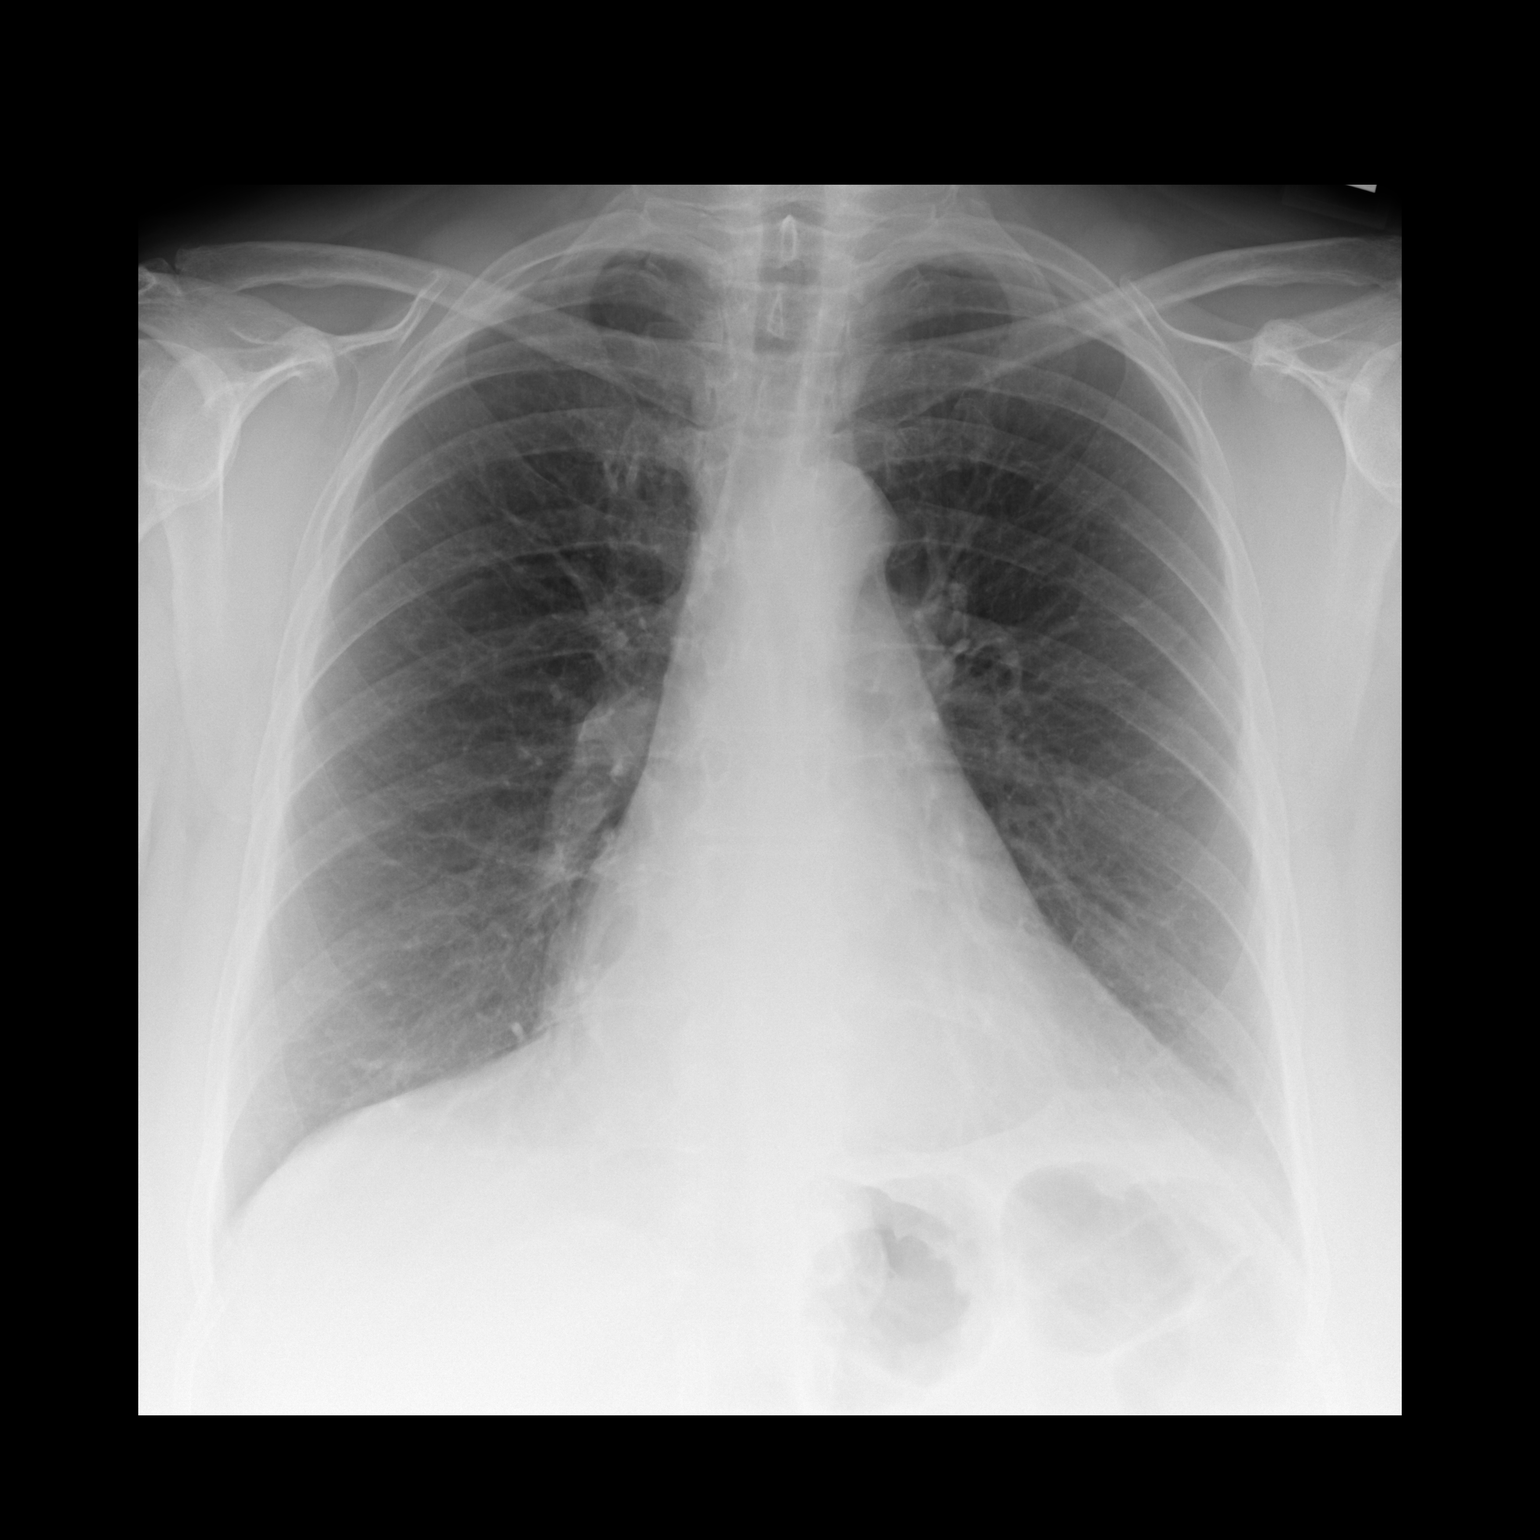

[dg chest 2 view (2 of 2)]
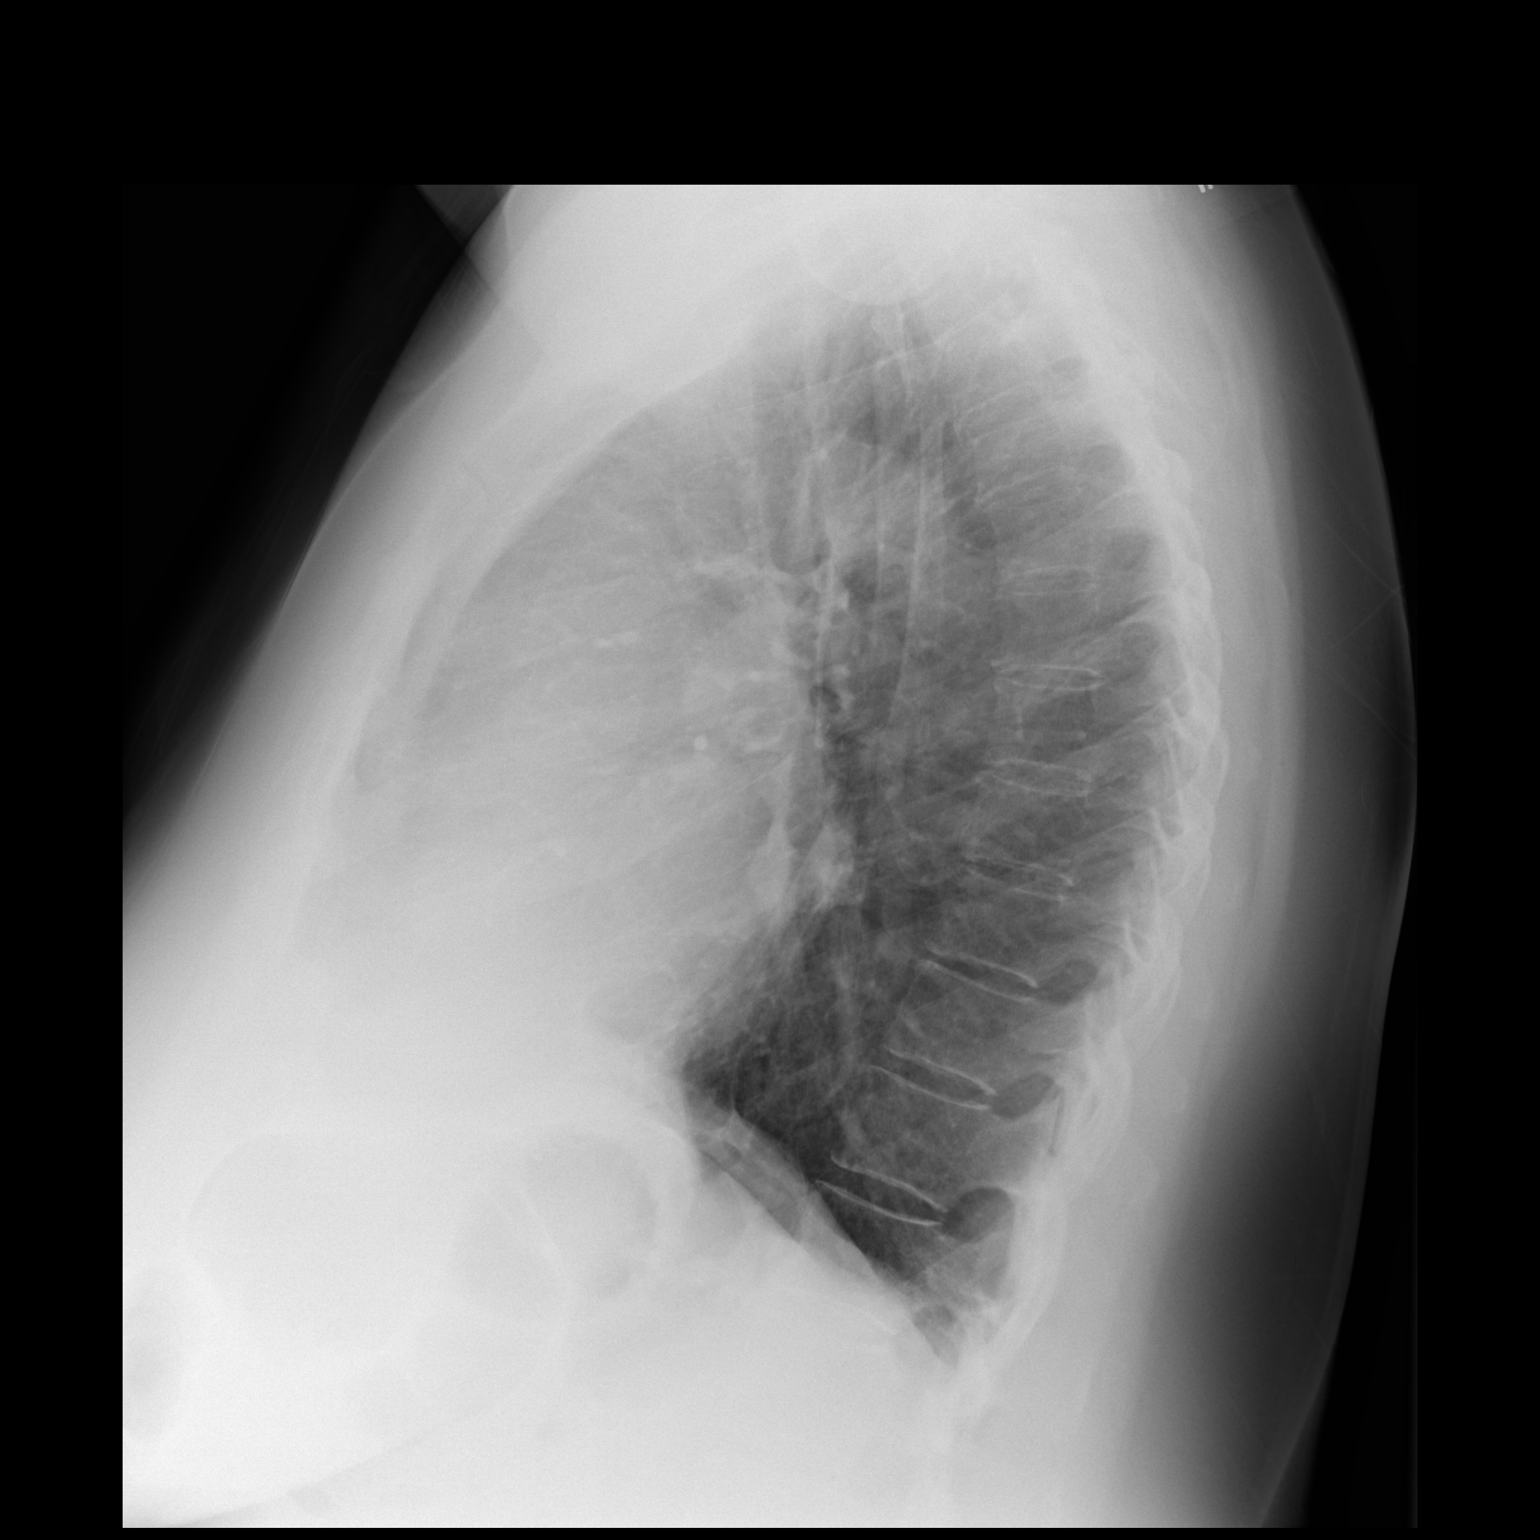

[2 of 2 positions shown; findings below may reference images not displayed]

FINDINGS: Cardiac silhouette mildly enlarged, unchanged. Hilar and mediastinal
contours otherwise unremarkable. Stable minimal to mild scarring in
the left lower lobe. Lungs otherwise clear. Bronchovascular markings
normal. Pulmonary vascularity normal. No pleural effusions.
Visualized bony thorax intact.
IMPRESSION: Stable mild cardiomegaly and stable scarring in the left lower lobe.
No acute cardiopulmonary disease.

## 2018-02-14 NOTE — Progress Notes (Signed)
ATC pt at home number, line busy. Left message on cell number TCB x1.

## 2018-02-15 ENCOUNTER — Other Ambulatory Visit: Payer: Self-pay | Admitting: Family Medicine

## 2018-02-15 DIAGNOSIS — M8580 Other specified disorders of bone density and structure, unspecified site: Secondary | ICD-10-CM

## 2018-02-18 ENCOUNTER — Ambulatory Visit
Admission: RE | Admit: 2018-02-18 | Discharge: 2018-02-18 | Disposition: A | Discharge status: Home / Self Care | Payer: Medicare Other | Source: Ambulatory Visit | Attending: Family Medicine | Admitting: Family Medicine

## 2018-02-18 DIAGNOSIS — M8580 Other specified disorders of bone density and structure, unspecified site: Secondary | ICD-10-CM

## 2018-02-19 ENCOUNTER — Other Ambulatory Visit: Payer: Self-pay | Admitting: Cardiovascular Disease

## 2018-02-21 NOTE — Telephone Encounter (Signed)
Refill Request.  

## 2018-03-21 ENCOUNTER — Other Ambulatory Visit: Payer: Medicare Other | Admitting: *Deleted

## 2018-03-21 DIAGNOSIS — R899 Unspecified abnormal finding in specimens from other organs, systems and tissues: Secondary | ICD-10-CM

## 2018-03-21 LAB — TSH: TSH: 11.7 u[IU]/mL — ABNORMAL HIGH (ref 0.450–4.500)

## 2018-03-21 LAB — T4, FREE: Free T4: 1.11 ng/dL (ref 0.82–1.77)

## 2018-03-22 ENCOUNTER — Telehealth: Payer: Self-pay

## 2018-03-22 DIAGNOSIS — E039 Hypothyroidism, unspecified: Secondary | ICD-10-CM

## 2018-03-22 DIAGNOSIS — R899 Unspecified abnormal finding in specimens from other organs, systems and tissues: Secondary | ICD-10-CM

## 2018-03-22 NOTE — Telephone Encounter (Signed)
Notes recorded by Frederik Schmidt, RN on 03/22/2018 at 2:46 PM EDT The patient has been notified of the result and verbalized understanding. All questions (if any) were answered. Frederik Schmidt, RN 03/22/2018 2:46 PM

## 2018-03-22 NOTE — Telephone Encounter (Signed)
-----   Message from Nelva Bush, MD sent at 03/22/2018 12:18 PM EDT ----- Please let Ms. Bovard know that her TSH has risen further, though free T4 is normal.  I recommend that we refer her to endocrinology for further evaluation and to determine if amiodarone needs to be stopped.

## 2018-03-22 NOTE — Telephone Encounter (Signed)
-----   Message from Nelva Bush, MD sent at 03/22/2018 12:18 PM EDT ----- Please let Ms. Cassatt know that her TSH has risen further, though free T4 is normal.  I recommend that we refer her to endocrinology for further evaluation and to determine if amiodarone needs to be stopped.

## 2018-03-22 NOTE — Telephone Encounter (Signed)
Notes recorded by Frederik Schmidt, RN on 03/22/2018 at 1:28 PM EDT [id number] 3/10 ------

## 2018-03-30 LAB — AFB CULTURE WITH SMEAR (NOT AT ARMC)
Acid Fast Culture: NEGATIVE
Acid Fast Smear: NEGATIVE

## 2018-03-31 ENCOUNTER — Telehealth: Payer: Self-pay | Admitting: Adult Health

## 2018-03-31 ENCOUNTER — Telehealth: Payer: Self-pay

## 2018-03-31 NOTE — Telephone Encounter (Signed)
rec'd email from pt regarding wanted to know why we called her We LVM for pt to return call regarding results Advised pt to call our office regarding results Nothing further needed

## 2018-03-31 NOTE — Telephone Encounter (Signed)
I spoke to the patient who said that she has an appt with Endocrinology on 3/23.  She was thankful for the call.

## 2018-03-31 NOTE — Telephone Encounter (Signed)
Called patient, unable to reach and unable to leave VM.

## 2018-04-01 ENCOUNTER — Telehealth: Payer: Self-pay | Admitting: Adult Health

## 2018-04-01 NOTE — Telephone Encounter (Signed)
ATC Patient x's 3. Phone busy. Unable to leave message.

## 2018-04-01 NOTE — Telephone Encounter (Signed)
There was a mychart message open in regards to same thing and message was able to be taken care of. Nothing further needed.

## 2018-04-04 ENCOUNTER — Ambulatory Visit (INDEPENDENT_AMBULATORY_CARE_PROVIDER_SITE_OTHER): Payer: Medicare Other | Admitting: Endocrinology

## 2018-04-04 ENCOUNTER — Other Ambulatory Visit: Payer: Self-pay

## 2018-04-04 ENCOUNTER — Encounter: Payer: Self-pay | Admitting: Endocrinology

## 2018-04-04 DIAGNOSIS — E032 Hypothyroidism due to medicaments and other exogenous substances: Secondary | ICD-10-CM

## 2018-04-04 DIAGNOSIS — E039 Hypothyroidism, unspecified: Secondary | ICD-10-CM | POA: Insufficient documentation

## 2018-04-04 HISTORY — DX: Hypothyroidism, unspecified: E03.9

## 2018-04-04 MED ORDER — LEVOTHYROXINE SODIUM 50 MCG PO TABS: 50.0000 ug | ORAL_TABLET | Freq: Every day | ORAL | 3 refills | Status: DC

## 2018-04-04 NOTE — Progress Notes (Signed)
Subjective:    Patient ID: [name] [name], female    DOB: [birth year], 73 y.o.   MRN: [id number]  HPI Pt is referred by Dr End, for hypothyroidism.  Pt reports she has been on amiodarone since 2018.  hypothyroidism was dx'ed in late 2019.  she has never been on prescribed thyroid hormone therapy.  she has never taken kelp or any other type of non-prescribed thyroid product.  she has never had thyroid imaging.  She has never had thyroid surgery, or XRT to the neck.  He has never been on lithium. She reports slight tremor of the hands, and assoc cold intolerance.    Past Medical History:  Diagnosis Date  . Anxiety   . Arthritis   . Cataracts, bilateral    immature  . CKD (chronic kidney disease) stage 3, GFR 30-59 ml/min (HCC)   . Depression   . GERD (gastroesophageal reflux disease)    was on Nexium   . GERD (gastroesophageal reflux disease)   . Hemorrhoid   . History of bronchitis    couple of yrs ago  . History of colon polyps   . History of peristent atrial fibrillation   . Hyperlipidemia    takes Pravastatin daily  . Hypertension    takes Amlodipine and Diovan daily  . Hypertension   . Pre-diabetes   . Sleep apnea    no cpap used  . Sleep apnea    study done 20+yrs ago and doesn't use cpap  . Urinary urgency   . Weakness    in left leg r/t back    Past Surgical History:  Procedure Laterality Date  . ABDOMINAL HYSTERECTOMY  1980   partial  . ABDOMINAL HYSTERECTOMY     partial  . BACK SURGERY    . CARDIOVERSION N/A 02/24/2016   Procedure: CARDIOVERSION;  Surgeon: Fay Records, MD;  Location[facility] ENDOSCOPY;  Service: Cardiovascular;  Laterality: N/A;  . CARDIOVERSION N/A 03/19/2016   Procedure: CARDIOVERSION;  Surgeon: Fay Records, MD;  Location: [place];  Service: Cardiovascular;  Laterality: N/A;  . CARPAL TUNNEL RELEASE    . CHOLECYSTECTOMY  1975  . CHOLECYSTECTOMY    . COLONOSCOPY    . COLONOSCOPY WITH PROPOFOL  12/29/2011   Procedure: COLONOSCOPY WITH  PROPOFOL;  Surgeon: Lear Ng, MD;  Location: [facility] ENDOSCOPY;  Service: Endoscopy;  Laterality: N/A;  . COLONOSCOPY WITH PROPOFOL N/A 05/24/2014   Procedure: COLONOSCOPY WITH PROPOFOL;  Surgeon: Wilford Corner, MD;  Location: [facility] ENDOSCOPY;  Service: Endoscopy;  Laterality: N/A;  . ESOPHAGEAL MANOMETRY N/A 02/22/2017   Procedure: ESOPHAGEAL MANOMETRY (EM);  Surgeon: Wilford Corner, MD;  Location: [facility] ENDOSCOPY;  Service: Endoscopy;  Laterality: N/A;  . ESOPHAGOGASTRODUODENOSCOPY    . FOOT SURGERY     left, right  . FRACTURE SURGERY     left leg-knee  . HEEL SPUR EXCISION Bilateral   . HOT HEMOSTASIS  12/29/2011   Procedure: HOT HEMOSTASIS (ARGON PLASMA COAGULATION/BICAP);  Surgeon: Lear Ng, MD;  Location: [facility];  Service: Endoscopy;  Laterality: N/A;  . HOT HEMOSTASIS N/A 05/24/2014   Procedure: HOT HEMOSTASIS (ARGON PLASMA COAGULATION/BICAP);  Surgeon: Wilford Corner, MD;  Location: [facility];  Service: Endoscopy;  Laterality: N/A;  . leg surgery d/t break Left   . LUMBAR LAMINECTOMY/DECOMPRESSION MICRODISCECTOMY Left 04/05/2013   Procedure: LUMBAR FOUR TO FIVE LUMBAR LAMINECTOMY/DECOMPRESSION MICRODISCECTOMY 1 LEVEL;  Surgeon: Eustace Moore, MD;  Location: [place] NEURO ORS;  Service: Neurosurgery;  Laterality: Left;  .  NISSEN FUNDOPLICATION    . [place] IMPEDANCE STUDY N/A 02/22/2017   Procedure: [place] IMPEDANCE STUDY;  Surgeon: Wilford Corner, MD;  Location: [facility] ENDOSCOPY;  Service: Endoscopy;  Laterality: N/A;  . TEE WITHOUT CARDIOVERSION N/A 02/24/2016   Procedure: TRANSESOPHAGEAL ECHOCARDIOGRAM (TEE);  Surgeon: Fay Records, MD;  Location: [place];  Service: Cardiovascular;  Laterality: N/A;  . TOTAL KNEE ARTHROPLASTY Left 10/07/2016  . TOTAL KNEE ARTHROPLASTY Left 10/07/2016   Procedure: TOTAL KNEE ARTHROPLASTY;  Surgeon: [name], MD;  Location: [place];  Service: Orthopedics;  Laterality: Left;    Social History   Socioeconomic History  . Marital  status: Married    Spouse name: Dominica Severin  . Number of children: 2  . Years of education: 64  . Highest education level: Some college, no degree  Occupational History  . Not on file  Social Needs  . Financial resource strain: Not very hard  . Food insecurity:    Worry: Patient refused    Inability: Patient refused  . Transportation needs:    Medical: Patient refused    Non-medical: Patient refused  Tobacco Use  . Smoking status: Never Smoker  . Smokeless tobacco: Never Used  Substance and Sexual Activity  . Alcohol use: No  . Drug use: No  . Sexual activity: Yes    Birth control/protection: Surgical  Lifestyle  . Physical activity:    Days per week: Patient refused    Minutes per session: Patient refused  . Stress: Very much  Relationships  . Social connections:    Talks on phone: Twice a week    Gets together: Twice a week    Attends religious service: More than 4 times per year    Active member of club or organization: No    Attends meetings of clubs or organizations: Never    Relationship status: Married  . Intimate partner violence:    Fear of current or ex partner: No    Emotionally abused: No    Physically abused: No    Forced sexual activity: No  Other Topics Concern  . Not on file  Social History Narrative   ** Merged History Encounter **        Current Outpatient Medications on File Prior to Visit  Medication Sig Dispense Refill  . amiodarone (PACERONE) 200 MG tablet TAKE 1 TABLET BY MOUTH ONCE DAILY 90 tablet 3  . amLODipine (NORVASC) 5 MG tablet Take 1 tablet (5 mg total) by mouth daily. 90 tablet 3  . Ascorbic Acid (VITAMIN C WITH ROSE HIPS) 1000 MG tablet Take 1,000 mg by mouth daily.    Marland Kitchen azelastine (ASTELIN) 0.1 % nasal spray Place 1 spray into both nostrils 2 (two) times daily. Use in each nostril as directed    . benzonatate (TESSALON) 200 MG capsule TAKE 1 CAPSULE BY MOUTH THREE TIMES DAILY AS NEEDED FOR COUGH 60 capsule 3  . buPROPion (WELLBUTRIN  XL) 300 MG 24 hr tablet Take 1 tablet (300 mg total) by mouth daily. 90 tablet 0  . CALCIUM PO Take 1 tablet by mouth every morning.    . chlorpheniramine (CHLOR-TRIMETON) 4 MG tablet Take 4 mg by mouth at bedtime.    . Cholecalciferol (VITAMIN D) 2000 UNITS tablet Take 4,000 Units by mouth daily.    . [employer] MELATONIN PO Take 1 tablet by mouth at bedtime as needed.    Marland Kitchen ELIQUIS 5 MG TABS tablet TAKE 1 TABLET BY MOUTH TWICE DAILY 180 tablet 0  .  fexofenadine (ALLEGRA) 180 MG tablet Take 180 mg by mouth daily as needed for allergies or rhinitis.    . furosemide (LASIX) 20 MG tablet TAKE 1 TABLET BY MOUTH ONCE DAILY 90 tablet 2  . metoprolol succinate (TOPROL-XL) 25 MG 24 hr tablet TAKE 1/2 (ONE-HALF) TABLET BY MOUTH ONCE DAILY 30 tablet 9  . potassium chloride SA (K-DUR,KLOR-CON) 20 MEQ tablet TAKE 1 TABLET BY MOUTH ONCE DAILY 90 tablet 1  . pravastatin (PRAVACHOL) 40 MG tablet Take 40 mg by mouth at bedtime.     No current facility-administered medications on file prior to visit.     Allergies  Allergen Reactions  . Lexapro [Escitalopram]     Makes her feel funny  . Peroxide [Hydrogen Peroxide] Other (See Comments)    Redness.   [place] [Sertraline Hcl]     Makes her feel funny  . Neosporin [Neomycin-Bacitracin Zn-Polymyx] Rash    Blisters, itching.     Family History  Problem Relation Age of Onset  . Colon cancer Mother   . Heart disease Father   . Breast cancer Neg Hx   . Thyroid disease Neg Hx     BP (!) 142/84 (BP Location: Right Arm, Patient Position: Sitting, Cuff Size: Normal)   Pulse (!) 57   Temp 97.6 F (36.4 C)   Ht 5\' 7"  (1.702 m)   Wt 245 lb (111.1 kg)   SpO2 97%   BMI 38.37 kg/m    Review of Systems denies muscle cramps, memory loss, numbness, blurry vision, myalgias, rhinorrhea, easy bruising, and syncope.  She has fatigue, hair loss, dry skin, constipation, doe, and weight gain.  Depression is well-controlled.       Objective:   Physical Exam VS: see  vs page GEN: no distress HEAD: head: no deformity eyes: no periorbital swelling, no proptosis external nose and ears are normal mouth: no lesion seen NECK: supple, thyroid is not enlarged CHEST WALL: no deformity LUNGS: clear to auscultation CV: reg rate and rhythm, no murmur ABD: abdomen is soft, nontender.  no hepatosplenomegaly.  not distended.  no hernia MUSCULOSKELETAL: muscle bulk and strength are grossly normal.  no obvious joint swelling.  gait is normal and steady EXTEMITIES: no deformity.  no edema PULSES: no carotid bruit NEURO:  cn 2-12 grossly intact.   readily moves all 4's.  sensation is intact to touch on all 4's.  Slight tremor of the hands.  SKIN:  Normal texture and temperature.  No rash or suspicious lesion is visible.   NODES:  None palpable at the neck PSYCH: alert, well-oriented.  Does not appear anxious nor depressed.     Lab Results  Component Value Date   TSH 11.700 (H) 03/21/2018   Chest CT (2019): no mention I made of the thyroid    Assessment & Plan:  Hypothyroidism, new to me: due to amiodarone, in a person who should be presumed to be susceptible. HTN: is noted today AF: she can continue amiodarone  Patient Instructions  Your blood pressure is high today.  Please see your primary care provider soon, to have it rechecked I have sent a prescription to your pharmacy, for a thyroid hormone pill. Please redo the blood test in approx 1 month, here in the office.  This is not a reason to stop the amiodarone--we'll work around that.   Please come back for a follow-up appointment in 3 months.

## 2018-04-04 NOTE — Patient Instructions (Addendum)
Your blood pressure is high today.  Please see your primary care provider soon, to have it rechecked I have sent a prescription to your pharmacy, for a thyroid hormone pill. Please redo the blood test in approx 1 month, here in the office.  This is not a reason to stop the amiodarone--we'll work around that.   Please come back for a follow-up appointment in 3 months.

## 2018-04-07 NOTE — Telephone Encounter (Signed)
Patient with recent weight gain, edema, and increasing shortness of breath.  TSH noted to be elevated with levothyroxine recently added by Dr. Loanne Drilling.  I have recommended increasing furosemide to 40 mg daily x 3 days to see if this improves her symptoms.  She should contact us by MyChart or phone early next week to update [place] on her symptoms.  (Recommendations sent to patient via MyChart).  Nelva Bush, MD [facility] Pager: [phone]

## 2018-04-07 NOTE — Telephone Encounter (Signed)
ATC pt, line rang busy x2. Will try back. 

## 2018-04-08 NOTE — Telephone Encounter (Signed)
Attempted to call x3 but each time received a busy tone and unable to leave a VM. Will try to call back later.

## 2018-04-11 NOTE — Telephone Encounter (Signed)
Call made to patient, she states she does not know who called or what it was in reference to. No recent telephone encounter, labs, imaging, or appt. Made aware nothing was needed at this time. Voiced understanding.

## 2018-04-16 ENCOUNTER — Other Ambulatory Visit (HOSPITAL_COMMUNITY): Payer: Self-pay | Admitting: Internal Medicine

## 2018-04-25 ENCOUNTER — Ambulatory Visit: Payer: Medicare Other | Admitting: Adult Health

## 2018-05-02 ENCOUNTER — Other Ambulatory Visit: Payer: Self-pay

## 2018-05-02 ENCOUNTER — Telehealth (INDEPENDENT_AMBULATORY_CARE_PROVIDER_SITE_OTHER): Payer: Medicare Other | Admitting: Cardiology

## 2018-05-02 ENCOUNTER — Encounter: Payer: Self-pay | Admitting: Cardiology

## 2018-05-02 VITALS — BP 138/70 | HR 61 | Ht 67.0 in | Wt 244.0 lb

## 2018-05-02 DIAGNOSIS — R0600 Dyspnea, unspecified: Secondary | ICD-10-CM

## 2018-05-02 DIAGNOSIS — R0609 Other forms of dyspnea: Secondary | ICD-10-CM

## 2018-05-02 DIAGNOSIS — I10 Essential (primary) hypertension: Secondary | ICD-10-CM

## 2018-05-02 DIAGNOSIS — I4819 Other persistent atrial fibrillation: Secondary | ICD-10-CM

## 2018-05-02 NOTE — Progress Notes (Signed)
Virtual Visit via Video Note   This visit type was conducted due to national recommendations for restrictions regarding the COVID-19 Pandemic (e.g. social distancing) in an effort to limit this patient's exposure and mitigate transmission in our community.  Due to her co-morbid illnesses, this patient is at least at moderate risk for complications without adequate follow up.  This format is felt to be most appropriate for this patient at this time.  All issues noted in this document were discussed and addressed.  A limited physical exam was performed with this format.  Please refer to the patient's chart for her consent to telehealth for [facility].   Evaluation Performed:  Follow-up visit  Date:  05/02/2018   ID:  [name] [name], [name] [name] [birth year], MRN [id number]  Patient Location: Home Provider Location: Home  PCP:  Harlan Stains, MD  Cardiologist:  Candee Furbish, MD former Dr. END Electrophysiologist:  None   Chief Complaint: Atrial fibrillation follow-up  History of Present Illness:    [name] [name] is a 73 y.o. female with paroxysmal atrial fibrillation here for follow-up.  She is on antiarrhythmic therapy with amiodarone as well as anticoagulation with Eliquis.  Has been followed by pulmonary medicine as well with her amiodarone.    The patient does not have symptoms concerning for COVID-19 infection (fever, chills, cough, or new shortness of breath).    Past Medical History:  Diagnosis Date  . Anxiety   . Arthritis   . Cataracts, bilateral    immature  . CKD (chronic kidney disease) stage 3, GFR 30-59 ml/min (HCC)   . Depression   . GERD (gastroesophageal reflux disease)    was on Nexium   . GERD (gastroesophageal reflux disease)   . Hemorrhoid   . History of bronchitis    couple of yrs ago  . History of colon polyps   . History of peristent atrial fibrillation   . Hyperlipidemia    takes Pravastatin daily  . Hypertension    takes Amlodipine and Diovan daily  .  Hypertension   . Pre-diabetes   . Sleep apnea    no cpap used  . Sleep apnea    study done 20+yrs ago and doesn't use cpap  . Urinary urgency   . Weakness    in left leg r/t back   Past Surgical History:  Procedure Laterality Date  . ABDOMINAL HYSTERECTOMY  1980   partial  . ABDOMINAL HYSTERECTOMY     partial  . BACK SURGERY    . CARDIOVERSION N/A 02/24/2016   Procedure: CARDIOVERSION;  Surgeon: Fay Records, MD;  Location: [facility] ENDOSCOPY;  Service: Cardiovascular;  Laterality: N/A;  . CARDIOVERSION N/A 03/19/2016   Procedure: CARDIOVERSION;  Surgeon: Fay Records, MD;  Location: [place];  Service: Cardiovascular;  Laterality: N/A;  . CARPAL TUNNEL RELEASE    . CHOLECYSTECTOMY  1975  . CHOLECYSTECTOMY    . COLONOSCOPY    . COLONOSCOPY WITH PROPOFOL  12/29/2011   Procedure: COLONOSCOPY WITH PROPOFOL;  Surgeon: Lear Ng, MD;  [facility] ENDOSCOPY;  Service: Endoscopy;  Laterality: N/A;  . COLONOSCOPY WITH PROPOFOL N/A 05/24/2014   Procedure: COLONOSCOPY WITH PROPOFOL;  Surgeon: Wilford Corner, MD;  [facility] ENDOSCOPY;  Service: Endoscopy;  Laterality: N/A;  . ESOPHAGEAL MANOMETRY N/A 02/22/2017   Procedure: ESOPHAGEAL MANOMETRY (EM);  Surgeon: Wilford Corner, MD;  [facility] ENDOSCOPY;  Service: Endoscopy;  Laterality: N/A;  . ESOPHAGOGASTRODUODENOSCOPY    . FOOT SURGERY  left, right  . FRACTURE SURGERY     left leg-knee  . HEEL SPUR EXCISION Bilateral   . HOT HEMOSTASIS  12/29/2011   Procedure: HOT HEMOSTASIS (ARGON PLASMA COAGULATION/BICAP);  Surgeon: Lear Ng, MD;  Location: [facility];  Service: Endoscopy;  Laterality: N/A;  . HOT HEMOSTASIS N/A 05/24/2014   Procedure: HOT HEMOSTASIS (ARGON PLASMA COAGULATION/BICAP);  Surgeon: Wilford Corner, MD;  Location: [facility];  Service: Endoscopy;  Laterality: N/A;  . leg surgery d/t break Left   . LUMBAR LAMINECTOMY/DECOMPRESSION MICRODISCECTOMY Left 04/05/2013   Procedure: LUMBAR FOUR TO  FIVE LUMBAR LAMINECTOMY/DECOMPRESSION MICRODISCECTOMY 1 LEVEL;  Surgeon: Eustace Moore, MD;  Location: [place] NEURO ORS;  Service: Neurosurgery;  Laterality: Left;  . NISSEN FUNDOPLICATION    . [place] IMPEDANCE STUDY N/A 02/22/2017   Procedure: [place] IMPEDANCE STUDY;  Surgeon: Wilford Corner, MD;  [facility] ENDOSCOPY;  Service: Endoscopy;  Laterality: N/A;  . TEE WITHOUT CARDIOVERSION N/A 02/24/2016   Procedure: TRANSESOPHAGEAL ECHOCARDIOGRAM (TEE);  Surgeon: Fay Records, MD;  Location: [place];  Service: Cardiovascular;  Laterality: N/A;  . TOTAL KNEE ARTHROPLASTY Left 10/07/2016  . TOTAL KNEE ARTHROPLASTY Left 10/07/2016   Procedure: TOTAL KNEE ARTHROPLASTY;  Surgeon: [name], MD;  Location: [place];  Service: Orthopedics;  Laterality: Left;     Current Meds  Medication Sig  . amiodarone (PACERONE) 200 MG tablet TAKE 1 TABLET BY MOUTH ONCE DAILY  . amLODipine (NORVASC) 5 MG tablet Take 1 tablet (5 mg total) by mouth daily.  Marland Kitchen amoxicillin-clavulanate (AUGMENTIN) 875-125 MG tablet Take 1 tablet by mouth 2 (two) times daily.  . Ascorbic Acid (VITAMIN C WITH ROSE HIPS) 1000 MG tablet Take 1,000 mg by mouth daily.  . benzonatate (TESSALON) 200 MG capsule TAKE 1 CAPSULE BY MOUTH THREE TIMES DAILY AS NEEDED FOR COUGH  . buPROPion (WELLBUTRIN XL) 300 MG 24 hr tablet Take 1 tablet (300 mg total) by mouth daily.  Marland Kitchen CALCIUM PO Take 1 tablet by mouth every morning.  . chlorpheniramine (CHLOR-TRIMETON) 4 MG tablet Take 4 mg by mouth at bedtime.  . Cholecalciferol (VITAMIN D) 2000 UNITS tablet Take 4,000 Units by mouth daily.  . CVS MELATONIN PO Take 1 tablet by mouth at bedtime as needed.  Marland Kitchen ELIQUIS 5 MG TABS tablet TAKE 1 TABLET BY MOUTH TWICE DAILY  . fexofenadine (ALLEGRA) 180 MG tablet Take 180 mg by mouth daily as needed for allergies or rhinitis.  . furosemide (LASIX) 20 MG tablet TAKE 1 TABLET BY MOUTH ONCE DAILY  . levothyroxine (SYNTHROID, LEVOTHROID) 50 MCG tablet Take 1 tablet (50 mcg  total) by mouth daily before breakfast.  . metoprolol succinate (TOPROL-XL) 25 MG 24 hr tablet TAKE 1/2 (ONE-HALF) TABLET BY MOUTH ONCE DAILY  . potassium chloride SA (K-DUR,KLOR-CON) 20 MEQ tablet Take 1 tablet by mouth once daily  . pravastatin (PRAVACHOL) 40 MG tablet Take 40 mg by mouth at bedtime.     Allergies:   Lexapro [escitalopram]; Peroxide [hydrogen peroxide]; Zoloft [sertraline hcl]; and Neosporin [neomycin-bacitracin zn-polymyx]   Social History   Tobacco Use  . Smoking status: Never Smoker  . Smokeless tobacco: Never Used  Substance Use Topics  . Alcohol use: No  . Drug use: No     Family Hx: The patient's family history includes Colon cancer in her mother; Heart disease in her father. There is no history of Breast cancer or Thyroid disease.  ROS:   Please see the history of present illness.    Longstanding  shortness of breath, cough.  Recent fall.  Mechanical.  No chest pain, no bleeding All other systems reviewed and are negative.   Prior CV studies:   The following studies were reviewed today:  Cardiovascular History & Procedures: Cardiovascular Problems:  Persistent atrial fibrillation  Risk Factors:  Hypertension, hyperlipidemia, and age greater than 56  Cath/PCI:  None  CV Surgery:  None  EP Procedures and Devices:  None  Non-Invasive Evaluation(s):  Pharmacologic MPI (07/07/16): Low risk study without ischemia or scar. LVEF 64%.  TEE (02/24/16): LVEF depressed but difficult to assess. Trace AI. Moderate MR. Large LA without thrombus. Question small PFO. Normal RV size with depressed function. Mild TR.  TTE (02/18/16): Normal LV size and wall thickness with EF 50-55%. Normal wall motion. MAC with mild MR. Moderate LA enlargement. Mildly dilated RV with normal contraction. Mild TR. Mildly elevated PA pressure.  Stress test (15-20 years ago): Normal per patient's report.   Labs/Other Tests and Data Reviewed:    EKG:  An ECG dated  11/01/17 was personally reviewed today and demonstrated:  NSR 70, QTc 480  Recent Labs: 11/15/2017: ALT 32; BUN 14; Creatinine, Ser 1.33; Potassium 4.2; Sodium 143 03/21/2018: TSH 11.700   Recent Lipid Panel Lab Results  Component Value Date/Time   CHOL 125 02/18/2016 04:54 AM   TRIG 90 02/18/2016 04:54 AM   HDL 41 02/18/2016 04:54 AM   CHOLHDL 3.0 02/18/2016 04:54 AM   LDLCALC 66 02/18/2016 04:54 AM    Wt Readings from Last 3 Encounters:  05/02/18 244 lb (110.7 kg)  04/04/18 245 lb (111.1 kg)  01/20/18 241 lb 12.8 oz (109.7 kg)     Objective:    Vital Signs:  BP 138/70   Pulse 61   Ht _0  (1.702 m)   Wt 244 lb (110.7 kg)   BMI 38.22 kg/m    VITAL SIGNS:  reviewed GEN:  no acute distress EYES:  Right-sided periorbital ecchymosis noted RESPIRATORY:  normal respiratory effort, symmetric expansion CARDIOVASCULAR:  no peripheral edema SKIN:  Periorbital ecchymosis MUSCULOSKELETAL:  no obvious deformities. NEURO:  alert and oriented x 3, no obvious focal deficit PSYCH:  normal affect  ASSESSMENT & PLAN:    Paroxysmal atrial fibrillation - Amiodarone, Eliquis.  Pulmonary has been following for amiodarone.  Has been maintaining sinus rhythm.  EF is normal.  Chronic anticoagulation -Eliquis.  She did recently have a fall in the parking lot at [employer].  Mechanical.  She did bite through her lip.  She was evaluated.  Has right periorbital ecchymosis but no broken bones.  No evidence of brain injury.  Dyspnea on exertion - Likely multifactorial, longstanding.  Continue encouragement of activity.  Also has a longstanding mild cough.  She sees pulmonary.  Essential hypertension - There was talk previously about starting back losartan, angiotensin receptor blocker but she has not used this medication.  She had had valsartan in the past but this was discontinued when she was hypotensive in the setting of A. fib with RVR.  Blood work has been checked.  She is also on very low-dose  Toprol.  Overall her blood pressures are fairly reasonable when checked at home.  We went through some values.  Essential tremor -I did mention her that we could potentially swap the metoprolol for propranolol, nonselective beta-blocker which can sometimes help with essential tremor.  At this time she would like to hold off on any medication changes.  COVID-19 Education: The signs and symptoms of COVID-19 were discussed  with the patient and how to seek care for testing (follow up with PCP or arrange E-visit).  The importance of social distancing was discussed today.  Time:   Today, I have spent 15 minutes with the patient with telehealth technology discussing the above problems.     Medication Adjustments/Labs and Tests Ordered: Current medicines are reviewed at length with the patient today.  Concerns regarding medicines are outlined above.   Tests Ordered: No orders of the defined types were placed in this encounter.   Medication Changes: No orders of the defined types were placed in this encounter.   Disposition:  Follow up in 6 month(s)  Signed, Candee Furbish, MD  05/02/2018 10:11 AM    [facility]

## 2018-05-02 NOTE — Patient Instructions (Signed)
Medication Instructions:  The current medical regimen is effective;  continue present plan and medications.  If you need a refill on your cardiac medications before your next appointment, please call your pharmacy.   Follow-Up: Follow up in 6 months with Dr. Skains.  You will receive a letter in the mail 2 months before you are due.  Please call us when you receive this letter to schedule your follow up appointment.   Thank you for choosing [facility]!!       

## 2018-05-06 ENCOUNTER — Other Ambulatory Visit (INDEPENDENT_AMBULATORY_CARE_PROVIDER_SITE_OTHER): Payer: Medicare Other

## 2018-05-06 ENCOUNTER — Other Ambulatory Visit: Payer: Self-pay | Admitting: Endocrinology

## 2018-05-06 DIAGNOSIS — E032 Hypothyroidism due to medicaments and other exogenous substances: Secondary | ICD-10-CM | POA: Diagnosis not present

## 2018-05-06 LAB — T4, FREE: Free T4: 1.07 ng/dL (ref 0.60–1.60)

## 2018-05-06 LAB — TSH: TSH: 4.92 u[IU]/mL — ABNORMAL HIGH (ref 0.35–4.50)

## 2018-05-06 LAB — T3, FREE: T3, Free: 2.8 pg/mL (ref 2.3–4.2)

## 2018-05-06 MED ORDER — LEVOTHYROXINE SODIUM 75 MCG PO TABS: 75.0000 ug | ORAL_TABLET | Freq: Every day | ORAL | 11 refills | Status: DC

## 2018-05-20 ENCOUNTER — Other Ambulatory Visit: Payer: Self-pay | Admitting: Cardiovascular Disease

## 2018-05-23 NOTE — Telephone Encounter (Signed)
Please review for refill.  

## 2018-05-23 NOTE — Telephone Encounter (Signed)
Wt 110.7 kg, age 73, SCr 66.82, CrCl 66.82. Virtual visit w/ Dr. Marlou Porch 05/02/18.

## 2018-05-31 ENCOUNTER — Other Ambulatory Visit: Payer: Self-pay | Admitting: Adult Health

## 2018-06-07 ENCOUNTER — Other Ambulatory Visit: Payer: Self-pay | Admitting: Internal Medicine

## 2018-06-07 MED ORDER — POTASSIUM CHLORIDE CRYS ER 20 MEQ PO TBCR: 20.0000 meq | EXTENDED_RELEASE_TABLET | Freq: Every day | ORAL | 3 refills | Status: DC

## 2018-06-07 NOTE — Telephone Encounter (Signed)
Pt's medication was sent to pt's pharmacy as requested. Confirmation received.  °

## 2018-06-07 NOTE — Telephone Encounter (Signed)
°*  STAT* If patient is at the pharmacy, call can be transferred to refill team.   1. Which medications need to be refilled? (please list name of each medication and dose if known) potasium 20 MEQ  2. Which pharmacy/location (including street and city if local pharmacy) is medication to be sent to? [facility] Fax [phone]  3. Do they need a 30 day or 90 day supply? 90 not sure pharmacy calling.

## 2018-06-20 NOTE — Telephone Encounter (Signed)
Please have Dr. Dema Severin, PCP, address. Thanks Candee Furbish, MD

## 2018-06-29 IMAGING — CT CT CHEST HIGH RESOLUTION W/O CM
2 of 6 series · 15 of 36 positions shown, 18 images · non-contrast
Comparison: 11/09/2016 chest radiograph.

CLINICAL DATA: Chronic cough.  Nonsmoker.

EXAM:
CT CHEST WITHOUT CONTRAST
TECHNIQUE: Multidetector CT imaging of the chest was performed following the
standard protocol without intravenous contrast. High resolution
imaging of the lungs, as well as inspiratory and expiratory imaging,
was performed.

[Series 2: high resolution · axial · 0.62mm/px · z∈[-332,-60]mm · 12 of 153 slices shown, 15 images]
[im 9/153  mediastinal]
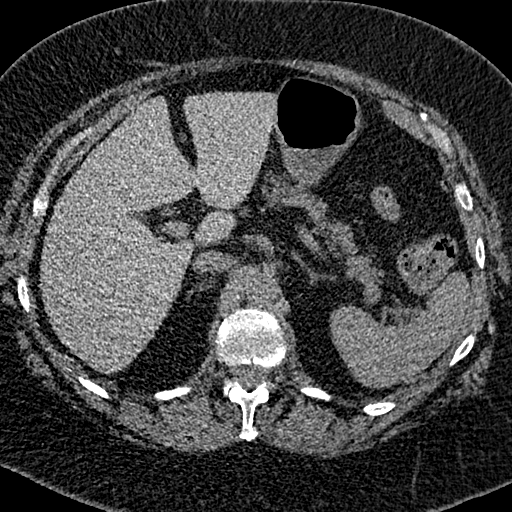
[im 9/153  lung]
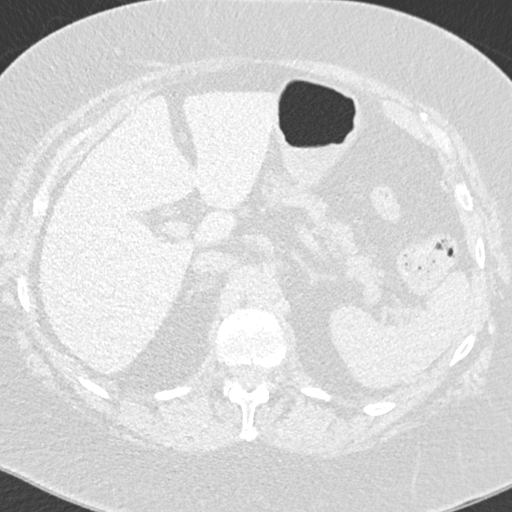
[im 25/153  lung]
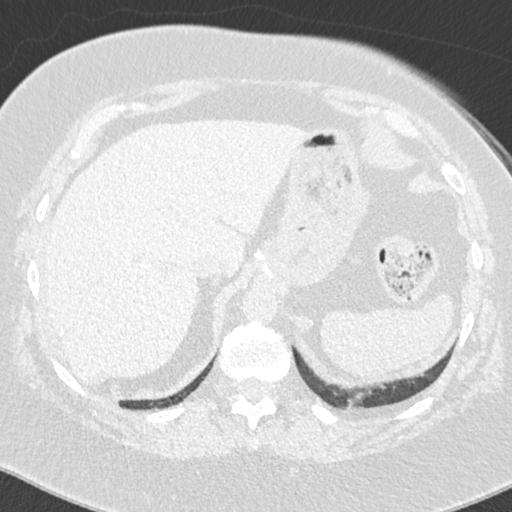
[im 33/153  lung]
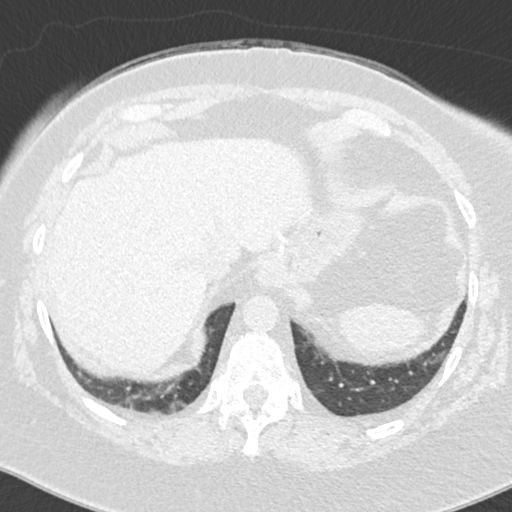
[im 49/153  lung]
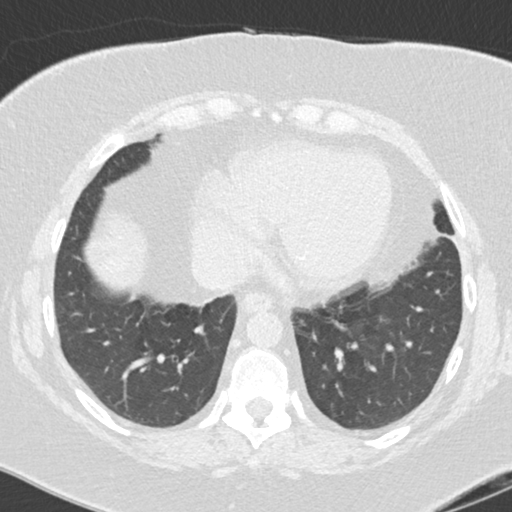
[im 57/153  mediastinal]
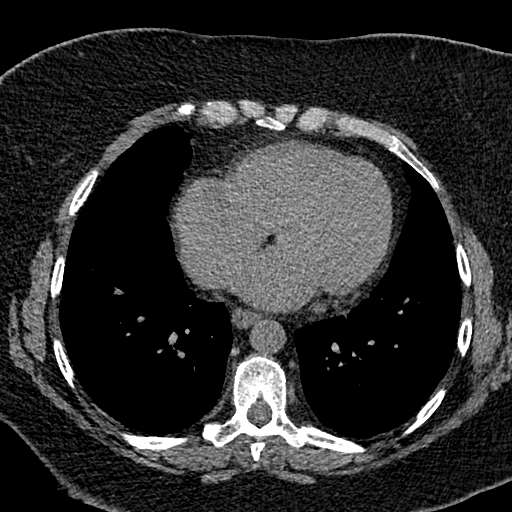
[im 57/153  lung]
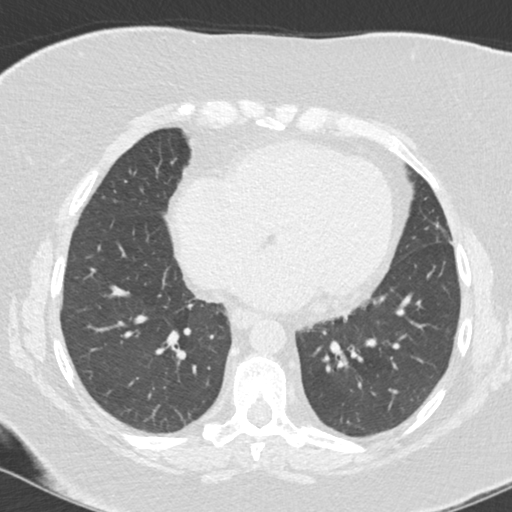
[im 73/153  lung]
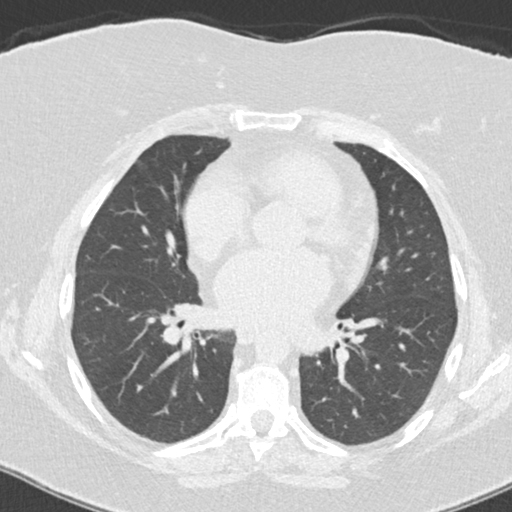
[im 81/153  lung]
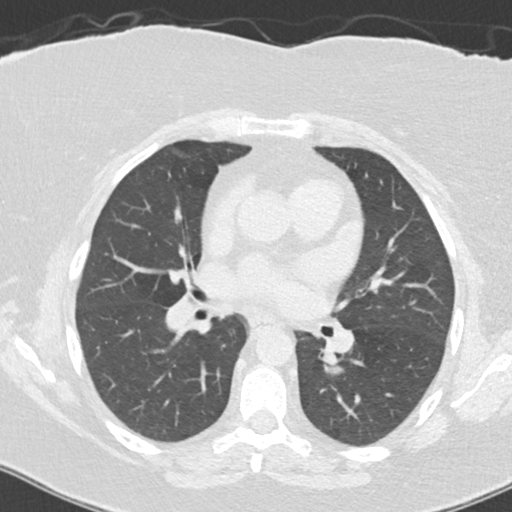
[im 97/153  lung]
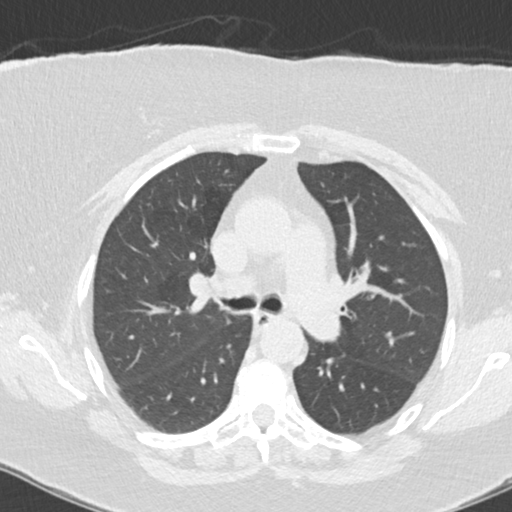
[im 105/153  mediastinal]
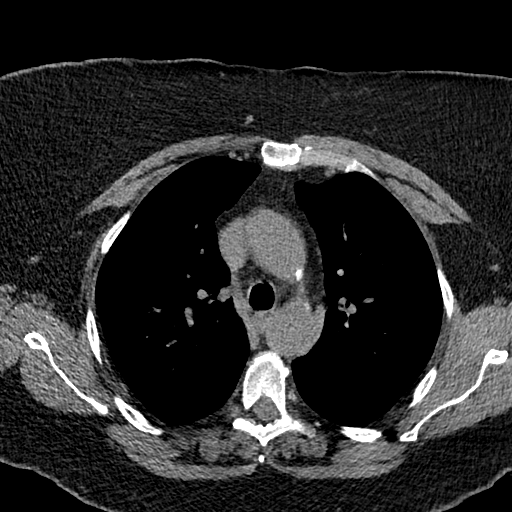
[im 105/153  lung]
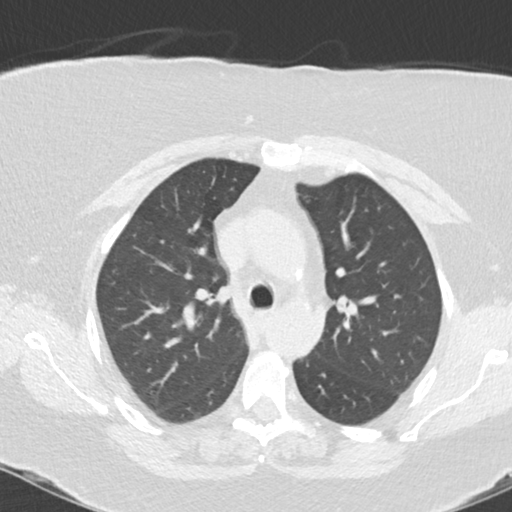
[im 121/153  lung]
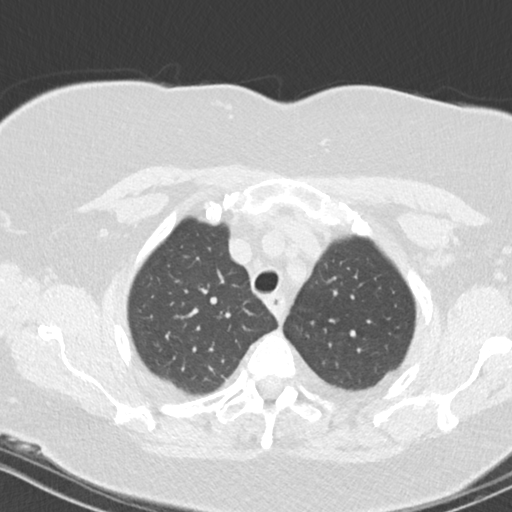
[im 129/153  lung]
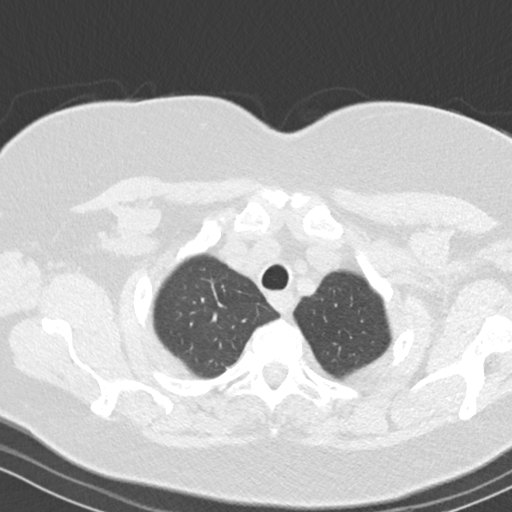
[im 145/153  lung]
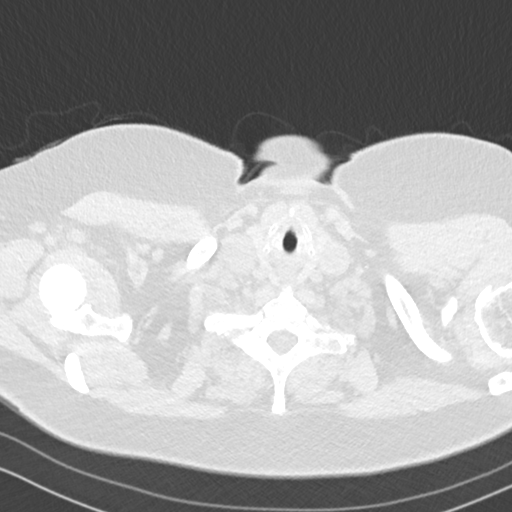

[Series 8: coronal · coronal · 0.59mm/px · 3 of 127 slices shown]
[im 26/127  lung]
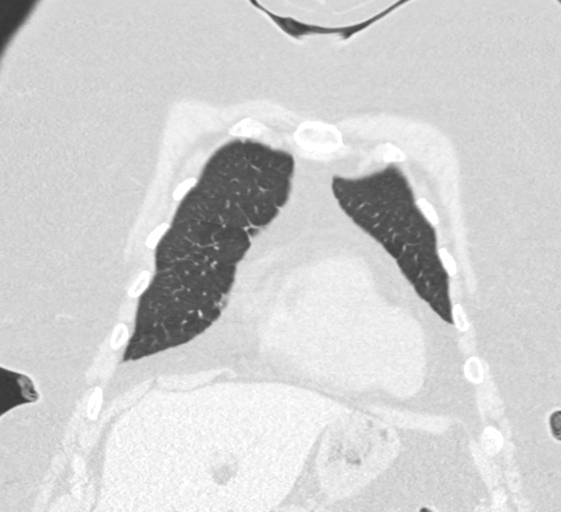
[im 51/127  lung]
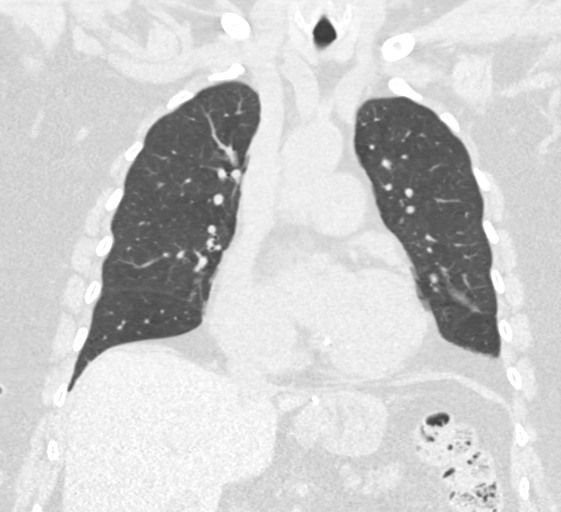
[im 76/127  lung]
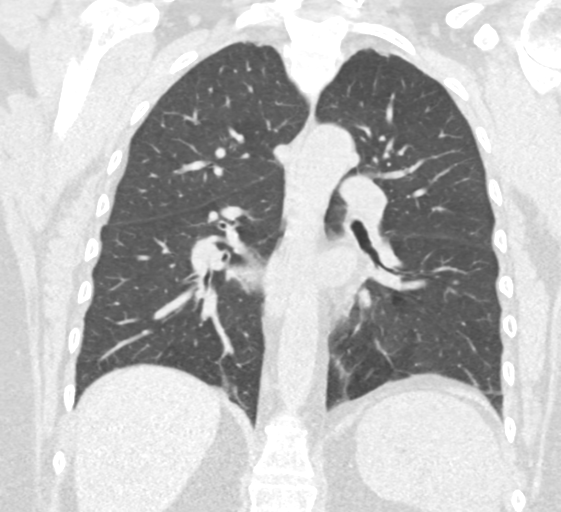

[15 of 36 positions shown; findings below may reference images not displayed]

FINDINGS: Cardiovascular: Normal heart size. No significant pericardial
fluid/thickening. Left anterior descending and left circumflex
coronary atherosclerosis. Mildly atherosclerotic nonaneurysmal
thoracic aorta. Normal caliber pulmonary arteries.

Mediastinum/Nodes: No discrete thyroid nodules. Unremarkable
esophagus. No pathologically enlarged axillary, mediastinal or gross
hilar lymph nodes, noting limited sensitivity for the detection of
hilar adenopathy on this noncontrast study.

Lungs/Pleura: No pneumothorax. No pleural effusion. No acute
consolidative airspace disease or lung masses. Subpleural solid 3 mm
anterior right lower lobe pulmonary nodule (series 3/image 66). No
additional significant pulmonary nodules. Mild patchy air trapping
in both lungs on the expiration sequence. No significant regions of
subpleural reticulation, ground-glass attenuation, traction
bronchiectasis, architectural distortion or frank honeycombing. A
few thin scattered parenchymal bands in the mid to lower lungs
bilaterally.

Upper abdomen: Postsurgical changes are noted from Nissen
fundoplication.

Musculoskeletal: No aggressive appearing focal osseous lesions. Mild
thoracic spondylosis.
IMPRESSION: 1. No evidence of interstitial lung disease.
2. Mild patchy air trapping in both lungs, indicative of small
airways disease.
3. Thin parenchymal bands in the mid to lower lungs, compatible with
mild postinfectious/postinflammatory scarring.
4. Solitary subpleural 3 mm solid right lower lobe pulmonary nodule,
probably benign. No follow-up needed if patient is low-risk.
Non-contrast chest CT can be considered in 12 months if patient is
high-risk. This recommendation follows the consensus statement:
Guidelines for Management of Incidental Pulmonary Nodules Detected
on CT Images:From the [HOSPITAL] 3713; published online
before print (10.1148/[id number]).
5. Two vessel coronary atherosclerosis.

Aortic Atherosclerosis (HBNEW-ISK.K).

## 2018-07-07 ENCOUNTER — Other Ambulatory Visit: Payer: Self-pay

## 2018-07-11 ENCOUNTER — Ambulatory Visit (INDEPENDENT_AMBULATORY_CARE_PROVIDER_SITE_OTHER): Payer: Medicare Other | Admitting: Endocrinology

## 2018-07-11 ENCOUNTER — Encounter: Payer: Self-pay | Admitting: Endocrinology

## 2018-07-11 ENCOUNTER — Other Ambulatory Visit: Payer: Self-pay

## 2018-07-11 VITALS — BP 144/72 | HR 73 | Ht 67.0 in | Wt 247.6 lb

## 2018-07-11 DIAGNOSIS — E032 Hypothyroidism due to medicaments and other exogenous substances: Secondary | ICD-10-CM | POA: Diagnosis not present

## 2018-07-11 DIAGNOSIS — S60221A Contusion of right hand, initial encounter: Secondary | ICD-10-CM

## 2018-07-11 DIAGNOSIS — I10 Essential (primary) hypertension: Secondary | ICD-10-CM

## 2018-07-11 LAB — T4, FREE: Free T4: 1.03 ng/dL (ref 0.60–1.60)

## 2018-07-11 LAB — TSH: TSH: 2.13 u[IU]/mL (ref 0.35–4.50)

## 2018-07-11 NOTE — Patient Instructions (Addendum)
Your blood pressure is high today.  Please see your primary care provider soon, to have it rechecked Blood tests are requested for you today.  We'll let you know about the results.  Please see Dr Dema Severin if your hand does not feel better soon.   Please come back for a follow-up appointment in 6 months.

## 2018-07-11 NOTE — Progress Notes (Signed)
Subjective:    Patient ID: [name] [name], female    DOB: 03-02-1945, 73 y.o.   MRN: [id number]  HPI Pt returns for f/u of hypothyroidism (she has been on amiodarone since 2018; hypothyroidism was dx'ed in late 2019, when she was rx'ed synthroid).  Pt fell 3 days ago.  Since then, she has moderate pain at the right hand, and assoc swelling.   Past Medical History:  Diagnosis Date  . Anxiety   . Arthritis   . Cataracts, bilateral    immature  . CKD (chronic kidney disease) stage 3, GFR 30-59 ml/min (HCC)   . Depression   . GERD (gastroesophageal reflux disease)    was on Nexium   . GERD (gastroesophageal reflux disease)   . Hemorrhoid   . History of bronchitis    couple of yrs ago  . History of colon polyps   . History of peristent atrial fibrillation   . Hyperlipidemia    takes Pravastatin daily  . Hypertension    takes Amlodipine and Diovan daily  . Hypertension   . Pre-diabetes   . Sleep apnea    no cpap used  . Sleep apnea    study done 20+yrs ago and doesn't use cpap  . Urinary urgency   . Weakness    in left leg r/t back    Past Surgical History:  Procedure Laterality Date  . ABDOMINAL HYSTERECTOMY  1980   partial  . ABDOMINAL HYSTERECTOMY     partial  . BACK SURGERY    . CARDIOVERSION N/A 02/24/2016   Procedure: CARDIOVERSION;  Surgeon: Fay Records, MD;  Location: [facility] ENDOSCOPY;  Service: Cardiovascular;  Laterality: N/A;  . CARDIOVERSION N/A 03/19/2016   Procedure: CARDIOVERSION;  Surgeon: Fay Records, MD;  Location: [place];  Service: Cardiovascular;  Laterality: N/A;  . CARPAL TUNNEL RELEASE    . CHOLECYSTECTOMY  1975  . CHOLECYSTECTOMY    . COLONOSCOPY    . COLONOSCOPY WITH PROPOFOL  12/29/2011   Procedure: COLONOSCOPY WITH PROPOFOL;  Surgeon: Lear Ng, MD;  [facility] ENDOSCOPY;  Service: Endoscopy;  Laterality: N/A;  . COLONOSCOPY WITH PROPOFOL N/A 05/24/2014   Procedure: COLONOSCOPY WITH PROPOFOL;  Surgeon: Wilford Corner, MD;   [facility] ENDOSCOPY;  Service: Endoscopy;  Laterality: N/A;  . ESOPHAGEAL MANOMETRY N/A 02/22/2017   Procedure: ESOPHAGEAL MANOMETRY (EM);  Surgeon: Wilford Corner, MD;  [facility] ENDOSCOPY;  Service: Endoscopy;  Laterality: N/A;  . ESOPHAGOGASTRODUODENOSCOPY    . FOOT SURGERY     left, right  . FRACTURE SURGERY     left leg-knee  . HEEL SPUR EXCISION Bilateral   . HOT HEMOSTASIS  12/29/2011   Procedure: HOT HEMOSTASIS (ARGON PLASMA COAGULATION/BICAP);  Surgeon: Lear Ng, MD;  Location: [facility]OS[facility];  Service: Endoscopy;  Laterality: N/A;  . HOT HEMOSTASIS N/A 05/24/2014   Procedure: HOT HEMOSTASIS (ARGON PLASMA COAGULATION/BICAP);  Surgeon: Wilford Corner, MD;  Location: [facility]OS[facility];  Service: Endoscopy;  Laterality: N/A;  . leg surgery d/t break Left   . LUMBAR LAMINECTOMY/DECOMPRESSION MICRODISCECTOMY Left 04/05/2013   Procedure: LUMBAR FOUR TO FIVE LUMBAR LAMINECTOMY/DECOMPRESSION MICRODISCECTOMY 1 LEVEL;  Surgeon: Eustace Moore, MD;  Location: [place] NEURO ORS;  Service: Neurosurgery;  Laterality: Left;  . NISSEN FUNDOPLICATION    . [place] IMPEDANCE STUDY N/A 02/22/2017   Procedure: [place] IMPEDANCE STUDY;  Surgeon: Wilford Corner, MD;  [facility] ENDOSCOPY;  Service: Endoscopy;  Laterality: N/A;  . TEE WITHOUT CARDIOVERSION N/A 02/24/2016   Procedure: TRANSESOPHAGEAL  ECHOCARDIOGRAM (TEE);  Surgeon: Fay Records, MD;  Location: [place];  Service: Cardiovascular;  Laterality: N/A;  . TOTAL KNEE ARTHROPLASTY Left 10/07/2016  . TOTAL KNEE ARTHROPLASTY Left 10/07/2016   Procedure: TOTAL KNEE ARTHROPLASTY;  Surgeon: [name], MD;  Location: [place];  Service: Orthopedics;  Laterality: Left;    Social History   Socioeconomic History  . Marital status: Married    Spouse name: Dominica Severin  . Number of children: 2  . Years of education: 30  . Highest education level: Some college, no degree  Occupational History  . Not on file  Social Needs  . Financial resource strain:  Not very hard  . Food insecurity    Worry: Patient refused    Inability: Patient refused  . Transportation needs    Medical: Patient refused    Non-medical: Patient refused  Tobacco Use  . Smoking status: Never Smoker  . Smokeless tobacco: Never Used  Substance and Sexual Activity  . Alcohol use: No  . Drug use: No  . Sexual activity: Yes    Birth control/protection: Surgical  Lifestyle  . Physical activity    Days per week: Patient refused    Minutes per session: Patient refused  . Stress: Very much  Relationships  . Social Herbalist on phone: Twice a week    Gets together: Twice a week    Attends religious service: More than 4 times per year    Active member of club or organization: No    Attends meetings of clubs or organizations: Never    Relationship status: Married  . Intimate partner violence    Fear of current or ex partner: No    Emotionally abused: No    Physically abused: No    Forced sexual activity: No  Other Topics Concern  . Not on file  Social History Narrative   ** Merged History Encounter **        Current Outpatient Medications on File Prior to Visit  Medication Sig Dispense Refill  . amiodarone (PACERONE) 200 MG tablet TAKE 1 TABLET BY MOUTH ONCE DAILY 90 tablet 3  . amLODipine (NORVASC) 5 MG tablet Take 1 tablet (5 mg total) by mouth daily. 90 tablet 3  . amoxicillin-clavulanate (AUGMENTIN) 875-125 MG tablet Take 1 tablet by mouth 2 (two) times daily.    . Ascorbic Acid (VITAMIN C WITH ROSE HIPS) 1000 MG tablet Take 1,000 mg by mouth daily.    . benzonatate (TESSALON) 200 MG capsule Take 1 capsule by mouth three times daily as needed for cough 60 capsule 0  . buPROPion (WELLBUTRIN XL) 300 MG 24 hr tablet Take 1 tablet (300 mg total) by mouth daily. 90 tablet 0  . CALCIUM PO Take 1 tablet by mouth every morning.    . chlorpheniramine (CHLOR-TRIMETON) 4 MG tablet Take 4 mg by mouth at bedtime.    . Cholecalciferol (VITAMIN D) 2000 UNITS  tablet Take 4,000 Units by mouth daily.    . CVS MELATONIN PO Take 1 tablet by mouth at bedtime as needed.    Marland Kitchen ELIQUIS 5 MG TABS tablet Take 1 tablet by mouth twice daily 180 tablet 0  . fexofenadine (ALLEGRA) 180 MG tablet Take 180 mg by mouth daily as needed for allergies or rhinitis.    . furosemide (LASIX) 20 MG tablet TAKE 1 TABLET BY MOUTH ONCE DAILY 90 tablet 2  . levothyroxine (SYNTHROID) 75 MCG tablet Take 1 tablet (75 mcg total) by mouth  daily before breakfast. 30 tablet 11  . metoprolol succinate (TOPROL-XL) 25 MG 24 hr tablet TAKE 1/2 (ONE-HALF) TABLET BY MOUTH ONCE DAILY 30 tablet 9  . potassium chloride SA (K-DUR) 20 MEQ tablet Take 1 tablet (20 mEq total) by mouth daily. 90 tablet 3  . pravastatin (PRAVACHOL) 40 MG tablet Take 40 mg by mouth at bedtime.     No current facility-administered medications on file prior to visit.     Allergies  Allergen Reactions  . Lexapro [Escitalopram]     Makes her feel funny  . Peroxide [Hydrogen Peroxide] Other (See Comments)    Redness.   [place] [Sertraline Hcl]     Makes her feel funny  . Neosporin [Neomycin-Bacitracin Zn-Polymyx] Rash    Blisters, itching.     Family History  Problem Relation Age of Onset  . Colon cancer Mother   . Heart disease Father   . Breast cancer Neg Hx   . Thyroid disease Neg Hx     BP (!) 144/72 (BP Location: Left Arm, Patient Position: Sitting, Cuff Size: Large)   Pulse 73   Ht 5\' 7"  (1.702 m)   Wt 247 lb 9.6 oz (112.3 kg)   SpO2 95%   BMI 38.78 kg/m    Review of Systems She has slight bilat ankle swelling.  No weight change.     Objective:   Physical Exam VITAL SIGNS:  See vs page GENERAL: no distress NECK: There is no palpable thyroid enlargement.  No thyroid nodule is palpable.  No palpable lymphadenopathy at the anterior neck. Right hand: moderate swelling and slight tenderness.   Lab Results  Component Value Date   TSH 2.13 07/11/2018      Assessment & Plan:  Hand  contusion, new: she declines x-ray.  Hypothyroidism: well-replaced.  Please continue the same medication.   HTN: is noted today.  Patient Instructions  Your blood pressure is high today.  Please see your primary care provider soon, to have it rechecked Blood tests are requested for you today.  We'll let you know about the results.  Please see Dr Dema Severin if your hand does not feel better soon.   Please come back for a follow-up appointment in 6 months.

## 2018-07-22 ENCOUNTER — Other Ambulatory Visit: Payer: Self-pay

## 2018-07-22 ENCOUNTER — Ambulatory Visit (INDEPENDENT_AMBULATORY_CARE_PROVIDER_SITE_OTHER): Payer: Medicare Other | Admitting: Adult Health

## 2018-07-22 ENCOUNTER — Encounter: Payer: Self-pay | Admitting: Adult Health

## 2018-07-22 DIAGNOSIS — R05 Cough: Secondary | ICD-10-CM | POA: Diagnosis not present

## 2018-07-22 DIAGNOSIS — R059 Cough, unspecified: Secondary | ICD-10-CM

## 2018-07-22 DIAGNOSIS — G4733 Obstructive sleep apnea (adult) (pediatric): Secondary | ICD-10-CM | POA: Diagnosis not present

## 2018-07-22 DIAGNOSIS — J310 Chronic rhinitis: Secondary | ICD-10-CM

## 2018-07-22 MED ORDER — ALBUTEROL SULFATE HFA 108 (90 BASE) MCG/ACT IN AERS: 1.0000 | INHALATION_SPRAY | RESPIRATORY_TRACT | 1 refills | Status: DC | PRN

## 2018-07-22 NOTE — Assessment & Plan Note (Signed)
Declines CPAP , intolerant  Unable to afford oral appliance

## 2018-07-22 NOTE — Progress Notes (Signed)
$'@Patient'e$  ID: [name] [name], female    DOB: [birth year], 73 y.o.   MRN: [id number]  Chief Complaint  Patient presents with   Follow-up    Cough     Referring provider: Harlan Stains, MD  HPI: 73 year old female never smoker followed for chronic cough present since 2009 (worse since 2016) Medical history significant for A. fib on amiodarone (started February 2019) OSA -CPAP intolerant   TEST/EVENTS :  Chronic cough hx Neg CXR  ENT evaluation 2019 neg per pt  GI w/up neg for GERD per pt  Basement but does not use No hot tub use( has one but not functioning )  No chickens, birds, occupatonal /hobby exposure.  NoAutoimmune dz, no macrodantin use. On amiodarone 02/2017 PFT October 2018 showed FEV1 at 88%, ratio 80, FVC 83%, no significant bronchodilator response,mid flow reversibility, DLCO 71% CT chest March 26, 2017>negative for ILD, patchy air trapping in both lungs, thin parenchymal bands in the mid to lower lungs compatible with a mild postinfectious or postinflammatory scarring, 3 mm right lower lobe nodule. ESR March 2019 normal March 2019 allergy profile and IgE and CBC with differential normal  07/22/2018 Follow up: Chronic Cough  Patient returns for a 86-monthfollow-up.  Patient is followed for chronic cough that is been present since 2009.  She had an extensive work-up that is been essentially unrevealing.  High-resolution CT chest March 2019 showed no evidence of interstitial lung disease.  Allergy profile, IgE and CBC and ESR were normal. Patient is on amiodarone which was started in February 2019.  Pulmonary function testing 1.2020  showed mild to moderate restriction with an FEV1 at 76%, ratio 81, FVC 71%, no significant bronchodilator response, DLCO 70% this was similar to PFTs in 2018 Since last visit patient says she is doing well .   Says her cough is 95% better and has been doing well for >6 months , occasionally has a cough . Uses delsym and mucinex on  occasion.  Has some intermittent wheezing on occasion that she can hear from her throat .   Patient does have obstructive sleep apnea.  Has been intolerant to CPAP in the past. Referred for oral appliance could not afford .   Fell few weeks ago, broke right wrist . Stepped in a hole and fell.    Allergies  Allergen Reactions   Lexapro [Escitalopram]     Makes her feel funny   Peroxide [Hydrogen Peroxide] Other (See Comments)    Redness.    Zoloft [Sertraline Hcl]     Makes her feel funny   Neosporin [Neomycin-Bacitracin Zn-Polymyx] Rash    Blisters, itching.     Immunization History  Administered Date(s) Administered   Influenza, High Dose Seasonal PF 10/09/2016, 10/12/2017    Past Medical History:  Diagnosis Date   Anxiety    Arthritis    Cataracts, bilateral    immature   CKD (chronic kidney disease) stage 3, GFR 30-59 ml/min (HCC)    Depression    GERD (gastroesophageal reflux disease)    was on Nexium    GERD (gastroesophageal reflux disease)    Hemorrhoid    History of bronchitis    couple of yrs ago   History of colon polyps    History of peristent atrial fibrillation    Hyperlipidemia    takes Pravastatin daily   Hypertension    takes Amlodipine and Diovan daily   Hypertension    Pre-diabetes    Sleep apnea    no  cpap used   Sleep apnea    study done 20+yrs ago and doesn't use cpap   Urinary urgency    Weakness    in left leg r/t back    Tobacco History: Social History   Tobacco Use  Smoking Status Never Smoker  Smokeless Tobacco Never Used   Counseling given: Not Answered   Outpatient Medications Prior to Visit  Medication Sig Dispense Refill   amiodarone (PACERONE) 200 MG tablet TAKE 1 TABLET BY MOUTH ONCE DAILY 90 tablet 3   amLODipine (NORVASC) 5 MG tablet Take 1 tablet (5 mg total) by mouth daily. 90 tablet 3   Ascorbic Acid (VITAMIN C WITH ROSE HIPS) 1000 MG tablet Take 1,000 mg by mouth daily.      benzonatate (TESSALON) 200 MG capsule Take 1 capsule by mouth three times daily as needed for cough 60 capsule 0   buPROPion (WELLBUTRIN XL) 300 MG 24 hr tablet Take 1 tablet (300 mg total) by mouth daily. 90 tablet 0   CALCIUM PO Take 1 tablet by mouth every morning.     chlorpheniramine (CHLOR-TRIMETON) 4 MG tablet Take 4 mg by mouth at bedtime.     Cholecalciferol (VITAMIN D) 2000 UNITS tablet Take 4,000 Units by mouth daily.     [facility] MELATONIN PO Take 1 tablet by mouth at bedtime as needed.     ELIQUIS 5 MG TABS tablet Take 1 tablet by mouth twice daily 180 tablet 0   fexofenadine (ALLEGRA) 180 MG tablet Take 180 mg by mouth daily as needed for allergies or rhinitis.     furosemide (LASIX) 20 MG tablet TAKE 1 TABLET BY MOUTH ONCE DAILY 90 tablet 2   levothyroxine (SYNTHROID) 75 MCG tablet Take 1 tablet (75 mcg total) by mouth daily before breakfast. 30 tablet 11   metoprolol succinate (TOPROL-XL) 25 MG 24 hr tablet TAKE 1/2 (ONE-HALF) TABLET BY MOUTH ONCE DAILY 30 tablet 9   potassium chloride SA (K-DUR) 20 MEQ tablet Take 1 tablet (20 mEq total) by mouth daily. 90 tablet 3   pravastatin (PRAVACHOL) 40 MG tablet Take 40 mg by mouth at bedtime.     amoxicillin-clavulanate (AUGMENTIN) 875-125 MG tablet Take 1 tablet by mouth 2 (two) times daily.     No facility-administered medications prior to visit.      Review of Systems:   Constitutional:   No  weight loss, night sweats,  Fevers, chills, +fatigue, or  lassitude.  HEENT:   No headaches,  Difficulty swallowing,  Tooth/dental problems, or  Sore throat,                No sneezing, itching, ear ache, nasal congestion, post nasal drip,   CV:  No chest pain,  Orthopnea, PND, swelling in lower extremities, anasarca, dizziness, palpitations, syncope.   GI  No heartburn, indigestion, abdominal pain, nausea, vomiting, diarrhea, change in bowel habits, loss of appetite, bloody stools.   Resp:  ,  No coughing up of blood.  No  change in color of mucus   No chest wall deformity  Skin: no rash or lesions.  GU: no dysuria, change in color of urine, no urgency or frequency.  No flank pain, no hematuria   MS:  No joint pain or swelling.  No decreased range of motion.  No back pain. Right wrist fx +     Physical Exam  BP 134/78 (BP Location: Left Arm, Cuff Size: Large)    Pulse 63    Temp 98 F (  36.7 C) (Oral)    Ht 5' 7"  (1.702 m)    Wt 249 lb 6.4 oz (113.1 kg)    SpO2 97%    BMI 39.06 kg/m   GEN: A/Ox3; pleasant , NAD, obese elderly    HEENT:  [place]/AT,  NOSE-clear, THROAT-clear, no lesions, no postnasal drip or exudate noted.   NECK:  Supple w/ fair ROM; no JVD; normal carotid impulses w/o bruits; no thyromegaly or nodules palpated; no lymphadenopathy.    RESP  Clear  P & A; w/o, wheezes/ rales/ or rhonchi. no accessory muscle use, no dullness to percussion  CARD:  RRR, no m/r/g, tr  peripheral edema, pulses intact, no cyanosis or clubbing.  GI:   Soft & nt; nml bowel sounds; no organomegaly or masses detected.   Musco: Warm bil,  + right wrist splint   Neuro: alert, no focal deficits noted.    Skin: Warm, no lesions or rashes    Lab Results:   ProBNP No results found for: PROBNP  Imaging: No results found.    PFT Results Latest Ref Rng & Units 01/19/2018 11/04/2016 02/19/2016  FVC-Pre L 2.42 2.64 2.06  FVC-Predicted Pre % 71 78 61  FVC-Post L 2.43 2.80 -  FVC-Predicted Post % 71 83 -  Pre FEV1/FVC % % 77 78 77  Post FEV1/FCV % % 81 80 -  FEV1-Pre L 1.87 2.06 1.59  FEV1-Predicted Pre % 73 81 62  FEV1-Post L 1.95 2.25 -  DLCO UNC% % 70 71 82  DLCO COR %Predicted % 88 87 109  TLC L 5.26 - 5.31  TLC % Predicted % 95 - 96  RV % Predicted % 114 - 117    Lab Results  Component Value Date   NITRICOXIDE 15 03/19/2017        Assessment & Plan:   No problem-specific Assessment & Plan notes found for this encounter.     Rexene Edison, NP 07/22/2018

## 2018-07-22 NOTE — Patient Instructions (Addendum)
Mucinex Twice daily As needed  Only if you get thick mucus /congestion .  Delsym 2 tsp Twice daily  For cough As needed   Tessalon Three times a day  For cough As needed   Continue on Allegra 180mg  1/2 to 1  daily in am .  Continue on Chlorpheniramine 4mg  1 At bedtime  Saline nasal rinses Twice daily As needed   Voice rest , sips of water if cough returns .  NO MINTS .  Ventolin Inhaler 2 puffs every 4-6 hrs as needed for wheezing .   Follow up in 6 months with Dr. Melvyn Novas and Parrett NP  And As needed   Please contact office for sooner follow up if symptoms do not improve or worsen or seek emergency care

## 2018-07-22 NOTE — Assessment & Plan Note (Signed)
Chronic cough - improved with treatment aimed at cough control and chronic rhinitis  ?cough variant asthma with intermittent wheezing - could also be psuedowheezing from upper airway irritation from post nasal drainage.  Give a trial of Ventolin to use As needed  , advised to not over use as may aggravate.   Plan  Patient Instructions   Mucinex Twice daily As needed  Only if you get thick mucus /congestion .  Delsym 2 tsp Twice daily  For cough As needed   Tessalon Three times a day  For cough As needed   Continue on Allegra 180mg  1/2 to 1  daily in am .  Continue on Chlorpheniramine 4mg  1 At bedtime  Saline nasal rinses Twice daily As needed   Voice rest , sips of water if cough returns .  NO MINTS .  Ventolin Inhaler 2 puffs every 4-6 hrs as needed for wheezing .   Follow up in 6 months with Dr. Melvyn Novas and Carter Kassel NP  And As needed   Please contact office for sooner follow up if symptoms do not improve or worsen or seek emergency care

## 2018-07-22 NOTE — Assessment & Plan Note (Signed)
Continue on preventive regimen   Plan  Patient Instructions   Mucinex Twice daily As needed  Only if you get thick mucus /congestion .  Delsym 2 tsp Twice daily  For cough As needed   Tessalon Three times a day  For cough As needed   Continue on Allegra 180mg  1/2 to 1  daily in am .  Continue on Chlorpheniramine 4mg  1 At bedtime  Saline nasal rinses Twice daily As needed   Voice rest , sips of water if cough returns .  NO MINTS .  Ventolin Inhaler 2 puffs every 4-6 hrs as needed for wheezing .   Follow up in 6 months with Dr. Melvyn Novas and Tedra Coppernoll NP  And As needed   Please contact office for sooner follow up if symptoms do not improve or worsen or seek emergency care

## 2018-07-30 DIAGNOSIS — G4733 Obstructive sleep apnea (adult) (pediatric): Secondary | ICD-10-CM

## 2018-08-01 NOTE — Telephone Encounter (Signed)
Patient last seen in the office on 7.10.2020 by Parrett NP.  A&P from last visit: OSA (obstructive sleep apnea) - Parrett, [name] S, NP at 07/22/2018  9:49 AM Status: Written  Related Problem: OSA (obstructive sleep apnea)   Declines CPAP , intolerant  Unable to afford oral appliance       [name], Patient e-mailed the office stating that she is ready to begin CPAP therapy and asks if she needs another sleep study >> last done 4.9.2018.    Please advise if another sleep study is in order or if patient may begin CPAP therapy.  Thank you.

## 2018-08-02 NOTE — Telephone Encounter (Signed)
Shouldn't need repeat study   Begin CPAP At bedtime  Auto set 5-15  Mask of choice  follow up in 2 months   Please contact office for sooner follow up if symptoms do not improve or worsen or seek emergency care

## 2018-08-03 ENCOUNTER — Telehealth: Payer: Self-pay | Admitting: Pulmonary Disease

## 2018-08-03 DIAGNOSIS — G4733 Obstructive sleep apnea (adult) (pediatric): Secondary | ICD-10-CM

## 2018-08-03 NOTE — Telephone Encounter (Signed)
Called and spoke with Melissa, [employer].  Melissa stated Patient's last sleep study was 04/2016.  Melissa stated cpap was never started, because of cost to Patient.   Melissa stated for Patient to start cpap she would have to have a new sleep study.  Message routed to [place], NP

## 2018-08-05 NOTE — Telephone Encounter (Signed)
Per Parrett NP: okay for Home Sleep Test.

## 2018-08-05 NOTE — Telephone Encounter (Signed)
Call made to patient, made aware she will be required to repeat her sleep study in order to get new cpap. She is okay with this. Order for home sleep study placed. Aware we will call her to get his scheduled. Nothing further needed at this time.

## 2018-08-16 ENCOUNTER — Other Ambulatory Visit: Payer: Self-pay | Admitting: Internal Medicine

## 2018-08-16 NOTE — Telephone Encounter (Signed)
Refill Request.  

## 2018-08-17 NOTE — Telephone Encounter (Signed)
27f 113.1kg Scr 1.33 11/15/17 lov skains 05/02/18

## 2018-09-05 ENCOUNTER — Other Ambulatory Visit: Payer: Self-pay | Admitting: Adult Health

## 2018-09-05 NOTE — Telephone Encounter (Signed)
Is this appropriate for refill ? 

## 2018-09-13 NOTE — Telephone Encounter (Signed)
I have called and LVM for this to patient on 08/18, 08/26 and 9/1 on the phone # [phone] and the phone # she gave [place] to call her on  [phone] only gives a fast busy signal. Please have patient call me at my direct # [phone]

## 2018-09-13 NOTE — Telephone Encounter (Signed)
PCC's can you see where pt is on your list or give an approximate date on when she will be called to pick up the machine?

## 2018-09-13 NOTE — Telephone Encounter (Signed)
[name] [name] please call pt.

## 2018-09-14 ENCOUNTER — Other Ambulatory Visit: Payer: Self-pay

## 2018-09-14 MED ORDER — BENZONATATE 200 MG PO CAPS: 200.0000 mg | ORAL_CAPSULE | Freq: Three times a day (TID) | ORAL | 0 refills | Status: DC | PRN

## 2018-09-14 NOTE — Telephone Encounter (Signed)
Sent message from [place] to the patient so that the patient could call her directly. Nothing further needed at this time.

## 2018-09-14 NOTE — Telephone Encounter (Signed)
I have tried to call Mrs. [name] [name] again just now and the phone just rings fast busy just like always when I try to call her. The number that I am calling is the [phone]. If she wants this Home Sleep Study scheduled she needs to call my number [phone] that will be the only way we can get it scheduled

## 2018-09-20 NOTE — Telephone Encounter (Signed)
I finally got to speak with Mrs. [name] [name]. She would like to pick up the HST machine in the mornings. She is scheduled to pick up the HST machine on 09/23/18 @ 11:00am. She is aware of the appt and time

## 2018-09-23 ENCOUNTER — Other Ambulatory Visit: Payer: Self-pay

## 2018-09-23 ENCOUNTER — Ambulatory Visit: Payer: Medicare Other

## 2018-09-23 DIAGNOSIS — G4733 Obstructive sleep apnea (adult) (pediatric): Secondary | ICD-10-CM

## 2018-09-24 DIAGNOSIS — G4733 Obstructive sleep apnea (adult) (pediatric): Secondary | ICD-10-CM | POA: Diagnosis not present

## 2018-09-27 DIAGNOSIS — G4733 Obstructive sleep apnea (adult) (pediatric): Secondary | ICD-10-CM | POA: Diagnosis not present

## 2018-10-04 ENCOUNTER — Other Ambulatory Visit: Payer: Self-pay | Admitting: Family Medicine

## 2018-10-04 ENCOUNTER — Telehealth: Payer: Self-pay | Admitting: Adult Health

## 2018-10-04 DIAGNOSIS — Z1231 Encounter for screening mammogram for malignant neoplasm of breast: Secondary | ICD-10-CM

## 2018-10-04 NOTE — Telephone Encounter (Signed)
CY please advise on home sleep test results.  Thanks!

## 2018-10-04 NOTE — Telephone Encounter (Signed)
[name] [name] has called in on my phone wanting her sleep study results. Dr. Annamaria Boots has read the study and it is scanned into Epic. Would someone try to call [name] [name] [phone] and if you can't get her she is active in [place]. You could message her

## 2018-10-04 NOTE — Telephone Encounter (Signed)
Her sleep study showed severe obstructive sleep apnea, averaging 30.5 apneas/ hour, with drops in blood oxygen level. The study was ordered by TP, and she was recently seen for problem of cough by PM.  Suggest I see her as a work in sometime in next 2 weeks to discuss sleep study and options.

## 2018-10-05 NOTE — Telephone Encounter (Signed)
ATC pt, line rang busy. I have sent a MyChart message to pt per Anita's original note below and will keep this encounter open to f/u on so that we can create a 2 week f/u appt for pt.

## 2018-10-06 NOTE — Telephone Encounter (Signed)
Called and spoke w/ pt regarding CY's results and recommendations below. Pt verbalized understanding and agreed to setting up a f/u visit with CY on 10/13/2018 at 11:30 AM EDT. Appt has been scheduled. Nothing further needed at this time.

## 2018-10-10 ENCOUNTER — Other Ambulatory Visit: Payer: Self-pay | Admitting: Internal Medicine

## 2018-10-10 NOTE — Telephone Encounter (Signed)
Please review for refill. Thanks!  

## 2018-10-13 ENCOUNTER — Encounter: Payer: Self-pay | Admitting: Internal Medicine

## 2018-10-13 ENCOUNTER — Other Ambulatory Visit: Payer: Self-pay

## 2018-10-13 ENCOUNTER — Ambulatory Visit: Payer: Medicare Other | Admitting: Internal Medicine

## 2018-10-13 VITALS — BP 140/70 | HR 60 | Temp 98.1°F | Ht 67.0 in | Wt 249.6 lb

## 2018-10-13 DIAGNOSIS — R05 Cough: Secondary | ICD-10-CM | POA: Diagnosis not present

## 2018-10-13 DIAGNOSIS — R058 Other specified cough: Secondary | ICD-10-CM

## 2018-10-13 DIAGNOSIS — G4733 Obstructive sleep apnea (adult) (pediatric): Secondary | ICD-10-CM

## 2018-10-13 DIAGNOSIS — Z23 Encounter for immunization: Secondary | ICD-10-CM | POA: Diagnosis not present

## 2018-10-13 MED ORDER — BEVESPI AEROSPHERE 9-4.8 MCG/ACT IN AERO: 2.0000 | INHALATION_SPRAY | Freq: Two times a day (BID) | RESPIRATORY_TRACT | 0 refills | Status: DC

## 2018-10-13 MED ORDER — AEROCHAMBER MV MISC: 0 refills | Status: DC

## 2018-10-13 NOTE — Patient Instructions (Signed)
Order- new DME, new CPAP auto 5-15, mask of choice, humidifier, supplies, AirView/ card  Order- flu vax senior  Sample Bevespi inhaler      Inhale 2 puffs through spacer, twice daily  Order- Aerochamber spacer tube for use with inhalers

## 2018-10-13 NOTE — Progress Notes (Signed)
10/13/2018- 73 yo F never smoker for sleep evaluation of OSA, complicated by  AFib, HBP, Allergic Rhinitis,   GERD, Hypothyroid, Upper Airway Cough,  Pt of TP and PM had sleep study ordered by TP. Scheduled with me as established patient, apparently because I read the study.  HST 09/24/2018- AHI 30.5/ hr, desaturation to 76%, body weight 249.6 lbs -----pt states she still has cough w/ clear-yellow mucus, reports DOE, takes albuterol once daily, states she has trouble with inhaler technique, inquiring about aerochamber; would like flu shot today Body weight today 249 lbs Epworth score 16 Medical problem list includes AFib, HBP, Allergic Rhinitis,   GERD, Hypothyroid, Upper Airway Cough,  Had remote dx OSA. CPAP machine > 74 yrs old, not used in long time. She remembers having to get up frequently at that time for care of  children- CPAP was too inconvenient. We discussed CPAP, oral appliances and surgery therapy options, medical concerns of untreated OSA and sleep hygiene.   Cough- chronic, sometimes worse. Not helped by GERD RX/ GI work-up, ENT visits. Albuterol inhaler helps some, but she isn't confident about technique and asks spacer.   Prior to Admission medications   Medication Sig Start Date End Date Taking? Authorizing Provider  albuterol (VENTOLIN HFA) 108 (90 Base) MCG/ACT inhaler Inhale 1-2 puffs into the lungs every 4 (four) hours as needed for wheezing or shortness of breath. 07/22/18  Yes Parrett, Tammy S, NP  amiodarone (PACERONE) 200 MG tablet TAKE 1 TABLET BY MOUTH EVERY DAY 10/10/18  Yes End, Harrell Gave, MD  amLODipine (NORVASC) 5 MG tablet TAKE 1 TABLET BY MOUTH EVERY DAY 10/10/18  Yes End, Harrell Gave, MD  Ascorbic Acid (VITAMIN C WITH ROSE HIPS) 1000 MG tablet Take 1,000 mg by mouth daily.   Yes [provider]  benzonatate (TESSALON) 200 MG capsule Take 1 capsule (200 mg total) by mouth 3 (three) times daily as needed for cough. 09/14/18  Yes Parrett, Tammy S, NP   buPROPion (WELLBUTRIN XL) 300 MG 24 hr tablet Take 1 tablet (300 mg total) by mouth daily. 03/15/17  Yes Arfeen, Arlyce Harman, MD  CALCIUM PO Take 1 tablet by mouth every morning.   Yes [provider]  chlorpheniramine (CHLOR-TRIMETON) 4 MG tablet Take 4 mg by mouth at bedtime.   Yes [provider]  Cholecalciferol (VITAMIN D) 2000 UNITS tablet Take 4,000 Units by mouth daily.   Yes [provider]  CVS MELATONIN PO Take 1 tablet by mouth at bedtime as needed.   Yes [provider]  ELIQUIS 5 MG TABS tablet TAKE 1 TABLET BY MOUTH TWICE DAILY 08/17/18  Yes Jerline Pain, MD  fexofenadine (ALLEGRA) 180 MG tablet Take 180 mg by mouth daily as needed for allergies or rhinitis.   Yes [provider]  furosemide (LASIX) 20 MG tablet TAKE 1 TABLET BY MOUTH ONCE DAILY 12/07/16  Yes End, Harrell Gave, MD  levothyroxine (SYNTHROID) 75 MCG tablet Take 1 tablet (75 mcg total) by mouth daily before breakfast. 05/06/18  Yes Renato Shin, MD  metoprolol succinate (TOPROL-XL) 25 MG 24 hr tablet TAKE 1/2 (ONE-HALF) TABLET BY MOUTH ONCE DAILY 01/17/18  Yes End, Harrell Gave, MD  potassium chloride SA (K-DUR) 20 MEQ tablet Take 1 tablet (20 mEq total) by mouth daily. 06/07/18  Yes End, Harrell Gave, MD  pravastatin (PRAVACHOL) 40 MG tablet Take 40 mg by mouth at bedtime.   Yes [provider]  Glycopyrrolate-Formoterol (BEVESPI AEROSPHERE) 9-4.8 MCG/ACT AERO Inhale 2 puffs into the lungs 2 (  two) times daily. 10/13/18   Deneise Lever, MD  Spacer/Aero-Holding Chambers (AEROCHAMBER MV) inhaler Use as instructed 10/13/18   Deneise Lever, MD   Past Medical History:  Diagnosis Date  . Anxiety   . Arthritis   . Cataracts, bilateral    immature  . CKD (chronic kidney disease) stage 3, GFR 30-59 ml/min   . Depression   . GERD (gastroesophageal reflux disease)    was on Nexium   . GERD (gastroesophageal reflux disease)   . Hemorrhoid   . History of bronchitis    couple  of yrs ago  . History of colon polyps   . History of peristent atrial fibrillation   . Hyperlipidemia    takes Pravastatin daily  . Hypertension    takes Amlodipine and Diovan daily  . Hypertension   . Pre-diabetes   . Sleep apnea    no cpap used  . Sleep apnea    study done 20+yrs ago and doesn't use cpap  . Urinary urgency   . Weakness    in left leg r/t back   Past Surgical History:  Procedure Laterality Date  . ABDOMINAL HYSTERECTOMY  1980   partial  . ABDOMINAL HYSTERECTOMY     partial  . BACK SURGERY    . CARDIOVERSION N/A 02/24/2016   Procedure: CARDIOVERSION;  Surgeon: Fay Records, MD;  Location: [facility] ENDOSCOPY;  Service: Cardiovascular;  Laterality: N/A;  . CARDIOVERSION N/A 03/19/2016   Procedure: CARDIOVERSION;  Surgeon: Fay Records, MD;  Location: [place];  Service: Cardiovascular;  Laterality: N/A;  . CARPAL TUNNEL RELEASE    . CHOLECYSTECTOMY  1975  . CHOLECYSTECTOMY    . COLONOSCOPY    . COLONOSCOPY WITH PROPOFOL  12/29/2011   Procedure: COLONOSCOPY WITH PROPOFOL;  Surgeon: Lear Ng, MD;  Location: [facility] ENDOSCOPY;  Service: Endoscopy;  Laterality: N/A;  . COLONOSCOPY WITH PROPOFOL N/A 05/24/2014   Procedure: COLONOSCOPY WITH PROPOFOL;  Surgeon: Wilford Corner, MD;  Location: [facility] ENDOSCOPY;  Service: Endoscopy;  Laterality: N/A;  . ESOPHAGEAL MANOMETRY N/A 02/22/2017   Procedure: ESOPHAGEAL MANOMETRY (EM);  Surgeon: Wilford Corner, MD;  Location: [facility] ENDOSCOPY;  Service: Endoscopy;  Laterality: N/A;  . ESOPHAGOGASTRODUODENOSCOPY    . FOOT SURGERY     left, right  . FRACTURE SURGERY     left leg-knee  . HEEL SPUR EXCISION Bilateral   . HOT HEMOSTASIS  12/29/2011   Procedure: HOT HEMOSTASIS (ARGON PLASMA COAGULATION/BICAP);  Surgeon: Lear Ng, MD;  Location: [facility];  Service: Endoscopy;  Laterality: N/A;  . HOT HEMOSTASIS N/A 05/24/2014   Procedure: HOT HEMOSTASIS (ARGON PLASMA COAGULATION/BICAP);  Surgeon: Wilford Corner, MD;   Location: [facility] ENDOSCOPY;  Service: Endoscopy;  Laterality: N/A;  . leg surgery d/t break Left   . LUMBAR LAMINECTOMY/DECOMPRESSION MICRODISCECTOMY Left 04/05/2013   Procedure: LUMBAR FOUR TO FIVE LUMBAR LAMINECTOMY/DECOMPRESSION MICRODISCECTOMY 1 LEVEL;  Surgeon: Eustace Moore, MD;  Location: [place] NEURO ORS;  Service: Neurosurgery;  Laterality: Left;  . NISSEN FUNDOPLICATION    . [place] IMPEDANCE STUDY N/A 02/22/2017   Procedure: [place] IMPEDANCE STUDY;  Surgeon: Wilford Corner, MD;  Location: [facility] ENDOSCOPY;  Service: Endoscopy;  Laterality: N/A;  . TEE WITHOUT CARDIOVERSION N/A 02/24/2016   Procedure: TRANSESOPHAGEAL ECHOCARDIOGRAM (TEE);  Surgeon: Fay Records, MD;  Location: [place];  Service: Cardiovascular;  Laterality: N/A;  . TOTAL KNEE ARTHROPLASTY Left 10/07/2016  . TOTAL KNEE ARTHROPLASTY Left 10/07/2016   Procedure: TOTAL KNEE ARTHROPLASTY;  Surgeon: [name],  MD;  Location: [place];  Service: Orthopedics;  Laterality: Left;   Family History  Problem Relation Age of Onset  . Colon cancer Mother   . Heart disease Father   . Breast cancer Neg Hx   . Thyroid disease Neg Hx    Social History   Socioeconomic History  . Marital status: Married    Spouse name: Dominica Severin  . Number of children: 2  . Years of education: 28  . Highest education level: Some college, no degree  Occupational History  . Not on file  Social Needs  . Financial resource strain: Not very hard  . Food insecurity    Worry: Patient refused    Inability: Patient refused  . Transportation needs    Medical: Patient refused    Non-medical: Patient refused  Tobacco Use  . Smoking status: Never Smoker  . Smokeless tobacco: Never Used  Substance and Sexual Activity  . Alcohol use: No  . Drug use: No  . Sexual activity: Yes    Birth control/protection: Surgical  Lifestyle  . Physical activity    Days per week: Patient refused    Minutes per session: Patient refused  . Stress: Very much  Relationships  .  Social Herbalist on phone: Twice a week    Gets together: Twice a week    Attends religious service: More than 4 times per year    Active member of club or organization: No    Attends meetings of clubs or organizations: Never    Relationship status: Married  . Intimate partner violence    Fear of current or ex partner: No    Emotionally abused: No    Physically abused: No    Forced sexual activity: No  Other Topics Concern  . Not on file  Social History Narrative   ** Merged History Encounter **       ROS-see HPI   + = positive Constitutional:    weight loss, night sweats, fevers, chills, +fatigue, lassitude. HEENT:    headaches, difficulty swallowing, tooth/dental problems, sore throat,       sneezing, itching, ear ache, nasal congestion, post nasal drip, snoring CV:    chest pain, orthopnea, PND, swelling in lower extremities, anasarca,                                  dizziness, palpitations Resp:   shortness of breath with exertion or at rest.                +productive cough,   non-productive cough, coughing up of blood.              change in color of mucus.  wheezing.   Skin:    rash or lesions. GI:  No-   heartburn, indigestion, abdominal pain, nausea, vomiting, diarrhea,                 change in bowel habits, loss of appetite GU: dysuria, change in color of urine, no urgency or frequency.   flank pain. MS:   joint pain, stiffness, decreased range of motion, back pain. Neuro-     nothing unusual Psych:  change in mood or affect.  depression or anxiety.   memory loss.  OBJ- Physical Exam General- Alert, Oriented, Affect-appropriate, Distress- none acute, + obese Skin- rash-none, lesions- none, excoriation- none Lymphadenopathy- none Head- atraumatic  Eyes- Gross vision intact, PERRLA, conjunctivae and secretions clear            Ears- Hearing, canals-normal            Nose- Clear, no-Septal dev, mucus, polyps, erosion, perforation              Throat- Mallampati III , mucosa clear , drainage- none, tonsils- atrophic Neck- flexible , trachea midline, no stridor , thyroid nl, carotid no bruit Chest - symmetrical excursion , unlabored           Heart/CV- RRR , no murmur , no gallop  , no rub, nl s1 s2                           - JVD- none , edema- none, stasis changes- none, varices- none           Lung- + coarse , wheeze- none, cough- none , dullness-none, rub- none           Chest wall-  Abd-  Br/ Gen/ Rectal- Not done, not indicated Extrem- cyanosis- none, clubbing, none, atrophy- none, strength- nl Neuro- grossly intact to observation

## 2018-10-13 NOTE — Assessment & Plan Note (Signed)
Discussed previous experience, adverse effects of untreated OSA and treatment options  She chooses to try CPAP again. Plan CPAP auto

## 2018-10-13 NOTE — Assessment & Plan Note (Signed)
She indicates cough center in upper mid chest, somewhat improved by use of albuterol, but feels she has trouble with inhaler technique and wwould like to try aerochamber- discussed. Plan- sample trial of Bevespi and aerochamber to use with Bevespi and albuterol for trial. Flu vax

## 2018-10-23 ENCOUNTER — Other Ambulatory Visit: Payer: Self-pay | Admitting: Adult Health

## 2018-11-01 ENCOUNTER — Ambulatory Visit: Payer: Medicare Other | Admitting: Cardiology

## 2018-11-01 ENCOUNTER — Other Ambulatory Visit: Payer: Self-pay

## 2018-11-01 ENCOUNTER — Encounter: Payer: Self-pay | Admitting: Cardiology

## 2018-11-01 VITALS — BP 128/70 | HR 66 | Ht 67.0 in | Wt 248.0 lb

## 2018-11-01 DIAGNOSIS — I4819 Other persistent atrial fibrillation: Secondary | ICD-10-CM | POA: Diagnosis not present

## 2018-11-01 DIAGNOSIS — I10 Essential (primary) hypertension: Secondary | ICD-10-CM | POA: Diagnosis not present

## 2018-11-01 DIAGNOSIS — I340 Nonrheumatic mitral (valve) insufficiency: Secondary | ICD-10-CM | POA: Diagnosis not present

## 2018-11-01 NOTE — Patient Instructions (Signed)
Medication Instructions:  The current medical regimen is effective;  continue present plan and medications.  *If you need a refill on your cardiac medications before your next appointment, please call your pharmacy*  Testing/Procedures: Your physician has requested that you have an echocardiogram. Echocardiography is a painless test that uses sound waves to create images of your heart. It provides your doctor with information about the size and shape of your heart and how well your heart's chambers and valves are working. This procedure takes approximately one hour. There are no restrictions for this procedure.  Follow-Up: At [facility], you and your health needs are our priority.  As part of our continuing mission to provide you with exceptional heart care, we have created designated Provider Care Teams.  These Care Teams include your primary Cardiologist (physician) and Advanced Practice Providers (APPs -  Physician Assistants and Nurse Practitioners) who all work together to provide you with the care you need, when you need it.  Your next appointment:   12 months  The format for your next appointment:   In Person  Provider:   You may see Mark Skains, MD or one of the following Advanced Practice Providers on your designated Care Team:    Lori Gerhardt, NP  Laura Ingold, NP  Jill McDaniel, NP   Thank you for choosing [facility]!!     

## 2018-11-01 NOTE — Progress Notes (Signed)
Cardiology Office Note:    Date:  11/01/2018   ID:  [name], [name] [birth year], MRN [id number]  PCP:  [name] Stains, MD  Cardiologist:  [name] Furbish, MD  Electrophysiologist:  None   Referring MD: [name] Stains, MD     History of Present Illness:    [name] [name] is a 73 y.o. female with paroxysmal atrial fibrillation here for follow-up.  Has been on amiodarone and Eliquis.  Pulmonary medicine has been following her with her amiodarone.  Currently in normal sinus rhythm at rate 66.  Past Medical History:  Diagnosis Date  . Anxiety   . Arthritis   . Cataracts, bilateral    immature  . CKD (chronic kidney disease) stage 3, GFR 30-59 ml/min   . Depression   . GERD (gastroesophageal reflux disease)    was on Nexium   . GERD (gastroesophageal reflux disease)   . Hemorrhoid   . History of bronchitis    couple of yrs ago  . History of colon polyps   . History of peristent atrial fibrillation   . Hyperlipidemia    takes Pravastatin daily  . Hypertension    takes Amlodipine and Diovan daily  . Hypertension   . Pre-diabetes   . Sleep apnea    no cpap used  . Sleep apnea    study done 20+yrs ago and doesn't use cpap  . Urinary urgency   . Weakness    in left leg r/t back    Past Surgical History:  Procedure Laterality Date  . ABDOMINAL HYSTERECTOMY  1980   partial  . ABDOMINAL HYSTERECTOMY     partial  . BACK SURGERY    . CARDIOVERSION N/A 02/24/2016   Procedure: CARDIOVERSION;  Surgeon: Fay Records, MD;  Location: [facility] ENDOSCOPY;  Service: Cardiovascular;  Laterality: N/A;  . CARDIOVERSION N/A 03/19/2016   Procedure: CARDIOVERSION;  Surgeon: Fay Records, MD;  Location: [place];  Service: Cardiovascular;  Laterality: N/A;  . CARPAL TUNNEL RELEASE    . CHOLECYSTECTOMY  1975  . CHOLECYSTECTOMY    . COLONOSCOPY    . COLONOSCOPY WITH PROPOFOL  12/29/2011   Procedure: COLONOSCOPY WITH PROPOFOL;  Surgeon: Lear Ng, MD;  Location: [facility] ENDOSCOPY;  Service:  Endoscopy;  Laterality: N/A;  . COLONOSCOPY WITH PROPOFOL N/A 05/24/2014   Procedure: COLONOSCOPY WITH PROPOFOL;  Surgeon: Wilford Corner, MD;  Location: [facility] ENDOSCOPY;  Service: Endoscopy;  Laterality: N/A;  . ESOPHAGEAL MANOMETRY N/A 02/22/2017   Procedure: ESOPHAGEAL MANOMETRY (EM);  Surgeon: Wilford Corner, MD;  Location: [facility] ENDOSCOPY;  Service: Endoscopy;  Laterality: N/A;  . ESOPHAGOGASTRODUODENOSCOPY    . FOOT SURGERY     left, right  . FRACTURE SURGERY     left leg-knee  . HEEL SPUR EXCISION Bilateral   . HOT HEMOSTASIS  12/29/2011   Procedure: HOT HEMOSTASIS (ARGON PLASMA COAGULATION/BICAP);  Surgeon: Lear Ng, MD;  Location: [facility];  Service: Endoscopy;  Laterality: N/A;  . HOT HEMOSTASIS N/A 05/24/2014   Procedure: HOT HEMOSTASIS (ARGON PLASMA COAGULATION/BICAP);  Surgeon: Wilford Corner, MD;  Location: [facility];  Service: Endoscopy;  Laterality: N/A;  . leg surgery d/t break Left   . LUMBAR LAMINECTOMY/DECOMPRESSION MICRODISCECTOMY Left 04/05/2013   Procedure: LUMBAR FOUR TO FIVE LUMBAR LAMINECTOMY/DECOMPRESSION MICRODISCECTOMY 1 LEVEL;  Surgeon: Eustace Moore, MD;  Location: [place] [facility];  Service: Neurosurgery;  Laterality: Left;  . NISSEN FUNDOPLICATION    . [place] IMPEDANCE STUDY N/A 02/22/2017   Procedure: [place] IMPEDANCE STUDY;  Surgeon: Wilford Corner, MD;  Location: [facility];  Service: Endoscopy;  Laterality: N/A;  . TEE WITHOUT CARDIOVERSION N/A 02/24/2016   Procedure: TRANSESOPHAGEAL ECHOCARDIOGRAM (TEE);  Surgeon: Fay Records, MD;  Location: [place];  Service: Cardiovascular;  Laterality: N/A;  . TOTAL KNEE ARTHROPLASTY Left 10/07/2016  . TOTAL KNEE ARTHROPLASTY Left 10/07/2016   Procedure: TOTAL KNEE ARTHROPLASTY;  Surgeon: [name], MD;  Location: [place];  Service: Orthopedics;  Laterality: Left;    Current Medications: Current Meds  Medication Sig  . albuterol (VENTOLIN HFA) 108 (90 Base) MCG/ACT inhaler Inhale 1-2 puffs into  the lungs every 4 (four) hours as needed for wheezing or shortness of breath.  Marland Kitchen amiodarone (PACERONE) 200 MG tablet TAKE 1 TABLET BY MOUTH EVERY DAY  . amLODipine (NORVASC) 5 MG tablet TAKE 1 TABLET BY MOUTH EVERY DAY  . Ascorbic Acid (VITAMIN C WITH ROSE HIPS) 1000 MG tablet Take 1,000 mg by mouth daily.  . benzonatate (TESSALON) 200 MG capsule Take 1 capsule by mouth three times daily as needed for cough  . buPROPion (WELLBUTRIN XL) 300 MG 24 hr tablet Take 1 tablet (300 mg total) by mouth daily.  Marland Kitchen CALCIUM PO Take 1 tablet by mouth every morning.  . chlorpheniramine (CHLOR-TRIMETON) 4 MG tablet Take 4 mg by mouth at bedtime.  . Cholecalciferol (VITAMIN D) 2000 UNITS tablet Take 4,000 Units by mouth daily.  . CVS MELATONIN PO Take 1 tablet by mouth at bedtime as needed.  Marland Kitchen ELIQUIS 5 MG TABS tablet TAKE 1 TABLET BY MOUTH TWICE DAILY  . fexofenadine (ALLEGRA) 180 MG tablet Take 180 mg by mouth daily as needed for allergies or rhinitis.  . furosemide (LASIX) 20 MG tablet TAKE 1 TABLET BY MOUTH ONCE DAILY  . Glycopyrrolate-Formoterol (BEVESPI AEROSPHERE) 9-4.8 MCG/ACT AERO Inhale 2 puffs into the lungs 2 (two) times daily.  Marland Kitchen levothyroxine (SYNTHROID) 75 MCG tablet Take 1 tablet (75 mcg total) by mouth daily before breakfast.  . metoprolol succinate (TOPROL-XL) 25 MG 24 hr tablet TAKE 1/2 (ONE-HALF) TABLET BY MOUTH ONCE DAILY  . potassium chloride SA (K-DUR) 20 MEQ tablet Take 1 tablet (20 mEq total) by mouth daily.  . pravastatin (PRAVACHOL) 40 MG tablet Take 40 mg by mouth at bedtime.  Marland Kitchen Spacer/Aero-Holding Chambers (AEROCHAMBER MV) inhaler Use as instructed     Allergies:   Lexapro [escitalopram], Peroxide [hydrogen peroxide], Zoloft [sertraline hcl], and Neosporin [neomycin-bacitracin zn-polymyx]   Social History   Socioeconomic History  . Marital status: Married    Spouse name: [name] [name]  . Number of children: 2  . Years of education: 44  . Highest education level: Some college, no  degree  Occupational History  . Not on file  Social Needs  . Financial resource strain: Not very hard  . Food insecurity    Worry: Patient refused    Inability: Patient refused  . Transportation needs    Medical: Patient refused    Non-medical: Patient refused  Tobacco Use  . Smoking status: Never Smoker  . Smokeless tobacco: Never Used  Substance and Sexual Activity  . Alcohol use: No  . Drug use: No  . Sexual activity: Yes    Birth control/protection: Surgical  Lifestyle  . Physical activity    Days per week: Patient refused    Minutes per session: Patient refused  . Stress: Very much  Relationships  . Social connections    Talks on phone: Twice a week    Gets together: Twice a week  Attends religious service: More than 4 times per year    Active member of club or organization: No    Attends meetings of clubs or organizations: Never    Relationship status: Married  Other Topics Concern  . Not on file  Social History Narrative   ** Merged History Encounter **         Family History: The patient's family history includes Colon cancer in her mother; Heart disease in her father. There is no history of Breast cancer or Thyroid disease.  ROS:   Please see the history of present illness.     All other systems reviewed and are negative.  EKGs/Labs/Other Studies Reviewed:    The following studies were reviewed today: TEE 2018-moderate mitral regurgitation.  EKG: 11/01/2018-sinus rhythm 66 nonspecific ST changes.  Personally reviewed Recent Labs: 11/15/2017: ALT 32; BUN 14; Creatinine, Ser 1.33; Potassium 4.2; Sodium 143 07/11/2018: TSH 2.13  Recent Lipid Panel    Component Value Date/Time   CHOL 125 02/18/2016 0454   TRIG 90 02/18/2016 0454   HDL 41 02/18/2016 0454   CHOLHDL 3.0 02/18/2016 0454   VLDL 18 02/18/2016 0454   LDLCALC 66 02/18/2016 0454    Physical Exam:    VS:  BP 128/70   Pulse 66   Ht 5\' 7"  (1.702 m)   Wt 248 lb (112.5 kg)   SpO2 97%    BMI 38.84 kg/m     Wt Readings from Last 3 Encounters:  11/01/18 248 lb (112.5 kg)  10/13/18 249 lb 9.6 oz (113.2 kg)  07/22/18 249 lb 6.4 oz (113.1 kg)     GEN:  Well nourished, well developed in no acute distress HEENT: Normal NECK: No JVD; No carotid bruits LYMPHATICS: No lymphadenopathy CARDIAC: RRR, soft systolic, no rubs, gallops RESPIRATORY:  Clear to auscultation without rales, wheezing or rhonchi  ABDOMEN: Soft, non-tender, non-distended MUSCULOSKELETAL:  No edema; No deformity  SKIN: Warm and dry NEUROLOGIC:  Alert and oriented x 3 PSYCHIATRIC:  Normal affect   ASSESSMENT:    1. Persistent atrial fibrillation ([place])   2. Essential hypertension   3. Nonrheumatic mitral valve regurgitation    PLAN:    In order of problems listed above:  Paroxysmal atrial fibrillation - Amiodarone, Eliquis.  Pulmonary has been following for amiodarone.  Has been maintaining sinus rhythm.  EF is normal.  Chronic anticoagulation -Eliquis.  She did recently have a fall in the parking lot at [employer].  Mechanical.  She did bite through her lip.  She was evaluated.  Has right periorbital ecchymosis but no broken bones.  No evidence of brain injury.  Dyspnea on exertion - Likely multifactorial, longstanding.  Continue encouragement of activity.  Also has a longstanding mild cough.  She sees pulmonary.  Essential hypertension - There was talk previously about starting back losartan, angiotensin receptor blocker but she has not used this medication.  She had had valsartan in the past but this was discontinued when she was hypotensive in the setting of A. fib with RVR.  Blood work has been checked.  She is also on very low-dose Toprol.  Overall her blood pressures are fairly reasonable when checked at home.  We went through some values.  Essential tremor -I did mention her that we could potentially swap the metoprolol for propranolol, nonselective beta-blocker which can sometimes help with  essential tremor.  At this time she would like to hold off on any medication changes.  Mitral regurgitation.  - ECHO - moderate MR  by TEE.   Medication Adjustments/Labs and Tests Ordered: Current medicines are reviewed at length with the patient today.  Concerns regarding medicines are outlined above.  Orders Placed This Encounter  Procedures  . EKG 12-Lead  . ECHOCARDIOGRAM COMPLETE   No orders of the defined types were placed in this encounter.   Patient Instructions  Medication Instructions:  The current medical regimen is effective;  continue present plan and medications.  *If you need a refill on your cardiac medications before your next appointment, please call your pharmacy*  Testing/Procedures: Your physician has requested that you have an echocardiogram. Echocardiography is a painless test that uses sound waves to create images of your heart. It provides your doctor with information about the size and shape of your heart and how well your heart's chambers and valves are working. This procedure takes approximately one hour. There are no restrictions for this procedure.  Follow-Up: At [facility], you and your health needs are our priority.  As part of our continuing mission to provide you with exceptional heart care, we have created designated Provider Care Teams.  These Care Teams include your primary Cardiologist (physician) and Advanced Practice Providers (APPs -  Physician Assistants and Nurse Practitioners) who all work together to provide you with the care you need, when you need it.  Your next appointment:   12 months  The format for your next appointment:   In Person  Provider:   You may see [name] Furbish, MD or one of the following Advanced Practice Providers on your designated Care Team:    Truitt Merle, NP  Cecilie Kicks, NP  Kathyrn Drown, NP   Thank you for choosing [facility]!!        Signed, [name] Furbish, MD  11/01/2018 5:13 PM     [place]

## 2018-11-04 ENCOUNTER — Other Ambulatory Visit: Payer: Self-pay | Admitting: Cardiology

## 2018-11-04 MED ORDER — AMLODIPINE BESYLATE 5 MG PO TABS: 5.0000 mg | ORAL_TABLET | Freq: Every day | ORAL | 3 refills | Status: DC

## 2018-11-04 MED ORDER — AMIODARONE HCL 200 MG PO TABS: 200.0000 mg | ORAL_TABLET | Freq: Every day | ORAL | 3 refills | Status: DC

## 2018-11-11 ENCOUNTER — Other Ambulatory Visit: Payer: Self-pay

## 2018-11-11 ENCOUNTER — Ambulatory Visit (HOSPITAL_COMMUNITY): Payer: Medicare Other | Attending: Cardiology

## 2018-11-11 ENCOUNTER — Other Ambulatory Visit: Payer: Self-pay | Admitting: Internal Medicine

## 2018-11-11 DIAGNOSIS — I340 Nonrheumatic mitral (valve) insufficiency: Secondary | ICD-10-CM | POA: Insufficient documentation

## 2018-11-11 DIAGNOSIS — I10 Essential (primary) hypertension: Secondary | ICD-10-CM | POA: Insufficient documentation

## 2018-11-18 ENCOUNTER — Other Ambulatory Visit: Payer: Self-pay

## 2018-11-18 ENCOUNTER — Ambulatory Visit
Admission: RE | Admit: 2018-11-18 | Discharge: 2018-11-18 | Disposition: A | Discharge status: Home / Self Care | Payer: Medicare Other | Source: Ambulatory Visit | Attending: Family Medicine | Admitting: Family Medicine

## 2018-11-18 DIAGNOSIS — Z1231 Encounter for screening mammogram for malignant neoplasm of breast: Secondary | ICD-10-CM

## 2018-12-01 ENCOUNTER — Other Ambulatory Visit: Payer: Self-pay | Admitting: Internal Medicine

## 2018-12-14 ENCOUNTER — Encounter: Payer: Self-pay | Admitting: Endocrinology

## 2019-01-04 ENCOUNTER — Encounter: Payer: Self-pay | Admitting: Endocrinology

## 2019-01-04 ENCOUNTER — Other Ambulatory Visit: Payer: Self-pay

## 2019-01-04 ENCOUNTER — Telehealth: Payer: Self-pay | Admitting: *Deleted

## 2019-01-04 DIAGNOSIS — E032 Hypothyroidism due to medicaments and other exogenous substances: Secondary | ICD-10-CM

## 2019-01-04 MED ORDER — POTASSIUM CHLORIDE CRYS ER 20 MEQ PO TBCR: 20.0000 meq | EXTENDED_RELEASE_TABLET | Freq: Every day | ORAL | 3 refills | Status: DC

## 2019-01-04 MED ORDER — FUROSEMIDE 20 MG PO TABS: 20.0000 mg | ORAL_TABLET | Freq: Every day | ORAL | 2 refills | Status: DC

## 2019-01-04 MED ORDER — METOPROLOL SUCCINATE ER 25 MG PO TB24: ORAL_TABLET | ORAL | 3 refills | Status: DC

## 2019-01-04 MED ORDER — LEVOTHYROXINE SODIUM 75 MCG PO TABS: 75.0000 ug | ORAL_TABLET | Freq: Every day | ORAL | 0 refills | Status: DC

## 2019-01-04 MED ORDER — APIXABAN 5 MG PO TABS: 5.0000 mg | ORAL_TABLET | Freq: Two times a day (BID) | ORAL | 2 refills | Status: DC

## 2019-01-04 MED ORDER — AMIODARONE HCL 200 MG PO TABS: 200.0000 mg | ORAL_TABLET | Freq: Every day | ORAL | 3 refills | Status: DC

## 2019-01-04 NOTE — Telephone Encounter (Signed)
Latrelle Dodrill, MD 2 hours ago (12:56 PM)   I am changing insurance for 2021 to [facility] starting Jan 13 2019.  They asked that you sent them my prescriptions for all of my medication that you prescribe for me.  Amiodarone, Eliquis, Furosemide, Metoprolol Succinate, potassium chloride.  Member ID [id number] effective date 01/13/2019.  RX ID [id number], RX Goup [id number], RX [id number], Rx [id number].  [address], [address] [place], [place] [address].  I do not have a card at this time, but this is the information [employer] gave me to used until I get my card.  I just want to be sure I do not run out of medicine.  Please let me know if this is a problem.   Refills sent into [facility] Mail order pharmacy as requested.

## 2019-01-16 ENCOUNTER — Ambulatory Visit: Payer: Medicare HMO | Admitting: Endocrinology

## 2019-01-16 ENCOUNTER — Encounter: Payer: Self-pay | Admitting: Endocrinology

## 2019-01-16 ENCOUNTER — Other Ambulatory Visit: Payer: Self-pay

## 2019-01-16 VITALS — BP 140/60 | HR 71 | Ht 67.0 in | Wt 250.6 lb

## 2019-01-16 DIAGNOSIS — E032 Hypothyroidism due to medicaments and other exogenous substances: Secondary | ICD-10-CM

## 2019-01-16 LAB — T4, FREE: Free T4: 1.17 ng/dL (ref 0.60–1.60)

## 2019-01-16 LAB — TSH: TSH: 2.14 u[IU]/mL (ref 0.35–4.50)

## 2019-01-16 MED ORDER — LEVOTHYROXINE SODIUM 75 MCG PO TABS: 75.0000 ug | ORAL_TABLET | Freq: Every day | ORAL | 3 refills | Status: DC

## 2019-01-16 NOTE — Patient Instructions (Signed)
Blood tests are requested for you today.  We'll let you know about the results.  Please come back for a follow-up appointment in 6 months.   

## 2019-01-16 NOTE — Progress Notes (Signed)
Subjective:    Patient ID: [name] [name], female    DOB: Jun 10, 1945, 74 y.o.   MRN: [id number]  HPI Pt returns for f/u of hypothyroidism (she has been on amiodarone since 2018; hypothyroidism was dx'ed in late 2019, when she was rx'ed synthroid).  pt states fatigue Past Medical History:  Diagnosis Date  . Anxiety   . Arthritis   . Cataracts, bilateral    immature  . CKD (chronic kidney disease) stage 3, GFR 30-59 ml/min   . Depression   . GERD (gastroesophageal reflux disease)    was on Nexium   . GERD (gastroesophageal reflux disease)   . Hemorrhoid   . History of bronchitis    couple of yrs ago  . History of colon polyps   . History of peristent atrial fibrillation   . Hyperlipidemia    takes Pravastatin daily  . Hypertension    takes Amlodipine and Diovan daily  . Hypertension   . Pre-diabetes   . Sleep apnea    no cpap used  . Sleep apnea    study done 20+yrs ago and doesn't use cpap  . Urinary urgency   . Weakness    in left leg r/t back    Past Surgical History:  Procedure Laterality Date  . ABDOMINAL HYSTERECTOMY  1980   partial  . ABDOMINAL HYSTERECTOMY     partial  . BACK SURGERY    . CARDIOVERSION N/A 02/24/2016   Procedure: CARDIOVERSION;  Surgeon: Fay Records, MD;  Location: [facility] ENDOSCOPY;  Service: Cardiovascular;  Laterality: N/A;  . CARDIOVERSION N/A 03/19/2016   Procedure: CARDIOVERSION;  Surgeon: Fay Records, MD;  Location: [place];  Service: Cardiovascular;  Laterality: N/A;  . CARPAL TUNNEL RELEASE    . CHOLECYSTECTOMY  1975  . CHOLECYSTECTOMY    . COLONOSCOPY    . COLONOSCOPY WITH PROPOFOL  12/29/2011   Procedure: COLONOSCOPY WITH PROPOFOL;  Surgeon: Lear Ng, MD;  Location: [facility] ENDOSCOPY;  Service: Endoscopy;  Laterality: N/A;  . COLONOSCOPY WITH PROPOFOL N/A 05/24/2014   Procedure: COLONOSCOPY WITH PROPOFOL;  Surgeon: Wilford Corner, MD;  Location: [facility] ENDOSCOPY;  Service: Endoscopy;  Laterality: N/A;  . ESOPHAGEAL MANOMETRY  N/A 02/22/2017   Procedure: ESOPHAGEAL MANOMETRY (EM);  Surgeon: Wilford Corner, MD;  Location: [facility] ENDOSCOPY;  Service: Endoscopy;  Laterality: N/A;  . ESOPHAGOGASTRODUODENOSCOPY    . FOOT SURGERY     left, right  . FRACTURE SURGERY     left leg-knee  . HEEL SPUR EXCISION Bilateral   . HOT HEMOSTASIS  12/29/2011   Procedure: HOT HEMOSTASIS (ARGON PLASMA COAGULATION/BICAP);  Surgeon: Lear Ng, MD;  Location: [facility];  Service: Endoscopy;  Laterality: N/A;  . HOT HEMOSTASIS N/A 05/24/2014   Procedure: HOT HEMOSTASIS (ARGON PLASMA COAGULATION/BICAP);  Surgeon: Wilford Corner, MD;  Location: [facility];  Service: Endoscopy;  Laterality: N/A;  . leg surgery d/t break Left   . LUMBAR LAMINECTOMY/DECOMPRESSION MICRODISCECTOMY Left 04/05/2013   Procedure: LUMBAR FOUR TO FIVE LUMBAR LAMINECTOMY/DECOMPRESSION MICRODISCECTOMY 1 LEVEL;  Surgeon: Eustace Moore, MD;  Location: [place] [facility];  Service: Neurosurgery;  Laterality: Left;  . NISSEN FUNDOPLICATION    . [place] IMPEDANCE STUDY N/A 02/22/2017   Procedure: [place] IMPEDANCE STUDY;  Surgeon: Wilford Corner, MD;  Location: [facility] ENDOSCOPY;  Service: Endoscopy;  Laterality: N/A;  . TEE WITHOUT CARDIOVERSION N/A 02/24/2016   Procedure: TRANSESOPHAGEAL ECHOCARDIOGRAM (TEE);  Surgeon: Fay Records, MD;  Location: [place];  Service: Cardiovascular;  Laterality: N/A;  .  TOTAL KNEE ARTHROPLASTY Left 10/07/2016  . TOTAL KNEE ARTHROPLASTY Left 10/07/2016   Procedure: TOTAL KNEE ARTHROPLASTY;  Surgeon: [name], MD;  Location: [place];  Service: Orthopedics;  Laterality: Left;    Social History   Socioeconomic History  . Marital status: Married    Spouse name: Dominica Severin  . Number of children: 2  . Years of education: 78  . Highest education level: Some college, no degree  Occupational History  . Not on file  Tobacco Use  . Smoking status: Never Smoker  . Smokeless tobacco: Never Used  Substance and Sexual Activity  . Alcohol use: No   . Drug use: No  . Sexual activity: Yes    Birth control/protection: Surgical  Other Topics Concern  . Not on file  Social History Narrative   ** Merged History Encounter **       Social Determinants of Health   Financial Resource Strain:   . Difficulty of Paying Living Expenses: Not on file  Food Insecurity:   . Worried About Charity fundraiser in the Last Year: Not on file  . Ran Out of Food in the Last Year: Not on file  Transportation Needs:   . Lack of Transportation (Medical): Not on file  . Lack of Transportation (Non-Medical): Not on file  Physical Activity:   . Days of Exercise per Week: Not on file  . Minutes of Exercise per Session: Not on file  Stress:   . Feeling of Stress : Not on file  Social Connections:   . Frequency of Communication with Friends and Family: Not on file  . Frequency of Social Gatherings with Friends and Family: Not on file  . Attends Religious Services: Not on file  . Active Member of Clubs or Organizations: Not on file  . Attends Archivist Meetings: Not on file  . Marital Status: Not on file  Intimate Partner Violence:   . Fear of Current or Ex-Partner: Not on file  . Emotionally Abused: Not on file  . Physically Abused: Not on file  . Sexually Abused: Not on file    Current Outpatient Medications on File Prior to Visit  Medication Sig Dispense Refill  . albuterol (VENTOLIN HFA) 108 (90 Base) MCG/ACT inhaler Inhale 1-2 puffs into the lungs every 4 (four) hours as needed for wheezing or shortness of breath. 1 g 1  . amiodarone (PACERONE) 200 MG tablet Take 1 tablet (200 mg total) by mouth daily. 90 tablet 3  . amLODipine (NORVASC) 5 MG tablet Take 1 tablet (5 mg total) by mouth daily. 90 tablet 3  . apixaban (ELIQUIS) 5 MG TABS tablet Take 1 tablet (5 mg total) by mouth 2 (two) times daily. 180 tablet 2  . Ascorbic Acid (VITAMIN C WITH ROSE HIPS) 1000 MG tablet Take 1,000 mg by mouth daily.    . benzonatate (TESSALON) 200 MG  capsule Take 1 capsule by mouth three times daily as needed for cough 60 capsule 0  . buPROPion (WELLBUTRIN XL) 300 MG 24 hr tablet Take 1 tablet (300 mg total) by mouth daily. 90 tablet 0  . CALCIUM PO Take 1 tablet by mouth every morning.    . chlorpheniramine (CHLOR-TRIMETON) 4 MG tablet Take 4 mg by mouth at bedtime.    . Cholecalciferol (VITAMIN D) 2000 UNITS tablet Take 4,000 Units by mouth daily.    . CVS MELATONIN PO Take 1 tablet by mouth at bedtime as needed.    . fexofenadine (ALLEGRA)  180 MG tablet Take 180 mg by mouth daily as needed for allergies or rhinitis.    . furosemide (LASIX) 20 MG tablet Take 1 tablet (20 mg total) by mouth daily. 90 tablet 2  . Glycopyrrolate-Formoterol (BEVESPI AEROSPHERE) 9-4.8 MCG/ACT AERO Inhale 2 puffs into the lungs 2 (two) times daily. 5.9 g 0  . metoprolol succinate (TOPROL-XL) 25 MG 24 hr tablet TAKE 1/2 (ONE-HALF) TABLET BY MOUTH ONCE DAILY 45 tablet 3  . potassium chloride SA (KLOR-CON) 20 MEQ tablet Take 1 tablet (20 mEq total) by mouth daily. 90 tablet 3  . pravastatin (PRAVACHOL) 40 MG tablet Take 40 mg by mouth at bedtime.    Marland Kitchen Spacer/Aero-Holding Chambers (AEROCHAMBER MV) inhaler Use as instructed 1 each 0   No current facility-administered medications on file prior to visit.    Allergies  Allergen Reactions  . Lexapro [Escitalopram]     Makes her feel funny  . Peroxide [Hydrogen Peroxide] Other (See Comments)    Redness.   [place] [Sertraline Hcl]     Makes her feel funny  . Neosporin [Neomycin-Bacitracin Zn-Polymyx] Rash    Blisters, itching.     Family History  Problem Relation Age of Onset  . Colon cancer Mother   . Heart disease Father   . Breast cancer Neg Hx   . Thyroid disease Neg Hx     BP 140/60 (BP Location: Right Arm, Patient Position: Sitting, Cuff Size: Large)   Pulse 71   Ht 5\' 7"  (1.702 m)   Wt 250 lb 9.6 oz (113.7 kg)   SpO2 98%   BMI 39.25 kg/m    Review of Systems She also has weight gain     Objective:   Physical Exam VITAL SIGNS:  See vs page GENERAL: no distress NECK: There is no palpable thyroid enlargement.  No thyroid nodule is palpable.  No palpable lymphadenopathy at the anterior neck.   Lab Results  Component Value Date   TSH 2.14 01/16/2019       Assessment & Plan:  Hypothyroidism: well-replaced AF: in this setting, she should maintain euthyroidism  Patient Instructions  Blood tests are requested for you today.  We'll let you know about the results.  Please come back for a follow-up appointment in 6 months.

## 2019-01-18 ENCOUNTER — Other Ambulatory Visit: Payer: Self-pay

## 2019-01-18 ENCOUNTER — Ambulatory Visit (INDEPENDENT_AMBULATORY_CARE_PROVIDER_SITE_OTHER): Payer: Medicare HMO | Admitting: Internal Medicine

## 2019-01-18 ENCOUNTER — Encounter: Payer: Self-pay | Admitting: Internal Medicine

## 2019-01-18 DIAGNOSIS — R05 Cough: Secondary | ICD-10-CM

## 2019-01-18 DIAGNOSIS — R053 Chronic cough: Secondary | ICD-10-CM

## 2019-01-18 DIAGNOSIS — G4733 Obstructive sleep apnea (adult) (pediatric): Secondary | ICD-10-CM

## 2019-01-18 NOTE — Patient Instructions (Signed)
Order- schedule mask fitting at [facility] continue CPAP auto 5-15, mask of choice, humidifier, supplies, Air/view/ card  Please keep you appointment to follow up on the cough and wheezing. I'm glad to hear the cough is better.  Please call if we can help

## 2019-01-18 NOTE — Assessment & Plan Note (Signed)
Benefits with improved sleep, but getting frustrated by full face mask and says also it aggravates her rosacea. Plan- continue auto 5-15, but arrange mask fitting

## 2019-01-18 NOTE — Progress Notes (Signed)
HPI F never smoker followed for OSA, complicated by  AFib/ amiodarone, HBP, Allergic Rhinitis,   GERD, Hypothyroid, Upper Airway Cough,  HST 09/24/2018- AHI 30.5/ hr, desaturation to 76%, body weight 249.6 lbs PFT 01/19/2018- Mild Diffusion defect  ----------------------------------------------------------------------------------------------  10/13/2018- 74 yo F never smoker for sleep evaluation of OSA, complicated by  AFib, HBP, Allergic Rhinitis,   GERD, Hypothyroid, Upper Airway Cough,  Pt of TP and PM had sleep study ordered by TP. Scheduled with me as established patient, apparently because I read the study.  HST 09/24/2018- AHI 30.5/ hr, desaturation to 76%, body weight 249.6 lbs -----pt states she still has cough w/ clear-yellow mucus, reports DOE, takes albuterol once daily, states she has trouble with inhaler technique, inquiring about aerochamber; would like flu shot today Body weight today 249 lbs Epworth score 16 Medical problem list includes AFib, HBP, Allergic Rhinitis,   GERD, Hypothyroid, Upper Airway Cough,  Had remote dx OSA. CPAP machine > 87 yrs old, not used in long time. She remembers having to get up frequently at that time for care of Kristina Mcnorton children- CPAP was too inconvenient. We discussed CPAP, oral appliances and surgery therapy options, medical concerns of untreated OSA and sleep hygiene.   Cough- chronic, sometimes worse. Not helped by GERD RX/ GI work-up, ENT visits. Albuterol inhaler helps some, but she isn't confident about technique and asks spacer.   01/18/19- Virtual Visit via Telephone Note  I connected with [name] [name] on 01/18/19 at  9:00 AM EST by telephone and verified that I am speaking with the correct person using two identifiers.  Location: Patient: H Provider: O   I discussed the limitations, risks, security and privacy concerns of performing an evaluation and management service by telephone and the availability of in person appointments. I also  discussed with the patient that there may be a patient responsible charge related to this service. The patient expressed understanding and agreed to proceed.   History of Present Illness: 89 yoF followed for OSA, complicated by  AFib/ amiodarone, HBP, Allergic Rhinitis,   GERD, Hypothyroid, Upper Airway Cough,  CPAP auto 5-15/ [facility] Download compliance 83%, AHI 1.9/ hr At [facility] we gave spacer for her ventolin rescue inhaler used for cough. She reports cough "90% improved" and not using either rescue or Bevespi. Hears some wheeze. Has f/u with TP next week for this issue. Gets anxious with CPAP. Says problem is the full-face mask, which also aggravates her Rosacea. Discussed download indicating satisfactory compliance and control.  Observations/Objective: Download compliance 83%, AHI 1.9/ hr  Assessment and Plan: OSA-  Benefits. Needs mask fitting for options.  Cough- improved, but has wheezing and not using inhalers. To keep planned appt.  Follow Up Instructions: 4 months   I discussed the assessment and treatment plan with the patient. The patient was provided an opportunity to ask questions and all were answered. The patient agreed with the plan and demonstrated an understanding of the instructions.   The patient was advised to call back or seek an in-person evaluation if the symptoms worsen or if the condition fails to improve as anticipated.  I provided 21 minutes of non-face-to-face time during this encounter.   Baird Lyons, MD      ROS-see HPI   + = positive Constitutional:    weight loss, night sweats, fevers, chills, +fatigue, lassitude. HEENT:    headaches, difficulty swallowing, tooth/dental problems, sore throat,       sneezing, itching, ear ache, nasal  congestion, post nasal drip, snoring CV:    chest pain, orthopnea, PND, swelling in lower extremities, anasarca,                                  dizziness, palpitations Resp:   shortness of breath  with exertion or at rest.                productive cough,   +non-productive cough, coughing up of blood.              change in color of mucus.  +wheezing.   Skin:    rash or lesions. GI:  No-   heartburn, indigestion, abdominal pain, nausea, vomiting, diarrhea,                 change in bowel habits, loss of appetite GU: dysuria, change in color of urine, no urgency or frequency.   flank pain. MS:   joint pain, stiffness, decreased range of motion, back pain. Neuro-     nothing unusual Psych:  change in mood or affect.  depression or anxiety.   memory loss.  OBJ- Physical Exam General- Alert, Oriented, Affect-appropriate, Distress- none acute, + obese Skin- rash-none, lesions- none, excoriation- none Lymphadenopathy- none Head- atraumatic            Eyes- Gross vision intact, PERRLA, conjunctivae and secretions clear            Ears- Hearing, canals-normal            Nose- Clear, no-Septal dev, mucus, polyps, erosion, perforation             Throat- Mallampati III , mucosa clear , drainage- none, tonsils- atrophic Neck- flexible , trachea midline, no stridor , thyroid nl, carotid no bruit Chest - symmetrical excursion , unlabored           Heart/CV- RRR , no murmur , no gallop  , no rub, nl s1 s2                           - JVD- none , edema- none, stasis changes- none, varices- none           Lung- + coarse , wheeze- none, cough- none , dullness-none, rub- none           Chest wall-  Abd-  Br/ Gen/ Rectal- Not done, not indicated Extrem- cyanosis- none, clubbing, none, atrophy- none, strength- nl Neuro- grossly intact to observation

## 2019-01-18 NOTE — Assessment & Plan Note (Signed)
Not relieved by inhalers or GERD therapy, and cough has improved. She says now she hears wheezing. She was already scheduled to follow-up this issue, so she will keep that appointment.

## 2019-01-23 ENCOUNTER — Other Ambulatory Visit: Payer: Self-pay

## 2019-01-23 ENCOUNTER — Ambulatory Visit: Payer: Medicare Other | Admitting: Adult Health

## 2019-01-23 DIAGNOSIS — G4733 Obstructive sleep apnea (adult) (pediatric): Secondary | ICD-10-CM

## 2019-01-23 NOTE — Addendum Note (Signed)
Addended by: Lorretta Harp on: 01/23/2019 12:41 PM   Modules accepted: Orders

## 2019-01-24 ENCOUNTER — Encounter: Payer: Self-pay | Admitting: Adult Health

## 2019-01-24 ENCOUNTER — Ambulatory Visit (INDEPENDENT_AMBULATORY_CARE_PROVIDER_SITE_OTHER): Payer: Medicare HMO | Admitting: Adult Health

## 2019-01-24 ENCOUNTER — Other Ambulatory Visit: Payer: Self-pay

## 2019-01-24 ENCOUNTER — Encounter: Payer: Self-pay | Admitting: *Deleted

## 2019-01-24 DIAGNOSIS — R053 Chronic cough: Secondary | ICD-10-CM

## 2019-01-24 DIAGNOSIS — J301 Allergic rhinitis due to pollen: Secondary | ICD-10-CM

## 2019-01-24 DIAGNOSIS — R05 Cough: Secondary | ICD-10-CM

## 2019-01-24 NOTE — Progress Notes (Signed)
Virtual Visit via Telephone Note  I connected with [name] [name] on 01/24/19 at  9:00 AM EST by telephone and verified that I am speaking with the correct person using two identifiers.  Location: Patient: Home  Provider: Office    I discussed the limitations, risks, security and privacy concerns of performing an evaluation and management service by telephone and the availability of in person appointments. I also discussed with the patient that there may be a patient responsible charge related to this service. The patient expressed understanding and agreed to proceed.   History of Present Illness: 74 year old female never smoker followed for chronic cough present since 2009 (worse since 2016) Medical history significant for A. fib on amiodarone (started February 2019) Obstructive sleep apnea  Today's televisit is a 3-monthfollow-up for chronic cough.  Patient has been followed for chronic cough since 2009.  She is had extensive work-up that is essentially been unrevealing.  High-resolution CT chest in March 2019 note showed no evidence of interstitial lung disease.  Allergy profile showed IgE, ESR and CBC with differential were normal.   She has been followed closely as she is on amiodarone which was started in February 2019.  Pulmonary function testing and 2020 showed mild to moderate restriction with FEV1 at 76% ratio 81, FVC 71% DLCO was 70% this was similar to 2018 PFT.  Since last visit patient says her cough has been under control.  She feels that it is substantially better than it has been over the last several years.  She continues to take Delsym and Tessalon on an as-needed basis.  She says she does use it on occasion.  But feels that her cough is more controlled.  She does use Allegra and Chlortab's for postnasal drainage.  Patient Active Problem List   Diagnosis Date Noted  . Hypothyroidism 04/04/2018  . Long term current use of amiodarone 10/19/2017  . OSA (obstructive sleep apnea)  10/19/2017  . Chronic rhinitis 06/16/2017  . Upper airway cough syndrome 03/19/2017  . Rhinitis, allergic 03/19/2017  . Severe recurrent major depression without psychotic features ([facility] 11/25/2016    Class: Chronic  . Generalized anxiety disorder 11/25/2016    Class: Chronic  . Persistent atrial fibrillation ([facility] 10/31/2016  . Pruritus 10/31/2016  . Primary localized osteoarthritis of left knee 09/21/2016  . (HFpEF) heart failure with preserved ejection fraction ([facility] 07/06/2016  . Chest pain 07/06/2016  . Fatigue 07/06/2016  . Dyspnea on exertion 04/03/2016  . Mitral regurgitation 02/27/2016  . Hyperlipidemia   . Essential hypertension   . Chronic cough   . Gastroesophageal reflux disease without esophagitis   . Bilateral carpal tunnel syndrome 11/06/2015  . Cervical spondylosis without myelopathy 11/06/2015  . Primary osteoarthritis of both first carpometacarpal joints 11/06/2015  . Hx of colonic polyps 05/24/2014  . S/P lumbar laminectomy 04/05/2013  . Benign neoplasm of colon 12/29/2011   Current Outpatient Medications on File Prior to Visit  Medication Sig Dispense Refill  . albuterol (VENTOLIN HFA) 108 (90 Base) MCG/ACT inhaler Inhale 1-2 puffs into the lungs every 4 (four) hours as needed for wheezing or shortness of breath. (Patient not taking: Reported on 01/18/2019) 1 g 1  . amiodarone (PACERONE) 200 MG tablet Take 1 tablet (200 mg total) by mouth daily. 90 tablet 3  . amLODipine (NORVASC) 5 MG tablet Take 1 tablet (5 mg total) by mouth daily. 90 tablet 3  . apixaban (ELIQUIS) 5 MG TABS tablet Take 1 tablet (5 mg total) by mouth  2 (two) times daily. 180 tablet 2  . Ascorbic Acid (VITAMIN C WITH ROSE HIPS) 1000 MG tablet Take 1,000 mg by mouth daily.    . benzonatate (TESSALON) 200 MG capsule Take 1 capsule by mouth three times daily as needed for cough 60 capsule 0  . buPROPion (WELLBUTRIN XL) 300 MG 24 hr tablet Take 1 tablet (300 mg total) by mouth daily. 90 tablet 0   . CALCIUM PO Take 1 tablet by mouth every morning.    . chlorpheniramine (CHLOR-TRIMETON) 4 MG tablet Take 4 mg by mouth at bedtime.    . Cholecalciferol (VITAMIN D) 2000 UNITS tablet Take 4,000 Units by mouth daily.    . CVS MELATONIN PO Take 1 tablet by mouth at bedtime as needed.    . fexofenadine (ALLEGRA) 180 MG tablet Take 180 mg by mouth daily as needed for allergies or rhinitis.    . furosemide (LASIX) 20 MG tablet Take 1 tablet (20 mg total) by mouth daily. 90 tablet 2  . Glycopyrrolate-Formoterol (BEVESPI AEROSPHERE) 9-4.8 MCG/ACT AERO Inhale 2 puffs into the lungs 2 (two) times daily. (Patient not taking: Reported on 01/18/2019) 5.9 g 0  . levothyroxine (SYNTHROID) 75 MCG tablet Take 1 tablet (75 mcg total) by mouth daily before breakfast. 90 tablet 3  . metoprolol succinate (TOPROL-XL) 25 MG 24 hr tablet TAKE 1/2 (ONE-HALF) TABLET BY MOUTH ONCE DAILY 45 tablet 3  . potassium chloride SA (KLOR-CON) 20 MEQ tablet Take 1 tablet (20 mEq total) by mouth daily. 90 tablet 3  . pravastatin (PRAVACHOL) 40 MG tablet Take 40 mg by mouth at bedtime.    Marland Kitchen Spacer/Aero-Holding Chambers (AEROCHAMBER MV) inhaler Use as instructed 1 each 0   No current facility-administered medications on file prior to visit.      Observations/Objective:  Chronic cough hx Neg CXR  ENT evaluation 2019 neg per pt  GI w/up neg for GERD per pt  Basement but does not use No hot tub use( has one but not functioning )  No chickens, birds, occupatonal /hobby exposure.  NoAutoimmune dz, no macrodantin use. On amiodarone 02/2017 PFT October 2018 showed FEV1 at 88%, ratio 80, FVC 83%, no significant bronchodilator response,mid flow reversibility, DLCO 71% CT chest March 26, 2017>negative for ILD, patchy air trapping in both lungs, thin parenchymal bands in the mid to lower lungs compatible with a mild postinfectious or postinflammatory scarring, 3 mm right lower lobe nodule. ESR March 2019 normal March 2019  allergy profile and IgE and CBC with differential normal  Assessment and Plan: Chronic cough-well-controlled on trigger prevention and cough control.  No changes  Plan  Patient Instructions   Mucinex Twice daily As needed  Only if you get thick mucus /congestion .  Delsym 2 tsp Twice daily  For cough As needed   Tessalon Three times a day  For cough As needed   Continue on Allegra 11m 1/2 to 1  daily in am .  Continue on Chlorpheniramine 456m1 At bedtime  Saline nasal rinses Twice daily As needed   Voice rest , sips of water if cough returns .  NO MINTS .  Ventolin Inhaler 2 puffs every 4-6 hrs as needed for wheezing .  Follow up in 6 months with Dr. WeMelvyn Novasnd Idalee Foxworthy NP  And As needed         Follow Up Instructions: Follow-up with Dr. WeMelvyn Novasn 6 months and as needed    I discussed the assessment and treatment plan with the  patient. The patient was provided an opportunity to ask questions and all were answered. The patient agreed with the plan and demonstrated an understanding of the instructions.   The patient was advised to call back or seek an in-person evaluation if the symptoms worsen or if the condition fails to improve as anticipated.  I provided 21  minutes of non-face-to-face time during this encounter.   Rexene Edison, NP

## 2019-01-24 NOTE — Patient Instructions (Signed)
Mucinex Twice daily As needed  Only if you get thick mucus /congestion .  Delsym 2 tsp Twice daily  For cough As needed   Tessalon Three times a day  For cough As needed   Continue on Allegra 180mg  1/2 to 1  daily in am .  Continue on Chlorpheniramine 4mg  1 At bedtime  Saline nasal rinses Twice daily As needed   Voice rest , sips of water if cough returns .  NO MINTS .  Ventolin Inhaler 2 puffs every 4-6 hrs as needed for wheezing .  Follow up in 6 months with Dr. Melvyn Novas and Parrett NP  And As needed

## 2019-02-02 DIAGNOSIS — M1711 Unilateral primary osteoarthritis, right knee: Secondary | ICD-10-CM | POA: Diagnosis not present

## 2019-02-06 DIAGNOSIS — G4733 Obstructive sleep apnea (adult) (pediatric): Secondary | ICD-10-CM | POA: Diagnosis not present

## 2019-02-07 DIAGNOSIS — G4733 Obstructive sleep apnea (adult) (pediatric): Secondary | ICD-10-CM | POA: Diagnosis not present

## 2019-02-10 DIAGNOSIS — I129 Hypertensive chronic kidney disease with stage 1 through stage 4 chronic kidney disease, or unspecified chronic kidney disease: Secondary | ICD-10-CM | POA: Diagnosis not present

## 2019-02-10 DIAGNOSIS — F331 Major depressive disorder, recurrent, moderate: Secondary | ICD-10-CM | POA: Diagnosis not present

## 2019-02-10 DIAGNOSIS — I480 Paroxysmal atrial fibrillation: Secondary | ICD-10-CM | POA: Diagnosis not present

## 2019-02-10 DIAGNOSIS — E785 Hyperlipidemia, unspecified: Secondary | ICD-10-CM | POA: Diagnosis not present

## 2019-02-11 ENCOUNTER — Other Ambulatory Visit (HOSPITAL_COMMUNITY)
Admission: RE | Admit: 2019-02-11 | Discharge: 2019-02-11 | Disposition: A | Discharge status: Home / Self Care | Payer: Medicare HMO | Source: Ambulatory Visit | Attending: Internal Medicine | Admitting: Internal Medicine

## 2019-02-11 DIAGNOSIS — Z01812 Encounter for preprocedural laboratory examination: Secondary | ICD-10-CM | POA: Insufficient documentation

## 2019-02-11 DIAGNOSIS — Z20822 Contact with and (suspected) exposure to covid-19: Secondary | ICD-10-CM | POA: Insufficient documentation

## 2019-02-11 LAB — SARS CORONAVIRUS 2 (TAT 6-24 HRS): SARS Coronavirus 2: NEGATIVE

## 2019-02-13 ENCOUNTER — Ambulatory Visit (HOSPITAL_BASED_OUTPATIENT_CLINIC_OR_DEPARTMENT_OTHER): Payer: Medicare HMO | Attending: Internal Medicine | Admitting: Internal Medicine

## 2019-02-13 ENCOUNTER — Other Ambulatory Visit: Payer: Self-pay

## 2019-02-13 DIAGNOSIS — G4733 Obstructive sleep apnea (adult) (pediatric): Secondary | ICD-10-CM

## 2019-02-16 DIAGNOSIS — F331 Major depressive disorder, recurrent, moderate: Secondary | ICD-10-CM | POA: Diagnosis not present

## 2019-02-16 DIAGNOSIS — E785 Hyperlipidemia, unspecified: Secondary | ICD-10-CM | POA: Diagnosis not present

## 2019-02-16 DIAGNOSIS — I480 Paroxysmal atrial fibrillation: Secondary | ICD-10-CM | POA: Diagnosis not present

## 2019-02-16 DIAGNOSIS — I129 Hypertensive chronic kidney disease with stage 1 through stage 4 chronic kidney disease, or unspecified chronic kidney disease: Secondary | ICD-10-CM | POA: Diagnosis not present

## 2019-02-17 DIAGNOSIS — N183 Chronic kidney disease, stage 3 unspecified: Secondary | ICD-10-CM | POA: Diagnosis not present

## 2019-02-17 DIAGNOSIS — R7303 Prediabetes: Secondary | ICD-10-CM | POA: Diagnosis not present

## 2019-02-17 DIAGNOSIS — G4730 Sleep apnea, unspecified: Secondary | ICD-10-CM | POA: Diagnosis not present

## 2019-02-17 DIAGNOSIS — F331 Major depressive disorder, recurrent, moderate: Secondary | ICD-10-CM | POA: Diagnosis not present

## 2019-02-17 DIAGNOSIS — E785 Hyperlipidemia, unspecified: Secondary | ICD-10-CM | POA: Diagnosis not present

## 2019-02-17 DIAGNOSIS — I129 Hypertensive chronic kidney disease with stage 1 through stage 4 chronic kidney disease, or unspecified chronic kidney disease: Secondary | ICD-10-CM | POA: Diagnosis not present

## 2019-02-17 DIAGNOSIS — I700 Atherosclerosis of aorta: Secondary | ICD-10-CM | POA: Diagnosis not present

## 2019-02-17 DIAGNOSIS — I480 Paroxysmal atrial fibrillation: Secondary | ICD-10-CM | POA: Diagnosis not present

## 2019-02-17 DIAGNOSIS — Z0000 Encounter for general adult medical examination without abnormal findings: Secondary | ICD-10-CM | POA: Diagnosis not present

## 2019-02-21 DIAGNOSIS — R142 Eructation: Secondary | ICD-10-CM | POA: Diagnosis not present

## 2019-02-21 DIAGNOSIS — R109 Unspecified abdominal pain: Secondary | ICD-10-CM | POA: Diagnosis not present

## 2019-02-23 ENCOUNTER — Other Ambulatory Visit: Payer: Self-pay | Admitting: Physician Assistant

## 2019-02-23 DIAGNOSIS — R109 Unspecified abdominal pain: Secondary | ICD-10-CM

## 2019-02-26 ENCOUNTER — Ambulatory Visit: Payer: Medicare HMO | Attending: Internal Medicine

## 2019-02-26 DIAGNOSIS — Z23 Encounter for immunization: Secondary | ICD-10-CM | POA: Insufficient documentation

## 2019-02-26 NOTE — Progress Notes (Signed)
   Covid-19 Vaccination Clinic  Name:  [name] [name]    MRN: [id number] DOB: February 11, 1945  02/26/2019  [name] [name] was observed post Covid-19 immunization for 15 minutes without incidence. She was provided with Vaccine Information Sheet and instruction to access the V-Safe system.   [name] [name] was instructed to call 911 with any severe reactions post vaccine: Marland Kitchen Difficulty breathing  . Swelling of your face and throat  . A fast heartbeat  . A bad rash all over your body  . Dizziness and weakness    Immunizations Administered    Name Date Dose VIS Date Route   Pfizer COVID-19 Vaccine 02/26/2019 10:03 AM 0.3 mL 12/23/2018 Intramuscular   Manufacturer: [place]   Lot: [id number]   [place]: [id number]

## 2019-03-06 DIAGNOSIS — R109 Unspecified abdominal pain: Secondary | ICD-10-CM | POA: Diagnosis not present

## 2019-03-08 DIAGNOSIS — K219 Gastro-esophageal reflux disease without esophagitis: Secondary | ICD-10-CM | POA: Diagnosis not present

## 2019-03-08 DIAGNOSIS — Z800 Family history of malignant neoplasm of digestive organs: Secondary | ICD-10-CM | POA: Diagnosis not present

## 2019-03-10 DIAGNOSIS — G4733 Obstructive sleep apnea (adult) (pediatric): Secondary | ICD-10-CM | POA: Diagnosis not present

## 2019-03-15 ENCOUNTER — Ambulatory Visit: Payer: Medicare HMO | Attending: Internal Medicine

## 2019-03-15 ENCOUNTER — Other Ambulatory Visit: Payer: Self-pay | Admitting: Adult Health

## 2019-03-15 ENCOUNTER — Other Ambulatory Visit: Payer: Self-pay

## 2019-03-15 DIAGNOSIS — Z20822 Contact with and (suspected) exposure to covid-19: Secondary | ICD-10-CM | POA: Diagnosis not present

## 2019-03-16 LAB — NOVEL CORONAVIRUS, NAA: SARS-CoV-2, NAA: NOT DETECTED

## 2019-03-21 ENCOUNTER — Ambulatory Visit: Payer: Medicare HMO

## 2019-03-22 DIAGNOSIS — F419 Anxiety disorder, unspecified: Secondary | ICD-10-CM | POA: Diagnosis not present

## 2019-03-22 DIAGNOSIS — J4521 Mild intermittent asthma with (acute) exacerbation: Secondary | ICD-10-CM | POA: Diagnosis not present

## 2019-03-22 DIAGNOSIS — R7303 Prediabetes: Secondary | ICD-10-CM | POA: Diagnosis not present

## 2019-03-22 DIAGNOSIS — I129 Hypertensive chronic kidney disease with stage 1 through stage 4 chronic kidney disease, or unspecified chronic kidney disease: Secondary | ICD-10-CM | POA: Diagnosis not present

## 2019-03-22 DIAGNOSIS — R142 Eructation: Secondary | ICD-10-CM | POA: Diagnosis not present

## 2019-03-22 DIAGNOSIS — N183 Chronic kidney disease, stage 3 unspecified: Secondary | ICD-10-CM | POA: Diagnosis not present

## 2019-03-22 DIAGNOSIS — F331 Major depressive disorder, recurrent, moderate: Secondary | ICD-10-CM | POA: Diagnosis not present

## 2019-04-05 ENCOUNTER — Ambulatory Visit: Payer: Medicare HMO | Attending: Internal Medicine

## 2019-04-05 DIAGNOSIS — Z23 Encounter for immunization: Secondary | ICD-10-CM

## 2019-04-05 NOTE — Progress Notes (Signed)
   Covid-19 Vaccination Clinic  Name:  [name] [name]    MRN: [id number] DOB: 07/27/1945  04/05/2019  [name] [name] was observed post Covid-19 immunization for 15 minutes without incident. She was provided with Vaccine Information Sheet and instruction to access the V-Safe system.   [name] [name] was instructed to call 911 with any severe reactions post vaccine: Marland Kitchen Difficulty breathing  . Swelling of face and throat  . A fast heartbeat  . A bad rash all over body  . Dizziness and weakness   Immunizations Administered    Name Date Dose VIS Date Route   Pfizer COVID-19 Vaccine 04/05/2019  8:37 AM 0.3 mL 12/23/2018 Intramuscular   Manufacturer: [place]   Lot: [id number]   [place]: [id number]

## 2019-04-06 ENCOUNTER — Telehealth: Payer: Self-pay | Admitting: *Deleted

## 2019-04-06 NOTE — Telephone Encounter (Signed)
   [facility] Pre-operative Risk Assessment    Request for surgical clearance:  1. What type of surgery is being performed? COLONOSCOPY   2. When is this surgery scheduled? 06/07/19   3. What type of clearance is required (medical clearance vs. Pharmacy clearance to hold med vs. Both)? BOTH  4. Are there any medications that need to be held prior to surgery and how long? ELIQUIS   5. Practice name and name of physician performing surgery? [facility]; DR. Michail Sermon   6. What is your office phone number [phone]    7.   What is your office fax number [phone]  8.   Anesthesia type (None, local, MAC, general) ? NOT LISTED; PROPOFOL?   [name] 04/06/2019, 2:08 PM  _________________________________________________________________   (provider comments below)

## 2019-04-06 NOTE — Telephone Encounter (Signed)
Patient with diagnosis of ATRIAL FIBRILLATION  on ELIQUIS  for anticoagulation.    Procedure: COLONOSCOPY Date of procedure: 06/07/2019  CHADS2-VASc score of  4 (CHF, HTN, AGE,  Female) *No hx of stroke/VTE noted*  CrCl = 73 ML/MIN (Scr = 1.45  On 03/22/2019) Platelet count = 185  Per office protocol, patient can hold ELIQUIS for 2 days prior to procedure.

## 2019-04-07 DIAGNOSIS — G4733 Obstructive sleep apnea (adult) (pediatric): Secondary | ICD-10-CM | POA: Diagnosis not present

## 2019-04-07 NOTE — Telephone Encounter (Signed)
   Primary Cardiologist: Candee Furbish, MD  Chart reviewed as part of pre-operative protocol coverage. Patient was contacted 04/07/2019 in reference to pre-operative risk assessment for pending surgery as outlined below.  [name] [name] was last seen on 11/01/18 by Dr. Marlou Porch.  Since that day, [name] [name] has done well with no new cardiac complaints.  Therefore, based on ACC/AHA guidelines, the patient would be at acceptable risk for the planned procedure without further cardiovascular testing.   According to our pharmacy protocol: Patient with diagnosis of ATRIAL FIBRILLATION  on ELIQUIS  for anticoagulation.    Procedure: COLONOSCOPY Date of procedure: 06/07/2019  CHADS2-VASc score of  4 (CHF, HTN, AGE,  Female) *No hx of stroke/VTE noted*  CrCl = 73 ML/MIN (Scr = 1.45  On 03/22/2019) Platelet count = 185  Per office protocol, patient can hold ELIQUIS for 2 days prior to procedure.      I will route this recommendation to the requesting party via Epic fax function and remove from pre-op pool.  Please call with questions.  Daune Perch, NP 04/07/2019, 9:10 AM

## 2019-05-08 DIAGNOSIS — G4733 Obstructive sleep apnea (adult) (pediatric): Secondary | ICD-10-CM | POA: Diagnosis not present

## 2019-05-18 ENCOUNTER — Ambulatory Visit: Payer: Medicare Other | Admitting: Internal Medicine

## 2019-05-20 ENCOUNTER — Other Ambulatory Visit: Payer: Self-pay | Admitting: Adult Health

## 2019-06-07 DIAGNOSIS — G4733 Obstructive sleep apnea (adult) (pediatric): Secondary | ICD-10-CM | POA: Diagnosis not present

## 2019-06-19 DIAGNOSIS — M79671 Pain in right foot: Secondary | ICD-10-CM | POA: Diagnosis not present

## 2019-06-19 DIAGNOSIS — L989 Disorder of the skin and subcutaneous tissue, unspecified: Secondary | ICD-10-CM | POA: Diagnosis not present

## 2019-07-03 DIAGNOSIS — X32XXXD Exposure to sunlight, subsequent encounter: Secondary | ICD-10-CM | POA: Diagnosis not present

## 2019-07-03 DIAGNOSIS — D0462 Carcinoma in situ of skin of left upper limb, including shoulder: Secondary | ICD-10-CM | POA: Diagnosis not present

## 2019-07-03 DIAGNOSIS — L570 Actinic keratosis: Secondary | ICD-10-CM | POA: Diagnosis not present

## 2019-07-08 DIAGNOSIS — G4733 Obstructive sleep apnea (adult) (pediatric): Secondary | ICD-10-CM | POA: Diagnosis not present

## 2019-07-12 DIAGNOSIS — M898X7 Other specified disorders of bone, ankle and foot: Secondary | ICD-10-CM | POA: Diagnosis not present

## 2019-07-12 DIAGNOSIS — M792 Neuralgia and neuritis, unspecified: Secondary | ICD-10-CM | POA: Diagnosis not present

## 2019-07-12 DIAGNOSIS — M79671 Pain in right foot: Secondary | ICD-10-CM | POA: Diagnosis not present

## 2019-07-12 DIAGNOSIS — M65871 Other synovitis and tenosynovitis, right ankle and foot: Secondary | ICD-10-CM | POA: Diagnosis not present

## 2019-07-12 DIAGNOSIS — M65872 Other synovitis and tenosynovitis, left ankle and foot: Secondary | ICD-10-CM | POA: Diagnosis not present

## 2019-07-18 ENCOUNTER — Encounter: Payer: Self-pay | Admitting: Endocrinology

## 2019-07-18 ENCOUNTER — Other Ambulatory Visit: Payer: Self-pay

## 2019-07-18 ENCOUNTER — Ambulatory Visit (INDEPENDENT_AMBULATORY_CARE_PROVIDER_SITE_OTHER): Payer: Medicare HMO | Admitting: Endocrinology

## 2019-07-18 VITALS — BP 120/66 | HR 61 | Ht 67.0 in | Wt 242.2 lb

## 2019-07-18 DIAGNOSIS — E032 Hypothyroidism due to medicaments and other exogenous substances: Secondary | ICD-10-CM | POA: Diagnosis not present

## 2019-07-18 NOTE — Progress Notes (Signed)
Subjective:    Patient ID: [name] [name], female    DOB: 05-17-1945, 74 y.o.   MRN: [id number]  HPI Pt returns for f/u of hypothyroidism (she has been on amiodarone since 2018; hypothyroidism was dx'ed in late 2019, when she was rx'ed synthroid; she has never had thyroid imaging).  She takes synthroid as rx'ed.  pt states she feels well in general.   Past Medical History:  Diagnosis Date  . Anxiety   . Arthritis   . Cataracts, bilateral    immature  . CKD (chronic kidney disease) stage 3, GFR 30-59 ml/min   . Depression   . GERD (gastroesophageal reflux disease)    was on Nexium   . GERD (gastroesophageal reflux disease)   . Hemorrhoid   . History of bronchitis    couple of yrs ago  . History of colon polyps   . History of peristent atrial fibrillation   . Hyperlipidemia    takes Pravastatin daily  . Hypertension    takes Amlodipine and Diovan daily  . Hypertension   . Pre-diabetes   . Sleep apnea    no cpap used  . Sleep apnea    study done 20+yrs ago and doesn't use cpap  . Urinary urgency   . Weakness    in left leg r/t back    Past Surgical History:  Procedure Laterality Date  . ABDOMINAL HYSTERECTOMY  1980   partial  . ABDOMINAL HYSTERECTOMY     partial  . BACK SURGERY    . CARDIOVERSION N/A 02/24/2016   Procedure: CARDIOVERSION;  Surgeon: Fay Records, MD;  Location: [facility] ENDOSCOPY;  Service: Cardiovascular;  Laterality: N/A;  . CARDIOVERSION N/A 03/19/2016   Procedure: CARDIOVERSION;  Surgeon: Fay Records, MD;  Location: [address];  Service: Cardiovascular;  Laterality: N/A;  . CARPAL TUNNEL RELEASE    . CHOLECYSTECTOMY  1975  . CHOLECYSTECTOMY    . COLONOSCOPY    . COLONOSCOPY WITH PROPOFOL  12/29/2011   Procedure: COLONOSCOPY WITH PROPOFOL;  Surgeon: Lear Ng, MD;  Location: [facility] ENDOSCOPY;  Service: Endoscopy;  Laterality: N/A;  . COLONOSCOPY WITH PROPOFOL N/A 05/24/2014   Procedure: COLONOSCOPY WITH PROPOFOL;  Surgeon: Wilford Corner, MD;   Location: [facility] ENDOSCOPY;  Service: Endoscopy;  Laterality: N/A;  . ESOPHAGEAL MANOMETRY N/A 02/22/2017   Procedure: ESOPHAGEAL MANOMETRY (EM);  Surgeon: Wilford Corner, MD;  Location: [facility] ENDOSCOPY;  Service: Endoscopy;  Laterality: N/A;  . ESOPHAGOGASTRODUODENOSCOPY    . FOOT SURGERY     left, right  . FRACTURE SURGERY     left leg-knee  . HEEL SPUR EXCISION Bilateral   . HOT HEMOSTASIS  12/29/2011   Procedure: HOT HEMOSTASIS (ARGON PLASMA COAGULATION/BICAP);  Surgeon: Lear Ng, MD;  Location: [facility];  Service: Endoscopy;  Laterality: N/A;  . HOT HEMOSTASIS N/A 05/24/2014   Procedure: HOT HEMOSTASIS (ARGON PLASMA COAGULATION/BICAP);  Surgeon: Wilford Corner, MD;  Location: [facility] ENDOSCOPY;  Service: Endoscopy;  Laterality: N/A;  . leg surgery d/t break Left   . LUMBAR LAMINECTOMY/DECOMPRESSION MICRODISCECTOMY Left 04/05/2013   Procedure: LUMBAR FOUR TO FIVE LUMBAR LAMINECTOMY/DECOMPRESSION MICRODISCECTOMY 1 LEVEL;  Surgeon: Eustace Moore, MD;  Location: [place] NEURO ORS;  Service: Neurosurgery;  Laterality: Left;  . NISSEN FUNDOPLICATION    . [place] IMPEDANCE STUDY N/A 02/22/2017   Procedure: [place] IMPEDANCE STUDY;  Surgeon: Wilford Corner, MD;  Location: [facility] ENDOSCOPY;  Service: Endoscopy;  Laterality: N/A;  . TEE WITHOUT CARDIOVERSION N/A 02/24/2016   Procedure: TRANSESOPHAGEAL ECHOCARDIOGRAM (  TEE);  Surgeon: Fay Records, MD;  Location: [place];  Service: Cardiovascular;  Laterality: N/A;  . TOTAL KNEE ARTHROPLASTY Left 10/07/2016  . TOTAL KNEE ARTHROPLASTY Left 10/07/2016   Procedure: TOTAL KNEE ARTHROPLASTY;  Surgeon: [name], MD;  Location: [place];  Service: Orthopedics;  Laterality: Left;    Social History   Socioeconomic History  . Marital status: Married    Spouse name: Dominica Severin  . Number of children: 2  . Years of education: 66  . Highest education level: Some college, no degree  Occupational History  . Not on file  Tobacco Use  . Smoking status: Never  Smoker  . Smokeless tobacco: Never Used  Vaping Use  . Vaping Use: Never used  Substance and Sexual Activity  . Alcohol use: No  . Drug use: No  . Sexual activity: Yes    Birth control/protection: Surgical  Other Topics Concern  . Not on file  Social History Narrative   ** Merged History Encounter **       Social Determinants of Health   Financial Resource Strain:   . Difficulty of Paying Living Expenses:   Food Insecurity:   . Worried About Charity fundraiser in the Last Year:   . Arboriculturist in the Last Year:   Transportation Needs:   . Film/video editor (Medical):   Marland Kitchen Lack of Transportation (Non-Medical):   Physical Activity:   . Days of Exercise per Week:   . Minutes of Exercise per Session:   Stress:   . Feeling of Stress :   Social Connections:   . Frequency of Communication with Friends and Family:   . Frequency of Social Gatherings with Friends and Family:   . Attends Religious Services:   . Active Member of Clubs or Organizations:   . Attends Archivist Meetings:   Marland Kitchen Marital Status:   Intimate Partner Violence:   . Fear of Current or Ex-Partner:   . Emotionally Abused:   Marland Kitchen Physically Abused:   . Sexually Abused:     Current Outpatient Medications on File Prior to Visit  Medication Sig Dispense Refill  . albuterol (VENTOLIN HFA) 108 (90 Base) MCG/ACT inhaler Inhale 1-2 puffs into the lungs every 4 (four) hours as needed for wheezing or shortness of breath. 1 g 1  . amiodarone (PACERONE) 200 MG tablet Take 1 tablet (200 mg total) by mouth daily. 90 tablet 3  . amLODipine (NORVASC) 5 MG tablet Take 1 tablet (5 mg total) by mouth daily. 90 tablet 3  . apixaban (ELIQUIS) 5 MG TABS tablet Take 1 tablet (5 mg total) by mouth 2 (two) times daily. 180 tablet 2  . Ascorbic Acid (VITAMIN C WITH ROSE HIPS) 1000 MG tablet Take 1,000 mg by mouth daily.    . benzonatate (TESSALON) 200 MG capsule Take 1 capsule by mouth three times daily as needed for  cough 60 capsule 0  . buPROPion (WELLBUTRIN XL) 300 MG 24 hr tablet Take 1 tablet (300 mg total) by mouth daily. 90 tablet 0  . CALCIUM PO Take 1 tablet by mouth every morning.    . chlorpheniramine (CHLOR-TRIMETON) 4 MG tablet Take 4 mg by mouth at bedtime.    . Cholecalciferol (VITAMIN D) 2000 UNITS tablet Take 4,000 Units by mouth daily.    . [employer] MELATONIN PO Take 1 tablet by mouth at bedtime as needed.    . fexofenadine (ALLEGRA) 180 MG tablet Take 180 mg by mouth  daily as needed for allergies or rhinitis.    . furosemide (LASIX) 20 MG tablet Take 1 tablet (20 mg total) by mouth daily. 90 tablet 2  . Glycopyrrolate-Formoterol (BEVESPI AEROSPHERE) 9-4.8 MCG/ACT AERO Inhale 2 puffs into the lungs 2 (two) times daily. 5.9 g 0  . levothyroxine (SYNTHROID) 75 MCG tablet Take 1 tablet (75 mcg total) by mouth daily before breakfast. 90 tablet 3  . metoprolol succinate (TOPROL-XL) 25 MG 24 hr tablet TAKE 1/2 (ONE-HALF) TABLET BY MOUTH ONCE DAILY 45 tablet 3  . potassium chloride SA (KLOR-CON) 20 MEQ tablet Take 1 tablet (20 mEq total) by mouth daily. 90 tablet 3  . pravastatin (PRAVACHOL) 40 MG tablet Take 40 mg by mouth at bedtime.    Marland Kitchen Spacer/Aero-Holding Chambers (AEROCHAMBER MV) inhaler Use as instructed 1 each 0   No current facility-administered medications on file prior to visit.    Allergies  Allergen Reactions  . Lexapro [Escitalopram]     Makes her feel funny  . Peroxide [Hydrogen Peroxide] Other (See Comments)    Redness.   [place] [Sertraline Hcl]     Makes her feel funny  . Neosporin [Neomycin-Bacitracin Zn-Polymyx] Rash    Blisters, itching.     Family History  Problem Relation Age of Onset  . Colon cancer Mother   . Heart disease Father   . Breast cancer Neg Hx   . Thyroid disease Neg Hx     BP 120/66   Pulse 61   Ht 5\' 7"  (1.702 m)   Wt 242 lb 3.2 oz (109.9 kg)   SpO2 99%   BMI 37.93 kg/m    Review of Systems Denies neck swelling    Objective:    Physical Exam VITAL SIGNS:  See vs page GENERAL: no distress NECK: There is no palpable thyroid enlargement.  No thyroid nodule is palpable.  No palpable lymphadenopathy at the anterior neck.      Assessment & Plan:  Hypothyroidism: recheck today CAF: we discussed.  I told pt she should not d/c amiodarone on account of the thyroid.   Patient Instructions  Blood tests are requested for you today.  We'll let you know about the results.  It is best to never miss the medication.  However, if you do miss it, next best is to double up the next time. There is no need to stop the amiodarone.  We'll work around that, as you need it for your heart rhythm. Please come back for a follow-up appointment in 6 months.

## 2019-07-18 NOTE — Patient Instructions (Addendum)
Blood tests are requested for you today.  We'll let you know about the results.  It is best to never miss the medication.  However, if you do miss it, next best is to double up the next time. There is no need to stop the amiodarone.  We'll work around that, as you need it for your heart rhythm. Please come back for a follow-up appointment in 6 months.

## 2019-07-27 DIAGNOSIS — M1712 Unilateral primary osteoarthritis, left knee: Secondary | ICD-10-CM | POA: Diagnosis not present

## 2019-08-01 DIAGNOSIS — R42 Dizziness and giddiness: Secondary | ICD-10-CM | POA: Insufficient documentation

## 2019-08-01 DIAGNOSIS — R2681 Unsteadiness on feet: Secondary | ICD-10-CM

## 2019-08-01 DIAGNOSIS — H903 Sensorineural hearing loss, bilateral: Secondary | ICD-10-CM

## 2019-08-01 HISTORY — DX: Dizziness and giddiness: R42

## 2019-08-01 HISTORY — DX: Unsteadiness on feet: R26.81

## 2019-08-01 HISTORY — DX: Sensorineural hearing loss, bilateral: H90.3

## 2019-08-07 DIAGNOSIS — G4733 Obstructive sleep apnea (adult) (pediatric): Secondary | ICD-10-CM | POA: Diagnosis not present

## 2019-08-14 DIAGNOSIS — M65872 Other synovitis and tenosynovitis, left ankle and foot: Secondary | ICD-10-CM | POA: Diagnosis not present

## 2019-08-14 DIAGNOSIS — M65871 Other synovitis and tenosynovitis, right ankle and foot: Secondary | ICD-10-CM | POA: Diagnosis not present

## 2019-08-14 DIAGNOSIS — M898X7 Other specified disorders of bone, ankle and foot: Secondary | ICD-10-CM | POA: Diagnosis not present

## 2019-08-14 DIAGNOSIS — M792 Neuralgia and neuritis, unspecified: Secondary | ICD-10-CM | POA: Diagnosis not present

## 2019-08-16 DIAGNOSIS — Z85828 Personal history of other malignant neoplasm of skin: Secondary | ICD-10-CM | POA: Diagnosis not present

## 2019-08-16 DIAGNOSIS — Z08 Encounter for follow-up examination after completed treatment for malignant neoplasm: Secondary | ICD-10-CM | POA: Diagnosis not present

## 2019-08-16 DIAGNOSIS — L570 Actinic keratosis: Secondary | ICD-10-CM | POA: Diagnosis not present

## 2019-08-16 DIAGNOSIS — X32XXXD Exposure to sunlight, subsequent encounter: Secondary | ICD-10-CM | POA: Diagnosis not present

## 2019-09-04 DIAGNOSIS — L308 Other specified dermatitis: Secondary | ICD-10-CM | POA: Diagnosis not present

## 2019-09-05 DIAGNOSIS — M19012 Primary osteoarthritis, left shoulder: Secondary | ICD-10-CM | POA: Diagnosis not present

## 2019-09-07 DIAGNOSIS — G4733 Obstructive sleep apnea (adult) (pediatric): Secondary | ICD-10-CM | POA: Diagnosis not present

## 2019-09-25 DIAGNOSIS — N183 Chronic kidney disease, stage 3 unspecified: Secondary | ICD-10-CM | POA: Diagnosis not present

## 2019-09-25 DIAGNOSIS — Z23 Encounter for immunization: Secondary | ICD-10-CM | POA: Diagnosis not present

## 2019-09-25 DIAGNOSIS — E785 Hyperlipidemia, unspecified: Secondary | ICD-10-CM | POA: Diagnosis not present

## 2019-09-25 DIAGNOSIS — I129 Hypertensive chronic kidney disease with stage 1 through stage 4 chronic kidney disease, or unspecified chronic kidney disease: Secondary | ICD-10-CM | POA: Diagnosis not present

## 2019-09-25 DIAGNOSIS — E032 Hypothyroidism due to medicaments and other exogenous substances: Secondary | ICD-10-CM | POA: Diagnosis not present

## 2019-09-25 DIAGNOSIS — R296 Repeated falls: Secondary | ICD-10-CM | POA: Diagnosis not present

## 2019-09-25 DIAGNOSIS — F419 Anxiety disorder, unspecified: Secondary | ICD-10-CM | POA: Diagnosis not present

## 2019-09-25 DIAGNOSIS — F331 Major depressive disorder, recurrent, moderate: Secondary | ICD-10-CM | POA: Diagnosis not present

## 2019-09-25 DIAGNOSIS — R7303 Prediabetes: Secondary | ICD-10-CM | POA: Diagnosis not present

## 2019-09-25 LAB — TSH: TSH: 0.65 (ref 0.41–5.90)

## 2019-09-26 ENCOUNTER — Encounter: Payer: Self-pay | Admitting: Neurology

## 2019-10-05 ENCOUNTER — Encounter: Payer: Self-pay | Admitting: Endocrinology

## 2019-10-08 DIAGNOSIS — G4733 Obstructive sleep apnea (adult) (pediatric): Secondary | ICD-10-CM | POA: Diagnosis not present

## 2019-10-11 ENCOUNTER — Other Ambulatory Visit: Payer: Self-pay | Admitting: Family Medicine

## 2019-10-11 DIAGNOSIS — Z1231 Encounter for screening mammogram for malignant neoplasm of breast: Secondary | ICD-10-CM

## 2019-10-21 ENCOUNTER — Other Ambulatory Visit: Payer: Self-pay | Admitting: Cardiology

## 2019-10-23 NOTE — Telephone Encounter (Signed)
Pt last saw Dr Marlou Porch 11/01/18, pt has upcoming appt on 11/02/19 with Dr Marlou Porch.  Last labs 09/25/19 Creat 1.44 at [facility] per [facility], age 74, weight 109.9kg, based on specified criteria pt is on appropriate dosage of Eliquis 5mg  BID.  Will refill rx.

## 2019-10-26 ENCOUNTER — Telehealth: Payer: Self-pay | Admitting: *Deleted

## 2019-10-26 MED ORDER — POTASSIUM CHLORIDE ER 10 MEQ PO CPCR: 20.0000 meq | ORAL_CAPSULE | Freq: Every day | ORAL | 11 refills | Status: DC

## 2019-10-26 NOTE — Telephone Encounter (Signed)
Jerline Pain, MD  [employer] [facility] 3 hours ago (9:36 AM)   I will asked my nursing team to see if they can obtain this in a powder form. More to come. I understand how big these pills can be.  Candee Furbish, MD   Pt's insurance will not cover for powder or granules.  Will attempt with capsules.

## 2019-10-30 DIAGNOSIS — N189 Chronic kidney disease, unspecified: Secondary | ICD-10-CM | POA: Diagnosis not present

## 2019-10-30 DIAGNOSIS — N183 Chronic kidney disease, stage 3 unspecified: Secondary | ICD-10-CM | POA: Diagnosis not present

## 2019-11-02 ENCOUNTER — Encounter: Payer: Self-pay | Admitting: Cardiology

## 2019-11-02 ENCOUNTER — Other Ambulatory Visit: Payer: Self-pay

## 2019-11-02 ENCOUNTER — Ambulatory Visit: Payer: Medicare HMO | Admitting: Cardiology

## 2019-11-02 VITALS — BP 160/70 | HR 53 | Ht 67.0 in | Wt 239.0 lb

## 2019-11-02 DIAGNOSIS — I4819 Other persistent atrial fibrillation: Secondary | ICD-10-CM

## 2019-11-02 DIAGNOSIS — Z79899 Other long term (current) drug therapy: Secondary | ICD-10-CM

## 2019-11-02 DIAGNOSIS — R0600 Dyspnea, unspecified: Secondary | ICD-10-CM | POA: Diagnosis not present

## 2019-11-02 DIAGNOSIS — I10 Essential (primary) hypertension: Secondary | ICD-10-CM | POA: Diagnosis not present

## 2019-11-02 DIAGNOSIS — R0609 Other forms of dyspnea: Secondary | ICD-10-CM

## 2019-11-02 DIAGNOSIS — I340 Nonrheumatic mitral (valve) insufficiency: Secondary | ICD-10-CM | POA: Diagnosis not present

## 2019-11-02 LAB — CBC
Hematocrit: 41.7 % (ref 34.0–46.6)
Hemoglobin: 13.7 g/dL (ref 11.1–15.9)
MCH: 31.5 pg (ref 26.6–33.0)
MCHC: 32.9 g/dL (ref 31.5–35.7)
MCV: 96 fL (ref 79–97)
Platelets: 200 10*3/uL (ref 150–450)
RBC: 4.35 x10E6/uL (ref 3.77–5.28)
RDW: 13.2 % (ref 11.7–15.4)
WBC: 5.8 10*3/uL (ref 3.4–10.8)

## 2019-11-02 LAB — ALT: ALT: 22 IU/L (ref 0–32)

## 2019-11-02 MED ORDER — AMIODARONE HCL 200 MG PO TABS: 100.0000 mg | ORAL_TABLET | Freq: Every day | ORAL | 3 refills | Status: DC

## 2019-11-02 NOTE — Patient Instructions (Signed)
Medication Instructions:  Please decrease Amiodarone to 100 mg a day.   Continue all other mediations as listed.  *If you need a refill on your cardiac medications before your next appointment, please call your pharmacy*  Lab Work: Please have blood work today (CBC, ALT)  If you have labs (blood work) drawn today and your tests are completely normal, you will receive your results only by:  MyChart Message (if you have MyChart) OR  A paper copy in the mail If you have any lab test that is abnormal or we need to change your treatment, we will call you to review the results.  Follow-Up: At [facility], you and your health needs are our priority.  As part of our continuing mission to provide you with exceptional heart care, we have created designated Provider Care Teams.  These Care Teams include your primary Cardiologist (physician) and Advanced Practice Providers (APPs -  Physician Assistants and Nurse Practitioners) who all work together to provide you with the care you need, when you need it.  We recommend signing up for the patient portal called "MyChart".  Sign up information is provided on this After Visit Summary.  MyChart is used to connect with patients for Virtual Visits (Telemedicine).  Patients are able to view lab/test results, encounter notes, upcoming appointments, etc.  Non-urgent messages can be sent to your provider as well.   To learn more about what you can do with MyChart, go to [email or web address].    Your next appointment:   6 month(s)  The format for your next appointment:   In Person  Provider:   Candee Furbish, MD   Thank you for choosing [facility]!!

## 2019-11-02 NOTE — Progress Notes (Signed)
Cardiology Office Note:    Date:  11/02/2019   ID:  [name] [name], DOB Dec 20, 1945, MRN [id number]  PCP:  Harlan Stains, MD  [facility] Cardiologist:  Candee Furbish, MD  [facility] Electrophysiologist:  None   Referring MD: Harlan Stains, MD     History of Present Illness:    [name] [name] is a 74 y.o. female here for the follow-up of paroxysmal atrial fibrillation.  Has been on medication management with amiodarone and Eliquis.  No bleeding no side effects.  Pulmonary medicine has been following her with her amiodarone.  At last office visit her EKG was reviewed and demonstrated normal sinus rhythm at rate 66 bpm.  Notes tremor, essential-like tremor.  Hyperpigmentation of LE noted below the knees.  Denies any syncope bleeding orthopnea PND.  Past Medical History:  Diagnosis Date  . Anxiety   . Arthritis   . Cataracts, bilateral    immature  . CKD (chronic kidney disease) stage 3, GFR 30-59 ml/min (HCC)   . Depression   . GERD (gastroesophageal reflux disease)    was on Nexium   . GERD (gastroesophageal reflux disease)   . Hemorrhoid   . History of bronchitis    couple of yrs ago  . History of colon polyps   . History of peristent atrial fibrillation   . Hyperlipidemia    takes Pravastatin daily  . Hypertension    takes Amlodipine and Diovan daily  . Hypertension   . Pre-diabetes   . Sleep apnea    no cpap used  . Sleep apnea    study done 20+yrs ago and doesn't use cpap  . Urinary urgency   . Weakness    in left leg r/t back    Past Surgical History:  Procedure Laterality Date  . ABDOMINAL HYSTERECTOMY  1980   partial  . ABDOMINAL HYSTERECTOMY     partial  . BACK SURGERY    . CARDIOVERSION N/A 02/24/2016   Procedure: CARDIOVERSION;  Surgeon: Fay Records, MD;  Location: [facility] ENDOSCOPY;  Service: Cardiovascular;  Laterality: N/A;  . CARDIOVERSION N/A 03/19/2016   Procedure: CARDIOVERSION;  Surgeon: Fay Records, MD;  Location: [place];  Service:  Cardiovascular;  Laterality: N/A;  . CARPAL TUNNEL RELEASE    . CHOLECYSTECTOMY  1975  . CHOLECYSTECTOMY    . COLONOSCOPY    . COLONOSCOPY WITH PROPOFOL  12/29/2011   Procedure: COLONOSCOPY WITH PROPOFOL;  Surgeon: Lear Ng, MD;  Location: [facility];  Service: Endoscopy;  Laterality: N/A;  . COLONOSCOPY WITH PROPOFOL N/A 05/24/2014   Procedure: COLONOSCOPY WITH PROPOFOL;  Surgeon: Wilford Corner, MD;  [facility];  Service: Endoscopy;  Laterality: N/A;  . ESOPHAGEAL MANOMETRY N/A 02/22/2017   Procedure: ESOPHAGEAL MANOMETRY (EM);  Surgeon: Wilford Corner, MD;  [facility];  Service: Endoscopy;  Laterality: N/A;  . ESOPHAGOGASTRODUODENOSCOPY    . FOOT SURGERY     left, right  . FRACTURE SURGERY     left leg-knee  . HEEL SPUR EXCISION Bilateral   . HOT HEMOSTASIS  12/29/2011   Procedure: HOT HEMOSTASIS (ARGON PLASMA COAGULATION/BICAP);  Surgeon: Lear Ng, MD;  Location: [facility];  Service: Endoscopy;  Laterality: N/A;  . HOT HEMOSTASIS N/A 05/24/2014   Procedure: HOT HEMOSTASIS (ARGON PLASMA COAGULATION/BICAP);  Surgeon: Wilford Corner, MD;  Location: [facility];  Service: Endoscopy;  Laterality: N/A;  . leg surgery d/t break Left   . LUMBAR LAMINECTOMY/DECOMPRESSION MICRODISCECTOMY Left 04/05/2013   Procedure: LUMBAR FOUR  TO FIVE LUMBAR LAMINECTOMY/DECOMPRESSION MICRODISCECTOMY 1 LEVEL;  Surgeon: Eustace Moore, MD;  Location: [place] [facility]URO [facility]S;  Service: Neurosurgery;  Laterality: Left;  . NISSEN FUNDOPLICATION    . [place] IMPEDANCE STUDY N/A 02/22/2017   Procedure: [place] IMPEDANCE STUDY;  Surgeon: Wilford Corner, MD;  [facility];  Service: Endoscopy;  Laterality: N/A;  . TEE WITHOUT CARDIOVERSION N/A 02/24/2016   Procedure: TRANSESOPHAGEAL ECHOCARDIOGRAM (TEE);  Surgeon: Fay Records, MD;  Location: [place];  Service: Cardiovascular;  Laterality: N/A;  . TOTAL KNEE ARTHROPLASTY Left 10/07/2016  . TOTAL KNEE ARTHROPLASTY Left  10/07/2016   Procedure: TOTAL KNEE ARTHROPLASTY;  Surgeon: [name], MD;  Location: [place];  Service: Orthopedics;  Laterality: Left;    Current Medications: Current Meds  Medication Sig  . albuterol (VENTOLIN HFA) 108 (90 Base) MCG/ACT inhaler Inhale 1-2 puffs into the lungs every 4 (four) hours as needed for wheezing or shortness of breath.  Marland Kitchen amiodarone (PACERONE) 200 MG tablet Take 0.5 tablets (100 mg total) by mouth daily.  Marland Kitchen amLODipine (NORVASC) 5 MG tablet Take 1 tablet (5 mg total) by mouth daily.  . Ascorbic Acid (VITAMIN C WITH ROSE HIPS) 1000 MG tablet Take 1,000 mg by mouth daily.  . benzonatate (TESSALON) 200 MG capsule Take 1 capsule by mouth three times daily as needed for cough  . buPROPion (WELLBUTRIN XL) 300 MG 24 hr tablet Take 1 tablet (300 mg total) by mouth daily.  Marland Kitchen CALCIUM PO Take 1 tablet by mouth every morning.  . chlorpheniramine (CHLOR-TRIMETON) 4 MG tablet Take 4 mg by mouth at bedtime.  . Cholecalciferol (VITAMIN D) 2000 UNITS tablet Take 4,000 Units by mouth daily.  . CVS MELATONIN PO Take 1 tablet by mouth at bedtime as needed.  Marland Kitchen ELIQUIS 5 MG TABS tablet TAKE 1 TABLET TWICE DAILY  . fexofenadine (ALLEGRA) 180 MG tablet Take 180 mg by mouth daily as needed for allergies or rhinitis.  . furosemide (LASIX) 20 MG tablet Take 1 tablet (20 mg total) by mouth daily.  . Glycopyrrolate-Formoterol (BEVESPI AEROSPHERE) 9-4.8 MCG/ACT AERO Inhale 2 puffs into the lungs 2 (two) times daily.  Marland Kitchen levothyroxine (SYNTHROID) 75 MCG tablet Take 1 tablet (75 mcg total) by mouth daily before breakfast.  . metoprolol succinate (TOPROL-XL) 25 MG 24 hr tablet TAKE 1/2 (ONE-HALF) TABLET BY MOUTH ONCE DAILY  . potassium chloride (MICRO-K) 10 MEQ CR capsule Take 2 capsules (20 mEq total) by mouth daily.  . pravastatin (PRAVACHOL) 40 MG tablet Take 40 mg by mouth at bedtime.  Marland Kitchen Spacer/Aero-Holding Chambers (AEROCHAMBER MV) inhaler Use as instructed  . [DISCONTINUED] amiodarone  (PACERONE) 200 MG tablet Take 1 tablet (200 mg total) by mouth daily.     Allergies:   Lexapro [escitalopram], Peroxide [hydrogen peroxide], Zoloft [sertraline hcl], and Neosporin [neomycin-bacitracin zn-polymyx]   Social History   Socioeconomic History  . Marital status: Married    Spouse name: Dominica Severin  . Number of children: 2  . Years of education: 106  . Highest education level: Some college, no degree  Occupational History  . Not on file  Tobacco Use  . Smoking status: Never Smoker  . Smokeless tobacco: Never Used  Vaping Use  . Vaping Use: Never used  Substance and Sexual Activity  . Alcohol use: No  . Drug use: No  . Sexual activity: Yes    Birth control/protection: Surgical  Other Topics Concern  . Not on file  Social History Narrative   ** Merged History Encounter **  Social Determinants of Health   Financial Resource Strain:   . Difficulty of Paying Living Expenses: Not on file  Food Insecurity:   . Worried About Charity fundraiser in the Last Year: Not on file  . Ran Out of Food in the Last Year: Not on file  Transportation Needs:   . Lack of Transportation (Medical): Not on file  . Lack of Transportation (Non-Medical): Not on file  Physical Activity:   . Days of Exercise per Week: Not on file  . Minutes of Exercise per Session: Not on file  Stress:   . Feeling of Stress : Not on file  Social Connections:   . Frequency of Communication with Friends and Family: Not on file  . Frequency of Social Gatherings with Friends and Family: Not on file  . Attends Religious Services: Not on file  . Active Member of Clubs or Organizations: Not on file  . Attends Archivist Meetings: Not on file  . Marital Status: Not on file     Family History: The patient's family history includes Colon cancer in her mother; Heart disease in her father. There is no history of Breast cancer or Thyroid disease.  ROS:   Please see the history of present illness.      All other systems reviewed and are negative.  EKGs/Labs/Other Studies Reviewed:    The following studies were reviewed today: Echo-stable mild to moderate mitral regurgitation.  EKG:  EKG is  ordered today.  The ekg ordered today demonstrates sinus bradycardia 53 QTC 461 ms.  Recent Labs: 09/25/2019: TSH 0.65  Recent Lipid Panel    Component Value Date/Time   CHOL 125 02/18/2016 0454   TRIG 90 02/18/2016 0454   HDL 41 02/18/2016 0454   CHOLHDL 3.0 02/18/2016 0454   VLDL 18 02/18/2016 0454   LDLCALC 66 02/18/2016 0454     Physical Exam:    VS:  BP (!) 160/70   Pulse (!) 53   Ht 5\' 7"  (1.702 m)   Wt 239 lb (108.4 kg)   SpO2 98%   BMI 37.43 kg/m     Wt Readings from Last 3 Encounters:  11/02/19 239 lb (108.4 kg)  07/18/19 242 lb 3.2 oz (109.9 kg)  01/16/19 250 lb 9.6 oz (113.7 kg)     GEN:  Well nourished, well developed in no acute distress HEENT: Normal NECK: No JVD; No carotid bruits LYMPHATICS: No lymphadenopathy CARDIAC: brady reg, no murmurs, rubs, gallops RESPIRATORY:  Clear to auscultation without rales, wheezing or rhonchi  ABDOMEN: Soft, non-tender, non-distended MUSCULOSKELETAL:  No edema; No deformity  SKIN: Warm and dry NEUROLOGIC:  Alert and oriented x 3 PSYCHIATRIC:  Normal affect   ASSESSMENT:    1. Persistent atrial fibrillation ([place])   2. Essential hypertension   3. Nonrheumatic mitral valve regurgitation   4. Dyspnea on exertion   5. Medication management    PLAN:    In order of problems listed above:  Paroxysmal atrial fibrillation -Continuing medical management with amiodarone, however we will decrease her dosage to 100 mg daily..  Monitor LFTs, thyroid, lung status.  Thankfully, pulmonary has been following as well.  Her last TSH was excellent.  We will check an ALT and a CBC today.  Prior creatinine 1.4. -Continuing to maintain sinus rhythm.  Chronic anticoagulation -Has been on Eliquis.  At prior visit, she had had a fall in the  [employer] parking lot.  No broken bones.  No evidence of brain injury.  Had right periorbital ecchymosis at the time. -Hemoglobin and creatinine have been monitored closely.  Checking hemoglobin again today.  Dyspnea on exertion -Longstanding, continue to encourage activity.  Longstanding mild cough.  Pulmonary medicine has been following.  Doing well with this.  Essential hypertension -Under reasonable control when checked at home.  Mitral regurgitation -Mild to Moderate by echocardiogram reviewed from 11/11/2018.  This has been stable.  No need to repeat echocardiogram at this time.  Hyperpigmentation of lower extremities -Could possibly be a side effect of the amiodarone.  She does not have any significant edema causing the hyperpigmentation.  Continue to promote use of sunscreen, pants.  Essential tremor -She is currently on low-dose metoprolol and amiodarone.  If we wanted to try propranolol, we would need to stop the metoprolol and at this time, we are going to continue the metoprolol given its success with her atrial fibrillation.    Medication Adjustments/Labs and Tests Ordered: Current medicines are reviewed at length with the patient today.  Concerns regarding medicines are outlined above.  Orders Placed This Encounter  Procedures  . ALT  . CBC  . EKG 12-Lead   Meds ordered this encounter  Medications  . amiodarone (PACERONE) 200 MG tablet    Sig: Take 0.5 tablets (100 mg total) by mouth daily.    Dispense:  45 tablet    Refill:  3    Patient Instructions  Medication Instructions:  Please decrease Amiodarone to 100 mg a day.   Continue all other mediations as listed.  *If you need a refill on your cardiac medications before your next appointment, please call your pharmacy*  Lab Work: Please have blood work today (CBC, ALT)  If you have labs (blood work) drawn today and your tests are completely normal, you will receive your results only by: Marland Kitchen MyChart Message (if  you have MyChart) OR . A paper copy in the mail If you have any lab test that is abnormal or we need to change your treatment, we will call you to review the results.  Follow-Up: At [facility], you and your health needs are our priority.  As part of our continuing mission to provide you with exceptional heart care, we have created designated Provider Care Teams.  These Care Teams include your primary Cardiologist (physician) and Advanced Practice Providers (APPs -  Physician Assistants and Nurse Practitioners) who all work together to provide you with the care you need, when you need it.  We recommend signing up for the patient portal called "MyChart".  Sign up information is provided on this After Visit Summary.  MyChart is used to connect with patients for Virtual Visits (Telemedicine).  Patients are able to view lab/test results, encounter notes, upcoming appointments, etc.  Non-urgent messages can be sent to your provider as well.   To learn more about what you can do with MyChart, go to [email or web address].    Your next appointment:   6 month(s)  The format for your next appointment:   In Person  Provider:   Candee Furbish, MD   Thank you for choosing [facility]!!         Signed, Candee Furbish, MD  11/02/2019 12:18 PM    [place]

## 2019-11-03 ENCOUNTER — Telehealth: Payer: Self-pay

## 2019-11-03 NOTE — Telephone Encounter (Signed)
Pt given lab results, per MD. No questions/concerns at this time.

## 2019-11-07 DIAGNOSIS — G4733 Obstructive sleep apnea (adult) (pediatric): Secondary | ICD-10-CM | POA: Diagnosis not present

## 2019-11-08 DIAGNOSIS — I129 Hypertensive chronic kidney disease with stage 1 through stage 4 chronic kidney disease, or unspecified chronic kidney disease: Secondary | ICD-10-CM | POA: Diagnosis not present

## 2019-11-08 DIAGNOSIS — N1832 Chronic kidney disease, stage 3b: Secondary | ICD-10-CM | POA: Diagnosis not present

## 2019-11-08 DIAGNOSIS — N2581 Secondary hyperparathyroidism of renal origin: Secondary | ICD-10-CM | POA: Diagnosis not present

## 2019-11-08 DIAGNOSIS — D631 Anemia in chronic kidney disease: Secondary | ICD-10-CM | POA: Diagnosis not present

## 2019-11-17 DIAGNOSIS — H524 Presbyopia: Secondary | ICD-10-CM | POA: Diagnosis not present

## 2019-11-17 DIAGNOSIS — H2513 Age-related nuclear cataract, bilateral: Secondary | ICD-10-CM | POA: Diagnosis not present

## 2019-11-17 DIAGNOSIS — H5213 Myopia, bilateral: Secondary | ICD-10-CM | POA: Diagnosis not present

## 2019-11-17 DIAGNOSIS — H52203 Unspecified astigmatism, bilateral: Secondary | ICD-10-CM | POA: Diagnosis not present

## 2019-11-17 DIAGNOSIS — H25013 Cortical age-related cataract, bilateral: Secondary | ICD-10-CM | POA: Diagnosis not present

## 2019-11-20 ENCOUNTER — Other Ambulatory Visit: Payer: Self-pay

## 2019-11-20 ENCOUNTER — Ambulatory Visit
Admission: RE | Admit: 2019-11-20 | Discharge: 2019-11-20 | Disposition: A | Discharge status: Home / Self Care | Payer: Medicare HMO | Source: Ambulatory Visit | Attending: Family Medicine | Admitting: Family Medicine

## 2019-11-20 DIAGNOSIS — Z1231 Encounter for screening mammogram for malignant neoplasm of breast: Secondary | ICD-10-CM

## 2019-12-12 NOTE — Progress Notes (Deleted)
NEUROLOGY CONSULTATION NOTE  [name] MRN: [id number] DOB: Apr 07, 1945  Referring provider: Harlan Stains, MD Primary care provider: Harlan Stains, MD  Reason for consult:  Frequent falls   Subjective:  [name] [name] is a 74 year old ***-handed female with atrial fibrillation on Eliquis, CKD stage 3, arthritis, HTN, HLD and prediabetes who presents for balance problems and frequent falls.  History supplemented by cardiology, ophthalmology, ENT and referring provider's notes.    ***.  She was evaluated by Dr. Wilburn Cornelia, ENT, in July 2021 for disequilibrium.  Otologic exam was unremarkable.  Audiometric testing showed only mild age-associated symmetric high-frequency sensorineural hearing loss.  She has history of arthritis in the lumbar spine with prior history of left L4-5 laminectomy.  ***.  Last MRI of lumbar spine from 01/17/2016 personally reviewed showed mild disc protursions with mild left subarticular and foraminal narrowing at L4-5,k on the right at L5-S1, mild right subarticular narrowing at L3-4 and far left lateral disc protrusion at L3-4 with potential contact of left L3 nerve root.  She has synovitis and tenosynovitis of both feet and ankles, for which she has received injections by podiatry.    09/25/2019 Labs:  Hgb A1c 6.1, TSH 0.65  PAST MEDICAL HISTORY: Past Medical History:  Diagnosis Date  . Anxiety   . Arthritis   . Cataracts, bilateral    immature  . CKD (chronic kidney disease) stage 3, GFR 30-59 ml/min (HCC)   . Depression   . GERD (gastroesophageal reflux disease)    was on Nexium   . GERD (gastroesophageal reflux disease)   . Hemorrhoid   . History of bronchitis    couple of yrs ago  . History of colon polyps   . History of peristent atrial fibrillation   . Hyperlipidemia    takes Pravastatin daily  . Hypertension    takes Amlodipine and Diovan daily  . Hypertension   . Pre-diabetes   . Sleep apnea    no cpap used  . Sleep apnea    study done  20+yrs ago and doesn't use cpap  . Urinary urgency   . Weakness    in left leg r/t back    PAST SURGICAL HISTORY: Past Surgical History:  Procedure Laterality Date  . ABDOMINAL HYSTERECTOMY  1980   partial  . ABDOMINAL HYSTERECTOMY     partial  . BACK SURGERY    . CARDIOVERSION N/A 02/24/2016   Procedure: CARDIOVERSION;  Surgeon: Fay Records, MD;  Location: [facility] ENDOSCOPY;  Service: Cardiovascular;  Laterality: N/A;  . CARDIOVERSION N/A 03/19/2016   Procedure: CARDIOVERSION;  Surgeon: Fay Records, MD;  Location: [place];  Service: Cardiovascular;  Laterality: N/A;  . CARPAL TUNNEL RELEASE    . CHOLECYSTECTOMY  1975  . CHOLECYSTECTOMY    . COLONOSCOPY    . COLONOSCOPY WITH PROPOFOL  12/29/2011   Procedure: COLONOSCOPY WITH PROPOFOL;  Surgeon: Lear Ng, MD;  [facility]: [facility];  Service: Endoscopy;  Laterality: N/A;  . COLONOSCOPY WITH PROPOFOL N/A 05/24/2014   Procedure: COLONOSCOPY WITH PROPOFOL;  Surgeon: Wilford Corner, MD;  [facility];  Service: Endoscopy;  Laterality: N/A;  . ESOPHAGEAL MANOMETRY N/A 02/22/2017   Procedure: ESOPHAGEAL MANOMETRY (EM);  Surgeon: Wilford Corner, MD;  [facility];  Service: Endoscopy;  Laterality: N/A;  . ESOPHAGOGASTRODUODENOSCOPY    . FOOT SURGERY     left, right  . FRACTURE SURGERY     left leg-knee  . HEEL SPUR EXCISION Bilateral   .  HOT HEMOSTASIS  12/29/2011   Procedure: HOT HEMOSTASIS (ARGON PLASMA COAGULATION/BICAP);  Surgeon: Lear Ng, MD;  Location: [facility];  Service: Endoscopy;  Laterality: N/A;  . HOT HEMOSTASIS N/A 05/24/2014   Procedure: HOT HEMOSTASIS (ARGON PLASMA COAGULATION/BICAP);  Surgeon: Wilford Corner, MD;  Location: [facility];  Service: Endoscopy;  Laterality: N/A;  . leg surgery d/t break Left   . LUMBAR LAMINECTOMY/DECOMPRESSION MICRODISCECTOMY Left 04/05/2013   Procedure: LUMBAR FOUR TO FIVE LUMBAR LAMINECTOMY/DECOMPRESSION MICRODISCECTOMY 1 LEVEL;   Surgeon: Eustace Moore, MD;  Location: [place] [facility];  Service: Neurosurgery;  Laterality: Left;  . NISSEN FUNDOPLICATION    . [place] IMPEDANCE STUDY N/A 02/22/2017   Procedure: [place] IMPEDANCE STUDY;  Surgeon: Wilford Corner, MD;  [facility];  Service: Endoscopy;  Laterality: N/A;  . TEE WITHOUT CARDIOVERSION N/A 02/24/2016   Procedure: TRANSESOPHAGEAL ECHOCARDIOGRAM (TEE);  Surgeon: Fay Records, MD;  Location: [place];  Service: Cardiovascular;  Laterality: N/A;  . TOTAL KNEE ARTHROPLASTY Left 10/07/2016  . TOTAL KNEE ARTHROPLASTY Left 10/07/2016   Procedure: TOTAL KNEE ARTHROPLASTY;  Surgeon: [name], MD;  Location: [place];  Service: Orthopedics;  Laterality: Left;    MEDICATIONS: Current Outpatient Medications on File Prior to Visit  Medication Sig Dispense Refill  . albuterol (VENTOLIN HFA) 108 (90 Base) MCG/ACT inhaler Inhale 1-2 puffs into the lungs every 4 (four) hours as needed for wheezing or shortness of breath. 1 g 1  . amiodarone (PACERONE) 200 MG tablet Take 0.5 tablets (100 mg total) by mouth daily. 45 tablet 3  . amLODipine (NORVASC) 5 MG tablet Take 1 tablet (5 mg total) by mouth daily. 90 tablet 3  . Ascorbic Acid (VITAMIN C WITH ROSE HIPS) 1000 MG tablet Take 1,000 mg by mouth daily.    . benzonatate (TESSALON) 200 MG capsule Take 1 capsule by mouth three times daily as needed for cough 60 capsule 0  . buPROPion (WELLBUTRIN XL) 300 MG 24 hr tablet Take 1 tablet (300 mg total) by mouth daily. 90 tablet 0  . CALCIUM PO Take 1 tablet by mouth every morning.    . chlorpheniramine (CHLOR-TRIMETON) 4 MG tablet Take 4 mg by mouth at bedtime.    . Cholecalciferol (VITAMIN D) 2000 UNITS tablet Take 4,000 Units by mouth daily.    . CVS MELATONIN PO Take 1 tablet by mouth at bedtime as needed.    Marland Kitchen ELIQUIS 5 MG TABS tablet TAKE 1 TABLET TWICE DAILY 180 tablet 1  . fexofenadine (ALLEGRA) 180 MG tablet Take 180 mg by mouth daily as needed for allergies or rhinitis.     . furosemide (LASIX) 20 MG tablet Take 1 tablet (20 mg total) by mouth daily. 90 tablet 0  . Glycopyrrolate-Formoterol (BEVESPI AEROSPHERE) 9-4.8 MCG/ACT AERO Inhale 2 puffs into the lungs 2 (two) times daily. 5.9 g 0  . levothyroxine (SYNTHROID) 75 MCG tablet Take 1 tablet (75 mcg total) by mouth daily before breakfast. 90 tablet 3  . metoprolol succinate (TOPROL-XL) 25 MG 24 hr tablet TAKE 1/2 (ONE-HALF) TABLET BY MOUTH ONCE DAILY 45 tablet 3  . potassium chloride (MICRO-K) 10 MEQ CR capsule Take 2 capsules (20 mEq total) by mouth daily. 60 capsule 11  . pravastatin (PRAVACHOL) 40 MG tablet Take 40 mg by mouth at bedtime.    Marland Kitchen Spacer/Aero-Holding Chambers (AEROCHAMBER MV) inhaler Use as instructed 1 each 0   No current facility-administered medications on file prior to visit.    ALLERGIES: Allergies  Allergen Reactions  .  Lexapro [Escitalopram]     Makes her feel funny  . Peroxide [Hydrogen Peroxide] Other (See Comments)    Redness.   [place] [Sertraline Hcl]     Makes her feel funny  . Neosporin [Neomycin-Bacitracin Zn-Polymyx] Rash    Blisters, itching.     FAMILY HISTORY: Family History  Problem Relation Age of Onset  . Colon cancer Mother   . Heart disease Father   . Breast cancer Neg Hx   . Thyroid disease Neg Hx    ***.  SOCIAL HISTORY: Social History   Socioeconomic History  . Marital status: Married    Spouse name: Dominica Severin  . Number of children: 2  . Years of education: 56  . Highest education level: Some college, no degree  Occupational History  . Not on file  Tobacco Use  . Smoking status: Never Smoker  . Smokeless tobacco: Never Used  Vaping Use  . Vaping Use: Never used  Substance and Sexual Activity  . Alcohol use: No  . Drug use: No  . Sexual activity: Yes    Birth control/protection: Surgical  Other Topics Concern  . Not on file  Social History Narrative   ** Merged History Encounter **       Social Determinants of Health   Financial  Resource Strain:   . Difficulty of Paying Living Expenses: Not on file  Food Insecurity:   . Worried About Charity fundraiser in the Last Year: Not on file  . Ran Out of Food in the Last Year: Not on file  Transportation Needs:   . Lack of Transportation (Medical): Not on file  . Lack of Transportation (Non-Medical): Not on file  Physical Activity:   . Days of Exercise per Week: Not on file  . Minutes of Exercise per Session: Not on file  Stress:   . Feeling of Stress : Not on file  Social Connections:   . Frequency of Communication with Friends and Family: Not on file  . Frequency of Social Gatherings with Friends and Family: Not on file  . Attends Religious Services: Not on file  . Active Member of Clubs or Organizations: Not on file  . Attends Archivist Meetings: Not on file  . Marital Status: Not on file  Intimate Partner Violence:   . Fear of Current or Ex-Partner: Not on file  . Emotionally Abused: Not on file  . Physically Abused: Not on file  . Sexually Abused: Not on file    Objective:  *** General: No acute distress.  Patient appears well-groomed.   Head:  Normocephalic/atraumatic Eyes:  fundi examined but not visualized Neck: supple, no paraspinal tenderness, full range of motion Back: No paraspinal tenderness Heart: regular rate and rhythm Lungs: Clear to auscultation bilaterally. Vascular: No carotid bruits. Neurological Exam: Mental status: alert and oriented to person, place, and time, recent and remote memory intact, fund of knowledge intact, attention and concentration intact, speech fluent and not dysarthric, language intact. Cranial nerves: CN I: not tested CN II: pupils equal, round and reactive to light, visual fields intact CN III, IV, VI:  full range of motion, no nystagmus, no ptosis CN V: facial sensation intact. CN VII: upper and lower face symmetric CN VIII: hearing intact CN IX, X: gag intact, uvula midline CN XI:  sternocleidomastoid and trapezius muscles intact CN XII: tongue midline Bulk & Tone: normal, no fasciculations. Motor:  muscle strength 5/5 throughout Sensation:  Pinprick, temperature and vibratory sensation intact. Deep Tendon  Reflexes:  2+ throughout,  toes downgoing.   Finger to nose testing:  Without dysmetria.   Heel to shin:  Without dysmetria.   Gait:  Normal station and stride.  Romberg negative.  Assessment/Plan:   ***    Thank you for allowing me to take part in the care of this patient.  Metta Clines, DO  CC: ***

## 2019-12-14 ENCOUNTER — Ambulatory Visit: Payer: Medicare HMO | Admitting: Neurology

## 2019-12-19 DIAGNOSIS — M1712 Unilateral primary osteoarthritis, left knee: Secondary | ICD-10-CM | POA: Diagnosis not present

## 2019-12-19 DIAGNOSIS — M19012 Primary osteoarthritis, left shoulder: Secondary | ICD-10-CM | POA: Diagnosis not present

## 2020-01-07 ENCOUNTER — Other Ambulatory Visit: Payer: Self-pay | Admitting: Cardiology

## 2020-02-02 ENCOUNTER — Other Ambulatory Visit: Payer: Self-pay | Admitting: Cardiology

## 2020-02-06 ENCOUNTER — Other Ambulatory Visit: Payer: Self-pay | Admitting: Endocrinology

## 2020-02-06 ENCOUNTER — Other Ambulatory Visit: Payer: Self-pay | Admitting: Cardiology

## 2020-02-06 DIAGNOSIS — E032 Hypothyroidism due to medicaments and other exogenous substances: Secondary | ICD-10-CM

## 2020-02-19 NOTE — Progress Notes (Addendum)
NEUROLOGY CONSULTATION NOTE  [name] [name] MRN: [id number] DOB: 1945/05/01  Referring provider: Harlan Stains, MD Primary care provider: Harlan Stains, MD  Reason for consult:  Balance disorder/falls   Subjective:  [name] [name] is a 75 year old right-handed female with hypertensive CKD, paroxysmal atrial fibrillation, pre-diabetes, hypothyroidism, depression, and bilateral cataract who presents for balance disorder with falls.  History supplemented by referring provider's note.  Since at least 2019, she has had difficulty with balance.  When walking, she will sometimes bump into walls.  She has had several falls.  A couple of times, they were due to tripping.  However, she may just be standing and then take a step back and fall backwards.  She says that her hands sometime shake, usually while holding something but also at rest.  When she sits, her legs may shake as well.  When she leans forward while sitting down into a chair, she may lose her balance.  She denies dizziness, double vision, low back pain, weakness in legs or numbness in feet.  She denies neck pain but feels grinding in her neck when she turns her head.  Her orthopedist told her that she has arthritis in her cervical spine.  She has osteoarthritis of her knees and has previously undergone left knee replacement.  She has prior history of left ankle surgery.  She has bone spurs in her feet.  She has prior history of lumbar spine surgery.  Last MRI of lumbar spine from 01/17/2016 showed left laminectomy at L4-5 with recurrent left paramedian disc protrusion as well as left lateral disc protrusion at L3-4.  Labs from September 2021 include Hgb A1c 6.1 and TSH 0.65.     PAST MEDICAL HISTORY: Past Medical History:  Diagnosis Date  . Anxiety   . Arthritis   . Cataracts, bilateral    immature  . CKD (chronic kidney disease) stage 3, GFR 30-59 ml/min (HCC)   . Depression   . GERD (gastroesophageal reflux disease)    was on Nexium    . GERD (gastroesophageal reflux disease)   . Hemorrhoid   . History of bronchitis    couple of yrs ago  . History of colon polyps   . History of peristent atrial fibrillation   . Hyperlipidemia    takes Pravastatin daily  . Hypertension    takes Amlodipine and Diovan daily  . Hypertension   . Pre-diabetes   . Sleep apnea    no cpap used  . Sleep apnea    study done 20+yrs ago and doesn't use cpap  . Urinary urgency   . Weakness    in left leg r/t back    PAST SURGICAL HISTORY: Past Surgical History:  Procedure Laterality Date  . ABDOMINAL HYSTERECTOMY  1980   partial  . ABDOMINAL HYSTERECTOMY     partial  . BACK SURGERY    . CARDIOVERSION N/A 02/24/2016   Procedure: CARDIOVERSION;  Surgeon: Fay Records, MD;  Location[facility];  Service: Cardiovascular;  Laterality: N/A;  . CARDIOVERSION N/A 03/19/2016   Procedure: CARDIOVERSION;  Surgeon: Fay Records, MD;  Location: [place];  Service: Cardiovascular;  Laterality: N/A;  . CARPAL TUNNEL RELEASE    . CHOLECYSTECTOMY  1975  . CHOLECYSTECTOMY    . COLONOSCOPY    . COLONOSCOPY WITH PROPOFOL  12/29/2011   Procedure: COLONOSCOPY WITH PROPOFOL;  Surgeon: Lear Ng, MD;  Location: [facility] ENDOSCOPY;  Service: Endoscopy;  Laterality: N/A;  . COLONOSCOPY WITH PROPOFOL  N/A 05/24/2014   Procedure: COLONOSCOPY WITH PROPOFOL;  Surgeon: Wilford Corner, MD;  Location: [facility] ENDOSCOPY;  Service: Endoscopy;  Laterality: N/A;  . ESOPHAGEAL MANOMETRY N/A 02/22/2017   Procedure: ESOPHAGEAL MANOMETRY (EM);  Surgeon: Wilford Corner, MD;  Location: [facility] ENDOSCOPY;  Service: Endoscopy;  Laterality: N/A;  . ESOPHAGOGASTRODUODENOSCOPY    . FOOT SURGERY     left, right  . FRACTURE SURGERY     left leg-knee  . HEEL SPUR EXCISION Bilateral   . HOT HEMOSTASIS  12/29/2011   Procedure: HOT HEMOSTASIS (ARGON PLASMA COAGULATION/BICAP);  Surgeon: Lear Ng, MD;  Location: [facility];  Service: Endoscopy;  Laterality: N/A;  .  HOT HEMOSTASIS N/A 05/24/2014   Procedure: HOT HEMOSTASIS (ARGON PLASMA COAGULATION/BICAP);  Surgeon: Wilford Corner, MD;  Location: [facility];  Service: Endoscopy;  Laterality: N/A;  . leg surgery d/t break Left   . LUMBAR LAMINECTOMY/DECOMPRESSION MICRODISCECTOMY Left 04/05/2013   Procedure: LUMBAR FOUR TO FIVE LUMBAR LAMINECTOMY/DECOMPRESSION MICRODISCECTOMY 1 LEVEL;  Surgeon: Eustace Moore, MD;  Location: [place] [facility];  Service: Neurosurgery;  Laterality: Left;  . NISSEN FUNDOPLICATION    . [place] IMPEDANCE STUDY N/A 02/22/2017   Procedure: [place] IMPEDANCE STUDY;  Surgeon: Wilford Corner, MD;  Location: [facility] ENDOSCOPY;  Service: Endoscopy;  Laterality: N/A;  . TEE WITHOUT CARDIOVERSION N/A 02/24/2016   Procedure: TRANSESOPHAGEAL ECHOCARDIOGRAM (TEE);  Surgeon: Fay Records, MD;  Location: [place];  Service: Cardiovascular;  Laterality: N/A;  . TOTAL KNEE ARTHROPLASTY Left 10/07/2016  . TOTAL KNEE ARTHROPLASTY Left 10/07/2016   Procedure: TOTAL KNEE ARTHROPLASTY;  Surgeon: [name], MD;  Location: [place];  Service: Orthopedics;  Laterality: Left;    MEDICATIONS: Current Outpatient Medications on File Prior to Visit  Medication Sig Dispense Refill  . albuterol (VENTOLIN HFA) 108 (90 Base) MCG/ACT inhaler Inhale 1-2 puffs into the lungs every 4 (four) hours as needed for wheezing or shortness of breath. 1 g 1  . amiodarone (PACERONE) 200 MG tablet Take 0.5 tablets (100 mg total) by mouth daily. 45 tablet 2  . amLODipine (NORVASC) 5 MG tablet Take 1 tablet (5 mg total) by mouth daily. 90 tablet 3  . Ascorbic Acid (VITAMIN C WITH ROSE HIPS) 1000 MG tablet Take 1,000 mg by mouth daily.    . benzonatate (TESSALON) 200 MG capsule Take 1 capsule by mouth three times daily as needed for cough 60 capsule 0  . buPROPion (WELLBUTRIN XL) 300 MG 24 hr tablet Take 1 tablet (300 mg total) by mouth daily. 90 tablet 0  . CALCIUM PO Take 1 tablet by mouth every morning.    . chlorpheniramine  (CHLOR-TRIMETON) 4 MG tablet Take 4 mg by mouth at bedtime.    . Cholecalciferol (VITAMIN D) 2000 UNITS tablet Take 4,000 Units by mouth daily.    . [employer] MELATONIN PO Take 1 tablet by mouth at bedtime as needed.    Marland Kitchen ELIQUIS 5 MG TABS tablet TAKE 1 TABLET TWICE DAILY 180 tablet 1  . fexofenadine (ALLEGRA) 180 MG tablet Take 180 mg by mouth daily as needed for allergies or rhinitis.    . furosemide (LASIX) 20 MG tablet TAKE 1 TABLET EVERY DAY 90 tablet 3  . Glycopyrrolate-Formoterol (BEVESPI AEROSPHERE) 9-4.8 MCG/ACT AERO Inhale 2 puffs into the lungs 2 (two) times daily. 5.9 g 0  . levothyroxine (SYNTHROID) 75 MCG tablet TAKE 1 TABLET (75 MCG TOTAL) BY MOUTH DAILY BEFORE BREAKFAST. 90 tablet 0  . metoprolol succinate (TOPROL-XL) 25 MG 24 hr tablet  TAKE 1/2 TABLET ONE TIME DAILY 45 tablet 3  . potassium chloride SA (KLOR-CON) 20 MEQ tablet Take 1 tablet (20 mEq total) by mouth daily. 90 tablet 2  . pravastatin (PRAVACHOL) 40 MG tablet Take 40 mg by mouth at bedtime.    Marland Kitchen Spacer/Aero-Holding Chambers (AEROCHAMBER MV) inhaler Use as instructed 1 each 0   No current facility-administered medications on file prior to visit.    ALLERGIES: Allergies  Allergen Reactions  . Lexapro [Escitalopram]     Makes her feel funny  . Peroxide [Hydrogen Peroxide] Other (See Comments)    Redness.   [place] [Sertraline Hcl]     Makes her feel funny  . Neosporin [Neomycin-Bacitracin Zn-Polymyx] Rash    Blisters, itching.     FAMILY HISTORY: Family History  Problem Relation Age of Onset  . Colon cancer Mother   . Heart disease Father   . Breast cancer Neg Hx   . Thyroid disease Neg Hx      SOCIAL HISTORY: Social History   Socioeconomic History  . Marital status: Married    Spouse name: Dominica Severin  . Number of children: 2  . Years of education: 29  . Highest education level: Some college, no degree  Occupational History  . Not on file  Tobacco Use  . Smoking status: Never Smoker  . Smokeless  tobacco: Never Used  Vaping Use  . Vaping Use: Never used  Substance and Sexual Activity  . Alcohol use: No  . Drug use: No  . Sexual activity: Yes    Birth control/protection: Surgical  Other Topics Concern  . Not on file  Social History Narrative   ** Merged History Encounter **       Social Determinants of Health   Financial Resource Strain: Not on file  Food Insecurity: Not on file  Transportation Needs: Not on file  Physical Activity: Not on file  Stress: Not on file  Social Connections: Not on file  Intimate Partner Violence: Not on file    Objective:  Blood pressure (!) 150/81, pulse 83, resp. rate 18, height 5\' 7"  (1.702 m), weight 244 lb (110.7 kg), SpO2 96 %. General: No acute distress.  Patient appears well-groomed.   Head:  Normocephalic/atraumatic Eyes:  fundi examined but not visualized Neck: supple, no paraspinal tenderness, full range of motion Back: No paraspinal tenderness Heart: regular rate and rhythm Lungs: Clear to auscultation bilaterally. Vascular: No carotid bruits. Neurological Exam: Mental status: alert and oriented to person, place, and time, recent and remote memory intact, fund of knowledge intact, attention and concentration intact, speech fluent and not dysarthric, language intact. Cranial nerves: CN I: not tested CN II: pupils equal, round and reactive to light, visual fields intact CN III, IV, VI:  full range of motion, no nystagmus, no ptosis CN V: facial sensation intact. CN VII: upper and lower face symmetric CN VIII: hearing intact CN IX, X: gag intact, uvula midline CN XI: sternocleidomastoid and trapezius muscles intact CN XII: tongue midline Bulk & Tone: normal, no fasciculations. Motor:  muscle strength 5/5 throughout.  Very mild/fine tremor in hands when outstretched.  No resting tremor noted.  No bradykinesia Sensation:  Slight reduced pinprick sensation in first toe of left foot. Slightly reduced vibratory sensation in  toes.   Deep Tendon Reflexes:  3+ right patella, otherwise 2+ throughout, absent Hoffman's sign,  toes downgoing.   Finger to nose testing:  Without dysmetria.   Heel to shin:  Without dysmetria.   Gait:  Upright posture, normal arm swing, slightly wide-based but steady gait.  Able to turn.  Difficulty tandem walk.  Romberg with slight sway.  Assessment/Plan:   Unsteady gait/balance disorder - she has a very slight postural tremor in hands but no signs on exam that suggest Parkinson's or Parkinson-plus syndrome.  Signs of mild neuropathy in the feet but nothing significant.  No back pain or claudication to correlate with lumbar spinal stenosis.  She does demonstrate hyperreflexia in her right patella but otherwise no pathologic reflexes.    At this time, I would check MRI of cervical spine to evaluate for cervical myelopathy.  If unremarkable would pursue MRI of brain without contrast.  From there, other testing to consider would be MRI thoracic spine and NCV-EMG.  Follow up after testing.    Thank you for allowing me to take part in the care of this patient.  Metta Clines, DO  CC: Harlan Stains, MD

## 2020-02-20 ENCOUNTER — Encounter: Payer: Self-pay | Admitting: Neurology

## 2020-02-20 ENCOUNTER — Ambulatory Visit: Payer: Medicare HMO | Admitting: Neurology

## 2020-02-20 ENCOUNTER — Other Ambulatory Visit: Payer: Self-pay

## 2020-02-20 VITALS — BP 150/81 | HR 83 | Resp 18 | Ht 67.0 in | Wt 244.0 lb

## 2020-02-20 DIAGNOSIS — R292 Abnormal reflex: Secondary | ICD-10-CM | POA: Diagnosis not present

## 2020-02-20 DIAGNOSIS — R2689 Other abnormalities of gait and mobility: Secondary | ICD-10-CM | POA: Diagnosis not present

## 2020-02-20 DIAGNOSIS — R2681 Unsteadiness on feet: Secondary | ICD-10-CM

## 2020-02-20 NOTE — Patient Instructions (Addendum)
We will first check MRI of cervical spine.  Further recommendations pending results Follow up after testing     We have sent a referral to [facility] for your MRI and they will call you directly to schedule your appointment. They are located at [place]. If you need to contact them directly please call [phone].

## 2020-03-12 ENCOUNTER — Other Ambulatory Visit: Payer: Self-pay

## 2020-03-12 ENCOUNTER — Telehealth: Payer: Self-pay | Admitting: Neurology

## 2020-03-12 ENCOUNTER — Ambulatory Visit: Admission: RE | Admit: 2020-03-12 | Payer: Medicare HMO | Source: Ambulatory Visit

## 2020-03-12 DIAGNOSIS — R2689 Other abnormalities of gait and mobility: Secondary | ICD-10-CM

## 2020-03-12 DIAGNOSIS — R292 Abnormal reflex: Secondary | ICD-10-CM

## 2020-03-12 DIAGNOSIS — R2681 Unsteadiness on feet: Secondary | ICD-10-CM

## 2020-03-12 NOTE — Telephone Encounter (Signed)
Patient called in stating she was supposed to have an MRI today, but had an anxiety attack and had to stop it. She is not sure what she should do from here?

## 2020-03-13 ENCOUNTER — Other Ambulatory Visit: Payer: Self-pay | Admitting: Neurology

## 2020-03-13 MED ORDER — DIAZEPAM 5 MG PO TABS: ORAL_TABLET | ORAL | 0 refills | Status: DC

## 2020-03-13 NOTE — Telephone Encounter (Signed)
Pt advised of script and to take it 30-60 Minutes before her MRI.

## 2020-03-13 NOTE — Telephone Encounter (Signed)
Telephone call to pt, pt wanted to know if she could get something to calm her.   New MRI order added for pt.   Please send script to pharmacy on file

## 2020-03-13 NOTE — Telephone Encounter (Signed)
Diazepam sent.  Needs driver to and from the MRI facility

## 2020-03-19 DIAGNOSIS — M89371 Hypertrophy of bone, right ankle and foot: Secondary | ICD-10-CM | POA: Diagnosis not present

## 2020-03-19 DIAGNOSIS — M89372 Hypertrophy of bone, left ankle and foot: Secondary | ICD-10-CM | POA: Diagnosis not present

## 2020-03-19 DIAGNOSIS — M79671 Pain in right foot: Secondary | ICD-10-CM | POA: Diagnosis not present

## 2020-03-19 DIAGNOSIS — M79672 Pain in left foot: Secondary | ICD-10-CM | POA: Diagnosis not present

## 2020-03-25 DIAGNOSIS — D6869 Other thrombophilia: Secondary | ICD-10-CM | POA: Diagnosis not present

## 2020-03-25 DIAGNOSIS — I480 Paroxysmal atrial fibrillation: Secondary | ICD-10-CM | POA: Diagnosis not present

## 2020-03-25 DIAGNOSIS — E785 Hyperlipidemia, unspecified: Secondary | ICD-10-CM | POA: Diagnosis not present

## 2020-03-25 DIAGNOSIS — Z0000 Encounter for general adult medical examination without abnormal findings: Secondary | ICD-10-CM | POA: Diagnosis not present

## 2020-03-25 DIAGNOSIS — F331 Major depressive disorder, recurrent, moderate: Secondary | ICD-10-CM | POA: Diagnosis not present

## 2020-03-25 DIAGNOSIS — R7303 Prediabetes: Secondary | ICD-10-CM | POA: Diagnosis not present

## 2020-03-25 DIAGNOSIS — E032 Hypothyroidism due to medicaments and other exogenous substances: Secondary | ICD-10-CM | POA: Diagnosis not present

## 2020-03-25 DIAGNOSIS — Z1159 Encounter for screening for other viral diseases: Secondary | ICD-10-CM | POA: Diagnosis not present

## 2020-03-25 DIAGNOSIS — K219 Gastro-esophageal reflux disease without esophagitis: Secondary | ICD-10-CM | POA: Diagnosis not present

## 2020-03-25 DIAGNOSIS — N183 Chronic kidney disease, stage 3 unspecified: Secondary | ICD-10-CM | POA: Diagnosis not present

## 2020-03-25 DIAGNOSIS — I129 Hypertensive chronic kidney disease with stage 1 through stage 4 chronic kidney disease, or unspecified chronic kidney disease: Secondary | ICD-10-CM | POA: Diagnosis not present

## 2020-04-06 ENCOUNTER — Ambulatory Visit
Admission: RE | Admit: 2020-04-06 | Discharge: 2020-04-06 | Disposition: A | Discharge status: Home / Self Care | Payer: Medicare HMO | Source: Ambulatory Visit | Attending: Neurology | Admitting: Neurology

## 2020-04-06 ENCOUNTER — Other Ambulatory Visit: Payer: Self-pay

## 2020-04-06 DIAGNOSIS — R2681 Unsteadiness on feet: Secondary | ICD-10-CM

## 2020-04-06 DIAGNOSIS — M4802 Spinal stenosis, cervical region: Secondary | ICD-10-CM | POA: Diagnosis not present

## 2020-04-06 DIAGNOSIS — R292 Abnormal reflex: Secondary | ICD-10-CM

## 2020-04-06 DIAGNOSIS — R2689 Other abnormalities of gait and mobility: Secondary | ICD-10-CM

## 2020-04-08 ENCOUNTER — Other Ambulatory Visit: Payer: Self-pay | Admitting: Cardiology

## 2020-04-08 ENCOUNTER — Telehealth: Payer: Self-pay | Admitting: Neurology

## 2020-04-08 ENCOUNTER — Telehealth: Payer: Self-pay

## 2020-04-08 DIAGNOSIS — R2689 Other abnormalities of gait and mobility: Secondary | ICD-10-CM

## 2020-04-08 DIAGNOSIS — R2681 Unsteadiness on feet: Secondary | ICD-10-CM

## 2020-04-08 MED ORDER — DIAZEPAM 5 MG PO TABS: ORAL_TABLET | ORAL | 0 refills | Status: DC

## 2020-04-08 NOTE — Telephone Encounter (Signed)
-----   Message from Pieter Partridge, DO sent at 04/07/2020 11:21 AM EDT ----- MRI of cervical spine shows degenerative/arthritic changes but no significant pressing on the spinal cord that would cause balance problems.  Would proceed with MRI of brain without contrast for unsteady gait and balance disorder.

## 2020-04-08 NOTE — Telephone Encounter (Signed)
Pt last saw Dr Marlou Porch 11/02/19, last labs 03/25/20 Creat 1.19 at [facility] per [facility].  Age 75, weight 110.7kg, based on specified criteria pt is on appropriate dosage of Eliquis 5mg  BID.  Will refill rx.

## 2020-04-08 NOTE — Telephone Encounter (Signed)
Rx sent 

## 2020-04-08 NOTE — Telephone Encounter (Signed)
-----   Message from Wilder Glade, LPN sent at 9/39/6886 10:43 AM EDT ----- Pt needs something for anxiety called in for MRI Brain

## 2020-04-08 NOTE — Telephone Encounter (Signed)
Pt called and informed that MRI of cervical spine shows degenerative/arthritic changes but no significant pressing on the spinal cord that would cause balance problems. Would proceed with MRI of brain without contrast for unsteady gait and balance disorder pt stated that she would need something for anxiety

## 2020-04-27 ENCOUNTER — Other Ambulatory Visit: Payer: Self-pay

## 2020-04-27 ENCOUNTER — Ambulatory Visit
Admission: RE | Admit: 2020-04-27 | Discharge: 2020-04-27 | Disposition: A | Discharge status: Home / Self Care | Payer: Medicare HMO | Source: Ambulatory Visit | Attending: Neurology | Admitting: Neurology

## 2020-04-27 DIAGNOSIS — R2689 Other abnormalities of gait and mobility: Secondary | ICD-10-CM | POA: Diagnosis not present

## 2020-04-27 DIAGNOSIS — R2681 Unsteadiness on feet: Secondary | ICD-10-CM

## 2020-04-27 DIAGNOSIS — G9389 Other specified disorders of brain: Secondary | ICD-10-CM | POA: Diagnosis not present

## 2020-04-29 ENCOUNTER — Other Ambulatory Visit: Payer: Self-pay

## 2020-04-29 ENCOUNTER — Other Ambulatory Visit: Payer: Self-pay | Admitting: Neurology

## 2020-04-29 DIAGNOSIS — R292 Abnormal reflex: Secondary | ICD-10-CM

## 2020-04-29 DIAGNOSIS — R2681 Unsteadiness on feet: Secondary | ICD-10-CM

## 2020-04-29 MED ORDER — DIAZEPAM 5 MG PO TABS: ORAL_TABLET | ORAL | 0 refills | Status: DC

## 2020-04-29 NOTE — Progress Notes (Signed)
Valium sent to pharmacy 

## 2020-04-30 ENCOUNTER — Other Ambulatory Visit: Payer: Self-pay | Admitting: Neurology

## 2020-04-30 DIAGNOSIS — R2681 Unsteadiness on feet: Secondary | ICD-10-CM

## 2020-04-30 DIAGNOSIS — R292 Abnormal reflex: Secondary | ICD-10-CM

## 2020-05-03 ENCOUNTER — Other Ambulatory Visit: Payer: Medicare HMO

## 2020-05-03 ENCOUNTER — Ambulatory Visit
Admission: RE | Admit: 2020-05-03 | Discharge: 2020-05-03 | Disposition: A | Discharge status: Home / Self Care | Payer: Medicare HMO | Source: Ambulatory Visit | Attending: Neurology | Admitting: Neurology

## 2020-05-03 ENCOUNTER — Other Ambulatory Visit: Payer: Self-pay

## 2020-05-03 ENCOUNTER — Other Ambulatory Visit: Payer: Self-pay | Admitting: Neurology

## 2020-05-03 ENCOUNTER — Telehealth: Payer: Self-pay

## 2020-05-03 DIAGNOSIS — R292 Abnormal reflex: Secondary | ICD-10-CM

## 2020-05-03 DIAGNOSIS — M48061 Spinal stenosis, lumbar region without neurogenic claudication: Secondary | ICD-10-CM | POA: Diagnosis not present

## 2020-05-03 DIAGNOSIS — R2681 Unsteadiness on feet: Secondary | ICD-10-CM

## 2020-05-03 DIAGNOSIS — R2689 Other abnormalities of gait and mobility: Secondary | ICD-10-CM

## 2020-05-03 MED ORDER — DIAZEPAM 5 MG PO TABS: ORAL_TABLET | ORAL | 0 refills | Status: DC

## 2020-05-03 NOTE — Telephone Encounter (Signed)
-----   Message from Pieter Partridge, DO sent at 05/03/2020 11:57 AM EDT ----- MRI of lumbar spine shows no cause for balance problems.  Would check MRI of thoracic spine without contrast

## 2020-05-03 NOTE — Telephone Encounter (Signed)
Patient agrees to the Mri of thoracic spine W/O Contrast order placed.  Pt state she will need a Valium to get through the imaging please.

## 2020-05-03 NOTE — Telephone Encounter (Signed)
Script for Valium sent

## 2020-05-18 ENCOUNTER — Other Ambulatory Visit: Payer: Self-pay

## 2020-05-18 ENCOUNTER — Ambulatory Visit
Admission: RE | Admit: 2020-05-18 | Discharge: 2020-05-18 | Disposition: A | Discharge status: Home / Self Care | Payer: Medicare HMO | Source: Ambulatory Visit | Attending: Neurology | Admitting: Neurology

## 2020-05-18 DIAGNOSIS — R292 Abnormal reflex: Secondary | ICD-10-CM

## 2020-05-18 DIAGNOSIS — R296 Repeated falls: Secondary | ICD-10-CM | POA: Diagnosis not present

## 2020-05-18 DIAGNOSIS — R2681 Unsteadiness on feet: Secondary | ICD-10-CM

## 2020-05-18 DIAGNOSIS — R42 Dizziness and giddiness: Secondary | ICD-10-CM | POA: Diagnosis not present

## 2020-05-20 ENCOUNTER — Telehealth: Payer: Self-pay | Admitting: Neurology

## 2020-05-20 ENCOUNTER — Telehealth: Payer: Self-pay | Admitting: Cardiology

## 2020-05-20 ENCOUNTER — Other Ambulatory Visit: Payer: Self-pay | Admitting: *Deleted

## 2020-05-20 DIAGNOSIS — R292 Abnormal reflex: Secondary | ICD-10-CM

## 2020-05-20 DIAGNOSIS — R202 Paresthesia of skin: Secondary | ICD-10-CM

## 2020-05-20 DIAGNOSIS — R2681 Unsteadiness on feet: Secondary | ICD-10-CM

## 2020-05-20 NOTE — Telephone Encounter (Signed)
Pt is calling in regards to an appointment that she was supposed to have on 06/05/20 with Dr. Marlou Porch, I do not see an appt for this pt on this day. Pt only wants to see Dr. Gillian Shields NO PA's, I offered her my first available and she was not happy with it. Pt would like Dr. Marlou Porch nurse return her call with an appointment.

## 2020-05-20 NOTE — Telephone Encounter (Signed)
Left patient a message to schedule: EMG/ BLE with Dr. Posey Pronto.

## 2020-05-28 ENCOUNTER — Ambulatory Visit: Payer: Medicare HMO | Admitting: Neurology

## 2020-05-28 ENCOUNTER — Other Ambulatory Visit: Payer: Self-pay

## 2020-05-28 DIAGNOSIS — R2681 Unsteadiness on feet: Secondary | ICD-10-CM

## 2020-05-28 DIAGNOSIS — R292 Abnormal reflex: Secondary | ICD-10-CM

## 2020-05-28 DIAGNOSIS — R202 Paresthesia of skin: Secondary | ICD-10-CM

## 2020-05-28 NOTE — Procedures (Signed)
[facility]  [address]  [place], [place] [address] Tel: [phone] Fax:  [phone] Test Date:  05/28/2020  Patient: [name] [name] DOB: 01-Sep-1945 Physician: Narda Amber, DO  Sex: Female Height: 5\' 7"  Ref Phys: Metta Clines, D.O.  ID#: [id number]   Technician:    Patient Complaints: This is a 75 year old female referred for evaluation of gait unsteadiness.  NCV & EMG Findings: Electrodiagnostic testing of the right lower extremity and additional studies of the left shows: 1. Bilateral sural and superficial peroneal sensory responses are within normal limits. 2. Bilateral peroneal and tibial motor responses are within normal limits. 3. Bilateral tibial H reflex studies are within normal limits. 4. There is no evidence of active or chronic motor axonal changes affecting any of the tested muscles.  Motor unit configuration and recruitment pattern is within normal limits.  Proximal and deep muscles were not tested as the patient is on anticoagulation therapy.  Impression: This is a normal study of the lower extremities.  In particular, there is no evidence of a sensorimotor polyneuropathy or lumbosacral radiculopathy.   ___________________________ Narda Amber, DO    Nerve Conduction Studies Anti Sensory Summary Table   Stim Site NR Peak (ms) Norm Peak (ms) P-T Amp (V) Norm P-T Amp  Left Sup Peroneal Anti Sensory (Ant Lat Mall)  32C  12 cm    2.3 <4.6 8.3 >3  Right Sup Peroneal Anti Sensory (Ant Lat Mall)  32C  12 cm    2.4 <4.6 8.8 >3  Left Sural Anti Sensory (Lat Mall)  32C  Calf    2.2 <4.6 12.6 >3  Right Sural Anti Sensory (Lat Mall)  32C  Calf    3.0 <4.6 12.2 >3   Motor Summary Table   Stim Site NR Onset (ms) Norm Onset (ms) O-P Amp (mV) Norm O-P Amp Site1 Site2 Delta-0 (ms) Dist (cm) Vel (m/s) Norm Vel (m/s)  Left Peroneal Motor (Ext Dig Brev)  32C  Ankle    4.7 <6.0 2.7 >2.5 B Fib Ankle 5.8 33.0 57 >40  B Fib    10.5  2.6  Poplt B  Fib 1.5 8.0 53 >40  Poplt    12.0  2.6         Right Peroneal Motor (Ext Dig Brev)  32C  Ankle    3.7 <6.0 3.0 >2.5 B Fib Ankle 7.2 33.0 46 >40  B Fib    10.9  2.6  Poplt B Fib 1.4 8.0 57 >40  Poplt    12.3  2.5         Left Tibial Motor (Abd Hall Brev)  32C  Ankle    4.4 <6.0 7.7 >4 Knee Ankle 9.4 42.0 45 >40  Knee    13.8  4.3         Right Tibial Motor (Abd Hall Brev)  32C  Ankle    4.0 <6.0 9.2 >4 Knee Ankle 8.1 43.0 53 >40  Knee    12.1  6.6          H Reflex Studies   NR H-Lat (ms) Lat Norm (ms) L-R H-Lat (ms)  Left Tibial (Gastroc)  32C     32.93 <35 0.00  Right Tibial (Gastroc)  32C     32.93 <35 0.00   EMG   Side Muscle Ins Act Fibs Psw Fasc Number Recrt Dur Dur. Amp Amp. Poly Poly. Comment  Right AntTibialis Nml Nml Nml Nml Nml Nml Nml Nml Nml Nml Nml  Nml N/A  Right Gastroc Nml Nml Nml Nml Nml Nml Nml Nml Nml Nml Nml Nml N/A  Right RectFemoris Nml Nml Nml Nml Nml Nml Nml Nml Nml Nml Nml Nml N/A  Left AntTibialis Nml Nml Nml Nml Nml Nml Nml Nml Nml Nml Nml Nml N/A  Left Gastroc Nml Nml Nml Nml Nml Nml Nml Nml Nml Nml Nml Nml N/A  Left RectFemoris Nml Nml Nml Nml Nml Nml Nml Nml Nml Nml Nml Nml N/A      Waveforms:

## 2020-05-29 ENCOUNTER — Telehealth: Payer: Self-pay | Admitting: Neurology

## 2020-05-29 NOTE — Telephone Encounter (Signed)
Patient called in returning our call about some results

## 2020-05-29 NOTE — Telephone Encounter (Signed)
Left message for pt to call back if interested in an appt with Dr Marlou Porch for 5/19 - he is in the office in AM only.

## 2020-05-29 NOTE — Telephone Encounter (Signed)
Please see results note.

## 2020-05-29 NOTE — Progress Notes (Signed)
Pt advised of lab received.

## 2020-05-29 NOTE — Telephone Encounter (Signed)
Pt has been scheduled to see Dr Marlou Porch 5/19 in f/u

## 2020-05-30 ENCOUNTER — Encounter: Payer: Self-pay | Admitting: Cardiology

## 2020-05-30 ENCOUNTER — Other Ambulatory Visit: Payer: Self-pay

## 2020-05-30 ENCOUNTER — Ambulatory Visit: Payer: Medicare HMO | Admitting: Cardiology

## 2020-05-30 VITALS — BP 136/62 | HR 54 | Ht 67.0 in | Wt 240.2 lb

## 2020-05-30 DIAGNOSIS — R0600 Dyspnea, unspecified: Secondary | ICD-10-CM | POA: Diagnosis not present

## 2020-05-30 DIAGNOSIS — I4819 Other persistent atrial fibrillation: Secondary | ICD-10-CM | POA: Diagnosis not present

## 2020-05-30 DIAGNOSIS — R0609 Other forms of dyspnea: Secondary | ICD-10-CM

## 2020-05-30 NOTE — Patient Instructions (Signed)
Medication Instructions:  Your physician recommends that you continue on your current medications as directed. Please refer to the Current Medication list given to you today.  *If you need a refill on your cardiac medications before your next appointment, please call your pharmacy*   Lab Work: None If you have labs (blood work) drawn today and your tests are completely normal, you will receive your results only by: Marland Kitchen MyChart Message (if you have MyChart) OR . A paper copy in the mail If you have any lab test that is abnormal or we need to change your treatment, we will call you to review the results.   Testing/Procedures: None   Follow-Up: At [facility], you and your health needs are our priority.  As part of our continuing mission to provide you with exceptional heart care, we have created designated Provider Care Teams.  These Care Teams include your primary Cardiologist (physician) and Advanced Practice Providers (APPs -  Physician Assistants and Nurse Practitioners) who all work together to provide you with the care you need, when you need it.  We recommend signing up for the patient portal called "MyChart".  Sign up information is provided on this After Visit Summary.  MyChart is used to connect with patients for Virtual Visits (Telemedicine).  Patients are able to view lab/test results, encounter notes, upcoming appointments, etc.  Non-urgent messages can be sent to your provider as well.   To learn more about what you can do with MyChart, go to [email or web address].    Your next appointment:   6 month(s)  The format for your next appointment:   In Person  Provider:   You may see Candee Furbish, MD or one of the following Advanced Practice Providers on your designated Care Team:    Kathyrn Drown, NP    Other Instructions

## 2020-05-30 NOTE — Progress Notes (Signed)
Cardiology Office Note:    Date:  05/30/2020   ID:  [name] [name], DOB 29-May-1945, MRN [id number]  PCP:  Harlan Stains, MD   [facility] Providers Cardiologist:  Candee Furbish, MD     Referring MD: Harlan Stains, MD     History of Present Illness:    [name] [name] is a 75 y.o. female here for the follow-up of atrial fibrillation.  Has been on both amiodarone and Eliquis with no bleeding side effects.  Pulmonary has been following her with her amiodarone.  Decreased energy, shortness of breath with exertion has been chronic and longstanding.  She also sometimes feels some weakness in her legs when she is walking.  She has to stand for a few seconds before she gets moving.  She has had nerve conduction studies which were reassuring and normal.   Past Medical History:  Diagnosis Date  . Anxiety   . Arthritis   . Cataracts, bilateral    immature  . CKD (chronic kidney disease) stage 3, GFR 30-59 ml/min (HCC)   . Depression   . GERD (gastroesophageal reflux disease)    was on Nexium   . GERD (gastroesophageal reflux disease)   . Hemorrhoid   . History of bronchitis    couple of yrs ago  . History of colon polyps   . History of peristent atrial fibrillation   . Hyperlipidemia    takes Pravastatin daily  . Hypertension    takes Amlodipine and Diovan daily  . Hypertension   . Pre-diabetes   . Sleep apnea    no cpap used  . Sleep apnea    study done 20+yrs ago and doesn't use cpap  . Urinary urgency   . Weakness    in left leg r/t back    Past Surgical History:  Procedure Laterality Date  . ABDOMINAL HYSTERECTOMY  1980   partial  . ABDOMINAL HYSTERECTOMY     partial  . BACK SURGERY    . CARDIOVERSION N/A 02/24/2016   Procedure: CARDIOVERSION;  Surgeon: Fay Records, MD;  Location: [facility] ENDOSCOPY;  Service: Cardiovascular;  Laterality: N/A;  . CARDIOVERSION N/A 03/19/2016   Procedure: CARDIOVERSION;  Surgeon: Fay Records, MD;  Location: [place];  Service:  Cardiovascular;  Laterality: N/A;  . CARPAL TUNNEL RELEASE    . CHOLECYSTECTOMY  1975  . CHOLECYSTECTOMY    . COLONOSCOPY    . COLONOSCOPY WITH PROPOFOL  12/29/2011   Procedure: COLONOSCOPY WITH PROPOFOL;  Surgeon: Lear Ng, MD;  Location: [facility] ENDOSCOPY;  Service: Endoscopy;  Laterality: N/A;  . COLONOSCOPY WITH PROPOFOL N/A 05/24/2014   Procedure: COLONOSCOPY WITH PROPOFOL;  Surgeon: Wilford Corner, MD;  Location: [facility] ENDOSCOPY;  Service: Endoscopy;  Laterality: N/A;  . ESOPHAGEAL MANOMETRY N/A 02/22/2017   Procedure: ESOPHAGEAL MANOMETRY (EM);  Surgeon: Wilford Corner, MD;  Location: [facility] ENDOSCOPY;  Service: Endoscopy;  Laterality: N/A;  . ESOPHAGOGASTRODUODENOSCOPY    . FOOT SURGERY     left, right  . FRACTURE SURGERY     left leg-knee  . HEEL SPUR EXCISION Bilateral   . HOT HEMOSTASIS  12/29/2011   Procedure: HOT HEMOSTASIS (ARGON PLASMA COAGULATION/BICAP);  Surgeon: Lear Ng, MD;  Location: [facility] ENDOSCOPY;  Service: Endoscopy;  Laterality: N/A;  . HOT HEMOSTASIS N/A 05/24/2014   Procedure: HOT HEMOSTASIS (ARGON PLASMA COAGULATION/BICAP);  Surgeon: Wilford Corner, MD;  Location: [facility] ENDOSCOPY;  Service: Endoscopy;  Laterality: N/A;  . leg surgery d/t break Left   . LUMBAR LAMINECTOMY/DECOMPRESSION  MICRODISCECTOMY Left 04/05/2013   Procedure: LUMBAR FOUR TO FIVE LUMBAR LAMINECTOMY/DECOMPRESSION MICRODISCECTOMY 1 LEVEL;  Surgeon: Eustace Moore, MD;  Location: [facility]URO ORS;  Service: Neurosurgery;  Laterality: Left;  . NISSEN FUNDOPLICATION    . [place] IMPEDANCE STUDY N/A 02/22/2017   Procedure: [place] IMPEDANCE STUDY;  Surgeon: Wilford Corner, MD;  Location: [facility] ENDOSCOPY;  Service: Endoscopy;  Laterality: N/A;  . TEE WITHOUT CARDIOVERSION N/A 02/24/2016   Procedure: TRANSESOPHAGEAL ECHOCARDIOGRAM (TEE);  Surgeon: Fay Records, MD;  Location: [place];  Service: Cardiovascular;  Laterality: N/A;  . TOTAL KNEE ARTHROPLASTY Left 10/07/2016  . TOTAL KNEE ARTHROPLASTY Left  10/07/2016   Procedure: TOTAL KNEE ARTHROPLASTY;  Surgeon: [name], MD;  Location: [place];  Service: Orthopedics;  Laterality: Left;    Current Medications: Current Meds  Medication Sig  . albuterol (VENTOLIN HFA) 108 (90 Base) MCG/ACT inhaler Inhale 1-2 puffs into the lungs every 4 (four) hours as needed for wheezing or shortness of breath.  Marland Kitchen amiodarone (PACERONE) 200 MG tablet Take 0.5 tablets (100 mg total) by mouth daily.  Marland Kitchen amLODipine (NORVASC) 5 MG tablet Take 1 tablet (5 mg total) by mouth daily.  . Ascorbic Acid (VITAMIN C WITH ROSE HIPS) 1000 MG tablet Take 1,000 mg by mouth daily.  . benzonatate (TESSALON) 200 MG capsule Take 1 capsule by mouth three times daily as needed for cough  . buPROPion (WELLBUTRIN XL) 300 MG 24 hr tablet Take 1 tablet (300 mg total) by mouth daily.  Marland Kitchen CALCIUM PO Take 1 tablet by mouth every morning.  . chlorpheniramine (CHLOR-TRIMETON) 4 MG tablet Take 4 mg by mouth at bedtime.  . Cholecalciferol (VITAMIN D) 2000 UNITS tablet Take 4,000 Units by mouth daily.  . CVS MELATONIN PO Take 1 tablet by mouth at bedtime as needed.  . diazepam (VALIUM) 5 MG tablet Take 1 tablet 30 to 40 minutes prior to MRI  . ELIQUIS 5 MG TABS tablet TAKE 1 TABLET TWICE DAILY  . fexofenadine (ALLEGRA) 180 MG tablet Take 180 mg by mouth daily as needed for allergies or rhinitis.  . furosemide (LASIX) 20 MG tablet TAKE 1 TABLET EVERY DAY  . Glycopyrrolate-Formoterol (BEVESPI AEROSPHERE) 9-4.8 MCG/ACT AERO Inhale 2 puffs into the lungs 2 (two) times daily.  Marland Kitchen levothyroxine (SYNTHROID) 75 MCG tablet TAKE 1 TABLET (75 MCG TOTAL) BY MOUTH DAILY BEFORE BREAKFAST.  . metoprolol succinate (TOPROL-XL) 25 MG 24 hr tablet TAKE 1/2 TABLET ONE TIME DAILY  . potassium chloride SA (KLOR-CON) 20 MEQ tablet Take 1 tablet (20 mEq total) by mouth daily.  . pravastatin (PRAVACHOL) 40 MG tablet Take 40 mg by mouth at bedtime.  Marland Kitchen Spacer/Aero-Holding Chambers (AEROCHAMBER MV) inhaler Use as  instructed     Allergies:   Lexapro [escitalopram], Peroxide [hydrogen peroxide], Zoloft [sertraline hcl], and Neosporin [neomycin-bacitracin zn-polymyx]   Social History   Socioeconomic History  . Marital status: Married    Spouse name: Dominica Severin  . Number of children: 2  . Years of education: 51  . Highest education level: Some college, no degree  Occupational History  . Occupation: retired  Tobacco Use  . Smoking status: Never Smoker  . Smokeless tobacco: Never Used  Vaping Use  . Vaping Use: Never used  Substance and Sexual Activity  . Alcohol use: No  . Drug use: No  . Sexual activity: Yes    Birth control/protection: Surgical  Other Topics Concern  . Not on file  Social History Narrative   Right handed  Drinks caffeine   Two story home   Social Determinants of Radio broadcast assistant Strain: Not on file  Food Insecurity: Not on file  Transportation Needs: Not on file  Physical Activity: Not on file  Stress: Not on file  Social Connections: Not on file     Family History: The patient's family history includes Colon cancer in her mother; Heart disease in her father. There is no history of Breast cancer or Thyroid disease.  ROS:   Please see the history of present illness.     All other systems reviewed and are negative.  EKGs/Labs/Other Studies Reviewed:    The following studies were reviewed today:ECHO November 22, 2018:  1. Left ventricular ejection fraction, by visual estimation, is 60 to  65%. The left ventricle has normal function. There is no left ventricular  hypertrophy.  2. Elevated left atrial pressure.  3. Left ventricular diastolic parameters are consistent with Grade II  diastolic dysfunction (pseudonormalization). Very low A wave velocity  suggests recent atrial fibrillation and mechanical atrial stunning or  chronic left atrial failure.  4. The left ventricle has no regional wall motion abnormalities.  5. Global right ventricle has normal  systolic function.The right  ventricular size is normal. No increase in right ventricular wall  thickness.  6. Left atrial size was mildly dilated.  7. Right atrial size was mildly dilated.  8. Mild mitral annular calcification.  9. The mitral valve is normal in structure. Mild to moderate mitral valve  regurgitation. No evidence of mitral stenosis.  10. The tricuspid valve is normal in structure. Tricuspid valve  regurgitation is trivial.  11. The aortic valve is normal in structure. Aortic valve regurgitation is  not visualized. No evidence of aortic valve sclerosis or stenosis.  12. The pulmonic valve was normal in structure. Pulmonic valve  regurgitation is not visualized.  13. Mildly elevated pulmonary artery systolic pressure.  14. The inferior vena cava is normal in size with greater than 50%  respiratory variability, suggesting right atrial pressure of 3 mmHg.   FINDINGS    Recent Labs: 09/25/2019: TSH 0.65 11/02/2019: ALT 22; Hemoglobin 13.7; Platelets 200  Recent Lipid Panel    Component Value Date/Time   CHOL 125 02/18/2016 0454   TRIG 90 02/18/2016 0454   HDL 41 02/18/2016 0454   CHOLHDL 3.0 02/18/2016 0454   VLDL 18 02/18/2016 0454   LDLCALC 66 02/18/2016 0454     Risk Assessment/Calculations:      Physical Exam:    VS:  BP 136/62   Pulse (!) 54   Ht 5\' 7"  (1.702 m)   Wt 240 lb 3.2 oz (109 kg)   SpO2 98%   BMI 37.62 kg/m     Wt Readings from Last 3 Encounters:  05/30/20 240 lb 3.2 oz (109 kg)  02/20/20 244 lb (110.7 kg)  11/02/19 239 lb (108.4 kg)     GEN:  Well nourished, well developed in no acute distress HEENT: Normal NECK: No JVD; No carotid bruits LYMPHATICS: No lymphadenopathy CARDIAC: RRR, no murmurs, rubs, gallops RESPIRATORY:  Clear to auscultation without rales, wheezing or rhonchi  ABDOMEN: Soft, non-tender, non-distended MUSCULOSKELETAL:  No edema; No deformity  SKIN: Warm and dry NEUROLOGIC:  Alert and oriented x  3 PSYCHIATRIC:  Normal affect   ASSESSMENT:    1. Persistent atrial fibrillation ([place])   2. Dyspnea on exertion    PLAN:    In order of problems listed above:  Paroxysmal atrial fibrillation - Continue current  medical management with amiodarone, dose was decreased to 100 mg a day at prior clinic visit.  Pulmonary has been following.  Last TSH was excellent.  ALT 21-creatinine 1.1  Chronic anticoagulation - On Eliquis.  She did have a fall previously in the [place] parking lot.  Thankfully no broken bones or no evidence of brain injury.  Had some right periorbital ecchymosis at that time.  Hemoglobin has been stable, last 12.7.  Dyspnea on exertion - This has been longstanding followed by pulmonary.  Seems to be doing fairly reasonable.  She does use an inhaler occasionally when she feels some wheezing.  She states that she does need to follow-up with the pulmonologist.   Essential hypertension - Continuing with blood pressure medications.  Mitral regurgitation - Previously mild to moderate on 2020 echocardiogram reviewed.  This has been stable.      Medication Adjustments/Labs and Tests Ordered: Current medicines are reviewed at length with the patient today.  Concerns regarding medicines are outlined above.  No orders of the defined types were placed in this encounter.  No orders of the defined types were placed in this encounter.   Patient Instructions  Medication Instructions:  Your physician recommends that you continue on your current medications as directed. Please refer to the Current Medication list given to you today.  *If you need a refill on your cardiac medications before your next appointment, please call your pharmacy*   Lab Work: None If you have labs (blood work) drawn today and your tests are completely normal, you will receive your results only by: Marland Kitchen MyChart Message (if you have MyChart) OR . A paper copy in the mail If you have any lab test that is  abnormal or we need to change your treatment, we will call you to review the results.   Testing/Procedures: None   Follow-Up: At [facility], you and your health needs are our priority.  As part of our continuing mission to provide you with exceptional heart care, we have created designated Provider Care Teams.  These Care Teams include your primary Cardiologist (physician) and Advanced Practice Providers (APPs -  Physician Assistants and Nurse Practitioners) who all work together to provide you with the care you need, when you need it.  We recommend signing up for the patient portal called "MyChart".  Sign up information is provided on this After Visit Summary.  MyChart is used to connect with patients for Virtual Visits (Telemedicine).  Patients are able to view lab/test results, encounter notes, upcoming appointments, etc.  Non-urgent messages can be sent to your provider as well.   To learn more about what you can do with MyChart, go to [email or web address].    Your next appointment:   6 month(s)  The format for your next appointment:   In Person  Provider:   You may see Candee Furbish, MD or one of the following Advanced Practice Providers on your designated Care Team:    Kathyrn Drown, NP    Other Instructions      Signed, Candee Furbish, MD  05/30/2020 10:21 AM    [place]

## 2020-06-18 ENCOUNTER — Telehealth: Payer: Self-pay | Admitting: Adult Health

## 2020-06-18 NOTE — Telephone Encounter (Signed)
ATC Patient.  Received busy signal from home number and left message to call back on cell phone.

## 2020-06-18 NOTE — Telephone Encounter (Signed)
ATC Patient on home number x's 3 and it was busy.  ATC Patient on cell number and LM to call back.

## 2020-06-18 NOTE — Telephone Encounter (Signed)
ATC home # and reached a busy signal.  Called cell #, went to VM, left message to return call.

## 2020-06-19 ENCOUNTER — Telehealth: Payer: Self-pay | Admitting: Adult Health

## 2020-06-19 DIAGNOSIS — K120 Recurrent oral aphthae: Secondary | ICD-10-CM | POA: Diagnosis not present

## 2020-06-19 NOTE — Telephone Encounter (Signed)
Spoke with the pt  She is c/o increased SOB x 10 days  She states she is getting winded walking room to room at home sometimes  She states not having any CP, f/c/s, aches, cough  She is is fully vaxxed  Seen PCP today and they rec appt with [place]  Scheduled her with TP for 06/21/20  Seek emergent care sooner if needed

## 2020-06-21 ENCOUNTER — Encounter: Payer: Self-pay | Admitting: Adult Health

## 2020-06-21 ENCOUNTER — Other Ambulatory Visit: Payer: Self-pay

## 2020-06-21 ENCOUNTER — Ambulatory Visit (INDEPENDENT_AMBULATORY_CARE_PROVIDER_SITE_OTHER): Payer: Medicare HMO

## 2020-06-21 ENCOUNTER — Telehealth: Payer: Self-pay | Admitting: Endocrinology

## 2020-06-21 ENCOUNTER — Ambulatory Visit (INDEPENDENT_AMBULATORY_CARE_PROVIDER_SITE_OTHER): Payer: Medicare HMO | Admitting: Adult Health

## 2020-06-21 VITALS — BP 120/70 | HR 62 | Ht 67.0 in | Wt 235.0 lb

## 2020-06-21 DIAGNOSIS — R0600 Dyspnea, unspecified: Secondary | ICD-10-CM | POA: Diagnosis not present

## 2020-06-21 DIAGNOSIS — R042 Hemoptysis: Secondary | ICD-10-CM | POA: Diagnosis not present

## 2020-06-21 DIAGNOSIS — R058 Other specified cough: Secondary | ICD-10-CM

## 2020-06-21 DIAGNOSIS — J209 Acute bronchitis, unspecified: Secondary | ICD-10-CM | POA: Insufficient documentation

## 2020-06-21 DIAGNOSIS — R053 Chronic cough: Secondary | ICD-10-CM | POA: Diagnosis not present

## 2020-06-21 DIAGNOSIS — R0609 Other forms of dyspnea: Secondary | ICD-10-CM

## 2020-06-21 DIAGNOSIS — I517 Cardiomegaly: Secondary | ICD-10-CM | POA: Diagnosis not present

## 2020-06-21 DIAGNOSIS — E032 Hypothyroidism due to medicaments and other exogenous substances: Secondary | ICD-10-CM

## 2020-06-21 HISTORY — DX: Acute bronchitis, unspecified: J20.9

## 2020-06-21 MED ORDER — CEFDINIR 300 MG PO CAPS: 300.0000 mg | ORAL_CAPSULE | Freq: Two times a day (BID) | ORAL | 0 refills | Status: DC

## 2020-06-21 MED ORDER — PREDNISONE 20 MG PO TABS: 20.0000 mg | ORAL_TABLET | Freq: Every day | ORAL | 0 refills | Status: DC

## 2020-06-21 NOTE — Patient Instructions (Addendum)
Omnicef 300mg  Twice daily  for 1 week, take with food.  Mucinex DM Twice daily As needed  cough /congestion  Prednisone 20mg  daily for 5 days  Albuterol inhaler 1-2 puffs every 6hrs as needed wheezing /shortness of breath .  Activity as tolerated.  Follow up with Dr. Annamaria Boots  in 6-8 weeks and As needed   Please contact office for sooner follow up if symptoms do not improve or worsen or seek emergency care

## 2020-06-21 NOTE — Progress Notes (Signed)
$'@Patient'p$  ID: [name] [name], female    DOB: [birth year], 75 y.o.   MRN: [id number]  Chief Complaint  Patient presents with   Follow-up     Referring provider: Harlan Stains, MD  HPI: 75 year old female never smoker followed for chronic cough (developed around 2009) Medical history significant for A. fib on amiodarone-started February 2019, on anticoagulation  obstructive sleep apnea-CPAP intolerant   TEST/EVENTS :  High-resolution CT chest in March 2019 note showed no evidence of interstitial lung disease.  Allergy profile showed IgE, ESR and CBC with differential were normal February 2019.  Pulmonary function testing and 2020 showed mild to moderate restriction with FEV1 at 76% ratio 81, FVC 71% DLCO was 70% this was similar to 2018 PFT.    Chronic Cough workup :  Chronic cough hx  Neg CXR ENT evaluation 2019 neg per pt GI w/up neg for GERD per pt Basement but does not use No hot tub use( has one but not functioning ) No chickens, birds, occupatonal /hobby exposure. No  Autoimmune dz  , no macrodantin use.  On amiodarone 02/2017   PFT October 2018 showed FEV1 at 88%, ratio 80, FVC 83%, no significant bronchodilator response ,mid flow reversibility, DLCO 71%  CT chest March 26, 2017> negative for ILD, patchy air trapping in both lungs, thin parenchymal bands in the mid to lower lungs compatible with a mild postinfectious or postinflammatory scarring, 3 mm right lower lobe nodule. ESR March 2019 normal March 2019 allergy profile and IgE and CBC with differential normal  06/21/2020 Follow up: Dyspnea , Cough  Patient presents for a follow-up visit.  Last seen January 2021 .  She is followed for chronic cough present since 2009 . She complains over the last 2 weeks that she has had increased shortness of breath with activity has had an increased cough and seeing some blood-tinged mucus on 1 episode.  She denied any fever, chest pain, orthopnea, increased edema.  No calf pain. No  dyspnea at rest.  She is fully vaccinated for COVID-19 including 1 booster. Appetite is good with no nausea vomiting diarrhea.  She says she is used her albuterol with some improvement.  Chest x-ray done today in office and independently reviewed with no acute process noted Patient is followed for chronic cough.  She says up until the last couple weeks her cough has been doing exceptionally well.  She remains on Allegra 180 mg daily and Chlor-Trimeton 4 mg at bedtime she has Delsym and Tessalon as needed for cough.  She is followed by cardiology for A. fib on amiodarone and Eliquis.  She says that she has been stable with no palpitations.  Recent cardiology notes last month showed stability.     Allergies  Allergen Reactions   Lexapro [Escitalopram]     Makes her feel funny   Peroxide [Hydrogen Peroxide] Other (See Comments)    Redness.    Zoloft [Sertraline Hcl]     Makes her feel funny   Neosporin [Neomycin-Bacitracin Zn-Polymyx] Rash    Blisters, itching.     Immunization History  Administered Date(s) Administered   Fluad Quad(high Dose 65+) 10/13/2018   Influenza, High Dose Seasonal PF 10/09/2016, 10/12/2017, 11/06/2017   PFIZER(Purple Top)SARS-COV-2 Vaccination 02/26/2019, 04/05/2019   Zoster Recombinat (Shingrix) 02/09/2018, 08/08/2018    Past Medical History:  Diagnosis Date   Anxiety    Arthritis    Cataracts, bilateral    immature   CKD (chronic kidney disease) stage 3, GFR 30-59 ml/min (HCC)  Depression    GERD (gastroesophageal reflux disease)    was on Nexium    GERD (gastroesophageal reflux disease)    Hemorrhoid    History of bronchitis    couple of yrs ago   History of colon polyps    History of peristent atrial fibrillation    Hyperlipidemia    takes Pravastatin daily   Hypertension    takes Amlodipine and Diovan daily   Hypertension    Pre-diabetes    Sleep apnea    no cpap used   Sleep apnea    study done 20+yrs ago and doesn't use cpap    Urinary urgency    Weakness    in left leg r/t back    Tobacco History: Social History   Tobacco Use  Smoking Status Never  Smokeless Tobacco Never   Counseling given: Not Answered   Outpatient Medications Prior to Visit  Medication Sig Dispense Refill   albuterol (VENTOLIN HFA) 108 (90 Base) MCG/ACT inhaler Inhale 1-2 puffs into the lungs every 4 (four) hours as needed for wheezing or shortness of breath. 1 g 1   amiodarone (PACERONE) 200 MG tablet Take 0.5 tablets (100 mg total) by mouth daily. 45 tablet 2   amLODipine (NORVASC) 5 MG tablet Take 1 tablet (5 mg total) by mouth daily. 90 tablet 3   Ascorbic Acid (VITAMIN C WITH ROSE HIPS) 1000 MG tablet Take 1,000 mg by mouth daily.     benzonatate (TESSALON) 200 MG capsule Take 1 capsule by mouth three times daily as needed for cough 60 capsule 0   buPROPion (WELLBUTRIN XL) 300 MG 24 hr tablet Take 1 tablet (300 mg total) by mouth daily. 90 tablet 0   CALCIUM PO Take 1 tablet by mouth every morning.     chlorpheniramine (CHLOR-TRIMETON) 4 MG tablet Take 4 mg by mouth at bedtime.     Cholecalciferol (VITAMIN D) 2000 UNITS tablet Take 4,000 Units by mouth daily.     [employer] MELATONIN PO Take 1 tablet by mouth at bedtime as needed.     diazepam (VALIUM) 5 MG tablet Take 1 tablet 30 to 40 minutes prior to MRI 1 tablet 0   ELIQUIS 5 MG TABS tablet TAKE 1 TABLET TWICE DAILY 180 tablet 1   fexofenadine (ALLEGRA) 180 MG tablet Take 180 mg by mouth daily as needed for allergies or rhinitis.     furosemide (LASIX) 20 MG tablet TAKE 1 TABLET EVERY DAY 90 tablet 3   levothyroxine (SYNTHROID) 75 MCG tablet TAKE 1 TABLET (75 MCG TOTAL) BY MOUTH DAILY BEFORE BREAKFAST. 90 tablet 0   metoprolol succinate (TOPROL-XL) 25 MG 24 hr tablet TAKE 1/2 TABLET ONE TIME DAILY 45 tablet 3   potassium chloride SA (KLOR-CON) 20 MEQ tablet Take 1 tablet (20 mEq total) by mouth daily. 90 tablet 2   pravastatin (PRAVACHOL) 40 MG tablet Take 40 mg by mouth at bedtime.      Spacer/Aero-Holding Chambers (AEROCHAMBER MV) inhaler Use as instructed 1 each 0   Glycopyrrolate-Formoterol (BEVESPI AEROSPHERE) 9-4.8 MCG/ACT AERO Inhale 2 puffs into the lungs 2 (two) times daily. 5.9 g 0   No facility-administered medications prior to visit.     Review of Systems:   Constitutional:   No  weight loss, night sweats,  Fevers, chills, f +atigue, or  lassitude.  HEENT:   No headaches,  Difficulty swallowing,  Tooth/dental problems, or  Sore throat,  No sneezing, itching, ear ache, nasal congestion, post nasal drip,   CV:  No chest pain,  Orthopnea, PND, swelling in lower extremities, anasarca, dizziness, palpitations, syncope.   GI  No heartburn, indigestion, abdominal pain, nausea, vomiting, diarrhea, change in bowel habits, loss of appetite, bloody stools.   Resp:   No excess mucus, no productive cough,  No non-productive cough,  No coughing up of blood.  No change in color of mucus.  No wheezing.  No chest wall deformity  Skin: no rash or lesions.  GU: no dysuria, change in color of urine, no urgency or frequency.  No flank pain, no hematuria   MS:  No joint pain or swelling.  No decreased range of motion.  No back pain.    Physical Exam  BP 120/70   Pulse 62   Ht 5' 7"  (1.702 m)   Wt 235 lb (106.6 kg)   SpO2 95%   BMI 36.81 kg/m   GEN: A/Ox3; pleasant , NAD, well nourished    HEENT:  [place]/AT, , NOSE-clear, THROAT-clear, no lesions, no postnasal drip or exudate noted.   NECK:  Supple w/ fair ROM; no JVD; normal carotid impulses w/o bruits; no thyromegaly or nodules palpated; no lymphadenopathy.    RESP  Clear  P & A; w/o, wheezes/ rales/ or rhonchi. no accessory muscle use, no dullness to percussion  CARD:  RRR, no m/r/g, no peripheral edema, pulses intact, no cyanosis or clubbing.  GI:   Soft & nt; nml bowel sounds; no organomegaly or masses detected.   Musco: Warm bil, no deformities or joint swelling noted.   Neuro: alert, no  focal deficits noted.    Skin: Warm, no lesions or rashes    Lab Results:  CBC    Component Value Date/Time   WBC 5.8 11/02/2019 1151   WBC 5.6 03/19/2017 1636   RBC 4.35 11/02/2019 1151   RBC 4.25 03/19/2017 1636   HGB 13.7 11/02/2019 1151   HCT 41.7 11/02/2019 1151   PLT 200 11/02/2019 1151   MCV 96 11/02/2019 1151   MCH 31.5 11/02/2019 1151   MCH 30.6 10/09/2016 0611   MCHC 32.9 11/02/2019 1151   MCHC 33.5 03/19/2017 1636   RDW 13.2 11/02/2019 1151   LYMPHSABS 2.1 03/19/2017 1636   MONOABS 0.7 03/19/2017 1636   EOSABS 0.3 03/19/2017 1636   BASOSABS 0.1 03/19/2017 1636    BMET    Component Value Date/Time   NA 143 11/15/2017 1049   K 4.2 11/15/2017 1049   CL 102 11/15/2017 1049   CO2 25 11/15/2017 1049   GLUCOSE 127 (H) 11/15/2017 1049   GLUCOSE 118 (H) 10/09/2016 0611   BUN 14 11/15/2017 1049   CREATININE 1.33 (H) 11/15/2017 1049   CALCIUM 9.1 11/15/2017 1049   GFRNONAA 40 (L) 11/15/2017 1049   GFRAA 46 (L) 11/15/2017 1049    BNP    Component Value Date/Time   BNP 150.2 (H) 02/17/2016 1644    ProBNP No results found for: PROBNP  Imaging: DG Chest 2 View  Result Date: 06/21/2020 CLINICAL DATA:  Dyspnea.  Hemoptysis. EXAM: CHEST - 2 VIEW COMPARISON:  November 09, 2016. FINDINGS: Stable cardiomegaly. Both lungs are clear. The visualized skeletal structures are unremarkable. IMPRESSION: No active cardiopulmonary disease. Electronically Signed   By: Marijo Conception M.D.   On: 06/21/2020 10:44   NCV with EMG(electromyography)  Result Date: 05/28/2020 Alda Berthold, DO     05/28/2020 12:51 PM [facility] [address]  [address], [place]  [place], [place] [address] Tel: [phone] Fax:  [phone] Test Date:  05/28/2020 Patient: Kyiah Canepa DOB: [birth year] Physician: Narda Amber, DO Sex: Female Height: $RemoveBefore'5\' 7"'[id number]$  Ref Phys: Metta Clines, D.O. ID#: [id number]   Technician:  Patient Complaints: This is a 75 year old female referred for evaluation of gait  unsteadiness. NCV & EMG Findings: Electrodiagnostic testing of the right lower extremity and additional studies of the left shows: 1. Bilateral sural and superficial peroneal sensory responses are within normal limits. 2. Bilateral peroneal and tibial motor responses are within normal limits. 3. Bilateral tibial H reflex studies are within normal limits. 4. There is no evidence of active or chronic motor axonal changes affecting any of the tested muscles.  Motor unit configuration and recruitment pattern is within normal limits.  Proximal and deep muscles were not tested as the patient is on anticoagulation therapy. Impression: This is a normal study of the lower extremities.  In particular, there is no evidence of a sensorimotor polyneuropathy or lumbosacral radiculopathy. ___________________________ Narda Amber, DO Nerve Conduction Studies Anti Sensory Summary Table  Stim Site NR Peak (ms) Norm Peak (ms) P-T Amp (V) Norm P-T Amp Left Sup Peroneal Anti Sensory (Ant Lat Mall)  32C 12 cm    2.3 <4.6 8.3 >3 Right Sup Peroneal Anti Sensory (Ant Lat Mall)  32C 12 cm    2.4 <4.6 8.8 >3 Left Sural Anti Sensory (Lat Mall)  32C Calf    2.2 <4.6 12.6 >3 Right Sural Anti Sensory (Lat Mall)  32C Calf    3.0 <4.6 12.2 >3 Motor Summary Table  Stim Site NR Onset (ms) Norm Onset (ms) O-P Amp (mV) Norm O-P Amp Site1 Site2 Delta-0 (ms) Dist (cm) Vel (m/s) Norm Vel (m/s) Left Peroneal Motor (Ext Dig Brev)  32C Ankle    4.7 <6.0 2.7 >2.5 B Fib Ankle 5.8 33.0 57 >40 B Fib    10.5  2.6  Poplt B Fib 1.5 8.0 53 >40 Poplt    12.0  2.6        Right Peroneal Motor (Ext Dig Brev)  32C Ankle    3.7 <6.0 3.0 >2.5 B Fib Ankle 7.2 33.0 46 >40 B Fib    10.9  2.6  Poplt B Fib 1.4 8.0 57 >40 Poplt    12.3  2.5        Left Tibial Motor (Abd Hall Brev)  32C Ankle    4.4 <6.0 7.7 >4 Knee Ankle 9.4 42.0 45 >40 Knee    13.8  4.3        Right Tibial Motor (Abd Hall Brev)  32C Ankle    4.0 <6.0 9.2 >4 Knee Ankle 8.1 43.0 53 >40 Knee    12.1  6.6         H Reflex Studies  NR H-Lat (ms) Lat Norm (ms) L-R H-Lat (ms) Left Tibial (Gastroc)  32C    32.93 <35 0.00 Right Tibial (Gastroc)  32C    32.93 <35 0.00 EMG  Side Muscle Ins Act Fibs Psw Fasc Number Recrt Dur Dur. Amp Amp. Poly Poly. Comment Right AntTibialis Nml Nml Nml Nml Nml Nml Nml Nml Nml Nml Nml Nml N/A Right Gastroc Nml Nml Nml Nml Nml Nml Nml Nml Nml Nml Nml Nml N/A Right RectFemoris Nml Nml Nml Nml Nml Nml Nml Nml Nml Nml Nml Nml N/A Left AntTibialis Nml Nml Nml Nml Nml Nml Nml Nml Nml Nml Nml Nml N/A Left Gastroc Nml Nml Nml Nml Nml  Nml Nml Nml Nml Nml Nml Nml N/A Left RectFemoris Nml Nml Nml Nml Nml Nml Nml Nml Nml Nml Nml Nml N/A Waveforms:                PFT Results Latest Ref Rng & Units 01/19/2018 11/04/2016 02/19/2016  FVC-Pre L 2.42 2.64 2.06  FVC-Predicted Pre % 71 78 61  FVC-Post L 2.43 2.80 -  FVC-Predicted Post % 71 83 -  Pre FEV1/FVC % % 77 78 77  Post FEV1/FCV % % 81 80 -  FEV1-Pre L 1.87 2.06 1.59  FEV1-Predicted Pre % 73 81 62  FEV1-Post L 1.95 2.25 -  DLCO uncorrected ml/min/mmHg 20.33 20.36 23.30  DLCO UNC% % 70 71 82  DLCO corrected ml/min/mmHg - 21.66 22.83  DLCO COR %Predicted % - 76 80  DLVA Predicted % 88 87 109  TLC L 5.26 - 5.31  TLC % Predicted % 95 - 96  RV % Predicted % 114 - 117    Lab Results  Component Value Date   NITRICOXIDE 15 03/19/2017        Assessment & Plan:   Acute bronchitis Acute bronchitis.  Chest x-ray today is clear with no acute process noted.  We will treat with empiric antibiotics and a short steroid course.  Added mucolytic's. Check PFTs on return.  Plan  Patient Instructions  Omnicef 351m Twice daily  for 1 week, take with food.  Mucinex DM Twice daily As needed  cough /congestion  Prednisone 264mdaily for 5 days  Albuterol inhaler 1-2 puffs every 6hrs as needed wheezing /shortness of breath .  Activity as tolerated.  Follow up with Dr. YoAnnamaria Bootsin 6-8 weeks and As needed   Please contact office for sooner  follow up if symptoms do not improve or worsen or seek emergency care        Dyspnea on exertion Suspect is multifactorial with multiple comorbidities probable deconditioning.  She also has an acute bronchitis. -Treat with empiric antibiotics and steroids.  Patient is on amiodarone.  We will check PFTs with DLCO on return.  For now continue on albuterol as needed. Previous high-res CT chest showed no evidence of interstitial lung disease or pneumonitis.  Plan  Patient Instructions  Omnicef 30032mwice daily  for 1 week, take with food.  Mucinex DM Twice daily As needed  cough /congestion  Prednisone 27m11mily for 5 days  Albuterol inhaler 1-2 puffs every 6hrs as needed wheezing /shortness of breath .  Activity as tolerated.  Follow up with Dr. YounAnnamaria Boots 6-8 weeks and As needed   Please contact office for sooner follow up if symptoms do not improve or worsen or seek emergency care       Upper airway cough syndrome Patient has chronic cough dating back to 2009.  Has been doing well with her current maintenance regimen.  Now with a recent flare with associated bronchitis.  Patient is to continue with her cough suppression regimen.  Treated with empiric antibiotics and steroid.  Follow-up in 6 to 8 weeks with PFTs    I spent   45 minutes dedicated to the care of this patient on the date of this encounter to include pre-visit review of records, face-to-face time with the patient discussing conditions above, post visit ordering of testing, clinical documentation with the electronic health record, making appropriate referrals as documented, and communicating necessary findings to members of the patients care team.   TammRexene Edison 06/21/2020

## 2020-06-21 NOTE — Assessment & Plan Note (Signed)
Patient has chronic cough dating back to 2009.  Has been doing well with her current maintenance regimen.  Now with a recent flare with associated bronchitis.  Patient is to continue with her cough suppression regimen.  Treated with empiric antibiotics and steroid.  Follow-up in 6 to 8 weeks with PFTs

## 2020-06-21 NOTE — Assessment & Plan Note (Signed)
Suspect is multifactorial with multiple comorbidities probable deconditioning.  She also has an acute bronchitis. -Treat with empiric antibiotics and steroids.  Patient is on amiodarone.  We will check PFTs with DLCO on return.  For now continue on albuterol as needed. Previous high-res CT chest showed no evidence of interstitial lung disease or pneumonitis.  Plan  Patient Instructions  Omnicef 300mg  Twice daily  for 1 week, take with food.  Mucinex DM Twice daily As needed  cough /congestion  Prednisone 20mg  daily for 5 days  Albuterol inhaler 1-2 puffs every 6hrs as needed wheezing /shortness of breath .  Activity as tolerated.  Follow up with Dr. Annamaria Boots  in 6-8 weeks and As needed   Please contact office for sooner follow up if symptoms do not improve or worsen or seek emergency care

## 2020-06-21 NOTE — Assessment & Plan Note (Signed)
Acute bronchitis.  Chest x-ray today is clear with no acute process noted.  We will treat with empiric antibiotics and a short steroid course.  Added mucolytic's. Check PFTs on return.  Plan  Patient Instructions  Omnicef 300mg  Twice daily  for 1 week, take with food.  Mucinex DM Twice daily As needed  cough /congestion  Prednisone 20mg  daily for 5 days  Albuterol inhaler 1-2 puffs every 6hrs as needed wheezing /shortness of breath .  Activity as tolerated.  Follow up with Dr. Annamaria Boots  in 6-8 weeks and As needed   Please contact office for sooner follow up if symptoms do not improve or worsen or seek emergency care

## 2020-06-24 ENCOUNTER — Telehealth: Payer: Self-pay | Admitting: Internal Medicine

## 2020-06-24 NOTE — Telephone Encounter (Signed)
Tried calling the pt x 4 and each time her line was busy. Will call back.

## 2020-06-25 NOTE — Telephone Encounter (Signed)
ATC, left VM for callback on cell phone. Called home phone, busy tone.

## 2020-06-25 NOTE — Telephone Encounter (Signed)
TP - please advise. Thanks. 

## 2020-06-25 NOTE — Telephone Encounter (Signed)
Due to 3 unsuccessful attempts to call patient will close this encounter.

## 2020-06-25 NOTE — Telephone Encounter (Signed)
ATC on home # [phone] , line was busy.  Called cell # [phone], no answer, left vm to return call.

## 2020-06-26 MED ORDER — PREDNISONE 20 MG PO TABS: 20.0000 mg | ORAL_TABLET | Freq: Every day | ORAL | 0 refills | Status: DC

## 2020-06-26 MED ORDER — DOXYCYCLINE HYCLATE 100 MG PO TABS: 100.0000 mg | ORAL_TABLET | Freq: Two times a day (BID) | ORAL | 0 refills | Status: DC

## 2020-06-26 NOTE — Telephone Encounter (Signed)
She was prescribed Omnicef please place this on her allergy list advised her not to take any of this anymore.  Also to alert her pharmacy so they will place it on her allergy list [name] [name] is a cephalosporin not a penicillin drug. Neither cephalosporins or penicillin are listed on her allergy list please verify if she is also allergic to penicillin if so please place it on her allergy list  Sorry to hear that she had a reaction.  Please have her take Zyrtec 10 mg daily for 3 days and Pepcid 10 mg daily for 3 days Please extend prednisone 20 mg daily for 5 additional days If her tongue swelling is not totally resolved will need an office visit for further evaluation and possible steroid injection and additional medications  If symptoms worsen she will need office visit or emergency room visit.   Patient is unable to take penicillin, cephalosporins or Z-Pak  Can begin doxycycline 100 mg twice daily for 7 days take with food.  Wear sunscreen if outside.  Please verify that she is never had a reaction to this Patient needs an office visit in 1 week for follow-up or sooner if needed  .Please contact office for sooner follow up if symptoms do not improve or worsen or seek emergency care

## 2020-06-26 NOTE — Telephone Encounter (Signed)
Pt called the office. I read off to her the info stated by TP about recs and also that she needs to stop the omnicef. Asked her if she was allergic to penicillin and pt said that she was, did not remember what the reaction was that she had as she hasn't taken it in at least 20 years. I have also added penicillin to pt's allergy list.   Stated to pt that we were going to send doxycycline to pharmacy for her and she verbalized understanding. Also have scheduled pt an appt with Beth next Thursday to reevaluate to see how pt is doing. Nothing further needed.

## 2020-06-26 NOTE — Telephone Encounter (Signed)
Another mychart message was sent by pt today 6/15 wanting recommendations. Tammy, please see that as well as pt's other messages and advise.

## 2020-06-27 ENCOUNTER — Ambulatory Visit: Payer: Medicare HMO | Admitting: Family

## 2020-07-02 DIAGNOSIS — F419 Anxiety disorder, unspecified: Secondary | ICD-10-CM | POA: Diagnosis not present

## 2020-07-02 DIAGNOSIS — F331 Major depressive disorder, recurrent, moderate: Secondary | ICD-10-CM | POA: Diagnosis not present

## 2020-07-04 ENCOUNTER — Ambulatory Visit: Payer: Medicare HMO | Admitting: Primary Care

## 2020-07-04 NOTE — Progress Notes (Deleted)
@Patient  ID: [name] [name], female    DOB: [birth year], 75 y.o.   MRN: [id number]  No chief complaint on file.   Referring provider: Harlan Stains, MD  HPI: 75 year old female, never smoked.  Past medical history significant for hypertension, persistent A. fib, heart failure with preserved ejection fraction, obstructive sleep apnea, chronic rhinitis, GERD, hypothyroidism.  Patient of Dr. Annamaria Boots, last seen by pulmonary nurse practitioner in 06/21/2020 for acute bronchitis.  07/04/2020 Patient was prescribed Omnicef 2 weeks ago for acute bronchitis but had an allergic reaction to this medication, and was changed to Doxycycline. She was also given course of prednisone.       Allergies  Allergen Reactions   Omnicef [Cefdinir] Swelling    Tongue swelling   Lexapro [Escitalopram]     Makes her feel funny   Penicillins     Pt cannot remember reaction she had as hadn't been on it in about 20 years but stated that she knows she is allergic to it.   Peroxide [Hydrogen Peroxide] Other (See Comments)    Redness.    Zoloft [Sertraline Hcl]     Makes her feel funny   Neosporin [Neomycin-Bacitracin Zn-Polymyx] Rash    Blisters, itching.     Immunization History  Administered Date(s) Administered   Fluad Quad(high Dose 65+) 10/13/2018   Influenza, High Dose Seasonal PF 10/09/2016, 10/12/2017, 11/06/2017   PFIZER(Purple Top)SARS-COV-2 Vaccination 02/26/2019, 04/05/2019   Zoster Recombinat (Shingrix) 02/09/2018, 08/08/2018    Past Medical History:  Diagnosis Date   Anxiety    Arthritis    Cataracts, bilateral    immature   CKD (chronic kidney disease) stage 3, GFR 30-59 ml/min (HCC)    Depression    GERD (gastroesophageal reflux disease)    was on Nexium    GERD (gastroesophageal reflux disease)    Hemorrhoid    History of bronchitis    couple of yrs ago   History of colon polyps    History of peristent atrial fibrillation    Hyperlipidemia    takes Pravastatin daily    Hypertension    takes Amlodipine and Diovan daily   Hypertension    Pre-diabetes    Sleep apnea    no cpap used   Sleep apnea    study done 20+yrs ago and doesn't use cpap   Urinary urgency    Weakness    in left leg r/t back    Tobacco History: Social History   Tobacco Use  Smoking Status Never  Smokeless Tobacco Never   Counseling given: Not Answered   Outpatient Medications Prior to Visit  Medication Sig Dispense Refill   albuterol (VENTOLIN HFA) 108 (90 Base) MCG/ACT inhaler Inhale 1-2 puffs into the lungs every 4 (four) hours as needed for wheezing or shortness of breath. 1 g 1   amiodarone (PACERONE) 200 MG tablet Take 0.5 tablets (100 mg total) by mouth daily. 45 tablet 2   amLODipine (NORVASC) 5 MG tablet Take 1 tablet (5 mg total) by mouth daily. 90 tablet 3   Ascorbic Acid (VITAMIN C WITH ROSE HIPS) 1000 MG tablet Take 1,000 mg by mouth daily.     benzonatate (TESSALON) 200 MG capsule Take 1 capsule by mouth three times daily as needed for cough 60 capsule 0   buPROPion (WELLBUTRIN XL) 300 MG 24 hr tablet Take 1 tablet (300 mg total) by mouth daily. 90 tablet 0   CALCIUM PO Take 1 tablet by mouth every morning.     chlorpheniramine (CHLOR-TRIMETON)  4 MG tablet Take 4 mg by mouth at bedtime.     Cholecalciferol (VITAMIN D) 2000 UNITS tablet Take 4,000 Units by mouth daily.     [facility] MELATONIN PO Take 1 tablet by mouth at bedtime as needed.     diazepam (VALIUM) 5 MG tablet Take 1 tablet 30 to 40 minutes prior to MRI 1 tablet 0   doxycycline (VIBRA-TABS) 100 MG tablet Take 1 tablet (100 mg total) by mouth 2 (two) times daily. 14 tablet 0   ELIQUIS 5 MG TABS tablet TAKE 1 TABLET TWICE DAILY 180 tablet 1   fexofenadine (ALLEGRA) 180 MG tablet Take 180 mg by mouth daily as needed for allergies or rhinitis.     furosemide (LASIX) 20 MG tablet TAKE 1 TABLET EVERY DAY 90 tablet 3   levothyroxine (SYNTHROID) 75 MCG tablet TAKE 1 TABLET (75 MCG TOTAL) BY MOUTH DAILY BEFORE  BREAKFAST. 90 tablet 0   metoprolol succinate (TOPROL-XL) 25 MG 24 hr tablet TAKE 1/2 TABLET ONE TIME DAILY 45 tablet 3   potassium chloride SA (KLOR-CON) 20 MEQ tablet Take 1 tablet (20 mEq total) by mouth daily. 90 tablet 2   pravastatin (PRAVACHOL) 40 MG tablet Take 40 mg by mouth at bedtime.     predniSONE (DELTASONE) 20 MG tablet Take 1 tablet (20 mg total) by mouth daily with breakfast. 5 tablet 0   Spacer/Aero-Holding Chambers (AEROCHAMBER MV) inhaler Use as instructed 1 each 0   No facility-administered medications prior to visit.      Review of Systems  Review of Systems   Physical Exam  There were no vitals taken for this visit. Physical Exam   Lab Results:  CBC    Component Value Date/Time   WBC 5.8 11/02/2019 1151   WBC 5.6 03/19/2017 1636   RBC 4.35 11/02/2019 1151   RBC 4.25 03/19/2017 1636   HGB 13.7 11/02/2019 1151   HCT 41.7 11/02/2019 1151   PLT 200 11/02/2019 1151   MCV 96 11/02/2019 1151   MCH 31.5 11/02/2019 1151   MCH 30.6 10/09/2016 0611   MCHC 32.9 11/02/2019 1151   MCHC 33.5 03/19/2017 1636   RDW 13.2 11/02/2019 1151   LYMPHSABS 2.1 03/19/2017 1636   MONOABS 0.7 03/19/2017 1636   EOSABS 0.3 03/19/2017 1636   BASOSABS 0.1 03/19/2017 1636    BMET    Component Value Date/Time   NA 143 11/15/2017 1049   K 4.2 11/15/2017 1049   CL 102 11/15/2017 1049   CO2 25 11/15/2017 1049   GLUCOSE 127 (H) 11/15/2017 1049   GLUCOSE 118 (H) 10/09/2016 0611   BUN 14 11/15/2017 1049   CREATININE 1.33 (H) 11/15/2017 1049   CALCIUM 9.1 11/15/2017 1049   GFRNONAA 40 (L) 11/15/2017 1049   GFRAA 46 (L) 11/15/2017 1049    BNP    Component Value Date/Time   BNP 150.2 (H) 02/17/2016 1644    ProBNP No results found for: PROBNP  Imaging: DG Chest 2 View  Result Date: 06/21/2020 CLINICAL DATA:  Dyspnea.  Hemoptysis. EXAM: CHEST - 2 VIEW COMPARISON:  November 09, 2016. FINDINGS: Stable cardiomegaly. Both lungs are clear. The visualized skeletal  structures are unremarkable. IMPRESSION: No active cardiopulmonary disease. Electronically Signed   By: Marijo Conception M.D.   On: 06/21/2020 10:44     Assessment & Plan:   No problem-specific Assessment & Plan notes found for this encounter.     Martyn Ehrich, NP 07/04/2020

## 2020-07-09 ENCOUNTER — Encounter: Payer: Medicare HMO | Admitting: Neurology

## 2020-07-09 NOTE — Telephone Encounter (Signed)
Patient called to follow up on this refill. Patient has been scheduled for tomorrow.  [facility] ([facility] - [place], [place] Phone:  [phone]  Fax:  [phone]

## 2020-07-10 ENCOUNTER — Other Ambulatory Visit: Payer: Self-pay

## 2020-07-10 ENCOUNTER — Ambulatory Visit (INDEPENDENT_AMBULATORY_CARE_PROVIDER_SITE_OTHER): Payer: Medicare HMO | Admitting: Endocrinology

## 2020-07-10 ENCOUNTER — Encounter: Payer: Self-pay | Admitting: Endocrinology

## 2020-07-10 VITALS — BP 118/60 | HR 58 | Ht 67.0 in | Wt 235.4 lb

## 2020-07-10 DIAGNOSIS — E032 Hypothyroidism due to medicaments and other exogenous substances: Secondary | ICD-10-CM | POA: Diagnosis not present

## 2020-07-10 LAB — T4, FREE: Free T4: 2 ng/dL — ABNORMAL HIGH (ref 0.60–1.60)

## 2020-07-10 LAB — TSH: TSH: 1.87 u[IU]/mL (ref 0.35–4.50)

## 2020-07-10 NOTE — Progress Notes (Signed)
Subjective:    Patient ID: [name] [name], female    DOB: [birth year], 75 y.o.   MRN: [id number]  HPI Pt returns for f/u of hypothyroidism (she has been on amiodarone since 2018; hypothyroidism was dx'ed in late 2019, when she was rx'ed synthroid; she has never had thyroid imaging).  She takes synthroid as rx'ed.  pt states she feels well in general, except for fatigue.    Past Medical History:  Diagnosis Date   Anxiety    Arthritis    Cataracts, bilateral    immature   CKD (chronic kidney disease) stage 3, GFR 30-59 ml/min (HCC)    Depression    GERD (gastroesophageal reflux disease)    was on Nexium    GERD (gastroesophageal reflux disease)    Hemorrhoid    History of bronchitis    couple of yrs ago   History of colon polyps    History of peristent atrial fibrillation    Hyperlipidemia    takes Pravastatin daily   Hypertension    takes Amlodipine and Diovan daily   Hypertension    Pre-diabetes    Sleep apnea    no cpap used   Sleep apnea    study done 20+yrs ago and doesn't use cpap   Urinary urgency    Weakness    in left leg r/t back    Past Surgical History:  Procedure Laterality Date   ABDOMINAL HYSTERECTOMY  1980   partial   ABDOMINAL HYSTERECTOMY     partial   BACK SURGERY     CARDIOVERSION N/A 02/24/2016   Procedure: CARDIOVERSION;  Surgeon: Fay Records, MD;  Location: [facility];  Service: Cardiovascular;  Laterality: N/A;   CARDIOVERSION N/A 03/19/2016   Procedure: CARDIOVERSION;  Surgeon: Fay Records, MD;  Location: [facility];  Service: Cardiovascular;  Laterality: N/A;   [place]     COLONOSCOPY     COLONOSCOPY WITH PROPOFOL  12/29/2011   Procedure: COLONOSCOPY WITH PROPOFOL;  Surgeon: Lear Ng, MD;  Location: [facility];  Service: Endoscopy;  Laterality: N/A;   COLONOSCOPY WITH PROPOFOL N/A 05/24/2014   Procedure: COLONOSCOPY WITH PROPOFOL;  Surgeon: Wilford Corner, MD;   Location: [facility];  Service: Endoscopy;  Laterality: N/A;   ESOPHAGEAL MANOMETRY N/A 02/22/2017   Procedure: ESOPHAGEAL MANOMETRY (EM);  Surgeon: Wilford Corner, MD;  Location: [facility];  Service: Endoscopy;  Laterality: N/A;   ESOPHAGOGASTRODUODENOSCOPY     FOOT SURGERY     left, right   FRACTURE SURGERY     left leg-knee   HEEL SPUR EXCISION Bilateral    HOT HEMOSTASIS  12/29/2011   Procedure: HOT HEMOSTASIS (ARGON PLASMA COAGULATION/BICAP);  Surgeon: Lear Ng, MD;  Location: [facility];  Service: Endoscopy;  Laterality: N/A;   HOT HEMOSTASIS N/A 05/24/2014   Procedure: HOT HEMOSTASIS (ARGON PLASMA COAGULATION/BICAP);  Surgeon: Wilford Corner, MD;  Location: [facility];  Service: Endoscopy;  Laterality: N/A;   leg surgery d/t break Left    LUMBAR LAMINECTOMY/DECOMPRESSION MICRODISCECTOMY Left 04/05/2013   Procedure: LUMBAR FOUR TO FIVE LUMBAR LAMINECTOMY/DECOMPRESSION MICRODISCECTOMY 1 LEVEL;  Surgeon: Eustace Moore, MD;  Location: [facility];  Service: Neurosurgery;  Laterality: Left;   NISSEN FUNDOPLICATION     [place] IMPEDANCE STUDY N/A 02/22/2017   Procedure: [place] IMPEDANCE STUDY;  Surgeon: Wilford Corner, MD;  Location: [facility];  Service: Endoscopy;  Laterality: N/A;   TEE WITHOUT CARDIOVERSION N/A 02/24/2016  Procedure: TRANSESOPHAGEAL ECHOCARDIOGRAM (TEE);  Surgeon: Fay Records, MD;  Location: [facility];  Service: Cardiovascular;  Laterality: N/A;   TOTAL KNEE ARTHROPLASTY Left 10/07/2016   TOTAL KNEE ARTHROPLASTY Left 10/07/2016   Procedure: TOTAL KNEE ARTHROPLASTY;  Surgeon: [name], MD;  Location: [facility];  Service: Orthopedics;  Laterality: Left;    Social History   Socioeconomic History   Marital status: Married    Spouse name: Dominica Severin   Number of children: 2   Years of education: 12   Highest education level: Some college, no degree  Occupational History   Occupation: retired  Tobacco Use   Smoking status: Never   Smokeless  tobacco: Never  Vaping Use   Vaping Use: Never used  Substance and Sexual Activity   Alcohol use: No   Drug use: No   Sexual activity: Yes    Birth control/protection: Surgical  Other Topics Concern   Not on file  Social History Narrative   Right handed   Drinks caffeine   Two story home   Social Determinants of Health   Financial Resource Strain: Not on file  Food Insecurity: Not on file  Transportation Needs: Not on file  Physical Activity: Not on file  Stress: Not on file  Social Connections: Not on file  Intimate Partner Violence: Not on file    Current Outpatient Medications on File Prior to Visit  Medication Sig Dispense Refill   albuterol (VENTOLIN HFA) 108 (90 Base) MCG/ACT inhaler Inhale 1-2 puffs into the lungs every 4 (four) hours as needed for wheezing or shortness of breath. 1 g 1   amiodarone (PACERONE) 200 MG tablet Take 0.5 tablets (100 mg total) by mouth daily. 45 tablet 2   amLODipine (NORVASC) 5 MG tablet Take 1 tablet (5 mg total) by mouth daily. 90 tablet 3   Ascorbic Acid (VITAMIN C WITH ROSE HIPS) 1000 MG tablet Take 1,000 mg by mouth daily.     benzonatate (TESSALON) 200 MG capsule Take 1 capsule by mouth three times daily as needed for cough 60 capsule 0   buPROPion (WELLBUTRIN XL) 300 MG 24 hr tablet Take 1 tablet (300 mg total) by mouth daily. 90 tablet 0   CALCIUM PO Take 1 tablet by mouth every morning.     chlorpheniramine (CHLOR-TRIMETON) 4 MG tablet Take 4 mg by mouth at bedtime.     Cholecalciferol (VITAMIN D) 2000 UNITS tablet Take 4,000 Units by mouth daily.     [facility] MELATONIN PO Take 1 tablet by mouth at bedtime as needed.     diazepam (VALIUM) 5 MG tablet Take 1 tablet 30 to 40 minutes prior to MRI 1 tablet 0   doxycycline (VIBRA-TABS) 100 MG tablet Take 1 tablet (100 mg total) by mouth 2 (two) times daily. 14 tablet 0   ELIQUIS 5 MG TABS tablet TAKE 1 TABLET TWICE DAILY 180 tablet 1   fexofenadine (ALLEGRA) 180 MG tablet Take 180 mg by  mouth daily as needed for allergies or rhinitis.     furosemide (LASIX) 20 MG tablet TAKE 1 TABLET EVERY DAY 90 tablet 3   levothyroxine (SYNTHROID) 75 MCG tablet TAKE 1 TABLET (75 MCG TOTAL) BY MOUTH DAILY BEFORE BREAKFAST. 90 tablet 0   metoprolol succinate (TOPROL-XL) 25 MG 24 hr tablet TAKE 1/2 TABLET ONE TIME DAILY 45 tablet 3   potassium chloride SA (KLOR-CON) 20 MEQ tablet Take 1 tablet (20 mEq total) by mouth daily. 90 tablet 2   pravastatin (PRAVACHOL) 40 MG  tablet Take 40 mg by mouth at bedtime.     predniSONE (DELTASONE) 20 MG tablet Take 1 tablet (20 mg total) by mouth daily with breakfast. 5 tablet 0   Spacer/Aero-Holding Chambers (AEROCHAMBER MV) inhaler Use as instructed 1 each 0   No current facility-administered medications on file prior to visit.    Allergies  Allergen Reactions   Omnicef [Cefdinir] Swelling    Tongue swelling   Lexapro [Escitalopram]     Makes her feel funny   Penicillins     Pt cannot remember reaction she had as hadn't been on it in about 20 years but stated that she knows she is allergic to it.   Peroxide [Hydrogen Peroxide] Other (See Comments)    Redness.    Zoloft [Sertraline Hcl]     Makes her feel funny   Neosporin [Neomycin-Bacitracin Zn-Polymyx] Rash    Blisters, itching.     Family History  Problem Relation Age of Onset   Colon cancer Mother    Heart disease Father    Breast cancer Neg Hx    Thyroid disease Neg Hx     BP 118/60   Pulse (!) 58   Ht 5\' 7"  (1.702 m)   Wt 235 lb 6.4 oz (106.8 kg)   SpO2 97%   BMI 36.87 kg/m    Review of Systems     Objective:   Physical Exam NECK: There is no palpable thyroid enlargement.  No thyroid nodule is palpable.  No palpable lymphadenopathy at the anterior neck.   Lab Results  Component Value Date   TSH 1.87 07/10/2020       Assessment & Plan:  Hypothyroidism: well-controlled.  Please continue the same synthroid

## 2020-07-10 NOTE — Patient Instructions (Signed)
Blood tests are requested for you today.  We'll let you know about the results.  It is best to never miss the medication.  However, if you do miss it, next best is to double up the next time. There is no need to stop the amiodarone.  We'll work around that, as you need it for your heart rhythm. Please come back for a follow-up appointment in 6 months.

## 2020-07-23 ENCOUNTER — Other Ambulatory Visit: Payer: Self-pay | Admitting: Endocrinology

## 2020-07-23 DIAGNOSIS — E032 Hypothyroidism due to medicaments and other exogenous substances: Secondary | ICD-10-CM

## 2020-07-29 ENCOUNTER — Telehealth: Payer: Self-pay | Admitting: *Deleted

## 2020-07-29 DIAGNOSIS — R0609 Other forms of dyspnea: Secondary | ICD-10-CM

## 2020-07-29 DIAGNOSIS — Z79899 Other long term (current) drug therapy: Secondary | ICD-10-CM

## 2020-07-29 DIAGNOSIS — I10 Essential (primary) hypertension: Secondary | ICD-10-CM

## 2020-07-29 DIAGNOSIS — F419 Anxiety disorder, unspecified: Secondary | ICD-10-CM | POA: Diagnosis not present

## 2020-07-29 DIAGNOSIS — F331 Major depressive disorder, recurrent, moderate: Secondary | ICD-10-CM | POA: Diagnosis not present

## 2020-07-29 DIAGNOSIS — R0600 Dyspnea, unspecified: Secondary | ICD-10-CM

## 2020-07-29 DIAGNOSIS — I4819 Other persistent atrial fibrillation: Secondary | ICD-10-CM

## 2020-07-29 NOTE — Telephone Encounter (Signed)
Good Afternoon,   Please see the information below from Dr Marlou Porch:   Sorry to hear about decline in health.  Let's check a few things:  -Order a CBC, TSH, Free T4, CMET  -Check a chest XRAY PA and LAT  -Check ECHOCARDIOGRAM - dx dyspnea   Also, let's stop the amiodarone 100mg  PO QD.   Candee Furbish, MD   He would like for her to come in for some blood work, to have a chest x-ray and also an ultrasound of her heart.  I will be happy to have her scheduled for the above testing. He would also like for her Amiodarone be stopped.   I see she has an appointment with Dr Annamaria Boots on Friday.  She should be able to have the chest xray then.  I do not know if they will draw blood work for [place] and she may have to come to the [facility] office to have that done.  The echocardiogram I will need to order and someone will contact her to schedule.     Please let me know if she is agreeable to these orders.     Thank you,   Jeannene Patella, RN for Dr Marlou Porch

## 2020-07-29 NOTE — Telephone Encounter (Signed)
I will see her as planned, since that is in just a few days.

## 2020-07-29 NOTE — Telephone Encounter (Signed)
Dr. Annamaria Boots please advise on the following My Chart message:   I am Analyn's daughter, [name] [name].  I am having her send this email to you because her health has had a significant decline in the last 3-4 weeks.  I am sending this same email to her primary MD and her cardiologist.  She requested her primary change her medication for her depression and anxiety because she was feeling more anxious and crying all the time and didn't feel it was working anymore. The dosage on the fluoxetine was changed to 40 mg and since changing that she nor I have noticed a difference and if anything she is more anxious, shaky and depressed.  She has also started sleeping more during the day, is fatigued all the time, is visibly short of breath walking across a room and is unsteady on her feet.  Previously she was independent cleaning her house and doing what she wanted and now she can't even tidy up a single room without having to rest.  I am not sure what is going on, but she and I need your help in trying to get to the root of these issues.  She also needs something different to address her anxiety and mental health because what has been prescribed is not working.  Please consult with her other physicians and let us know what can be done. I will be attending her upcoming pulmonary MD visit with her on Friday 7/22.  Thank you for your time and attention to this matter.      Thank you

## 2020-07-30 ENCOUNTER — Other Ambulatory Visit (HOSPITAL_COMMUNITY)
Admission: RE | Admit: 2020-07-30 | Discharge: 2020-07-30 | Disposition: A | Discharge status: Home / Self Care | Payer: Medicare HMO | Source: Ambulatory Visit | Attending: Adult Health | Admitting: Adult Health

## 2020-07-30 DIAGNOSIS — Z01812 Encounter for preprocedural laboratory examination: Secondary | ICD-10-CM | POA: Diagnosis not present

## 2020-07-30 DIAGNOSIS — Z20822 Contact with and (suspected) exposure to covid-19: Secondary | ICD-10-CM | POA: Diagnosis not present

## 2020-07-30 LAB — SARS CORONAVIRUS 2 (TAT 6-24 HRS): SARS Coronavirus 2: NEGATIVE

## 2020-07-30 NOTE — Telephone Encounter (Signed)
Called and spoke with pt who reports neither herself or her daughter have reviewed the MyChart message sent to her regarding her "decline in health" message sent by daughter yesterday.  Advised per Dr Marlou Porch to d/c amiodarone, have lab work, echo and CXR as ordered.  She will come to this office tomorrow for lab work and then report to [facility] for the chest x-ray.  She is aware she will be contacted to be scheduled for the echocardiogram.  She is aware we will f/u as results come in.  She had no further questions at the end of the call.

## 2020-07-31 ENCOUNTER — Other Ambulatory Visit: Payer: Self-pay

## 2020-07-31 ENCOUNTER — Ambulatory Visit
Admission: RE | Admit: 2020-07-31 | Discharge: 2020-07-31 | Disposition: A | Discharge status: Home / Self Care | Payer: Medicare HMO | Source: Ambulatory Visit | Attending: Cardiology | Admitting: Cardiology

## 2020-07-31 ENCOUNTER — Other Ambulatory Visit: Payer: Medicare HMO

## 2020-07-31 DIAGNOSIS — Z79899 Other long term (current) drug therapy: Secondary | ICD-10-CM

## 2020-07-31 DIAGNOSIS — I4819 Other persistent atrial fibrillation: Secondary | ICD-10-CM

## 2020-07-31 DIAGNOSIS — I10 Essential (primary) hypertension: Secondary | ICD-10-CM

## 2020-07-31 DIAGNOSIS — R0609 Other forms of dyspnea: Secondary | ICD-10-CM

## 2020-07-31 DIAGNOSIS — R0600 Dyspnea, unspecified: Secondary | ICD-10-CM | POA: Diagnosis not present

## 2020-07-31 DIAGNOSIS — R0602 Shortness of breath: Secondary | ICD-10-CM | POA: Diagnosis not present

## 2020-07-31 LAB — COMPREHENSIVE METABOLIC PANEL
ALT: 5 IU/L (ref 0–32)
AST: 12 IU/L (ref 0–40)
Albumin/Globulin Ratio: 1.7 (ref 1.2–2.2)
Albumin: 3.8 g/dL (ref 3.7–4.7)
Alkaline Phosphatase: 71 IU/L (ref 44–121)
BUN/Creatinine Ratio: 9 — ABNORMAL LOW (ref 12–28)
BUN: 15 mg/dL (ref 8–27)
Bilirubin Total: 0.9 mg/dL (ref 0.0–1.2)
CO2: 24 mmol/L (ref 20–29)
Calcium: 9.1 mg/dL (ref 8.7–10.3)
Chloride: 101 mmol/L (ref 96–106)
Creatinine, Ser: 1.58 mg/dL — ABNORMAL HIGH (ref 0.57–1.00)
Globulin, Total: 2.3 g/dL (ref 1.5–4.5)
Glucose: 116 mg/dL — ABNORMAL HIGH (ref 65–99)
Potassium: 4.8 mmol/L (ref 3.5–5.2)
Sodium: 137 mmol/L (ref 134–144)
Total Protein: 6.1 g/dL (ref 6.0–8.5)
eGFR: 34 mL/min/{1.73_m2} — ABNORMAL LOW (ref >59)

## 2020-07-31 LAB — CBC
Hematocrit: 31.3 % — ABNORMAL LOW (ref 34.0–46.6)
Hemoglobin: 10.7 g/dL — ABNORMAL LOW (ref 11.1–15.9)
MCH: 31.8 pg (ref 26.6–33.0)
MCHC: 34.2 g/dL (ref 31.5–35.7)
MCV: 93 fL (ref 79–97)
Platelets: 104 10*3/uL — ABNORMAL LOW (ref 150–450)
RBC: 3.37 x10E6/uL — ABNORMAL LOW (ref 3.77–5.28)
RDW: 13.2 % (ref 11.7–15.4)
WBC: 4.6 10*3/uL (ref 3.4–10.8)

## 2020-07-31 LAB — T4, FREE: Free T4: 1.7 ng/dL (ref 0.82–1.77)

## 2020-07-31 LAB — TSH: TSH: 3.67 u[IU]/mL (ref 0.450–4.500)

## 2020-08-01 NOTE — Progress Notes (Signed)
HPI F never smoker followed for OSA, complicated by  AFib/ amiodarone, HBP, Allergic Rhinitis,   GERD, Hypothyroid, Upper Airway Cough,  HST 09/24/2018- AHI 30.5/ hr, desaturation to 76%, body weight 249.6 lbs PFT 01/19/2018- Mild Diffusion defect PFT 08/02/20- mild obstruction, no resp to BD, mild reduction DLCO Walk Test  room air 08/02/20-  Resting 92%/ HR 71, 1st lap 91%/ HR 84 (stopped twice to rest), second lap 93%/ HR 88 (stopped once to rest). Limited by weakness, not by oxygenation. ----------------------------------------------------------------------------------------------   01/18/19- Virtual Visit via Telephone Note  I connected with Fatima Sanger on 01/18/19 at  9:00 AM EST by telephone and verified that I am speaking with the correct person using two identifiers.  Location: Patient: H Provider: O   I discussed the limitations, risks, security and privacy concerns of performing an evaluation and management service by telephone and the availability of in person appointments. I also discussed with the patient that there may be a patient responsible charge related to this service. The patient expressed understanding and agreed to proceed.   History of Present Illness: 68 yoF followed for OSA, complicated by  AFib/ amiodarone, HBP, Allergic Rhinitis,   GERD, Hypothyroid, Upper Airway Cough,  CPAP auto 5-15/ [facility] Download compliance 83%, AHI 1.9/ hr At LOV we gave spacer for her ventolin rescue inhaler used for cough. She reports cough "90% improved" and not using either rescue or Bevespi. Hears some wheeze. Has f/u with TP next week for this issue. Gets anxious with CPAP. Says problem is the full-face mask, which also aggravates her Rosacea. Discussed download indicating satisfactory compliance and control.  Observations/Objective: Download compliance 83%, AHI 1.9/ hr  Assessment and Plan: OSA-  Benefits. Needs mask fitting for options.  Cough- improved, but has  wheezing and not using inhalers. To keep planned appt.  Follow Up Instructions: 4 months   I discussed the assessment and treatment plan with the patient. The patient was provided an opportunity to ask questions and all were answered. The patient agreed with the plan and demonstrated an understanding of the instructions.   The patient was advised to call back or seek an in-person evaluation if the symptoms worsen or if the condition fails to improve as anticipated.  I provided 21 minutes of non-face-to-face time during this encounter. Baird Lyons, MD   08/02/20- 75 yoF followed for OSA, complicated by  AFib/ amiodarone, HBP, Allergic Rhinitis,  GERD, Hypothyroid, Upper Airway Cough,  CPAP auto 5-15/ [facility] failed Download- Not using. Body weight today-230 lbs                                      Dtr here today Covid vax- -----Over the last 5-6 weeks she has been not feeling well.  No energy, gives out of breath when walking across the room.  She stated that she feels ok when she is sitting, but with activity she gives out of breath.  Daughter stated that about 1.5 to 2 months ago she could clean her own house and now she has to sit down when tidying up one area in a room.  Not using CPAP. Second trial. Says she can't sleep with it- sleeps better without.  Arrival O2 sat today 96% Walk Test  room air 08/02/20-  Resting 92%/ HR 71, 1st lap 91%/ HR 84 (stopped twice to rest), second lap 93%/ HR 88 (stopped  once to rest). Limited by weakness, not by oxygenation. Note- hemoglobin 13 .19 Oct 2019, 10.7 2 days ago. No recognized bleeding. PFT 08/02/20- mild obstruction, no resp to BD, mild reduction DLCO She denies cough, wheeze, edema, chest pain. CXR 07/31/20-  IMPRESSION: 1.  Stable cardiomegaly.  No pulmonary venous congestion. 2.  No acute pulmonary disease.  Chest is stable from prior exam  ROS-see HPI   + = positive Constitutional:    weight loss, night sweats, fevers,  chills, +fatigue, lassitude. HEENT:    headaches, difficulty swallowing, tooth/dental problems, sore throat,       sneezing, itching, ear ache, nasal congestion, post nasal drip, snoring CV:    chest pain, orthopnea, PND, swelling in lower extremities, anasarca,                                   dizziness, palpitations Resp:   +shortness of breath with exertion or at rest.                productive cough,   +non-productive cough, coughing up of blood.              change in color of mucus.  +wheezing.   Skin:    rash or lesions. GI:  No-   heartburn, indigestion, abdominal pain, nausea, vomiting, diarrhea,                 change in bowel habits, loss of appetite GU: dysuria, change in color of urine, no urgency or frequency.   flank pain. MS:   joint pain, stiffness, decreased range of motion, back pain. Neuro-     nothing unusual Psych:  change in mood or affect.  depression or anxiety.   memory loss.  OBJ- Physical Exam General- Alert, Oriented, Affect-appropriate, Distress- none acute, + obese Skin- rash-none, lesions- none, excoriation- none Lymphadenopathy- none Head- atraumatic            Eyes- Gross vision intact, PERRLA, conjunctivae and secretions clear            Ears- Hearing, canals-normal            Nose- Clear, no-Septal dev, mucus, polyps, erosion, perforation             Throat- Mallampati III , mucosa clear , drainage- none, tonsils- atrophic Neck- flexible , trachea midline, no stridor , thyroid nl, carotid no bruit Chest - symmetrical excursion , unlabored           Heart/CV- RRR , no murmur , no gallop  , no rub, nl s1 s2                           - JVD- none , edema- none, stasis changes- none, varices- none           Lung- + clear, wheeze- none, cough- none , dullness-none, rub- none           Chest wall-  Abd-  Br/ Gen/ Rectal- Not done, not indicated Extrem- cyanosis- none, clubbing, none, atrophy- none, strength- nl Neuro- grossly intact to observation

## 2020-08-02 ENCOUNTER — Ambulatory Visit (INDEPENDENT_AMBULATORY_CARE_PROVIDER_SITE_OTHER): Payer: Medicare HMO | Admitting: Internal Medicine

## 2020-08-02 ENCOUNTER — Other Ambulatory Visit: Payer: Self-pay

## 2020-08-02 ENCOUNTER — Ambulatory Visit: Payer: Medicare HMO | Admitting: Internal Medicine

## 2020-08-02 ENCOUNTER — Encounter: Payer: Self-pay | Admitting: Internal Medicine

## 2020-08-02 ENCOUNTER — Telehealth: Payer: Self-pay | Admitting: Internal Medicine

## 2020-08-02 ENCOUNTER — Telehealth: Payer: Self-pay | Admitting: *Deleted

## 2020-08-02 ENCOUNTER — Other Ambulatory Visit: Payer: Self-pay | Admitting: *Deleted

## 2020-08-02 DIAGNOSIS — R058 Other specified cough: Secondary | ICD-10-CM | POA: Diagnosis not present

## 2020-08-02 DIAGNOSIS — R0600 Dyspnea, unspecified: Secondary | ICD-10-CM

## 2020-08-02 DIAGNOSIS — R0609 Other forms of dyspnea: Secondary | ICD-10-CM

## 2020-08-02 DIAGNOSIS — I4819 Other persistent atrial fibrillation: Secondary | ICD-10-CM | POA: Diagnosis not present

## 2020-08-02 DIAGNOSIS — G4733 Obstructive sleep apnea (adult) (pediatric): Secondary | ICD-10-CM | POA: Diagnosis not present

## 2020-08-02 DIAGNOSIS — D649 Anemia, unspecified: Secondary | ICD-10-CM

## 2020-08-02 HISTORY — DX: Anemia, unspecified: D64.9

## 2020-08-02 LAB — PULMONARY FUNCTION TEST
DL/VA % pred: 86 %
DL/VA: 3.46 ml/min/mmHg/L
DLCO cor % pred: 68 %
DLCO cor: 14.61 ml/min/mmHg
DLCO unc % pred: 61 %
DLCO unc: 13.24 ml/min/mmHg
FEF 25-75 Post: 2.21 L/sec
FEF 25-75 Pre: 1.72 L/sec
FEF2575-%Change-Post: 28 %
FEF2575-%Pred-Post: 116 %
FEF2575-%Pred-Pre: 90 %
FEV1-%Change-Post: 7 %
FEV1-%Pred-Post: 79 %
FEV1-%Pred-Pre: 74 %
FEV1-Post: 1.96 L
FEV1-Pre: 1.82 L
FEV1FVC-%Change-Post: 2 %
FEV1FVC-%Pred-Pre: 106 %
FEV6-%Change-Post: 5 %
FEV6-%Pred-Post: 76 %
FEV6-%Pred-Pre: 73 %
FEV6-Post: 2.39 L
FEV6-Pre: 2.28 L
FEV6FVC-%Pred-Post: 104 %
FEV6FVC-%Pred-Pre: 104 %
FVC-%Change-Post: 4 %
FVC-%Pred-Post: 73 %
FVC-%Pred-Pre: 69 %
FVC-Post: 2.39 L
FVC-Pre: 2.28 L
Post FEV1/FVC ratio: 82 %
Post FEV6/FVC ratio: 100 %
Pre FEV1/FVC ratio: 80 %
Pre FEV6/FVC Ratio: 100 %
RV % pred: 108 %
RV: 2.64 L
TLC % pred: 94 %
TLC: 5.25 L

## 2020-08-02 MED ORDER — SPIRIVA RESPIMAT 2.5 MCG/ACT IN AERS: 2.0000 | INHALATION_SPRAY | Freq: Every day | RESPIRATORY_TRACT | 0 refills | Status: DC

## 2020-08-02 NOTE — Telephone Encounter (Signed)
error 

## 2020-08-02 NOTE — Addendum Note (Signed)
Addended by: Vanessa Barbara on: 08/02/2020 03:38 PM   Modules accepted: Orders

## 2020-08-02 NOTE — Assessment & Plan Note (Signed)
Failed second time around trying CPAP in spite of comfort efforts. Not a realistic candidate for alternatives.  Plan- overnight oximetry, consider possible oral appliance

## 2020-08-02 NOTE — Telephone Encounter (Signed)
CM sent to [facility].   

## 2020-08-02 NOTE — Assessment & Plan Note (Addendum)
PFT doesn't indicate enough lung disease to explain her increased dyspnea on exertion. Her obesity, deconditioning, Anemia and cardiac factors all contribute. Doubt her noncompliance with CPAP is doing this, although it adds to any daytime fatigue.  Plan- encourage walking in [facility]. Try Spiriva (avoid cardiac stimulation), check ONOX.

## 2020-08-02 NOTE — Telephone Encounter (Signed)
Order updated to reflect to be done on RA.

## 2020-08-02 NOTE — Patient Instructions (Signed)
Full PFT performed today. °

## 2020-08-02 NOTE — Telephone Encounter (Signed)
Please see msg below regarding ONO order.  Catha Nottingham, Jannifer Hick, Remus Blake, Gaylene Brooks I'll need this order to specify if this ONO is on room air etc..Marland Kitchen

## 2020-08-02 NOTE — Patient Instructions (Addendum)
Order- sample Spiriva 2.5    Inhale 2 puffs,  once daily.  Order- overnight oximetry on room air   dx dyspnea on exertion  If this seems to help your breathing, let me know and we can send a prescription  Please talk with Dr Dema Severin about your anemia- your hemoglobin was 13.7 in October and is 10.7 now.  Follow up with your cardiologist as planned.  Try to walk as much as you can- look for a mall or such where you can walk in air-conditioning and sit when you need to.\  Please call if we can help

## 2020-08-02 NOTE — Assessment & Plan Note (Signed)
I asked her to discuss this with her PCP. Suspect occult blood loss.

## 2020-08-02 NOTE — Progress Notes (Signed)
Full PFT performed today. °

## 2020-08-02 NOTE — Telephone Encounter (Signed)
Her blood count is down, Hgb 10.7 from close to 14. Platelets are lower as well 104 from 200 Normal thyroid Normal potassium, liver Mildly decreased kidney function when compared to 2 years ago (Creat now 1.28fom 1.33).    Recommend GI (Dr. SMichail Sermonwith ESadie Haberhas done colonoscopy in the past) andhematology referral (anemia with low platelets)   MCandee Furbish MD   Referral placed for hematology.  Pt states she will contact  GI herself.

## 2020-08-02 NOTE — Assessment & Plan Note (Signed)
Exam c/w NSR at this visit, with extras noted on pulse-ox during walk test after albuterol for PFT.

## 2020-08-05 DIAGNOSIS — K120 Recurrent oral aphthae: Secondary | ICD-10-CM | POA: Diagnosis not present

## 2020-08-09 ENCOUNTER — Other Ambulatory Visit: Payer: Self-pay

## 2020-08-09 ENCOUNTER — Ambulatory Visit (HOSPITAL_COMMUNITY): Payer: Medicare HMO | Attending: Cardiology

## 2020-08-09 DIAGNOSIS — I4819 Other persistent atrial fibrillation: Secondary | ICD-10-CM | POA: Diagnosis not present

## 2020-08-09 DIAGNOSIS — R0609 Other forms of dyspnea: Secondary | ICD-10-CM

## 2020-08-09 DIAGNOSIS — R0600 Dyspnea, unspecified: Secondary | ICD-10-CM | POA: Diagnosis not present

## 2020-08-09 LAB — ECHOCARDIOGRAM COMPLETE
Area-P 1/2: 3.3 cm2
S' Lateral: 3.4 cm

## 2020-08-12 ENCOUNTER — Other Ambulatory Visit: Payer: Self-pay | Admitting: *Deleted

## 2020-08-12 DIAGNOSIS — D649 Anemia, unspecified: Secondary | ICD-10-CM

## 2020-08-12 NOTE — Progress Notes (Signed)
Provider reviewed labs in chart and determined she needs to be seen sooner. Scheduled for 08/13/20. Notified of new appointment by scheduler.

## 2020-08-13 ENCOUNTER — Other Ambulatory Visit: Payer: Self-pay

## 2020-08-13 ENCOUNTER — Inpatient Hospital Stay: Payer: Medicare HMO | Attending: Oncology | Admitting: Nurse Practitioner

## 2020-08-13 ENCOUNTER — Inpatient Hospital Stay: Payer: Medicare HMO

## 2020-08-13 ENCOUNTER — Encounter: Payer: Self-pay | Admitting: Nurse Practitioner

## 2020-08-13 ENCOUNTER — Encounter: Payer: Self-pay | Admitting: Internal Medicine

## 2020-08-13 VITALS — BP 127/57 | HR 76 | Temp 97.7°F | Resp 18 | Ht 67.0 in | Wt 222.6 lb

## 2020-08-13 DIAGNOSIS — D649 Anemia, unspecified: Secondary | ICD-10-CM

## 2020-08-13 DIAGNOSIS — E785 Hyperlipidemia, unspecified: Secondary | ICD-10-CM | POA: Diagnosis not present

## 2020-08-13 DIAGNOSIS — I4891 Unspecified atrial fibrillation: Secondary | ICD-10-CM | POA: Diagnosis not present

## 2020-08-13 DIAGNOSIS — Z79899 Other long term (current) drug therapy: Secondary | ICD-10-CM | POA: Insufficient documentation

## 2020-08-13 DIAGNOSIS — D696 Thrombocytopenia, unspecified: Secondary | ICD-10-CM

## 2020-08-13 DIAGNOSIS — R5383 Other fatigue: Secondary | ICD-10-CM | POA: Diagnosis not present

## 2020-08-13 DIAGNOSIS — E039 Hypothyroidism, unspecified: Secondary | ICD-10-CM | POA: Diagnosis not present

## 2020-08-13 DIAGNOSIS — R0683 Snoring: Secondary | ICD-10-CM | POA: Diagnosis not present

## 2020-08-13 DIAGNOSIS — G4730 Sleep apnea, unspecified: Secondary | ICD-10-CM

## 2020-08-13 DIAGNOSIS — R0609 Other forms of dyspnea: Secondary | ICD-10-CM | POA: Diagnosis not present

## 2020-08-13 DIAGNOSIS — I10 Essential (primary) hypertension: Secondary | ICD-10-CM | POA: Insufficient documentation

## 2020-08-13 DIAGNOSIS — K219 Gastro-esophageal reflux disease without esophagitis: Secondary | ICD-10-CM | POA: Diagnosis not present

## 2020-08-13 DIAGNOSIS — N189 Chronic kidney disease, unspecified: Secondary | ICD-10-CM

## 2020-08-13 LAB — RETIC PANEL
Immature Retic Fract: 15.6 % (ref 2.3–15.9)
RBC.: 3.38 MIL/uL — ABNORMAL LOW (ref 3.87–5.11)
Retic Count, Absolute: 151.1 10*3/uL (ref 19.0–186.0)
Retic Ct Pct: 4.6 % — ABNORMAL HIGH (ref 0.4–3.1)
Reticulocyte Hemoglobin: 31.7 pg (ref >27.9)

## 2020-08-13 LAB — CBC WITH DIFFERENTIAL (CANCER CENTER ONLY)
Abs Immature Granulocytes: 0 10*3/uL (ref 0.00–0.07)
Band Neutrophils: 1 %
Basophils Absolute: 0 10*3/uL (ref 0.0–0.1)
Basophils Relative: 0 %
Eosinophils Absolute: 0.1 10*3/uL (ref 0.0–0.5)
Eosinophils Relative: 3 %
HCT: 34.5 % — ABNORMAL LOW (ref 36.0–46.0)
Hemoglobin: 10.6 g/dL — ABNORMAL LOW (ref 12.0–15.0)
Lymphocytes Relative: 43 %
Lymphs Abs: 1.9 10*3/uL (ref 0.7–4.0)
MCH: 31.5 pg (ref 26.0–34.0)
MCHC: 30.7 g/dL (ref 30.0–36.0)
MCV: 102.7 fL — ABNORMAL HIGH (ref 80.0–100.0)
Metamyelocytes Relative: 1 %
Monocytes Absolute: 0.6 10*3/uL (ref 0.1–1.0)
Monocytes Relative: 13 %
Neutro Abs: 1.8 10*3/uL (ref 1.7–7.7)
Neutrophils Relative %: 39 %
Platelet Count: 114 10*3/uL — ABNORMAL LOW (ref 150–400)
RBC: 3.36 MIL/uL — ABNORMAL LOW (ref 3.87–5.11)
RDW: 15.4 % (ref 11.5–15.5)
WBC Count: 4.4 10*3/uL (ref 4.0–10.5)
nRBC: 0 % (ref 0.0–0.2)

## 2020-08-13 LAB — VITAMIN B12: Vitamin B-12: 3598 pg/mL — ABNORMAL HIGH (ref 180–914)

## 2020-08-13 LAB — SAVE SMEAR(SSMR), FOR PROVIDER SLIDE REVIEW

## 2020-08-13 LAB — LACTATE DEHYDROGENASE: LDH: 612 U/L — ABNORMAL HIGH (ref 98–192)

## 2020-08-13 NOTE — Progress Notes (Signed)
New Hematology/Oncology Consult   Requesting MD: Dr. Candee Furbish [phone]  Reason for Consult: Anemia and thrombocytopenia  HPI: [name] [name] is a 75 year old woman referred for evaluation of anemia and thrombocytopenia.  Past medical history significant for chronic kidney disease, GERD, atrial fibrillation, hyperlipidemia, hypertension, sleep apnea.  CBC from 07/31/2020 returned with a hemoglobin of 10.7, white count 4.6 and platelet count 104,000.  This compares to a CBC from 11/02/2019 at which time the hemoglobin was 13.7, white count 5.8 and platelet count 200,000.  She reports fatigue and dyspnea on exertion over the past 4 to 6 weeks.  The dyspnea on exertion is better but does persist to some degree.  Amiodarone was discontinued recently.  No improvement in her symptoms.  No fevers or sweats.  Appetite is poor.  She estimates losing 20 pounds over the past 3 to 4 weeks.  Tongue intermittently feels sore.  She noticed some numbness in her hands about 3 weeks ago.  She attributes this to beginning BuSpar.  No numbness in the feet.  She reports onset of balance issues about 4 years ago.  She sees Dr. Tomi [name].  No unusual headaches or vision change.  She denies bleeding.  She has noticed recent bruising with lab draws.  No spontaneous bruising.  She has an occasional cough.  No nausea or vomiting.  No dysphagia.  No change in bowel habits.  Past Medical History:  Diagnosis Date   Anxiety    Arthritis    Cataracts, bilateral    immature   CKD (chronic kidney disease) stage 3, GFR 30-59 ml/min (HCC)    Depression    GERD (gastroesophageal reflux disease)    was on Nexium    GERD (gastroesophageal reflux disease)    Hemorrhoid    History of bronchitis    couple of yrs ago   History of colon polyps    History of peristent atrial fibrillation    Hyperlipidemia    takes Pravastatin daily   Hypertension    takes Amlodipine and Diovan daily   Hypertension    Pre-diabetes    Sleep apnea     no cpap used   Sleep apnea    study done 20+yrs ago and doesn't use cpap   Urinary urgency    Weakness    in left leg r/t back  :   Past Surgical History:  Procedure Laterality Date   ABDOMINAL HYSTERECTOMY  1980   partial   ABDOMINAL HYSTERECTOMY     partial   BACK SURGERY     CARDIOVERSION N/A 02/24/2016   Procedure: CARDIOVERSION;  Surgeon: Fay Records, MD;  Location: [place];  Service: Cardiovascular;  Laterality: N/A;   CARDIOVERSION N/A 03/19/2016   Procedure: CARDIOVERSION;  Surgeon: Fay Records, MD;  Location: [place];  Service: Cardiovascular;  Laterality: N/A;   [place]     COLONOSCOPY     COLONOSCOPY WITH PROPOFOL  12/29/2011   Procedure: COLONOSCOPY WITH PROPOFOL;  Surgeon: Lear Ng, MD;  Location: [facility];  Service: Endoscopy;  Laterality: N/A;   COLONOSCOPY WITH PROPOFOL N/A 05/24/2014   Procedure: COLONOSCOPY WITH PROPOFOL;  Surgeon: Wilford Corner, MD;  Location: [facility];  Service: Endoscopy;  Laterality: N/A;   ESOPHAGEAL MANOMETRY N/A 02/22/2017   Procedure: ESOPHAGEAL MANOMETRY (EM);  Surgeon: Wilford Corner, MD;  Location: [facility];  Service: Endoscopy;  Laterality: N/A;   ESOPHAGOGASTRODUODENOSCOPY     FOOT SURGERY  left, right   FRACTURE SURGERY     left leg-knee   HEEL SPUR EXCISION Bilateral    HOT HEMOSTASIS  12/29/2011   Procedure: HOT HEMOSTASIS (ARGON PLASMA COAGULATION/BICAP);  Surgeon: Lear Ng, MD;  Location: [facility];  Service: Endoscopy;  Laterality: N/A;   HOT HEMOSTASIS N/A 05/24/2014   Procedure: HOT HEMOSTASIS (ARGON PLASMA COAGULATION/BICAP);  Surgeon: Wilford Corner, MD;  Location: [facility];  Service: Endoscopy;  Laterality: N/A;   leg surgery d/t break Left    LUMBAR LAMINECTOMY/DECOMPRESSION MICRODISCECTOMY Left 04/05/2013   Procedure: LUMBAR FOUR TO FIVE LUMBAR LAMINECTOMY/DECOMPRESSION MICRODISCECTOMY 1 LEVEL;   Surgeon: Eustace Moore, MD;  Location: [place] [facility];  Service: Neurosurgery;  Laterality: Left;   NISSEN FUNDOPLICATION     [place] IMPEDANCE STUDY N/A 02/22/2017   Procedure: [place] IMPEDANCE STUDY;  Surgeon: Wilford Corner, MD;  Location: [facility];  Service: Endoscopy;  Laterality: N/A;   TEE WITHOUT CARDIOVERSION N/A 02/24/2016   Procedure: TRANSESOPHAGEAL ECHOCARDIOGRAM (TEE);  Surgeon: Fay Records, MD;  Location: [place];  Service: Cardiovascular;  Laterality: N/A;   TOTAL KNEE ARTHROPLASTY Left 10/07/2016   TOTAL KNEE ARTHROPLASTY Left 10/07/2016   Procedure: TOTAL KNEE ARTHROPLASTY;  Surgeon: [name], MD;  Location: [place];  Service: Orthopedics;  Laterality: Left;  :   Current Outpatient Medications:    albuterol (VENTOLIN HFA) 108 (90 Base) MCG/ACT inhaler, Inhale 1-2 puffs into the lungs every 4 (four) hours as needed for wheezing or shortness of breath., Disp: 1 g, Rfl: 1   amLODipine (NORVASC) 5 MG tablet, Take 1 tablet (5 mg total) by mouth daily., Disp: 90 tablet, Rfl: 3   Ascorbic Acid (VITAMIN C WITH ROSE HIPS) 1000 MG tablet, Take 1,000 mg by mouth daily., Disp: , Rfl:    benzonatate (TESSALON) 200 MG capsule, Take 1 capsule by mouth three times daily as needed for cough, Disp: 60 capsule, Rfl: 0   buPROPion (WELLBUTRIN SR) 200 MG 12 hr tablet, Take 400 mg by mouth daily., Disp: , Rfl:    busPIRone (BUSPAR) 10 MG tablet, Take 10 mg by mouth 3 (three) times daily., Disp: , Rfl:    CALCIUM PO, Take 1 tablet by mouth every morning., Disp: , Rfl:    Cholecalciferol (VITAMIN D) 2000 UNITS tablet, Take 4,000 Units by mouth daily., Disp: , Rfl:    [employer] MELATONIN PO, Take 1 tablet by mouth at bedtime as needed., Disp: , Rfl:    ELIQUIS 5 MG TABS tablet, TAKE 1 TABLET TWICE DAILY, Disp: 180 tablet, Rfl: 1   fexofenadine (ALLEGRA) 180 MG tablet, Take 180 mg by mouth daily as needed for allergies or rhinitis., Disp: , Rfl:    furosemide (LASIX) 20 MG tablet, TAKE 1 TABLET EVERY  DAY, Disp: 90 tablet, Rfl: 3   levothyroxine (SYNTHROID) 75 MCG tablet, TAKE 1 TABLET DAILY BEFORE BREAKFAST (NEED APPOINTMENT FOR FURTHER REFILLS), Disp: 90 tablet, Rfl: 0   metoprolol succinate (TOPROL-XL) 25 MG 24 hr tablet, TAKE 1/2 TABLET ONE TIME DAILY, Disp: 45 tablet, Rfl: 3   potassium chloride SA (KLOR-CON) 20 MEQ tablet, Take 1 tablet (20 mEq total) by mouth daily., Disp: 90 tablet, Rfl: 2   pravastatin (PRAVACHOL) 40 MG tablet, Take 40 mg by mouth at bedtime., Disp: , Rfl:    Spacer/Aero-Holding Chambers (AEROCHAMBER MV) inhaler, Use as instructed, Disp: 1 each, Rfl: 0   Tiotropium Bromide Monohydrate (SPIRIVA RESPIMAT) 2.5 MCG/ACT AERS, Inhale 2 puffs into the lungs daily., Disp: 2 each, Rfl:  0:  :   Allergies  Allergen Reactions   Omnicef [Cefdinir] Swelling    Tongue swelling   Lexapro [Escitalopram]     Makes her feel funny   Penicillins     Pt cannot remember reaction she had as hadn't been on it in about 20 years but stated that she knows she is allergic to it.   Peroxide [Hydrogen Peroxide] Other (See Comments)    Redness.    Zoloft [Sertraline Hcl]     Makes her feel funny   Neosporin [Neomycin-Bacitracin Zn-Polymyx] Rash    Blisters, itching.   :  FH: Mother deceased with colon cancer at age 72.  Father deceased with "heart trouble".  Sister died in a motor vehicle accident.  No family history of a blood disorder.  SOCIAL HISTORY: She lives in [place].  She is married.  She has 1 daughter, 1 son, both reported to be in good health.  She previously worked at a bank.  No tobacco or alcohol use.  Review of Systems: She reports fatigue and dyspnea on exertion over the past 4 to 6 weeks.  The dyspnea on exertion is better but does persist to some degree.  Amiodarone was discontinued recently.  No improvement in her symptoms.  No fevers or sweats.  Appetite is poor.  She estimates losing 20 pounds over the past 3 to 4 weeks.  Tongue intermittently feels sore.  She  noticed some numbness in her hands about 3 weeks ago.  She attributes this to beginning BuSpar.  No numbness in the feet.  She reports onset of balance issues about 4 years ago.  She sees Dr. Tomi [name].  No unusual headaches or vision change.  She denies bleeding.  She has noticed recent bruising with lab draws.  No spontaneous bruising.  She has an occasional cough.  No nausea or vomiting.  No dysphagia.  No change in bowel habits.   Physical Exam:  Blood pressure (!) 127/57, pulse 76, temperature 97.7 F (36.5 C), temperature source Oral, resp. rate 18, height 5' 7" (1.702 m), weight 222 lb 9.6 oz (101 kg), SpO2 98 %.  HEENT: No thrush or ulcers.  Sclera anicteric. Lungs: Lungs clear bilaterally. Cardiac: Regular rate and rhythm. Abdomen: Abdomen soft and nontender.  No hepatosplenomegaly. Vascular: No leg edema. Lymph nodes: No palpable cervical, supraclavicular, axillary or inguinal lymph nodes. Neurologic: Alert and oriented.  Follows commands. Skin: Brown discoloration below the knees bilaterally.  A few small ecchymoses scattered over the forearms.  LABS:   Recent Labs    08/13/20 1251  WBC 4.4  HGB 10.6*  HCT 34.5*  PLT 114*   Peripheral blood smear-#1 platelets-appear mildly decreased in number; #2 Red blood cells-no increase in polychromasia; moderate variation in red cell size; few ovalocytes; #3 white blood cells-mostly mature polys, lymphs  RADIOLOGY:  DG Chest 2 View  Result Date: 07/31/2020 CLINICAL DATA:  Shortness of breath. EXAM: CHEST - 2 VIEW COMPARISON:  06/21/2020. FINDINGS: Mediastinum and hilar structures normal. Stable cardiomegaly. No pulmonary venous congestion. No focal infiltrate. No pleural effusion or pneumothorax. No acute bony abnormality identified. IMPRESSION: 1.  Stable cardiomegaly.  No pulmonary venous congestion. 2.  No acute pulmonary disease.  Chest is stable from prior exam. Electronically Signed   By: Marcello Moores  Register   On: 07/31/2020 09:42    ECHOCARDIOGRAM COMPLETE  Result Date: 08/09/2020    ECHOCARDIOGRAM REPORT   Patient Name:   [name] [name] Date of Exam: 08/09/2020 Medical Rec #:  [id number]     Height:       67.0 in Accession #:    [id number]    Weight:       230.0 lb Date of Birth:  08/25/1945      BSA:          2.146 m Patient Age:    89 years      BP:           112/58 mmHg Patient Gender: F             HR:           68 bpm. Exam Location:  [place] Procedure: 2D Echo, Cardiac Doppler and Color Doppler Indications:    R06.00 SOB  History:        Patient has prior history of Echocardiogram examinations, most                 recent 11/11/2018. Arrythmias:Atrial Fibrillation,                 Signs/Symptoms:Shortness of Breath; Risk Factors:Hypertension,                 Dyslipidemia and Obesity.  Sonographer:    Coralyn Helling RDCS Referring Phys: [id number] MARK C SKAINS  Sonographer Comments: Patient is morbidly obese. Image acquisition challenging due to patient body habitus and Image acquisition challenging due to respiratory motion. IMPRESSIONS  1. Left ventricular ejection fraction, by estimation, is 60 to 65%. The left ventricle has normal function. The left ventricle has no regional wall motion abnormalities. Left ventricular diastolic parameters were normal.  2. Right ventricular systolic function is normal. The right ventricular size is normal.  3. Left atrial size was moderately dilated.  4. The mitral valve is degenerative. Mild mitral valve regurgitation. No evidence of mitral stenosis. Moderate mitral annular calcification.  5. The aortic valve is normal in structure. Aortic valve regurgitation is not visualized. No aortic stenosis is present.  6. The inferior vena cava is normal in size with greater than 50% respiratory variability, suggesting right atrial pressure of 3 mmHg. FINDINGS  Left Ventricle: Left ventricular ejection fraction, by estimation, is 60 to 65%. The left ventricle has normal function. The left ventricle has no  regional wall motion abnormalities. The left ventricular internal cavity size was normal in size. There is  no left ventricular hypertrophy. Left ventricular diastolic parameters were normal. Right Ventricle: The right ventricular size is normal. No increase in right ventricular wall thickness. Right ventricular systolic function is normal. Left Atrium: Left atrial size was moderately dilated. Right Atrium: Right atrial size was normal in size. Pericardium: There is no evidence of pericardial effusion. Mitral Valve: The mitral valve is degenerative in appearance. There is moderate thickening of the mitral valve leaflet(s). There is moderate calcification of the mitral valve leaflet(s). Moderate mitral annular calcification. Mild mitral valve regurgitation. No evidence of mitral valve stenosis. Tricuspid Valve: The tricuspid valve is normal in structure. Tricuspid valve regurgitation is mild . No evidence of tricuspid stenosis. Aortic Valve: The aortic valve is normal in structure. Aortic valve regurgitation is not visualized. No aortic stenosis is present. Pulmonic Valve: The pulmonic valve was normal in structure. Pulmonic valve regurgitation is not visualized. No evidence of pulmonic stenosis. Aorta: The aortic root is normal in size and structure. Venous: The inferior vena cava is normal in size with greater than 50% respiratory variability, suggesting right atrial pressure of 3 mmHg. IAS/Shunts: No atrial level shunt detected by color flow  Doppler.  LEFT VENTRICLE PLAX 2D LVIDd:         4.80 cm  Diastology LVIDs:         3.40 cm  LV e' medial:    8.81 cm/s LV PW:         1.20 cm  LV E/e' medial:  12.8 LV IVS:        1.00 cm  LV e' lateral:   10.95 cm/s LVOT diam:     1.80 cm  LV E/e' lateral: 10.3 LV SV:         52 LV SV Index:   24 LVOT Area:     2.54 cm  RIGHT VENTRICLE            IVC RVSP:           42.7 mmHg  IVC diam: 1.20 cm LEFT ATRIUM             Index       RIGHT ATRIUM           Index LA diam:         4.40 cm 2.05 cm/m  RA Pressure: 3.00 mmHg LA Vol (A2C):   73.5 ml 34.25 ml/m RA Area:     13.30 cm LA Vol (A4C):   53.7 ml 25.02 ml/m RA Volume:   30.80 ml  14.35 ml/m LA Biplane Vol: 64.7 ml 30.15 ml/m  AORTIC VALVE LVOT Vmax:   91.72 cm/s LVOT Vmean:  67.060 cm/s LVOT VTI:    0.203 m  AORTA Ao Root diam: 3.20 cm Ao Asc diam:  3.20 cm MITRAL VALVE                TRICUSPID VALVE MV Area (PHT): 3.30 cm     TR Peak grad:   39.7 mmHg MV Decel Time: 230 msec     TR Vmax:        315.00 cm/s MV E velocity: 112.80 cm/s  Estimated RAP:  3.00 mmHg MV A velocity: 44.34 cm/s   RVSP:           42.7 mmHg MV E/A ratio:  2.54                             SHUNTS                             Systemic VTI:  0.20 m                             Systemic Diam: 1.80 cm [name] Rouge MD Electronically signed by [name] Rouge MD Signature Date/Time: 08/09/2020/5:19:36 PM    Final     Assessment and Plan:   Macrocytic anemia/thrombocytopenia Fatigue Dyspnea on exertion Unsteady gait/balance disorder, followed by Dr. Tomi [name] CKD Atrial fibrillation Hypertension Sleep apnea  Ms. Payano has a macrocytic anemia and thrombocytopenia, new as compared to 6 months ago.  Dr. Benay Spice reviewed potential etiologies.  We are checking a B12 level, myeloma panel.  She will likely need a bone marrow biopsy.  We made a referral to Interventional Radiology to get this scheduled.  She will return for follow-up 08/22/2020.  We will adjust accordingly pending additional lab results from today.  Patient seen with Dr. Benay Spice.  Ned Card, NP 08/13/2020, 1:56 PM   This was a shared visit with Ned Card.  Ms. Omahoney was interviewed and examined.  I reviewed the peripheral blood smear.  She is referred for evaluation of anemia and thrombocytopenia.  The anemia and thrombocytopenia have developed over the past 9 months.  She has a macrocytic anemia of unclear etiology.  We are concerned she has a bone marrow process such as a hematopoietic  malignancy or myelodysplasia.  We added additional diagnostic labs today.  She will be referred for a bone marrow biopsy.  I was present for greater than 50% of today's visit.  I performed medical decision making.  [name], MD

## 2020-08-14 LAB — KAPPA/LAMBDA LIGHT CHAINS
Kappa free light chain: 29.1 mg/L — ABNORMAL HIGH (ref 3.3–19.4)
Kappa, lambda light chain ratio: 0.88 (ref 0.26–1.65)
Lambda free light chains: 32.9 mg/L — ABNORMAL HIGH (ref 5.7–26.3)

## 2020-08-15 LAB — MULTIPLE MYELOMA PANEL, SERUM
Albumin SerPl Elph-Mcnc: 3.2 g/dL (ref 2.9–4.4)
Albumin/Glob SerPl: 1.2 (ref 0.7–1.7)
Alpha 1: 0.3 g/dL (ref 0.0–0.4)
Alpha2 Glob SerPl Elph-Mcnc: 0.5 g/dL (ref 0.4–1.0)
B-Globulin SerPl Elph-Mcnc: 0.9 g/dL (ref 0.7–1.3)
Gamma Glob SerPl Elph-Mcnc: 0.9 g/dL (ref 0.4–1.8)
Globulin, Total: 2.7 g/dL (ref 2.2–3.9)
IgA: 328 mg/dL (ref 64–422)
IgG (Immunoglobin G), Serum: 1010 mg/dL (ref 586–1602)
IgM (Immunoglobulin M), Srm: 34 mg/dL (ref 26–217)
Total Protein ELP: 5.9 g/dL — ABNORMAL LOW (ref 6.0–8.5)

## 2020-08-19 ENCOUNTER — Other Ambulatory Visit: Payer: Self-pay | Admitting: Internal Medicine

## 2020-08-19 ENCOUNTER — Other Ambulatory Visit: Payer: Self-pay | Admitting: Nurse Practitioner

## 2020-08-19 DIAGNOSIS — D649 Anemia, unspecified: Secondary | ICD-10-CM

## 2020-08-20 ENCOUNTER — Ambulatory Visit (HOSPITAL_COMMUNITY)
Admission: RE | Admit: 2020-08-20 | Discharge: 2020-08-20 | Disposition: A | Discharge status: Home / Self Care | Payer: Medicare HMO | Source: Ambulatory Visit | Attending: Nurse Practitioner | Admitting: Nurse Practitioner

## 2020-08-20 ENCOUNTER — Encounter (HOSPITAL_COMMUNITY): Payer: Self-pay

## 2020-08-20 ENCOUNTER — Other Ambulatory Visit: Payer: Self-pay

## 2020-08-20 DIAGNOSIS — Z79890 Hormone replacement therapy (postmenopausal): Secondary | ICD-10-CM | POA: Insufficient documentation

## 2020-08-20 DIAGNOSIS — Z79899 Other long term (current) drug therapy: Secondary | ICD-10-CM | POA: Insufficient documentation

## 2020-08-20 DIAGNOSIS — D539 Nutritional anemia, unspecified: Secondary | ICD-10-CM | POA: Insufficient documentation

## 2020-08-20 DIAGNOSIS — I129 Hypertensive chronic kidney disease with stage 1 through stage 4 chronic kidney disease, or unspecified chronic kidney disease: Secondary | ICD-10-CM | POA: Insufficient documentation

## 2020-08-20 DIAGNOSIS — D696 Thrombocytopenia, unspecified: Secondary | ICD-10-CM | POA: Diagnosis not present

## 2020-08-20 DIAGNOSIS — G4730 Sleep apnea, unspecified: Secondary | ICD-10-CM | POA: Insufficient documentation

## 2020-08-20 DIAGNOSIS — I4891 Unspecified atrial fibrillation: Secondary | ICD-10-CM | POA: Diagnosis not present

## 2020-08-20 DIAGNOSIS — Z96651 Presence of right artificial knee joint: Secondary | ICD-10-CM | POA: Diagnosis not present

## 2020-08-20 DIAGNOSIS — E785 Hyperlipidemia, unspecified: Secondary | ICD-10-CM | POA: Diagnosis not present

## 2020-08-20 DIAGNOSIS — D61818 Other pancytopenia: Secondary | ICD-10-CM | POA: Insufficient documentation

## 2020-08-20 DIAGNOSIS — D649 Anemia, unspecified: Secondary | ICD-10-CM | POA: Diagnosis not present

## 2020-08-20 DIAGNOSIS — N183 Chronic kidney disease, stage 3 unspecified: Secondary | ICD-10-CM | POA: Insufficient documentation

## 2020-08-20 DIAGNOSIS — D7589 Other specified diseases of blood and blood-forming organs: Secondary | ICD-10-CM | POA: Insufficient documentation

## 2020-08-20 DIAGNOSIS — M1990 Unspecified osteoarthritis, unspecified site: Secondary | ICD-10-CM | POA: Diagnosis not present

## 2020-08-20 LAB — CBC WITH DIFFERENTIAL/PLATELET
Abs Immature Granulocytes: 0 10*3/uL (ref 0.00–0.07)
Basophils Absolute: 0 10*3/uL (ref 0.0–0.1)
Basophils Relative: 1 %
Eosinophils Absolute: 0.1 10*3/uL (ref 0.0–0.5)
Eosinophils Relative: 4 %
HCT: 31.7 % — ABNORMAL LOW (ref 36.0–46.0)
Hemoglobin: 10.1 g/dL — ABNORMAL LOW (ref 12.0–15.0)
Lymphocytes Relative: 24 %
Lymphs Abs: 0.8 10*3/uL (ref 0.7–4.0)
MCH: 32.5 pg (ref 26.0–34.0)
MCHC: 31.9 g/dL (ref 30.0–36.0)
MCV: 101.9 fL — ABNORMAL HIGH (ref 80.0–100.0)
Monocytes Absolute: 0.4 10*3/uL (ref 0.1–1.0)
Monocytes Relative: 13 %
Neutro Abs: 1.9 10*3/uL (ref 1.7–7.7)
Neutrophils Relative %: 58 %
Platelets: 154 10*3/uL (ref 150–400)
RBC: 3.11 MIL/uL — ABNORMAL LOW (ref 3.87–5.11)
RDW: 16 % — ABNORMAL HIGH (ref 11.5–15.5)
WBC: 3.3 10*3/uL — ABNORMAL LOW (ref 4.0–10.5)
nRBC: 0 % (ref 0.0–0.2)

## 2020-08-20 MED ORDER — MIDAZOLAM HCL 2 MG/2ML IJ SOLN
INTRAMUSCULAR | Status: AC | PRN
Start: 2020-08-20 — End: 2020-08-20
  Administered 2020-08-20 (×2): 1 mg via INTRAVENOUS

## 2020-08-20 MED ORDER — FENTANYL CITRATE (PF) 100 MCG/2ML IJ SOLN
INTRAMUSCULAR | Status: AC | PRN
  Administered 2020-08-20 (×2): 50 ug via INTRAVENOUS

## 2020-08-20 MED ORDER — SODIUM CHLORIDE 0.9 % IV SOLN: INTRAVENOUS | Status: DC

## 2020-08-20 MED ORDER — NALOXONE HCL 0.4 MG/ML IJ SOLN
INTRAMUSCULAR | Status: AC
  Filled 2020-08-20: qty 1

## 2020-08-20 MED ORDER — LIDOCAINE HCL (PF) 1 % IJ SOLN
INTRAMUSCULAR | Status: AC | PRN
  Administered 2020-08-20: 10 mL via INTRADERMAL

## 2020-08-20 MED ORDER — FLUMAZENIL 0.5 MG/5ML IV SOLN
INTRAVENOUS | Status: AC
  Filled 2020-08-20: qty 5

## 2020-08-20 MED ORDER — MIDAZOLAM HCL 2 MG/2ML IJ SOLN
INTRAMUSCULAR | Status: AC
  Filled 2020-08-20: qty 4

## 2020-08-20 MED ORDER — FENTANYL CITRATE (PF) 100 MCG/2ML IJ SOLN
INTRAMUSCULAR | Status: AC
  Filled 2020-08-20: qty 2

## 2020-08-20 NOTE — Discharge Instructions (Signed)
Urgent needs - Interventional Radiology on call MD [phone]  Wound - May remove dressing and shower tomorrow.  Keep site clean and dry.  Replace with bandaid as needed.  Do not submerge in tub or water until site healing well. If closed with glue, glue will flake off on its own.   

## 2020-08-20 NOTE — Procedures (Signed)
Interventional Radiology Procedure Note  Procedure: CT BM ASP AND CORE    Complications: None  Estimated Blood Loss:  MIN  Findings: 11 G CORE AND ASP    M. TREVOR Edmundo Tedesco, MD    

## 2020-08-20 NOTE — Consult Note (Addendum)
Chief Complaint: Patient was seen in consultation today for CT-guided bone marrow biopsy  Referring Physician(s): Sherrill,B  Supervising Physician: Daryll Brod  Patient Status: [facility] - Out-pt  History of Present Illness: [name] [name] is a 75 y.o. female with past medical history of arthritis, chronic kidney disease, anxiety/depression, GERD,atrial fibrillation, hyperlipidemia, hypertension, sleep apnea, prediabetes and now with anemia and thrombocytopenia of uncertain etiology.  She presents today for CT-guided bone marrow biopsy for further evaluation.  Past Medical History:  Diagnosis Date   Anxiety    Arthritis    Cataracts, bilateral    immature   CKD (chronic kidney disease) stage 3, GFR 30-59 ml/min (HCC)    Depression    GERD (gastroesophageal reflux disease)    was on Nexium    GERD (gastroesophageal reflux disease)    Hemorrhoid    History of bronchitis    couple of yrs ago   History of colon polyps    History of peristent atrial fibrillation    Hyperlipidemia    takes Pravastatin daily   Hypertension    takes Amlodipine and Diovan daily   Hypertension    Pre-diabetes    Sleep apnea    no cpap used   Sleep apnea    study done 20+yrs ago and doesn't use cpap   Urinary urgency    Weakness    in left leg r/t back    Past Surgical History:  Procedure Laterality Date   ABDOMINAL HYSTERECTOMY  1980   partial   ABDOMINAL HYSTERECTOMY     partial   BACK SURGERY     CARDIOVERSION N/A 02/24/2016   Procedure: CARDIOVERSION;  Surgeon: Fay Records, MD;  Location: [place];  Service: Cardiovascular;  Laterality: N/A;   CARDIOVERSION N/A 03/19/2016   Procedure: CARDIOVERSION;  Surgeon: Fay Records, MD;  Location: [place];  Service: Cardiovascular;  Laterality: N/A;   [place]     COLONOSCOPY     COLONOSCOPY WITH PROPOFOL  12/29/2011   Procedure: COLONOSCOPY WITH PROPOFOL;  Surgeon:  Lear Ng, MD;  Location: [facility];  Service: Endoscopy;  Laterality: N/A;   COLONOSCOPY WITH PROPOFOL N/A 05/24/2014   Procedure: COLONOSCOPY WITH PROPOFOL;  Surgeon: Wilford Corner, MD;  Location: [facility];  Service: Endoscopy;  Laterality: N/A;   ESOPHAGEAL MANOMETRY N/A 02/22/2017   Procedure: ESOPHAGEAL MANOMETRY (EM);  Surgeon: Wilford Corner, MD;  Location: [facility];  Service: Endoscopy;  Laterality: N/A;   ESOPHAGOGASTRODUODENOSCOPY     FOOT SURGERY     left, right   FRACTURE SURGERY     left leg-knee   HEEL SPUR EXCISION Bilateral    HOT HEMOSTASIS  12/29/2011   Procedure: HOT HEMOSTASIS (ARGON PLASMA COAGULATION/BICAP);  Surgeon: Lear Ng, MD;  Location: [facility];  Service: Endoscopy;  Laterality: N/A;   HOT HEMOSTASIS N/A 05/24/2014   Procedure: HOT HEMOSTASIS (ARGON PLASMA COAGULATION/BICAP);  Surgeon: Wilford Corner, MD;  Location: [facility];  Service: Endoscopy;  Laterality: N/A;   leg surgery d/t break Left    LUMBAR LAMINECTOMY/DECOMPRESSION MICRODISCECTOMY Left 04/05/2013   Procedure: LUMBAR FOUR TO FIVE LUMBAR LAMINECTOMY/DECOMPRESSION MICRODISCECTOMY 1 LEVEL;  Surgeon: Eustace Moore, MD;  Location: [place] [facility];  Service: Neurosurgery;  Laterality: Left;   NISSEN FUNDOPLICATION     [place] IMPEDANCE STUDY N/A 02/22/2017   Procedure: [place] IMPEDANCE STUDY;  Surgeon: Wilford Corner, MD;  Location: [facility];  Service: Endoscopy;  Laterality: N/A;   TEE WITHOUT CARDIOVERSION N/A 02/24/2016   Procedure: TRANSESOPHAGEAL ECHOCARDIOGRAM (TEE);  Surgeon: Fay Records, MD;  Location: [place];  Service: Cardiovascular;  Laterality: N/A;   TOTAL KNEE ARTHROPLASTY Left 10/07/2016   TOTAL KNEE ARTHROPLASTY Left 10/07/2016   Procedure: TOTAL KNEE ARTHROPLASTY;  Surgeon: [name], MD;  Location: [place];  Service: Orthopedics;  Laterality: Left;    Allergies: Omnicef [cefdinir], Lexapro [escitalopram], Penicillins, Peroxide [hydrogen  peroxide], Zoloft [sertraline hcl], and Neosporin [neomycin-bacitracin zn-polymyx]  Medications: Prior to Admission medications   Medication Sig Start Date End Date Taking? Authorizing Provider  amLODipine (NORVASC) 5 MG tablet Take 1 tablet (5 mg total) by mouth daily. 11/04/18  Yes Jerline Pain, MD  Ascorbic Acid (VITAMIN C WITH ROSE HIPS) 1000 MG tablet Take 1,000 mg by mouth daily.   Yes [provider]  buPROPion (WELLBUTRIN SR) 200 MG 12 hr tablet Take 400 mg by mouth daily.   Yes [provider]  CALCIUM PO Take 1 tablet by mouth every morning.   Yes [provider]  Cholecalciferol (VITAMIN D) 2000 UNITS tablet Take 4,000 Units by mouth daily.   Yes [provider]  ELIQUIS 5 MG TABS tablet TAKE 1 TABLET TWICE DAILY 04/08/20  Yes Jerline Pain, MD  fexofenadine (ALLEGRA) 180 MG tablet Take 180 mg by mouth daily as needed for allergies or rhinitis.   Yes [provider]  furosemide (LASIX) 20 MG tablet TAKE 1 TABLET EVERY DAY 01/08/20  Yes Jerline Pain, MD  levothyroxine (SYNTHROID) 75 MCG tablet TAKE 1 TABLET DAILY BEFORE BREAKFAST (NEED APPOINTMENT FOR FURTHER REFILLS) 07/23/20  Yes Renato Shin, MD  metoprolol succinate (TOPROL-XL) 25 MG 24 hr tablet TAKE 1/2 TABLET ONE TIME DAILY 02/02/20  Yes Jerline Pain, MD  potassium chloride SA (KLOR-CON) 20 MEQ tablet Take 1 tablet (20 mEq total) by mouth daily. 02/06/20  Yes Jerline Pain, MD  pravastatin (PRAVACHOL) 40 MG tablet Take 40 mg by mouth at bedtime.   Yes [provider]  vitamin B-12 (CYANOCOBALAMIN) 250 MCG tablet Take 250 mcg by mouth daily.   Yes [provider]  albuterol (VENTOLIN HFA) 108 (90 Base) MCG/ACT inhaler Inhale 1-2 puffs into the lungs every 4 (four) hours as needed for wheezing or shortness of breath. 07/22/18   Parrett, Fonnie Mu, NP  benzonatate (TESSALON) 200 MG capsule Take 1 capsule by mouth three times daily as needed for cough 05/22/19   Parrett,  Tammy S, NP  busPIRone (BUSPAR) 10 MG tablet Take 10 mg by mouth 3 (three) times daily.    [provider]  [employer] MELATONIN PO Take 1 tablet by mouth at bedtime as needed.    [provider]  Spacer/Aero-Holding Chambers (AEROCHAMBER MV) inhaler Use as instructed 10/13/18   Baird Lyons D, MD  Tiotropium Bromide Monohydrate (SPIRIVA RESPIMAT) 2.5 MCG/ACT AERS Inhale 2 puffs into the lungs daily. 08/02/20   Deneise Lever, MD     Family History  Problem Relation Age of Onset   Colon cancer Mother    Heart disease Father    Breast cancer Neg Hx    Thyroid disease Neg Hx     Social History   Socioeconomic History   Marital status: Married    Spouse name: Dominica Severin   Number of children: 2   Years of education: 12   Highest education level: Some college, no degree  Occupational History   Occupation: retired  Tobacco Use   Smoking status: Never   Smokeless tobacco: Never  Vaping Use   Vaping Use: Never used  Substance and Sexual Activity   Alcohol use: No   Drug use: No   Sexual activity: Yes    Birth control/protection: Surgical  Other Topics Concern   Not on file  Social History Narrative   Right handed   Drinks caffeine   Two story home   Social Determinants of Health   Financial Resource Strain: Not on file  Food Insecurity: Not on file  Transportation Needs: Not on file  Physical Activity: Not on file  Stress: Not on file  Social Connections: Not on file      Review of Systems denies fever, headache, chest pain, dyspnea, cough, abdominal/back pain, nausea, vomiting or bleeding  Vital Signs: BP (!) 145/70   Pulse 69   Temp 98.2 F (36.8 C) (Oral)   Resp 16   SpO2 96%   Physical Exam awake, alert.  Chest clear to auscultation bilaterally.  Heart with regular rate and rhythm.  Abdomen soft, positive bowel sounds, nontender.  No lower extremity edema.  Imaging: DG Chest 2 View  Result Date: 07/31/2020 CLINICAL DATA:  Shortness of breath.  EXAM: CHEST - 2 VIEW COMPARISON:  06/21/2020. FINDINGS: Mediastinum and hilar structures normal. Stable cardiomegaly. No pulmonary venous congestion. No focal infiltrate. No pleural effusion or pneumothorax. No acute bony abnormality identified. IMPRESSION: 1.  Stable cardiomegaly.  No pulmonary venous congestion. 2.  No acute pulmonary disease.  Chest is stable from prior exam. Electronically Signed   By: Marcello Moores  Register   On: 07/31/2020 09:42   ECHOCARDIOGRAM COMPLETE  Result Date: 08/09/2020    ECHOCARDIOGRAM REPORT   Patient Name:   CHAYCE RULLO Date of Exam: 08/09/2020 Medical Rec #:  [id number]     Height:       67.0 in Accession #:    [id number]    Weight:       230.0 lb Date of Birth:  1945/01/28      BSA:          2.146 m Patient Age:    37 years      BP:           112/58 mmHg Patient Gender: F             HR:           68 bpm. Exam Location:  [place] Procedure: 2D Echo, Cardiac Doppler and Color Doppler Indications:    R06.00 SOB  History:        Patient has prior history of Echocardiogram examinations, most                 recent 11/11/2018. Arrythmias:Atrial Fibrillation,                 Signs/Symptoms:Shortness of Breath; Risk Factors:Hypertension,                 Dyslipidemia and Obesity.  Sonographer:    Coralyn Helling RDCS Referring Phys: [phone] MARK C SKAINS  Sonographer Comments: Patient is morbidly obese. Image acquisition challenging due to patient body habitus and Image acquisition challenging due to respiratory motion. IMPRESSIONS  1. Left ventricular ejection fraction, by estimation, is 60 to 65%. The left ventricle has normal function. The left ventricle has no regional wall motion abnormalities. Left ventricular diastolic parameters were normal.  2. Right ventricular systolic function is normal. The right ventricular size is normal.  3.  Left atrial size was moderately dilated.  4. The mitral valve is degenerative. Mild mitral valve regurgitation. No evidence of mitral stenosis.  Moderate mitral annular calcification.  5. The aortic valve is normal in structure. Aortic valve regurgitation is not visualized. No aortic stenosis is present.  6. The inferior vena cava is normal in size with greater than 50% respiratory variability, suggesting right atrial pressure of 3 mmHg. FINDINGS  Left Ventricle: Left ventricular ejection fraction, by estimation, is 60 to 65%. The left ventricle has normal function. The left ventricle has no regional wall motion abnormalities. The left ventricular internal cavity size was normal in size. There is  no left ventricular hypertrophy. Left ventricular diastolic parameters were normal. Right Ventricle: The right ventricular size is normal. No increase in right ventricular wall thickness. Right ventricular systolic function is normal. Left Atrium: Left atrial size was moderately dilated. Right Atrium: Right atrial size was normal in size. Pericardium: There is no evidence of pericardial effusion. Mitral Valve: The mitral valve is degenerative in appearance. There is moderate thickening of the mitral valve leaflet(s). There is moderate calcification of the mitral valve leaflet(s). Moderate mitral annular calcification. Mild mitral valve regurgitation. No evidence of mitral valve stenosis. Tricuspid Valve: The tricuspid valve is normal in structure. Tricuspid valve regurgitation is mild . No evidence of tricuspid stenosis. Aortic Valve: The aortic valve is normal in structure. Aortic valve regurgitation is not visualized. No aortic stenosis is present. Pulmonic Valve: The pulmonic valve was normal in structure. Pulmonic valve regurgitation is not visualized. No evidence of pulmonic stenosis. Aorta: The aortic root is normal in size and structure. Venous: The inferior vena cava is normal in size with greater than 50% respiratory variability, suggesting right atrial pressure of 3 mmHg. IAS/Shunts: No atrial level shunt detected by color flow Doppler.  LEFT VENTRICLE  PLAX 2D LVIDd:         4.80 cm  Diastology LVIDs:         3.40 cm  LV e' medial:    8.81 cm/s LV PW:         1.20 cm  LV E/e' medial:  12.8 LV IVS:        1.00 cm  LV e' lateral:   10.95 cm/s LVOT diam:     1.80 cm  LV E/e' lateral: 10.3 LV SV:         52 LV SV Index:   24 LVOT Area:     2.54 cm  RIGHT VENTRICLE            IVC RVSP:           42.7 mmHg  IVC diam: 1.20 cm LEFT ATRIUM             Index       RIGHT ATRIUM           Index LA diam:        4.40 cm 2.05 cm/m  RA Pressure: 3.00 mmHg LA Vol (A2C):   73.5 ml 34.25 ml/m RA Area:     13.30 cm LA Vol (A4C):   53.7 ml 25.02 ml/m RA Volume:   30.80 ml  14.35 ml/m LA Biplane Vol: 64.7 ml 30.15 ml/m  AORTIC VALVE LVOT Vmax:   91.72 cm/s LVOT Vmean:  67.060 cm/s LVOT VTI:    0.203 m  AORTA Ao Root diam: 3.20 cm Ao Asc diam:  3.20 cm MITRAL VALVE  TRICUSPID VALVE MV Area (PHT): 3.30 cm     TR Peak grad:   39.7 mmHg MV Decel Time: 230 msec     TR Vmax:        315.00 cm/s MV E velocity: 112.80 cm/s  Estimated RAP:  3.00 mmHg MV A velocity: 44.34 cm/s   RVSP:           42.7 mmHg MV E/A ratio:  2.54                             SHUNTS                             Systemic VTI:  0.20 m                             Systemic Diam: 1.80 cm Jenkins Rouge MD Electronically signed by Jenkins Rouge MD Signature Date/Time: 08/09/2020/5:19:36 PM    Final     Labs:  CBC: Recent Labs    11/02/19 1151 07/31/20 0925 08/13/20 1251 08/20/20 0751  WBC 5.8 4.6 4.4 3.3*  HGB 13.7 10.7* 10.6* 10.1*  HCT 41.7 31.3* 34.5* 31.7*  PLT 200 104* 114* 154    COAGS: No results for input(s): INR, APTT in the last 8760 hours.  BMP: Recent Labs    07/31/20 0925  NA 137  K 4.8  CL 101  CO2 24  GLUCOSE 116*  BUN 15  CALCIUM 9.1  CREATININE 1.58*    LIVER FUNCTION TESTS: Recent Labs    11/02/19 1151 07/31/20 0925  BILITOT  --  0.9  AST  --  12  ALT 22 5  ALKPHOS  --  71  PROT  --  6.1  ALBUMIN  --  3.8    TUMOR MARKERS: No results for  input(s): AFPTM, CEA, CA199, CHROMGRNA in the last 8760 hours.  Assessment and Plan: 75 y.o. female with past medical history of arthritis, chronic kidney disease, anxiety/depression, GERD,atrial fibrillation, hyperlipidemia, hypertension, sleep apnea, prediabetes and now with anemia and thrombocytopenia of uncertain etiology.  She presents today for CT-guided bone marrow biopsy for further evaluation.Risks and benefits of procedure was discussed with the patient including, but not limited to bleeding, infection, damage to adjacent structures or low yield requiring additional tests.  All of the questions were answered and there is agreement to proceed.  Consent signed and in chart.    Thank you for this interesting consult.  I greatly enjoyed meeting [name] [name] and look forward to participating in their care.  A copy of this report was sent to the requesting provider on this date.  Electronically Signed: D. Rowe Robert, PA-C 08/20/2020, 8:31 AM   I spent a total of  20 minutes   in face to face in clinical consultation, greater than 50% of which was counseling/coordinating care for CT-guided bone marrow biopsy

## 2020-08-22 ENCOUNTER — Inpatient Hospital Stay: Payer: Medicare HMO

## 2020-08-22 ENCOUNTER — Ambulatory Visit: Payer: Medicare HMO | Admitting: Neurology

## 2020-08-22 ENCOUNTER — Inpatient Hospital Stay: Payer: Medicare HMO | Admitting: Nurse Practitioner

## 2020-08-22 ENCOUNTER — Encounter: Payer: Self-pay | Admitting: Nurse Practitioner

## 2020-08-22 ENCOUNTER — Other Ambulatory Visit: Payer: Self-pay

## 2020-08-22 VITALS — BP 147/58 | HR 69 | Temp 97.8°F | Resp 18 | Ht 67.0 in | Wt 220.8 lb

## 2020-08-22 DIAGNOSIS — E785 Hyperlipidemia, unspecified: Secondary | ICD-10-CM | POA: Diagnosis not present

## 2020-08-22 DIAGNOSIS — D696 Thrombocytopenia, unspecified: Secondary | ICD-10-CM | POA: Diagnosis not present

## 2020-08-22 DIAGNOSIS — D649 Anemia, unspecified: Secondary | ICD-10-CM | POA: Diagnosis not present

## 2020-08-22 DIAGNOSIS — E039 Hypothyroidism, unspecified: Secondary | ICD-10-CM | POA: Diagnosis not present

## 2020-08-22 DIAGNOSIS — I4891 Unspecified atrial fibrillation: Secondary | ICD-10-CM | POA: Diagnosis not present

## 2020-08-22 DIAGNOSIS — K219 Gastro-esophageal reflux disease without esophagitis: Secondary | ICD-10-CM | POA: Diagnosis not present

## 2020-08-22 DIAGNOSIS — N189 Chronic kidney disease, unspecified: Secondary | ICD-10-CM | POA: Diagnosis not present

## 2020-08-22 DIAGNOSIS — R0609 Other forms of dyspnea: Secondary | ICD-10-CM | POA: Diagnosis not present

## 2020-08-22 DIAGNOSIS — I10 Essential (primary) hypertension: Secondary | ICD-10-CM | POA: Diagnosis not present

## 2020-08-22 LAB — DIRECT ANTIGLOBULIN TEST (NOT AT ARMC)
DAT, IgG: NEGATIVE
DAT, complement: NEGATIVE

## 2020-08-22 LAB — SEDIMENTATION RATE: Sed Rate: 32 mm/hr — ABNORMAL HIGH (ref 0–22)

## 2020-08-22 LAB — CBC WITH DIFFERENTIAL (CANCER CENTER ONLY)
Abs Immature Granulocytes: 0 10*3/uL (ref 0.00–0.07)
Basophils Absolute: 0.1 10*3/uL (ref 0.0–0.1)
Basophils Relative: 2 %
Eosinophils Absolute: 0.1 10*3/uL (ref 0.0–0.5)
Eosinophils Relative: 2 %
HCT: 33.4 % — ABNORMAL LOW (ref 36.0–46.0)
Hemoglobin: 10.7 g/dL — ABNORMAL LOW (ref 12.0–15.0)
Lymphocytes Relative: 40 %
Lymphs Abs: 1.6 10*3/uL (ref 0.7–4.0)
MCH: 32.2 pg (ref 26.0–34.0)
MCHC: 32 g/dL (ref 30.0–36.0)
MCV: 100.6 fL — ABNORMAL HIGH (ref 80.0–100.0)
Monocytes Absolute: 0.2 10*3/uL (ref 0.1–1.0)
Monocytes Relative: 6 %
Neutro Abs: 2.1 10*3/uL (ref 1.7–7.7)
Neutrophils Relative %: 50 %
Platelet Count: 187 10*3/uL (ref 150–400)
RBC: 3.32 MIL/uL — ABNORMAL LOW (ref 3.87–5.11)
RDW: 16.8 % — ABNORMAL HIGH (ref 11.5–15.5)
WBC Count: 4.1 10*3/uL (ref 4.0–10.5)
nRBC: 0 % (ref 0.0–0.2)

## 2020-08-22 LAB — LACTATE DEHYDROGENASE: LDH: 347 U/L — ABNORMAL HIGH (ref 98–192)

## 2020-08-22 NOTE — Progress Notes (Signed)
  [place] OFFICE PROGRESS NOTE   Diagnosis: Anemia and thrombocytopenia  INTERVAL HISTORY:   [name] [name] returns as scheduled.  She underwent a bone marrow biopsy 08/20/2020.  Pathology shows a hypercellular marrow with erythroid hyperplasia and dyspoiesis, rare lipogranuloma like lesions.  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  NGS myeloid panel pending.  Cytogenetics and FISH pending.  No fever, sweats, cough, shortness of breath.  Mouth remains "sore".  She is fatigued.  Objective:  Vital signs in last 24 hours:  Blood pressure (!) 147/58, pulse 69, temperature 97.8 F (36.6 C), temperature source Oral, resp. rate 18, height $RemoveBe'5\' 7"'EObgmZkzt$  (1.702 m), weight 220 lb 12.8 oz (100.2 kg), SpO2 98 %.    Resp: Lungs clear bilaterally. Cardio: Regular rate and rhythm. GI: Abdomen soft and nontender.  No hepatosplenomegaly. Vascular: No leg edema. Skin: Bone marrow site right posterior iliac region without erythema.   Lab Results:  Lab Results  Component Value Date   WBC 3.3 (L) 08/20/2020   HGB 10.1 (L) 08/20/2020   HCT 31.7 (L) 08/20/2020   MCV 101.9 (H) 08/20/2020   PLT 154 08/20/2020   NEUTROABS 1.9 08/20/2020    Imaging:  No results found.  Medications: I have reviewed the patient's current medications.  Assessment/Plan: Macrocytic anemia/thrombocytopenia 08/13/2020 B12 3500 08/13/2020 LDH 612 08/13/2020 myeloma panel-no monoclonal protein  Bone marrow biopsy 08/20/2020-hypercellular marrow with erythroid hyperplasia and dyspoiesis, rare lipogranuloma like lesions.  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 Fatigue Dyspnea on exertion Unsteady gait/balance disorder, followed by Dr. Tomi Likens CKD Atrial fibrillation Hypertension Sleep apnea  Disposition: [name] [name] appears stable.  Bone marrow biopsy findings are concerning for myelodysplasia.  Dr. Benay Spice reviewed this with her at today's visit.  We will follow-up on the  cytogenetic results.  We are obtaining additional labs today to include repeat LDH, DAT, haptoglobin, sed rate and erythropoietin.  She will return for a CBC and follow-up visit in 2 months.  We are available to see her sooner if needed.  We specifically discussed signs/symptoms suggestive of progressive anemia.  Patient seen with Dr. Benay Spice.    Ned Card ANP/GNP-BC   08/22/2020  2:38 PM This was a shared visit with Ned Card.  We discussed the bone marrow findings with [name] [name].  She has persistent macrocytic anemia.  We will obtain additional laboratory evaluation to rule out hemolysis.  She appears to have myelodysplasia.  We will follow-up on the cytogenetics and myeloma FISH panel.  The plan is observation.  We will consider initiating treatment based on the cytogenetics, erythropoietin level, and repeat CBC.  I was present for greater than 50% of today's visit.  I performed medical decision making.  [name], MD

## 2020-08-23 ENCOUNTER — Telehealth: Payer: Self-pay | Admitting: Nurse Practitioner

## 2020-08-23 ENCOUNTER — Telehealth: Payer: Self-pay | Admitting: *Deleted

## 2020-08-23 ENCOUNTER — Other Ambulatory Visit: Payer: Self-pay | Admitting: Nurse Practitioner

## 2020-08-23 DIAGNOSIS — R142 Eructation: Secondary | ICD-10-CM | POA: Diagnosis not present

## 2020-08-23 DIAGNOSIS — R630 Anorexia: Secondary | ICD-10-CM | POA: Diagnosis not present

## 2020-08-23 DIAGNOSIS — D649 Anemia, unspecified: Secondary | ICD-10-CM | POA: Diagnosis not present

## 2020-08-23 DIAGNOSIS — R195 Other fecal abnormalities: Secondary | ICD-10-CM | POA: Diagnosis not present

## 2020-08-23 DIAGNOSIS — R634 Abnormal weight loss: Secondary | ICD-10-CM | POA: Diagnosis not present

## 2020-08-23 NOTE — Telephone Encounter (Signed)
   [facility] Pre-operative Risk Assessment    Patient Name: [name] [name]  DOB: Nov 20, 1945 MRN: [id number]  HEARTCARE STAFF:  - IMPORTANT!!!!!! Under Visit Info/Reason for Call, type in Other and utilize the format Clearance MM/DD/YY or Clearance TBD. Do not use dashes or single digits. - Please review there is not already an duplicate clearance open for this procedure. - If request is for dental extraction, please clarify the # of teeth to be extracted. - If the patient is currently at the dentist's office, call Pre-Op Callback Staff (MA/nurse) to input urgent request.  - If the patient is not currently in the dentist office, please route to the Pre-Op pool.  Request for surgical clearance:  What type of surgery is being performed? COLONOSCOPY/ENDOSCOPY  When is this surgery scheduled? 12/18/20  What type of clearance is required (medical clearance vs. Pharmacy clearance to hold med vs. Both)? BOTH  Are there any medications that need to be held prior to surgery and how long?  [place] name and name of physician performing surgery? [facility]; DR. Michail [name]  What is the office phone number? [phone]   7.   What is the office fax number? [phone]  8.   Anesthesia type (None, local, MAC, general) ? PROPOFOL    [name] [name] 08/23/2020, 3:18 PM  _________________________________________________________________   (provider comments below)

## 2020-08-23 NOTE — Telephone Encounter (Signed)
Attempted to contact Ms. Fulfer.  Message left for her to please contact the office regarding the questions she sent via [place].

## 2020-08-23 NOTE — Telephone Encounter (Signed)
   Name:  IZABELLA HANSEN  DOB:  1946-01-09  MRN:  [id number]   Primary Cardiologist: Candee Furbish, MD  Chart reviewed as part of pre-operative protocol coverage. Procedure is not scheduled until 12/2020. Will request that the gastroenterology office resubmit the clearance request 10/2020 in anticipation of her upcoming EGD/C-scope so that her preop status can be better and more acutely assessed closer to the time of her procedure.   I will route this recommendation to the requesting surgeons office and remove from the preop pool at this time.   Abigail Butts, PA-C 08/23/2020, 3:37 PM

## 2020-08-24 LAB — HAPTOGLOBIN: Haptoglobin: <10 mg/dL — ABNORMAL LOW (ref 42–346)

## 2020-08-24 LAB — ERYTHROPOIETIN: Erythropoietin: 38.9 m[IU]/mL — ABNORMAL HIGH (ref 2.6–18.5)

## 2020-08-26 ENCOUNTER — Telehealth: Payer: Self-pay | Admitting: Oncology

## 2020-08-26 ENCOUNTER — Other Ambulatory Visit: Payer: Self-pay | Admitting: Nurse Practitioner

## 2020-08-26 ENCOUNTER — Inpatient Hospital Stay: Payer: Medicare HMO

## 2020-08-26 ENCOUNTER — Ambulatory Visit: Payer: Medicare HMO | Admitting: Oncology

## 2020-08-26 ENCOUNTER — Other Ambulatory Visit: Payer: Self-pay

## 2020-08-26 ENCOUNTER — Telehealth: Payer: Self-pay | Admitting: Nurse Practitioner

## 2020-08-26 DIAGNOSIS — D649 Anemia, unspecified: Secondary | ICD-10-CM

## 2020-08-26 DIAGNOSIS — E785 Hyperlipidemia, unspecified: Secondary | ICD-10-CM | POA: Diagnosis not present

## 2020-08-26 DIAGNOSIS — D696 Thrombocytopenia, unspecified: Secondary | ICD-10-CM | POA: Diagnosis not present

## 2020-08-26 DIAGNOSIS — E039 Hypothyroidism, unspecified: Secondary | ICD-10-CM | POA: Diagnosis not present

## 2020-08-26 DIAGNOSIS — K219 Gastro-esophageal reflux disease without esophagitis: Secondary | ICD-10-CM | POA: Diagnosis not present

## 2020-08-26 DIAGNOSIS — R0609 Other forms of dyspnea: Secondary | ICD-10-CM | POA: Diagnosis not present

## 2020-08-26 DIAGNOSIS — I4891 Unspecified atrial fibrillation: Secondary | ICD-10-CM | POA: Diagnosis not present

## 2020-08-26 DIAGNOSIS — N189 Chronic kidney disease, unspecified: Secondary | ICD-10-CM | POA: Diagnosis not present

## 2020-08-26 DIAGNOSIS — I10 Essential (primary) hypertension: Secondary | ICD-10-CM | POA: Diagnosis not present

## 2020-08-26 LAB — RETIC PANEL
Immature Retic Fract: 20.7 % — ABNORMAL HIGH (ref 2.3–15.9)
RBC.: 3.14 MIL/uL — ABNORMAL LOW (ref 3.87–5.11)
Retic Count, Absolute: 155.1 10*3/uL (ref 19.0–186.0)
Retic Ct Pct: 4.9 % — ABNORMAL HIGH (ref 0.4–3.1)
Reticulocyte Hemoglobin: 33.8 pg (ref >27.9)

## 2020-08-26 LAB — CBC WITH DIFFERENTIAL (CANCER CENTER ONLY)
Abs Immature Granulocytes: 0 10*3/uL (ref 0.00–0.07)
Basophils Absolute: 0 10*3/uL (ref 0.0–0.1)
Basophils Relative: 1 %
Eosinophils Absolute: 0.1 10*3/uL (ref 0.0–0.5)
Eosinophils Relative: 2 %
HCT: 30.9 % — ABNORMAL LOW (ref 36.0–46.0)
Hemoglobin: 10 g/dL — ABNORMAL LOW (ref 12.0–15.0)
Lymphocytes Relative: 44 %
Lymphs Abs: 1.6 10*3/uL (ref 0.7–4.0)
MCH: 32.5 pg (ref 26.0–34.0)
MCHC: 32.4 g/dL (ref 30.0–36.0)
MCV: 100.3 fL — ABNORMAL HIGH (ref 80.0–100.0)
Monocytes Absolute: 0.3 10*3/uL (ref 0.1–1.0)
Monocytes Relative: 7 %
Neutro Abs: 1.7 10*3/uL (ref 1.7–7.7)
Neutrophils Relative %: 46 %
Platelet Count: 181 10*3/uL (ref 150–400)
RBC: 3.08 MIL/uL — ABNORMAL LOW (ref 3.87–5.11)
RDW: 18.1 % — ABNORMAL HIGH (ref 11.5–15.5)
Schistocytes: NONE SEEN
WBC Count: 3.7 10*3/uL — ABNORMAL LOW (ref 4.0–10.5)
nRBC: 0 % (ref 0.0–0.2)

## 2020-08-26 LAB — DIC (DISSEMINATED INTRAVASCULAR COAGULATION)PANEL
D-Dimer, Quant: 0.27 ug/mL-FEU (ref 0.00–0.50)
Fibrinogen: 282 mg/dL (ref 210–475)
INR: 1.3 — ABNORMAL HIGH (ref 0.8–1.2)
Platelets: 181 10*3/uL (ref 150–400)
Prothrombin Time: 16.2 seconds — ABNORMAL HIGH (ref 11.4–15.2)
Smear Review: NONE SEEN
aPTT: 34 seconds (ref 24–36)

## 2020-08-26 NOTE — Telephone Encounter (Signed)
I reviewed [name] [name]'s case with her daughter by phone.  Questions answered.  She understands some data is still outstanding and we will be making additional plans/recommendations based on those results.

## 2020-08-26 NOTE — Telephone Encounter (Signed)
Scheduled appt per 8/15 sch msg - patient is aware of appt today and in 2 weeks to see Dr. Benay Spice.

## 2020-08-27 ENCOUNTER — Encounter (HOSPITAL_COMMUNITY): Payer: Self-pay | Admitting: Oncology

## 2020-08-28 ENCOUNTER — Encounter (HOSPITAL_COMMUNITY): Payer: Self-pay | Admitting: Oncology

## 2020-08-28 NOTE — Progress Notes (Signed)
NEUROLOGY FOLLOW UP OFFICE NOTE  [name] [name] FM:[id number]  Assessment/Plan:   Unsteady gait.  Neurologic workup unremarkable.  No evidence of intracranial abnormality, vestibulopathy, myelopathy, lumbar spinal stenosis, myopathy, peripheral neuropathy, or neurodegenerative disease such as Parkinson's disease.  No further recommendations or workup indicated at this time.    Subjective:  [name] [name] is a 75 year old right-handed female with hypertensive CKD, paroxysmal atrial fibrillation, pre-diabetes, hypothyroidism, depression, and bilateral cataract who follows up for balance disorder.  MRI of neuroaxis personally reviewed.  UPDATE: Underwent workup for balance disorder. MRI of brain on 04/27/2020 showed mild atrophy and minor chronic small vessel ischemic changes in the cerebral hemispheres but nothing significant MRI cervical spine on 04/06/2020 showed multilevel spondylosis with foraminal narrowing but no significant canal stenosis causing myelopathy. MRI of thoracic spine on 05/18/2020 was normal. MRI of lumbar spine on 05/03/2020 showed multilevel degenerative changes without significant foraminal or spinal stenosis NCV-EMG of lower extremities on 05/28/2020 was normal.   HISTORY: Since at least 2019, she has had difficulty with balance.  When walking, she will sometimes bump into walls.  She has had several falls.  A couple of times, they were due to tripping.  However, she may just be standing and then take a step back and fall backwards.  She says that her hands sometime shake, usually while holding something but also at rest.  When she sits, her legs may shake as well.  When she leans forward while sitting down into a chair, she may lose her balance.  She denies dizziness, double vision, low back pain, weakness in legs or numbness in feet.  She denies neck pain but feels grinding in her neck when she turns her head.  Her orthopedist told her that she has arthritis in her cervical  spine.  She has osteoarthritis of her knees and has previously undergone left knee replacement.  She has prior history of left ankle surgery.  She has bone spurs in her feet.  She has prior history of lumbar spine surgery.  Last MRI of lumbar spine from 01/17/2016 showed left laminectomy at L4-5 with recurrent left paramedian disc protrusion as well as left lateral disc protrusion at L3-4.  Labs from September 2021 include Hgb A1c 6.1 and TSH 0.65.  B12 level from August 2022 was 3,598 and SPEP/IFE negative for M-spike.  PAST MEDICAL HISTORY: Past Medical History:  Diagnosis Date   Anxiety    Arthritis    Cataracts, bilateral    immature   CKD (chronic kidney disease) stage 3, GFR 30-59 ml/min (HCC)    Depression    GERD (gastroesophageal reflux disease)    was on Nexium    GERD (gastroesophageal reflux disease)    Hemorrhoid    History of bronchitis    couple of yrs ago   History of colon polyps    History of peristent atrial fibrillation    Hyperlipidemia    takes Pravastatin daily   Hypertension    takes Amlodipine and Diovan daily   Hypertension    Pre-diabetes    Sleep apnea    no cpap used   Sleep apnea    study done 20+yrs ago and doesn't use cpap   Urinary urgency    Weakness    in left leg r/t back    MEDICATIONS: Current Outpatient Medications on File Prior to Visit  Medication Sig Dispense Refill   albuterol (VENTOLIN HFA) 108 (90 Base) MCG/ACT inhaler Inhale 1-2 puffs into the  lungs every 4 (four) hours as needed for wheezing or shortness of breath. 1 g 1   amLODipine (NORVASC) 5 MG tablet Take 1 tablet (5 mg total) by mouth daily. 90 tablet 3   Ascorbic Acid (VITAMIN C WITH ROSE HIPS) 1000 MG tablet Take 1,000 mg by mouth daily.     benzonatate (TESSALON) 200 MG capsule Take 1 capsule by mouth three times daily as needed for cough 60 capsule 0   buPROPion (WELLBUTRIN SR) 200 MG 12 hr tablet Take 400 mg by mouth daily.     busPIRone (BUSPAR) 10 MG tablet Take 10 mg  by mouth 3 (three) times daily.     CALCIUM PO Take 1 tablet by mouth every morning.     Cholecalciferol (VITAMIN D) 2000 UNITS tablet Take 4,000 Units by mouth daily.     [employer] MELATONIN PO Take 1 tablet by mouth at bedtime as needed.     ELIQUIS 5 MG TABS tablet TAKE 1 TABLET TWICE DAILY 180 tablet 1   fexofenadine (ALLEGRA) 180 MG tablet Take 180 mg by mouth daily as needed for allergies or rhinitis.     furosemide (LASIX) 20 MG tablet TAKE 1 TABLET EVERY DAY 90 tablet 3   levothyroxine (SYNTHROID) 75 MCG tablet TAKE 1 TABLET DAILY BEFORE BREAKFAST (NEED APPOINTMENT FOR FURTHER REFILLS) 90 tablet 0   metoprolol succinate (TOPROL-XL) 25 MG 24 hr tablet TAKE 1/2 TABLET ONE TIME DAILY 45 tablet 3   potassium chloride SA (KLOR-CON) 20 MEQ tablet Take 1 tablet (20 mEq total) by mouth daily. 90 tablet 2   pravastatin (PRAVACHOL) 40 MG tablet Take 40 mg by mouth at bedtime.     Spacer/Aero-Holding Chambers (AEROCHAMBER MV) inhaler Use as instructed 1 each 0   Tiotropium Bromide Monohydrate (SPIRIVA RESPIMAT) 2.5 MCG/ACT AERS Inhale 2 puffs into the lungs daily. 2 each 0   vitamin B-12 (CYANOCOBALAMIN) 250 MCG tablet Take 250 mcg by mouth daily.     No current facility-administered medications on file prior to visit.    ALLERGIES: Allergies  Allergen Reactions   Omnicef [Cefdinir] Swelling    Tongue swelling   Lexapro [Escitalopram]     Makes her feel funny   Penicillins     Pt cannot remember reaction she had as hadn't been on it in about 20 years but stated that she knows she is allergic to it.   Peroxide [Hydrogen Peroxide] Other (See Comments)    Redness.    Zoloft [Sertraline Hcl]     Makes her feel funny   Neosporin [Neomycin-Bacitracin Zn-Polymyx] Rash    Blisters, itching.     FAMILY HISTORY: Family History  Problem Relation Age of Onset   Colon cancer Mother    Heart disease Father    Breast cancer Neg Hx    Thyroid disease Neg Hx       Objective:  Blood pressure (!)  116/54, pulse 61, height '5\' 7"'$  (1.702 m), weight 226 lb (102.5 kg), SpO2 93 %. General: No acute distress.  Patient appears well-groomed.   Head:  Normocephalic/atraumatic Eyes:  Fundi examined but not visualized Neck: supple, no paraspinal tenderness, full range of motion Heart:  Regular rate and rhythm Lungs:  Clear to auscultation bilaterally Back: No paraspinal tenderness Neurological Exam: alert and oriented to person, place, and time.  Speech fluent and not dysarthric, language intact.  CN II-XII intact. Bulk and tone normal, muscle strength 5/5 throughout.  Very mild/fine tremor in hands when outstretched.  No resting tremor  noted.  No bradykinesia  Sensation to light touch intact.  Deep tendon reflexes 2+ throughout, toes downgoing.  Finger to nose testing intact.  Gait upright posture, normal arm swing, steady.  Able to turn.  Romberg with slight sway.   Metta Clines, DO  CC: Harlan Stains, MD

## 2020-08-29 ENCOUNTER — Other Ambulatory Visit: Payer: Self-pay

## 2020-08-29 ENCOUNTER — Ambulatory Visit: Payer: Medicare HMO | Admitting: Neurology

## 2020-08-29 ENCOUNTER — Encounter: Payer: Self-pay | Admitting: Neurology

## 2020-08-29 VITALS — BP 116/54 | HR 61 | Ht 67.0 in | Wt 226.0 lb

## 2020-08-29 DIAGNOSIS — R2681 Unsteadiness on feet: Secondary | ICD-10-CM

## 2020-08-29 NOTE — Patient Instructions (Signed)
I don't have any neurologic explanation for your symptoms.  Neurologic workup unremarkable Follow up as needed

## 2020-08-30 LAB — PNH PROFILE (-HIGH SENSITIVITY)

## 2020-09-05 ENCOUNTER — Encounter (HOSPITAL_COMMUNITY): Payer: Self-pay | Admitting: Oncology

## 2020-09-05 LAB — SURGICAL PATHOLOGY

## 2020-09-12 ENCOUNTER — Inpatient Hospital Stay: Payer: Medicare HMO | Attending: Oncology | Admitting: Oncology

## 2020-09-12 ENCOUNTER — Inpatient Hospital Stay: Payer: Medicare HMO

## 2020-09-12 ENCOUNTER — Telehealth: Payer: Self-pay

## 2020-09-12 ENCOUNTER — Other Ambulatory Visit: Payer: Self-pay

## 2020-09-12 VITALS — BP 136/63 | HR 68 | Temp 98.7°F | Resp 20 | Ht 67.0 in | Wt 221.6 lb

## 2020-09-12 DIAGNOSIS — R5383 Other fatigue: Secondary | ICD-10-CM | POA: Diagnosis not present

## 2020-09-12 DIAGNOSIS — D696 Thrombocytopenia, unspecified: Secondary | ICD-10-CM | POA: Diagnosis not present

## 2020-09-12 DIAGNOSIS — Z79899 Other long term (current) drug therapy: Secondary | ICD-10-CM | POA: Diagnosis not present

## 2020-09-12 DIAGNOSIS — I129 Hypertensive chronic kidney disease with stage 1 through stage 4 chronic kidney disease, or unspecified chronic kidney disease: Secondary | ICD-10-CM | POA: Insufficient documentation

## 2020-09-12 DIAGNOSIS — R2681 Unsteadiness on feet: Secondary | ICD-10-CM | POA: Insufficient documentation

## 2020-09-12 DIAGNOSIS — G4730 Sleep apnea, unspecified: Secondary | ICD-10-CM | POA: Insufficient documentation

## 2020-09-12 DIAGNOSIS — R7402 Elevation of levels of lactic acid dehydrogenase (LDH): Secondary | ICD-10-CM | POA: Diagnosis not present

## 2020-09-12 DIAGNOSIS — I4891 Unspecified atrial fibrillation: Secondary | ICD-10-CM | POA: Diagnosis not present

## 2020-09-12 DIAGNOSIS — R0609 Other forms of dyspnea: Secondary | ICD-10-CM | POA: Insufficient documentation

## 2020-09-12 DIAGNOSIS — N189 Chronic kidney disease, unspecified: Secondary | ICD-10-CM | POA: Insufficient documentation

## 2020-09-12 DIAGNOSIS — D539 Nutritional anemia, unspecified: Secondary | ICD-10-CM | POA: Diagnosis not present

## 2020-09-12 DIAGNOSIS — D649 Anemia, unspecified: Secondary | ICD-10-CM

## 2020-09-12 LAB — CBC WITH DIFFERENTIAL (CANCER CENTER ONLY)
Abs Immature Granulocytes: 0.03 10*3/uL (ref 0.00–0.07)
Basophils Absolute: 0 10*3/uL (ref 0.0–0.1)
Basophils Relative: 1 %
Eosinophils Absolute: 0.1 10*3/uL (ref 0.0–0.5)
Eosinophils Relative: 4 %
HCT: 36.3 % (ref 36.0–46.0)
Hemoglobin: 11.3 g/dL — ABNORMAL LOW (ref 12.0–15.0)
Immature Granulocytes: 1 %
Lymphocytes Relative: 22 %
Lymphs Abs: 0.8 10*3/uL (ref 0.7–4.0)
MCH: 31.7 pg (ref 26.0–34.0)
MCHC: 31.1 g/dL (ref 30.0–36.0)
MCV: 101.7 fL — ABNORMAL HIGH (ref 80.0–100.0)
Monocytes Absolute: 0.6 10*3/uL (ref 0.1–1.0)
Monocytes Relative: 17 %
Neutro Abs: 1.8 10*3/uL (ref 1.7–7.7)
Neutrophils Relative %: 55 %
Platelet Count: 168 10*3/uL (ref 150–400)
RBC: 3.57 MIL/uL — ABNORMAL LOW (ref 3.87–5.11)
RDW: 15.3 % (ref 11.5–15.5)
WBC Count: 3.4 10*3/uL — ABNORMAL LOW (ref 4.0–10.5)
nRBC: 0 % (ref 0.0–0.2)

## 2020-09-12 LAB — BASIC METABOLIC PANEL - CANCER CENTER ONLY
Anion gap: 8 (ref 5–15)
BUN: 16 mg/dL (ref 8–23)
CO2: 28 mmol/L (ref 22–32)
Calcium: 9.5 mg/dL (ref 8.9–10.3)
Chloride: 106 mmol/L (ref 98–111)
Creatinine: 1.2 mg/dL — ABNORMAL HIGH (ref 0.44–1.00)
GFR, Estimated: 48 mL/min — ABNORMAL LOW (ref >60)
Glucose, Bld: 104 mg/dL — ABNORMAL HIGH (ref 70–99)
Potassium: 4 mmol/L (ref 3.5–5.1)
Sodium: 142 mmol/L (ref 135–145)

## 2020-09-12 LAB — FIBRINOGEN: Fibrinogen: 346 mg/dL (ref 210–475)

## 2020-09-12 LAB — PROTIME-INR
INR: 1.2 (ref 0.8–1.2)
Prothrombin Time: 15.5 seconds — ABNORMAL HIGH (ref 11.4–15.2)

## 2020-09-12 LAB — RETICULOCYTES
Immature Retic Fract: 17.4 % — ABNORMAL HIGH (ref 2.3–15.9)
RBC.: 3.61 MIL/uL — ABNORMAL LOW (ref 3.87–5.11)
Retic Count, Absolute: 126.7 10*3/uL (ref 19.0–186.0)
Retic Ct Pct: 3.5 % — ABNORMAL HIGH (ref 0.4–3.1)

## 2020-09-12 LAB — LACTATE DEHYDROGENASE: LDH: 388 U/L — ABNORMAL HIGH (ref 98–192)

## 2020-09-12 NOTE — Telephone Encounter (Signed)
TC to Pt per Dr Benay Spice Informed Pt about improved HGB and renal function and to follow up as scheduled. Pt verbalized understanding.

## 2020-09-12 NOTE — Progress Notes (Signed)
  [place] OFFICE PROGRESS NOTE   Diagnosis: Anemia  INTERVAL HISTORY:   [name] [name] returns as scheduled she reports improvement in her energy level, dyspnea, and appetite over the past several weeks.  No bleeding.  No fever or night sweats.  No new complaint.  She is scheduled for an upper endoscopy and colonoscopy later this month.  Objective:  Vital signs in last 24 hours:  Blood pressure 136/63, pulse 68, temperature 98.7 F (37.1 C), temperature source Oral, resp. rate 20, height 5' 7" (1.702 m), weight 221 lb 9.6 oz (100.5 kg), SpO2 96 %.    Lymphatics: No cervical, supraclavicular, axillary, or inguinal nodes Resp: Lungs clear bilaterally Cardio: Regular rate and rhythm GI: No hepatosplenomegaly Vascular: No leg edema   Lab Results:  Lab Results  Component Value Date   WBC 3.7 (L) 08/26/2020   HGB 10.0 (L) 08/26/2020   HCT 30.9 (L) 08/26/2020   MCV 100.3 (H) 08/26/2020   PLT 181 08/26/2020   NEUTROABS 1.7 08/26/2020    CMP  Lab Results  Component Value Date   NA 137 07/31/2020   K 4.8 07/31/2020   CL 101 07/31/2020   CO2 24 07/31/2020   GLUCOSE 116 (H) 07/31/2020   BUN 15 07/31/2020   CREATININE 1.58 (H) 07/31/2020   CALCIUM 9.1 07/31/2020   PROT 6.1 07/31/2020   ALBUMIN 3.8 07/31/2020   AST 12 07/31/2020   ALT 5 07/31/2020   ALKPHOS 71 07/31/2020   BILITOT 0.9 07/31/2020   GFRNONAA 40 (L) 11/15/2017   GFRAA 46 (L) 11/15/2017    No results found for: CEA1, CEA, [id number], CA125  Lab Results  Component Value Date   INR 1.3 (H) 08/26/2020   LABPROT 16.2 (H) 08/26/2020     Medications: I have reviewed the patient's current medications.   Assessment/Plan: Macrocytic anemia/thrombocytopenia 08/13/2020 B12 3500 08/13/2020 LDH 612 08/13/2020 myeloma panel-no monoclonal protein  Bone marrow biopsy 08/20/2020-hypercellular marrow with erythroid hyperplasia and dyspoiesis, rare lipogranuloma like lesions.  Findings concerning for a  low-grade myelodysplastic syndrome.  Negative myeloma FISH panel, 40 6XX PNH screen - 08/26/2020 Fatigue Dyspnea on exertion Unsteady gait/balance disorder, followed by Dr. Tomi Likens CKD Atrial fibrillation Hypertension Sleep apnea    Disposition: [name] [name] was referred for evaluation of anemia.  The etiology of the anemia remains unclear.  It is possible the anemia is related to subclinical bleeding while on apixaban and chronic renal insufficiency.  A bone marrow biopsy 08/20/2020 suggests possible myelodysplasia.   There was some evidence of hemolysis with a low haptoglobin, elevated LDH, and mild elevation of the PT/INR.  A DAT panel and PNH screen were negative.  Her clinical status has improved.  It is possible the hematologic findings are related to a resolving infection.  We will repeat a CBC, LDH, and chemistry panel today.  She will return for an office visit and repeat CBC after the colonoscopy.  We will consider referring her for CT scans to look for evidence of lymphoma or another malignancy if the anemia persists. Betsy Coder, MD  09/12/2020  9:13 AM

## 2020-09-12 NOTE — Telephone Encounter (Signed)
-----   Message from Ladell Pier, MD sent at 09/12/2020  2:49 PM EDT ----- Please call patient, her hemoglobin and renal function appear improved, follow-up as scheduled

## 2020-09-13 LAB — HAPTOGLOBIN: Haptoglobin: 35 mg/dL — ABNORMAL LOW (ref 42–346)

## 2020-09-26 DIAGNOSIS — F419 Anxiety disorder, unspecified: Secondary | ICD-10-CM | POA: Diagnosis not present

## 2020-09-26 DIAGNOSIS — I129 Hypertensive chronic kidney disease with stage 1 through stage 4 chronic kidney disease, or unspecified chronic kidney disease: Secondary | ICD-10-CM | POA: Diagnosis not present

## 2020-09-26 DIAGNOSIS — N183 Chronic kidney disease, stage 3 unspecified: Secondary | ICD-10-CM | POA: Diagnosis not present

## 2020-09-26 DIAGNOSIS — F331 Major depressive disorder, recurrent, moderate: Secondary | ICD-10-CM | POA: Diagnosis not present

## 2020-09-26 DIAGNOSIS — R7303 Prediabetes: Secondary | ICD-10-CM | POA: Diagnosis not present

## 2020-09-26 DIAGNOSIS — E669 Obesity, unspecified: Secondary | ICD-10-CM | POA: Diagnosis not present

## 2020-09-26 DIAGNOSIS — E785 Hyperlipidemia, unspecified: Secondary | ICD-10-CM | POA: Diagnosis not present

## 2020-10-14 ENCOUNTER — Encounter: Payer: Self-pay | Admitting: Nurse Practitioner

## 2020-10-14 ENCOUNTER — Inpatient Hospital Stay: Payer: Medicare HMO | Admitting: Nurse Practitioner

## 2020-10-14 ENCOUNTER — Other Ambulatory Visit: Payer: Self-pay

## 2020-10-14 ENCOUNTER — Inpatient Hospital Stay: Payer: Medicare HMO | Attending: Oncology

## 2020-10-14 VITALS — BP 140/64 | HR 69 | Temp 98.7°F | Resp 18 | Ht 67.0 in | Wt 222.4 lb

## 2020-10-14 DIAGNOSIS — I129 Hypertensive chronic kidney disease with stage 1 through stage 4 chronic kidney disease, or unspecified chronic kidney disease: Secondary | ICD-10-CM | POA: Insufficient documentation

## 2020-10-14 DIAGNOSIS — R5383 Other fatigue: Secondary | ICD-10-CM | POA: Diagnosis not present

## 2020-10-14 DIAGNOSIS — R0609 Other forms of dyspnea: Secondary | ICD-10-CM | POA: Insufficient documentation

## 2020-10-14 DIAGNOSIS — G4730 Sleep apnea, unspecified: Secondary | ICD-10-CM | POA: Insufficient documentation

## 2020-10-14 DIAGNOSIS — D539 Nutritional anemia, unspecified: Secondary | ICD-10-CM | POA: Insufficient documentation

## 2020-10-14 DIAGNOSIS — N189 Chronic kidney disease, unspecified: Secondary | ICD-10-CM | POA: Diagnosis not present

## 2020-10-14 DIAGNOSIS — D696 Thrombocytopenia, unspecified: Secondary | ICD-10-CM | POA: Insufficient documentation

## 2020-10-14 DIAGNOSIS — Z79899 Other long term (current) drug therapy: Secondary | ICD-10-CM | POA: Insufficient documentation

## 2020-10-14 DIAGNOSIS — I4891 Unspecified atrial fibrillation: Secondary | ICD-10-CM | POA: Diagnosis not present

## 2020-10-14 DIAGNOSIS — D649 Anemia, unspecified: Secondary | ICD-10-CM

## 2020-10-14 DIAGNOSIS — R2681 Unsteadiness on feet: Secondary | ICD-10-CM | POA: Insufficient documentation

## 2020-10-14 LAB — CBC WITH DIFFERENTIAL (CANCER CENTER ONLY)
Abs Immature Granulocytes: 0.01 10*3/uL (ref 0.00–0.07)
Basophils Absolute: 0 10*3/uL (ref 0.0–0.1)
Basophils Relative: 1 %
Eosinophils Absolute: 0.1 10*3/uL (ref 0.0–0.5)
Eosinophils Relative: 3 %
HCT: 37.8 % (ref 36.0–46.0)
Hemoglobin: 11.8 g/dL — ABNORMAL LOW (ref 12.0–15.0)
Immature Granulocytes: 0 %
Lymphocytes Relative: 29 %
Lymphs Abs: 1.2 10*3/uL (ref 0.7–4.0)
MCH: 30.7 pg (ref 26.0–34.0)
MCHC: 31.2 g/dL (ref 30.0–36.0)
MCV: 98.4 fL (ref 80.0–100.0)
Monocytes Absolute: 1.1 10*3/uL — ABNORMAL HIGH (ref 0.1–1.0)
Monocytes Relative: 27 %
Neutro Abs: 1.6 10*3/uL — ABNORMAL LOW (ref 1.7–7.7)
Neutrophils Relative %: 40 %
Platelet Count: 124 10*3/uL — ABNORMAL LOW (ref 150–400)
RBC: 3.84 MIL/uL — ABNORMAL LOW (ref 3.87–5.11)
RDW: 13.5 % (ref 11.5–15.5)
WBC Count: 4.1 10*3/uL (ref 4.0–10.5)
nRBC: 0 % (ref 0.0–0.2)

## 2020-10-14 NOTE — Progress Notes (Signed)
  [place] OFFICE PROGRESS NOTE   Diagnosis:  Anemia  INTERVAL HISTORY:   [name] [name] returns as scheduled.  She has mild dyspnea on exertion, improved as compared to a few months ago.  No cough or fever.  She denies bleeding.  No change in bowel habits.  No nausea.  Appetite is "coming along".  She thinks her weight is stable.  She was scheduled for an upper endoscopy and colonoscopy on 10/07/2020.  She canceled due to her husband being in the hospital.  Objective:  Vital signs in last 24 hours:  Blood pressure 140/64, pulse 69, temperature 98.7 F (37.1 C), temperature source Oral, resp. rate 18, height _0  (1.702 m), weight 222 lb 6.4 oz (100.9 kg), SpO2 97 %.    HEENT: No thrush or ulcers. Lymphatics: No palpable cervical, supraclavicular, axillary or inguinal lymph nodes. Resp: Lungs clear bilaterally. Cardio: Regular rate and rhythm. GI: Abdomen soft and nontender.  No hepatosplenomegaly. Vascular: No leg edema.   Lab Results:  Lab Results  Component Value Date   WBC 4.1 10/14/2020   HGB 11.8 (L) 10/14/2020   HCT 37.8 10/14/2020   MCV 98.4 10/14/2020   PLT 124 (L) 10/14/2020   NEUTROABS PENDING 10/14/2020    Imaging:  No results found.  Medications: I have reviewed the patient's current medications.  Assessment/Plan: Macrocytic anemia/thrombocytopenia 08/13/2020 B12 3500 08/13/2020 LDH 612 08/13/2020 myeloma panel-no monoclonal protein  Bone marrow biopsy 08/20/2020-hypercellular marrow with erythroid hyperplasia and dyspoiesis, rare lipogranuloma like lesions.  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  Negative myeloma FISH panel, 40 6XX PNH screen - 08/26/2020 Fatigue Dyspnea on exertion Unsteady gait/balance disorder, followed by Dr. Tomi Likens CKD Atrial fibrillation Hypertension Sleep apnea  Disposition: Ms. [name] appears stable.  We reviewed the preliminary CBC result from today.  Hemoglobin continues to improve, 11.8  today.  White count is in normal range, differential is pending.  She has mild thrombocytopenia.  We will follow-up on the final CBC result.  She will return for a CBC and follow-up visit in 6 weeks.  Sooner if needed.  She understands to contact the office with any bleeding.  I encouraged her to reschedule the EGD/colonoscopy.    Ned Card ANP/GNP-BC   10/14/2020  9:40 AM

## 2020-10-22 ENCOUNTER — Inpatient Hospital Stay: Payer: Medicare HMO

## 2020-10-22 ENCOUNTER — Ambulatory Visit: Payer: Medicare HMO | Admitting: Oncology

## 2020-10-25 ENCOUNTER — Telehealth: Payer: Self-pay | Admitting: *Deleted

## 2020-10-25 NOTE — Telephone Encounter (Signed)
   Pre-operative Risk Assessment    Patient Name: [name] [name]  DOB: 1945/12/20 MRN: [id number]      Request for Surgical Clearance   Procedure:   COLONOSCOPY/ENDOCSCOPY  Date of Surgery: Clearance 02/04/21                                 Surgeon:  DR. Michail Sermon Surgeon's Group or Practice Name:  [facility] Phone number:  [phone] Fax number:  [phone]   Type of Clearance Requested: - Pharmacy:  Hold Apixaban (Eliquis)     Type of Anesthesia:   PROPOFOL   Additional requests/questions:   Jiles Prows   10/25/2020, 6:09 PM

## 2020-10-28 NOTE — Telephone Encounter (Signed)
Clinical pharmacist to review Eliquis 

## 2020-10-28 NOTE — Telephone Encounter (Signed)
   Name: [name] [name]  DOB: Oct 27, 1945  MRN: [id number]   Primary Cardiologist: Candee Furbish, MD  Chart reviewed as part of pre-operative protocol coverage. Patient was contacted 10/28/2020 in reference to pre-operative risk assessment for pending surgery as outlined below.  [name] [name] was last seen on 05/30/2020 by Dr. Marlou Porch.  Since that day, [name] [name] has done well without chest pain or worsening dyspnea.   Therefore, based on ACC/AHA guidelines, the patient would be at acceptable risk for the planned procedure without further cardiovascular testing.   See below at recommendation by our clinical pharmacist  The patient was advised that if she develops new symptoms prior to surgery to contact our office to arrange for a follow-up visit, and she verbalized understanding.  I will route this recommendation to the requesting party via Epic fax function and remove from pre-op pool. Please call with questions.  [place], [place] 10/28/2020, 4:08 PM

## 2020-10-28 NOTE — Telephone Encounter (Signed)
Patient with diagnosis of afib on Eliquis for anticoagulation.    Procedure: colonoscopy/endoscopy Date of procedure: 02/04/21  CHA2DS2-VASc Score = 4  This indicates a 4.8% annual risk of stroke. The patient's score is based upon: CHF History: 1 HTN History: 1 Diabetes History: 0 Stroke History: 0 Vascular Disease History: 0 Age Score: 1 Gender Score: 1   CrCl 63mL/min using adjusted body weight Platelet count 181K  Per office protocol, patient can hold Eliquis for 1-2 days prior to procedure.

## 2020-11-02 ENCOUNTER — Other Ambulatory Visit: Payer: Self-pay | Admitting: Cardiology

## 2020-11-04 NOTE — Telephone Encounter (Signed)
Eliquis 5 mg refill request received. Patient is 75 years old, weight-100.9 kg, Crea- 1.2 on 09/12/20, Diagnosis-afib, and last seen by Dr. Marlou Porch on 05/30/20. Dose is appropriate based on dosing criteria. Will send in refill to requested pharmacy.

## 2020-11-08 ENCOUNTER — Other Ambulatory Visit: Payer: Self-pay | Admitting: Family Medicine

## 2020-11-08 DIAGNOSIS — Z1231 Encounter for screening mammogram for malignant neoplasm of breast: Secondary | ICD-10-CM

## 2020-11-18 DIAGNOSIS — H52203 Unspecified astigmatism, bilateral: Secondary | ICD-10-CM | POA: Diagnosis not present

## 2020-11-18 DIAGNOSIS — H524 Presbyopia: Secondary | ICD-10-CM | POA: Diagnosis not present

## 2020-11-18 DIAGNOSIS — H25813 Combined forms of age-related cataract, bilateral: Secondary | ICD-10-CM | POA: Diagnosis not present

## 2020-11-18 DIAGNOSIS — H5213 Myopia, bilateral: Secondary | ICD-10-CM | POA: Diagnosis not present

## 2020-11-25 ENCOUNTER — Inpatient Hospital Stay: Payer: Medicare HMO | Attending: Oncology

## 2020-11-25 ENCOUNTER — Other Ambulatory Visit: Payer: Self-pay

## 2020-11-25 ENCOUNTER — Inpatient Hospital Stay: Payer: Medicare HMO | Admitting: Oncology

## 2020-11-25 VITALS — BP 132/56 | HR 71 | Temp 98.1°F | Resp 20 | Ht 67.0 in | Wt 217.0 lb

## 2020-11-25 DIAGNOSIS — N189 Chronic kidney disease, unspecified: Secondary | ICD-10-CM | POA: Diagnosis not present

## 2020-11-25 DIAGNOSIS — D539 Nutritional anemia, unspecified: Secondary | ICD-10-CM | POA: Insufficient documentation

## 2020-11-25 DIAGNOSIS — D649 Anemia, unspecified: Secondary | ICD-10-CM

## 2020-11-25 DIAGNOSIS — I4891 Unspecified atrial fibrillation: Secondary | ICD-10-CM | POA: Insufficient documentation

## 2020-11-25 DIAGNOSIS — G4730 Sleep apnea, unspecified: Secondary | ICD-10-CM | POA: Diagnosis not present

## 2020-11-25 DIAGNOSIS — D696 Thrombocytopenia, unspecified: Secondary | ICD-10-CM | POA: Diagnosis not present

## 2020-11-25 DIAGNOSIS — Z79899 Other long term (current) drug therapy: Secondary | ICD-10-CM | POA: Diagnosis not present

## 2020-11-25 DIAGNOSIS — R0609 Other forms of dyspnea: Secondary | ICD-10-CM | POA: Diagnosis not present

## 2020-11-25 DIAGNOSIS — R2681 Unsteadiness on feet: Secondary | ICD-10-CM | POA: Diagnosis not present

## 2020-11-25 DIAGNOSIS — I129 Hypertensive chronic kidney disease with stage 1 through stage 4 chronic kidney disease, or unspecified chronic kidney disease: Secondary | ICD-10-CM | POA: Diagnosis not present

## 2020-11-25 DIAGNOSIS — R5383 Other fatigue: Secondary | ICD-10-CM | POA: Diagnosis not present

## 2020-11-25 LAB — CBC WITH DIFFERENTIAL (CANCER CENTER ONLY)
Abs Immature Granulocytes: 0.01 10*3/uL (ref 0.00–0.07)
Basophils Absolute: 0.1 10*3/uL (ref 0.0–0.1)
Basophils Relative: 1 %
Eosinophils Absolute: 0.2 10*3/uL (ref 0.0–0.5)
Eosinophils Relative: 4 %
HCT: 33.7 % — ABNORMAL LOW (ref 36.0–46.0)
Hemoglobin: 10.8 g/dL — ABNORMAL LOW (ref 12.0–15.0)
Immature Granulocytes: 0 %
Lymphocytes Relative: 30 %
Lymphs Abs: 1.2 10*3/uL (ref 0.7–4.0)
MCH: 31.7 pg (ref 26.0–34.0)
MCHC: 32 g/dL (ref 30.0–36.0)
MCV: 98.8 fL (ref 80.0–100.0)
Monocytes Absolute: 0.7 10*3/uL (ref 0.1–1.0)
Monocytes Relative: 19 %
Neutro Abs: 1.8 10*3/uL (ref 1.7–7.7)
Neutrophils Relative %: 46 %
Platelet Count: 148 10*3/uL — ABNORMAL LOW (ref 150–400)
RBC: 3.41 MIL/uL — ABNORMAL LOW (ref 3.87–5.11)
RDW: 20.3 % — ABNORMAL HIGH (ref 11.5–15.5)
WBC Count: 3.9 10*3/uL — ABNORMAL LOW (ref 4.0–10.5)
nRBC: 0 % (ref 0.0–0.2)

## 2020-11-25 NOTE — Progress Notes (Signed)
  [place] OFFICE PROGRESS NOTE   Diagnosis: Anemia, thrombocytopenia  INTERVAL HISTORY:   [name] returns as scheduled.  No bleeding.  Good appetite and energy level.  No fever or night sweats.  Objective:  Vital signs in last 24 hours:  Blood pressure (!) 132/56, pulse 71, temperature 98.1 F (36.7 C), temperature source Oral, resp. rate 20, height _0  (1.702 m), weight 217 lb (98.4 kg), SpO2 100 %.    Lymphatics: No cervical, supraclavicular, or axillary nodes Resp: Lungs clear bilaterally Cardio: Regular rate and rhythm GI: No hepatosplenomegaly Vascular: No leg edema   Lab Results:  Lab Results  Component Value Date   WBC 3.9 (L) 11/25/2020   HGB 10.8 (L) 11/25/2020   HCT 33.7 (L) 11/25/2020   MCV 98.8 11/25/2020   PLT 148 (L) 11/25/2020   NEUTROABS 1.8 11/25/2020    CMP  Lab Results  Component Value Date   NA 142 09/12/2020   K 4.0 09/12/2020   CL 106 09/12/2020   CO2 28 09/12/2020   GLUCOSE 104 (H) 09/12/2020   BUN 16 09/12/2020   CREATININE 1.20 (H) 09/12/2020   CALCIUM 9.5 09/12/2020   PROT 6.1 07/31/2020   ALBUMIN 3.8 07/31/2020   AST 12 07/31/2020   ALT 5 07/31/2020   ALKPHOS 71 07/31/2020   BILITOT 0.9 07/31/2020   GFRNONAA 48 (L) 09/12/2020   GFRAA 46 (L) 11/15/2017    Medications: I have reviewed the patient's current medications.   Assessment/Plan: Macrocytic anemia/thrombocytopenia 08/13/2020 B12 3500 08/13/2020 LDH 612 08/13/2020 myeloma panel-no monoclonal protein  Bone marrow biopsy 08/20/2020-hypercellular marrow with erythroid hyperplasia and dyspoiesis, rare lipogranuloma like lesions.  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  Negative myeloma FISH panel, 46XX PNH screen - 08/26/2020 Fatigue Dyspnea on exertion Unsteady gait/balance disorder, followed by Dr. Tomi Likens CKD Atrial fibrillation Hypertension Sleep apnea    Disposition: [name] appears stable.  She has mild anemia and  thrombocytopenia, most likely related to myelodysplasia.  She appears asymptomatic from the anemia.  She will return for an office and lab visit in 2 months.  She will call for new symptoms.  Betsy Coder, MD  11/25/2020  8:16 AM

## 2020-12-13 ENCOUNTER — Ambulatory Visit: Payer: Medicare HMO

## 2021-01-14 ENCOUNTER — Ambulatory Visit: Payer: Medicare HMO | Admitting: Endocrinology

## 2021-01-14 ENCOUNTER — Encounter: Payer: Self-pay | Admitting: Cardiology

## 2021-01-15 ENCOUNTER — Other Ambulatory Visit: Payer: Self-pay | Admitting: Endocrinology

## 2021-01-15 ENCOUNTER — Ambulatory Visit
Admission: RE | Admit: 2021-01-15 | Discharge: 2021-01-15 | Disposition: A | Discharge status: Home / Self Care | Payer: Medicare HMO | Source: Ambulatory Visit | Attending: Family Medicine | Admitting: Family Medicine

## 2021-01-15 DIAGNOSIS — E032 Hypothyroidism due to medicaments and other exogenous substances: Secondary | ICD-10-CM

## 2021-01-15 DIAGNOSIS — Z1231 Encounter for screening mammogram for malignant neoplasm of breast: Secondary | ICD-10-CM

## 2021-01-16 ENCOUNTER — Ambulatory Visit (INDEPENDENT_AMBULATORY_CARE_PROVIDER_SITE_OTHER): Payer: Medicare HMO | Admitting: Endocrinology

## 2021-01-16 ENCOUNTER — Other Ambulatory Visit: Payer: Self-pay

## 2021-01-16 VITALS — BP 140/62 | HR 76 | Ht 67.0 in | Wt 208.0 lb

## 2021-01-16 DIAGNOSIS — E032 Hypothyroidism due to medicaments and other exogenous substances: Secondary | ICD-10-CM

## 2021-01-16 LAB — TSH: TSH: 0.25 u[IU]/mL — ABNORMAL LOW (ref 0.35–5.50)

## 2021-01-16 LAB — T4, FREE: Free T4: 1.48 ng/dL (ref 0.60–1.60)

## 2021-01-16 NOTE — Progress Notes (Signed)
Subjective:    Patient ID: [name] [name], female    DOB: 1945/12/21, 76 y.o.   MRN: [id number]  HPI Pt returns for f/u of hypothyroidism (she took amiodarone 2018-2022; hypothyroidism was dx'ed in late 2019, when she was rx'ed synthroid; she has never had thyroid imaging).  She takes synthroid as rx'ed.  pt states she feels well in general, except for fatigue.   Past Medical History:  Diagnosis Date   Anxiety    Arthritis    Cataracts, bilateral    immature   CKD (chronic kidney disease) stage 3, GFR 30-59 ml/min (HCC)    Depression    GERD (gastroesophageal reflux disease)    was on Nexium    GERD (gastroesophageal reflux disease)    Hemorrhoid    History of bronchitis    couple of yrs ago   History of colon polyps    History of peristent atrial fibrillation    Hyperlipidemia    takes Pravastatin daily   Hypertension    takes Amlodipine and Diovan daily   Hypertension    Pre-diabetes    Sleep apnea    no cpap used   Sleep apnea    study done 20+yrs ago and doesn't use cpap   Urinary urgency    Weakness    in left leg r/t back    Past Surgical History:  Procedure Laterality Date   ABDOMINAL HYSTERECTOMY  1980   partial   ABDOMINAL HYSTERECTOMY     partial   BACK SURGERY     CARDIOVERSION N/A 02/24/2016   Procedure: CARDIOVERSION;  Surgeon: Fay Records, MD;  Location: [place];  Service: Cardiovascular;  Laterality: N/A;   CARDIOVERSION N/A 03/19/2016   Procedure: CARDIOVERSION;  Surgeon: Fay Records, MD;  Location: [place];  Service: Cardiovascular;  Laterality: N/A;   [place]     COLONOSCOPY     COLONOSCOPY WITH PROPOFOL  12/29/2011   Procedure: COLONOSCOPY WITH PROPOFOL;  Surgeon: Lear Ng, MD;  Location: [facility];  Service: Endoscopy;  Laterality: N/A;   COLONOSCOPY WITH PROPOFOL N/A 05/24/2014   Procedure: COLONOSCOPY WITH PROPOFOL;  Surgeon: Wilford Corner, MD;  Location: [facility];  Service: Endoscopy;  Laterality: N/A;   ESOPHAGEAL MANOMETRY N/A 02/22/2017   Procedure: ESOPHAGEAL MANOMETRY (EM);  Surgeon: Wilford Corner, MD;  Location: [facility];  Service: Endoscopy;  Laterality: N/A;   ESOPHAGOGASTRODUODENOSCOPY     FOOT SURGERY     left, right   FRACTURE SURGERY     left leg-knee   HEEL SPUR EXCISION Bilateral    HOT HEMOSTASIS  12/29/2011   Procedure: HOT HEMOSTASIS (ARGON PLASMA COAGULATION/BICAP);  Surgeon: Lear Ng, MD;  Location: [facility];  Service: Endoscopy;  Laterality: N/A;   HOT HEMOSTASIS N/A 05/24/2014   Procedure: HOT HEMOSTASIS (ARGON PLASMA COAGULATION/BICAP);  Surgeon: Wilford Corner, MD;  Location: [facility];  Service: Endoscopy;  Laterality: N/A;   leg surgery d/t break Left    LUMBAR LAMINECTOMY/DECOMPRESSION MICRODISCECTOMY Left 04/05/2013   Procedure: LUMBAR FOUR TO FIVE LUMBAR LAMINECTOMY/DECOMPRESSION MICRODISCECTOMY 1 LEVEL;  Surgeon: Eustace Moore, MD;  Location: [place] [facility];  Service: Neurosurgery;  Laterality: Left;   NISSEN FUNDOPLICATION     [place] IMPEDANCE STUDY N/A 02/22/2017   Procedure: [place] IMPEDANCE STUDY;  Surgeon: Wilford Corner, MD;  Location: [facility];  Service: Endoscopy;  Laterality: N/A;   TEE WITHOUT CARDIOVERSION N/A 02/24/2016   Procedure: TRANSESOPHAGEAL  ECHOCARDIOGRAM (TEE);  Surgeon: Fay Records, MD;  Location: [place];  Service: Cardiovascular;  Laterality: N/A;   TOTAL KNEE ARTHROPLASTY Left 10/07/2016   TOTAL KNEE ARTHROPLASTY Left 10/07/2016   Procedure: TOTAL KNEE ARTHROPLASTY;  Surgeon: [name], MD;  Location: [place];  Service: Orthopedics;  Laterality: Left;    Social History   Socioeconomic History   Marital status: Married    Spouse name: Dominica Severin   Number of children: 2   Years of education: 12   Highest education level: Some college, no degree  Occupational History   Occupation: retired  Tobacco Use   Smoking status: Never   Smokeless tobacco: Never   Vaping Use   Vaping Use: Never used  Substance and Sexual Activity   Alcohol use: No   Drug use: No   Sexual activity: Yes    Birth control/protection: Surgical  Other Topics Concern   Not on file  Social History Narrative   Right handed   Drinks caffeine   Two story home   Social Determinants of Health   Financial Resource Strain: Not on file  Food Insecurity: Not on file  Transportation Needs: Not on file  Physical Activity: Not on file  Stress: Not on file  Social Connections: Not on file  Intimate Partner Violence: Not on file    Current Outpatient Medications on File Prior to Visit  Medication Sig Dispense Refill   albuterol (VENTOLIN HFA) 108 (90 Base) MCG/ACT inhaler Inhale 1-2 puffs into the lungs every 4 (four) hours as needed for wheezing or shortness of breath. 1 g 1   amLODipine (NORVASC) 5 MG tablet Take 1 tablet (5 mg total) by mouth daily. 90 tablet 3   Ascorbic Acid (VITAMIN C WITH ROSE HIPS) 1000 MG tablet Take 1,000 mg by mouth daily.     benzonatate (TESSALON) 200 MG capsule Take 1 capsule by mouth three times daily as needed for cough 60 capsule 0   buPROPion (WELLBUTRIN SR) 200 MG 12 hr tablet Take 400 mg by mouth daily.     busPIRone (BUSPAR) 10 MG tablet Take 10 mg by mouth 2 (two) times daily.     CALCIUM PO Take 1 tablet by mouth every morning.     Cholecalciferol (VITAMIN D) 2000 UNITS tablet Take 4,000 Units by mouth daily.     [employer] MELATONIN PO Take 1 tablet by mouth at bedtime as needed.     ELIQUIS 5 MG TABS tablet TAKE 1 TABLET TWICE DAILY 180 tablet 1   fexofenadine (ALLEGRA) 180 MG tablet Take 180 mg by mouth daily as needed for allergies or rhinitis.     furosemide (LASIX) 20 MG tablet TAKE 1 TABLET EVERY DAY 90 tablet 3   levothyroxine (SYNTHROID) 75 MCG tablet TAKE 1 TABLET DAILY BEFORE BREAKFAST (NEED APPOINTMENT FOR FURTHER REFILLS) 90 tablet 0   metoprolol succinate (TOPROL-XL) 25 MG 24 hr tablet TAKE 1/2 TABLET ONE TIME DAILY 45 tablet  3   potassium chloride SA (KLOR-CON) 20 MEQ tablet Take 1 tablet (20 mEq total) by mouth daily. 90 tablet 2   pravastatin (PRAVACHOL) 40 MG tablet Take 40 mg by mouth at bedtime.     Spacer/Aero-Holding Chambers (AEROCHAMBER MV) inhaler Use as instructed 1 each 0   Tiotropium Bromide Monohydrate (SPIRIVA RESPIMAT) 2.5 MCG/ACT AERS Inhale 2 puffs into the lungs daily. 2 each 0   vitamin B-12 (CYANOCOBALAMIN) 250 MCG tablet Take 250 mcg by mouth daily.     No current facility-administered medications  on file prior to visit.    Allergies  Allergen Reactions   Omnicef [Cefdinir] Swelling    Tongue swelling   Lexapro [Escitalopram]     Makes her feel funny   Penicillins     Pt cannot remember reaction she had as hadn't been on it in about 20 years but stated that she knows she is allergic to it.   Peroxide [Hydrogen Peroxide] Other (See Comments)    Redness.    Zoloft [Sertraline Hcl]     Makes her feel funny   Neosporin [Neomycin-Bacitracin Zn-Polymyx] Rash    Blisters, itching.     Family History  Problem Relation Age of Onset   Colon cancer Mother    Heart disease Father    Breast cancer Neg Hx    Thyroid disease Neg Hx     BP 140/62    Pulse 76    Ht 5\' 7"  (1.702 m)    Wt 208 lb (94.3 kg)    SpO2 95%    BMI 32.58 kg/m    Review of Systems She has lost 27 lbs since last ov here.      Objective:   Physical Exam VITAL SIGNS:  See vs page GENERAL: no distress NECK: There is no palpable thyroid enlargement.  No thyroid nodule is palpable.  No palpable lymphadenopathy at the anterior neck.     Lab Results  Component Value Date   TSH 0.25 (L) 01/16/2021       Assessment & Plan:  Hyperthyroidism: overcontrolled.  I offered pt the option of decreasing synthroid vs continuing the same.

## 2021-01-16 NOTE — Patient Instructions (Addendum)
Blood tests are requested for you today.  We'll let you know about the results.  It is best to never miss the medication.  However, if you do miss it, next best is to double up the next time. Please come back for a follow-up appointment in 3 months.       

## 2021-01-17 ENCOUNTER — Inpatient Hospital Stay: Payer: Medicare HMO | Attending: Oncology

## 2021-01-17 ENCOUNTER — Inpatient Hospital Stay: Payer: Medicare HMO | Admitting: Nurse Practitioner

## 2021-01-17 ENCOUNTER — Encounter: Payer: Self-pay | Admitting: Nurse Practitioner

## 2021-01-17 VITALS — BP 125/58 | HR 95 | Temp 98.1°F | Resp 20 | Ht 67.0 in | Wt 208.2 lb

## 2021-01-17 DIAGNOSIS — R2681 Unsteadiness on feet: Secondary | ICD-10-CM | POA: Diagnosis not present

## 2021-01-17 DIAGNOSIS — R5381 Other malaise: Secondary | ICD-10-CM | POA: Diagnosis not present

## 2021-01-17 DIAGNOSIS — D649 Anemia, unspecified: Secondary | ICD-10-CM | POA: Diagnosis not present

## 2021-01-17 DIAGNOSIS — N189 Chronic kidney disease, unspecified: Secondary | ICD-10-CM | POA: Diagnosis not present

## 2021-01-17 DIAGNOSIS — R0609 Other forms of dyspnea: Secondary | ICD-10-CM | POA: Insufficient documentation

## 2021-01-17 DIAGNOSIS — R630 Anorexia: Secondary | ICD-10-CM | POA: Insufficient documentation

## 2021-01-17 DIAGNOSIS — I129 Hypertensive chronic kidney disease with stage 1 through stage 4 chronic kidney disease, or unspecified chronic kidney disease: Secondary | ICD-10-CM | POA: Insufficient documentation

## 2021-01-17 DIAGNOSIS — G4730 Sleep apnea, unspecified: Secondary | ICD-10-CM | POA: Diagnosis not present

## 2021-01-17 DIAGNOSIS — D539 Nutritional anemia, unspecified: Secondary | ICD-10-CM | POA: Diagnosis not present

## 2021-01-17 DIAGNOSIS — D696 Thrombocytopenia, unspecified: Secondary | ICD-10-CM | POA: Insufficient documentation

## 2021-01-17 DIAGNOSIS — R634 Abnormal weight loss: Secondary | ICD-10-CM | POA: Insufficient documentation

## 2021-01-17 DIAGNOSIS — Z79899 Other long term (current) drug therapy: Secondary | ICD-10-CM | POA: Insufficient documentation

## 2021-01-17 DIAGNOSIS — D61818 Other pancytopenia: Secondary | ICD-10-CM | POA: Insufficient documentation

## 2021-01-17 DIAGNOSIS — I4891 Unspecified atrial fibrillation: Secondary | ICD-10-CM | POA: Diagnosis not present

## 2021-01-17 DIAGNOSIS — R5383 Other fatigue: Secondary | ICD-10-CM | POA: Diagnosis not present

## 2021-01-17 LAB — CBC WITH DIFFERENTIAL (CANCER CENTER ONLY)
Abs Immature Granulocytes: 0.01 10*3/uL (ref 0.00–0.07)
Basophils Absolute: 0 10*3/uL (ref 0.0–0.1)
Basophils Relative: 1 %
Eosinophils Absolute: 0.1 10*3/uL (ref 0.0–0.5)
Eosinophils Relative: 2 %
HCT: 36.4 % (ref 36.0–46.0)
Hemoglobin: 11.4 g/dL — ABNORMAL LOW (ref 12.0–15.0)
Immature Granulocytes: 0 %
Lymphocytes Relative: 39 %
Lymphs Abs: 1.2 10*3/uL (ref 0.7–4.0)
MCH: 31.2 pg (ref 26.0–34.0)
MCHC: 31.3 g/dL (ref 30.0–36.0)
MCV: 99.7 fL (ref 80.0–100.0)
Monocytes Absolute: 0.8 10*3/uL (ref 0.1–1.0)
Monocytes Relative: 26 %
Neutro Abs: 0.9 10*3/uL — ABNORMAL LOW (ref 1.7–7.7)
Neutrophils Relative %: 32 %
Platelet Count: 98 10*3/uL — ABNORMAL LOW (ref 150–400)
RBC: 3.65 MIL/uL — ABNORMAL LOW (ref 3.87–5.11)
RDW: 15.3 % (ref 11.5–15.5)
WBC Count: 3 10*3/uL — ABNORMAL LOW (ref 4.0–10.5)
nRBC: 0 % (ref 0.0–0.2)

## 2021-01-17 LAB — LACTATE DEHYDROGENASE: LDH: 935 U/L — ABNORMAL HIGH (ref 98–192)

## 2021-01-17 NOTE — Progress Notes (Signed)
[name] [name] OFFICE PROGRESS NOTE   Diagnosis: Anemia, thrombocytopenia  INTERVAL HISTORY:   [name] [name] returns as scheduled.  She reports "cold" symptoms for about 3 weeks, mainly cough and mucus production.  Symptoms are improving.  No significant dyspnea.  No fever or chills.  She continues to tire easily.  Appetite is poor.  She is losing weight.  Some sweats at nighttime.  Objective:  Vital signs in last 24 hours:  Blood pressure (!) 125/58, pulse 95, temperature 98.1 F (36.7 C), temperature source Oral, resp. rate 20, height _0  (1.702 m), weight 208 lb 3.2 oz (94.4 kg), SpO2 100 %.    HEENT: No thrush or ulcers. Lymphatics: No palpable cervical, supraclavicular, axillary or inguinal lymph nodes. Resp: Lungs clear bilaterally. Cardio: Regular rate and rhythm. GI: No hepatosplenomegaly. Vascular: No leg edema.    Lab Results:  Lab Results  Component Value Date   WBC 3.0 (L) 01/17/2021   HGB 11.4 (L) 01/17/2021   HCT 36.4 01/17/2021   MCV 99.7 01/17/2021   PLT 98 (L) 01/17/2021   NEUTROABS 0.9 (L) 01/17/2021    Imaging:  No results found.  Medications: I have reviewed the patient's current medications.  Assessment/Plan: Macrocytic anemia/thrombocytopenia 08/13/2020 B12 3500 08/13/2020 LDH 612 08/13/2020 myeloma panel-no monoclonal protein  Bone marrow biopsy 08/20/2020-hypercellular marrow with erythroid hyperplasia and dyspoiesis, rare lipogranuloma like lesions.  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  Negative myeloma FISH panel, 46XX [facility] screen - 08/26/2020 Fatigue Dyspnea on exertion Unsteady gait/balance disorder, followed by Dr. Tomi Likens CKD Atrial fibrillation Hypertension Sleep apnea  Disposition: [name] [name] has persistent mild anemia, mildly progressive thrombocytopenia and more recent neutropenia.  LDH is higher.  She is having periodic sweats, anorexia/weight loss.  The bone marrow findings from August were  suggestive of myelodysplasia.  Dr. Benay Spice recommends a referral to the leukemia service at [facility].  [name] [name] agrees with this plan.  Neutropenic and thrombocytopenic precautions were reviewed.  She will return for lab and follow-up in approximately 3 weeks.  We are available to see her sooner if needed.  Patient seen with Dr. Benay Spice.  Ned Card ANP/GNP-BC   01/17/2021  10:10 AM  This was a shared visit with Ned Card.  [name] [name] was interviewed and examined.  She has progressive pancytopenia, and elevated LDH, and "B "symptoms.  I am concerned she is developing a progressive myelodysplastic/myeloproliferative disorder.  We will make referral to [facility] to get the opinion of the leukemia service there.  I was present for greater than 50% of today's visit.  I performed medical decision making.  [name], MD

## 2021-01-21 ENCOUNTER — Encounter: Payer: Self-pay | Admitting: *Deleted

## 2021-01-21 NOTE — Progress Notes (Signed)
Faxed referral order, demographics and chart information w/all pathology to [facility] requesting "urgent" referral to Dr. Jerrye Noble or colleague to [phone].

## 2021-01-22 DIAGNOSIS — D709 Neutropenia, unspecified: Secondary | ICD-10-CM | POA: Diagnosis not present

## 2021-01-22 DIAGNOSIS — D469 Myelodysplastic syndrome, unspecified: Secondary | ICD-10-CM | POA: Diagnosis not present

## 2021-01-22 DIAGNOSIS — R7402 Elevation of levels of lactic acid dehydrogenase (LDH): Secondary | ICD-10-CM | POA: Diagnosis not present

## 2021-01-22 DIAGNOSIS — D649 Anemia, unspecified: Secondary | ICD-10-CM | POA: Diagnosis not present

## 2021-01-24 DIAGNOSIS — D696 Thrombocytopenia, unspecified: Secondary | ICD-10-CM | POA: Diagnosis not present

## 2021-01-27 ENCOUNTER — Ambulatory Visit: Payer: Medicare HMO | Admitting: Nurse Practitioner

## 2021-01-27 ENCOUNTER — Other Ambulatory Visit: Payer: Medicare HMO

## 2021-01-28 ENCOUNTER — Other Ambulatory Visit: Payer: Self-pay | Admitting: Cardiology

## 2021-02-04 DIAGNOSIS — Z86010 Personal history of colonic polyps: Secondary | ICD-10-CM | POA: Diagnosis not present

## 2021-02-04 DIAGNOSIS — K2970 Gastritis, unspecified, without bleeding: Secondary | ICD-10-CM | POA: Diagnosis not present

## 2021-02-04 DIAGNOSIS — R634 Abnormal weight loss: Secondary | ICD-10-CM | POA: Diagnosis not present

## 2021-02-04 DIAGNOSIS — K319 Disease of stomach and duodenum, unspecified: Secondary | ICD-10-CM | POA: Diagnosis not present

## 2021-02-04 DIAGNOSIS — D509 Iron deficiency anemia, unspecified: Secondary | ICD-10-CM | POA: Diagnosis not present

## 2021-02-04 DIAGNOSIS — D122 Benign neoplasm of ascending colon: Secondary | ICD-10-CM | POA: Diagnosis not present

## 2021-02-04 DIAGNOSIS — D123 Benign neoplasm of transverse colon: Secondary | ICD-10-CM | POA: Diagnosis not present

## 2021-02-04 DIAGNOSIS — K5730 Diverticulosis of large intestine without perforation or abscess without bleeding: Secondary | ICD-10-CM | POA: Diagnosis not present

## 2021-02-04 DIAGNOSIS — K648 Other hemorrhoids: Secondary | ICD-10-CM | POA: Diagnosis not present

## 2021-02-10 ENCOUNTER — Inpatient Hospital Stay: Payer: Medicare HMO

## 2021-02-10 ENCOUNTER — Other Ambulatory Visit: Payer: Self-pay

## 2021-02-10 ENCOUNTER — Telehealth: Payer: Self-pay

## 2021-02-10 ENCOUNTER — Inpatient Hospital Stay: Payer: Medicare HMO | Admitting: Oncology

## 2021-02-10 VITALS — BP 128/48 | HR 70 | Temp 97.8°F | Resp 20 | Ht 67.0 in | Wt 205.6 lb

## 2021-02-10 DIAGNOSIS — R0609 Other forms of dyspnea: Secondary | ICD-10-CM | POA: Diagnosis not present

## 2021-02-10 DIAGNOSIS — R634 Abnormal weight loss: Secondary | ICD-10-CM | POA: Diagnosis not present

## 2021-02-10 DIAGNOSIS — R5383 Other fatigue: Secondary | ICD-10-CM | POA: Diagnosis not present

## 2021-02-10 DIAGNOSIS — D696 Thrombocytopenia, unspecified: Secondary | ICD-10-CM | POA: Diagnosis not present

## 2021-02-10 DIAGNOSIS — D123 Benign neoplasm of transverse colon: Secondary | ICD-10-CM | POA: Diagnosis not present

## 2021-02-10 DIAGNOSIS — D649 Anemia, unspecified: Secondary | ICD-10-CM | POA: Diagnosis not present

## 2021-02-10 DIAGNOSIS — R630 Anorexia: Secondary | ICD-10-CM | POA: Diagnosis not present

## 2021-02-10 DIAGNOSIS — D61818 Other pancytopenia: Secondary | ICD-10-CM | POA: Diagnosis not present

## 2021-02-10 DIAGNOSIS — R5381 Other malaise: Secondary | ICD-10-CM | POA: Diagnosis not present

## 2021-02-10 DIAGNOSIS — D539 Nutritional anemia, unspecified: Secondary | ICD-10-CM | POA: Diagnosis not present

## 2021-02-10 DIAGNOSIS — D122 Benign neoplasm of ascending colon: Secondary | ICD-10-CM | POA: Diagnosis not present

## 2021-02-10 DIAGNOSIS — R2681 Unsteadiness on feet: Secondary | ICD-10-CM | POA: Diagnosis not present

## 2021-02-10 DIAGNOSIS — K319 Disease of stomach and duodenum, unspecified: Secondary | ICD-10-CM | POA: Diagnosis not present

## 2021-02-10 LAB — CBC WITH DIFFERENTIAL (CANCER CENTER ONLY)
Abs Immature Granulocytes: 0 10*3/uL (ref 0.00–0.07)
Basophils Absolute: 0 10*3/uL (ref 0.0–0.1)
Basophils Relative: 0 %
Eosinophils Absolute: 0.1 10*3/uL (ref 0.0–0.5)
Eosinophils Relative: 2 %
HCT: 36.5 % (ref 36.0–46.0)
Hemoglobin: 11.1 g/dL — ABNORMAL LOW (ref 12.0–15.0)
Immature Granulocytes: 0 %
Lymphocytes Relative: 34 %
Lymphs Abs: 0.9 10*3/uL (ref 0.7–4.0)
MCH: 30.1 pg (ref 26.0–34.0)
MCHC: 30.4 g/dL (ref 30.0–36.0)
MCV: 98.9 fL (ref 80.0–100.0)
Monocytes Absolute: 0.7 10*3/uL (ref 0.1–1.0)
Monocytes Relative: 29 %
Neutro Abs: 0.9 10*3/uL — ABNORMAL LOW (ref 1.7–7.7)
Neutrophils Relative %: 35 %
Platelet Count: 84 10*3/uL — ABNORMAL LOW (ref 150–400)
RBC: 3.69 MIL/uL — ABNORMAL LOW (ref 3.87–5.11)
RDW: 15.7 % — ABNORMAL HIGH (ref 11.5–15.5)
WBC Count: 2.6 10*3/uL — ABNORMAL LOW (ref 4.0–10.5)
nRBC: 0 % (ref 0.0–0.2)

## 2021-02-10 LAB — CMP (CANCER CENTER ONLY)
ALT: 6 U/L (ref 0–44)
AST: 19 U/L (ref 15–41)
Albumin: 3.5 g/dL (ref 3.5–5.0)
Alkaline Phosphatase: 57 U/L (ref 38–126)
Anion gap: 6 (ref 5–15)
BUN: 17 mg/dL (ref 8–23)
CO2: 29 mmol/L (ref 22–32)
Calcium: 9.1 mg/dL (ref 8.9–10.3)
Chloride: 105 mmol/L (ref 98–111)
Creatinine: 1.48 mg/dL — ABNORMAL HIGH (ref 0.44–1.00)
GFR, Estimated: 37 mL/min — ABNORMAL LOW (ref >60)
Glucose, Bld: 113 mg/dL — ABNORMAL HIGH (ref 70–99)
Potassium: 4.5 mmol/L (ref 3.5–5.1)
Sodium: 140 mmol/L (ref 135–145)
Total Bilirubin: 0.6 mg/dL (ref 0.3–1.2)
Total Protein: 6.4 g/dL — ABNORMAL LOW (ref 6.5–8.1)

## 2021-02-10 LAB — LACTATE DEHYDROGENASE: LDH: 1062 U/L — ABNORMAL HIGH (ref 98–192)

## 2021-02-10 NOTE — Telephone Encounter (Signed)
Per Dr Benay Spice Pt was called to inform her that Dr Benay Spice decided to order a CT Scan of Chest abdomen and pelvis with no IV Contrast. Pt stated she is claustrophobic and will need something before she goes to take the scan informed Pt he will prescribe something for her before the scan. Pt verbalized understanding

## 2021-02-10 NOTE — Progress Notes (Signed)
[place] OFFICE PROGRESS NOTE   Diagnosis: Pancytopenia  INTERVAL HISTORY:   [name] [name] returns as scheduled.  She continues to have anorexia and malaise.  She has exertional dyspnea.  No fever or night sweats.  No other complaint.  She reports undergoing a colonoscopy last week.  "Polyps "were removed from the colon. She was seen by Dr. Doy Mince at [facility] on 01/24/2021.  She agreed with a diagnosis of low risk MDS and recommended continuing monitoring with a repeat bone marrow for progressive cytopenias. Objective:  Vital signs in last 24 hours:  Blood pressure (!) 128/48, pulse 70, temperature 97.8 F (36.6 C), temperature source Oral, resp. rate 20, height _0  (1.702 m), weight 205 lb 9.6 oz (93.3 kg), SpO2 98 %.    Lymphatics: No cervical, supraclavicular, axillary, or inguinal nodes Resp: Lungs clear bilaterally Cardio: Regular rate and rhythm GI: No hepatosplenomegaly Vascular: No leg edema    Portacath/PICC-without erythema  Lab Results:  Lab Results  Component Value Date   WBC 3.0 (L) 01/17/2021   HGB 11.4 (L) 01/17/2021   HCT 36.4 01/17/2021   MCV 99.7 01/17/2021   PLT 98 (L) 01/17/2021   NEUTROABS 0.9 (L) 01/17/2021    CMP  Lab Results  Component Value Date   NA 142 09/12/2020   K 4.0 09/12/2020   CL 106 09/12/2020   CO2 28 09/12/2020   GLUCOSE 104 (H) 09/12/2020   BUN 16 09/12/2020   CREATININE 1.20 (H) 09/12/2020   CALCIUM 9.5 09/12/2020   PROT 6.1 07/31/2020   ALBUMIN 3.8 07/31/2020   AST 12 07/31/2020   ALT 5 07/31/2020   ALKPHOS 71 07/31/2020   BILITOT 0.9 07/31/2020   GFRNONAA 48 (L) 09/12/2020   GFRAA 46 (L) 11/15/2017    Medications: I have reviewed the patient's current medications.   Assessment/Plan: Macrocytic anemia/thrombocytopenia 08/13/2020 B12 3500 08/13/2020 LDH 612 08/13/2020 myeloma panel-no monoclonal protein  Bone marrow biopsy 08/20/2020-hypercellular marrow with erythroid hyperplasia and dyspoiesis,  rare lipogranuloma like lesions.  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  Negative myeloma FISH panel, 46XX [facility] screen - 08/26/2020 Fatigue Dyspnea on exertion Unsteady gait/balance disorder, followed by Dr. Tomi Likens CKD Atrial fibrillation Hypertension Sleep apnea    Disposition: Ms. Nied appears unchanged.  She continues to lose weight.  She has pancytopenia.  The LDH is higher.  I am concerned she is developing an evolving MDS versus another hematopoietic malignancy.  We decided to continue observation.  The plan is to schedule CTs of the chest, abdomen, and pelvis to look for evidence of a malignancy.  She will return for an office visit in 3-4 weeks.  [name] Coder, MD  02/10/2021  10:59 AM

## 2021-02-12 ENCOUNTER — Telehealth: Payer: Self-pay | Admitting: *Deleted

## 2021-02-12 NOTE — Telephone Encounter (Signed)
Left VM for patient that CT scan scheduled for 2/6 at [facility] radiology. Will send specific instructions via MyChart and she will need to pick up the oral contrast prior to that date. Requested confirmation via phone or MyChart.

## 2021-02-13 ENCOUNTER — Telehealth: Payer: Self-pay

## 2021-02-13 NOTE — Telephone Encounter (Signed)
Pt called in for a prescription to be given before CT scan explained to Pt that CT scan is open. Discussed with Dr Benay Spice who stated Pt can take benadryl before test. Pt states she will try the benadryl but if she feels shaky she will not do the Ct scan.

## 2021-02-17 ENCOUNTER — Ambulatory Visit (HOSPITAL_BASED_OUTPATIENT_CLINIC_OR_DEPARTMENT_OTHER)
Admission: RE | Admit: 2021-02-17 | Discharge: 2021-02-17 | Disposition: A | Discharge status: Home / Self Care | Payer: Medicare HMO | Source: Ambulatory Visit | Attending: Oncology | Admitting: Oncology

## 2021-02-17 ENCOUNTER — Other Ambulatory Visit: Payer: Self-pay

## 2021-02-17 DIAGNOSIS — R161 Splenomegaly, not elsewhere classified: Secondary | ICD-10-CM | POA: Insufficient documentation

## 2021-02-17 DIAGNOSIS — R918 Other nonspecific abnormal finding of lung field: Secondary | ICD-10-CM | POA: Insufficient documentation

## 2021-02-17 DIAGNOSIS — I700 Atherosclerosis of aorta: Secondary | ICD-10-CM | POA: Diagnosis not present

## 2021-02-17 DIAGNOSIS — N1832 Chronic kidney disease, stage 3b: Secondary | ICD-10-CM | POA: Diagnosis not present

## 2021-02-17 DIAGNOSIS — D649 Anemia, unspecified: Secondary | ICD-10-CM | POA: Insufficient documentation

## 2021-02-17 DIAGNOSIS — D61818 Other pancytopenia: Secondary | ICD-10-CM | POA: Insufficient documentation

## 2021-02-17 DIAGNOSIS — J9809 Other diseases of bronchus, not elsewhere classified: Secondary | ICD-10-CM | POA: Diagnosis not present

## 2021-02-17 DIAGNOSIS — I2510 Atherosclerotic heart disease of native coronary artery without angina pectoris: Secondary | ICD-10-CM | POA: Diagnosis not present

## 2021-02-19 ENCOUNTER — Telehealth: Payer: Self-pay

## 2021-02-19 NOTE — Telephone Encounter (Signed)
-----   Message from Ladell Pier, MD sent at 02/18/2021  5:09 PM EST ----- Please call patient, CT showing large spleen, no evidence of cancer, follow-up as scheduled

## 2021-02-26 DIAGNOSIS — N1832 Chronic kidney disease, stage 3b: Secondary | ICD-10-CM | POA: Diagnosis not present

## 2021-02-26 DIAGNOSIS — D631 Anemia in chronic kidney disease: Secondary | ICD-10-CM | POA: Diagnosis not present

## 2021-02-26 DIAGNOSIS — N2581 Secondary hyperparathyroidism of renal origin: Secondary | ICD-10-CM | POA: Diagnosis not present

## 2021-02-26 DIAGNOSIS — I129 Hypertensive chronic kidney disease with stage 1 through stage 4 chronic kidney disease, or unspecified chronic kidney disease: Secondary | ICD-10-CM | POA: Diagnosis not present

## 2021-03-10 ENCOUNTER — Inpatient Hospital Stay: Payer: Medicare HMO | Attending: Oncology

## 2021-03-10 ENCOUNTER — Inpatient Hospital Stay: Payer: Medicare HMO | Admitting: Nurse Practitioner

## 2021-03-10 ENCOUNTER — Encounter: Payer: Self-pay | Admitting: *Deleted

## 2021-03-10 ENCOUNTER — Encounter: Payer: Self-pay | Admitting: Nurse Practitioner

## 2021-03-10 ENCOUNTER — Other Ambulatory Visit: Payer: Self-pay

## 2021-03-10 VITALS — BP 134/60 | HR 97 | Temp 97.8°F | Resp 20 | Ht 67.0 in | Wt 189.4 lb

## 2021-03-10 DIAGNOSIS — D539 Nutritional anemia, unspecified: Secondary | ICD-10-CM | POA: Insufficient documentation

## 2021-03-10 DIAGNOSIS — R634 Abnormal weight loss: Secondary | ICD-10-CM | POA: Diagnosis not present

## 2021-03-10 DIAGNOSIS — R2681 Unsteadiness on feet: Secondary | ICD-10-CM | POA: Insufficient documentation

## 2021-03-10 DIAGNOSIS — D649 Anemia, unspecified: Secondary | ICD-10-CM

## 2021-03-10 DIAGNOSIS — N189 Chronic kidney disease, unspecified: Secondary | ICD-10-CM | POA: Diagnosis not present

## 2021-03-10 DIAGNOSIS — D61818 Other pancytopenia: Secondary | ICD-10-CM | POA: Diagnosis not present

## 2021-03-10 DIAGNOSIS — I129 Hypertensive chronic kidney disease with stage 1 through stage 4 chronic kidney disease, or unspecified chronic kidney disease: Secondary | ICD-10-CM | POA: Insufficient documentation

## 2021-03-10 DIAGNOSIS — R61 Generalized hyperhidrosis: Secondary | ICD-10-CM | POA: Insufficient documentation

## 2021-03-10 DIAGNOSIS — R0609 Other forms of dyspnea: Secondary | ICD-10-CM | POA: Diagnosis not present

## 2021-03-10 DIAGNOSIS — I4891 Unspecified atrial fibrillation: Secondary | ICD-10-CM | POA: Diagnosis not present

## 2021-03-10 DIAGNOSIS — I2510 Atherosclerotic heart disease of native coronary artery without angina pectoris: Secondary | ICD-10-CM | POA: Insufficient documentation

## 2021-03-10 DIAGNOSIS — G4730 Sleep apnea, unspecified: Secondary | ICD-10-CM | POA: Insufficient documentation

## 2021-03-10 DIAGNOSIS — Z79899 Other long term (current) drug therapy: Secondary | ICD-10-CM | POA: Diagnosis not present

## 2021-03-10 LAB — CBC WITH DIFFERENTIAL (CANCER CENTER ONLY)
Abs Immature Granulocytes: 0 10*3/uL (ref 0.00–0.07)
Band Neutrophils: 1 %
Basophils Absolute: 0 10*3/uL (ref 0.0–0.1)
Basophils Relative: 0 %
Eosinophils Absolute: 0.1 10*3/uL (ref 0.0–0.5)
Eosinophils Relative: 2 %
HCT: 32.7 % — ABNORMAL LOW (ref 36.0–46.0)
Hemoglobin: 10.2 g/dL — ABNORMAL LOW (ref 12.0–15.0)
Lymphocytes Relative: 40 %
Lymphs Abs: 1.6 10*3/uL (ref 0.7–4.0)
MCH: 29.2 pg (ref 26.0–34.0)
MCHC: 31.2 g/dL (ref 30.0–36.0)
MCV: 93.7 fL (ref 80.0–100.0)
Monocytes Absolute: 0.9 10*3/uL (ref 0.1–1.0)
Monocytes Relative: 21 %
Myelocytes: 1 %
Neutro Abs: 1.5 10*3/uL — ABNORMAL LOW (ref 1.7–7.7)
Neutrophils Relative %: 35 %
Platelet Count: 121 10*3/uL — ABNORMAL LOW (ref 150–400)
RBC: 3.49 MIL/uL — ABNORMAL LOW (ref 3.87–5.11)
RDW: 17 % — ABNORMAL HIGH (ref 11.5–15.5)
WBC Count: 4.1 10*3/uL (ref 4.0–10.5)
nRBC: 0 % (ref 0.0–0.2)

## 2021-03-10 LAB — CMP (CANCER CENTER ONLY)
ALT: 10 U/L (ref 0–44)
AST: 20 U/L (ref 15–41)
Albumin: 3.5 g/dL (ref 3.5–5.0)
Alkaline Phosphatase: 56 U/L (ref 38–126)
Anion gap: 8 (ref 5–15)
BUN: 21 mg/dL (ref 8–23)
CO2: 27 mmol/L (ref 22–32)
Calcium: 9.4 mg/dL (ref 8.9–10.3)
Chloride: 103 mmol/L (ref 98–111)
Creatinine: 1.66 mg/dL — ABNORMAL HIGH (ref 0.44–1.00)
GFR, Estimated: 32 mL/min — ABNORMAL LOW (ref >60)
Glucose, Bld: 102 mg/dL — ABNORMAL HIGH (ref 70–99)
Potassium: 4.7 mmol/L (ref 3.5–5.1)
Sodium: 138 mmol/L (ref 135–145)
Total Bilirubin: 0.8 mg/dL (ref 0.3–1.2)
Total Protein: 6.4 g/dL — ABNORMAL LOW (ref 6.5–8.1)

## 2021-03-10 LAB — LACTATE DEHYDROGENASE: LDH: 652 U/L — ABNORMAL HIGH (ref 98–192)

## 2021-03-10 NOTE — Progress Notes (Signed)
[place] OFFICE PROGRESS NOTE   Diagnosis: Pancytopenia  INTERVAL HISTORY:   [name] [name] returns as scheduled.  She does not feel well.  She has progressive fatigue.  No fever.  She has night sweats typically 4 times a week.  Appetite is poor.  She continues to lose weight.  She denies pain.  She has not noticed any enlarged lymph nodes.  Objective:  Vital signs in last 24 hours:  Blood pressure 134/60, pulse 97, temperature 97.8 F (36.6 C), temperature source Oral, resp. rate 20, height $RemoveBe'5\' 7"'PoBLtLYuW$  (1.702 m), weight 189 lb 6.4 oz (85.9 kg), SpO2 100 %.    HEENT: No thrush or ulcers. Lymphatics: No palpable cervical, supraclavicular, axillary or inguinal lymph nodes. Resp: Lungs clear bilaterally. Cardio: Regular rate and rhythm. GI: Abdomen soft.  Tender at the left abdomen, associated fullness. Vascular: No leg edema.   Lab Results:  Lab Results  Component Value Date   WBC 4.1 03/10/2021   HGB 10.2 (L) 03/10/2021   HCT 32.7 (L) 03/10/2021   MCV 93.7 03/10/2021   PLT 121 (L) 03/10/2021   NEUTROABS 1.5 (L) 03/10/2021    Imaging:  No results found.  Medications: I have reviewed the patient's current medications.  Assessment/Plan: Macrocytic anemia/thrombocytopenia 08/13/2020 B12 3500 08/13/2020 LDH 612 08/13/2020 myeloma panel-no monoclonal protein  Bone marrow biopsy 08/20/2020-hypercellular marrow with erythroid hyperplasia and dyspoiesis, rare lipogranuloma like lesions.  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  Negative myeloma FISH panel, 46XX P[facility] screen - 08/26/2020 CTs 02/17/2021-new splenomegaly; diffuse bilateral bronchial wall thickening; clustered groundglass and fine nodular opacity in the dependent right lower lobe.  CAD. Fatigue Dyspnea on exertion Unsteady gait/balance disorder, followed by Dr. Tomi Likens CKD Atrial fibrillation Hypertension Sleep apnea Anorexia/weight loss  Disposition: [name] [name] has persistent  pancytopenia.  She has anorexia/weight loss.  LDH is elevated.  CT scans did not show adenopathy.  Spleen noted to be enlarged.  Dr. Benay Spice is concern for MDS/MPD.  We made an urgent referral to Dr. Leretha Pol at [facility].  She will return for follow-up here in approximately 3 weeks.  We are available to see her sooner if needed.  Patient seen with Dr. Benay Spice.    Ned Card ANP/GNP-BC   03/10/2021  10:15 AM  This was a shared visit with Ned Card.  [name] [name] was interviewed and examined.  She continues to have systemic symptoms including anorexia/weight loss and sweats.  She has persistent pancytopenia with an elevated LDH.  CTs did not reveal evidence of a malignancy or lymphoma.  The spleen is enlarged.  Her specific diagnosis remains unclear.  I suspect she has a myelodysplastic/myeloproliferative disorder with associated systemic symptoms.  We will make a referral to the hematology service at [facility] for recommendations regarding the diagnosis and treatment options.  She is in agreement.  I was present for greater than 50% today's visit.  I performed medical decision making.  [name], MD

## 2021-03-10 NOTE — Progress Notes (Signed)
Faxed referral order, demographics and all pathology/cytology/molecular results to [facility] for Dr. Leretha Pol at [phone]. Will call office on 2/28 to follow up.

## 2021-03-11 ENCOUNTER — Telehealth: Payer: Self-pay

## 2021-03-11 ENCOUNTER — Other Ambulatory Visit: Payer: Self-pay | Admitting: Nurse Practitioner

## 2021-03-11 ENCOUNTER — Encounter: Payer: Self-pay | Admitting: *Deleted

## 2021-03-11 DIAGNOSIS — D649 Anemia, unspecified: Secondary | ICD-10-CM

## 2021-03-11 MED ORDER — ALPRAZOLAM 0.5 MG PO TABS: 0.5000 mg | ORAL_TABLET | Freq: Three times a day (TID) | ORAL | 0 refills | Status: DC | PRN

## 2021-03-11 NOTE — Telephone Encounter (Signed)
Patient called and left a voicemail stating she need a script for Zantac. I called and spoke with the patient to let her know it is a over the counter medicine. Patient voiced understanding

## 2021-03-11 NOTE — Telephone Encounter (Signed)
Patient called left a voicemail stated she was not sure  what the provider going to send in. Spoke with the provider to clarify which script she was going to send in. Provider send in Xanax not zantac. Called the patient to let her know the provider send in Xanax. Patient understanding of instruction and had no further questions or concerns  at this time

## 2021-03-11 NOTE — Progress Notes (Signed)
Call to [facility] and spoke w/Amy. Confirmed records were received and they have been forwarded to Dr. Leretha Pol to review. Patient will be called when appointment is provided.

## 2021-03-13 DIAGNOSIS — F331 Major depressive disorder, recurrent, moderate: Secondary | ICD-10-CM | POA: Diagnosis not present

## 2021-03-13 DIAGNOSIS — F419 Anxiety disorder, unspecified: Secondary | ICD-10-CM | POA: Diagnosis not present

## 2021-03-17 ENCOUNTER — Other Ambulatory Visit (HOSPITAL_BASED_OUTPATIENT_CLINIC_OR_DEPARTMENT_OTHER): Payer: Medicare HMO

## 2021-03-17 DIAGNOSIS — D61818 Other pancytopenia: Secondary | ICD-10-CM | POA: Diagnosis not present

## 2021-03-17 DIAGNOSIS — D469 Myelodysplastic syndrome, unspecified: Secondary | ICD-10-CM | POA: Diagnosis not present

## 2021-03-22 ENCOUNTER — Other Ambulatory Visit: Payer: Self-pay

## 2021-03-22 ENCOUNTER — Emergency Department (HOSPITAL_COMMUNITY)
Admission: EM | Admit: 2021-03-22 | Discharge: 2021-03-22 | Disposition: A | Discharge status: Home / Self Care | Payer: Medicare HMO | Attending: Emergency Medicine | Admitting: Emergency Medicine

## 2021-03-22 ENCOUNTER — Emergency Department (HOSPITAL_COMMUNITY): Payer: Medicare HMO

## 2021-03-22 ENCOUNTER — Encounter (HOSPITAL_COMMUNITY): Payer: Self-pay | Admitting: *Deleted

## 2021-03-22 DIAGNOSIS — I4891 Unspecified atrial fibrillation: Secondary | ICD-10-CM | POA: Diagnosis not present

## 2021-03-22 DIAGNOSIS — N183 Chronic kidney disease, stage 3 unspecified: Secondary | ICD-10-CM | POA: Insufficient documentation

## 2021-03-22 DIAGNOSIS — Z79899 Other long term (current) drug therapy: Secondary | ICD-10-CM | POA: Diagnosis not present

## 2021-03-22 DIAGNOSIS — I129 Hypertensive chronic kidney disease with stage 1 through stage 4 chronic kidney disease, or unspecified chronic kidney disease: Secondary | ICD-10-CM | POA: Diagnosis not present

## 2021-03-22 DIAGNOSIS — R000 Tachycardia, unspecified: Secondary | ICD-10-CM | POA: Diagnosis not present

## 2021-03-22 DIAGNOSIS — Z7901 Long term (current) use of anticoagulants: Secondary | ICD-10-CM | POA: Diagnosis not present

## 2021-03-22 DIAGNOSIS — Z95810 Presence of automatic (implantable) cardiac defibrillator: Secondary | ICD-10-CM | POA: Diagnosis not present

## 2021-03-22 HISTORY — DX: Unspecified atrial fibrillation: I48.91

## 2021-03-22 LAB — CBC
HCT: 31.1 % — ABNORMAL LOW (ref 36.0–46.0)
Hemoglobin: 9.5 g/dL — ABNORMAL LOW (ref 12.0–15.0)
MCH: 30.2 pg (ref 26.0–34.0)
MCHC: 30.5 g/dL (ref 30.0–36.0)
MCV: 98.7 fL (ref 80.0–100.0)
Platelets: 159 10*3/uL (ref 150–400)
RBC: 3.15 MIL/uL — ABNORMAL LOW (ref 3.87–5.11)
RDW: 18.6 % — ABNORMAL HIGH (ref 11.5–15.5)
WBC: 4 10*3/uL (ref 4.0–10.5)
nRBC: 0 % (ref 0.0–0.2)

## 2021-03-22 LAB — BASIC METABOLIC PANEL
Anion gap: 10 (ref 5–15)
BUN: 17 mg/dL (ref 8–23)
CO2: 26 mmol/L (ref 22–32)
Calcium: 9 mg/dL (ref 8.9–10.3)
Chloride: 104 mmol/L (ref 98–111)
Creatinine, Ser: 1.59 mg/dL — ABNORMAL HIGH (ref 0.44–1.00)
GFR, Estimated: 34 mL/min — ABNORMAL LOW (ref >60)
Glucose, Bld: 127 mg/dL — ABNORMAL HIGH (ref 70–99)
Potassium: 4.2 mmol/L (ref 3.5–5.1)
Sodium: 140 mmol/L (ref 135–145)

## 2021-03-22 LAB — TROPONIN I (HIGH SENSITIVITY)
Troponin I (High Sensitivity): 6 ng/L (ref <18)
Troponin I (High Sensitivity): 7 ng/L (ref <18)

## 2021-03-22 MED ORDER — ETOMIDATE 2 MG/ML IV SOLN
5.0000 mg | Freq: Once | INTRAVENOUS | Status: AC
  Administered 2021-03-22: 5 mg via INTRAVENOUS
  Filled 2021-03-22: qty 10

## 2021-03-22 NOTE — Sedation Documentation (Signed)
Pt in NSR  ?

## 2021-03-22 NOTE — ED Notes (Signed)
RN reviewed discharge instructions with pt. Pt verbalized understanding and had no further questions. VSS upon discharge.  

## 2021-03-22 NOTE — Discharge Instructions (Addendum)
You came to the emergency department today to be evaluated for your palpitations.  You were found to be in atrial fibrillation with a rapid ventricular rate.  Due to this you received cardioversion which put your heart back in a normal sinus rhythm.  Please continue to take all your medications as prescribed.  Please follow-up with your cardiologist. ? ?Get help right away if you have: ?Chest pain, abdominal pain, sweating, or weakness. ?Trouble breathing. ?Side effects of blood thinners, such as blood in your vomit, stool, or urine, or bleeding that cannot stop. ?Any symptoms of a stroke. "BE FAST" is an easy way to remember the main warning signs of a stroke: ?B - Balance. Signs are dizziness, sudden trouble walking, or loss of balance. ?E - Eyes. Signs are trouble seeing or a sudden change in vision. ?F - Face. Signs are sudden weakness or numbness of the face, or the face or eyelid drooping on one side. ?A - Arms. Signs are weakness or numbness in an arm. This happens suddenly and usually on one side of the body. ?S - Speech. Signs are sudden trouble speaking, slurred speech, or trouble understanding what people say. ?T - Time. Time to call emergency services. Write down what time symptoms started. ?Other signs of a stroke, such as: ?A sudden, severe headache with no known cause. ?Nausea or vomiting. ?Seizure. ?

## 2021-03-22 NOTE — ED Triage Notes (Signed)
Pt was eating supper and her heart began racing.  HR 150's.  Last took her eloquis this am. ?

## 2021-03-22 NOTE — ED Notes (Signed)
ED Provider at bedside. 

## 2021-03-22 NOTE — ED Provider Notes (Signed)
[facility] EMERGENCY DEPARTMENT Provider Note   CSN: [id number] Arrival date & time: 03/22/21  1836     History  Chief Complaint  Patient presents with   Tachycardia    [name] [name] is a 75 y.o. female with a past medical history of paroxysmal atrial fibrillation (on metoprolol and Eliquis), hypertension, CKD stage III, myodysplastic syndrome.  Presents to the emergency department chief plaint of palpitations.  Patient states that today at 5 PM she had a onset of palpitations and a pounding sensation in her temples.  Patient reports that this is consistent with previous episodes of atrial fibrillation she has been in.  Patient reports previous cardioversion.  Has been taking Eliquis as prescribed except for a 1 week break when she had a colonoscopy on 02/06/2021.  Patient reports that she last took Eliquis this morning.  Reports that she takes metoprolol nightly.  Has not missed any doses of this medication.  Patient denies any chest pain, shortness of breath, lightheadedness, or syncope.  HPI     Home Medications Prior to Admission medications   Medication Sig Start Date End Date Taking? Authorizing Provider  albuterol (VENTOLIN HFA) 108 (90 Base) MCG/ACT inhaler Inhale 1-2 puffs into the lungs every 4 (four) hours as needed for wheezing or shortness of breath. 07/22/18   Parrett, Fonnie Mu, NP  ALPRAZolam (XANAX) 0.5 MG tablet Take 1 tablet (0.5 mg total) by mouth 3 (three) times daily as needed for anxiety. 03/11/21   Owens Shark, NP  amLODipine (NORVASC) 5 MG tablet Take 1 tablet (5 mg total) by mouth daily. 11/04/18   Jerline Pain, MD  Ascorbic Acid (VITAMIN C WITH ROSE HIPS) 1000 MG tablet Take 1,000 mg by mouth daily.    [provider]  benzonatate (TESSALON) 200 MG capsule Take 1 capsule by mouth three times daily as needed for cough 05/22/19   Parrett, Tammy S, NP  buPROPion (WELLBUTRIN SR) 200 MG 12 hr tablet Take 400 mg by mouth daily.     [provider]  busPIRone (BUSPAR) 10 MG tablet Take 10 mg by mouth 2 (two) times daily.    [provider]  CALCIUM PO Take 1 tablet by mouth every morning.    [provider]  Cholecalciferol (VITAMIN D) 2000 UNITS tablet Take 4,000 Units by mouth daily.    [provider]  [facility]S MELATONIN PO Take 1 tablet by mouth at bedtime as needed.    [provider]  ELIQUIS 5 MG TABS tablet TAKE 1 TABLET TWICE DAILY 11/04/20   Jerline Pain, MD  fexofenadine (ALLEGRA) 180 MG tablet Take 180 mg by mouth daily as needed for allergies or rhinitis.    [provider]  furosemide (LASIX) 20 MG tablet TAKE 1 TABLET EVERY DAY 01/29/21   Jerline Pain, MD  levothyroxine (SYNTHROID) 75 MCG tablet TAKE 1 TABLET DAILY BEFORE BREAKFAST (NEED APPOINTMENT FOR FURTHER REFILLS) 01/15/21   Renato Shin, MD  metoprolol succinate (TOPROL-XL) 25 MG 24 hr tablet TAKE 1/2 TABLET ONE TIME DAILY 01/29/21   Jerline Pain, MD  potassium chloride SA (KLOR-CON M) 20 MEQ tablet TAKE 1 TABLET EVERY DAY 01/29/21   Jerline Pain, MD  pravastatin (PRAVACHOL) 40 MG tablet Take 40 mg by mouth at bedtime.    [provider]  Spacer/Aero-Holding Chambers (AEROCHAMBER MV) inhaler Use as instructed 10/13/18   Baird Lyons D, MD  Tiotropium Bromide Monohydrate (SPIRIVA RESPIMAT) 2.5 MCG/ACT AERS Inhale 2  puffs into the lungs daily. 08/02/20   Deneise Lever, MD  vitamin B-12 (CYANOCOBALAMIN) 250 MCG tablet Take 250 mcg by mouth daily.    [provider]      Allergies    Omnicef [cefdinir], Lexapro [escitalopram], Penicillins, Peroxide [hydrogen peroxide], Zoloft [sertraline hcl], and Neosporin [neomycin-bacitracin zn-polymyx]    Review of Systems   Review of Systems  Constitutional:  Negative for chills and fever.  Eyes:  Negative for visual disturbance.  Respiratory:  Negative for shortness of breath.   Cardiovascular:  Positive for palpitations. Negative for chest  pain and leg swelling.  Gastrointestinal:  Negative for abdominal pain, nausea and vomiting.  Genitourinary:  Negative for difficulty urinating and dysuria.  Musculoskeletal:  Negative for back pain and neck pain.  Skin:  Negative for color change and rash.  Neurological:  Negative for dizziness, syncope, light-headedness and headaches.  Psychiatric/Behavioral:  Negative for confusion.    Physical Exam Updated Vital Signs BP 116/85 (BP Location: Right Arm)    Pulse (!) 156    Temp 99.3 F (37.4 C) (Oral)    Ht '5\' 7"'$  (1.702 m)    Wt 85.9 kg    SpO2 99%    BMI 29.66 kg/m  Physical Exam Vitals and nursing note reviewed.  Constitutional:      General: She is not in acute distress.    Appearance: She is not ill-appearing, toxic-appearing or diaphoretic.  HENT:     Head: Normocephalic.  Eyes:     General: No scleral icterus.       Right eye: No discharge.        Left eye: No discharge.  Cardiovascular:     Rate and Rhythm: Tachycardia present. Rhythm irregularly irregular.     Pulses:          Radial pulses are 2+ on the right side and 2+ on the left side.     Comments: Tachycardic with rate in the 140s Pulmonary:     Effort: Pulmonary effort is normal. No respiratory distress.     Comments: Speaks in full complete sentences without difficulty Skin:    General: Skin is warm and dry.  Neurological:     General: No focal deficit present.     Mental Status: She is alert and oriented to person, place, and time.     GCS: GCS eye subscore is 4. GCS verbal subscore is 5. GCS motor subscore is 6.  Psychiatric:        Behavior: Behavior is cooperative.    ED Results / Procedures / Treatments   Labs (all labs ordered are listed, but only abnormal results are displayed) Labs Reviewed  CBC - Abnormal; Notable for the following components:      Result Value   RBC 3.15 (*)    Hemoglobin 9.5 (*)    HCT 31.1 (*)    RDW 18.6 (*)    All other components within normal limits  BASIC  METABOLIC PANEL  TROPONIN I (HIGH SENSITIVITY)    EKG EKG Interpretation  Date/Time:  Saturday March 22 2021 18:47:43 EST Ventricular Rate:  154 PR Interval:    QRS Duration: 82 QT Interval:  308 QTC Calculation: 493 R Axis:   42 Text Interpretation: Atrial fibrillation with rapid ventricular response Low voltage QRS Marked ST abnormality, possible inferior subendocardial injury Abnormal ECG When compared with ECG of 07-Jul-2016 13:31, PREVIOUS ECG IS PRESENT afib is new Confirmed by Davonna Belling [phone]) on 03/22/2021 6:58:45 PM  Radiology DG  Chest Portable 1 View  Result Date: 03/22/2021 CLINICAL DATA:  hr 156 EXAM: PORTABLE CHEST 1 VIEW COMPARISON:  Chest x-ray 07/31/2020, CT chest 02/17/2021 FINDINGS: The heart and mediastinal contours are unchanged. No focal consolidation. No pulmonary edema. No pleural effusion. No pneumothorax. No acute osseous abnormality. IMPRESSION: No active disease. Electronically Signed   By: Iven Finn M.D.   On: 03/22/2021 19:29    Procedures .Critical Care Performed by: Loni Beckwith, PA-C Authorized by: Loni Beckwith, PA-C   Critical care provider statement:    Critical care time (minutes):  30   Critical care was necessary to treat or prevent imminent or life-threatening deterioration of the following conditions:  Cardiac failure   Critical care was time spent personally by me on the following activities:  Development of treatment plan with patient or surrogate, discussions with consultants, evaluation of patient's response to treatment, examination of patient, ordering and review of laboratory studies, ordering and review of radiographic studies, ordering and performing treatments and interventions, pulse oximetry, re-evaluation of patient's condition, review of old charts and obtaining history from patient or surrogate .Cardioversion  Date/Time: 03/23/2021 1:46 AM Performed by: Loni Beckwith, PA-C Authorized by:  Loni Beckwith, PA-C   Consent:    Consent obtained:  Verbal   Consent given by:  Patient   Alternatives discussed:  No treatment, rate-control medication and alternative treatment Pre-procedure details:    Cardioversion basis:  Emergent   Rhythm:  Atrial fibrillation   Electrode placement:  Anterior-lateral Patient sedated: Yes. Refer to sedation procedure documentation for details of sedation.  Attempt one:    Cardioversion mode:  Synchronous   Shock (Joules):  150   Shock outcome:  Conversion to normal sinus rhythm Post-procedure details:    Patient status:  Awake   Patient tolerance of procedure:  Tolerated well, no immediate complications .Sedation  Date/Time: 03/23/2021 1:47 AM Performed by: Loni Beckwith, PA-C Authorized by: Loni Beckwith, PA-C   Consent:    Consent obtained:  Verbal   Consent given by:  Patient   Risks discussed:  Allergic reaction, prolonged hypoxia resulting in organ damage, prolonged sedation necessitating reversal, respiratory compromise necessitating ventilatory assistance and intubation, vomiting, nausea and inadequate sedation Universal protocol:    Immediately prior to procedure, a time out was called: yes     Patient identity confirmed:  Verbally with patient and arm band Indications:    Procedure performed:  Cardioversion Pre-sedation assessment:    Time since last food or drink:  4 hours   ASA classification: class 3 - patient with severe systemic disease     Mallampati score:  II - soft palate, uvula, fauces visible   Pre-sedation assessments completed and reviewed: airway patency, mental status and respiratory function   Immediate pre-procedure details:    Reassessment: Patient reassessed immediately prior to procedure     Reviewed: vital signs and relevant labs/tests     Verified: bag valve mask available, emergency equipment available, intubation equipment available, IV patency confirmed, oxygen available, reversal  medications available and suction available   Procedure details (see MAR for exact dosages):    Preoxygenation:  Nasal cannula   Sedation:  Etomidate   Intended level of sedation: deep   Intra-procedure monitoring:  Blood pressure monitoring, cardiac monitor, continuous capnometry, continuous pulse oximetry and frequent vital sign checks   Intra-procedure events: none     Total Provider sedation time (minutes):  10 Post-procedure details:    Attendance: Constant attendance by certified  staff until patient recovered     Recovery: Patient returned to pre-procedure baseline     Post-sedation assessments completed and reviewed: airway patency, mental status and respiratory function     Procedure completion:  Tolerated well, no immediate complications    Medications Ordered in ED Medications  etomidate (AMIDATE) injection 5 mg (5 mg Intravenous Given 03/22/21 2039)    ED Course/ Medical Decision Making/ A&P                           Medical Decision Making Amount and/or Complexity of Data Reviewed Labs: ordered. Radiology: ordered.  Risk Prescription drug management.   Alert 76 year old female in no acute distress, nontoxic-appearing.  Presents emergency department with a chief complaint of palpitations.  Information is obtained from patient and patient's daughter at bedside.  Past medical records were reviewed including previous provider notes and lab.  Patient has past medical history as outlined in HPI which complicates her care.  EKG was independently interpreted by myself and shows atrial fibrillation with RVR.  Patient had onset of palpitations within the last 24 hours.  Patient has been on anticoagulation.  Due to 1 week pause for her colonoscopy we will reach out to on-call cardiologist to ensure that cardioversion can be performed.  I spoke with Dr. Yancey Flemings who advised that patient 6 weeks of anticoagulation make her a candidate for cardioversion at this time.  I independently  viewed and interpreted patient's chest x-ray.  Agree with radiology interpretation of no acute cardiopulmonary disease.  I independently viewed and interpreted patient's lab work.  Pertinent findings include: -Hemoglobin at 9.5, appears slightly below patient's baseline. -Creatinine 1.59, appears at patient's baseline. -Troponin 6  Verbal consent for sedation and cardioversion obtained from patient.  Cardioversion sedation performed as noted above.  Attending physician Dr. Alvino Chapel present for cardioversion and sedation.  Cardioversion was successful with return to sinus rhythm.  Patient had no complications during cardioversion and sedation procedures.  Return to baseline.    Repeat EKG shows sinus rhythm.  Patient hemodynamically stable.  Admission was considered however with return to sinus rhythm patient in no acute distress will discharge at this time.  Patient to follow-up closely with her cardiologist.  Patient was discussed with and evaluated by Dr. Alvino Chapel.  Discussed results, findings, treatment and follow up. Patient and patient's daughter advised of return precautions. Patient and patient's daughter verbalized understanding and agreed with plan.          Final Clinical Impression(s) / ED Diagnoses Final diagnoses:  Atrial fibrillation with RVR ([place])  Atrial fibrillation status post cardioversion [facility])    Rx / DC Orders ED Discharge Orders     None         Dyann Ruddle 03/23/21 0153    Davonna Belling, MD 03/23/21 1102

## 2021-03-26 DIAGNOSIS — I700 Atherosclerosis of aorta: Secondary | ICD-10-CM

## 2021-03-26 DIAGNOSIS — N183 Chronic kidney disease, stage 3 unspecified: Secondary | ICD-10-CM

## 2021-03-26 DIAGNOSIS — E032 Hypothyroidism due to medicaments and other exogenous substances: Secondary | ICD-10-CM

## 2021-03-26 DIAGNOSIS — D469 Myelodysplastic syndrome, unspecified: Secondary | ICD-10-CM | POA: Diagnosis not present

## 2021-03-26 HISTORY — DX: Hypothyroidism due to medicaments and other exogenous substances: E03.2

## 2021-03-26 HISTORY — DX: Atherosclerosis of aorta: I70.0

## 2021-03-26 HISTORY — DX: Chronic kidney disease, stage 3 unspecified: N18.30

## 2021-03-27 ENCOUNTER — Other Ambulatory Visit: Payer: Self-pay

## 2021-03-27 ENCOUNTER — Ambulatory Visit (HOSPITAL_BASED_OUTPATIENT_CLINIC_OR_DEPARTMENT_OTHER): Payer: Medicare HMO | Admitting: Family

## 2021-03-27 ENCOUNTER — Encounter (HOSPITAL_BASED_OUTPATIENT_CLINIC_OR_DEPARTMENT_OTHER): Payer: Self-pay | Admitting: Family

## 2021-03-27 VITALS — BP 122/52 | HR 64 | Ht 67.0 in | Wt 193.6 lb

## 2021-03-27 DIAGNOSIS — I340 Nonrheumatic mitral (valve) insufficiency: Secondary | ICD-10-CM | POA: Diagnosis not present

## 2021-03-27 DIAGNOSIS — E782 Mixed hyperlipidemia: Secondary | ICD-10-CM | POA: Diagnosis not present

## 2021-03-27 DIAGNOSIS — I10 Essential (primary) hypertension: Secondary | ICD-10-CM | POA: Diagnosis not present

## 2021-03-27 DIAGNOSIS — D6859 Other primary thrombophilia: Secondary | ICD-10-CM | POA: Diagnosis not present

## 2021-03-27 DIAGNOSIS — I4819 Other persistent atrial fibrillation: Secondary | ICD-10-CM | POA: Diagnosis not present

## 2021-03-27 MED ORDER — APIXABAN 5 MG PO TABS: 5.0000 mg | ORAL_TABLET | Freq: Two times a day (BID) | ORAL | 3 refills | Status: DC

## 2021-03-27 NOTE — Patient Instructions (Signed)
Medication Instructions:  ?Continue your current medications.  ? ?If you have another episode of atrial fibrillation, you can try taking an extra half tablet of Metoprolol to slow the heart rate and return to normal rhythm. If that does not work, please call our office or proceed to emergency department for evaluation. ? ?If you have recurrent episodes of atrial fibrillation we may consider adding additional medications.  ? ?*If you need a refill on your cardiac medications before your next appointment, please call your pharmacy* ? ? ?Lab Work: ?None ordered today.  ? ?Testing/Procedures: ?Your EKG today showed normal sinus rhythm. ? ? ?Follow-Up: ?At [facility], you and your health needs are our priority.  As part of our continuing mission to provide you with exceptional heart care, we have created designated Provider Care Teams.  These Care Teams include your primary Cardiologist (physician) and Advanced Practice Providers (APPs -  Physician Assistants and Nurse Practitioners) who all work together to provide you with the care you need, when you need it. ? ?We recommend signing up for the patient portal called "MyChart".  Sign up information is provided on this After Visit Summary.  MyChart is used to connect with patients for Virtual Visits (Telemedicine).  Patients are able to view lab/test results, encounter notes, upcoming appointments, etc.  Non-urgent messages can be sent to your provider as well.   ?To learn more about what you can do with MyChart, go to [email or web address].   ? ?Your next appointment:   ?6 month(s) ? ?The format for your next appointment:   ?In Person ? ?Provider:   ?Candee Furbish, MD   ? ? ?Other Instructions ? ?Heart Healthy Diet Recommendations: ?A low-salt diet is recommended. Meats should be grilled, baked, or boiled. Avoid fried foods. Focus on lean protein sources like fish or chicken with vegetables and fruits. The American Heart Association is a Microbiologist!  American  Heart Association Diet and Lifeystyle Recommendations   ? ?Exercise recommendations: ?The American Heart Association recommends 150 minutes of moderate intensity exercise weekly. ?Try 30 minutes of moderate intensity exercise 4-5 times per week. ?This could include walking, jogging, or swimming.   ?

## 2021-03-27 NOTE — Progress Notes (Addendum)
? ?Office Visit  ?  ?Patient Name: [name] [name] ?Date of Encounter: 03/27/2021 ? ?PCP:  Harlan Stains, MD ?  ?[place]  ?Cardiologist:  Candee Furbish, MD  ?Advanced Practice Provider:  No care team member to display ?Electrophysiologist:  None  ?   ? ?Chief Complaint  ?  ?[name] [name] is a 76 y.o. female with a hx of atrial fibrillation, chronic anticoagulation, hypothyroidism, mitral regurgitation, hypertension, hyperlipidemia  presents today for follow up of ED visit for atrial fibrillation  ? ?Past Medical History  ?  ?Past Medical History:  ?Diagnosis Date  ? A-fib ([place])   ? paroxysmal  ? Anxiety   ? Arthritis   ? Cataracts, bilateral   ? immature  ? CKD (chronic kidney disease) stage 3, GFR 30-59 ml/min (HCC)   ? Depression   ? GERD (gastroesophageal reflux disease)   ? was on Nexium   ? GERD (gastroesophageal reflux disease)   ? Hemorrhoid   ? History of bronchitis   ? couple of yrs ago  ? History of colon polyps   ? History of peristent atrial fibrillation   ? Hyperlipidemia   ? takes Pravastatin daily  ? Hypertension   ? takes Amlodipine and Diovan daily  ? Hypertension   ? Pre-diabetes   ? Sleep apnea   ? no cpap used  ? Sleep apnea   ? study done 20+yrs ago and doesn't use cpap  ? Urinary urgency   ? Weakness   ? in left leg r/t back  ? ?Past Surgical History:  ?Procedure Laterality Date  ? ABDOMINAL HYSTERECTOMY  1980  ? partial  ? ABDOMINAL HYSTERECTOMY    ? partial  ? BACK SURGERY    ? CARDIOVERSION N/A 02/24/2016  ? Procedure: CARDIOVERSION;  Surgeon: Fay Records, MD;  Location: [facility];  Service: Cardiovascular;  Laterality: N/A;  ? CARDIOVERSION N/A 03/19/2016  ? Procedure: CARDIOVERSION;  Surgeon: Fay Records, MD;  Location: [facility];  Service: Cardiovascular;  Laterality: N/A;  ? CARPAL TUNNEL RELEASE    ? CHOLECYSTECTOMY  1975  ? CHOLECYSTECTOMY    ? COLONOSCOPY    ? COLONOSCOPY WITH PROPOFOL  12/29/2011  ? Procedure: COLONOSCOPY WITH PROPOFOL;  Surgeon: Lear Ng, MD;  Location: [facility];  Service: Endoscopy;  Laterality: N/A;  ? COLONOSCOPY WITH PROPOFOL N/A 05/24/2014  ? Procedure: COLONOSCOPY WITH PROPOFOL;  Surgeon: Wilford Corner, MD;  Location: [facility];  Service: Endoscopy;  Laterality: N/A;  ? ESOPHAGEAL MANOMETRY N/A 02/22/2017  ? Procedure: ESOPHAGEAL MANOMETRY (EM);  Surgeon: Wilford Corner, MD;  Location: [facility];  Service: Endoscopy;  Laterality: N/A;  ? ESOPHAGOGASTRODUODENOSCOPY    ? FOOT SURGERY    ? left, right  ? FRACTURE SURGERY    ? left leg-knee  ? HEEL SPUR EXCISION Bilateral   ? HOT HEMOSTASIS  12/29/2011  ? Procedure: HOT HEMOSTASIS (ARGON PLASMA COAGULATION/BICAP);  Surgeon: Lear Ng, MD;  Location: [facility];  Service: Endoscopy;  Laterality: N/A;  ? HOT HEMOSTASIS N/A 05/24/2014  ? Procedure: HOT HEMOSTASIS (ARGON PLASMA COAGULATION/BICAP);  Surgeon: Wilford Corner, MD;  Location: [facility];  Service: Endoscopy;  Laterality: N/A;  ? leg surgery d/t break Left   ? LUMBAR LAMINECTOMY/DECOMPRESSION MICRODISCECTOMY Left 04/05/2013  ? Procedure: LUMBAR FOUR TO FIVE LUMBAR LAMINECTOMY/DECOMPRESSION MICRODISCECTOMY 1 LEVEL;  Surgeon: Eustace Moore, MD;  Location: [facility];  Service: Neurosurgery;  Laterality: Left;  ? NISSEN FUNDOPLICATION    ? [place]  IMPEDANCE STUDY N/A 02/22/2017  ? Procedure: [place] IMPEDANCE STUDY;  Surgeon: Wilford Corner, MD;  Location: [facility];  Service: Endoscopy;  Laterality: N/A;  ? TEE WITHOUT CARDIOVERSION N/A 02/24/2016  ? Procedure: TRANSESOPHAGEAL ECHOCARDIOGRAM (TEE);  Surgeon: Fay Records, MD;  Location: [facility];  Service: Cardiovascular;  Laterality: N/A;  ? TOTAL KNEE ARTHROPLASTY Left 10/07/2016  ? TOTAL KNEE ARTHROPLASTY Left 10/07/2016  ? Procedure: TOTAL KNEE ARTHROPLASTY;  Surgeon: [name], MD;  Location: [facility];  Service: Orthopedics;  Laterality: Left;  ? ? ?Allergies ? ?Allergies  ?Allergen Reactions  ? Omnicef [Cefdinir] Swelling  ?  Tongue swelling  ?  Bacitracin   ?  Other reaction(s): Rash  ? Bacitracin-Polymyxin B   ?  Other reaction(s): Rash  ? Duloxetine Hcl   ?  Other reaction(s): felt funny- thinking messed up  ? Lexapro [Escitalopram]   ?  Makes her feel funny  ? Mirtazapine   ?  Other reaction(s): weight gain  ? Penicillins   ?  Pt cannot remember reaction she had as hadn't been on it in about 20 years but stated that she knows she is allergic to it.  ? Peroxide [Hydrogen Peroxide] Other (See Comments)  ?  Redness.   ? Zoloft [Sertraline Hcl]   ?  Makes her feel funny  ? Neosporin [Neomycin-Bacitracin Zn-Polymyx] Rash  ?  Blisters, itching.   ? ? ?History of Present Illness  ?  ?[name] [name] is a 76 y.o. female with a hx of atrial fibrillation, chronic anticoagulation, hypothyroidism, mitral regurgitation, hypertension, hyperlipidemia last seen 05/2020 by Dr. Marlou Porch. ? ?She was last seen 05/2020 noting stable exertional dyspnea and encouraged to continue to follow with pulmonology.  She was recommended for follow-up in 6 months.  She and her daughter contacted the office July 2022 noting significant decline in her health.  She was recommended for labs, chest x-ray, echocardiogram and to stop her amiodarone.  Thyroid, liver, potassium normal.  Kidney function slightly decreased from previous.  Hemoglobin was 10.7 and platelet 104 and recommended follow-up with GI as well as hematology.  Echo 07/2020 normal LVEF, mild MR, left atrium moderately dilated. ? ?ED visit 03/22/2021 with atrial for with RVR 154 bpm.  She was cardioverted in the ED without incidence returned to normal sinus rhythm. ? ?She has been referred to Dr. Leretha Pol of [facility] due to Dr. Gearldine Shown concern for MDS/MPD in setting of persistent pancytopenia with anorexia, weight loss. CT scasn with no adenopathy though CT noted to be enlarged. ? ?She presents today for follow-up independently. She lives with her husband, they have been married 54 years.  She is with me that she walked form  the kitchen to the den and her atrial fibrillation hit "all of a sudden". Her heart rate was 160 bpm.  Last episode of atrial fibrillation greater than 4 years ago.  She has been feeling well since cardioversion.  With exception of isolated episode of atrial fibrillation she reports no palpitations, chest pain.  Her exertional dyspnea is stable at baseline and she reports no orthopnea, PND, edema. ? ?EKGs/Labs/Other Studies Reviewed:  ? ?The following studies were reviewed today: ? ?Echo 07/2020 ? 1. Left ventricular ejection fraction, by estimation, is 60 to 65%. The  ?left ventricle has normal function. The left ventricle has no regional  ?wall motion abnormalities. Left ventricular diastolic parameters were  ?normal.  ? 2. Right ventricular systolic function is normal. The right ventricular  ?size is normal.  ?  3. Left atrial size was moderately dilated.  ? 4. The mitral valve is degenerative. Mild mitral valve regurgitation. No  ?evidence of mitral stenosis. Moderate mitral annular calcification.  ? 5. The aortic valve is normal in structure. Aortic valve regurgitation is  ?not visualized. No aortic stenosis is present.  ? 6. The inferior vena cava is normal in size with greater than 50%  ?respiratory variability, suggesting right atrial pressure of 3 mmHg.  ? ?:ECHO 11/11/2018: ? 1. Left ventricular ejection fraction, by visual estimation, is 60 to  ?65%. The left ventricle has normal function. There is no left ventricular  ?hypertrophy.  ? 2. Elevated left atrial pressure.  ? 3. Left ventricular diastolic parameters are consistent with Grade II  ?diastolic dysfunction (pseudonormalization). Very low A wave velocity  ?suggests recent atrial fibrillation and mechanical atrial stunning or  ?chronic left atrial failure.  ? 4. The left ventricle has no regional wall motion abnormalities.  ? 5. Global right ventricle has normal systolic function.The right  ?ventricular size is normal. No increase in right ventricular  wall  ?thickness.  ? 6. Left atrial size was mildly dilated.  ? 7. Right atrial size was mildly dilated.  ? 8. Mild mitral annular calcification.  ? 9. The mitral valve is normal in structure. Mil

## 2021-03-28 ENCOUNTER — Encounter (HOSPITAL_BASED_OUTPATIENT_CLINIC_OR_DEPARTMENT_OTHER): Payer: Self-pay

## 2021-03-28 DIAGNOSIS — I4819 Other persistent atrial fibrillation: Secondary | ICD-10-CM

## 2021-04-01 ENCOUNTER — Encounter: Payer: Self-pay | Admitting: Nurse Practitioner

## 2021-04-01 ENCOUNTER — Telehealth: Payer: Self-pay | Admitting: Nurse Practitioner

## 2021-04-01 ENCOUNTER — Other Ambulatory Visit: Payer: Self-pay

## 2021-04-01 ENCOUNTER — Ambulatory Visit: Payer: Medicare HMO | Admitting: Oncology

## 2021-04-01 ENCOUNTER — Inpatient Hospital Stay: Payer: Medicare HMO | Admitting: Nurse Practitioner

## 2021-04-01 ENCOUNTER — Ambulatory Visit (HOSPITAL_BASED_OUTPATIENT_CLINIC_OR_DEPARTMENT_OTHER)
Admission: RE | Admit: 2021-04-01 | Discharge: 2021-04-01 | Disposition: A | Discharge status: Home / Self Care | Payer: Medicare HMO | Source: Ambulatory Visit | Attending: Nurse Practitioner | Admitting: Nurse Practitioner

## 2021-04-01 ENCOUNTER — Inpatient Hospital Stay: Payer: Medicare HMO | Attending: Oncology

## 2021-04-01 ENCOUNTER — Inpatient Hospital Stay: Payer: Medicare HMO | Admitting: Oncology

## 2021-04-01 VITALS — BP 131/50 | HR 85 | Temp 98.2°F | Resp 20 | Ht 67.0 in | Wt 191.2 lb

## 2021-04-01 DIAGNOSIS — I2510 Atherosclerotic heart disease of native coronary artery without angina pectoris: Secondary | ICD-10-CM | POA: Insufficient documentation

## 2021-04-01 DIAGNOSIS — R0981 Nasal congestion: Secondary | ICD-10-CM | POA: Diagnosis not present

## 2021-04-01 DIAGNOSIS — R630 Anorexia: Secondary | ICD-10-CM | POA: Diagnosis not present

## 2021-04-01 DIAGNOSIS — R0609 Other forms of dyspnea: Secondary | ICD-10-CM | POA: Diagnosis not present

## 2021-04-01 DIAGNOSIS — R52 Pain, unspecified: Secondary | ICD-10-CM | POA: Diagnosis not present

## 2021-04-01 DIAGNOSIS — R059 Cough, unspecified: Secondary | ICD-10-CM | POA: Insufficient documentation

## 2021-04-01 DIAGNOSIS — D649 Anemia, unspecified: Secondary | ICD-10-CM | POA: Insufficient documentation

## 2021-04-01 DIAGNOSIS — G4730 Sleep apnea, unspecified: Secondary | ICD-10-CM | POA: Insufficient documentation

## 2021-04-01 DIAGNOSIS — N189 Chronic kidney disease, unspecified: Secondary | ICD-10-CM | POA: Insufficient documentation

## 2021-04-01 DIAGNOSIS — I129 Hypertensive chronic kidney disease with stage 1 through stage 4 chronic kidney disease, or unspecified chronic kidney disease: Secondary | ICD-10-CM | POA: Insufficient documentation

## 2021-04-01 DIAGNOSIS — R0600 Dyspnea, unspecified: Secondary | ICD-10-CM | POA: Diagnosis not present

## 2021-04-01 DIAGNOSIS — J029 Acute pharyngitis, unspecified: Secondary | ICD-10-CM | POA: Insufficient documentation

## 2021-04-01 DIAGNOSIS — R2681 Unsteadiness on feet: Secondary | ICD-10-CM | POA: Diagnosis not present

## 2021-04-01 DIAGNOSIS — I4891 Unspecified atrial fibrillation: Secondary | ICD-10-CM | POA: Diagnosis not present

## 2021-04-01 DIAGNOSIS — D539 Nutritional anemia, unspecified: Secondary | ICD-10-CM | POA: Diagnosis not present

## 2021-04-01 DIAGNOSIS — R634 Abnormal weight loss: Secondary | ICD-10-CM | POA: Insufficient documentation

## 2021-04-01 DIAGNOSIS — Z79899 Other long term (current) drug therapy: Secondary | ICD-10-CM | POA: Diagnosis not present

## 2021-04-01 DIAGNOSIS — D61818 Other pancytopenia: Secondary | ICD-10-CM | POA: Diagnosis not present

## 2021-04-01 DIAGNOSIS — R509 Fever, unspecified: Secondary | ICD-10-CM | POA: Diagnosis not present

## 2021-04-01 DIAGNOSIS — Z03818 Encounter for observation for suspected exposure to other biological agents ruled out: Secondary | ICD-10-CM | POA: Diagnosis not present

## 2021-04-01 LAB — CMP (CANCER CENTER ONLY)
ALT: 9 U/L (ref 0–44)
AST: 20 U/L (ref 15–41)
Albumin: 3.6 g/dL (ref 3.5–5.0)
Alkaline Phosphatase: 65 U/L (ref 38–126)
Anion gap: 5 (ref 5–15)
BUN: 14 mg/dL (ref 8–23)
CO2: 28 mmol/L (ref 22–32)
Calcium: 9.3 mg/dL (ref 8.9–10.3)
Chloride: 106 mmol/L (ref 98–111)
Creatinine: 1.46 mg/dL — ABNORMAL HIGH (ref 0.44–1.00)
GFR, Estimated: 37 mL/min — ABNORMAL LOW (ref >60)
Glucose, Bld: 98 mg/dL (ref 70–99)
Potassium: 4.7 mmol/L (ref 3.5–5.1)
Sodium: 139 mmol/L (ref 135–145)
Total Bilirubin: 0.6 mg/dL (ref 0.3–1.2)
Total Protein: 6.6 g/dL (ref 6.5–8.1)

## 2021-04-01 LAB — CBC WITH DIFFERENTIAL (CANCER CENTER ONLY)
Abs Immature Granulocytes: 0.01 10*3/uL (ref 0.00–0.07)
Basophils Absolute: 0 10*3/uL (ref 0.0–0.1)
Basophils Relative: 1 %
Eosinophils Absolute: 0.2 10*3/uL (ref 0.0–0.5)
Eosinophils Relative: 4 %
HCT: 33.4 % — ABNORMAL LOW (ref 36.0–46.0)
Hemoglobin: 10.4 g/dL — ABNORMAL LOW (ref 12.0–15.0)
Immature Granulocytes: 0 %
Lymphocytes Relative: 22 %
Lymphs Abs: 0.9 10*3/uL (ref 0.7–4.0)
MCH: 30.5 pg (ref 26.0–34.0)
MCHC: 31.1 g/dL (ref 30.0–36.0)
MCV: 97.9 fL (ref 80.0–100.0)
Monocytes Absolute: 0.7 10*3/uL (ref 0.1–1.0)
Monocytes Relative: 19 %
Neutro Abs: 2.2 10*3/uL (ref 1.7–7.7)
Neutrophils Relative %: 54 %
Platelet Count: 130 10*3/uL — ABNORMAL LOW (ref 150–400)
RBC: 3.41 MIL/uL — ABNORMAL LOW (ref 3.87–5.11)
RDW: 18.6 % — ABNORMAL HIGH (ref 11.5–15.5)
WBC Count: 4 10*3/uL (ref 4.0–10.5)
nRBC: 0 % (ref 0.0–0.2)

## 2021-04-01 LAB — LACTATE DEHYDROGENASE: LDH: 569 U/L — ABNORMAL HIGH (ref 98–192)

## 2021-04-01 NOTE — Progress Notes (Signed)
?  [facility] ?OFFICE PROGRESS NOTE ? ? ?Diagnosis: Pancytopenia ? ?INTERVAL HISTORY:  ? ?Ms. Mckercher returns as scheduled.  She saw Dr. Leretha Pol at [facility] 03/26/2021.  She was feeling well that day.  She was noted to be mildly anemic.  She was felt to possibly have low risk MDS.  Dr. Leretha Pol recommends repeating a bone marrow biopsy if her counts were to decrease or if symptoms returned/worsened. ? ?She is not feeling well at present.  For the past 2 days she has had a productive cough and sore throat.  She has mild dyspnea.  No fever.  No achiness.  She has not COVID tested. ? ?Objective: ? ?Vital signs in last 24 hours: ? ?Blood pressure (!) 131/50, pulse 85, temperature 98.2 ?F (36.8 ?C), temperature source Oral, resp. rate 20, height $RemoveBe'5\' 7"'fZRALfrjR$  (1.702 m), weight 191 lb 3.2 oz (86.7 kg), SpO2 95 %. ?  ? ?HEENT: No thrush or ulcers. ?Resp: Wheeze right lower lung field.  No respiratory distress. ?Cardio: Regular rate and rhythm. ?GI: No hepatosplenomegaly though she is coughing throughout the exam with tensing of abdominal muscles. ?Vascular: No leg edema. ? ? ?Lab Results: ? ?Lab Results  ?Component Value Date  ? WBC 4.0 04/01/2021  ? HGB 10.4 (L) 04/01/2021  ? HCT 33.4 (L) 04/01/2021  ? MCV 97.9 04/01/2021  ? PLT 130 (L) 04/01/2021  ? NEUTROABS 2.2 04/01/2021  ? ? ?Imaging: ? ?No results found. ? ?Medications: I have reviewed the patient's current medications. ? ?Assessment/Plan: ?Macrocytic anemia/thrombocytopenia ?08/13/2020 B12 3500 ?08/13/2020 LDH 612 ?08/13/2020 myeloma panel-no monoclonal protein  ?Bone marrow biopsy 08/20/2020-hypercellular marrow with erythroid hyperplasia and dyspoiesis, rare lipogranuloma like lesions.  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  Negative myeloma FISH panel, 46XX ?PNH screen - 08/26/2020 ?CTs 02/17/2021-new splenomegaly; diffuse bilateral bronchial wall thickening; clustered groundglass and fine nodular opacity in the dependent right lower lobe.   CAD. ?Fatigue ?Dyspnea on exertion ?Unsteady gait/balance disorder, followed by Dr. Tomi Likens ?CKD ?Atrial fibrillation ?Hypertension ?Sleep apnea ?Anorexia/weight loss ? ?Disposition: Ms. Baglio has stable mild anemia and thrombocytopenia, LDH remains elevated.  She saw Dr. Leretha Pol, possibly low risk MDS.  Recommendation to repeat a bone marrow biopsy if counts decrease or symptoms return/worsen. ? ?She presents today with a 2-day history of cough and sore throat, mild dyspnea.  We are obtaining a chest x-ray.  She will complete a COVID test and let us know the result.  She may be a candidate for Paxlovid. ? ?She will return for lab and follow-up in 4 weeks.  She will contact the office in the interim with any problems. ? ?Patient seen with Dr. Benay Spice ? ? ? ?Ned Card ANP/GNP-BC  ? ?04/01/2021  ?9:28 AM ? ?This was a shared visit with Ned Card.  Ms. Pfefferkorn was interviewed and examined.  She saw Dr. Leretha Pol 03/26/2021.  Review of the bone marrow biopsy and his clinical evaluation are consistent with low risk MDS.  He recommends a repeat bone marrow biopsy if she develops progressive cytopenias. ? ?We will refer her for a chest x-ray and she will obtain a COVID-19 test based on her symptoms today. ? ?She will return for an office visit in 1 month. ? ?I was present for greater than 50% of today's visit.  I performed medical decision making. ? ?[name], MD ? ? ? ? ? ? ? ?

## 2021-04-01 NOTE — Addendum Note (Signed)
Addended by: Gerald Stabs on: 04/01/2021 09:01 AM ? ? Modules accepted: Orders ? ?

## 2021-04-01 NOTE — Telephone Encounter (Signed)
I contacted [name] [name] to let her know the chest x-ray from earlier today was negative.  She completed a home COVID test which was negative.  She will continue to treat her symptoms.  She will contact the office if symptoms worsen. ?

## 2021-04-07 ENCOUNTER — Encounter (HOSPITAL_BASED_OUTPATIENT_CLINIC_OR_DEPARTMENT_OTHER): Payer: Self-pay | Admitting: Emergency Medicine

## 2021-04-07 ENCOUNTER — Emergency Department (HOSPITAL_BASED_OUTPATIENT_CLINIC_OR_DEPARTMENT_OTHER): Payer: Medicare HMO

## 2021-04-07 ENCOUNTER — Emergency Department (HOSPITAL_BASED_OUTPATIENT_CLINIC_OR_DEPARTMENT_OTHER)
Admission: EM | Admit: 2021-04-07 | Discharge: 2021-04-07 | Disposition: A | Discharge status: Home / Self Care | Payer: Medicare HMO | Attending: Emergency Medicine | Admitting: Emergency Medicine

## 2021-04-07 ENCOUNTER — Other Ambulatory Visit: Payer: Self-pay

## 2021-04-07 DIAGNOSIS — Z86010 Personal history of colonic polyps: Secondary | ICD-10-CM | POA: Insufficient documentation

## 2021-04-07 DIAGNOSIS — I129 Hypertensive chronic kidney disease with stage 1 through stage 4 chronic kidney disease, or unspecified chronic kidney disease: Secondary | ICD-10-CM | POA: Diagnosis not present

## 2021-04-07 DIAGNOSIS — N183 Chronic kidney disease, stage 3 unspecified: Secondary | ICD-10-CM | POA: Diagnosis not present

## 2021-04-07 DIAGNOSIS — I4891 Unspecified atrial fibrillation: Secondary | ICD-10-CM | POA: Insufficient documentation

## 2021-04-07 DIAGNOSIS — Z7901 Long term (current) use of anticoagulants: Secondary | ICD-10-CM | POA: Diagnosis not present

## 2021-04-07 DIAGNOSIS — Z79899 Other long term (current) drug therapy: Secondary | ICD-10-CM | POA: Diagnosis not present

## 2021-04-07 DIAGNOSIS — R002 Palpitations: Secondary | ICD-10-CM | POA: Diagnosis not present

## 2021-04-07 DIAGNOSIS — Z96652 Presence of left artificial knee joint: Secondary | ICD-10-CM | POA: Diagnosis not present

## 2021-04-07 LAB — CBC
HCT: 35.5 % — ABNORMAL LOW (ref 36.0–46.0)
Hemoglobin: 11.1 g/dL — ABNORMAL LOW (ref 12.0–15.0)
MCH: 30.3 pg (ref 26.0–34.0)
MCHC: 31.3 g/dL (ref 30.0–36.0)
MCV: 97 fL (ref 80.0–100.0)
Platelets: 156 10*3/uL (ref 150–400)
RBC: 3.66 MIL/uL — ABNORMAL LOW (ref 3.87–5.11)
RDW: 17.4 % — ABNORMAL HIGH (ref 11.5–15.5)
WBC: 4.4 10*3/uL (ref 4.0–10.5)
nRBC: 0 % (ref 0.0–0.2)

## 2021-04-07 LAB — COMPREHENSIVE METABOLIC PANEL
ALT: 9 U/L (ref 0–44)
AST: 19 U/L (ref 15–41)
Albumin: 3.7 g/dL (ref 3.5–5.0)
Alkaline Phosphatase: 51 U/L (ref 38–126)
Anion gap: 9 (ref 5–15)
BUN: 19 mg/dL (ref 8–23)
CO2: 25 mmol/L (ref 22–32)
Calcium: 9.3 mg/dL (ref 8.9–10.3)
Chloride: 107 mmol/L (ref 98–111)
Creatinine, Ser: 1.54 mg/dL — ABNORMAL HIGH (ref 0.44–1.00)
GFR, Estimated: 35 mL/min — ABNORMAL LOW (ref >60)
Glucose, Bld: 174 mg/dL — ABNORMAL HIGH (ref 70–99)
Potassium: 4 mmol/L (ref 3.5–5.1)
Sodium: 141 mmol/L (ref 135–145)
Total Bilirubin: 0.5 mg/dL (ref 0.3–1.2)
Total Protein: 7 g/dL (ref 6.5–8.1)

## 2021-04-07 LAB — MAGNESIUM: Magnesium: 1.9 mg/dL (ref 1.7–2.4)

## 2021-04-07 MED ORDER — ETOMIDATE 2 MG/ML IV SOLN
10.0000 mg | Freq: Once | INTRAVENOUS | Status: AC
Start: 2021-04-07 — End: 2021-04-07
  Administered 2021-04-07: 10 mg via INTRAVENOUS
  Filled 2021-04-07: qty 10

## 2021-04-07 MED ORDER — SODIUM CHLORIDE 0.9 % IV BOLUS
1000.0000 mL | Freq: Once | INTRAVENOUS | Status: AC
  Administered 2021-04-07: 1000 mL via INTRAVENOUS

## 2021-04-07 MED ORDER — SODIUM CHLORIDE 0.9 % IV BOLUS
500.0000 mL | Freq: Once | INTRAVENOUS | Status: AC
  Administered 2021-04-07: 500 mL via INTRAVENOUS

## 2021-04-07 NOTE — Sedation Documentation (Signed)
Pt alert, answering questions. Repositioned in bed, denies pain. NSR on the monitor. Will continue to assess.  ?

## 2021-04-07 NOTE — ED Triage Notes (Signed)
Pt arrives to ED with c/o palpitations. This started today around 230pm. Pt reports the palpitations are constant. She takes her HR at home and its ranged from 80-145bpm. Pt reports she has taken her Metoprolol 2.'5mg'$  at 330pm without resolution of her palpitations. She has hx of Afib with x2 hx of cardioversion.   ?

## 2021-04-07 NOTE — Sedation Documentation (Signed)
Cardiovert at 150 joules ?

## 2021-04-07 NOTE — ED Provider Notes (Signed)
?[facility] EMERGENCY DEPT ?Provider Note ? ? ?CSN: [id number] ?Arrival date & time: 04/07/21  1823 ? ?  ? ?History ? ?Chief Complaint  ?Patient presents with  ? Palpitations  ? ? ?[name] [name] is a 76 y.o. female. ? ?This is a 76 y.o. female with significant medical history as below, including atrial fibrillation, CKD, hypertension who presents to the ED with complaint of palpitations.  Onset around lunchtime today.  Heart rate noted to be greater than 150.  She took her home metoprolol which did not relieve her symptoms.  Palpitations, no chest pain dyspnea, lightheadedness, diaphoresis, nausea or vomiting.  She is having some malaise.  Similar to prior episodes of A-fib with RVR in the past.  She has been cardioverted 2 times.  Compliant with DOAC. ? ?Follows with Dr. Marlou Porch ? ? ?Past Medical History: ?No date: A-fib [facility]) ?    Comment:  paroxysmal ?No date: Anxiety ?No date: Arthritis ?No date: Cataracts, bilateral ?    Comment:  immature ?No date: CKD (chronic kidney disease) stage 3, GFR 30-59 ml/min (HCC) ?No date: Depression ?No date: GERD (gastroesophageal reflux disease) ?    Comment:  was on Nexium  ?No date: GERD (gastroesophageal reflux disease) ?No date: Hemorrhoid ?No date: History of bronchitis ?    Comment:  couple of yrs ago ?No date: History of colon polyps ?No date: History of peristent atrial fibrillation ?No date: Hyperlipidemia ?    Comment:  takes Pravastatin daily ?No date: Hypertension ?    Comment:  takes Amlodipine and Diovan daily ?No date: Hypertension ?No date: Pre-diabetes ?No date: Sleep apnea ?    Comment:  no cpap used ?No date: Sleep apnea ?    Comment:  study done 20+yrs ago and doesn't use cpap ?No date: Urinary urgency ?No date: Weakness ?    Comment:  in left leg r/t back ? ?Past Surgical History: ?1980: ABDOMINAL HYSTERECTOMY ?    Comment:  partial ?No date: ABDOMINAL HYSTERECTOMY ?    Comment:  partial ?No date: BACK SURGERY ?02/24/2016: CARDIOVERSION; N/A ?     Comment:  Procedure: CARDIOVERSION;  Surgeon: Fay Records, MD;   ?             Location: [place];  Service: Cardiovascular;   ?             Laterality: N/A; ?03/19/2016: CARDIOVERSION; N/A ?    Comment:  Procedure: CARDIOVERSION;  Surgeon: Fay Records, MD;   ?             Location: [place];  Service: Cardiovascular;   ?             Laterality: N/A; ?No date: CARPAL TUNNEL RELEASE ?1975: CHOLECYSTECTOMY ?No date: CHOLECYSTECTOMY ?No date: COLONOSCOPY ?12/29/2011: COLONOSCOPY WITH PROPOFOL ?    Comment:  Procedure: COLONOSCOPY WITH PROPOFOL;  Surgeon: Evette Doffing  ?             Aloha Gell, MD;  Location: [facility];  Service:  ?             Endoscopy;  Laterality: N/A; ?05/24/2014: COLONOSCOPY WITH PROPOFOL; N/A ?    Comment:  Procedure: COLONOSCOPY WITH PROPOFOL;  Surgeon: Evette Doffing  ?             Michail Sermon, MD;  Location: [facility];  Service:  ?             Endoscopy;  Laterality: N/A; ?02/22/2017: ESOPHAGEAL MANOMETRY; N/A ?  Comment:  Procedure: ESOPHAGEAL MANOMETRY (EM);  Surgeon:  ?             Wilford Corner, MD;  Location: [facility];  Service: ?             Endoscopy;  Laterality: N/A; ?No date: ESOPHAGOGASTRODUODENOSCOPY ?No date: FOOT SURGERY ?    Comment:  left, right ?No date: FRACTURE SURGERY ?    Comment:  left leg-knee ?No date: HEEL SPUR EXCISION; Bilateral ?12/29/2011: HOT HEMOSTASIS ?    Comment:  Procedure: HOT HEMOSTASIS (ARGON PLASMA  ?             COAGULATION/BICAP);  Surgeon: Lear Ng, MD;   ?             Location: [facility];  Service: Endoscopy;  Laterality: ?             N/A; ?05/24/2014: HOT HEMOSTASIS; N/A ?    Comment:  Procedure: HOT HEMOSTASIS (ARGON PLASMA  ?             COAGULATION/BICAP);  Surgeon: Wilford Corner, MD;   ?             Location: [facility];  Service: Endoscopy;  Laterality: ?             N/A; ?No date: leg surgery d/t break; Left ?04/05/2013: LUMBAR LAMINECTOMY/DECOMPRESSION MICRODISCECTOMY; Left ?    Comment:  Procedure: LUMBAR FOUR TO  FIVE LUMBAR  ?             LAMINECTOMY/DECOMPRESSION MICRODISCECTOMY 1 LEVEL;   ?             Surgeon: Eustace Moore, MD;  Location: [facility];   ?             Service: Neurosurgery;  Laterality: Left; ?No date: [place]ISSEN FUNDOPLICATION ?1/96/2229: [place] IMPEDANCE STUDY; N/A ?    Comment:  Procedure: [place] IMPEDANCE STUDY;  Surgeon: Michail Sermon,  ?             Evette Doffing, MD;  Location: [facility];  Service:  ?             Endoscopy;  Laterality: N/A; ?02/24/2016: TEE WITHOUT CARDIOVERSION; N/A ?    Comment:  Procedure: TRANSESOPHAGEAL ECHOCARDIOGRAM (TEE);   ?             Surgeon: Fay Records, MD;  Location: [facility];   ?             Service: Cardiovascular;  Laterality: N/A; ?10/07/2016: TOTAL KNEE ARTHROPLASTY; Left ?10/07/2016: TOTAL KNEE ARTHROPLASTY; Left ?    Comment:  Procedure: TOTAL KNEE ARTHROPLASTY;  Surgeon: Percell Miller,  ?             Nestor Ramp, MD;  Location: [place];  Service: Orthopedics;   ?             Laterality: Left;  ? ? ?The history is provided by the patient and the spouse. No language interpreter was used.  ?Palpitations ?Associated symptoms: no chest pain, no cough, no nausea, no shortness of breath and no vomiting   ? ?  ? ?Home Medications ?Prior to Admission medications   ?Medication Sig Start Date End Date Taking? Authorizing Provider  ?albuterol (VENTOLIN HFA) 108 (90 Base) MCG/ACT inhaler Inhale 1-2 puffs into the lungs every 4 (four) hours as needed for wheezing or shortness of breath. 07/22/18   Parrett, Fonnie Mu, NP  ?ALPRAZolam (XANAX) 0.5 MG tablet Take 1 tablet (  0.5 mg total) by mouth 3 (three) times daily as needed for anxiety. 03/11/21   Owens Shark, NP  ?amLODipine (NORVASC) 2.5 MG tablet Take 2.5 mg by mouth daily. 02/21/21   [provider]  ?apixaban (ELIQUIS) 5 MG TABS tablet Take 1 tablet (5 mg total) by mouth 2 (two) times daily. 03/27/21 03/22/22  Loel Dubonnet, NP  ?Ascorbic Acid (VITAMIN C WITH ROSE HIPS) 1000 MG tablet Take 1,000 mg by mouth daily.    [provider]  ?benzonatate (TESSALON) 200 MG capsule Take 1 capsule by mouth three times daily as needed for cough 05/22/19   Parrett, Fonnie Mu, NP  ?Biotin 5 MG TBDP Take by mouth.    [provider]  ?bisacodyl (DULCOLAX) 5 MG EC tablet See admin instructions. 09/11/20   [provider]  ?budesonide-formoterol (SYMBICORT) 160-4.5 MCG/ACT inhaler Inhale into the lungs.    [provider]  ?buPROPion (WELLBUTRIN SR) 200 MG 12 hr tablet Take 400 mg by mouth daily.    [provider]  ?calcium carbonate (OSCAL) 1500 (600 Ca) MG TABS tablet Take 1 tablet by mouth daily with breakfast.    [provider]  ?CALCIUM PO Take 1 tablet by mouth every morning.    [provider]  ?Cholecalciferol (VITAMIN D) 2000 UNITS tablet Take 4,000 Units by mouth daily.    [provider]  ?CVS MELATONIN PO Take 1 tablet by mouth at bedtime as needed.    [provider]  ?fexofenadine (ALLEGRA) 180 MG tablet Take 180 mg by mouth daily as needed for allergies or rhinitis.    [provider]  ?furosemide (LASIX) 20 MG tablet TAKE 1 TABLET EVERY DAY 01/29/21   Jerline Pain, MD  ?levothyroxine (SYNTHROID) 75 MCG tablet TAKE 1 TABLET DAILY BEFORE BREAKFAST (NEED APPOINTMENT FOR FURTHER REFILLS) 01/15/21   Renato Shin, MD  ?metoprolol succinate (TOPROL-XL) 25 MG 24 hr tablet TAKE 1/2 TABLET ONE TIME DAILY 01/29/21   Jerline Pain, MD  ?mirtazapine (REMERON) 15 MG tablet Take 15 mg by mouth at bedtime. ?Patient not taking: Reported on 04/01/2021 03/13/21   [provider]  ?potassium chloride SA (KLOR-CON M) 20 MEQ tablet TAKE 1 TABLET EVERY DAY 01/29/21   Jerline Pain, MD  ?pravastatin (PRAVACHOL) 40 MG tablet Take 40 mg by mouth at bedtime.    [provider]  ?saccharomyces boulardii (FLORASTOR) 250 MG capsule Take by mouth.    [provider]  ?Spacer/Aero-Holding Chambers (AEROCHAMBER MV) inhaler Use as instructed 10/13/18   Deneise Lever, MD  ?Turmeric (QC TUMERIC COMPLEX PO) once daily Herbal Name: Tumeric    [provider]  ?   ? ?Allergies    ?Omnicef [cefdinir], Bacitracin, Bacitracin-polymyxin b, Duloxetine hcl, Lexapro [employer]	[place]

## 2021-04-07 NOTE — Discharge Instructions (Signed)
It was a pleasure caring for you today in the emergency department. ° °Please return to the emergency department for any worsening or worrisome symptoms. ° ° °

## 2021-04-07 NOTE — ED Notes (Signed)
Pt A&Ox4. Resting comfortably at this time. Vital signs stable and will continue to be monitored. Husband at bedside. Call bell within reach ?

## 2021-04-08 ENCOUNTER — Telehealth: Payer: Self-pay | Admitting: Cardiology

## 2021-04-08 DIAGNOSIS — F419 Anxiety disorder, unspecified: Secondary | ICD-10-CM | POA: Diagnosis not present

## 2021-04-08 DIAGNOSIS — I480 Paroxysmal atrial fibrillation: Secondary | ICD-10-CM | POA: Diagnosis not present

## 2021-04-08 DIAGNOSIS — F331 Major depressive disorder, recurrent, moderate: Secondary | ICD-10-CM | POA: Diagnosis not present

## 2021-04-08 DIAGNOSIS — R053 Chronic cough: Secondary | ICD-10-CM | POA: Diagnosis not present

## 2021-04-08 NOTE — Telephone Encounter (Signed)
Patient c/o Palpitations:  High priority if patient c/o lightheadedness, shortness of breath, or chest pain ? ?How long have you had palpitations/irregular HR/ Afib? Are you having the symptoms now?  ?Patient states last night she went back into afib and went to the hospital. She is no longer in afib, but they advised her to follow up with cardiology. We scheduled her for 4/06 with Laurann Montana, NP at [facility]. ? ?Are you currently experiencing lightheadedness, SOB or CP?  ?No  ? ?Do you have a history of afib (atrial fibrillation) or irregular heart rhythm?  ?Yes  ? ?Have you checked your BP or HR? (document readings if available):  ?-HR got up to 146 last night/70 this morning ?-BP has been low,104/46 last night/104/45 this morning ? ?Are you experiencing any other symptoms?  ?No  ? ?

## 2021-04-08 NOTE — Telephone Encounter (Signed)
Called pt to discuss low BPs.  Was going to offer the open spot on [employer] schedule tomorrow at 8am if still available.  ?

## 2021-04-11 NOTE — Telephone Encounter (Signed)
Left message for patient that we were calling to check on her and make sure she is feeling okay.  Asked that she call back if any concerns.  Adv on message of appointment next [facility] with [place]. ?

## 2021-04-16 ENCOUNTER — Ambulatory Visit: Payer: Medicare HMO | Admitting: Endocrinology

## 2021-04-16 VITALS — BP 102/66 | HR 55 | Ht 67.0 in | Wt 186.0 lb

## 2021-04-16 DIAGNOSIS — E032 Hypothyroidism due to medicaments and other exogenous substances: Secondary | ICD-10-CM

## 2021-04-16 LAB — TSH: TSH: 0.39 u[IU]/mL (ref 0.35–5.50)

## 2021-04-16 LAB — T4, FREE: Free T4: 2.02 ng/dL — ABNORMAL HIGH (ref 0.60–1.60)

## 2021-04-16 NOTE — Progress Notes (Signed)
? ?Subjective:  ? ? Patient ID: [name] [name], female    DOB: Jan 18, 1945, 76 y.o.   MRN: [id number] ? ?HPI ?Pt returns for f/u of hypothyroidism (she took amiodarone 2018-2022; hypothyroidism was dx'ed in late 2019, when she was rx'ed synthroid; she has never had thyroid imaging).  She takes synthroid as rx'ed.  pt states she feels well in general.   ?Past Medical History:  ?Diagnosis Date  ? A-fib ([place])   ? paroxysmal  ? Anxiety   ? Arthritis   ? Cataracts, bilateral   ? immature  ? CKD (chronic kidney disease) stage 3, GFR 30-59 ml/min (HCC)   ? Depression   ? GERD (gastroesophageal reflux disease)   ? was on Nexium   ? GERD (gastroesophageal reflux disease)   ? Hemorrhoid   ? History of bronchitis   ? couple of yrs ago  ? History of colon polyps   ? History of peristent atrial fibrillation   ? Hyperlipidemia   ? takes Pravastatin daily  ? Hypertension   ? takes Amlodipine and Diovan daily  ? Hypertension   ? Pre-diabetes   ? Sleep apnea   ? no cpap used  ? Sleep apnea   ? study done 20+yrs ago and doesn't use cpap  ? Urinary urgency   ? Weakness   ? in left leg r/t back  ? ? ?Past Surgical History:  ?Procedure Laterality Date  ? ABDOMINAL HYSTERECTOMY  1980  ? partial  ? ABDOMINAL HYSTERECTOMY    ? partial  ? BACK SURGERY    ? CARDIOVERSION N/A 02/24/2016  ? Procedure: CARDIOVERSION;  Surgeon: Fay Records, MD;  Location: [place];  Service: Cardiovascular;  Laterality: N/A;  ? CARDIOVERSION N/A 03/19/2016  ? Procedure: CARDIOVERSION;  Surgeon: Fay Records, MD;  Location: [place];  Service: Cardiovascular;  Laterality: N/A;  ? CARPAL TUNNEL RELEASE    ? CHOLECYSTECTOMY  1975  ? CHOLECYSTECTOMY    ? COLONOSCOPY    ? COLONOSCOPY WITH PROPOFOL  12/29/2011  ? Procedure: COLONOSCOPY WITH PROPOFOL;  Surgeon: Lear Ng, MD;  Location: [facility];  Service: Endoscopy;  Laterality: N/A;  ? COLONOSCOPY WITH PROPOFOL N/A 05/24/2014  ? Procedure: COLONOSCOPY WITH PROPOFOL;  Surgeon: Wilford Corner, MD;   Location: [facility];  Service: Endoscopy;  Laterality: N/A;  ? ESOPHAGEAL MANOMETRY N/A 02/22/2017  ? Procedure: ESOPHAGEAL MANOMETRY (EM);  Surgeon: Wilford Corner, MD;  [facility];  Service: Endoscopy;  Laterality: N/A;  ? ESOPHAGOGASTRODUODENOSCOPY    ? FOOT SURGERY    ? left, right  ? FRACTURE SURGERY    ? left leg-knee  ? HEEL SPUR EXCISION Bilateral   ? HOT HEMOSTASIS  12/29/2011  ? Procedure: HOT HEMOSTASIS (ARGON PLASMA COAGULATION/BICAP);  Surgeon: Lear Ng, MD;  Location: [facility];  Service: Endoscopy;  Laterality: N/A;  ? HOT HEMOSTASIS N/A 05/24/2014  ? Procedure: HOT HEMOSTASIS (ARGON PLASMA COAGULATION/BICAP);  Surgeon: Wilford Corner, MD;  Location: [facility];  Service: Endoscopy;  Laterality: N/A;  ? leg surgery d/t break Left   ? LUMBAR LAMINECTOMY/DECOMPRESSION MICRODISCECTOMY Left 04/05/2013  ? Procedure: LUMBAR FOUR TO FIVE LUMBAR LAMINECTOMY/DECOMPRESSION MICRODISCECTOMY 1 LEVEL;  Surgeon: Eustace Moore, MD;  Location: [place] [facility];  Service: Neurosurgery;  Laterality: Left;  ? NISSEN FUNDOPLICATION    ? [place] IMPEDANCE STUDY N/A 02/22/2017  ? Procedure: [place] IMPEDANCE STUDY;  Surgeon: Wilford Corner, MD;  [facility];  Service: Endoscopy;  Laterality: N/A;  ? TEE WITHOUT CARDIOVERSION N/A  02/24/2016  ? Procedure: TRANSESOPHAGEAL ECHOCARDIOGRAM (TEE);  Surgeon: Fay Records, MD;  Location: [place];  Service: Cardiovascular;  Laterality: N/A;  ? TOTAL KNEE ARTHROPLASTY Left 10/07/2016  ? TOTAL KNEE ARTHROPLASTY Left 10/07/2016  ? Procedure: TOTAL KNEE ARTHROPLASTY;  Surgeon: [name], MD;  Location: [place];  Service: Orthopedics;  Laterality: Left;  ? ? ?Social History  ? ?Socioeconomic History  ? Marital status: Married  ?  Spouse name: Dominica Severin  ? Number of children: 2  ? Years of education: 29  ? Highest education level: Some college, no degree  ?Occupational History  ? Occupation: retired  ?Tobacco Use  ? Smoking status: Never  ? Smokeless  tobacco: Never  ?Vaping Use  ? Vaping Use: Never used  ?Substance and Sexual Activity  ? Alcohol use: No  ? Drug use: No  ? Sexual activity: Yes  ?  Birth control/protection: Surgical  ?Other Topics Concern  ? Not on file  ?Social History Narrative  ? Right handed  ? Drinks caffeine  ? Two story home  ? ?Social Determinants of Health  ? ?Financial Resource Strain: Not on file  ?Food Insecurity: Not on file  ?Transportation Needs: Not on file  ?Physical Activity: Not on file  ?Stress: Not on file  ?Social Connections: Not on file  ?Intimate Partner Violence: Not on file  ? ? ?Current Outpatient Medications on File Prior to Visit  ?Medication Sig Dispense Refill  ? albuterol (VENTOLIN HFA) 108 (90 Base) MCG/ACT inhaler Inhale 1-2 puffs into the lungs every 4 (four) hours as needed for wheezing or shortness of breath. 1 g 1  ? ALPRAZolam (XANAX) 0.5 MG tablet Take 1 tablet (0.5 mg total) by mouth 3 (three) times daily as needed for anxiety. 40 tablet 0  ? amLODipine (NORVASC) 2.5 MG tablet Take 2.5 mg by mouth daily.    ? apixaban (ELIQUIS) 5 MG TABS tablet Take 1 tablet (5 mg total) by mouth 2 (two) times daily. 180 tablet 3  ? Ascorbic Acid (VITAMIN C WITH ROSE HIPS) 1000 MG tablet Take 1,000 mg by mouth daily.    ? benzonatate (TESSALON) 200 MG capsule Take 1 capsule by mouth three times daily as needed for cough 60 capsule 0  ? Biotin 5 MG TBDP Take by mouth.    ? bisacodyl (DULCOLAX) 5 MG EC tablet See admin instructions.    ? budesonide-formoterol (SYMBICORT) 160-4.5 MCG/ACT inhaler Inhale into the lungs.    ? buPROPion (WELLBUTRIN SR) 200 MG 12 hr tablet Take 400 mg by mouth daily.    ? calcium carbonate (OSCAL) 1500 (600 Ca) MG TABS tablet Take 1 tablet by mouth daily with breakfast.    ? CALCIUM PO Take 1 tablet by mouth every morning.    ? Cholecalciferol (VITAMIN D) 2000 UNITS tablet Take 4,000 Units by mouth daily.    ? [employer]S MELATONIN PO Take 1 tablet by mouth at bedtime as needed.    ? fexofenadine  (ALLEGRA) 180 MG tablet Take 180 mg by mouth daily as needed for allergies or rhinitis.    ? furosemide (LASIX) 20 MG tablet TAKE 1 TABLET EVERY DAY 90 tablet 3  ? levothyroxine (SYNTHROID) 75 MCG tablet TAKE 1 TABLET DAILY BEFORE BREAKFAST (NEED APPOINTMENT FOR FURTHER REFILLS) 90 tablet 0  ? metoprolol succinate (TOPROL-XL) 25 MG 24 hr tablet TAKE 1/2 TABLET ONE TIME DAILY 45 tablet 3  ? potassium chloride SA (KLOR-CON M) 20 MEQ tablet TAKE 1 TABLET EVERY DAY 90 tablet  2  ? pravastatin (PRAVACHOL) 40 MG tablet Take 40 mg by mouth at bedtime.    ? Spacer/Aero-Holding Chambers (AEROCHAMBER MV) inhaler Use as instructed 1 each 0  ? Turmeric (QC TUMERIC COMPLEX PO) once daily Herbal Name: Tumeric    ? ?No current facility-administered medications on file prior to visit.  ? ? ?Allergies  ?Allergen Reactions  ? Omnicef [Cefdinir] Swelling  ?  Tongue swelling  ? Bacitracin   ?  Other reaction(s): Rash  ? Bacitracin-Polymyxin B   ?  Other reaction(s): Rash  ? Duloxetine Hcl   ?  Other reaction(s): felt funny- thinking messed up  ? Lexapro [Escitalopram]   ?  Makes her feel funny  ? Mirtazapine   ?  Other reaction(s): weight gain  ? Penicillins   ?  Pt cannot remember reaction she had as hadn't been on it in about 20 years but stated that she knows she is allergic to it.  ? Peroxide [Hydrogen Peroxide] Other (See Comments)  ?  Redness.   ? Zoloft [Sertraline Hcl]   ?  Makes her feel funny  ? Neosporin [Neomycin-Bacitracin Zn-Polymyx] Rash  ?  Blisters, itching.   ? ? ?Family History  ?Problem Relation Age of Onset  ? Colon cancer Mother   ? Heart disease Father   ? Breast cancer Neg Hx   ? Thyroid disease Neg Hx   ? ? ?BP 102/66 (BP Location: Left Arm, Patient Position: Sitting, Cuff Size: Normal)   Pulse (!) 55   Ht '5\' 7"'$  (1.702 m)   Wt 186 lb (84.4 kg)   SpO2 96%   BMI 29.13 kg/m?  ? ? ?Review of Systems ? ?   ?Objective:  ? Physical Exam ?VITAL SIGNS:  See vs page ?GENERAL: no distress ?NECK: There is no palpable  thyroid enlargement.  No thyroid nodule is palpable.  No palpable lymphadenopathy at the anterior neck.   ? ? ?Lab Results  ?Component Value Date  ? TSH 0.39 04/16/2021  ? ?   ?Assessment & Plan:  ?Hypothyroidism: well-co

## 2021-04-16 NOTE — Patient Instructions (Signed)
Blood tests are requested for you today.  We'll let you know about the results.   ?It is best to never miss the medication.  However, if you do miss it, next best is to double up the next time.   ?Please come back for a follow-up appointment in 6 months.   ?

## 2021-04-17 ENCOUNTER — Ambulatory Visit (HOSPITAL_BASED_OUTPATIENT_CLINIC_OR_DEPARTMENT_OTHER): Payer: Medicare HMO | Admitting: Family

## 2021-04-17 ENCOUNTER — Encounter (HOSPITAL_BASED_OUTPATIENT_CLINIC_OR_DEPARTMENT_OTHER): Payer: Self-pay | Admitting: Family

## 2021-04-17 VITALS — BP 115/60 | HR 69 | Ht 68.0 in | Wt 186.4 lb

## 2021-04-17 DIAGNOSIS — I4819 Other persistent atrial fibrillation: Secondary | ICD-10-CM

## 2021-04-17 DIAGNOSIS — D6859 Other primary thrombophilia: Secondary | ICD-10-CM

## 2021-04-17 DIAGNOSIS — E782 Mixed hyperlipidemia: Secondary | ICD-10-CM

## 2021-04-17 DIAGNOSIS — I10 Essential (primary) hypertension: Secondary | ICD-10-CM

## 2021-04-17 DIAGNOSIS — I340 Nonrheumatic mitral (valve) insufficiency: Secondary | ICD-10-CM | POA: Diagnosis not present

## 2021-04-17 NOTE — Progress Notes (Deleted)
amiodarone ?[place] ? ?

## 2021-04-17 NOTE — Patient Instructions (Signed)
Medication Instructions:  ?Continue your current medications.  ? ?*If you need a refill on your cardiac medications before your next appointment, please call your pharmacy* ? ? ?Lab Work: ?None ordered today.  ? ?Testing/Procedures: ?None ordered today.  ? ?Follow-Up: ?At [facility], you and your health needs are our priority.  As part of our continuing mission to provide you with exceptional heart care, we have created designated Provider Care Teams.  These Care Teams include your primary Cardiologist (physician) and Advanced Practice Providers (APPs -  Physician Assistants and Nurse Practitioners) who all work together to provide you with the care you need, when you need it. ? ?We recommend signing up for the patient portal called "MyChart".  Sign up information is provided on this After Visit Summary.  MyChart is used to connect with patients for Virtual Visits (Telemedicine).  Patients are able to view lab/test results, encounter notes, upcoming appointments, etc.  Non-urgent messages can be sent to your provider as well.   ?To learn more about what you can do with MyChart, go to [email or web address].   ? ?Your next appointment:   ?As scheduled with Dr. Quentin Ore and Dr. Marlou Porch ? ? ?Other Instructions ? ?Upcoming appointment with Dr. Quentin Ore to discuss Watchman and possible additional medications for atrial fibrillation  ?

## 2021-04-17 NOTE — Progress Notes (Signed)
? ?Office Visit  ?  ?Patient Name: [name] [name] ?Date of Encounter: 04/17/2021 ? ?PCP:  Harlan Stains, MD ?  ?[place]  ?Cardiologist:  Candee Furbish, MD  ?Advanced Practice Provider:  No care team member to display ?Electrophysiologist:  None  ?   ? ?Chief Complaint  ?  ?[name] [name] is a 76 y.o. female with a hx of atrial fibrillation, chronic anticoagulation, hypothyroidism, mitral regurgitation, hypertension, hyperlipidemia  presents today for follow up of ED visit for atrial fibrillation.   ? ?Past Medical History  ?  ?Past Medical History:  ?Diagnosis Date  ? A-fib ([place])   ? paroxysmal  ? Anxiety   ? Arthritis   ? Cataracts, bilateral   ? immature  ? CKD (chronic kidney disease) stage 3, GFR 30-59 ml/min (HCC)   ? Depression   ? GERD (gastroesophageal reflux disease)   ? was on Nexium   ? GERD (gastroesophageal reflux disease)   ? Hemorrhoid   ? History of bronchitis   ? couple of yrs ago  ? History of colon polyps   ? History of peristent atrial fibrillation   ? Hyperlipidemia   ? takes Pravastatin daily  ? Hypertension   ? takes Amlodipine and Diovan daily  ? Hypertension   ? Pre-diabetes   ? Sleep apnea   ? no cpap used  ? Sleep apnea   ? study done 20+yrs ago and doesn't use cpap  ? Urinary urgency   ? Weakness   ? in left leg r/t back  ? ?Past Surgical History:  ?Procedure Laterality Date  ? ABDOMINAL HYSTERECTOMY  1980  ? partial  ? ABDOMINAL HYSTERECTOMY    ? partial  ? BACK SURGERY    ? CARDIOVERSION N/A 02/24/2016  ? Procedure: CARDIOVERSION;  Surgeon: Fay Records, MD;  Location: [place];  Service: Cardiovascular;  Laterality: N/A;  ? CARDIOVERSION N/A 03/19/2016  ? Procedure: CARDIOVERSION;  Surgeon: Fay Records, MD;  Location: [place];  Service: Cardiovascular;  Laterality: N/A;  ? CARPAL TUNNEL RELEASE    ? CHOLECYSTECTOMY  1975  ? CHOLECYSTECTOMY    ? COLONOSCOPY    ? COLONOSCOPY WITH PROPOFOL  12/29/2011  ? Procedure: COLONOSCOPY WITH PROPOFOL;  Surgeon:  Lear Ng, MD;  [facility];  Service: Endoscopy;  Laterality: N/A;  ? COLONOSCOPY WITH PROPOFOL N/A 05/24/2014  ? Procedure: COLONOSCOPY WITH PROPOFOL;  Surgeon: Wilford Corner, MD;  [facility];  Service: Endoscopy;  Laterality: N/A;  ? ESOPHAGEAL MANOMETRY N/A 02/22/2017  ? Procedure: ESOPHAGEAL MANOMETRY (EM);  Surgeon: Wilford Corner, MD;  [facility];  Service: Endoscopy;  Laterality: N/A;  ? ESOPHAGOGASTRODUODENOSCOPY    ? FOOT SURGERY    ? left, right  ? FRACTURE SURGERY    ? left leg-knee  ? HEEL SPUR EXCISION Bilateral   ? HOT HEMOSTASIS  12/29/2011  ? Procedure: HOT HEMOSTASIS (ARGON PLASMA COAGULATION/BICAP);  Surgeon: Lear Ng, MD;  Location: [facility];  Service: Endoscopy;  Laterality: N/A;  ? HOT HEMOSTASIS N/A 05/24/2014  ? Procedure: HOT HEMOSTASIS (ARGON PLASMA COAGULATION/BICAP);  Surgeon: Wilford Corner, MD;  Location: [facility];  Service: Endoscopy;  Laterality: N/A;  ? leg surgery d/t break Left   ? LUMBAR LAMINECTOMY/DECOMPRESSION MICRODISCECTOMY Left 04/05/2013  ? Procedure: LUMBAR FOUR TO FIVE LUMBAR LAMINECTOMY/DECOMPRESSION MICRODISCECTOMY 1 LEVEL;  Surgeon: Eustace Moore, MD;  Location: [place] [facility];  Service: Neurosurgery;  Laterality: Left;  ? NISSEN FUNDOPLICATION    ?  [place] IMPEDANCE STUDY N/A 02/22/2017  ? Procedure: [place] IMPEDANCE STUDY;  Surgeon: Wilford Corner, MD;  [facility];  Service: Endoscopy;  Laterality: N/A;  ? TEE WITHOUT CARDIOVERSION N/A 02/24/2016  ? Procedure: TRANSESOPHAGEAL ECHOCARDIOGRAM (TEE);  Surgeon: Fay Records, MD;  Location: [place];  Service: Cardiovascular;  Laterality: N/A;  ? TOTAL KNEE ARTHROPLASTY Left 10/07/2016  ? TOTAL KNEE ARTHROPLASTY Left 10/07/2016  ? Procedure: TOTAL KNEE ARTHROPLASTY;  Surgeon: [name], MD;  Location: [place];  Service: Orthopedics;  Laterality: Left;  ? ? ?Allergies ? ?Allergies  ?Allergen Reactions  ? Omnicef [Cefdinir] Swelling  ?  Tongue  swelling  ? Bacitracin   ?  Other reaction(s): Rash  ? Bacitracin-Polymyxin B   ?  Other reaction(s): Rash  ? Duloxetine Hcl   ?  Other reaction(s): felt funny- thinking messed up  ? Lexapro [Escitalopram]   ?  Makes her feel funny  ? Mirtazapine   ?  Other reaction(s): weight gain  ? Penicillins   ?  Pt cannot remember reaction she had as hadn't been on it in about 20 years but stated that she knows she is allergic to it.  ? Peroxide [Hydrogen Peroxide] Other (See Comments)  ?  Redness.   ? Zoloft [Sertraline Hcl]   ?  Makes her feel funny  ? Neosporin [Neomycin-Bacitracin Zn-Polymyx] Rash  ?  Blisters, itching.   ? ? ?History of Present Illness  ?  ?[name] [name] is a 76 y.o. female with a hx of atrial fibrillation, chronic anticoagulation, hypothyroidism, mitral regurgitation, hypertension, hyperlipidemia last seen 05/2020 by Dr. Marlou Porch. ? ?She was last seen 05/2020 noting stable exertional dyspnea and encouraged to continue to follow with pulmonology.  She was recommended for follow-up in 6 months.  She and her daughter contacted the office July 2022 noting significant decline in her health.  She was recommended for labs, chest x-ray, echocardiogram and to stop her amiodarone.  Thyroid, liver, potassium normal.  Kidney function slightly decreased from previous.  Hemoglobin was 10.7 and platelet 104 and recommended follow-up with GI as well as hematology.  Echo 07/2020 normal LVEF, mild MR, left atrium moderately dilated. ? ?Following with Dr. Benay Spice, hematology,and has been referred to both [facility] and [facility] hematology for concern of MDS/MPD insetting of persistent pancytopenia with anorexia, weight loss. CT scasn with no adenopathy though CT noted to be enlarged. ? ?ED visit 03/22/21 and 04/07/21 with atrial fibrillation requiring cardioversion. Prior to this last episode atrial fibrillation 4 years ago. She was seen 03/27/21 and referred to EP for consideration of Watchman given pancytopenia and need for  anticoagulation.  ? ?Very pleasant lady who presents today for follow-up independently. She lives with her husband, they have been married 55 years.  No recurrent atrial fibrillation since ED visit. Her exertional dyspnea is stable at baseline and she reports no orthopnea, PND, edema, chest pain. Appointment to establish with EP tomorrow.  ? ?EKGs/Labs/Other Studies Reviewed:  ? ?The following studies were reviewed today: ? ?Echo 07/2020 ? 1. Left ventricular ejection fraction, by estimation, is 60 to 65%. The  ?left ventricle has normal function. The left ventricle has no regional  ?wall motion abnormalities. Left ventricular diastolic parameters were  ?normal.  ? 2. Right ventricular systolic function is normal. The right ventricular  ?size is normal.  ? 3. Left atrial size was moderately dilated.  ? 4. The mitral valve is degenerative. Mild mitral valve regurgitation. No  ?evidence of mitral stenosis. Moderate  mitral annular calcification.  ? 5. The aortic valve is normal in structure. Aortic valve regurgitation is  ?not visualized. No aortic stenosis is present.  ? 6. The inferior vena cava is normal in size with greater than 50%  ?respiratory variability, suggesting right atrial pressure of 3 mmHg.  ? ?:ECHO 11/11/2018: ? 1. Left ventricular ejection fraction, by visual estimation, is 60 to  ?65%. The left ventricle has normal function. There is no left ventricular  ?hypertrophy.  ? 2. Elevated left atrial pressure.  ? 3. Left ventricular diastolic parameters are consistent with Grade II  ?diastolic dysfunction (pseudonormalization). Very low A wave velocity  ?suggests recent atrial fibrillation and mechanical atrial stunning or  ?chronic left atrial failure.  ? 4. The left ventricle has no regional wall motion abnormalities.  ? 5. Global right ventricle has normal systolic function.The right  ?ventricular size is normal. No increase in right ventricular wall  ?thickness.  ? 6. Left atrial size was mildly  dilated.  ? 7. Right atrial size was mildly dilated.  ? 8. Mild mitral annular calcification.  ? 9. The mitral valve is normal in structure. Mild to moderate mitral valve  ?regurgitation. No evidence of mitral stenosis.

## 2021-04-18 ENCOUNTER — Encounter: Payer: Self-pay | Admitting: Cardiology

## 2021-04-18 ENCOUNTER — Encounter: Payer: Self-pay | Admitting: *Deleted

## 2021-04-18 ENCOUNTER — Ambulatory Visit: Payer: Medicare HMO | Admitting: Cardiology

## 2021-04-18 VITALS — BP 104/58 | HR 64 | Ht 67.0 in | Wt 186.8 lb

## 2021-04-18 DIAGNOSIS — D649 Anemia, unspecified: Secondary | ICD-10-CM | POA: Diagnosis not present

## 2021-04-18 DIAGNOSIS — Z79899 Other long term (current) drug therapy: Secondary | ICD-10-CM

## 2021-04-18 DIAGNOSIS — D469 Myelodysplastic syndrome, unspecified: Secondary | ICD-10-CM | POA: Diagnosis not present

## 2021-04-18 DIAGNOSIS — Z01818 Encounter for other preprocedural examination: Secondary | ICD-10-CM | POA: Diagnosis not present

## 2021-04-18 DIAGNOSIS — I4819 Other persistent atrial fibrillation: Secondary | ICD-10-CM | POA: Diagnosis not present

## 2021-04-18 DIAGNOSIS — I10 Essential (primary) hypertension: Secondary | ICD-10-CM

## 2021-04-18 LAB — BASIC METABOLIC PANEL
BUN/Creatinine Ratio: 12 (ref 12–28)
BUN: 15 mg/dL (ref 8–27)
CO2: 25 mmol/L (ref 20–29)
Calcium: 9.2 mg/dL (ref 8.7–10.3)
Chloride: 107 mmol/L — ABNORMAL HIGH (ref 96–106)
Creatinine, Ser: 1.29 mg/dL — ABNORMAL HIGH (ref 0.57–1.00)
Glucose: 100 mg/dL — ABNORMAL HIGH (ref 70–99)
Potassium: 4.5 mmol/L (ref 3.5–5.2)
Sodium: 146 mmol/L — ABNORMAL HIGH (ref 134–144)
eGFR: 43 mL/min/{1.73_m2} — ABNORMAL LOW (ref >59)

## 2021-04-18 MED ORDER — AMIODARONE HCL 200 MG PO TABS: ORAL_TABLET | ORAL | 3 refills | Status: DC

## 2021-04-18 NOTE — Progress Notes (Signed)
?Electrophysiology Office Note:   ? ?Date:  04/18/2021  ? ?ID:  [name] [name], DOB 06-18-1945, MRN [id number] ? ?PCP:  [name] Stains, MD  ?[place] Cardiologist:  [name] Furbish, MD  ?[facility] Electrophysiologist:  None  ? ?Referring MD: [name] Stains, MD  ? ?Chief Complaint: New patient consult for watchman procedure ? ?History of Present Illness:   ? ?[name] [name] is a 76 y.o. female who presents for a consult regarding the Watchman procedure at the request of Dr. Harlan [name]. Their medical history includes paroxysmal atrial fibrillation, CKD stage 3, hypertension, hyperlipidemia, sleep apnea not on CPAP, GERD, and arthritis. ? ?She followed up with [name] Montana, NP 04/17/2021. 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 ? ?She presented to the ED 04/07/2021 complaining of palpitations. Her heart rate was greater than 150 bpm, not relieved with home metoprolol. She was found to be in Afib with RVR, anticoagulated on DOAC. Cardioversion was performed and she reverted to sinus rhythm. She did have 2 cardioversions prior, one on 03/22/2021, another 4+ years ago. ? ?She is accompanied by her husband. Overall, she appears well. ? ?While on amiodarone previously, she had no issues. She reports her amiodarone was stopped 08/2020 due to her MDS. Prior to her diagnosis she was very active. Most of the time she is able to complete activities such as going to the grocery store, but sometimes her fatigue is too severe. Recently she did yard work and gardening, but was wiped out the next day. ? ?She denies any palpitations, chest pain, shortness of breath, or peripheral edema. No lightheadedness, headaches, syncope, orthopnea, or PND. ? ? ? ?  ?Past Medical History:  ?Diagnosis Date  ? A-fib ([place])   ? paroxysmal  ? Anxiety   ? Arthritis   ? Cataracts, bilateral   ? immature  ? CKD (chronic kidney disease) stage 3, GFR  30-59 ml/min (HCC)   ? Depression   ? GERD (gastroesophageal reflux disease)   ? was on Nexium   ? GERD (gastroesophageal reflux disease)   ? Hemorrhoid   ? History of bronchitis   ? couple of yrs ago  ? History of colon polyps   ? History of peristent atrial fibrillation   ? Hyperlipidemia   ? takes Pravastatin daily  ? Hypertension   ? takes Amlodipine and Diovan daily  ? Hypertension   ? Pre-diabetes   ? Sleep apnea   ? no cpap used  ? Sleep apnea   ? study done 20+yrs ago and doesn't use cpap  ? Urinary urgency   ? Weakness   ? in left leg r/t back  ? ? ?Past Surgical History:  ?Procedure Laterality Date  ? ABDOMINAL HYSTERECTOMY  1980  ? partial  ? ABDOMINAL HYSTERECTOMY    ? partial  ? BACK SURGERY    ? CARDIOVERSION N/A 02/24/2016  ? Procedure: CARDIOVERSION;  Surgeon: Fay Records, MD;  Location: [place];  Service: Cardiovascular;  Laterality: N/A;  ? CARDIOVERSION N/A 03/19/2016  ? Procedure: CARDIOVERSION;  Surgeon: Fay Records, MD;  Location: [place];  Service: Cardiovascular;  Laterality: N/A;  ? CARPAL TUNNEL RELEASE    ? CHOLECYSTECTOMY  1975  ? CHOLECYSTECTOMY    ? COLONOSCOPY    ? COLONOSCOPY WITH PROPOFOL  12/29/2011  ? Procedure: COLONOSCOPY WITH PROPOFOL;  Surgeon: Lear Ng, MD;  [facility];  Service: Endoscopy;  Laterality:  N/A;  ? COLONOSCOPY WITH PROPOFOL N/A 05/24/2014  ? Procedure: COLONOSCOPY WITH PROPOFOL;  Surgeon: Wilford Corner, MD;  [facility];  Service: Endoscopy;  Laterality: N/A;  ? ESOPHAGEAL MANOMETRY N/A 02/22/2017  ? Procedure: ESOPHAGEAL MANOMETRY (EM);  Surgeon: Wilford Corner, MD;  [facility];  Service: Endoscopy;  Laterality: N/A;  ? ESOPHAGOGASTRODUODENOSCOPY    ? FOOT SURGERY    ? left, right  ? FRACTURE SURGERY    ? left leg-knee  ? HEEL SPUR EXCISION Bilateral   ? HOT HEMOSTASIS  12/29/2011  ? Procedure: HOT HEMOSTASIS (ARGON PLASMA COAGULATION/BICAP);  Surgeon: Lear Ng, MD;  Location: [facility];   Service: Endoscopy;  Laterality: N/A;  ? HOT HEMOSTASIS N/A 05/24/2014  ? Procedure: HOT HEMOSTASIS (ARGON PLASMA COAGULATION/BICAP);  Surgeon: Wilford Corner, MD;  Location: [facility];  Service: Endoscopy;  Laterality: N/A;  ? leg surgery d/t break Left   ? LUMBAR LAMINECTOMY/DECOMPRESSION MICRODISCECTOMY Left 04/05/2013  ? Procedure: LUMBAR FOUR TO FIVE LUMBAR LAMINECTOMY/DECOMPRESSION MICRODISCECTOMY 1 LEVEL;  Surgeon: Eustace Moore, MD;  Location: [place] [facility];  Service: Neurosurgery;  Laterality: Left;  ? NISSEN FUNDOPLICATION    ? [place] IMPEDANCE STUDY N/A 02/22/2017  ? Procedure: [place] IMPEDANCE STUDY;  Surgeon: Wilford Corner, MD;  [facility];  Service: Endoscopy;  Laterality: N/A;  ? TEE WITHOUT CARDIOVERSION N/A 02/24/2016  ? Procedure: TRANSESOPHAGEAL ECHOCARDIOGRAM (TEE);  Surgeon: Fay Records, MD;  Location: [place];  Service: Cardiovascular;  Laterality: N/A;  ? TOTAL KNEE ARTHROPLASTY Left 10/07/2016  ? TOTAL KNEE ARTHROPLASTY Left 10/07/2016  ? Procedure: TOTAL KNEE ARTHROPLASTY;  Surgeon: [name], MD;  Location: [place];  Service: Orthopedics;  Laterality: Left;  ? ? ?Current Medications: ?Current Meds  ?Medication Sig  ? albuterol (VENTOLIN HFA) 108 (90 Base) MCG/ACT inhaler Inhale 1-2 puffs into the lungs every 4 (four) hours as needed for wheezing or shortness of breath.  ? ALPRAZolam (XANAX) 0.5 MG tablet Take 1 tablet (0.5 mg total) by mouth 3 (three) times daily as needed for anxiety.  ? amLODipine (NORVASC) 2.5 MG tablet Take 2.5 mg by mouth daily.  ? apixaban (ELIQUIS) 5 MG TABS tablet Take 1 tablet (5 mg total) by mouth 2 (two) times daily.  ? Ascorbic Acid (VITAMIN C WITH ROSE HIPS) 1000 MG tablet Take 1,000 mg by mouth daily.  ? benzonatate (TESSALON) 200 MG capsule Take 1 capsule by mouth three times daily as needed for cough  ? Biotin 5 MG TBDP Take by mouth.  ? bisacodyl (DULCOLAX) 5 MG EC tablet See admin instructions.  ? budesonide-formoterol (SYMBICORT) 160-4.5  MCG/ACT inhaler Inhale into the lungs.  ? buPROPion (WELLBUTRIN SR) 200 MG 12 hr tablet Take 400 mg by mouth daily.  ? calcium carbonate (OSCAL) 1500 (600 Ca) MG TABS tablet Take 1 tablet by mouth daily with breakfast.  ? CALCIUM PO Take 1 tablet by mouth every morning.  ? Cholecalciferol (VITAMIN D) 2000 UNITS tablet Take 4,000 Units by mouth daily.  ? [employer]S MELATONIN PO Take 1 tablet by mouth at bedtime as needed.  ? fexofenadine (ALLEGRA) 180 MG tablet Take 180 mg by mouth daily as needed for allergies or rhinitis.  ? furosemide (LASIX) 20 MG tablet TAKE 1 TABLET EVERY DAY  ? levothyroxine (SYNTHROID) 75 MCG tablet TAKE 1 TABLET DAILY BEFORE BREAKFAST (NEED APPOINTMENT FOR FURTHER REFILLS)  ? metoprolol succinate (TOPROL-XL) 25 MG 24 hr tablet TAKE 1/2 TABLET ONE TIME DAILY  ? potassium chloride SA (KLOR-CON M)  20 MEQ tablet TAKE 1 TABLET EVERY DAY  ? pravastatin (PRAVACHOL) 40 MG tablet Take 40 mg by mouth at bedtime.  ? Spacer/Aero-Holding Chambers (AEROCHAMBER MV) inhaler Use as instructed  ? Turmeric (QC TUMERIC COMPLEX PO) once daily Herbal Name: Tumeric  ?  ? ?Allergies:   Omnicef [cefdinir], Bacitracin, Bacitracin-polymyxin b, Duloxetine hcl, Lexapro [escitalopram], Mirtazapine, Penicillins, Peroxide [hydrogen peroxide], Zoloft [sertraline hcl], and Neosporin [neomycin-bacitracin zn-polymyx]  ? ?Social History  ? ?Socioeconomic History  ? Marital status: Married  ?  Spouse name: Dominica Severin  ? Number of children: 2  ? Years of education: 58  ? Highest education level: Some college, no degree  ?Occupational History  ? Occupation: retired  ?Tobacco Use  ? Smoking status: Never  ? Smokeless tobacco: Never  ?Vaping Use  ? Vaping Use: Never used  ?Substance and Sexual Activity  ? Alcohol use: No  ? Drug use: No  ? Sexual activity: Yes  ?  Birth control/protection: Surgical  ?Other Topics Concern  ? Not on file  ?Social History Narrative  ? Right handed  ? Drinks caffeine  ? Two story home  ? ?Social Determinants of  Health  ? ?Financial Resource Strain: Not on file  ?Food Insecurity: Not on file  ?Transportation Needs: Not on file  ?Physical Activity: Not on file  ?Stress: Not on file  ?Social Connections: Not on file  ?

## 2021-04-18 NOTE — Patient Instructions (Addendum)
Medication Instructions:  ?Start Amiodarone 400 mg daily for 14 days, then 200 mg daily going forward.  ?Your physician recommends that you continue on your current medications as directed. Please refer to the Current Medication list given to you today. ?*If you need a refill on your cardiac medications before your next appointment, please call your pharmacy* ? ?Lab Work: ?BMP today ?If you have labs (blood work) drawn today and your tests are completely normal, you will receive your results only by: ?MyChart Message (if you have MyChart) OR ?A paper copy in the mail ?If you have any lab test that is abnormal or we need to change your treatment, we will call you to review the results. ? ?Testing/Procedures: ?Your physician has requested that you have cardiac CT. Cardiac computed tomography (CT) is a painless test that uses an x-ray machine to take clear, detailed pictures of your heart. For further information please visit [email or web address]. Please follow instruction sheet as given. ? ? ?Follow-Up: ?At [facility], you and your health needs are our priority.  As part of our continuing mission to provide you with exceptional heart care, we have created designated Provider Care Teams.  These Care Teams include your primary Cardiologist (physician) and Advanced Practice Providers (APPs -  Physician Assistants and Nurse Practitioners) who all work together to provide you with the care you need, when you need it. ? ?Your physician wants you to follow-up in: Lenice Llamas, the [facility] Nurse Navigator, will call you after your CT once the [facility] Team has reviewed your imaging for an update on proceedings. Katy's direct number is [phone] if you need assistance.  ? ?We recommend signing up for the patient portal called "MyChart".  Sign up information is provided on this After Visit Summary.  MyChart is used to connect with patients for Virtual Visits (Telemedicine).  Patients are able to view lab/test results,  encounter notes, upcoming appointments, etc.  Non-urgent messages can be sent to your provider as well.   ?To learn more about what you can do with MyChart, go to [email or web address].   ? ?Any Other Special Instructions Will Be Listed Below (If Applicable). ? ?Left Atrial Appendage Closure Device Implantation ?Left atrial appendage (LAA) closure device implantation is a procedure to put a small device in the LAA of the heart. The LAA is a small sac in the wall of the heart's left upper chamber. Blood clots can form in the LAA in people with atrial fibrillation (AFib). The device closes the LAA to help prevent a blood clot and stroke. ?AFib is a type of irregular or rapid heartbeat (arrhythmia). There is an increased risk of blood clots and stroke with AFib. This procedure helps to reduce that risk. ?Tell a health care provider about: ?Any allergies you have. ?All medicines you are taking, including vitamins, herbs, eye drops, creams, and over-the-counter medicines. ?Any problems you or family members have had with anesthetic medicines. ?Any blood disorders you have. ?Any surgeries you have had. ?Any medical conditions you have. ?Whether you are pregnant or may be pregnant. ?What are the risks? ?Generally, this is a safe procedure. However, problems may occur, including: ?Infection. ?Bleeding. ?Allergic reactions to medicines or dyes. ?Damage to nearby structures or organs. ?Heart attack. ?Stroke. ?Blood clots. ?Changes in heart rhythm. ?Device failure. ?What happens before the procedure? ?Staying hydrated ?Follow instructions from your health care provider about hydration, which may include: ?Up to 2 hours before the procedure - you may continue to drink clear liquids, such as water,  clear fruit juice, black coffee, and plain tea. ?Eating and drinking restrictions ?Follow instructions from your health care provider about eating and drinking, which may include: ?8 hours before the procedure - stop eating heavy  meals or foods, such as meat, fried foods, or fatty foods. ?6 hours before the procedure - stop eating light meals or foods, such as toast or cereal. ?6 hours before the procedure - stop drinking milk or drinks that contain milk. ?2 hours before the procedure - stop drinking clear liquids. ?Medicines ?Ask your health care provider about: ?Changing or stopping your regular medicines. This is especially important if you are taking diabetes medicines or blood thinners. ?Taking medicines such as aspirin and ibuprofen. These medicines can thin your blood. Do not take these medicines unless your health care provider tells you to take them. ?Taking over-the-counter medicines, vitamins, herbs, and supplements. ?Tests ?You may have blood tests and a physical exam. ?You may have an electrocardiogram (ECG). This test checks your heart's electrical patterns and rhythms. ?General instructions ?Do not use any products that contain nicotine or tobacco. These include cigarettes, chewing tobacco, and vaping devices, such as e-cigarettes. If you need help quitting, ask your health care provider. ?Ask your health care provider what steps will be taken to help prevent infection. These steps may include: ?Removing hair at the surgery site. ?Washing skin with a germ-killing soap. ?Taking antibiotic medicine. ?Plan to have a responsible adult take you home from the hospital or clinic. ?Plan to have a responsible adult care for you for the time you are told after you leave the hospital or clinic. This is important. ?What happens during the procedure? ?An IV will be inserted into one of your veins. ?You will be given one or more of the following: ?A medicine to help you relax (sedative). ?A medicine to make you fall asleep (general anesthetic). ?A small incision will be made in your groin area. ?A small wire will be put through the incision and into a blood vessel. ?Dye may be injected so X-rays can be used to guide the wire through the  blood vessel. ?A long, thin tube (catheter) will be put over the small wire and moved up through the blood vessel to reach your heart. ?The closure device will be moved through the catheter until it reaches your heart. ?A small hole will be made in the septum (transseptal puncture). The septum is a thin tissue that separates the upper two chambers of the heart. ?The device will be placed so that it closes the LAA. X-rays will be done to make sure the device is in the right place. ?The catheter and wire will be removed. The closure device will remain in your heart. ?After pressure is applied over the catheter site to prevent bleeding, a bandage (dressing) will be placed over the site where the catheter was inserted. ?The procedure may vary among health care providers and hospitals. ?What happens after the procedure? ?Your blood pressure, heart rate, breathing rate, and blood oxygen level will be monitored until you leave the hospital or clinic. ?You may have to wear compression stockings. These stockings help to prevent blood clots and reduce swelling in your legs. ?If you were given a sedative during the procedure, it can affect you for several hours. Do not drive or operate machinery until your health care provider says it is safe. ?You may be given pain medicine. ?You may need to drink more fluids to wash (flush) the dye out of your body. Drink  enough fluid to keep your urine pale yellow. ?Take over-the-counter and prescription medicines only as told by your health care provider. This is especially important if you were given blood thinners. ?Summary ?Left atrial appendage (LAA) closure device implantation is a procedure that is done to put a small device in the LAA of the heart. The LAA is a small sac in the wall of the heart's left upper chamber. ?The device closes the LAA to prevent stroke and other problems. ?Follow instructions from your health care provider before and after the procedure. ?This information  is not intended to replace advice given to you by your health care provider. Make sure you discuss any questions you have with your health care provider. ?Document Revised: 09/07/2019 Document Reviewed: 09/07/18

## 2021-04-20 ENCOUNTER — Emergency Department (HOSPITAL_BASED_OUTPATIENT_CLINIC_OR_DEPARTMENT_OTHER)
Admission: EM | Admit: 2021-04-20 | Discharge: 2021-04-20 | Disposition: A | Discharge status: Home / Self Care | Payer: Medicare HMO | Attending: Emergency Medicine | Admitting: Emergency Medicine

## 2021-04-20 ENCOUNTER — Other Ambulatory Visit: Payer: Self-pay

## 2021-04-20 ENCOUNTER — Emergency Department (HOSPITAL_BASED_OUTPATIENT_CLINIC_OR_DEPARTMENT_OTHER): Payer: Medicare HMO | Admitting: Radiology

## 2021-04-20 ENCOUNTER — Encounter (HOSPITAL_BASED_OUTPATIENT_CLINIC_OR_DEPARTMENT_OTHER): Payer: Self-pay

## 2021-04-20 DIAGNOSIS — Z7901 Long term (current) use of anticoagulants: Secondary | ICD-10-CM | POA: Insufficient documentation

## 2021-04-20 DIAGNOSIS — I4891 Unspecified atrial fibrillation: Secondary | ICD-10-CM | POA: Insufficient documentation

## 2021-04-20 DIAGNOSIS — Z79899 Other long term (current) drug therapy: Secondary | ICD-10-CM | POA: Diagnosis not present

## 2021-04-20 LAB — CBC
HCT: 38.3 % (ref 36.0–46.0)
Hemoglobin: 12 g/dL (ref 12.0–15.0)
MCH: 30.1 pg (ref 26.0–34.0)
MCHC: 31.3 g/dL (ref 30.0–36.0)
MCV: 96 fL (ref 80.0–100.0)
Platelets: 87 10*3/uL — ABNORMAL LOW (ref 150–400)
RBC: 3.99 MIL/uL (ref 3.87–5.11)
RDW: 16.1 % — ABNORMAL HIGH (ref 11.5–15.5)
WBC: 4.4 10*3/uL (ref 4.0–10.5)
nRBC: 0 % (ref 0.0–0.2)

## 2021-04-20 LAB — BASIC METABOLIC PANEL
Anion gap: 7 (ref 5–15)
BUN: 21 mg/dL (ref 8–23)
CO2: 28 mmol/L (ref 22–32)
Calcium: 9.7 mg/dL (ref 8.9–10.3)
Chloride: 103 mmol/L (ref 98–111)
Creatinine, Ser: 1.65 mg/dL — ABNORMAL HIGH (ref 0.44–1.00)
GFR, Estimated: 32 mL/min — ABNORMAL LOW (ref >60)
Glucose, Bld: 118 mg/dL — ABNORMAL HIGH (ref 70–99)
Potassium: 4.5 mmol/L (ref 3.5–5.1)
Sodium: 138 mmol/L (ref 135–145)

## 2021-04-20 MED ORDER — SODIUM CHLORIDE 0.9 % IV BOLUS
500.0000 mL | Freq: Once | INTRAVENOUS | Status: AC
  Administered 2021-04-20: 500 mL via INTRAVENOUS

## 2021-04-20 MED ORDER — METOPROLOL TARTRATE 5 MG/5ML IV SOLN
5.0000 mg | Freq: Once | INTRAVENOUS | Status: AC
  Administered 2021-04-20: 5 mg via INTRAVENOUS
  Filled 2021-04-20: qty 5

## 2021-04-20 MED ORDER — ETOMIDATE 2 MG/ML IV SOLN
10.0000 mg | Freq: Once | INTRAVENOUS | Status: DC
  Filled 2021-04-20: qty 10

## 2021-04-20 MED ORDER — ETOMIDATE 2 MG/ML IV SOLN
INTRAVENOUS | Status: AC | PRN
  Administered 2021-04-20: 8 mg via INTRAVENOUS

## 2021-04-20 NOTE — Sedation Documentation (Signed)
Note: Procedural timeout was performed at 1830. ?

## 2021-04-20 NOTE — ED Notes (Signed)
Denies any dizziness. B/p 81/41 while standing. 571m bolus completed. Dr. AZenia Residesaware. Meal given to pt. Will monitor.  ?

## 2021-04-20 NOTE — ED Notes (Signed)
RT Note: Successful Conscious Sedation performed for Cardioversion, no Complications. ?

## 2021-04-20 NOTE — ED Notes (Signed)
Dr. Zenia Resides aware of pt's b/p while standing. Pt denies any dizziness. Orders received ?

## 2021-04-20 NOTE — ED Notes (Signed)
Pt has eaten. B/p improved. MD aware. Orders received for d/c home.  ?

## 2021-04-20 NOTE — ED Provider Notes (Addendum)
?[facility] ?Provider Note ? ? ?CSN: [id number] ?Arrival date & time: 04/20/21  1625 ? ?  ? ?History ? ?Chief Complaint  ?Patient presents with  ? Atrial Fibrillation  ? ? ?[name] [name] is a 76 y.o. female. ? ?76 year old female who presents with irregular heartbeat which began about an hour and a half ago.  Patient has a history of paroxysmal A-fib and had a recent cardioversion couple weeks ago.  That would be the third time she has been cardioverted.  She does take Eliquis.  Denies any chest pain or chest pressure.  Took her home metoprolol prior to arrival without relief.  Denies any syncope ? ? ?  ? ?Home Medications ?Prior to Admission medications   ?Medication Sig Start Date End Date Taking? Authorizing Provider  ?albuterol (VENTOLIN HFA) 108 (90 Base) MCG/ACT inhaler Inhale 1-2 puffs into the lungs every 4 (four) hours as needed for wheezing or shortness of breath. 07/22/18   Parrett, Fonnie Mu, NP  ?ALPRAZolam (XANAX) 0.5 MG tablet Take 1 tablet (0.5 mg total) by mouth 3 (three) times daily as needed for anxiety. 03/11/21   Owens Shark, NP  ?amiodarone (PACERONE) 200 MG tablet Take 2 tablets (400 mg total) by mouth daily for 14 days, THEN 1 tablet (200 mg total) daily. 04/18/21 04/27/22  Vickie Epley, MD  ?amLODipine (NORVASC) 2.5 MG tablet Take 2.5 mg by mouth daily. 02/21/21   [provider]  ?apixaban (ELIQUIS) 5 MG TABS tablet Take 1 tablet (5 mg total) by mouth 2 (two) times daily. 03/27/21 03/22/22  Loel Dubonnet, NP  ?Ascorbic Acid (VITAMIN C WITH ROSE HIPS) 1000 MG tablet Take 1,000 mg by mouth daily.    [provider]  ?benzonatate (TESSALON) 200 MG capsule Take 1 capsule by mouth three times daily as needed for cough 05/22/19   Parrett, Fonnie Mu, NP  ?Biotin 5 MG TBDP Take by mouth.    [provider]  ?bisacodyl (DULCOLAX) 5 MG EC tablet See admin instructions. 09/11/20   [provider]  ?budesonide-formoterol (SYMBICORT) 160-4.5  MCG/ACT inhaler Inhale into the lungs.    [provider]  ?buPROPion (WELLBUTRIN SR) 200 MG 12 hr tablet Take 400 mg by mouth daily.    [provider]  ?calcium carbonate (OSCAL) 1500 (600 Ca) MG TABS tablet Take 1 tablet by mouth daily with breakfast.    [provider]  ?CALCIUM PO Take 1 tablet by mouth every morning.    [provider]  ?Cholecalciferol (VITAMIN D) 2000 UNITS tablet Take 4,000 Units by mouth daily.    [provider]  ?[employer] MELATONIN PO Take 1 tablet by mouth at bedtime as needed.    [provider]  ?fexofenadine (ALLEGRA) 180 MG tablet Take 180 mg by mouth daily as needed for allergies or rhinitis.    [provider]  ?furosemide (LASIX) 20 MG tablet TAKE 1 TABLET EVERY DAY 01/29/21   Jerline Pain, MD  ?levothyroxine (SYNTHROID) 75 MCG tablet TAKE 1 TABLET DAILY BEFORE BREAKFAST (NEED APPOINTMENT FOR FURTHER REFILLS) 01/15/21   Renato Shin, MD  ?metoprolol succinate (TOPROL-XL) 25 MG 24 hr tablet TAKE 1/2 TABLET ONE TIME DAILY 01/29/21   Jerline Pain, MD  ?potassium chloride SA (KLOR-CON M) 20 MEQ tablet TAKE 1 TABLET EVERY DAY 01/29/21   Jerline Pain, MD  ?pravastatin (PRAVACHOL) 40 MG tablet Take 40 mg by mouth at bedtime.    [provider]  ?Spacer/Aero-Holding Josiah Lobo (  AEROCHAMBER MV) inhaler Use as instructed 10/13/18   Deneise Lever, MD  ?Turmeric (QC TUMERIC COMPLEX PO) once daily Herbal Name: Tumeric    [provider]  ?   ? ?Allergies    ?Omnicef [cefdinir], Bacitracin, Bacitracin-polymyxin b, Duloxetine hcl, Lexapro [escitalopram], Mirtazapine, Penicillins, Peroxide [hydrogen peroxide], Zoloft [sertraline hcl], and Neosporin [neomycin-bacitracin zn-polymyx]   ? ?Review of Systems   ?Review of Systems  ?All other systems reviewed and are negative. ? ?Physical Exam ?Updated Vital Signs ?BP 116/66 (BP Location: Left Arm)   Pulse (!) 140   Temp 98.2 ?F (36.8 ?C) (Oral)   Resp 15   Ht 1.702 m (5'  7")   Wt 84.7 kg   SpO2 98%   BMI 29.25 kg/m?  ?Physical Exam ?Vitals and nursing note reviewed.  ?Constitutional:   ?   General: She is not in acute distress. ?   Appearance: Normal appearance. She is well-developed. She is not toxic-appearing.  ?HENT:  ?   Head: Normocephalic and atraumatic.  ?Eyes:  ?   General: Lids are normal.  ?   Conjunctiva/sclera: Conjunctivae normal.  ?   Pupils: Pupils are equal, round, and reactive to light.  ?Neck:  ?   Thyroid: No thyroid mass.  ?   Trachea: No tracheal deviation.  ?Cardiovascular:  ?   Rate and Rhythm: Tachycardia present. Rhythm irregular.  ?   Heart sounds: Normal heart sounds. No murmur heard. ?  No gallop.  ?Pulmonary:  ?   Effort: Pulmonary effort is normal. No respiratory distress.  ?   Breath sounds: Normal breath sounds. No stridor. No decreased breath sounds, wheezing, rhonchi or rales.  ?Abdominal:  ?   General: There is no distension.  ?   Palpations: Abdomen is soft.  ?   Tenderness: There is no abdominal tenderness. There is no rebound.  ?Musculoskeletal:     ?   General: No tenderness. Normal range of motion.  ?   Cervical back: Normal range of motion and neck supple.  ?Skin: ?   General: Skin is warm and dry.  ?   Findings: No abrasion or rash.  ?Neurological:  ?   Mental Status: She is alert and oriented to person, place, and time. Mental status is at baseline.  ?   GCS: GCS eye subscore is 4. GCS verbal subscore is 5. GCS motor subscore is 6.  ?   Cranial Nerves: No cranial nerve deficit.  ?   Sensory: No sensory deficit.  ?   Motor: Motor function is intact.  ?Psychiatric:     ?   Attention and Perception: Attention normal.     ?   Speech: Speech normal.     ?   Behavior: Behavior normal.  ? ? ?ED Results / Procedures / Treatments   ?Labs ?(all labs ordered are listed, but only abnormal results are displayed) ?Labs Reviewed  ?BASIC METABOLIC PANEL  ?CBC  ? ? ?EKG ?None ?ED ECG REPORT ? ? Date: 04/20/2021 ? Rate: 150 ? Rhythm: atrial fibrillation ?  QRS Axis: normal ? Intervals: normal ? ST/T Wave abnormalities: nonspecific ST changes ? Conduction Disutrbances:none ? Narrative Interpretation:  ? Old EKG Reviewed: changes noted ? ?I have personally reviewed the EKG tracing and agree with the computerized printout as noted. ? ?Radiology ?No results found. ? ?Procedures ?.Cardioversion ? ?Date/Time: 04/20/2021 6:46 PM ?Performed by: Lacretia Leigh, MD ?Authorized by: Lacretia Leigh, MD  ? ?Consent:  ?  Consent obtained:  Written ?  Consent given by:  Patient ?  Risks discussed:  Death ?  Alternatives discussed:  No treatment ?Universal protocol:  ?  Immediately prior to procedure a time out was called: yes   ?  Patient identity confirmed:  Verbally with patient ?Pre-procedure details:  ?  Cardioversion basis:  Emergent ?  Rhythm:  Atrial fibrillation ?  Electrode placement:  Anterior-posterior ?Patient sedated: Yes. Refer to sedation procedure documentation for details of sedation. ? ?Attempt one:  ?  Cardioversion mode:  Synchronous ?  Waveform:  Biphasic ?  Shock (Joules):  120 ?  Shock outcome:  Conversion to normal sinus rhythm ?Post-procedure details:  ?  Patient status:  Awake ?  Patient tolerance of procedure:  Tolerated well, no immediate complications ?.Sedation ? ?Date/Time: 04/20/2021 6:47 PM ?Performed by: Lacretia Leigh, MD ?Authorized by: Lacretia Leigh, MD  ? ?Consent:  ?  Consent obtained:  Written ?  Consent given by:  Patient ?  Risks discussed:  Allergic reaction ?Universal protocol:  ?  Immediately prior to procedure, a time out was called: yes   ?  Patient identity confirmed:  Arm band ?Indications:  ?  Procedure performed:  Cardioversion ?  Procedure necessitating sedation performed by:  Physician performing sedation ?Pre-sedation assessment:  ?  Time since last food or drink:  200pm ?  ASA classification: class 2 - patient with mild systemic disease   ?  Mouth opening:  3 or more finger widths ?  Mallampati score:  I - soft palate, uvula,  fauces, pillars visible ?  Neck mobility: normal   ?  Pre-sedation assessments completed and reviewed: airway patency, cardiovascular function, mental status and nausea/vomiting   ?  Pre-sedation assessment comp

## 2021-04-20 NOTE — ED Triage Notes (Signed)
Patient here POV from Home with A. Fib. ? ?Patient endorses she knows when she is in A. Fib. and began to have sensation approximately 1 hour ago. 2.5 mg of Metoprolol PTA with no Change. Patient had Recent Cardioversion in ED recently and was discharged.  ? ?Patient was placed on Amiodarone since Discharge. No other Changes in Medicine.  ? ?No CP. Mild SOB. No N/V/D.  ? ?NAD Noted during Triage. A&Ox4. GCS 15. Ambulatory. ?

## 2021-04-20 NOTE — ED Notes (Signed)
Note one measurement of the Aldrete score was not recorded. Her Aldrete score at this time was a 10. ?

## 2021-04-21 ENCOUNTER — Telehealth (HOSPITAL_COMMUNITY): Payer: Self-pay

## 2021-04-21 ENCOUNTER — Telehealth: Payer: Self-pay | Admitting: Cardiology

## 2021-04-21 NOTE — Telephone Encounter (Signed)
Patient is following up. She states she increased Amiodarone as advised, but she is not feeling any better. HR is 75 currently. She is requesting additional recommendations if possible. ?

## 2021-04-21 NOTE — Telephone Encounter (Signed)
Returned call to patient who states since taking second dose earlier today (to equal '400mg'$ ) her HR is ranging from 77-123. Denies any symptoms. Asked her to check her BP and she states it's 97/59. Explained to patient that her BP will not be accurate d/t A-fib. Pt states that she's just worried and when her HR gets "crazy like this, I don't know what I'm supposed to be doing, I just don't want to have a stroke." Educated patient to monitor for CP, SOB, dizziness, weakness or lethargy. Explained that pt's HR will fluctuate and getting into the 120's is common, but that we aren't concerned unless it gets high and stays there. Pt appreciative, states that she feels reassured. Advised her to call back if she needs anything, that we are here to help her. ?

## 2021-04-21 NOTE — Telephone Encounter (Signed)
Spoke to patient while in triage who states that she was ED yesterday and was cardioverted successfully, but upon wakening this morning, her HR was 114 and ranged to 130bpm. Pt states this was approximately 10:45 and took her Amiodarone '200mg'$  about 11:15. Pt says her HR came down to 66 and stayed there approximately 1 hour before going back up to 100-120. At current time of call, HR is 76. Pt denies symptoms associated with the a-fib, just states that her rate is all over the place and doesn't know what to do about it. Clarified with patient that she should be taking 2 tablets ('400mg'$ ) total for 14 days, then decreasing to 1 tablet thereafter. Pt states she has been splitting this into an AM and PM dose and didn't realize she was supposed to be taking the total dose at once. Educated pt on the purpose of the loading dose and that the medication was trying to convert her back into SR, which is why she only saw short term results with lingering lower HR. Pt states she understands and will take additional dose now to complete daily dose and effective tomorrow, will take 2 tablets at once. Will call back if she has any other issues. ?

## 2021-04-21 NOTE — Telephone Encounter (Signed)
STAT if HR is under 50 or over 120 ?(normal HR is 60-100 beats per minute) ? ?What is your heart rate? 86, 96, 114, 133, 134 ? ?Do you have a log of your heart rate readings (document readings)? Yes  ? ?Do you have any other symptoms? Afib, headache, don't feel good at all  ?

## 2021-04-21 NOTE — Telephone Encounter (Signed)
Left message for patient to call back to schedule ED follow up appointment. ?

## 2021-04-24 ENCOUNTER — Encounter: Payer: Self-pay | Admitting: Cardiology

## 2021-04-28 ENCOUNTER — Ambulatory Visit (HOSPITAL_COMMUNITY)
Admission: RE | Admit: 2021-04-28 | Discharge: 2021-04-28 | Disposition: A | Discharge status: Home / Self Care | Payer: Medicare HMO | Source: Ambulatory Visit | Attending: Physician Assistant | Admitting: Physician Assistant

## 2021-04-28 VITALS — BP 112/66 | HR 133 | Ht 67.0 in | Wt 182.4 lb

## 2021-04-28 DIAGNOSIS — Z8249 Family history of ischemic heart disease and other diseases of the circulatory system: Secondary | ICD-10-CM | POA: Insufficient documentation

## 2021-04-28 DIAGNOSIS — Z7901 Long term (current) use of anticoagulants: Secondary | ICD-10-CM | POA: Diagnosis not present

## 2021-04-28 DIAGNOSIS — G4733 Obstructive sleep apnea (adult) (pediatric): Secondary | ICD-10-CM | POA: Diagnosis not present

## 2021-04-28 DIAGNOSIS — I4819 Other persistent atrial fibrillation: Secondary | ICD-10-CM

## 2021-04-28 DIAGNOSIS — Z79899 Other long term (current) drug therapy: Secondary | ICD-10-CM | POA: Insufficient documentation

## 2021-04-28 DIAGNOSIS — E785 Hyperlipidemia, unspecified: Secondary | ICD-10-CM | POA: Insufficient documentation

## 2021-04-28 DIAGNOSIS — D6869 Other thrombophilia: Secondary | ICD-10-CM

## 2021-04-28 DIAGNOSIS — I129 Hypertensive chronic kidney disease with stage 1 through stage 4 chronic kidney disease, or unspecified chronic kidney disease: Secondary | ICD-10-CM | POA: Insufficient documentation

## 2021-04-28 DIAGNOSIS — N1831 Chronic kidney disease, stage 3a: Secondary | ICD-10-CM | POA: Diagnosis not present

## 2021-04-28 HISTORY — DX: Other thrombophilia: D68.69

## 2021-04-28 MED ORDER — METOPROLOL SUCCINATE ER 25 MG PO TB24: 25.0000 mg | ORAL_TABLET | Freq: Every day | ORAL | 3 refills | Status: DC

## 2021-04-28 NOTE — Patient Instructions (Addendum)
Hold amlodipine (norvasc) for now ? ?Increase metoprolol to '25mg'$  once a day ? ?Increase Amiodarone to '400mg'$  twice a day for the next 3 days then back to previously instructed taper  - '400mg'$  daily for another week then '200mg'$  daily ?

## 2021-04-28 NOTE — Progress Notes (Addendum)
? ? ?Primary Care Physician: Harlan Stains, MD ?Primary Cardiologist: Dr Marlou Porch ?Primary Electrophysiologist: Dr Quentin Ore ?Referring Physician: Dr Quentin Ore ? ? ?[name] [name] is a 76 y.o. female with a history of CKD, HTN, HLD, MDS, OSA, atrial fibrillation who presents for follow up in the [facility]. The patient was initially diagnosed with atrial fibrillation remotely and had been maintained on amiodarone. This was discontinued for possible side effects. It was later felt that these symptoms were due to possible MDS and not amiodarone. The medication was resumed 04/18/21. She has had several ED visits for rapid afib requiring DCCV, most recently 04/20/21. She was back in afib the very next day. She has symptoms of fatigue and lightheadedness while in afib. Patient is on Eliquis for a CHADS2VASC score of 4. ? ?Today, she denies symptoms of palpitations, chest pain, shortness of breath, orthopnea, PND, lower extremity edema, presyncope, syncope, bleeding, or neurologic sequela. The patient is tolerating medications without difficulties and is otherwise without complaint today.  ? ? ?Atrial Fibrillation Risk Factors: ? ?she does have symptoms or diagnosis of sleep apnea. ?she is not compliant with CPAP therapy. ?she does not have a history of rheumatic fever. ? ? ?she has a BMI of Body mass index is 28.57 kg/m?Marland KitchenMarland Kitchen ?Filed Weights  ? 04/28/21 1521  ?Weight: 82.7 kg  ? ? ?Family History  ?Problem Relation Age of Onset  ? Colon cancer Mother   ? Heart disease Father   ? Breast cancer Neg Hx   ? Thyroid disease Neg Hx   ? ? ? ?Atrial Fibrillation Management history: ? ?Previous antiarrhythmic drugs: amiodarone  ?Previous cardioversions: 2018 x 2, 03/22/21, 04/07/21, 04/20/21 ?Previous ablations: none ?CHADS2VASC score: 4 ?Anticoagulation history: Eliquis ? ? ?Past Medical History:  ?Diagnosis Date  ? A-fib ([place])   ? paroxysmal  ? Anxiety   ? Arthritis   ? Cataracts, bilateral   ? immature  ? CKD  (chronic kidney disease) stage 3, GFR 30-59 ml/min (HCC)   ? Depression   ? GERD (gastroesophageal reflux disease)   ? was on Nexium   ? GERD (gastroesophageal reflux disease)   ? Hemorrhoid   ? History of bronchitis   ? couple of yrs ago  ? History of colon polyps   ? History of peristent atrial fibrillation   ? Hyperlipidemia   ? takes Pravastatin daily  ? Hypertension   ? takes Amlodipine and Diovan daily  ? Hypertension   ? Pre-diabetes   ? Sleep apnea   ? no cpap used  ? Sleep apnea   ? study done 20+yrs ago and doesn't use cpap  ? Urinary urgency   ? Weakness   ? in left leg r/t back  ? ?Past Surgical History:  ?Procedure Laterality Date  ? ABDOMINAL HYSTERECTOMY  1980  ? partial  ? ABDOMINAL HYSTERECTOMY    ? partial  ? BACK SURGERY    ? CARDIOVERSION N/A 02/24/2016  ? Procedure: CARDIOVERSION;  Surgeon: Fay Records, MD;  Location: [place];  Service: Cardiovascular;  Laterality: N/A;  ? CARDIOVERSION N/A 03/19/2016  ? Procedure: CARDIOVERSION;  Surgeon: Fay Records, MD;  Location: [place];  Service: Cardiovascular;  Laterality: N/A;  ? CARPAL TUNNEL RELEASE    ? CHOLECYSTECTOMY  1975  ? CHOLECYSTECTOMY    ? COLONOSCOPY    ? COLONOSCOPY WITH PROPOFOL  12/29/2011  ? Procedure: COLONOSCOPY WITH PROPOFOL;  Surgeon: Lear Ng, MD;  [facility];  Service: Endoscopy;  Laterality: N/A;  ? COLONOSCOPY WITH PROPOFOL N/A 05/24/2014  ? Procedure: COLONOSCOPY WITH PROPOFOL;  Surgeon: Wilford Corner, MD;  [facility];  Service: Endoscopy;  Laterality: N/A;  ? ESOPHAGEAL MANOMETRY N/A 02/22/2017  ? Procedure: ESOPHAGEAL MANOMETRY (EM);  Surgeon: Wilford Corner, MD;  [facility];  Service: Endoscopy;  Laterality: N/A;  ? ESOPHAGOGASTRODUODENOSCOPY    ? FOOT SURGERY    ? left, right  ? FRACTURE SURGERY    ? left leg-knee  ? HEEL SPUR EXCISION Bilateral   ? HOT HEMOSTASIS  12/29/2011  ? Procedure: HOT HEMOSTASIS (ARGON PLASMA COAGULATION/BICAP);  Surgeon: Lear Ng, MD;  Location: [facility];  Service: Endoscopy;  Laterality: N/A;  ? HOT HEMOSTASIS N/A 05/24/2014  ? Procedure: HOT HEMOSTASIS (ARGON PLASMA COAGULATION/BICAP);  Surgeon: Wilford Corner, MD;  Location: [facility];  Service: Endoscopy;  Laterality: N/A;  ? leg surgery d/t break Left   ? LUMBAR LAMINECTOMY/DECOMPRESSION MICRODISCECTOMY Left 04/05/2013  ? Procedure: LUMBAR FOUR TO FIVE LUMBAR LAMINECTOMY/DECOMPRESSION MICRODISCECTOMY 1 LEVEL;  Surgeon: Eustace Moore, MD;  Location: [place] [facility];  Service: Neurosurgery;  Laterality: Left;  ? NISSEN FUNDOPLICATION    ? [place] IMPEDANCE STUDY N/A 02/22/2017  ? Procedure: [place] IMPEDANCE STUDY;  Surgeon: Wilford Corner, MD;  [facility];  Service: Endoscopy;  Laterality: N/A;  ? TEE WITHOUT CARDIOVERSION N/A 02/24/2016  ? Procedure: TRANSESOPHAGEAL ECHOCARDIOGRAM (TEE);  Surgeon: Fay Records, MD;  Location: [place];  Service: Cardiovascular;  Laterality: N/A;  ? TOTAL KNEE ARTHROPLASTY Left 10/07/2016  ? TOTAL KNEE ARTHROPLASTY Left 10/07/2016  ? Procedure: TOTAL KNEE ARTHROPLASTY;  Surgeon: [name], MD;  Location: [place];  Service: Orthopedics;  Laterality: Left;  ? ? ?Current Outpatient Medications  ?Medication Sig Dispense Refill  ? albuterol (VENTOLIN HFA) 108 (90 Base) MCG/ACT inhaler Inhale 1-2 puffs into the lungs every 4 (four) hours as needed for wheezing or shortness of breath. 1 g 1  ? ALPRAZolam (XANAX) 0.5 MG tablet Take 1 tablet (0.5 mg total) by mouth 3 (three) times daily as needed for anxiety. 40 tablet 0  ? amiodarone (PACERONE) 200 MG tablet Take 2 tablets (400 mg total) by mouth daily for 14 days, THEN 1 tablet (200 mg total) daily. 90 tablet 3  ? amLODipine (NORVASC) 2.5 MG tablet Take 2.5 mg by mouth daily.    ? apixaban (ELIQUIS) 5 MG TABS tablet Take 1 tablet (5 mg total) by mouth 2 (two) times daily. 180 tablet 3  ? Ascorbic Acid (VITAMIN C WITH ROSE HIPS) 1000 MG tablet Take 1,000 mg by mouth daily.    ?  benzonatate (TESSALON) 200 MG capsule Take 1 capsule by mouth three times daily as needed for cough 60 capsule 0  ? Biotin 5 MG TBDP Take 1 tablet by mouth every morning.    ? buPROPion (WELLBUTRIN SR) 200 MG 12 hr tablet Take 300 mg by mouth daily.    ? CALCIUM PO Take 1 tablet by mouth every morning.    ? Cholecalciferol (VITAMIN D) 2000 UNITS tablet Take 4,000 Units by mouth daily.    ? Coenzyme Q10-Vitamin E (QUNOL ULTRA COQ10 PO) Take 1 capsule by mouth every morning.    ? CVS MELATONIN PO Take 1 tablet by mouth at bedtime as needed.    ? Dextromethorphan-guaiFENesin (MUCINEX DM PO) Take 1 capsule by mouth every 12 (twelve) hours.    ? fexofenadine (ALLEGRA) 180 MG tablet Take 180 mg by mouth daily as needed for allergies  or rhinitis.    ? furosemide (LASIX) 20 MG tablet TAKE 1 TABLET EVERY DAY 90 tablet 3  ? levothyroxine (SYNTHROID) 75 MCG tablet TAKE 1 TABLET DAILY BEFORE BREAKFAST (NEED APPOINTMENT FOR FURTHER REFILLS) 90 tablet 0  ? metoprolol succinate (TOPROL-XL) 25 MG 24 hr tablet TAKE 1/2 TABLET ONE TIME DAILY 45 tablet 3  ? potassium chloride SA (KLOR-CON M) 20 MEQ tablet TAKE 1 TABLET EVERY DAY 90 tablet 2  ? pravastatin (PRAVACHOL) 40 MG tablet Take 40 mg by mouth at bedtime.    ? Spacer/Aero-Holding Chambers (AEROCHAMBER MV) inhaler Use as instructed 1 each 0  ? ?No current facility-administered medications for this encounter.  ? ? ?Allergies  ?Allergen Reactions  ? Omnicef [Cefdinir] Swelling  ?  Tongue swelling  ? Bacitracin   ?  Other reaction(s): Rash  ? Bacitracin-Polymyxin B   ?  Other reaction(s): Rash  ? Duloxetine Hcl   ?  Other reaction(s): felt funny- thinking messed up  ? Lexapro [Escitalopram]   ?  Makes her feel funny  ? Mirtazapine   ?  Other reaction(s): weight gain  ? Penicillins   ?  Pt cannot remember reaction she had as hadn't been on it in about 20 years but stated that she knows she is allergic to it.  ? Peroxide [Hydrogen Peroxide] Other (See Comments)  ?  Redness.   ? Zoloft  [Sertraline Hcl]   ?  Makes her feel funny  ? Neosporin [Neomycin-Bacitracin Zn-Polymyx] Rash  ?  Blisters, itching.   ? ? ?Social History  ? ?Socioeconomic History  ? Marital status: Married  ?  Spouse name: Dominica Severin  ? Numb

## 2021-04-29 ENCOUNTER — Inpatient Hospital Stay: Payer: Medicare HMO | Admitting: Oncology

## 2021-04-29 ENCOUNTER — Inpatient Hospital Stay: Payer: Medicare HMO | Attending: Oncology

## 2021-04-29 VITALS — BP 130/70 | HR 80 | Temp 98.2°F | Resp 18 | Ht 67.0 in | Wt 183.6 lb

## 2021-04-29 DIAGNOSIS — R161 Splenomegaly, not elsewhere classified: Secondary | ICD-10-CM | POA: Diagnosis not present

## 2021-04-29 DIAGNOSIS — I4891 Unspecified atrial fibrillation: Secondary | ICD-10-CM | POA: Diagnosis not present

## 2021-04-29 DIAGNOSIS — G4730 Sleep apnea, unspecified: Secondary | ICD-10-CM | POA: Diagnosis not present

## 2021-04-29 DIAGNOSIS — N189 Chronic kidney disease, unspecified: Secondary | ICD-10-CM | POA: Diagnosis not present

## 2021-04-29 DIAGNOSIS — R0609 Other forms of dyspnea: Secondary | ICD-10-CM | POA: Diagnosis not present

## 2021-04-29 DIAGNOSIS — R634 Abnormal weight loss: Secondary | ICD-10-CM | POA: Insufficient documentation

## 2021-04-29 DIAGNOSIS — D539 Nutritional anemia, unspecified: Secondary | ICD-10-CM | POA: Diagnosis not present

## 2021-04-29 DIAGNOSIS — Z79899 Other long term (current) drug therapy: Secondary | ICD-10-CM | POA: Insufficient documentation

## 2021-04-29 DIAGNOSIS — D696 Thrombocytopenia, unspecified: Secondary | ICD-10-CM | POA: Insufficient documentation

## 2021-04-29 DIAGNOSIS — I2510 Atherosclerotic heart disease of native coronary artery without angina pectoris: Secondary | ICD-10-CM | POA: Diagnosis not present

## 2021-04-29 DIAGNOSIS — R2681 Unsteadiness on feet: Secondary | ICD-10-CM | POA: Diagnosis not present

## 2021-04-29 DIAGNOSIS — R918 Other nonspecific abnormal finding of lung field: Secondary | ICD-10-CM | POA: Insufficient documentation

## 2021-04-29 DIAGNOSIS — I129 Hypertensive chronic kidney disease with stage 1 through stage 4 chronic kidney disease, or unspecified chronic kidney disease: Secondary | ICD-10-CM | POA: Insufficient documentation

## 2021-04-29 DIAGNOSIS — R630 Anorexia: Secondary | ICD-10-CM | POA: Diagnosis not present

## 2021-04-29 DIAGNOSIS — D649 Anemia, unspecified: Secondary | ICD-10-CM

## 2021-04-29 DIAGNOSIS — R5381 Other malaise: Secondary | ICD-10-CM | POA: Diagnosis not present

## 2021-04-29 LAB — CBC WITH DIFFERENTIAL (CANCER CENTER ONLY)
Abs Immature Granulocytes: 0.01 10*3/uL (ref 0.00–0.07)
Basophils Absolute: 0 10*3/uL (ref 0.0–0.1)
Basophils Relative: 1 %
Eosinophils Absolute: 0.1 10*3/uL (ref 0.0–0.5)
Eosinophils Relative: 5 %
HCT: 35.6 % — ABNORMAL LOW (ref 36.0–46.0)
Hemoglobin: 11.2 g/dL — ABNORMAL LOW (ref 12.0–15.0)
Immature Granulocytes: 0 %
Lymphocytes Relative: 35 %
Lymphs Abs: 1 10*3/uL (ref 0.7–4.0)
MCH: 30.2 pg (ref 26.0–34.0)
MCHC: 31.5 g/dL (ref 30.0–36.0)
MCV: 96 fL (ref 80.0–100.0)
Monocytes Absolute: 0.6 10*3/uL (ref 0.1–1.0)
Monocytes Relative: 21 %
Neutro Abs: 1 10*3/uL — ABNORMAL LOW (ref 1.7–7.7)
Neutrophils Relative %: 38 %
Platelet Count: 112 10*3/uL — ABNORMAL LOW (ref 150–400)
RBC: 3.71 MIL/uL — ABNORMAL LOW (ref 3.87–5.11)
RDW: 15.9 % — ABNORMAL HIGH (ref 11.5–15.5)
WBC Count: 2.7 10*3/uL — ABNORMAL LOW (ref 4.0–10.5)
nRBC: 0 % (ref 0.0–0.2)

## 2021-04-29 LAB — CMP (CANCER CENTER ONLY)
ALT: 6 U/L (ref 0–44)
AST: 13 U/L — ABNORMAL LOW (ref 15–41)
Albumin: 3.4 g/dL — ABNORMAL LOW (ref 3.5–5.0)
Alkaline Phosphatase: 57 U/L (ref 38–126)
Anion gap: 8 (ref 5–15)
BUN: 21 mg/dL (ref 8–23)
CO2: 26 mmol/L (ref 22–32)
Calcium: 9.9 mg/dL (ref 8.9–10.3)
Chloride: 106 mmol/L (ref 98–111)
Creatinine: 1.61 mg/dL — ABNORMAL HIGH (ref 0.44–1.00)
GFR, Estimated: 33 mL/min — ABNORMAL LOW (ref >60)
Glucose, Bld: 145 mg/dL — ABNORMAL HIGH (ref 70–99)
Potassium: 4.3 mmol/L (ref 3.5–5.1)
Sodium: 140 mmol/L (ref 135–145)
Total Bilirubin: 0.5 mg/dL (ref 0.3–1.2)
Total Protein: 6.5 g/dL (ref 6.5–8.1)

## 2021-04-29 LAB — LACTATE DEHYDROGENASE: LDH: 694 U/L — ABNORMAL HIGH (ref 98–192)

## 2021-04-29 NOTE — Progress Notes (Signed)
?  [place] ?OFFICE PROGRESS NOTE ? ? ?Diagnosis: Anemia ? ?INTERVAL HISTORY:  ? ?[name] [name] returns as scheduled.  She has been diagnosed with recurrent atrial fibrillation.  She has been seen in the emergency room and by cardiology.  She is scheduled for a cardiac CT later this week.  She has undergone a cardioversion and is now maintained on amiodarone.  She has malaise when she is in atrial fibrillation. ?No fever or night sweats. ? ?Objective: ? ?Vital signs in last 24 hours: ? ?Blood pressure 130/70, pulse 80, temperature 98.2 ?F (36.8 ?C), temperature source Oral, resp. rate 18, height $RemoveBe'5\' 7"'kfaKcBfBJ$  (1.702 m), weight 183 lb 9.6 oz (83.3 kg), SpO2 98 %. ? ? ?Resp: Lungs clear bilaterally ?Cardio: Irregular ?GI: No hepatosplenomegaly ?Vascular: No leg edema ?  ? ? ?Lab Results: ? ?Lab Results  ?Component Value Date  ? WBC 2.7 (L) 04/29/2021  ? HGB 11.2 (L) 04/29/2021  ? HCT 35.6 (L) 04/29/2021  ? MCV 96.0 04/29/2021  ? PLT 112 (L) 04/29/2021  ? NEUTROABS 1.0 (L) 04/29/2021  ? ? ?CMP  ?Lab Results  ?Component Value Date  ? NA 140 04/29/2021  ? K 4.3 04/29/2021  ? CL 106 04/29/2021  ? CO2 26 04/29/2021  ? GLUCOSE 145 (H) 04/29/2021  ? BUN 21 04/29/2021  ? CREATININE 1.61 (H) 04/29/2021  ? CALCIUM 9.9 04/29/2021  ? PROT 6.5 04/29/2021  ? ALBUMIN 3.4 (L) 04/29/2021  ? AST 13 (L) 04/29/2021  ? ALT 6 04/29/2021  ? ALKPHOS 57 04/29/2021  ? BILITOT 0.5 04/29/2021  ? GFRNONAA 33 (L) 04/29/2021  ? GFRAA 46 (L) 11/15/2017  ? ? ?Medications: I have reviewed the patient's current medications. ? ? ?Assessment/Plan: ?Macrocytic anemia/thrombocytopenia ?08/13/2020 B12 3500 ?08/13/2020 LDH 612 ?08/13/2020 myeloma panel-no monoclonal protein  ?Bone marrow biopsy 08/20/2020-hypercellular marrow with erythroid hyperplasia and dyspoiesis, rare lipogranuloma like lesions.  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  Negative myeloma FISH panel, 46XX ?[facility] screen - 08/26/2020 ?CTs 02/17/2021-new splenomegaly; diffuse  bilateral bronchial wall thickening; clustered groundglass and fine nodular opacity in the dependent right lower lobe.  CAD. ?Fatigue ?Dyspnea on exertion ?Unsteady gait/balance disorder, followed by Dr. Tomi Likens ?CKD ?Atrial fibrillation ?Hypertension ?Sleep apnea ?Anorexia/weight loss ? ? ? ?Disposition: ?[name] [name] appears stable from a hematologic standpoint.  She has mild pancytopenia, likely secondary to a myelodysplastic/myeloproliferative disorder.  The plan is to continue observation.  She will return for an office and lab visit in 6 weeks. ? ?[name] [name] will follow-up with cardiology for management of atrial fibrillation. ? ?Betsy Coder, MD ? ?04/29/2021  ?10:20 AM ? ? ?

## 2021-05-01 ENCOUNTER — Telehealth (HOSPITAL_COMMUNITY): Payer: Self-pay | Admitting: Emergency Medicine

## 2021-05-01 NOTE — Telephone Encounter (Signed)
Reaching out to patient to offer assistance regarding upcoming cardiac imaging study; pt verbalizes understanding of appt date/time, parking situation and where to check in, pre-test NPO status and medications ordered, and verified current allergies; name and call back number provided for further questions should they arise ?Marchia Bond RN Navigator Cardiac Imaging ?[facility] ?[phone] office ?[phone] cell ? ?Denies iv issues ?Daily meds, holding lasix ?Arrival 900 ? ?

## 2021-05-02 ENCOUNTER — Ambulatory Visit (HOSPITAL_COMMUNITY)
Admission: RE | Admit: 2021-05-02 | Discharge: 2021-05-02 | Disposition: A | Discharge status: Home / Self Care | Payer: Medicare HMO | Source: Ambulatory Visit | Attending: Cardiology | Admitting: Cardiology

## 2021-05-02 ENCOUNTER — Encounter (HOSPITAL_COMMUNITY): Payer: Self-pay

## 2021-05-02 DIAGNOSIS — I10 Essential (primary) hypertension: Secondary | ICD-10-CM

## 2021-05-02 DIAGNOSIS — D469 Myelodysplastic syndrome, unspecified: Secondary | ICD-10-CM

## 2021-05-02 DIAGNOSIS — Z01818 Encounter for other preprocedural examination: Secondary | ICD-10-CM

## 2021-05-02 DIAGNOSIS — D649 Anemia, unspecified: Secondary | ICD-10-CM

## 2021-05-02 DIAGNOSIS — I4819 Other persistent atrial fibrillation: Secondary | ICD-10-CM

## 2021-05-02 NOTE — Progress Notes (Signed)
Pts HR 107-120's, BP 104/66. Dr. Harriet Masson informed, CT scan to be rescheduled.  ?

## 2021-05-05 ENCOUNTER — Ambulatory Visit (HOSPITAL_COMMUNITY): Payer: Medicare HMO | Admitting: Physician Assistant

## 2021-05-05 ENCOUNTER — Ambulatory Visit (HOSPITAL_COMMUNITY)
Admission: RE | Admit: 2021-05-05 | Discharge: 2021-05-05 | Disposition: A | Discharge status: Home / Self Care | Payer: Medicare HMO | Source: Ambulatory Visit | Attending: Physician Assistant | Admitting: Physician Assistant

## 2021-05-05 VITALS — BP 106/62 | HR 112 | Ht 67.0 in | Wt 187.4 lb

## 2021-05-05 DIAGNOSIS — I129 Hypertensive chronic kidney disease with stage 1 through stage 4 chronic kidney disease, or unspecified chronic kidney disease: Secondary | ICD-10-CM | POA: Diagnosis not present

## 2021-05-05 DIAGNOSIS — Z79899 Other long term (current) drug therapy: Secondary | ICD-10-CM | POA: Diagnosis not present

## 2021-05-05 DIAGNOSIS — D6869 Other thrombophilia: Secondary | ICD-10-CM | POA: Diagnosis not present

## 2021-05-05 DIAGNOSIS — N189 Chronic kidney disease, unspecified: Secondary | ICD-10-CM | POA: Insufficient documentation

## 2021-05-05 DIAGNOSIS — Z8249 Family history of ischemic heart disease and other diseases of the circulatory system: Secondary | ICD-10-CM | POA: Diagnosis not present

## 2021-05-05 DIAGNOSIS — Z7901 Long term (current) use of anticoagulants: Secondary | ICD-10-CM | POA: Diagnosis not present

## 2021-05-05 DIAGNOSIS — I4819 Other persistent atrial fibrillation: Secondary | ICD-10-CM | POA: Diagnosis not present

## 2021-05-05 DIAGNOSIS — G4733 Obstructive sleep apnea (adult) (pediatric): Secondary | ICD-10-CM | POA: Insufficient documentation

## 2021-05-05 DIAGNOSIS — E785 Hyperlipidemia, unspecified: Secondary | ICD-10-CM | POA: Insufficient documentation

## 2021-05-05 LAB — BASIC METABOLIC PANEL
Anion gap: 5 (ref 5–15)
BUN: 15 mg/dL (ref 8–23)
CO2: 29 mmol/L (ref 22–32)
Calcium: 9.5 mg/dL (ref 8.9–10.3)
Chloride: 109 mmol/L (ref 98–111)
Creatinine, Ser: 1.56 mg/dL — ABNORMAL HIGH (ref 0.44–1.00)
GFR, Estimated: 34 mL/min — ABNORMAL LOW (ref >60)
Glucose, Bld: 94 mg/dL (ref 70–99)
Potassium: 4.8 mmol/L (ref 3.5–5.1)
Sodium: 143 mmol/L (ref 135–145)

## 2021-05-05 LAB — CBC
HCT: 35.6 % — ABNORMAL LOW (ref 36.0–46.0)
Hemoglobin: 10.9 g/dL — ABNORMAL LOW (ref 12.0–15.0)
MCH: 29.9 pg (ref 26.0–34.0)
MCHC: 30.6 g/dL (ref 30.0–36.0)
MCV: 97.8 fL (ref 80.0–100.0)
Platelets: 189 10*3/uL (ref 150–400)
RBC: 3.64 MIL/uL — ABNORMAL LOW (ref 3.87–5.11)
RDW: 16.1 % — ABNORMAL HIGH (ref 11.5–15.5)
WBC: 2.8 10*3/uL — ABNORMAL LOW (ref 4.0–10.5)
nRBC: 0 % (ref 0.0–0.2)

## 2021-05-05 MED ORDER — AMIODARONE HCL 200 MG PO TABS: ORAL_TABLET | ORAL | Status: DC

## 2021-05-05 NOTE — Patient Instructions (Signed)
Hold amlodipine ? ?Increase metoprolol at '25mg'$  once a day ? ? ?Cardioversion scheduled for Thursday, May 4th ? - Arrive at the [facility] and go to admitting at 730am ? - Do not eat or drink anything after midnight the night prior to your procedure. ? - Take all your morning medication (except diabetic medications) with a sip of water prior to arrival. ? - You will not be able to drive home after your procedure. ? - Do NOT miss any doses of your blood thinner - if you should miss a dose please notify our office immediately. ? - If you feel as if you go back into normal rhythm prior to scheduled cardioversion, please notify our office immediately. If your procedure is canceled in the cardioversion suite you will be charged a cancellation fee. ? ?

## 2021-05-05 NOTE — Progress Notes (Signed)
? ? ?Primary Care Physician: Harlan Stains, MD ?Primary Cardiologist: Dr Marlou Porch ?Primary Electrophysiologist: Dr Quentin Ore ?Referring Physician: Dr Quentin Ore ? ? ?[name] [name] is a 76 y.o. female with a history of CKD, HTN, HLD, MDS, OSA, atrial fibrillation who presents for follow up in the [facility]. The patient was initially diagnosed with atrial fibrillation remotely and had been maintained on amiodarone. This was discontinued for possible side effects. It was later felt that these symptoms were due to possible MDS and not amiodarone. The medication was resumed 04/18/21. She has had several ED visits for rapid afib requiring DCCV, most recently 04/20/21. She was back in afib the very next day. She has symptoms of fatigue and lightheadedness while in afib. Patient is on Eliquis for a CHADS2VASC score of 6. ? ?On follow up today, patient reports her dizziness improved with slower heart rates. She resumed amlodipine and decreased metoprolol back to her baseline dose. She was tachycardic when she presented for her cardiac CT and the procedure was cancelled. Remains in rapid afib today. ? ?Today, she denies symptoms of palpitations, chest pain, shortness of breath, orthopnea, PND, lower extremity edema, presyncope, syncope, bleeding, or neurologic sequela. The patient is tolerating medications without difficulties and is otherwise without complaint today.  ? ? ?Atrial Fibrillation Risk Factors: ? ?she does have symptoms or diagnosis of sleep apnea. ?she is not compliant with CPAP therapy. ?she does not have a history of rheumatic fever. ? ? ?she has a BMI of Body mass index is 29.35 kg/m?Marland KitchenMarland Kitchen ?Filed Weights  ? 05/05/21 1031  ?Weight: 85 kg  ? ? ? ?Family History  ?Problem Relation Age of Onset  ? Colon cancer Mother   ? Heart disease Father   ? Breast cancer Neg Hx   ? Thyroid disease Neg Hx   ? ? ? ?Atrial Fibrillation Management history: ? ?Previous antiarrhythmic drugs: amiodarone  ?Previous  cardioversions: 2018 x 2, 03/22/21, 04/07/21, 04/20/21 ?Previous ablations: none ?CHADS2VASC score: 6 ?Anticoagulation history: Eliquis ? ? ?Past Medical History:  ?Diagnosis Date  ? A-fib ([place])   ? paroxysmal  ? Anxiety   ? Arthritis   ? Cataracts, bilateral   ? immature  ? CKD (chronic kidney disease) stage 3, GFR 30-59 ml/min (HCC)   ? Depression   ? GERD (gastroesophageal reflux disease)   ? was on Nexium   ? GERD (gastroesophageal reflux disease)   ? Hemorrhoid   ? History of bronchitis   ? couple of yrs ago  ? History of colon polyps   ? History of peristent atrial fibrillation   ? Hyperlipidemia   ? takes Pravastatin daily  ? Hypertension   ? takes Amlodipine and Diovan daily  ? Hypertension   ? Pre-diabetes   ? Sleep apnea   ? no cpap used  ? Sleep apnea   ? study done 20+yrs ago and doesn't use cpap  ? Urinary urgency   ? Weakness   ? in left leg r/t back  ? ?Past Surgical History:  ?Procedure Laterality Date  ? ABDOMINAL HYSTERECTOMY  1980  ? partial  ? ABDOMINAL HYSTERECTOMY    ? partial  ? BACK SURGERY    ? CARDIOVERSION N/A 02/24/2016  ? Procedure: CARDIOVERSION;  Surgeon: Fay Records, MD;  Location: [place];  Service: Cardiovascular;  Laterality: N/A;  ? CARDIOVERSION N/A 03/19/2016  ? Procedure: CARDIOVERSION;  Surgeon: Fay Records, MD;  Location: [place];  Service: Cardiovascular;  Laterality: N/A;  ? CARPAL  TUNNEL RELEASE    ? CHOLECYSTECTOMY  1975  ? CHOLECYSTECTOMY    ? COLONOSCOPY    ? COLONOSCOPY WITH PROPOFOL  12/29/2011  ? Procedure: COLONOSCOPY WITH PROPOFOL;  Surgeon: Lear Ng, MD;  [facility];  Service: Endoscopy;  Laterality: N/A;  ? COLONOSCOPY WITH PROPOFOL N/A 05/24/2014  ? Procedure: COLONOSCOPY WITH PROPOFOL;  Surgeon: Wilford Corner, MD;  [facility];  Service: Endoscopy;  Laterality: N/A;  ? ESOPHAGEAL MANOMETRY N/A 02/22/2017  ? Procedure: ESOPHAGEAL MANOMETRY (EM);  Surgeon: Wilford Corner, MD;  [facility];  Service: Endoscopy;   Laterality: N/A;  ? ESOPHAGOGASTRODUODENOSCOPY    ? FOOT SURGERY    ? left, right  ? FRACTURE SURGERY    ? left leg-knee  ? HEEL SPUR EXCISION Bilateral   ? HOT HEMOSTASIS  12/29/2011  ? Procedure: HOT HEMOSTASIS (ARGON PLASMA COAGULATION/BICAP);  Surgeon: Lear Ng, MD;  Location: [facility];  Service: Endoscopy;  Laterality: N/A;  ? HOT HEMOSTASIS N/A 05/24/2014  ? Procedure: HOT HEMOSTASIS (ARGON PLASMA COAGULATION/BICAP);  Surgeon: Wilford Corner, MD;  Location: [facility];  Service: Endoscopy;  Laterality: N/A;  ? leg surgery d/t break Left   ? LUMBAR LAMINECTOMY/DECOMPRESSION MICRODISCECTOMY Left 04/05/2013  ? Procedure: LUMBAR FOUR TO FIVE LUMBAR LAMINECTOMY/DECOMPRESSION MICRODISCECTOMY 1 LEVEL;  Surgeon: Eustace Moore, MD;  Location: [place] [facility];  Service: Neurosurgery;  Laterality: Left;  ? NISSEN FUNDOPLICATION    ? [place] IMPEDANCE STUDY N/A 02/22/2017  ? Procedure: [place] IMPEDANCE STUDY;  Surgeon: Wilford Corner, MD;  [facility];  Service: Endoscopy;  Laterality: N/A;  ? TEE WITHOUT CARDIOVERSION N/A 02/24/2016  ? Procedure: TRANSESOPHAGEAL ECHOCARDIOGRAM (TEE);  Surgeon: Fay Records, MD;  Location: [place];  Service: Cardiovascular;  Laterality: N/A;  ? TOTAL KNEE ARTHROPLASTY Left 10/07/2016  ? TOTAL KNEE ARTHROPLASTY Left 10/07/2016  ? Procedure: TOTAL KNEE ARTHROPLASTY;  Surgeon: [name], MD;  Location: [place];  Service: Orthopedics;  Laterality: Left;  ? ? ?Current Outpatient Medications  ?Medication Sig Dispense Refill  ? albuterol (VENTOLIN HFA) 108 (90 Base) MCG/ACT inhaler Inhale 1-2 puffs into the lungs every 4 (four) hours as needed for wheezing or shortness of breath. 1 g 1  ? ALPRAZolam (XANAX) 0.5 MG tablet Take 1 tablet (0.5 mg total) by mouth 3 (three) times daily as needed for anxiety. 40 tablet 0  ? amLODipine (NORVASC) 2.5 MG tablet Take 2.5 mg by mouth daily.    ? apixaban (ELIQUIS) 5 MG TABS tablet Take 1 tablet (5 mg total) by mouth 2 (two) times  daily. 180 tablet 3  ? Ascorbic Acid (VITAMIN C WITH ROSE HIPS) 1000 MG tablet Take 1,000 mg by mouth daily.    ? benzonatate (TESSALON) 200 MG capsule Take 1 capsule by mouth three times daily as needed for cough 60 capsule 0  ? Biotin 5 MG TBDP Take 1 tablet by mouth every morning.    ? buPROPion (WELLBUTRIN SR) 200 MG 12 hr tablet Take 300 mg by mouth daily.    ? CALCIUM PO Take 1 tablet by mouth every morning.    ? Cholecalciferol (VITAMIN D) 2000 UNITS tablet Take 4,000 Units by mouth daily.    ? Coenzyme Q10-Vitamin E (QUNOL ULTRA COQ10 PO) Take 1 capsule by mouth every morning.    ? CVS MELATONIN PO Take 1 tablet by mouth at bedtime as needed.    ? fexofenadine (ALLEGRA) 180 MG tablet Take 180 mg by mouth daily as needed for allergies  or rhinitis.    ? furosemide (LASIX) 20 MG tablet TAKE 1 TABLET EVERY DAY 90 tablet 3  ? levothyroxine (SYNTHROID) 75 MCG tablet TAKE 1 TABLET DAILY BEFORE BREAKFAST (NEED APPOINTMENT FOR FURTHER REFILLS) 90 tablet 0  ? metoprolol succinate (TOPROL-XL) 25 MG 24 hr tablet Take 1 tablet (25 mg total) by mouth daily. 45 tablet 3  ? potassium chloride SA (KLOR-CON M) 20 MEQ tablet TAKE 1 TABLET EVERY DAY 90 tablet 2  ? pravastatin (PRAVACHOL) 40 MG tablet Take 40 mg by mouth at bedtime.    ? Spacer/Aero-Holding Chambers (AEROCHAMBER MV) inhaler Use as instructed 1 each 0  ? amiodarone (PACERONE) 200 MG tablet Taking one tablet by mouth daily    ? ?No current facility-administered medications for this encounter.  ? ? ?Allergies  ?Allergen Reactions  ? Omnicef [Cefdinir] Swelling  ?  Tongue swelling  ? Bacitracin   ?  Other reaction(s): Rash  ? Bacitracin-Polymyxin B   ?  Other reaction(s): Rash  ? Duloxetine Hcl   ?  Other reaction(s): felt funny- thinking messed up  ? Lexapro [Escitalopram]   ?  Makes her feel funny and sleepy  ? Mirtazapine   ?  Other reaction(s): weight gain  ? Penicillins   ?  Pt cannot remember reaction she had as hadn't been on it in about 20 years but stated  that she knows she is allergic to it.  ? Peroxide [Hydrogen Peroxide] Other (See Comments)  ?  Redness.   ? Zocor [Simvastatin] Other (See Comments)  ?  Muscle pains  ? Zoloft [Sertraline Hcl]   ?  Ma

## 2021-05-05 NOTE — H&P (View-Only) (Signed)
? ? ?Primary Care Physician: Harlan Stains, MD ?Primary Cardiologist: Dr Marlou Porch ?Primary Electrophysiologist: Dr Quentin Ore ?Referring Physician: Dr Quentin Ore ? ? ?[name] [name] is a 76 y.o. female with a history of CKD, HTN, HLD, MDS, OSA, atrial fibrillation who presents for follow up in the [facility]. The patient was initially diagnosed with atrial fibrillation remotely and had been maintained on amiodarone. This was discontinued for possible side effects. It was later felt that these symptoms were due to possible MDS and not amiodarone. The medication was resumed 04/18/21. She has had several ED visits for rapid afib requiring DCCV, most recently 04/20/21. She was back in afib the very next day. She has symptoms of fatigue and lightheadedness while in afib. Patient is on Eliquis for a CHADS2VASC score of 6. ? ?On follow up today, patient reports her dizziness improved with slower heart rates. She resumed amlodipine and decreased metoprolol back to her baseline dose. She was tachycardic when she presented for her cardiac CT and the procedure was cancelled. Remains in rapid afib today. ? ?Today, she denies symptoms of palpitations, chest pain, shortness of breath, orthopnea, PND, lower extremity edema, presyncope, syncope, bleeding, or neurologic sequela. The patient is tolerating medications without difficulties and is otherwise without complaint today.  ? ? ?Atrial Fibrillation Risk Factors: ? ?she does have symptoms or diagnosis of sleep apnea. ?she is not compliant with CPAP therapy. ?she does not have a history of rheumatic fever. ? ? ?she has a BMI of Body mass index is 29.35 kg/m?Marland KitchenMarland Kitchen ?Filed Weights  ? 05/05/21 1031  ?Weight: 85 kg  ? ? ? ?Family History  ?Problem Relation Age of Onset  ? Colon cancer Mother   ? Heart disease Father   ? Breast cancer Neg Hx   ? Thyroid disease Neg Hx   ? ? ? ?Atrial Fibrillation Management history: ? ?Previous antiarrhythmic drugs: amiodarone  ?Previous  cardioversions: 2018 x 2, 03/22/21, 04/07/21, 04/20/21 ?Previous ablations: none ?CHADS2VASC score: 6 ?Anticoagulation history: Eliquis ? ? ?Past Medical History:  ?Diagnosis Date  ? A-fib ([place])   ? paroxysmal  ? Anxiety   ? Arthritis   ? Cataracts, bilateral   ? immature  ? CKD (chronic kidney disease) stage 3, GFR 30-59 ml/min (HCC)   ? Depression   ? GERD (gastroesophageal reflux disease)   ? was on Nexium   ? GERD (gastroesophageal reflux disease)   ? Hemorrhoid   ? History of bronchitis   ? couple of yrs ago  ? History of colon polyps   ? History of peristent atrial fibrillation   ? Hyperlipidemia   ? takes Pravastatin daily  ? Hypertension   ? takes Amlodipine and Diovan daily  ? Hypertension   ? Pre-diabetes   ? Sleep apnea   ? no cpap used  ? Sleep apnea   ? study done 20+yrs ago and doesn't use cpap  ? Urinary urgency   ? Weakness   ? in left leg r/t back  ? ?Past Surgical History:  ?Procedure Laterality Date  ? ABDOMINAL HYSTERECTOMY  1980  ? partial  ? ABDOMINAL HYSTERECTOMY    ? partial  ? BACK SURGERY    ? CARDIOVERSION N/A 02/24/2016  ? Procedure: CARDIOVERSION;  Surgeon: Fay Records, MD;  Location: [place];  Service: Cardiovascular;  Laterality: N/A;  ? CARDIOVERSION N/A 03/19/2016  ? Procedure: CARDIOVERSION;  Surgeon: Fay Records, MD;  Location: [place];  Service: Cardiovascular;  Laterality: N/A;  ? CARPAL  TUNNEL RELEASE    ? CHOLECYSTECTOMY  1975  ? CHOLECYSTECTOMY    ? COLONOSCOPY    ? COLONOSCOPY WITH PROPOFOL  12/29/2011  ? Procedure: COLONOSCOPY WITH PROPOFOL;  Surgeon: Lear Ng, MD;  [facility];  Service: Endoscopy;  Laterality: N/A;  ? COLONOSCOPY WITH PROPOFOL N/A 05/24/2014  ? Procedure: COLONOSCOPY WITH PROPOFOL;  Surgeon: Wilford Corner, MD;  [facility];  Service: Endoscopy;  Laterality: N/A;  ? ESOPHAGEAL MANOMETRY N/A 02/22/2017  ? Procedure: ESOPHAGEAL MANOMETRY (EM);  Surgeon: Wilford Corner, MD;  [facility];  Service: Endoscopy;   Laterality: N/A;  ? ESOPHAGOGASTRODUODENOSCOPY    ? FOOT SURGERY    ? left, right  ? FRACTURE SURGERY    ? left leg-knee  ? HEEL SPUR EXCISION Bilateral   ? HOT HEMOSTASIS  12/29/2011  ? Procedure: HOT HEMOSTASIS (ARGON PLASMA COAGULATION/BICAP);  Surgeon: Lear Ng, MD;  Location: [facility];  Service: Endoscopy;  Laterality: N/A;  ? HOT HEMOSTASIS N/A 05/24/2014  ? Procedure: HOT HEMOSTASIS (ARGON PLASMA COAGULATION/BICAP);  Surgeon: Wilford Corner, MD;  Location: [facility];  Service: Endoscopy;  Laterality: N/A;  ? leg surgery d/t break Left   ? LUMBAR LAMINECTOMY/DECOMPRESSION MICRODISCECTOMY Left 04/05/2013  ? Procedure: LUMBAR FOUR TO FIVE LUMBAR LAMINECTOMY/DECOMPRESSION MICRODISCECTOMY 1 LEVEL;  Surgeon: Eustace Moore, MD;  Location: [place] [facility];  Service: Neurosurgery;  Laterality: Left;  ? NISSEN FUNDOPLICATION    ? [place] IMPEDANCE STUDY N/A 02/22/2017  ? Procedure: [place] IMPEDANCE STUDY;  Surgeon: Wilford Corner, MD;  [facility];  Service: Endoscopy;  Laterality: N/A;  ? TEE WITHOUT CARDIOVERSION N/A 02/24/2016  ? Procedure: TRANSESOPHAGEAL ECHOCARDIOGRAM (TEE);  Surgeon: Fay Records, MD;  Location: [place];  Service: Cardiovascular;  Laterality: N/A;  ? TOTAL KNEE ARTHROPLASTY Left 10/07/2016  ? TOTAL KNEE ARTHROPLASTY Left 10/07/2016  ? Procedure: TOTAL KNEE ARTHROPLASTY;  Surgeon: [name], MD;  Location: [place];  Service: Orthopedics;  Laterality: Left;  ? ? ?Current Outpatient Medications  ?Medication Sig Dispense Refill  ? albuterol (VENTOLIN HFA) 108 (90 Base) MCG/ACT inhaler Inhale 1-2 puffs into the lungs every 4 (four) hours as needed for wheezing or shortness of breath. 1 g 1  ? ALPRAZolam (XANAX) 0.5 MG tablet Take 1 tablet (0.5 mg total) by mouth 3 (three) times daily as needed for anxiety. 40 tablet 0  ? amLODipine (NORVASC) 2.5 MG tablet Take 2.5 mg by mouth daily.    ? apixaban (ELIQUIS) 5 MG TABS tablet Take 1 tablet (5 mg total) by mouth 2 (two) times  daily. 180 tablet 3  ? Ascorbic Acid (VITAMIN C WITH ROSE HIPS) 1000 MG tablet Take 1,000 mg by mouth daily.    ? benzonatate (TESSALON) 200 MG capsule Take 1 capsule by mouth three times daily as needed for cough 60 capsule 0  ? Biotin 5 MG TBDP Take 1 tablet by mouth every morning.    ? buPROPion (WELLBUTRIN SR) 200 MG 12 hr tablet Take 300 mg by mouth daily.    ? CALCIUM PO Take 1 tablet by mouth every morning.    ? Cholecalciferol (VITAMIN D) 2000 UNITS tablet Take 4,000 Units by mouth daily.    ? Coenzyme Q10-Vitamin E (QUNOL ULTRA COQ10 PO) Take 1 capsule by mouth every morning.    ? [employer] MELATONIN PO Take 1 tablet by mouth at bedtime as needed.    ? fexofenadine (ALLEGRA) 180 MG tablet Take 180 mg by mouth daily as needed for allergies  or rhinitis.    ? furosemide (LASIX) 20 MG tablet TAKE 1 TABLET EVERY DAY 90 tablet 3  ? levothyroxine (SYNTHROID) 75 MCG tablet TAKE 1 TABLET DAILY BEFORE BREAKFAST (NEED APPOINTMENT FOR FURTHER REFILLS) 90 tablet 0  ? metoprolol succinate (TOPROL-XL) 25 MG 24 hr tablet Take 1 tablet (25 mg total) by mouth daily. 45 tablet 3  ? potassium chloride SA (KLOR-CON M) 20 MEQ tablet TAKE 1 TABLET EVERY DAY 90 tablet 2  ? pravastatin (PRAVACHOL) 40 MG tablet Take 40 mg by mouth at bedtime.    ? Spacer/Aero-Holding Chambers (AEROCHAMBER MV) inhaler Use as instructed 1 each 0  ? amiodarone (PACERONE) 200 MG tablet Taking one tablet by mouth daily    ? ?No current facility-administered medications for this encounter.  ? ? ?Allergies  ?Allergen Reactions  ? Omnicef [Cefdinir] Swelling  ?  Tongue swelling  ? Bacitracin   ?  Other reaction(s): Rash  ? Bacitracin-Polymyxin B   ?  Other reaction(s): Rash  ? Duloxetine Hcl   ?  Other reaction(s): felt funny- thinking messed up  ? Lexapro [Escitalopram]   ?  Makes her feel funny and sleepy  ? Mirtazapine   ?  Other reaction(s): weight gain  ? Penicillins   ?  Pt cannot remember reaction she had as hadn't been on it in about 20 years but stated  that she knows she is allergic to it.  ? Peroxide [Hydrogen Peroxide] Other (See Comments)  ?  Redness.   ? Zocor [Simvastatin] Other (See Comments)  ?  Muscle pains  ? Zoloft [Sertraline Hcl]   ?  Ma

## 2021-05-07 ENCOUNTER — Telehealth: Payer: Self-pay | Admitting: Cardiology

## 2021-05-07 ENCOUNTER — Telehealth (HOSPITAL_COMMUNITY): Payer: Self-pay | Admitting: Physician Assistant

## 2021-05-07 NOTE — Telephone Encounter (Signed)
Returned patient's call regarding cardioversion. She is concerned that her outpatient cardioversion is much more expensive than ED visit. She asks about how the procedure is coded. Provided CPT code for DCCV and explained reasoning for elective outpatient DCCV vs urgent DCCV in the ED. She is "feeling well" at this time so did not recommend going to ED right now. Patient became tearful and disconnected the call.  ? ?Stacy RN had spoken to her previously about setting up a payment plan if needed.  ?

## 2021-05-07 NOTE — Telephone Encounter (Signed)
? ?  Spoke to the patient regarding canceling her pre-[place] CT scan as she was seen by [facility] 05/05/21 and was found to be in atrial fibrillation with a HR at 112bpm. Plan was to pursue DCCV, now scheduled for 05/15/21. She is scheduled with Adline Peals, PA for post cardioversion follow up on 05/22/21. We will follow in the background and have her CT re-scheduled once she is back in NSR and better rate controlled.  ? ? ?Kathyrn Drown NP-C ?Structural Heart Team  ?Pager: [phone] ?Phone: [phone] ? ?

## 2021-05-12 ENCOUNTER — Encounter (HOSPITAL_COMMUNITY): Payer: Self-pay | Admitting: Internal Medicine

## 2021-05-14 ENCOUNTER — Ambulatory Visit: Payer: Medicare HMO | Admitting: Podiatry

## 2021-05-14 NOTE — Anesthesia Preprocedure Evaluation (Addendum)
Anesthesia Evaluation  ?Patient identified by MRN, date of birth, ID band ?Patient awake ? ? ? ?Reviewed: ?Allergy & Precautions, NPO status , Patient's Chart, lab work & pertinent test results ? ?History of Anesthesia Complications ?Negative for: history of anesthetic complications ? ?Airway ?Mallampati: II ? ?TM Distance: >3 FB ?Neck ROM: Full ? ? ? Dental ? ?(+) Dental Advisory Given, Implants ?  ?Pulmonary ?neg pulmonary ROS,  ?  ?Pulmonary exam normal ? ? ? ? ? ? ? Cardiovascular ?hypertension, Pt. on medications ?+ dysrhythmias Atrial Fibrillation  ?Rhythm:Irregular Rate:Tachycardia ? ?Left ventricular ejection fraction, by estimation, is 60 to 65%. The left ventricle has normal ?function. The left ventricle has no regional wall motion abnormalities. Left ventricular ?diastolic parameters were normal. ?2. Right ventricular systolic function is normal. The right ventricular size is normal. ?3. Left atrial size was moderately dilated. ?4. The mitral valve is degenerative. Mild mitral valve regurgitation. No evidence of mitral ?stenosis. Moderate mitral annular calcification. ?5. The aortic valve is normal in structure. Aortic valve regurgitation is not visualized. No aortic ?stenosis is present. ?6. The inferior vena cava is normal in size with greater than 50% respiratory variability, ?suggesting right atrial pressure of 3 mmHg. ?  ?Neuro/Psych ?PSYCHIATRIC DISORDERS Anxiety Depression negative neurological ROS ?   ? GI/Hepatic ?Neg liver ROS, GERD  ,  ?Endo/Other  ?Hypothyroidism  ? Renal/GU ?Renal InsufficiencyRenal disease  ? ?  ?Musculoskeletal ?negative musculoskeletal ROS ?(+)  ? Abdominal ?  ?Peds ? Hematology ? ?(+) Blood dyscrasia, anemia ,   ?Anesthesia Other Findings ? ? Reproductive/Obstetrics ? ?  ? ? ? ? ? ? ? ? ? ? ? ? ? ?  ?  ? ? ? ? ? ? ? ?Anesthesia Physical ?Anesthesia Plan ? ?ASA: 3 ? ?Anesthesia Plan: General  ? ?Post-op Pain Management: Minimal or no pain  anticipated  ? ?Induction: Intravenous ? ?PONV Risk Score and Plan: 3 and Ondansetron, Propofol infusion and Treatment may vary due to age or medical condition ? ?Airway Management Planned: Natural Airway ? ?Additional Equipment:  ? ?Intra-op Plan:  ? ?Post-operative Plan:  ? ?Informed Consent: I have reviewed the patients History and Physical, chart, labs and discussed the procedure including the risks, benefits and alternatives for the proposed anesthesia with the patient or authorized representative who has indicated his/her understanding and acceptance.  ? ? ? ?Dental advisory given ? ?Plan Discussed with: Anesthesiologist and CRNA ? ?Anesthesia Plan Comments:   ? ? ? ? ? ?Anesthesia Quick Evaluation ? ?

## 2021-05-15 ENCOUNTER — Ambulatory Visit (HOSPITAL_COMMUNITY): Payer: Medicare HMO | Admitting: Anesthesiology

## 2021-05-15 ENCOUNTER — Other Ambulatory Visit: Payer: Self-pay

## 2021-05-15 ENCOUNTER — Ambulatory Visit (HOSPITAL_COMMUNITY)
Admission: RE | Admit: 2021-05-15 | Discharge: 2021-05-15 | Disposition: A | Discharge status: Home / Self Care | Payer: Medicare HMO | Attending: Internal Medicine | Admitting: Internal Medicine

## 2021-05-15 ENCOUNTER — Encounter (HOSPITAL_COMMUNITY): Payer: Self-pay | Admitting: Internal Medicine

## 2021-05-15 ENCOUNTER — Ambulatory Visit (HOSPITAL_BASED_OUTPATIENT_CLINIC_OR_DEPARTMENT_OTHER): Payer: Medicare HMO | Admitting: Anesthesiology

## 2021-05-15 ENCOUNTER — Encounter (HOSPITAL_COMMUNITY)
Admission: RE | Disposition: A | Discharge status: Home / Self Care | Payer: Self-pay | Source: Home / Self Care | Attending: Internal Medicine

## 2021-05-15 DIAGNOSIS — G4733 Obstructive sleep apnea (adult) (pediatric): Secondary | ICD-10-CM | POA: Insufficient documentation

## 2021-05-15 DIAGNOSIS — Z7901 Long term (current) use of anticoagulants: Secondary | ICD-10-CM | POA: Insufficient documentation

## 2021-05-15 DIAGNOSIS — I10 Essential (primary) hypertension: Secondary | ICD-10-CM

## 2021-05-15 DIAGNOSIS — Z79899 Other long term (current) drug therapy: Secondary | ICD-10-CM | POA: Diagnosis not present

## 2021-05-15 DIAGNOSIS — D638 Anemia in other chronic diseases classified elsewhere: Secondary | ICD-10-CM | POA: Diagnosis not present

## 2021-05-15 DIAGNOSIS — D6869 Other thrombophilia: Secondary | ICD-10-CM | POA: Diagnosis not present

## 2021-05-15 DIAGNOSIS — I4819 Other persistent atrial fibrillation: Secondary | ICD-10-CM | POA: Insufficient documentation

## 2021-05-15 DIAGNOSIS — I4891 Unspecified atrial fibrillation: Secondary | ICD-10-CM | POA: Diagnosis not present

## 2021-05-15 DIAGNOSIS — E039 Hypothyroidism, unspecified: Secondary | ICD-10-CM | POA: Diagnosis not present

## 2021-05-15 HISTORY — PX: CARDIOVERSION: SHX1299

## 2021-05-15 SURGERY — CARDIOVERSION
Anesthesia: General

## 2021-05-15 MED ORDER — PHENYLEPHRINE 80 MCG/ML (10ML) SYRINGE FOR IV PUSH (FOR BLOOD PRESSURE SUPPORT)
PREFILLED_SYRINGE | INTRAVENOUS | Status: DC | PRN
  Administered 2021-05-15: 160 ug via INTRAVENOUS

## 2021-05-15 MED ORDER — PROPOFOL 10 MG/ML IV BOLUS
INTRAVENOUS | Status: DC | PRN
  Administered 2021-05-15: 70 mg via INTRAVENOUS

## 2021-05-15 MED ORDER — SODIUM CHLORIDE 0.9 % IV SOLN
INTRAVENOUS | Status: DC
  Administered 2021-05-15: 500 mL via INTRAVENOUS

## 2021-05-15 MED ORDER — LIDOCAINE 2% (20 MG/ML) 5 ML SYRINGE
INTRAMUSCULAR | Status: DC | PRN
  Administered 2021-05-15: 100 mg via INTRAVENOUS

## 2021-05-15 NOTE — Discharge Instructions (Signed)

## 2021-05-15 NOTE — Transfer of Care (Signed)
Immediate Anesthesia Transfer of Care Note ? ?Patient: [name] [name] ? ?Procedure(s) Performed: CARDIOVERSION ? ?Patient Location: Endoscopy Unit ? ?Anesthesia Type:General ? ?Level of Consciousness: awake and drowsy ? ?Airway & Oxygen Therapy: Patient Spontanous Breathing ? ?Post-op Assessment: Report given to RN and Post -op Vital signs reviewed and stable ? ?Post vital signs: Reviewed and stable ? ?Last Vitals:  ?Vitals Value Taken Time  ?BP    ?Temp    ?Pulse 56 05/15/21 0827  ?Resp 26 05/15/21 0827  ?SpO2 100 % 05/15/21 0827  ?Vitals shown include unvalidated device data. ? ?Last Pain:  ?Vitals:  ? 05/15/21 0735  ?PainSc: 0-No pain  ?   ? ?  ? ?Complications: No notable events documented. ?

## 2021-05-15 NOTE — Anesthesia Postprocedure Evaluation (Signed)
Anesthesia Post Note ? ?Patient: [name] [name] ? ?Procedure(s) Performed: CARDIOVERSION ? ?  ? ?Patient location during evaluation: PACU ?Anesthesia Type: General ?Level of consciousness: sedated ?Pain management: pain level controlled ?Vital Signs Assessment: post-procedure vital signs reviewed and stable ?Respiratory status: spontaneous breathing and respiratory function stable ?Cardiovascular status: stable ?Postop Assessment: no apparent nausea or vomiting ?Anesthetic complications: no ? ? ?No notable events documented. ? ?Last Vitals:  ?Vitals:  ? 05/15/21 0856 05/15/21 0904  ?BP: (!) 91/49 (!) 90/43  ?Pulse: (!) 58 (!) 58  ?Resp: 20 19  ?Temp:    ?SpO2: 99% 96%  ?  ?Last Pain:  ?Vitals:  ? 05/15/21 0856  ?TempSrc:   ?PainSc: 0-No pain  ? ? ?  ?  ?  ?  ?  ?  ? ?Jannet Calip DANIEL ? ? ? ? ?

## 2021-05-15 NOTE — CV Procedure (Signed)
? ?  CARDIOVERSION NOTE ? ?Procedure: Electrical Cardioversion ?Indications:  Atrial Fibrillation ? ?Procedure Details: ? ?Consent: Risks of procedure as well as the alternatives and risks of each were explained to the (patient/caregiver).  Consent for procedure obtained. ? ?Time Out: Verified patient identification, verified procedure, site/side was marked, verified correct patient position, special equipment/implants available, medications/allergies/relevent history reviewed, required imaging and test results available.  Performed ? ?Patient placed on cardiac monitor, pulse oximetry, supplemental oxygen as necessary.  ?Sedation given:  per anesthesia ?Pacer pads placed anterior and posterior chest. ? ?Cardioverted 1 time(s).  ?Cardioverted at 150J biphasic. ? ?Impression: ?Findings: Post procedure EKG shows: NSR ?Complications: None ?Patient did tolerate procedure well. ? ?Plan: ?Successful DCCV with a single 150J biphasic shock to NSR. ? ?Time Spent Directly with the Patient: ? ?30 minutes  ? ?Pixie Casino, MD, [facility], FACP  ?[place]  ?Medical Director of the [facility] &  ?Card[facility] ?Diplomate of the [employer] of Clinical Lipidology ?Attending Cardiologist  ?Direct Dial: [phone]  Fax: [phone]  ?Website:  [email or web address] ? ?Nadean Corwin Jakevious Hollister ?05/15/2021, 8:24 AM ? ? ? ? ?

## 2021-05-15 NOTE — Interval H&P Note (Signed)
History and Physical Interval Note: ? ?05/15/2021 ?7:42 AM ? ?[name] [name]  has presented today for surgery, with the diagnosis of AFIB.  The various methods of treatment have been discussed with the patient and family. After consideration of risks, benefits and other options for treatment, the patient has consented to  Procedure(s): ?CARDIOVERSION (N/A) as a surgical intervention.  The patient's history has been reviewed, patient examined, no change in status, stable for surgery.  I have reviewed the patient's chart and labs.  Questions were answered to the patient's satisfaction.   ? ? ?[name] [name] ? ? ?

## 2021-05-16 ENCOUNTER — Encounter (HOSPITAL_COMMUNITY): Payer: Self-pay | Admitting: Internal Medicine

## 2021-05-20 ENCOUNTER — Ambulatory Visit (HOSPITAL_COMMUNITY): Admission: RE | Admit: 2021-05-20 | Payer: Medicare HMO | Source: Ambulatory Visit

## 2021-05-22 ENCOUNTER — Ambulatory Visit (HOSPITAL_COMMUNITY)
Admission: RE | Admit: 2021-05-22 | Discharge: 2021-05-22 | Disposition: A | Discharge status: Home / Self Care | Payer: Medicare HMO | Source: Ambulatory Visit | Attending: Physician Assistant | Admitting: Physician Assistant

## 2021-05-22 ENCOUNTER — Encounter (HOSPITAL_COMMUNITY): Payer: Self-pay | Admitting: Physician Assistant

## 2021-05-22 VITALS — BP 112/64 | HR 52 | Ht 67.0 in | Wt 188.6 lb

## 2021-05-22 DIAGNOSIS — Z79899 Other long term (current) drug therapy: Secondary | ICD-10-CM | POA: Insufficient documentation

## 2021-05-22 DIAGNOSIS — G4733 Obstructive sleep apnea (adult) (pediatric): Secondary | ICD-10-CM | POA: Diagnosis not present

## 2021-05-22 DIAGNOSIS — D6869 Other thrombophilia: Secondary | ICD-10-CM | POA: Diagnosis not present

## 2021-05-22 DIAGNOSIS — I4819 Other persistent atrial fibrillation: Secondary | ICD-10-CM

## 2021-05-22 DIAGNOSIS — I1310 Hypertensive heart and chronic kidney disease without heart failure, with stage 1 through stage 4 chronic kidney disease, or unspecified chronic kidney disease: Secondary | ICD-10-CM | POA: Diagnosis not present

## 2021-05-22 DIAGNOSIS — N183 Chronic kidney disease, stage 3 unspecified: Secondary | ICD-10-CM | POA: Insufficient documentation

## 2021-05-22 DIAGNOSIS — Z91199 Patient's noncompliance with other medical treatment and regimen due to unspecified reason: Secondary | ICD-10-CM | POA: Insufficient documentation

## 2021-05-22 DIAGNOSIS — Z7901 Long term (current) use of anticoagulants: Secondary | ICD-10-CM | POA: Diagnosis not present

## 2021-05-22 MED ORDER — AMIODARONE HCL 200 MG PO TABS: ORAL_TABLET | ORAL | 1 refills | Status: DC

## 2021-05-22 NOTE — Progress Notes (Signed)
? ? ?Primary Care Physician: Harlan Stains, MD ?Primary Cardiologist: Dr Marlou Porch ?Primary Electrophysiologist: Dr Quentin Ore ?Referring Physician: Dr Quentin Ore ? ? ?[name] [name] is a 76 y.o. female with a history of CKD, HTN, HLD, MDS, OSA, atrial fibrillation who presents for follow up in the [facility]. The patient was initially diagnosed with atrial fibrillation remotely and had been maintained on amiodarone. This was discontinued for possible side effects. It was later felt that these symptoms were due to possible MDS and not amiodarone. The medication was resumed 04/18/21. She has had several ED visits for rapid afib requiring DCCV, most recently 04/20/21. She was back in afib the very next day. She has symptoms of fatigue and lightheadedness while in afib. Patient is on Eliquis for a CHADS2VASC score of 6. ? ?On follow up today, patient is s/p DCCV on 05/15/21. She is in SR today. She does feel improved with a little more energy. No current bleeding issues with anticoagulation.  ? ?Today, she denies symptoms of palpitations, chest pain, shortness of breath, orthopnea, PND, lower extremity edema, presyncope, syncope, bleeding, or neurologic sequela. The patient is tolerating medications without difficulties and is otherwise without complaint today.  ? ? ?Atrial Fibrillation Risk Factors: ? ?she does have symptoms or diagnosis of sleep apnea. ?she is not compliant with CPAP therapy. ?she does not have a history of rheumatic fever. ? ? ?she has a BMI of Body mass index is 29.54 kg/m?Marland KitchenMarland Kitchen ?Filed Weights  ? 05/22/21 1121  ?Weight: 85.5 kg  ? ? ? ?Family History  ?Problem Relation Age of Onset  ? Colon cancer Mother   ? Heart disease Father   ? Breast cancer Neg Hx   ? Thyroid disease Neg Hx   ? ? ? ?Atrial Fibrillation Management history: ? ?Previous antiarrhythmic drugs: amiodarone  ?Previous cardioversions: 2018 x 2, 03/22/21, 04/07/21, 04/20/21, 05/15/21 ?Previous ablations: none ?CHADS2VASC score:  6 ?Anticoagulation history: Eliquis ? ? ?Past Medical History:  ?Diagnosis Date  ? A-fib ([place])   ? paroxysmal  ? Anxiety   ? Arthritis   ? Cataracts, bilateral   ? immature  ? CKD (chronic kidney disease) stage 3, GFR 30-59 ml/min (HCC)   ? Depression   ? GERD (gastroesophageal reflux disease)   ? was on Nexium   ? GERD (gastroesophageal reflux disease)   ? Hemorrhoid   ? History of bronchitis   ? couple of yrs ago  ? History of colon polyps   ? History of peristent atrial fibrillation   ? Hyperlipidemia   ? takes Pravastatin daily  ? Hypertension   ? takes Amlodipine and Diovan daily  ? Hypertension   ? Pre-diabetes   ? Sleep apnea   ? no cpap used  ? Sleep apnea   ? study done 20+yrs ago and doesn't use cpap  ? Urinary urgency   ? Weakness   ? in left leg r/t back  ? ?Past Surgical History:  ?Procedure Laterality Date  ? ABDOMINAL HYSTERECTOMY  1980  ? partial  ? ABDOMINAL HYSTERECTOMY    ? partial  ? BACK SURGERY    ? CARDIOVERSION N/A 02/24/2016  ? Procedure: CARDIOVERSION;  Surgeon: Fay Records, MD;  Location: [place];  Service: Cardiovascular;  Laterality: N/A;  ? CARDIOVERSION N/A 03/19/2016  ? Procedure: CARDIOVERSION;  Surgeon: Fay Records, MD;  Location: [place];  Service: Cardiovascular;  Laterality: N/A;  ? CARDIOVERSION N/A 05/15/2021  ? Procedure: CARDIOVERSION;  Surgeon: Pixie Casino, MD;  Location: [facility];  Service: Cardiovascular;  Laterality: N/A;  ? CARPAL TUNNEL RELEASE    ? CHOLECYSTECTOMY  1975  ? CHOLECYSTECTOMY    ? COLONOSCOPY    ? COLONOSCOPY WITH PROPOFOL  12/29/2011  ? Procedure: COLONOSCOPY WITH PROPOFOL;  Surgeon: Lear Ng, MD;  [facility]: [facility];  Service: Endoscopy;  Laterality: N/A;  ? COLONOSCOPY WITH PROPOFOL N/A 05/24/2014  ? Procedure: COLONOSCOPY WITH PROPOFOL;  Surgeon: Wilford Corner, MD;  [facility];  Service: Endoscopy;  Laterality: N/A;  ? ESOPHAGEAL MANOMETRY N/A 02/22/2017  ? Procedure: ESOPHAGEAL MANOMETRY (EM);  Surgeon:  Wilford Corner, MD;  [facility];  Service: Endoscopy;  Laterality: N/A;  ? ESOPHAGOGASTRODUODENOSCOPY    ? FOOT SURGERY    ? left, right  ? FRACTURE SURGERY    ? left leg-knee  ? HEEL SPUR EXCISION Bilateral   ? HOT HEMOSTASIS  12/29/2011  ? Procedure: HOT HEMOSTASIS (ARGON PLASMA COAGULATION/BICAP);  Surgeon: Lear Ng, MD;  Location: [facility];  Service: Endoscopy;  Laterality: N/A;  ? HOT HEMOSTASIS N/A 05/24/2014  ? Procedure: HOT HEMOSTASIS (ARGON PLASMA COAGULATION/BICAP);  Surgeon: Wilford Corner, MD;  Location: [facility];  Service: Endoscopy;  Laterality: N/A;  ? leg surgery d/t break Left   ? LUMBAR LAMINECTOMY/DECOMPRESSION MICRODISCECTOMY Left 04/05/2013  ? Procedure: LUMBAR FOUR TO FIVE LUMBAR LAMINECTOMY/DECOMPRESSION MICRODISCECTOMY 1 LEVEL;  Surgeon: Eustace Moore, MD;  Location: [place] [facility];  Service: Neurosurgery;  Laterality: Left;  ? NISSEN FUNDOPLICATION    ? [place] IMPEDANCE STUDY N/A 02/22/2017  ? Procedure: [place] IMPEDANCE STUDY;  Surgeon: Wilford Corner, MD;  [facility];  Service: Endoscopy;  Laterality: N/A;  ? TEE WITHOUT CARDIOVERSION N/A 02/24/2016  ? Procedure: TRANSESOPHAGEAL ECHOCARDIOGRAM (TEE);  Surgeon: Fay Records, MD;  Location: [place];  Service: Cardiovascular;  Laterality: N/A;  ? TOTAL KNEE ARTHROPLASTY Left 10/07/2016  ? TOTAL KNEE ARTHROPLASTY Left 10/07/2016  ? Procedure: TOTAL KNEE ARTHROPLASTY;  Surgeon: [name], MD;  Location: [place];  Service: Orthopedics;  Laterality: Left;  ? ? ?Current Outpatient Medications  ?Medication Sig Dispense Refill  ? albuterol (VENTOLIN HFA) 108 (90 Base) MCG/ACT inhaler Inhale 1-2 puffs into the lungs every 4 (four) hours as needed for wheezing or shortness of breath. 1 g 1  ? ALPRAZolam (XANAX) 0.5 MG tablet Take 1 tablet (0.5 mg total) by mouth 3 (three) times daily as needed for anxiety. 40 tablet 0  ? amiodarone (PACERONE) 200 MG tablet Taking one tablet by mouth daily    ? amLODipine  (NORVASC) 2.5 MG tablet Take 2.5 mg by mouth daily.    ? apixaban (ELIQUIS) 5 MG TABS tablet Take 1 tablet (5 mg total) by mouth 2 (two) times daily. 180 tablet 3  ? Ascorbic Acid (VITAMIN C WITH ROSE HIPS) 1000 MG tablet Take 1,000 mg by mouth daily.    ? benzonatate (TESSALON) 200 MG capsule Take 1 capsule by mouth three times daily as needed for cough 60 capsule 0  ? Biotin 5 MG TBDP Take 5 mg by mouth every morning.    ? buPROPion (WELLBUTRIN XL) 150 MG 24 hr tablet Take 300 mg by mouth daily.    ? CALCIUM PO Take 1 tablet by mouth every morning.    ? Cholecalciferol (VITAMIN D) 2000 UNITS tablet Take 4,000 Units by mouth daily.    ? Coenzyme Q10-Vitamin E (QUNOL ULTRA COQ10 PO) Take 1 capsule by mouth every morning.    ? fexofenadine (ALLEGRA) 180 MG  tablet Take 180 mg by mouth daily as needed for allergies or rhinitis.    ? furosemide (LASIX) 20 MG tablet TAKE 1 TABLET EVERY DAY 90 tablet 3  ? levothyroxine (SYNTHROID) 75 MCG tablet TAKE 1 TABLET DAILY BEFORE BREAKFAST (NEED APPOINTMENT FOR FURTHER REFILLS) 90 tablet 0  ? Melatonin 10 MG TABS Take 10 mg by mouth at bedtime as needed (sleep).    ? metoprolol succinate (TOPROL-XL) 25 MG 24 hr tablet Take 1 tablet (25 mg total) by mouth daily. 45 tablet 3  ? potassium chloride SA (KLOR-CON M) 20 MEQ tablet TAKE 1 TABLET EVERY DAY 90 tablet 2  ? pravastatin (PRAVACHOL) 40 MG tablet Take 40 mg by mouth at bedtime.    ? Spacer/Aero-Holding Chambers (AEROCHAMBER MV) inhaler Use as instructed 1 each 0  ? ?No current facility-administered medications for this encounter.  ? ? ?Allergies  ?Allergen Reactions  ? Omnicef [Cefdinir] Swelling  ?  Tongue swelling  ? Bacitracin   ?  Other reaction(s): Rash  ? Bacitracin-Polymyxin B   ?  Other reaction(s): Rash  ? Duloxetine Hcl   ?  Other reaction(s): felt funny- thinking messed up  ? Lexapro [Escitalopram]   ?  Makes her feel funny and sleepy  ? Mirtazapine   ?  Other reaction(s): weight gain  ? Penicillins Other (See  Comments)  ?  Pt cannot remember reaction she had as hadn't been on it in about 20 years but stated that she knows she is allergic to it. ?Tongue swell  ? Peroxide [Hydrogen Peroxide] Other (See Comments)  ?  [place]

## 2021-05-23 ENCOUNTER — Telehealth: Payer: Self-pay | Admitting: Cardiology

## 2021-05-23 ENCOUNTER — Encounter: Payer: Self-pay | Admitting: Cardiology

## 2021-05-23 ENCOUNTER — Other Ambulatory Visit: Payer: Self-pay | Admitting: Cardiology

## 2021-05-23 DIAGNOSIS — I4819 Other persistent atrial fibrillation: Secondary | ICD-10-CM

## 2021-05-23 NOTE — Telephone Encounter (Signed)
Ms. Antony called to re-schedule CT scans with no answer. LVM for call back. Noted to have Cr at 1.56 with a GFR at 34 which appears to be at her baseline. Consider pre IV hydration.  ? ?Kathyrn Drown NP-C ?Structural Heart Team  ?Pager: [phone] ?Phone: [phone] ? ?

## 2021-05-26 ENCOUNTER — Telehealth (HOSPITAL_COMMUNITY): Payer: Self-pay | Admitting: *Deleted

## 2021-05-26 NOTE — Telephone Encounter (Signed)
Reaching out to patient to offer assistance regarding upcoming cardiac imaging study; pt verbalizes understanding of appt date/time, parking situation and where to check in, pre-test NPO status and verified current allergies; name and call back number provided for further questions should they arise ? ?Gordy Clement RN Navigator Cardiac Imaging ?[facility] ?[phone] office ?[phone] cell ? ?Patient states she is still currently on normal rhythm after her cardioversion.  She is aware to arrive at 10am. ?

## 2021-05-27 ENCOUNTER — Ambulatory Visit (HOSPITAL_COMMUNITY)
Admission: RE | Admit: 2021-05-27 | Discharge: 2021-05-27 | Disposition: A | Discharge status: Home / Self Care | Payer: Medicare HMO | Source: Ambulatory Visit | Attending: Cardiology | Admitting: Cardiology

## 2021-05-27 ENCOUNTER — Institutional Professional Consult (permissible substitution): Payer: Medicare HMO | Admitting: Cardiology

## 2021-05-27 DIAGNOSIS — D649 Anemia, unspecified: Secondary | ICD-10-CM | POA: Diagnosis not present

## 2021-05-27 DIAGNOSIS — I10 Essential (primary) hypertension: Secondary | ICD-10-CM | POA: Diagnosis not present

## 2021-05-27 DIAGNOSIS — D469 Myelodysplastic syndrome, unspecified: Secondary | ICD-10-CM | POA: Diagnosis not present

## 2021-05-27 DIAGNOSIS — I4819 Other persistent atrial fibrillation: Secondary | ICD-10-CM | POA: Diagnosis not present

## 2021-05-27 DIAGNOSIS — Z01818 Encounter for other preprocedural examination: Secondary | ICD-10-CM | POA: Insufficient documentation

## 2021-05-27 MED ORDER — IOHEXOL 350 MG/ML SOLN
80.0000 mL | Freq: Once | INTRAVENOUS | Status: AC | PRN
  Administered 2021-05-27: 80 mL via INTRAVENOUS

## 2021-05-29 ENCOUNTER — Encounter: Payer: Self-pay | Admitting: Cardiology

## 2021-06-06 ENCOUNTER — Other Ambulatory Visit: Payer: Self-pay

## 2021-06-06 DIAGNOSIS — I4891 Unspecified atrial fibrillation: Secondary | ICD-10-CM

## 2021-06-06 DIAGNOSIS — D649 Anemia, unspecified: Secondary | ICD-10-CM

## 2021-06-10 ENCOUNTER — Encounter: Payer: Self-pay | Admitting: Nurse Practitioner

## 2021-06-10 ENCOUNTER — Inpatient Hospital Stay: Payer: Medicare HMO | Admitting: Nurse Practitioner

## 2021-06-10 ENCOUNTER — Inpatient Hospital Stay: Payer: Medicare HMO | Attending: Oncology

## 2021-06-10 VITALS — BP 122/58 | HR 67 | Temp 98.2°F | Resp 18 | Ht 67.0 in | Wt 181.2 lb

## 2021-06-10 DIAGNOSIS — N189 Chronic kidney disease, unspecified: Secondary | ICD-10-CM | POA: Diagnosis not present

## 2021-06-10 DIAGNOSIS — R61 Generalized hyperhidrosis: Secondary | ICD-10-CM | POA: Insufficient documentation

## 2021-06-10 DIAGNOSIS — D539 Nutritional anemia, unspecified: Secondary | ICD-10-CM | POA: Insufficient documentation

## 2021-06-10 DIAGNOSIS — R634 Abnormal weight loss: Secondary | ICD-10-CM | POA: Insufficient documentation

## 2021-06-10 DIAGNOSIS — R0609 Other forms of dyspnea: Secondary | ICD-10-CM | POA: Insufficient documentation

## 2021-06-10 DIAGNOSIS — Z79899 Other long term (current) drug therapy: Secondary | ICD-10-CM | POA: Insufficient documentation

## 2021-06-10 DIAGNOSIS — G4730 Sleep apnea, unspecified: Secondary | ICD-10-CM | POA: Diagnosis not present

## 2021-06-10 DIAGNOSIS — R630 Anorexia: Secondary | ICD-10-CM | POA: Insufficient documentation

## 2021-06-10 DIAGNOSIS — D649 Anemia, unspecified: Secondary | ICD-10-CM

## 2021-06-10 DIAGNOSIS — D61818 Other pancytopenia: Secondary | ICD-10-CM | POA: Insufficient documentation

## 2021-06-10 DIAGNOSIS — R918 Other nonspecific abnormal finding of lung field: Secondary | ICD-10-CM | POA: Diagnosis not present

## 2021-06-10 DIAGNOSIS — I129 Hypertensive chronic kidney disease with stage 1 through stage 4 chronic kidney disease, or unspecified chronic kidney disease: Secondary | ICD-10-CM | POA: Diagnosis not present

## 2021-06-10 DIAGNOSIS — R2681 Unsteadiness on feet: Secondary | ICD-10-CM | POA: Diagnosis not present

## 2021-06-10 DIAGNOSIS — I2510 Atherosclerotic heart disease of native coronary artery without angina pectoris: Secondary | ICD-10-CM | POA: Diagnosis not present

## 2021-06-10 DIAGNOSIS — I4891 Unspecified atrial fibrillation: Secondary | ICD-10-CM | POA: Insufficient documentation

## 2021-06-10 DIAGNOSIS — R161 Splenomegaly, not elsewhere classified: Secondary | ICD-10-CM | POA: Insufficient documentation

## 2021-06-10 DIAGNOSIS — N1832 Chronic kidney disease, stage 3b: Secondary | ICD-10-CM | POA: Diagnosis not present

## 2021-06-10 LAB — CBC WITH DIFFERENTIAL (CANCER CENTER ONLY)
Abs Immature Granulocytes: 0.01 10*3/uL (ref 0.00–0.07)
Basophils Absolute: 0 10*3/uL (ref 0.0–0.1)
Basophils Relative: 1 %
Eosinophils Absolute: 0.1 10*3/uL (ref 0.0–0.5)
Eosinophils Relative: 2 %
HCT: 36.1 % (ref 36.0–46.0)
Hemoglobin: 11.2 g/dL — ABNORMAL LOW (ref 12.0–15.0)
Immature Granulocytes: 0 %
Lymphocytes Relative: 35 %
Lymphs Abs: 1.1 10*3/uL (ref 0.7–4.0)
MCH: 29.9 pg (ref 26.0–34.0)
MCHC: 31 g/dL (ref 30.0–36.0)
MCV: 96.3 fL (ref 80.0–100.0)
Monocytes Absolute: 0.7 10*3/uL (ref 0.1–1.0)
Monocytes Relative: 22 %
Neutro Abs: 1.2 10*3/uL — ABNORMAL LOW (ref 1.7–7.7)
Neutrophils Relative %: 40 %
Platelet Count: 97 10*3/uL — ABNORMAL LOW (ref 150–400)
RBC: 3.75 MIL/uL — ABNORMAL LOW (ref 3.87–5.11)
RDW: 17.2 % — ABNORMAL HIGH (ref 11.5–15.5)
WBC Count: 3 10*3/uL — ABNORMAL LOW (ref 4.0–10.5)
nRBC: 0 % (ref 0.0–0.2)

## 2021-06-10 LAB — LACTATE DEHYDROGENASE: LDH: 1166 U/L — ABNORMAL HIGH (ref 98–192)

## 2021-06-10 NOTE — Progress Notes (Addendum)
  [facility] OFFICE PROGRESS NOTE   Diagnosis: Anemia  INTERVAL HISTORY:   [name] [name] returns as scheduled.  Energy level varies.  No fever.  Periodic night sweats.  Poor appetite.  No recent infections.  No bleeding.  She reports a history of anxiety/depression and wonders if this may be contributing to the weight loss.  Objective:  Vital signs in last 24 hours:  Blood pressure (!) 122/58, pulse 67, temperature 98.2 F (36.8 C), temperature source Oral, resp. rate 18, height $RemoveBe'5\' 7"'nZmtHXANN$  (1.702 m), weight 181 lb 3.2 oz (82.2 kg), SpO2 98 %.    HEENT: No thrush or ulcers. Lymphatics: No palpable cervical, supraclavicular or axillary lymph nodes. Resp: Lungs clear bilaterally. Cardio: Regular rate and rhythm. GI: No hepatomegaly.  Tender left upper abdomen, question palpable spleen tip. Vascular: No leg edema.  Lab Results:  Lab Results  Component Value Date   WBC 3.0 (L) 06/10/2021   HGB 11.2 (L) 06/10/2021   HCT 36.1 06/10/2021   MCV 96.3 06/10/2021   PLT 97 (L) 06/10/2021   NEUTROABS 1.2 (L) 06/10/2021    Imaging:  No results found.  Medications: I have reviewed the patient's current medications.  Assessment/Plan: Macrocytic anemia/thrombocytopenia 08/13/2020 B12 3500 08/13/2020 LDH 612 08/13/2020 myeloma panel-no monoclonal protein  Bone marrow biopsy 08/20/2020-hypercellular marrow with erythroid hyperplasia and dyspoiesis, rare lipogranuloma like lesions.  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  Negative myeloma FISH panel, 46XX [facility] screen - 08/26/2020 CTs 02/17/2021-new splenomegaly; diffuse bilateral bronchial wall thickening; clustered groundglass and fine nodular opacity in the dependent right lower lobe.  CAD. Fatigue Dyspnea on exertion Unsteady gait/balance disorder, followed by Dr. Tomi Likens CKD Atrial fibrillation Hypertension Sleep apnea Anorexia/weight loss  Disposition: [name] [name] remains stable from a hematologic  standpoint.  She has persistent mild pancytopenia, likely due to a myelodysplastic/myeloproliferative disorder.  Plan to continue to follow with observation.  Refer for repeat bone marrow biopsy if blood counts decline.  Lab and follow-up in 6 weeks.  She will contact the office in the interim with any problems.  She plans to contact Dr. Dema Severin for an appointment to discuss depression.    Ned Card ANP/GNP-BC   06/10/2021  9:40 AM

## 2021-06-12 ENCOUNTER — Telehealth: Payer: Self-pay

## 2021-06-12 DIAGNOSIS — L958 Other vasculitis limited to the skin: Secondary | ICD-10-CM | POA: Diagnosis not present

## 2021-06-12 NOTE — Telephone Encounter (Signed)
Patient called in and stated she have a rash on her chest. I spoke with the provider they agree to bring her in on Friday. She also stated the rash was not itching just red. No fever or pain. Patient gave verbal understanding and had not further questions.

## 2021-06-13 ENCOUNTER — Other Ambulatory Visit: Payer: Medicare HMO | Admitting: *Deleted

## 2021-06-13 ENCOUNTER — Encounter: Payer: Self-pay | Admitting: Nurse Practitioner

## 2021-06-13 ENCOUNTER — Inpatient Hospital Stay: Payer: Medicare HMO

## 2021-06-13 ENCOUNTER — Inpatient Hospital Stay: Payer: Medicare HMO | Attending: Oncology | Admitting: Nurse Practitioner

## 2021-06-13 VITALS — BP 117/58 | HR 67 | Temp 98.1°F | Resp 18 | Ht 67.0 in | Wt 182.0 lb

## 2021-06-13 DIAGNOSIS — Z01818 Encounter for other preprocedural examination: Secondary | ICD-10-CM | POA: Diagnosis not present

## 2021-06-13 DIAGNOSIS — D649 Anemia, unspecified: Secondary | ICD-10-CM | POA: Diagnosis not present

## 2021-06-13 DIAGNOSIS — D469 Myelodysplastic syndrome, unspecified: Secondary | ICD-10-CM | POA: Diagnosis not present

## 2021-06-13 DIAGNOSIS — I10 Essential (primary) hypertension: Secondary | ICD-10-CM

## 2021-06-13 DIAGNOSIS — I2510 Atherosclerotic heart disease of native coronary artery without angina pectoris: Secondary | ICD-10-CM | POA: Insufficient documentation

## 2021-06-13 DIAGNOSIS — I129 Hypertensive chronic kidney disease with stage 1 through stage 4 chronic kidney disease, or unspecified chronic kidney disease: Secondary | ICD-10-CM | POA: Diagnosis not present

## 2021-06-13 DIAGNOSIS — R630 Anorexia: Secondary | ICD-10-CM | POA: Insufficient documentation

## 2021-06-13 DIAGNOSIS — D539 Nutritional anemia, unspecified: Secondary | ICD-10-CM | POA: Insufficient documentation

## 2021-06-13 DIAGNOSIS — R634 Abnormal weight loss: Secondary | ICD-10-CM | POA: Insufficient documentation

## 2021-06-13 DIAGNOSIS — Z79899 Other long term (current) drug therapy: Secondary | ICD-10-CM | POA: Insufficient documentation

## 2021-06-13 DIAGNOSIS — R0609 Other forms of dyspnea: Secondary | ICD-10-CM | POA: Diagnosis not present

## 2021-06-13 DIAGNOSIS — G4730 Sleep apnea, unspecified: Secondary | ICD-10-CM | POA: Insufficient documentation

## 2021-06-13 DIAGNOSIS — R2681 Unsteadiness on feet: Secondary | ICD-10-CM | POA: Insufficient documentation

## 2021-06-13 DIAGNOSIS — R21 Rash and other nonspecific skin eruption: Secondary | ICD-10-CM | POA: Insufficient documentation

## 2021-06-13 DIAGNOSIS — N189 Chronic kidney disease, unspecified: Secondary | ICD-10-CM | POA: Diagnosis not present

## 2021-06-13 DIAGNOSIS — I4819 Other persistent atrial fibrillation: Secondary | ICD-10-CM | POA: Diagnosis not present

## 2021-06-13 DIAGNOSIS — I4891 Unspecified atrial fibrillation: Secondary | ICD-10-CM | POA: Insufficient documentation

## 2021-06-13 DIAGNOSIS — F419 Anxiety disorder, unspecified: Secondary | ICD-10-CM | POA: Diagnosis not present

## 2021-06-13 DIAGNOSIS — F331 Major depressive disorder, recurrent, moderate: Secondary | ICD-10-CM | POA: Diagnosis not present

## 2021-06-13 DIAGNOSIS — K219 Gastro-esophageal reflux disease without esophagitis: Secondary | ICD-10-CM | POA: Diagnosis not present

## 2021-06-13 DIAGNOSIS — I776 Arteritis, unspecified: Secondary | ICD-10-CM | POA: Diagnosis not present

## 2021-06-13 LAB — COMPREHENSIVE METABOLIC PANEL
ALT: 12 IU/L (ref 0–32)
AST: 28 IU/L (ref 0–40)
Albumin/Globulin Ratio: 1.2 (ref 1.2–2.2)
Albumin: 3.6 g/dL — ABNORMAL LOW (ref 3.7–4.7)
Alkaline Phosphatase: 75 IU/L (ref 44–121)
BUN/Creatinine Ratio: 11 — ABNORMAL LOW (ref 12–28)
BUN: 19 mg/dL (ref 8–27)
Bilirubin Total: 0.6 mg/dL (ref 0.0–1.2)
CO2: 27 mmol/L (ref 20–29)
Calcium: 9.4 mg/dL (ref 8.7–10.3)
Chloride: 103 mmol/L (ref 96–106)
Creatinine, Ser: 1.72 mg/dL — ABNORMAL HIGH (ref 0.57–1.00)
Globulin, Total: 3 g/dL (ref 1.5–4.5)
Glucose: 97 mg/dL (ref 70–99)
Potassium: 4.8 mmol/L (ref 3.5–5.2)
Sodium: 139 mmol/L (ref 134–144)
Total Protein: 6.6 g/dL (ref 6.0–8.5)
eGFR: 31 mL/min/{1.73_m2} — ABNORMAL LOW (ref >59)

## 2021-06-13 LAB — CBC WITH DIFFERENTIAL (CANCER CENTER ONLY)
Abs Immature Granulocytes: 0 10*3/uL (ref 0.00–0.07)
Basophils Absolute: 0 10*3/uL (ref 0.0–0.1)
Basophils Relative: 1 %
Eosinophils Absolute: 0.1 10*3/uL (ref 0.0–0.5)
Eosinophils Relative: 4 %
HCT: 34.3 % — ABNORMAL LOW (ref 36.0–46.0)
Hemoglobin: 10.7 g/dL — ABNORMAL LOW (ref 12.0–15.0)
Immature Granulocytes: 0 %
Lymphocytes Relative: 34 %
Lymphs Abs: 1.1 10*3/uL (ref 0.7–4.0)
MCH: 30.1 pg (ref 26.0–34.0)
MCHC: 31.2 g/dL (ref 30.0–36.0)
MCV: 96.3 fL (ref 80.0–100.0)
Monocytes Absolute: 0.8 10*3/uL (ref 0.1–1.0)
Monocytes Relative: 26 %
Neutro Abs: 1.1 10*3/uL — ABNORMAL LOW (ref 1.7–7.7)
Neutrophils Relative %: 35 %
Platelet Count: 93 10*3/uL — ABNORMAL LOW (ref 150–400)
RBC: 3.56 MIL/uL — ABNORMAL LOW (ref 3.87–5.11)
RDW: 17.2 % — ABNORMAL HIGH (ref 11.5–15.5)
WBC Count: 3.1 10*3/uL — ABNORMAL LOW (ref 4.0–10.5)
nRBC: 0 % (ref 0.0–0.2)

## 2021-06-13 LAB — TSH: TSH: 4.93 u[IU]/mL — ABNORMAL HIGH (ref 0.450–4.500)

## 2021-06-13 LAB — T4, FREE: Free T4: 1.67 ng/dL (ref 0.82–1.77)

## 2021-06-13 MED ORDER — PREDNISONE 10 MG PO TABS: ORAL_TABLET | ORAL | 1 refills | Status: DC

## 2021-06-13 NOTE — Progress Notes (Signed)
  [name] OFFICE PROGRESS NOTE   Diagnosis:  Anemia  INTERVAL HISTORY:   [name] [name] returns prior to scheduled follow-up for evaluation of a new rash.  She was seen on 06/10/2021.  She woke up on 06/11/2021 with a rash on the trunk.  She is now noticing the rash on her extremities.  No pruritus.  No fever.  No new medications.  She denies bleeding.  Objective:  Vital signs in last 24 hours:  Blood pressure (!) 117/58, pulse 67, temperature 98.1 F (36.7 C), temperature source Oral, resp. rate 18, height $RemoveBe'5\' 7"'bRQDWGdod$  (1.702 m), weight 182 lb (82.6 kg), SpO2 98 %.    HEENT: No thrush or ulcers. Resp: Lungs clear bilaterally. Cardio: Regular rate and rhythm. GI: Spleen palpable left upper abdomen.  No hepatomegaly. Vascular: No leg edema. Neuro: Alert and oriented. Skin: Petechial/purpuric appearing rash on the trunk, less so on the extremities.  Palms are spared.   Lab Results:  Lab Results  Component Value Date   WBC 3.0 (L) 06/10/2021   HGB 11.2 (L) 06/10/2021   HCT 36.1 06/10/2021   MCV 96.3 06/10/2021   PLT 97 (L) 06/10/2021   NEUTROABS 1.2 (L) 06/10/2021    Imaging:  No results found.  Medications: I have reviewed the patient's current medications.  Assessment/Plan: Macrocytic anemia/thrombocytopenia 08/13/2020 B12 3500 08/13/2020 LDH 612 08/13/2020 myeloma panel-no monoclonal protein  Bone marrow biopsy 08/20/2020-hypercellular marrow with erythroid hyperplasia and dyspoiesis, rare lipogranuloma like lesions.  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  Negative myeloma FISH panel, 46XX; neotype myeloid disorders profile NGS sequencing-no pathogenic mutations detected in any of the genes on the NGS panel [facility] screen - 08/26/2020 CTs 02/17/2021-new splenomegaly; diffuse bilateral bronchial wall thickening; clustered groundglass and fine nodular opacity in the dependent right lower lobe.  CAD. Fatigue Dyspnea on exertion Unsteady gait/balance  disorder, followed by Dr. Tomi Likens CKD Atrial fibrillation Hypertension Sleep apnea Anorexia/weight loss 06/13/2021 skin rash, question vasculitis-course of prednisone initiated    Disposition: [name] [name] appears stable.  We saw her a few days ago.  She subsequently developed a generalized skin rash.  The rash has a vasculitic appearance.  She will begin a course of prednisone.  We reviewed potential side effects associated with steroids.  She agrees to proceed.  She will return for a follow-up visit 06/19/2021.  We are available to see her sooner if needed.  Patient seen with Dr. Benay Spice.    Ned Card ANP/GNP-BC   06/13/2021  1:54 PM This was a shared visit with Ned Card.  [name] [name] was interviewed and examined.  She developed a diffuse rash over the past few days.  The rash is erythematous and maculopapular.  The rash is purpuric in areas over the lower extremities.  No vesicles.  The etiology of the rash is unclear, but vasculitis and an allergic reaction are possible.  She could have a rash in association with the underlying hematologic condition. She will begin a trial of prednisone.  She will call if the rash does not improve on prednisone.  I was present for greater than 50% today's visit.  I performed medical decision making.  [name] Manson, MD

## 2021-06-19 ENCOUNTER — Other Ambulatory Visit: Payer: Self-pay

## 2021-06-19 ENCOUNTER — Encounter: Payer: Self-pay | Admitting: Cardiology

## 2021-06-19 ENCOUNTER — Ambulatory Visit: Payer: Medicare HMO | Admitting: Nurse Practitioner

## 2021-06-19 DIAGNOSIS — N2581 Secondary hyperparathyroidism of renal origin: Secondary | ICD-10-CM | POA: Diagnosis not present

## 2021-06-19 DIAGNOSIS — D631 Anemia in chronic kidney disease: Secondary | ICD-10-CM | POA: Diagnosis not present

## 2021-06-19 DIAGNOSIS — N1832 Chronic kidney disease, stage 3b: Secondary | ICD-10-CM | POA: Diagnosis not present

## 2021-06-19 DIAGNOSIS — E032 Hypothyroidism due to medicaments and other exogenous substances: Secondary | ICD-10-CM

## 2021-06-19 DIAGNOSIS — I129 Hypertensive chronic kidney disease with stage 1 through stage 4 chronic kidney disease, or unspecified chronic kidney disease: Secondary | ICD-10-CM | POA: Diagnosis not present

## 2021-06-19 MED ORDER — LEVOTHYROXINE SODIUM 75 MCG PO TABS: ORAL_TABLET | ORAL | 0 refills | Status: DC

## 2021-06-20 ENCOUNTER — Encounter: Payer: Self-pay | Admitting: Nurse Practitioner

## 2021-06-20 ENCOUNTER — Other Ambulatory Visit: Payer: Self-pay

## 2021-06-20 ENCOUNTER — Inpatient Hospital Stay: Payer: Medicare HMO | Admitting: Nurse Practitioner

## 2021-06-20 VITALS — BP 131/60 | HR 60 | Temp 98.2°F | Resp 18 | Ht 67.0 in | Wt 186.2 lb

## 2021-06-20 DIAGNOSIS — I4891 Unspecified atrial fibrillation: Secondary | ICD-10-CM | POA: Diagnosis not present

## 2021-06-20 DIAGNOSIS — D649 Anemia, unspecified: Secondary | ICD-10-CM

## 2021-06-20 DIAGNOSIS — R0609 Other forms of dyspnea: Secondary | ICD-10-CM | POA: Diagnosis not present

## 2021-06-20 DIAGNOSIS — N189 Chronic kidney disease, unspecified: Secondary | ICD-10-CM | POA: Diagnosis not present

## 2021-06-20 DIAGNOSIS — I129 Hypertensive chronic kidney disease with stage 1 through stage 4 chronic kidney disease, or unspecified chronic kidney disease: Secondary | ICD-10-CM | POA: Diagnosis not present

## 2021-06-20 DIAGNOSIS — R21 Rash and other nonspecific skin eruption: Secondary | ICD-10-CM | POA: Diagnosis not present

## 2021-06-20 DIAGNOSIS — D539 Nutritional anemia, unspecified: Secondary | ICD-10-CM | POA: Diagnosis not present

## 2021-06-20 DIAGNOSIS — I2510 Atherosclerotic heart disease of native coronary artery without angina pectoris: Secondary | ICD-10-CM | POA: Diagnosis not present

## 2021-06-20 DIAGNOSIS — G4730 Sleep apnea, unspecified: Secondary | ICD-10-CM | POA: Diagnosis not present

## 2021-06-20 DIAGNOSIS — R2681 Unsteadiness on feet: Secondary | ICD-10-CM | POA: Diagnosis not present

## 2021-06-20 MED ORDER — METOPROLOL SUCCINATE ER 25 MG PO TB24: 25.0000 mg | ORAL_TABLET | Freq: Every day | ORAL | 3 refills | Status: DC

## 2021-06-20 NOTE — Progress Notes (Signed)
  [facility] OFFICE PROGRESS NOTE   Diagnosis: Anemia  INTERVAL HISTORY:   Ms. Consalvo returns as scheduled.  She began a course of prednisone for a rash on 06/13/2021.  She reports significant improvement.  She continues to note a faint rash on the breast.  Appetite and energy are good.  No fever.  Recent sore throat.  Objective:  Vital signs in last 24 hours:  Blood pressure 131/60, pulse 60, temperature 98.2 F (36.8 C), temperature source Oral, resp. rate 18, height 5' 7" (1.702 m), weight 186 lb 3.2 oz (84.5 kg), SpO2 98 %.    HEENT: No thrush or ulcers. Resp: Lungs clear bilaterally. Cardio: Regular rate and rhythm. GI: Abdomen soft and nontender.  Spleen not definitely palpable on exam today. Vascular: No leg edema. Skin: Faint erythematous rash on the back, breasts and upper legs   Lab Results:  Lab Results  Component Value Date   WBC 3.1 (L) 06/13/2021   HGB 10.7 (L) 06/13/2021   HCT 34.3 (L) 06/13/2021   MCV 96.3 06/13/2021   PLT 93 (L) 06/13/2021   NEUTROABS 1.1 (L) 06/13/2021    Imaging:  No results found.  Medications: I have reviewed the patient's current medications.  Assessment/Plan: Macrocytic anemia/thrombocytopenia 08/13/2020 B12 3500 08/13/2020 LDH 612 08/13/2020 myeloma panel-no monoclonal protein  Bone marrow biopsy 08/20/2020-hypercellular marrow with erythroid hyperplasia and dyspoiesis, rare lipogranuloma like lesions.  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  Negative myeloma FISH panel, 46XX; neotype myeloid disorders profile NGS sequencing-no pathogenic mutations detected in any of the genes on the NGS panel [facility] screen - 08/26/2020 CTs 02/17/2021-new splenomegaly; diffuse bilateral bronchial wall thickening; clustered groundglass and fine nodular opacity in the dependent right lower lobe.  CAD. Fatigue Dyspnea on exertion Unsteady gait/balance disorder, followed by Dr. Tomi Likens CKD Atrial  fibrillation Hypertension Sleep apnea Anorexia/weight loss 06/13/2021 skin rash, question vasculitis-course of prednisone initiated; 06/20/2021 marked improvement  Disposition: Ms. Vogelgesang appears stable.  The skin rash has improved significantly.  She will continue prednisone taper (20 mg daily for 4 days, 10 mg daily for 4 days, then stop).  She understands to contact the office if the rash recurs.  Otherwise she will return as scheduled 07/22/2021.    Ned Card ANP/GNP-BC   06/20/2021  8:39 AM

## 2021-07-03 ENCOUNTER — Telehealth: Payer: Self-pay

## 2021-07-03 NOTE — Telephone Encounter (Signed)
Called to offer sooner Watchman implant on 07/17/2021.  Left message to call back.

## 2021-07-04 ENCOUNTER — Other Ambulatory Visit: Payer: Self-pay

## 2021-07-04 DIAGNOSIS — D649 Anemia, unspecified: Secondary | ICD-10-CM

## 2021-07-04 DIAGNOSIS — I4891 Unspecified atrial fibrillation: Secondary | ICD-10-CM

## 2021-07-07 NOTE — Progress Notes (Signed)
[facility]                                     Cardiology Office Note:    Date:  07/09/2021   ID:  [name] [name], DOB February 27, 1945, MRN [id number]  PCP:  Harlan Stains, MD  [place] Cardiologist:  Candee Furbish, MD  [facility] Electrophysiologist:  Vickie Epley, MD   Referring MD: Harlan Stains, MD   Discuss upcoming [place] with Watchman  History of Present Illness:    [name] [name] is a 76 y.o. female with a hx of paroxysmal atrial fibrillation on Eliquis, CKD stage 3, hypertension, hyperlipidemia, sleep apnea not on CPAP, GERD, anemia, myelodysplastic syndrome, and arthritis who presents to clinic for follow up.   She followed up with Laurann Montana, NP 04/17/2021. 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   She presented to the ED 04/07/2021 complaining of palpitations. Her heart rate was greater than 150 bpm, not relieved with home metoprolol. She was found to be in Afib with RVR, anticoagulated on DOAC. Cardioversion was performed and she reverted to sinus rhythm. She did have 2 cardioversions prior, one on 03/22/2021, another 4+ years ago.  She was seen by Dr. Quentin Ore in the office on 04/18/21 for consideration of Watchman given anemia and MDS. She was started on amiodarone and set up for pre Watchman CT. This showed moderate LA dilation, severe RA dilation,LAA with a large mixed chicken wing / broccoli type with two lobes suitable for LAAO with Watchman, as well as a coronary calcium score of 305 (79th percentile for age, sex, and race matched control) and dilation of the pulmonary artery.   Today the patient presents to clinic for follow up. She is here alone. No CP or SOB. No LE edema, orthopnea or PND. No dizziness or syncope. No blood in stool or urine. No palpitations. About a week ago she developed a painless, diffuse rash all  over her body. She saw the dermatologist who felt like it was a possible vasculitis and questioned a dental infection. He treated her with abx and steroids.    Past Medical History:  Diagnosis Date   A-fib ([place])    paroxysmal   Anxiety    Arthritis    Cataracts, bilateral    immature   CKD (chronic kidney disease) stage 3, GFR 30-59 ml/min (HCC)    Depression    GERD (gastroesophageal reflux disease)    was on Nexium    GERD (gastroesophageal reflux disease)    Hemorrhoid    History of bronchitis    couple of yrs ago   History of colon polyps    History of peristent atrial fibrillation    Hyperlipidemia    takes Pravastatin daily   Hypertension    takes Amlodipine and Diovan daily   Hypertension    Pre-diabetes    Sleep apnea    no cpap used   Sleep apnea    study done 20+yrs ago and doesn't use cpap   Urinary urgency    Weakness    in left leg r/t back    Past Surgical History:  Procedure Laterality Date   ABDOMINAL HYSTERECTOMY  1980   partial   ABDOMINAL HYSTERECTOMY     partial   BACK SURGERY  CARDIOVERSION N/A 02/24/2016   Procedure: CARDIOVERSION;  Surgeon: Fay Records, MD;  Location: [facility] ENDOSCOPY;  Service: Cardiovascular;  Laterality: N/A;   CARDIOVERSION N/A 03/19/2016   Procedure: CARDIOVERSION;  Surgeon: Fay Records, MD;  Location: [place];  Service: Cardiovascular;  Laterality: N/A;   CARDIOVERSION N/A 05/15/2021   Procedure: CARDIOVERSION;  Surgeon: Pixie Casino, MD;  Location: [facility] ENDOSCOPY;  Service: Cardiovascular;  Laterality: N/A;   [place]     COLONOSCOPY     COLONOSCOPY WITH PROPOFOL  12/29/2011   Procedure: COLONOSCOPY WITH PROPOFOL;  Surgeon: Lear Ng, MD;  Location: [facility] ENDOSCOPY;  Service: Endoscopy;  Laterality: N/A;   COLONOSCOPY WITH PROPOFOL N/A 05/24/2014   Procedure: COLONOSCOPY WITH PROPOFOL;  Surgeon: Wilford Corner, MD;  Location: [facility] ENDOSCOPY;   Service: Endoscopy;  Laterality: N/A;   ESOPHAGEAL MANOMETRY N/A 02/22/2017   Procedure: ESOPHAGEAL MANOMETRY (EM);  Surgeon: Wilford Corner, MD;  Location: [facility] ENDOSCOPY;  Service: Endoscopy;  Laterality: N/A;   ESOPHAGOGASTRODUODENOSCOPY     FOOT SURGERY     left, right   FRACTURE SURGERY     left leg-knee   HEEL SPUR EXCISION Bilateral    HOT HEMOSTASIS  12/29/2011   Procedure: HOT HEMOSTASIS (ARGON PLASMA COAGULATION/BICAP);  Surgeon: Lear Ng, MD;  Location: [facility] ENDOSCOPY;  Service: Endoscopy;  Laterality: N/A;   HOT HEMOSTASIS N/A 05/24/2014   Procedure: HOT HEMOSTASIS (ARGON PLASMA COAGULATION/BICAP);  Surgeon: Wilford Corner, MD;  Location: [facility] ENDOSCOPY;  Service: Endoscopy;  Laterality: N/A;   leg surgery d/t break Left    LUMBAR LAMINECTOMY/DECOMPRESSION MICRODISCECTOMY Left 04/05/2013   Procedure: LUMBAR FOUR TO FIVE LUMBAR LAMINECTOMY/DECOMPRESSION MICRODISCECTOMY 1 LEVEL;  Surgeon: Eustace Moore, MD;  Location: [place] NEURO ORS;  Service: Neurosurgery;  Laterality: Left;   NISSEN FUNDOPLICATION     [place] IMPEDANCE STUDY N/A 02/22/2017   Procedure: [place] IMPEDANCE STUDY;  Surgeon: Wilford Corner, MD;  Location: [facility] ENDOSCOPY;  Service: Endoscopy;  Laterality: N/A;   TEE WITHOUT CARDIOVERSION N/A 02/24/2016   Procedure: TRANSESOPHAGEAL ECHOCARDIOGRAM (TEE);  Surgeon: Fay Records, MD;  Location: [place];  Service: Cardiovascular;  Laterality: N/A;   TOTAL KNEE ARTHROPLASTY Left 10/07/2016   TOTAL KNEE ARTHROPLASTY Left 10/07/2016   Procedure: TOTAL KNEE ARTHROPLASTY;  Surgeon: [name], MD;  Location: [place];  Service: Orthopedics;  Laterality: Left;    Current Medications: Current Meds  Medication Sig   acetaminophen (TYLENOL) 500 MG tablet Take 1,000 mg by mouth every 6 (six) hours as needed for moderate pain.   albuterol (VENTOLIN HFA) 108 (90 Base) MCG/ACT inhaler Inhale 1-2 puffs into the lungs every 4 (four) hours as needed for wheezing or shortness of  breath.   ALPRAZolam (XANAX) 0.5 MG tablet Take 1 tablet (0.5 mg total) by mouth 3 (three) times daily as needed for anxiety.   amiodarone (PACERONE) 200 MG tablet Taking one tablet by mouth daily   apixaban (ELIQUIS) 5 MG TABS tablet Take 1 tablet (5 mg total) by mouth 2 (two) times daily.   Ascorbic Acid (VITAMIN C WITH ROSE HIPS) 1000 MG tablet Take 1,000 mg by mouth daily.   benzonatate (TESSALON) 200 MG capsule Take 1 capsule by mouth three times daily as needed for cough   Biotin 5 MG TBDP Take 5 mg by mouth every morning.   buPROPion (WELLBUTRIN XL) 300 MG 24 hr tablet Take 300 mg by mouth daily.   Calcium Carb-Cholecalciferol (  CALCIUM 500 + D PO) Take 1 tablet by mouth daily.   Coenzyme Q10 (CO Q-10) 100 MG CAPS Take 100 mg by mouth daily.   Cyanocobalamin (B-12) 2000 MCG TABS Take 2,000 mcg by mouth daily.   doxycycline (VIBRAMYCIN) 100 MG capsule Take 100 mg by mouth daily.   famotidine (PEPCID) 20 MG tablet Take 20 mg by mouth daily as needed for heartburn or indigestion.   fexofenadine (ALLEGRA) 180 MG tablet Take 180 mg by mouth daily.   furosemide (LASIX) 20 MG tablet TAKE 1 TABLET EVERY DAY   levothyroxine (SYNTHROID) 75 MCG tablet TAKE 1 TABLET DAILY BEFORE BREAKFAST (NEED APPOINTMENT FOR FURTHER REFILLS)   Magnesium 250 MG TABS Take 250 mg by mouth daily.   metoprolol succinate (TOPROL-XL) 25 MG 24 hr tablet Take 1 tablet (25 mg total) by mouth daily.   mirtazapine (REMERON) 30 MG tablet Take 1 tablet by mouth at bedtime.   potassium chloride SA (KLOR-CON M) 20 MEQ tablet TAKE 1 TABLET EVERY DAY   pravastatin (PRAVACHOL) 40 MG tablet Take 40 mg by mouth at bedtime.   predniSONE (DELTASONE) 5 MG tablet Take 5-35 mg by mouth See admin instructions. Take 35 mg for 1 day then reduce by 5 mg daily until finished   sodium chloride (OCEAN) 0.65 % SOLN nasal spray Place 1 spray into both nostrils as needed for congestion.   Spacer/Aero-Holding Chambers (AEROCHAMBER MV) inhaler Use as  instructed     Allergies:   Omnicef [cefdinir], Bacitracin, Bacitracin-polymyxin b, Duloxetine hcl, Lexapro [escitalopram], Mirtazapine, Penicillins, Peroxide [hydrogen peroxide], Zoloft [sertraline hcl], and Neosporin [neomycin-bacitracin zn-polymyx]   Social History   Socioeconomic History   Marital status: Married    Spouse name: Dominica Severin   Number of children: 2   Years of education: 12   Highest education level: Some college, no degree  Occupational History   Occupation: retired  Tobacco Use   Smoking status: Never   Smokeless tobacco: Never   Tobacco comments:    Never smoke 05/22/21  Vaping Use   Vaping Use: Never used  Substance and Sexual Activity   Alcohol use: No   Drug use: No   Sexual activity: Yes    Birth control/protection: Surgical  Other Topics Concern   Not on file  Social History Narrative   Right handed   Drinks caffeine   Two story home   Social Determinants of Health   Financial Resource Strain: Low Risk  (11/25/2016)   Overall Financial Resource Strain (CARDIA)    Difficulty of Paying Living Expenses: Not very hard  Food Insecurity: Unknown (11/25/2016)   Hunger Vital Sign    Worried About [place] in the Last Year: Patient refused    [place] in the Last Year: Patient refused  Transportation Needs: Unknown (11/25/2016)   [place] - Transportation    Lack of Transportation (Medical): Patient refused    Lack of Transportation (Non-Medical): Patient refused  Physical Activity: Unknown (11/25/2016)   Exercise Vital Sign    Days of Exercise per Week: Patient refused    Minutes of Exercise per Session: Patient refused  Stress: Stress Concern Present (11/25/2016)   [place]    Feeling of Stress : Very much  Social Connections: Moderately Integrated (11/25/2016)   Social Connection and Isolation Panel [NHANES]    Frequency of Communication with Friends and Family:  Twice a week    Frequency of Social Gatherings with Friends and  Family: Twice a week    Attends Religious Services: More than 4 times per year    Active Member of Clubs or Organizations: No    Attends Archivist Meetings: Never    Marital Status: Married     Family History: The patient's family history includes Colon cancer in her mother; Heart disease in her father. There is no history of Breast cancer or Thyroid disease.  ROS:   Please see the history of present illness.    All other systems reviewed and are negative.  EKGs/Labs/Other Studies Reviewed:    The following studies were reviewed today:  Cardiac CT 05/27/21 IMPRESSION: 1. There is normal pulmonary vein drainage into the left atrium. Moderate left atrial dilation. Severe right atrial dilation.   2. The left atrial appendage is large - mixed chicken wing / broccoli type with two lobes. There is a perfusion defect that resolved in the delay imaging. This is not suggestive of thrombus in the left atrial appendage. Measurements for LAA-O device as above.   3. The esophagus runs in the left atrial midline but is in closer proximity to the right lower pulmonary vein ostium.   4. Coronary calcium score of 305. This was 79th percentile for age, sex, and race matched control.   5. Dilation of the main pulmonary artery 30 mm with trunk to aorta ratio of ? 0.9. This can be associated with the presence of pulmonary hypertension; clinical correlation advised.   6. Moderate mitral annular calcification.   7. IVC dilation: 31 mm   RECOMMENDATIONS:   Coronary artery calcium (CAC) score is a strong predictor of incident coronary heart disease (CHD) and provides predictive information beyond traditional risk factors. CAC scoring is reasonable to use in the decision to withhold, postpone, or initiate statin therapy in intermediate-risk or selected borderline-risk asymptomatic adults (age 4-75 years and LDL-C >=70  to <190 mg/dL) who do not have diabetes or established atherosclerotic cardiovascular disease (ASCVD).* In intermediate-risk (10-year ASCVD risk >=7.5% to <20%) adults or selected borderline-risk (10-year ASCVD risk >=5% to <7.5%) adults in whom a CAC score is measured for the purpose of making a treatment decision the following recommendations have been made:   If CAC = 0, it is reasonable to withhold statin therapy and reassess in 5 to 10 years, as long as higher risk conditions are absent (diabetes mellitus, family history of premature CHD in first degree relatives (males <55 years; females <65 years), cigarette smoking, LDL >=190 mg/dL or other independent risk factors).   If CAC is 1 to 99, it is reasonable to initiate statin therapy for patients >=68 years of age.   If CAC is >=100 or >=75th percentile, it is reasonable to initiate statin therapy at any age.   Cardiology referral should be considered for patients with CAC scores =400 or >=75th percentile.   *2018 AHA/ACC/AACVPR/AAPA/ABC/ACPM/ADA/AGS/APhA/ASPC/NLA/PCNA Guideline on the Management of Blood Cholesterol: A Report of the American College of Cardiology/American Heart Association Task Force on Clinical Practice Guidelines. J Am Coll Cardiol. 2019;73(24):3168-3209.   Rudean Haskell, MD     Electronically Signed   By: Rudean Haskell M.D.   On: 05/27/2021 12:45  EKG:  EKG is  ordered today.  The ekg ordered today demonstrates sinus with low voltage QRS, HR 76 bpm  Recent Labs: 04/07/2021: Magnesium 1.9 06/13/2021: ALT 12; BUN 19; Creatinine, Ser 1.72; Hemoglobin 10.7; Platelet Count 93; Potassium 4.8; Sodium 139; TSH 4.930  Recent Lipid Panel    Component Value  Date/Time   CHOL 125 02/18/2016 0454   TRIG 90 02/18/2016 0454   HDL 41 02/18/2016 0454   CHOLHDL 3.0 02/18/2016 0454   VLDL 18 02/18/2016 0454   LDLCALC 66 02/18/2016 0454     Risk Assessment/Calculations:    CHA2DS2-VASc Score =  6   This indicates a 9.7% annual risk of stroke. The patient's score is based upon: CHF History: 1 HTN History: 1 Diabetes History: 0 Stroke History: 0 Vascular Disease History: 1 (aortic atherosclerosis) Age Score: 2 Gender Score: 1      Physical Exam:    VS:  BP 124/72   Pulse 73   Ht '5\' 7"'$  (1.702 m)   Wt 186 lb 12.8 oz (84.7 kg)   SpO2 96%   BMI 29.26 kg/m     Wt Readings from Last 3 Encounters:  07/09/21 186 lb 12.8 oz (84.7 kg)  06/20/21 186 lb 3.2 oz (84.5 kg)  06/13/21 182 lb (82.6 kg)     GEN:  Well nourished, well developed in no acute distress HEENT: Normal NECK: No JVD LYMPHATICS: No lymphadenopathy CARDIAC: RRR, no murmurs, rubs, gallops RESPIRATORY:  Clear to auscultation without rales, wheezing or rhonchi  ABDOMEN: Soft, non-tender, non-distended MUSCULOSKELETAL:  No edema; No deformity  SKIN: Warm and dry. Diffuse rash all over body, see photos.  NEUROLOGIC:  Alert and oriented x 3 PSYCHIATRIC:  Normal affect          ASSESSMENT:    1. Persistent atrial fibrillation ([place])   2. Anemia, unspecified type   3. Myelodysplastic syndrome (HCC)   4. Elevated coronary artery calcium score   5. Rash    PLAN:    In order of problems listed above:  Persistent atrial fibrillation: maintaining sinus since DCCV and on amiodarone. Anticoagulated with ELiquis. Not felt to be  good long term candidate for [facility] given MDS and anemia. Plan for [place] with Watchman once rash has resolved (see below).   Elevated calcium score: noted on pre LAAO CT. Continue pravastatin .   Rash: About a week ago she developed a painless, diffuse rash all over her body (see photos above). She saw the dermatologist who felt like it was a possible vasculitis and questioned a dental infection.? I wonder if this is related to her MDS. He treated her with abx and steroids. She is seeing the dentist in mid July. Discussed with Dr. Quentin Ore and we did not feel comfortable proceeding with  LAAO with Watchman next week. We will postpone her surgery and see her back in several weeks to follow progression of rash.      Medication Adjustments/Labs and Tests Ordered: Current medicines are reviewed at length with the patient today.  Concerns regarding medicines are outlined above.  Orders Placed This Encounter  Procedures   EKG 12-Lead   No orders of the defined types were placed in this encounter.   Patient Instructions  Medication Instructions:  Your physician recommends that you continue on your current medications as directed. Please refer to the Current Medication list given to you today.  *If you need a refill on your cardiac medications before your next appointment, please call your pharmacy*   Lab Work: NONE If you have labs (blood work) drawn today and your tests are completely normal, you will receive your results only by: [place] (if you have MyChart) OR A paper copy in the mail If you have any lab test that is abnormal or we need to change your treatment, we will  call you to review the results.   Testing/Procedures: NON   Follow-Up: At [facility], you and your health needs are our priority.  As part of our continuing mission to provide you with exceptional heart care, we have created designated Provider Care Teams.  These Care Teams include your primary Cardiologist (physician) and Advanced Practice Providers (APPs -  Physician Assistants and Nurse Practitioners) who all work together to provide you with the care you need, when you need it.  We recommend signing up for the patient portal called "MyChart".  Sign up information is provided on this After Visit Summary.  MyChart is used to connect with patients for Virtual Visits (Telemedicine).  Patients are able to view lab/test results, encounter notes, upcoming appointments, etc.  Non-urgent messages can be sent to your provider as well.   To learn more about what you can do with MyChart, go to  [email or web address].    Your next appointment:   KEEP SCHEDULED FOLLOW-UP  Important Information About Sugar         Weston Brass Angelena Form, PA-C  07/09/2021 3:47 PM    [facility]

## 2021-07-08 DIAGNOSIS — L958 Other vasculitis limited to the skin: Secondary | ICD-10-CM | POA: Diagnosis not present

## 2021-07-09 ENCOUNTER — Ambulatory Visit: Payer: Medicare HMO | Admitting: Physician Assistant

## 2021-07-09 VITALS — BP 124/72 | HR 73 | Ht 67.0 in | Wt 186.8 lb

## 2021-07-09 DIAGNOSIS — D469 Myelodysplastic syndrome, unspecified: Secondary | ICD-10-CM

## 2021-07-09 DIAGNOSIS — R9431 Abnormal electrocardiogram [ECG] [EKG]: Secondary | ICD-10-CM | POA: Diagnosis not present

## 2021-07-09 DIAGNOSIS — D649 Anemia, unspecified: Secondary | ICD-10-CM | POA: Diagnosis not present

## 2021-07-09 DIAGNOSIS — R21 Rash and other nonspecific skin eruption: Secondary | ICD-10-CM

## 2021-07-09 DIAGNOSIS — R931 Abnormal findings on diagnostic imaging of heart and coronary circulation: Secondary | ICD-10-CM

## 2021-07-09 DIAGNOSIS — I4819 Other persistent atrial fibrillation: Secondary | ICD-10-CM

## 2021-07-09 NOTE — Patient Instructions (Signed)
Medication Instructions:  Your physician recommends that you continue on your current medications as directed. Please refer to the Current Medication list given to you today.  *If you need a refill on your cardiac medications before your next appointment, please call your pharmacy*   Lab Work: NONE If you have labs (blood work) drawn today and your tests are completely normal, you will receive your results only by: [place] (if you have MyChart) OR A paper copy in the mail If you have any lab test that is abnormal or we need to change your treatment, we will call you to review the results.   Testing/Procedures: NON   Follow-Up: At [facility], you and your health needs are our priority.  As part of our continuing mission to provide you with exceptional heart care, we have created designated Provider Care Teams.  These Care Teams include your primary Cardiologist (physician) and Advanced Practice Providers (APPs -  Physician Assistants and Nurse Practitioners) who all work together to provide you with the care you need, when you need it.  We recommend signing up for the patient portal called "MyChart".  Sign up information is provided on this After Visit Summary.  MyChart is used to connect with patients for Virtual Visits (Telemedicine).  Patients are able to view lab/test results, encounter notes, upcoming appointments, etc.  Non-urgent messages can be sent to your provider as well.   To learn more about what you can do with MyChart, go to [email or web address].    Your next appointment:   KEEP SCHEDULED FOLLOW-UP  Important Information About Sugar

## 2021-07-11 ENCOUNTER — Encounter: Payer: Self-pay | Admitting: Nurse Practitioner

## 2021-07-11 DIAGNOSIS — I129 Hypertensive chronic kidney disease with stage 1 through stage 4 chronic kidney disease, or unspecified chronic kidney disease: Secondary | ICD-10-CM | POA: Diagnosis not present

## 2021-07-11 DIAGNOSIS — D6869 Other thrombophilia: Secondary | ICD-10-CM | POA: Diagnosis not present

## 2021-07-11 DIAGNOSIS — I776 Arteritis, unspecified: Secondary | ICD-10-CM | POA: Diagnosis not present

## 2021-07-11 DIAGNOSIS — E785 Hyperlipidemia, unspecified: Secondary | ICD-10-CM | POA: Diagnosis not present

## 2021-07-11 DIAGNOSIS — I480 Paroxysmal atrial fibrillation: Secondary | ICD-10-CM | POA: Diagnosis not present

## 2021-07-11 DIAGNOSIS — N183 Chronic kidney disease, stage 3 unspecified: Secondary | ICD-10-CM | POA: Diagnosis not present

## 2021-07-11 DIAGNOSIS — I700 Atherosclerosis of aorta: Secondary | ICD-10-CM | POA: Diagnosis not present

## 2021-07-11 DIAGNOSIS — R3989 Other symptoms and signs involving the genitourinary system: Secondary | ICD-10-CM | POA: Diagnosis not present

## 2021-07-11 DIAGNOSIS — F331 Major depressive disorder, recurrent, moderate: Secondary | ICD-10-CM | POA: Diagnosis not present

## 2021-07-11 DIAGNOSIS — F419 Anxiety disorder, unspecified: Secondary | ICD-10-CM | POA: Diagnosis not present

## 2021-07-16 DIAGNOSIS — L958 Other vasculitis limited to the skin: Secondary | ICD-10-CM | POA: Diagnosis not present

## 2021-07-17 DIAGNOSIS — I4891 Unspecified atrial fibrillation: Secondary | ICD-10-CM

## 2021-07-22 ENCOUNTER — Inpatient Hospital Stay: Payer: Medicare HMO | Admitting: Oncology

## 2021-07-22 ENCOUNTER — Inpatient Hospital Stay: Payer: Medicare HMO | Attending: Oncology

## 2021-07-22 VITALS — BP 123/58 | HR 70 | Temp 98.2°F | Resp 18 | Ht 67.0 in | Wt 185.6 lb

## 2021-07-22 DIAGNOSIS — R918 Other nonspecific abnormal finding of lung field: Secondary | ICD-10-CM | POA: Insufficient documentation

## 2021-07-22 DIAGNOSIS — R61 Generalized hyperhidrosis: Secondary | ICD-10-CM | POA: Diagnosis not present

## 2021-07-22 DIAGNOSIS — R161 Splenomegaly, not elsewhere classified: Secondary | ICD-10-CM | POA: Insufficient documentation

## 2021-07-22 DIAGNOSIS — N189 Chronic kidney disease, unspecified: Secondary | ICD-10-CM | POA: Diagnosis not present

## 2021-07-22 DIAGNOSIS — D649 Anemia, unspecified: Secondary | ICD-10-CM

## 2021-07-22 DIAGNOSIS — G4730 Sleep apnea, unspecified: Secondary | ICD-10-CM | POA: Insufficient documentation

## 2021-07-22 DIAGNOSIS — R2681 Unsteadiness on feet: Secondary | ICD-10-CM | POA: Insufficient documentation

## 2021-07-22 DIAGNOSIS — I2510 Atherosclerotic heart disease of native coronary artery without angina pectoris: Secondary | ICD-10-CM | POA: Diagnosis not present

## 2021-07-22 DIAGNOSIS — I129 Hypertensive chronic kidney disease with stage 1 through stage 4 chronic kidney disease, or unspecified chronic kidney disease: Secondary | ICD-10-CM | POA: Diagnosis not present

## 2021-07-22 DIAGNOSIS — R0609 Other forms of dyspnea: Secondary | ICD-10-CM | POA: Insufficient documentation

## 2021-07-22 DIAGNOSIS — R634 Abnormal weight loss: Secondary | ICD-10-CM | POA: Insufficient documentation

## 2021-07-22 DIAGNOSIS — I4891 Unspecified atrial fibrillation: Secondary | ICD-10-CM | POA: Diagnosis not present

## 2021-07-22 DIAGNOSIS — Z79899 Other long term (current) drug therapy: Secondary | ICD-10-CM | POA: Diagnosis not present

## 2021-07-22 DIAGNOSIS — R21 Rash and other nonspecific skin eruption: Secondary | ICD-10-CM | POA: Insufficient documentation

## 2021-07-22 DIAGNOSIS — D539 Nutritional anemia, unspecified: Secondary | ICD-10-CM | POA: Insufficient documentation

## 2021-07-22 DIAGNOSIS — R630 Anorexia: Secondary | ICD-10-CM | POA: Insufficient documentation

## 2021-07-22 DIAGNOSIS — D696 Thrombocytopenia, unspecified: Secondary | ICD-10-CM | POA: Insufficient documentation

## 2021-07-22 LAB — CBC WITH DIFFERENTIAL (CANCER CENTER ONLY)
Abs Immature Granulocytes: 0.03 10*3/uL (ref 0.00–0.07)
Basophils Absolute: 0 10*3/uL (ref 0.0–0.1)
Basophils Relative: 1 %
Eosinophils Absolute: 0 10*3/uL (ref 0.0–0.5)
Eosinophils Relative: 0 %
HCT: 35 % — ABNORMAL LOW (ref 36.0–46.0)
Hemoglobin: 11.1 g/dL — ABNORMAL LOW (ref 12.0–15.0)
Immature Granulocytes: 1 %
Lymphocytes Relative: 30 %
Lymphs Abs: 1.3 10*3/uL (ref 0.7–4.0)
MCH: 30.3 pg (ref 26.0–34.0)
MCHC: 31.7 g/dL (ref 30.0–36.0)
MCV: 95.6 fL (ref 80.0–100.0)
Monocytes Absolute: 0.7 10*3/uL (ref 0.1–1.0)
Monocytes Relative: 15 %
Neutro Abs: 2.4 10*3/uL (ref 1.7–7.7)
Neutrophils Relative %: 53 %
Platelet Count: 113 10*3/uL — ABNORMAL LOW (ref 150–400)
RBC: 3.66 MIL/uL — ABNORMAL LOW (ref 3.87–5.11)
RDW: 16.3 % — ABNORMAL HIGH (ref 11.5–15.5)
WBC Count: 4.5 10*3/uL (ref 4.0–10.5)
nRBC: 0 % (ref 0.0–0.2)

## 2021-07-22 LAB — LACTATE DEHYDROGENASE: LDH: 1066 U/L — ABNORMAL HIGH (ref 98–192)

## 2021-07-22 NOTE — Progress Notes (Deleted)
  [place] OFFICE PROGRESS NOTE   Diagnosis:   INTERVAL HISTORY:   ***  Objective:  Vital signs in last 24 hours:  Blood pressure (!) 123/58, pulse 70, temperature 98.2 F (36.8 C), temperature source Oral, resp. rate 18, height $RemoveBe'5\' 7"'iSwqMjJJi$  (1.702 m), weight 185 lb 9.6 oz (84.2 kg), SpO2 98 %.    HEENT: *** Lymphatics: *** Resp: *** Cardio: *** GI: *** Vascular: *** Neuro:***  Skin:***   Portacath/PICC-without erythema  Lab Results:  Lab Results  Component Value Date   WBC 4.5 07/22/2021   HGB 11.1 (L) 07/22/2021   HCT 35.0 (L) 07/22/2021   MCV 95.6 07/22/2021   PLT 113 (L) 07/22/2021   NEUTROABS 2.4 07/22/2021    CMP  Lab Results  Component Value Date   NA 139 06/13/2021   K 4.8 06/13/2021   CL 103 06/13/2021   CO2 27 06/13/2021   GLUCOSE 97 06/13/2021   BUN 19 06/13/2021   CREATININE 1.72 (H) 06/13/2021   CALCIUM 9.4 06/13/2021   PROT 6.6 06/13/2021   ALBUMIN 3.6 (L) 06/13/2021   AST 28 06/13/2021   ALT 12 06/13/2021   ALKPHOS 75 06/13/2021   BILITOT 0.6 06/13/2021   GFRNONAA 34 (L) 05/05/2021   GFRAA 46 (L) 11/15/2017    No results found for: "CEA1", "CEA", "TZG017", "CA125"  Lab Results  Component Value Date   INR 1.2 09/12/2020   LABPROT 15.5 (H) 09/12/2020    Imaging:  No results found.  Medications: I have reviewed the patient's current medications.   Assessment/Plan: Macrocytic anemia/thrombocytopenia 08/13/2020 B12 3500 08/13/2020 LDH 612 08/13/2020 myeloma panel-no monoclonal protein  Bone marrow biopsy 08/20/2020-hypercellular marrow with erythroid hyperplasia and dyspoiesis, rare lipogranuloma like lesions.  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  Negative myeloma FISH panel, 46XX; neotype myeloid disorders profile NGS sequencing-no pathogenic mutations detected in any of the genes on the NGS panel [facility] screen - 08/26/2020 CTs 02/17/2021-new splenomegaly; diffuse bilateral bronchial wall thickening;  clustered groundglass and fine nodular opacity in the dependent right lower lobe.  CAD. Fatigue Dyspnea on exertion Unsteady gait/balance disorder, followed by Dr. Tomi Likens CKD Atrial fibrillation Hypertension Sleep apnea Anorexia/weight loss 06/13/2021 skin rash, question vasculitis-course of prednisone initiated; 06/20/2021 marked improvement    Disposition: ***  Betsy Coder, MD  07/22/2021  10:14 AM

## 2021-07-22 NOTE — Progress Notes (Signed)
  [facility] OFFICE PROGRESS NOTE   Diagnosis: Anemia, thrombocytopenia  INTERVAL HISTORY:   Ms. Pilant returns as scheduled.  She reports the rash returned when she was tapered off her prednisone.  She resumed a prednisone taper with Dr. Nevada Crane.  The rash has improved.  No fever.  She had night sweats when she restarted prednisone.  Objective:  Vital signs in last 24 hours:  Blood pressure (!) 123/58, pulse 70, temperature 98.2 F (36.8 C), temperature source Oral, resp. rate 18, height $RemoveBe'5\' 7"'WhebTlYME$  (1.702 m), weight 185 lb 9.6 oz (84.2 kg), SpO2 98 %.    HEENT: No thrush Lymphatics: No cervical, supraclavicular, axillary, or inguinal nodes Resp: Lungs clear bilaterally Cardio: Regular rate and rhythm with premature beats GI: No hepatosplenomegaly Vascular: No leg edema  Skin: Fading erythematous rash over the back  Lab Results:  Lab Results  Component Value Date   WBC 4.5 07/22/2021   HGB 11.1 (L) 07/22/2021   HCT 35.0 (L) 07/22/2021   MCV 95.6 07/22/2021   PLT 113 (L) 07/22/2021   NEUTROABS 2.4 07/22/2021    CMP  Lab Results  Component Value Date   NA 139 06/13/2021   K 4.8 06/13/2021   CL 103 06/13/2021   CO2 27 06/13/2021   GLUCOSE 97 06/13/2021   BUN 19 06/13/2021   CREATININE 1.72 (H) 06/13/2021   CALCIUM 9.4 06/13/2021   PROT 6.6 06/13/2021   ALBUMIN 3.6 (L) 06/13/2021   AST 28 06/13/2021   ALT 12 06/13/2021   ALKPHOS 75 06/13/2021   BILITOT 0.6 06/13/2021   GFRNONAA 34 (L) 05/05/2021   GFRAA 46 (L) 11/15/2017    Medications: I have reviewed the patient's current medications.   Assessment/Plan: Macrocytic anemia/thrombocytopenia 08/13/2020 B12 3500 08/13/2020 LDH 612 08/13/2020 myeloma panel-no monoclonal protein  Bone marrow biopsy 08/20/2020-hypercellular marrow with erythroid hyperplasia and dyspoiesis, rare lipogranuloma like lesions.  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  Negative myeloma FISH panel, 46XX; neotype  myeloid disorders profile NGS sequencing-no pathogenic mutations detected in any of the genes on the NGS panel [facility] screen - 08/26/2020 CTs 02/17/2021-new splenomegaly; diffuse bilateral bronchial wall thickening; clustered groundglass and fine nodular opacity in the dependent right lower lobe.  CAD. Fatigue Dyspnea on exertion Unsteady gait/balance disorder, followed by Dr. Tomi Likens CKD Atrial fibrillation Hypertension Sleep apnea Anorexia/weight loss 06/13/2021 skin rash, question vasculitis-course of prednisone initiated; 06/20/2021 marked improvement, rash recurred when she was tapered off of prednisone, prednisone resumed by dermatology    Disposition: Ms. Rosman has a history of anemia/thrombocytopenia with an elevated LDH.  She has undergone an extensive hematology evaluation without a specific diagnosis.  She developed a rash last month, potentially related to an underlying hematologic condition.  The rash improved with prednisone.  She will continue prednisone taper with Dr. Nevada Crane.  She is stable from a hematologic standpoint.  The plan is to refer her for a repeat bone marrow biopsy if she develops progressive cytopenias.  Ms. Lindvall will return for an office and lab visit in 6 weeks.  Betsy Coder, MD  07/22/2021  10:27 AM

## 2021-07-30 ENCOUNTER — Ambulatory Visit: Payer: Medicare HMO

## 2021-07-30 ENCOUNTER — Telehealth: Payer: Self-pay | Admitting: Cardiology

## 2021-07-30 NOTE — Telephone Encounter (Signed)
Left message to call back  

## 2021-07-30 NOTE — Telephone Encounter (Signed)
  [facility]   MULTIDISCIPLINARY HEART VALVE TEAM   Called the patient in reference to missed appointment earlier this morning. Calls went to VM. Left message to call back to reschedule.   Kathyrn Drown NP-C Structural Heart Team  Pager: [phone] Phone: [phone]

## 2021-07-31 NOTE — Telephone Encounter (Signed)
The patient apologized for missing appointment - her husband is sick and in the hospital.  She still wishes to proceed with LAAO on 8/17 pending Structural Heart Team assessment.  Rescheduled the patient for visit on 7/28 with Nell Range.  She was grateful for call and agrees with plan.

## 2021-08-04 ENCOUNTER — Encounter: Payer: Self-pay | Admitting: Nurse Practitioner

## 2021-08-06 ENCOUNTER — Telehealth: Payer: Self-pay | Admitting: Nurse Practitioner

## 2021-08-06 DIAGNOSIS — D649 Anemia, unspecified: Secondary | ICD-10-CM

## 2021-08-06 NOTE — Telephone Encounter (Signed)
I contacted [name] [name] to discuss the MyChart message she left regarding her mother.  She reports there has been an overall decline in [name] [name]'s condition over the past 4 to 7 days.  She has no energy.  She is short of breath at times.  She has a persistent rash on her back.  We will plan to see [name] [name] in an office visit 08/07/2021.

## 2021-08-06 NOTE — Progress Notes (Deleted)
HE[facility] AND [place]                                     Cardiology Office Note:    Date:  08/06/2021   ID:  [name] [name], DOB [birth year], MRN [id number]  PCP:  Harlan Stains, MD  [place] Cardiologist:  Candee Furbish, MD  [facility] Electrophysiologist:  Vickie Epley, MD   Referring MD: Harlan Stains, MD   Discuss upcoming [place] with Watchman  History of Present Illness:    [name] [name] is a 76 y.o. female with a hx of paroxysmal atrial fibrillation on Eliquis, CKD stage 3, hypertension, hyperlipidemia, sleep apnea not on CPAP, GERD, anemia, myelodysplastic syndrome, anemia, thrombocytopenia and arthritis who presents to clinic for follow up.   She followed up with Laurann Montana, NP 04/17/2021. 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 She has undergone an extensive hematology evaluation without a specific diagnosis.  \   She presented to the ED 04/07/2021 complaining of palpitations. Her heart rate was greater than 150 bpm, not relieved with home metoprolol. She was found to be in Afib with RVR, anticoagulated on DOAC. Cardioversion was performed and she reverted to sinus rhythm. She did have 2 cardioversions prior, one on 03/22/2021, another 4+ years ago.  She was seen by Dr. Quentin Ore in the office on 04/18/21 for consideration of Watchman given anemia and MDS. She was started on amiodarone and set up for pre Watchman CT. This showed moderate LA dilation, severe RA dilation,LAA with a large mixed chicken wing / broccoli type with two lobes suitable for LAAO with Watchman, as well as a coronary calcium score of 305 (79th percentile for age, sex, and race matched control) and dilation of the pulmonary artery.   I saw her in the office for pre procedure visit on 07/09/21 but she was dealing with a possible vasculitis induced rash and being  treated with steroids and abx so Watchman was postponed.   Today the patient presents to clinic for follow up. She had a rapid improvement with steroids but rash recurred when tapered off. She was put back on steroids by dermatology.    Past Medical History:  Diagnosis Date   A-fib ([place])    paroxysmal   Anxiety    Arthritis    Cataracts, bilateral    immature   CKD (chronic kidney disease) stage 3, GFR 30-59 ml/min (HCC)    Depression    GERD (gastroesophageal reflux disease)    was on Nexium    GERD (gastroesophageal reflux disease)    Hemorrhoid    History of bronchitis    couple of yrs ago   History of colon polyps    History of peristent atrial fibrillation    Hyperlipidemia    takes Pravastatin daily   Hypertension    takes Amlodipine and Diovan daily   Hypertension    Pre-diabetes    Sleep apnea    no cpap used   Sleep apnea    study done 20+yrs ago and doesn't use cpap   Urinary urgency    Weakness    in left leg r/t back    Past Surgical History:  Procedure Laterality Date   ABDOMINAL HYSTERECTOMY  1980   partial   ABDOMINAL HYSTERECTOMY  partial   BACK SURGERY     CARDIOVERSION N/A 02/24/2016   Procedure: CARDIOVERSION;  Surgeon: Fay Records, MD;  Location: [facility];  Service: Cardiovascular;  Laterality: N/A;   CARDIOVERSION N/A 03/19/2016   Procedure: CARDIOVERSION;  Surgeon: Fay Records, MD;  Location: [facility];  Service: Cardiovascular;  Laterality: N/A;   CARDIOVERSION N/A 05/15/2021   Procedure: CARDIOVERSION;  Surgeon: Pixie Casino, MD;  Location: [facility];  Service: Cardiovascular;  Laterality: N/A;   [place]     COLONOSCOPY     COLONOSCOPY WITH PROPOFOL  12/29/2011   Procedure: COLONOSCOPY WITH PROPOFOL;  Surgeon: Lear Ng, MD;  Location: [facility];  Service: Endoscopy;  Laterality: N/A;   COLONOSCOPY WITH PROPOFOL N/A 05/24/2014   Procedure: COLONOSCOPY  WITH PROPOFOL;  Surgeon: Wilford Corner, MD;  [facility];  Service: Endoscopy;  Laterality: N/A;   ESOPHAGEAL MANOMETRY N/A 02/22/2017   Procedure: ESOPHAGEAL MANOMETRY (EM);  Surgeon: Wilford Corner, MD;  [facility];  Service: Endoscopy;  Laterality: N/A;   ESOPHAGOGASTRODUODENOSCOPY     FOOT SURGERY     left, right   FRACTURE SURGERY     left leg-knee   HEEL SPUR EXCISION Bilateral    HOT HEMOSTASIS  12/29/2011   Procedure: HOT HEMOSTASIS (ARGON PLASMA COAGULATION/BICAP);  Surgeon: Lear Ng, MD;  Location: [facility];  Service: Endoscopy;  Laterality: N/A;   HOT HEMOSTASIS N/A 05/24/2014   Procedure: HOT HEMOSTASIS (ARGON PLASMA COAGULATION/BICAP);  Surgeon: Wilford Corner, MD;  Location: [facility];  Service: Endoscopy;  Laterality: N/A;   leg surgery d/t break Left    LUMBAR LAMINECTOMY/DECOMPRESSION MICRODISCECTOMY Left 04/05/2013   Procedure: LUMBAR FOUR TO FIVE LUMBAR LAMINECTOMY/DECOMPRESSION MICRODISCECTOMY 1 LEVEL;  Surgeon: Eustace Moore, MD;  Location: [facility];  Service: Neurosurgery;  Laterality: Left;   NISSEN FUNDOPLICATION     [place] IMPEDANCE STUDY N/A 02/22/2017   Procedure: [place] IMPEDANCE STUDY;  Surgeon: Wilford Corner, MD;  [facility];  Service: Endoscopy;  Laterality: N/A;   TEE WITHOUT CARDIOVERSION N/A 02/24/2016   Procedure: TRANSESOPHAGEAL ECHOCARDIOGRAM (TEE);  Surgeon: Fay Records, MD;  Location: [facility];  Service: Cardiovascular;  Laterality: N/A;   TOTAL KNEE ARTHROPLASTY Left 10/07/2016   TOTAL KNEE ARTHROPLASTY Left 10/07/2016   Procedure: TOTAL KNEE ARTHROPLASTY;  Surgeon: [name], MD;  Location: [facility];  Service: Orthopedics;  Laterality: Left;    Current Medications: No outpatient medications have been marked as taking for the 08/08/21 encounter (Appointment) with [facility].     Allergies:   Omnicef [cefdinir], Bacitracin, Bacitracin-polymyxin b, Duloxetine hcl,  Lexapro [escitalopram], Mirtazapine, Penicillins, Peroxide [hydrogen peroxide], Zoloft [sertraline hcl], and Neosporin [neomycin-bacitracin zn-polymyx]   Social History   Socioeconomic History   Marital status: Married    Spouse name: [name] [name]   Number of children: 2   Years of education: 12   Highest education level: Some college, no degree  Occupational History   Occupation: retired  Tobacco Use   Smoking status: Never   Smokeless tobacco: Never   Tobacco comments:    Never smoke 05/22/21  Vaping Use   Vaping Use: Never used  Substance and Sexual Activity   Alcohol use: No   Drug use: No   Sexual activity: Yes    Birth control/protection: Surgical  Other Topics Concern   Not on file  Social History Narrative   Right handed   Drinks  caffeine   Two story home   Social Determinants of Health   Financial Resource Strain: Low Risk  (11/25/2016)   Overall Financial Resource Strain (CARDIA)    Difficulty of Paying Living Expenses: Not very hard  Food Insecurity: Unknown (11/25/2016)   Hunger Vital Sign    Worried About Running Out of Food in the Last Year: Patient refused    [place] in the Last Year: Patient refused  Transportation Needs: Unknown (11/25/2016)   [place] - Hydrologist (Medical): Patient refused    Lack of Transportation (Non-Medical): Patient refused  Physical Activity: Unknown (11/25/2016)   Exercise Vital Sign    Days of Exercise per Week: Patient refused    Minutes of Exercise per Session: Patient refused  Stress: Stress Concern Present (11/25/2016)   [place]    Feeling of Stress : Very much  Social Connections: Moderately Integrated (11/25/2016)   Social Connection and Isolation Panel [NHANES]    Frequency of Communication with Friends and Family: Twice a week    Frequency of Social Gatherings with Friends and Family: Twice a week    Attends  Religious Services: More than 4 times per year    Active Member of [employer] or Organizations: No    Attends Archivist Meetings: Never    Marital Status: Married     Family History: The patient's family history includes Colon cancer in her mother; Heart disease in her father. There is no history of Breast cancer or Thyroid disease.  ROS:   Please see the history of present illness.    All other systems reviewed and are negative.  EKGs/Labs/Other Studies Reviewed:    The following studies were reviewed today:  Cardiac CT 05/27/21 IMPRESSION: 1. There is normal pulmonary vein drainage into the left atrium. Moderate left atrial dilation. Severe right atrial dilation.   2. The left atrial appendage is large - mixed chicken wing / broccoli type with two lobes. There is a perfusion defect that resolved in the delay imaging. This is not suggestive of thrombus in the left atrial appendage. Measurements for LAA-O device as above.   3. The esophagus runs in the left atrial midline but is in closer proximity to the right lower pulmonary vein ostium.   4. Coronary calcium score of 305. This was 79th percentile for age, sex, and race matched control.   5. Dilation of the main pulmonary artery 30 mm with trunk to aorta ratio of ? 0.9. This can be associated with the presence of pulmonary hypertension; clinical correlation advised.   6. Moderate mitral annular calcification.   7. IVC dilation: 31 mm   RECOMMENDATIONS:   Coronary artery calcium (CAC) score is a strong predictor of incident coronary heart disease (CHD) and provides predictive information beyond traditional risk factors. CAC scoring is reasonable to use in the decision to withhold, postpone, or initiate statin therapy in intermediate-risk or selected borderline-risk asymptomatic adults (age 28-75 years and LDL-C >=70 to <190 mg/dL) who do not have diabetes or established atherosclerotic cardiovascular disease  (ASCVD).* In intermediate-risk (10-year ASCVD risk >=7.5% to <20%) adults or selected borderline-risk (10-year ASCVD risk >=5% to <7.5%) adults in whom a CAC score is measured for the purpose of making a treatment decision the following recommendations have been made:   If CAC = 0, it is reasonable to withhold statin therapy and reassess in 5 to 10 years, as long as higher  risk conditions are absent (diabetes mellitus, family history of premature CHD in first degree relatives (males <55 years; females <65 years), cigarette smoking, LDL >=190 mg/dL or other independent risk factors).   If CAC is 1 to 99, it is reasonable to initiate statin therapy for patients >=73 years of age.   If CAC is >=100 or >=75th percentile, it is reasonable to initiate statin therapy at any age.   Cardiology referral should be considered for patients with CAC scores =400 or >=75th percentile.   *2018 AHA/ACC/AACVPR/AAPA/ABC/ACPM/ADA/AGS/APhA/ASPC/NLA/PCNA Guideline on the Management of Blood Cholesterol: A Report of the American College of Cardiology/American Heart Association Task Force on Clinical Practice Guidelines. J Am Coll Cardiol. 2019;73(24):3168-3209.   Rudean Haskell, MD     Electronically Signed   By: Rudean Haskell M.D.   On: 05/27/2021 12:45  EKG:  EKG is  ordered today.  The ekg ordered today demonstrates sinus with low voltage QRS, HR 76 bpm  Recent Labs: 04/07/2021: Magnesium 1.9 06/13/2021: ALT 12; BUN 19; Creatinine, Ser 1.72; Potassium 4.8; Sodium 139; TSH 4.930 07/22/2021: Hemoglobin 11.1; Platelet Count 113  Recent Lipid Panel    Component Value Date/Time   CHOL 125 02/18/2016 0454   TRIG 90 02/18/2016 0454   HDL 41 02/18/2016 0454   CHOLHDL 3.0 02/18/2016 0454   VLDL 18 02/18/2016 0454   LDLCALC 66 02/18/2016 0454     Risk Assessment/Calculations:    CHA2DS2-VASc Score = 6   This indicates a 9.7% annual risk of stroke. The patient's score is based  upon: CHF History: 1 HTN History: 1 Diabetes History: 0 Stroke History: 0 Vascular Disease History: 1 (aortic atherosclerosis) Age Score: 2 Gender Score: 1   {This patient has a significant risk of stroke if diagnosed with atrial fibrillation.  Please consider VKA or DOAC agent for anticoagulation if the bleeding risk is acceptable.   You can also use the SmartPhrase .[place] for documentation.   :[id number]}   Physical Exam:    VS:  There were no vitals taken for this visit.    Wt Readings from Last 3 Encounters:  07/22/21 185 lb 9.6 oz (84.2 kg)  07/09/21 186 lb 12.8 oz (84.7 kg)  06/20/21 186 lb 3.2 oz (84.5 kg)     GEN:  Well nourished, well developed in no acute distress HEENT: Normal NECK: No JVD LYMPHATICS: No lymphadenopathy CARDIAC: RRR, no murmurs, rubs, gallops RESPIRATORY:  Clear to auscultation without rales, wheezing or rhonchi  ABDOMEN: Soft, non-tender, non-distended MUSCULOSKELETAL:  No edema; No deformity  SKIN: Warm and dry. Diffuse rash all over body, see photos.  NEUROLOGIC:  Alert and oriented x 3 PSYCHIATRIC:  Normal affect     ASSESSMENT:    1. Persistent atrial fibrillation (HCC)   2. Elevated coronary artery calcium score   3. Rash     PLAN:    In order of problems listed above:  Persistent atrial fibrillation: maintaining sinus since DCCV and on amiodarone. Anticoagulated with ELiquis. Not felt to be  good long term candidate for [facility] given MDS and anemia. Plan for LAAO with Watchman   Elevated calcium score: noted on pre LAAO CT. Continue pravastatin .   Rash:    Medication Adjustments/Labs and Tests Ordered: Current medicines are reviewed at length with the patient today.  Concerns regarding medicines are outlined above.  No orders of the defined types were placed in this encounter.  No orders of the defined types were placed in this encounter.   There are  no Patient Instructions on file for this visit.   Signed, Angelena Form, PA-C  08/06/2021 1:31 PM    [facility]

## 2021-08-07 ENCOUNTER — Encounter: Payer: Self-pay | Admitting: Nurse Practitioner

## 2021-08-07 ENCOUNTER — Telehealth: Payer: Self-pay | Admitting: *Deleted

## 2021-08-07 ENCOUNTER — Inpatient Hospital Stay: Payer: Medicare HMO | Admitting: Nurse Practitioner

## 2021-08-07 ENCOUNTER — Inpatient Hospital Stay: Payer: Medicare HMO

## 2021-08-07 ENCOUNTER — Telehealth: Payer: Self-pay | Admitting: Cardiology

## 2021-08-07 VITALS — BP 107/48 | HR 76 | Temp 98.7°F | Resp 20 | Ht 67.0 in | Wt 187.6 lb

## 2021-08-07 DIAGNOSIS — D649 Anemia, unspecified: Secondary | ICD-10-CM

## 2021-08-07 DIAGNOSIS — R0609 Other forms of dyspnea: Secondary | ICD-10-CM | POA: Diagnosis not present

## 2021-08-07 DIAGNOSIS — N189 Chronic kidney disease, unspecified: Secondary | ICD-10-CM | POA: Diagnosis not present

## 2021-08-07 DIAGNOSIS — R21 Rash and other nonspecific skin eruption: Secondary | ICD-10-CM | POA: Diagnosis not present

## 2021-08-07 DIAGNOSIS — D696 Thrombocytopenia, unspecified: Secondary | ICD-10-CM | POA: Diagnosis not present

## 2021-08-07 DIAGNOSIS — I129 Hypertensive chronic kidney disease with stage 1 through stage 4 chronic kidney disease, or unspecified chronic kidney disease: Secondary | ICD-10-CM | POA: Diagnosis not present

## 2021-08-07 DIAGNOSIS — G4730 Sleep apnea, unspecified: Secondary | ICD-10-CM | POA: Diagnosis not present

## 2021-08-07 DIAGNOSIS — R61 Generalized hyperhidrosis: Secondary | ICD-10-CM | POA: Diagnosis not present

## 2021-08-07 DIAGNOSIS — R2681 Unsteadiness on feet: Secondary | ICD-10-CM | POA: Diagnosis not present

## 2021-08-07 DIAGNOSIS — D539 Nutritional anemia, unspecified: Secondary | ICD-10-CM | POA: Diagnosis not present

## 2021-08-07 LAB — CBC WITH DIFFERENTIAL (CANCER CENTER ONLY)
Abs Immature Granulocytes: 0.01 10*3/uL (ref 0.00–0.07)
Basophils Absolute: 0 10*3/uL (ref 0.0–0.1)
Basophils Relative: 0 %
Eosinophils Absolute: 0.1 10*3/uL (ref 0.0–0.5)
Eosinophils Relative: 2 %
HCT: 31.1 % — ABNORMAL LOW (ref 36.0–46.0)
Hemoglobin: 9.9 g/dL — ABNORMAL LOW (ref 12.0–15.0)
Immature Granulocytes: 0 %
Lymphocytes Relative: 24 %
Lymphs Abs: 0.5 10*3/uL — ABNORMAL LOW (ref 0.7–4.0)
MCH: 29.7 pg (ref 26.0–34.0)
MCHC: 31.8 g/dL (ref 30.0–36.0)
MCV: 93.4 fL (ref 80.0–100.0)
Monocytes Absolute: 0.7 10*3/uL (ref 0.1–1.0)
Monocytes Relative: 30 %
Neutro Abs: 1 10*3/uL — ABNORMAL LOW (ref 1.7–7.7)
Neutrophils Relative %: 44 %
Platelet Count: 114 10*3/uL — ABNORMAL LOW (ref 150–400)
RBC: 3.33 MIL/uL — ABNORMAL LOW (ref 3.87–5.11)
RDW: 17.2 % — ABNORMAL HIGH (ref 11.5–15.5)
WBC Count: 2.3 10*3/uL — ABNORMAL LOW (ref 4.0–10.5)
nRBC: 0 % (ref 0.0–0.2)

## 2021-08-07 LAB — CMP (CANCER CENTER ONLY)
ALT: 7 U/L (ref 0–44)
AST: 17 U/L (ref 15–41)
Albumin: 3.2 g/dL — ABNORMAL LOW (ref 3.5–5.0)
Alkaline Phosphatase: 48 U/L (ref 38–126)
Anion gap: 7 (ref 5–15)
BUN: 22 mg/dL (ref 8–23)
CO2: 27 mmol/L (ref 22–32)
Calcium: 9.2 mg/dL (ref 8.9–10.3)
Chloride: 103 mmol/L (ref 98–111)
Creatinine: 1.69 mg/dL — ABNORMAL HIGH (ref 0.44–1.00)
GFR, Estimated: 31 mL/min — ABNORMAL LOW (ref >60)
Glucose, Bld: 99 mg/dL (ref 70–99)
Potassium: 4.8 mmol/L (ref 3.5–5.1)
Sodium: 137 mmol/L (ref 135–145)
Total Bilirubin: 0.8 mg/dL (ref 0.3–1.2)
Total Protein: 5.6 g/dL — ABNORMAL LOW (ref 6.5–8.1)

## 2021-08-07 LAB — LACTATE DEHYDROGENASE: LDH: 1226 U/L — ABNORMAL HIGH (ref 98–192)

## 2021-08-07 NOTE — Telephone Encounter (Signed)
[facility] not able to accommodate bone marrow biopsy in time MD requests. Scheduled with [facility] for 08/13/21 at 0730/0830. Informed her daughter of arrival time, address of hospital and to [facility]. NPO after midnight and she will need driver and someone to stay with her.  

## 2021-08-07 NOTE — Progress Notes (Signed)
  [name] [name] OFFICE PROGRESS NOTE   Diagnosis: [name], [name]  INTERVAL HISTORY:   Ms. [name] returns prior to scheduled follow-up.  Her daughter recently contacted the office to report an overall decline in [name] [name]'s condition.  She has marked fatigue.  No fever.  Drenching sweats sometimes 2 times a night.  She feels short of breath intermittently.  No cough.  She has not noticed any enlarged lymph nodes.  Occasional rectal bleeding which she attributes to hemorrhoids.  No diarrhea.  No urinary symptoms.  Appetite is poor.  Gait unsteady at times.  Objective:  Vital signs in last 24 hours:  Blood pressure (!) 107/48, pulse 76, temperature 98.7 F (37.1 C), temperature source Oral, resp. rate 20, height $RemoveBe'5\' 7"'PCFHhhptn$  (1.702 m), weight 187 lb 9.6 oz (85.1 kg), SpO2 95 %.    HEENT: No thrush or ulcers. Lymphatics: No palpable cervical, supraclavicular or axillary lymph nodes. Resp: Lungs clear bilaterally. Cardio: Regular rate and rhythm. GI: Abdomen soft, scattered areas of tenderness.  Fullness left upper abdomen. Vascular: No leg edema. Neuro: Alert and oriented. Skin: Faint erythematous rash over the back.   Lab Results:  Lab Results  Component Value Date   WBC 2.3 (L) 08/07/2021   HGB 9.9 (L) 08/07/2021   HCT 31.1 (L) 08/07/2021   MCV 93.4 08/07/2021   PLT 114 (L) 08/07/2021   NEUTROABS 1.0 (L) 08/07/2021    Imaging:  No results found.  Medications: I have reviewed the patient's current medications.  Assessment/Plan: Macrocytic [name]/[name] 08/13/2020 B12 3500 08/13/2020 LDH 612 08/13/2020 myeloma panel-no monoclonal protein  Bone marrow biopsy 08/20/2020-hypercellular marrow with erythroid hyperplasia and dyspoiesis, rare lipogranuloma like lesions.  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  Negative myeloma FISH panel, 46XX; neotype myeloid disorders profile NGS sequencing-no pathogenic mutations detected in any of  the genes on the NGS panel [facility] screen - 08/26/2020 CTs 02/17/2021-new splenomegaly; diffuse bilateral bronchial wall thickening; clustered groundglass and fine nodular opacity in the dependent right lower lobe.  CAD. Fatigue Dyspnea on exertion Unsteady gait/balance disorder, followed by Dr. Tomi Likens CKD Atrial fibrillation Hypertension Sleep apnea Anorexia/weight loss 06/13/2021 skin rash, question vasculitis-course of prednisone initiated; 06/20/2021 marked improvement, rash recurred when she was tapered off of prednisone, prednisone resumed by dermatology    Disposition: [name] [name] has progressive pancytopenia.  LDH remains markedly elevated.  She has sweats and fatigue/malaise.  She has splenomegaly on CT.  Extensive hematologic evaluation thus far has not identified a specific diagnosis.  We discussed different possibilities including MDS, myeloproliferative disorder, lymphoma, infection, autoimmune disorder.  We are referring her for a repeat bone marrow biopsy.  Also check ANA and rheumatoid factor.  She will return for follow-up in 2 weeks.  Patient seen with Dr. Benay Spice.   Ned Card ANP/GNP-BC   08/07/2021  11:19 AM This was a shared visit with Ned Card.  [name] [name] was interviewed and examined.  She continues to have symptoms of a systemic illness.  The etiology remains unclear.  We discussed the differential diagnosis with [name] [name] and her daughter.  This includes rheumatic symptoms related to a myelodysplastic or myeloproliferative disorder.  The differential diagnosis also includes lymphoma, collagen vascular disease, and infections.  She will be referred for repeat bone marrow biopsy.  I was present for greater than 50% of today's visit.  I performed medical decision making.  [name] Manson, MD

## 2021-08-07 NOTE — Telephone Encounter (Signed)
Patient called regarding upcoming appointment scheduled for tomorrow, 08/08/21. She continues to have systemic rashes and was seen by her hematologist/oncologist today with plans to pursue further testing to include a bone marrow biopsy next week. She would like to hold off on her Watchman workup until more definitive answers come from her oncology team. She will follow with Dr. Learta Codding in 2 weeks. I will tentatively move her appointment out several weeks and she will keep [place] updated on her status.   Kathyrn Drown NP-C Structural Heart Team  Pager: [phone] Phone: [phone]

## 2021-08-08 ENCOUNTER — Ambulatory Visit: Payer: Medicare HMO

## 2021-08-08 ENCOUNTER — Inpatient Hospital Stay: Payer: Medicare HMO

## 2021-08-08 ENCOUNTER — Telehealth: Payer: Self-pay

## 2021-08-08 ENCOUNTER — Other Ambulatory Visit: Payer: Self-pay | Admitting: *Deleted

## 2021-08-08 DIAGNOSIS — R61 Generalized hyperhidrosis: Secondary | ICD-10-CM | POA: Diagnosis not present

## 2021-08-08 DIAGNOSIS — N189 Chronic kidney disease, unspecified: Secondary | ICD-10-CM | POA: Diagnosis not present

## 2021-08-08 DIAGNOSIS — G4730 Sleep apnea, unspecified: Secondary | ICD-10-CM | POA: Diagnosis not present

## 2021-08-08 DIAGNOSIS — R21 Rash and other nonspecific skin eruption: Secondary | ICD-10-CM | POA: Diagnosis not present

## 2021-08-08 DIAGNOSIS — I129 Hypertensive chronic kidney disease with stage 1 through stage 4 chronic kidney disease, or unspecified chronic kidney disease: Secondary | ICD-10-CM | POA: Diagnosis not present

## 2021-08-08 DIAGNOSIS — D649 Anemia, unspecified: Secondary | ICD-10-CM

## 2021-08-08 DIAGNOSIS — R931 Abnormal findings on diagnostic imaging of heart and coronary circulation: Secondary | ICD-10-CM

## 2021-08-08 DIAGNOSIS — D539 Nutritional anemia, unspecified: Secondary | ICD-10-CM | POA: Diagnosis not present

## 2021-08-08 DIAGNOSIS — D696 Thrombocytopenia, unspecified: Secondary | ICD-10-CM | POA: Diagnosis not present

## 2021-08-08 DIAGNOSIS — R2681 Unsteadiness on feet: Secondary | ICD-10-CM | POA: Diagnosis not present

## 2021-08-08 DIAGNOSIS — I4819 Other persistent atrial fibrillation: Secondary | ICD-10-CM

## 2021-08-08 DIAGNOSIS — R0609 Other forms of dyspnea: Secondary | ICD-10-CM | POA: Diagnosis not present

## 2021-08-08 LAB — RHEUMATOID FACTOR: Rheumatoid fact SerPl-aCnc: <10 IU/mL (ref <14.0)

## 2021-08-08 NOTE — Progress Notes (Signed)
Spoke with patient 08/08/21 @ 11:25 am. [name] [name] over pre-procedure instructions to include the need to arrive at 7:30 for 8:30 procedure, need to be NPO after midnight on night prior to procedure, need to hold Eliquis morning of procedure and need for driver post-procedure. Patient verbalized understanding.

## 2021-08-08 NOTE — Telephone Encounter (Signed)
-----   Message from Owens Shark, NP sent at 08/08/2021 10:03 AM EDT ----- Please let her daughter know the rheumatoid factor is negative.

## 2021-08-08 NOTE — Telephone Encounter (Signed)
Patient's daughter gave verbal understanding and had no further questions or concerns at this time.

## 2021-08-11 DIAGNOSIS — F331 Major depressive disorder, recurrent, moderate: Secondary | ICD-10-CM | POA: Diagnosis not present

## 2021-08-11 DIAGNOSIS — I776 Arteritis, unspecified: Secondary | ICD-10-CM | POA: Diagnosis not present

## 2021-08-11 DIAGNOSIS — D469 Myelodysplastic syndrome, unspecified: Secondary | ICD-10-CM | POA: Diagnosis not present

## 2021-08-11 DIAGNOSIS — I480 Paroxysmal atrial fibrillation: Secondary | ICD-10-CM | POA: Diagnosis not present

## 2021-08-11 DIAGNOSIS — F419 Anxiety disorder, unspecified: Secondary | ICD-10-CM | POA: Diagnosis not present

## 2021-08-12 ENCOUNTER — Telehealth: Payer: Self-pay

## 2021-08-12 DIAGNOSIS — L958 Other vasculitis limited to the skin: Secondary | ICD-10-CM | POA: Diagnosis not present

## 2021-08-12 LAB — ANTINUCLEAR ANTIBODIES, IFA: ANA Ab, IFA: NEGATIVE

## 2021-08-12 NOTE — Telephone Encounter (Signed)
TC from Pt's daughter stating that she wanted to inform Lattie Haw and Dr Benay Spice that her mother has declined since she has seen Lattie Haw at her last appointment. Pt's daughter wanted to inform that her mother can not walk without assistance and her oxygen saturation is at 90 %. Pt's daughter stated she is also schedule to do her Bone Marrow Biopsy tomorrow.Discussed with Dr Benay Spice Pt's daughters  findings. Dr Benay Spice stated Pt should go to the Emergency Room Pt's daughter verbalized understanding.

## 2021-08-12 NOTE — H&P (Signed)
Chief Complaint: Patient was seen in consultation today for progressive pancytopenia at the request of Ned Card K  Referring Physician(s): Owens Shark  Supervising Physician: Juliet Rude  Patient Status: [facility] - Out-pt  History of Present Illness: [name] [name] is a 76 y.o. female with PMH of a fib on Eliquis, anxiety, CKD stage 3, GERD, HLD, HTN and OSA. Pt followed by hematology/oncology for low-grade myelodysplastic syndrome diagnosed by bone marrow biopsy 08/20/2020. Pt presented to oncology recently c/o marked fatigue, diaphoresis, intermittent shortness of breath, loss of appetite, unsteady gait and overall decline in health. Pt was referred to IR by Ned Card, NP for repeat bone marrow biopsy for specific hematologic diagnosis.   Past Medical History:  Diagnosis Date   A-fib ([place])    paroxysmal   Anxiety    Arthritis    Cataracts, bilateral    immature   CKD (chronic kidney disease) stage 3, GFR 30-59 ml/min (HCC)    Depression    GERD (gastroesophageal reflux disease)    was on Nexium    GERD (gastroesophageal reflux disease)    Hemorrhoid    History of bronchitis    couple of yrs ago   History of colon polyps    History of peristent atrial fibrillation    Hyperlipidemia    takes Pravastatin daily   Hypertension    takes Amlodipine and Diovan daily   Hypertension    Pre-diabetes    Sleep apnea    no cpap used   Sleep apnea    study done 20+yrs ago and doesn't use cpap   Urinary urgency    Weakness    in left leg r/t back    Past Surgical History:  Procedure Laterality Date   ABDOMINAL HYSTERECTOMY  1980   partial   ABDOMINAL HYSTERECTOMY     partial   BACK SURGERY     CARDIOVERSION N/A 02/24/2016   Procedure: CARDIOVERSION;  Surgeon: Fay Records, MD;  Location: [facility];  Service: Cardiovascular;  Laterality: N/A;   CARDIOVERSION N/A 03/19/2016   Procedure: CARDIOVERSION;  Surgeon: Fay Records, MD;  Location: [facility] ENDOSCOPY;  Service:  Cardiovascular;  Laterality: N/A;   CARDIOVERSION N/A 05/15/2021   Procedure: CARDIOVERSION;  Surgeon: Pixie Casino, MD;  Location: [facility] ENDOSCOPY;  Service: Cardiovascular;  Laterality: N/A;   [place]     COLONOSCOPY     COLONOSCOPY WITH PROPOFOL  12/29/2011   Procedure: COLONOSCOPY WITH PROPOFOL;  Surgeon: Lear Ng, MD;  Location: [facility] ENDOSCOPY;  Service: Endoscopy;  Laterality: N/A;   COLONOSCOPY WITH PROPOFOL N/A 05/24/2014   Procedure: COLONOSCOPY WITH PROPOFOL;  Surgeon: Wilford Corner, MD;  Location: [facility] ENDOSCOPY;  Service: Endoscopy;  Laterality: N/A;   ESOPHAGEAL MANOMETRY N/A 02/22/2017   Procedure: ESOPHAGEAL MANOMETRY (EM);  Surgeon: Wilford Corner, MD;  Location: [facility] ENDOSCOPY;  Service: Endoscopy;  Laterality: N/A;   ESOPHAGOGASTRODUODENOSCOPY     FOOT SURGERY     left, right   FRACTURE SURGERY     left leg-knee   HEEL SPUR EXCISION Bilateral    HOT HEMOSTASIS  12/29/2011   Procedure: HOT HEMOSTASIS (ARGON PLASMA COAGULATION/BICAP);  Surgeon: Lear Ng, MD;  Location: [facility];  Service: Endoscopy;  Laterality: N/A;   HOT HEMOSTASIS N/A 05/24/2014   Procedure: HOT HEMOSTASIS (ARGON PLASMA COAGULATION/BICAP);  Surgeon: Wilford Corner, MD;  Location: [facility]OS[facility];  Service: Endoscopy;  Laterality: N/A;   leg surgery d/t  break Left    LUMBAR LAMINECTOMY/DECOMPRESSION MICRODISCECTOMY Left 04/05/2013   Procedure: LUMBAR FOUR TO FIVE LUMBAR LAMINECTOMY/DECOMPRESSION MICRODISCECTOMY 1 LEVEL;  Surgeon: Eustace Moore, MD;  Location: [facility]URO ORS;  Service: Neurosurgery;  Laterality: Left;   NISSEN FUNDOPLICATION     [place] IMPEDANCE STUDY N/A 02/22/2017   Procedure: [place] IMPEDANCE STUDY;  Surgeon: Wilford Corner, MD;  Location: [facility] ENDOSCOPY;  Service: Endoscopy;  Laterality: N/A;   TEE WITHOUT CARDIOVERSION N/A 02/24/2016   Procedure: TRANSESOPHAGEAL ECHOCARDIOGRAM (TEE);  Surgeon: Fay Records, MD;   Location: [facility];  Service: Cardiovascular;  Laterality: N/A;   TOTAL KNEE ARTHROPLASTY Left 10/07/2016   TOTAL KNEE ARTHROPLASTY Left 10/07/2016   Procedure: TOTAL KNEE ARTHROPLASTY;  Surgeon: [name], MD;  Location: [facility];  Service: Orthopedics;  Laterality: Left;    Allergies: Omnicef [cefdinir], Bacitracin, Bacitracin-polymyxin b, Duloxetine hcl, Lexapro [escitalopram], Mirtazapine, Penicillins, Peroxide [hydrogen peroxide], Zoloft [sertraline hcl], and Neosporin [neomycin-bacitracin zn-polymyx]  Medications: Prior to Admission medications   Medication Sig Start Date End Date Taking? Authorizing Provider  acetaminophen (TYLENOL) 500 MG tablet Take 1,000 mg by mouth every 6 (six) hours as needed for moderate pain.    [provider]  albuterol (VENTOLIN HFA) 108 (90 Base) MCG/ACT inhaler Inhale 1-2 puffs into the lungs every 4 (four) hours as needed for wheezing or shortness of breath. 07/22/18   Parrett, Fonnie Mu, NP  ALPRAZolam (XANAX) 0.5 MG tablet Take 1 tablet (0.5 mg total) by mouth 3 (three) times daily as needed for anxiety. 03/11/21   Owens Shark, NP  amiodarone (PACERONE) 200 MG tablet Taking one tablet by mouth daily 05/22/21   Fenton, Clint R, PA  apixaban (ELIQUIS) 5 MG TABS tablet Take 1 tablet (5 mg total) by mouth 2 (two) times daily. 03/27/21 03/22/22  Loel Dubonnet, NP  Ascorbic Acid (VITAMIN C WITH ROSE HIPS) 1000 MG tablet Take 1,000 mg by mouth daily.    [provider]  benzonatate (TESSALON) 200 MG capsule Take 1 capsule by mouth three times daily as needed for cough Patient not taking: Reported on 07/22/2021 05/22/19   Parrett, Fonnie Mu, NP  Biotin 5 MG TBDP Take 5 mg by mouth every morning. Patient not taking: Reported on 08/07/2021    [provider]  buPROPion (WELLBUTRIN XL) 150 MG 24 hr tablet Take 150 mg by mouth daily.    [provider]  buPROPion (WELLBUTRIN XL) 300 MG 24 hr tablet Take 300 mg by mouth daily.  08/02/21   [provider]  Calcium Carb-Cholecalciferol (CALCIUM 500 + D PO) Take 1 tablet by mouth daily.    [provider]  Coenzyme Q10 (CO Q-10) 100 MG CAPS Take 100 mg by mouth daily.    [provider]  Cyanocobalamin (B-12) 2000 MCG TABS Take 2,000 mcg by mouth daily.    [provider]  doxycycline (VIBRAMYCIN) 100 MG capsule Take 100 mg by mouth daily.    [provider]  famotidine (PEPCID) 20 MG tablet Take 20 mg by mouth daily as needed for heartburn or indigestion.    [provider]  fexofenadine (ALLEGRA) 180 MG tablet Take 180 mg by mouth daily.    [provider]  furosemide (LASIX) 20 MG tablet TAKE 1 TABLET EVERY DAY 01/29/21   Jerline Pain, MD  levothyroxine (SYNTHROID) 75 MCG tablet TAKE 1 TABLET DAILY BEFORE BREAKFAST (NEED APPOINTMENT FOR FURTHER REFILLS) 06/19/21   Shamleffer, Melanie Crazier, MD  Magnesium 250 MG  TABS Take 250 mg by mouth daily.    [provider]  metoprolol succinate (TOPROL-XL) 25 MG 24 hr tablet Take 1 tablet (25 mg total) by mouth daily. 06/20/21   Jerline Pain, MD  mirtazapine (REMERON) 30 MG tablet Take 1 tablet by mouth at bedtime. 06/13/21   [provider]  potassium chloride SA (KLOR-CON M) 20 MEQ tablet TAKE 1 TABLET EVERY DAY 01/29/21   Jerline Pain, MD  pravastatin (PRAVACHOL) 40 MG tablet Take 40 mg by mouth at bedtime.    [provider]  predniSONE (DELTASONE) 5 MG tablet Take 5-35 mg by mouth See admin instructions. Take 35 mg for 1 day then reduce by 5 mg daily until finished Patient not taking: Reported on 08/07/2021    [provider]  sodium chloride (OCEAN) 0.65 % SOLN nasal spray Place 1 spray into both nostrils as needed for congestion.    [provider]  Spacer/Aero-Holding Chambers (AEROCHAMBER MV) inhaler Use as instructed 10/13/18   Baird Lyons D, MD  Vilazodone HCl (VIIBRYD) 10 MG TABS Take by mouth. 07/17/21   [provider]     Family History  Problem Relation Age of Onset   Colon cancer Mother    Heart disease Father    Breast cancer Neg Hx    Thyroid disease Neg Hx     Social History   Socioeconomic History   Marital status: Married    Spouse name: Dominica Severin   Number of children: 2   Years of education: 12   Highest education level: Some college, no degree  Occupational History   Occupation: retired  Tobacco Use   Smoking status: Never   Smokeless tobacco: Never   Tobacco comments:    Never smoke 05/22/21  Vaping Use   Vaping Use: Never used  Substance and Sexual Activity   Alcohol use: No   Drug use: No   Sexual activity: Yes    Birth control/protection: Surgical  Other Topics Concern   Not on file  Social History Narrative   Right handed   Drinks caffeine   Two story home   Social Determinants of Health   Financial Resource Strain: Low Risk  (11/25/2016)   Overall Financial Resource Strain (CARDIA)    Difficulty of Paying Living Expenses: Not very hard  Food Insecurity: Unknown (11/25/2016)   Hunger Vital Sign    Worried About [place] in the Last Year: Patient refused    [place] in the Last Year: Patient refused  Transportation Needs: Unknown (11/25/2016)   PRAPARE - Transportation    Lack of Transportation (Medical): Patient refused    Lack of Transportation (Non-Medical): Patient refused  Physical Activity: Unknown (11/25/2016)   Exercise Vital Sign    Days of Exercise per Week: Patient refused    Minutes of Exercise per Session: Patient refused  Stress: Stress Concern Present (11/25/2016)   [place]    Feeling of Stress : Very much  Social Connections: Moderately Integrated (11/25/2016)   Social Connection and Isolation Panel [NHANES]    Frequency of Communication with Friends and Family: Twice a week    Frequency of Social Gatherings with Friends and Family: Twice a  week    Attends Religious Services: More than 4 times per year    Active Member of [employer] or Organizations: No    Attends Archivist Meetings: Never    Marital Status: Married  Review of Systems: A 12 point ROS discussed and pertinent positives are indicated in the HPI above.  All other systems are negative.  Review of Systems  Constitutional:  Positive for appetite change and unexpected weight change. Negative for chills and fever.  Eyes:  Negative for visual disturbance.  Respiratory:  Negative for shortness of breath.   Cardiovascular:  Negative for chest pain and leg swelling.  Gastrointestinal:  Negative for abdominal pain, nausea and vomiting.  Neurological:  Positive for light-headedness. Negative for dizziness.  Hematological:  Does not bruise/bleed easily.    Vital Signs: BP (!) 110/52   Pulse 75   Temp 98.5 F (36.9 C) (Oral)   Resp 16   Ht 5' 7"  (1.702 m)   Wt 186 lb (84.4 kg)   SpO2 96%   BMI 29.13 kg/m      Physical Exam Vitals reviewed.  Constitutional:      General: She is not in acute distress.    Appearance: Normal appearance. She is not ill-appearing.  HENT:     Head: Normocephalic and atraumatic.     Mouth/Throat:     Pharynx: Oropharynx is clear.  Eyes:     Extraocular Movements: Extraocular movements intact.     Pupils: Pupils are equal, round, and reactive to light.  Cardiovascular:     Rate and Rhythm: Normal rate and regular rhythm.     Pulses: Normal pulses.     Heart sounds: Normal heart sounds.  Pulmonary:     Effort: Pulmonary effort is normal. No respiratory distress.     Breath sounds: Normal breath sounds.  Abdominal:     General: Bowel sounds are normal. There is no distension.     Palpations: Abdomen is soft.     Tenderness: There is no abdominal tenderness. There is no guarding.  Musculoskeletal:     Right lower leg: No edema.     Left lower leg: No edema.  Skin:    General: Skin is warm and dry.   Neurological:     Mental Status: She is alert and oriented to person, place, and time.  Psychiatric:        Mood and Affect: Mood normal.        Behavior: Behavior normal.        Thought Content: Thought content normal.        Judgment: Judgment normal.     Imaging: No results found.  Labs:  CBC: Recent Labs    06/10/21 0906 06/13/21 1457 07/22/21 0844 08/07/21 1043  WBC 3.0* 3.1* 4.5 2.3*  HGB 11.2* 10.7* 11.1* 9.9*  HCT 36.1 34.3* 35.0* 31.1*  PLT 97* 93* 113* 114*    COAGS: Recent Labs    08/26/20 1358 09/12/20 1007  INR 1.3* 1.2  APTT 34  --     BMP: Recent Labs    04/20/21 1651 04/29/21 0917 05/05/21 1125 06/13/21 0850 08/07/21 1043  NA 138 140 143 139 137  K 4.5 4.3 4.8 4.8 4.8  CL 103 106 109 103 103  CO2 28 26 29 27 27   GLUCOSE 118* 145* 94 97 99  BUN 21 21 15 19 22   CALCIUM 9.7 9.9 9.5 9.4 9.2  CREATININE 1.65* 1.61* 1.56* 1.72* 1.69*  GFRNONAA 32* 33* 34*  --  31*    LIVER FUNCTION TESTS: Recent Labs    04/07/21 1846 04/29/21 0917 06/13/21 0850 08/07/21 1043  BILITOT 0.5 0.5 0.6 0.8  AST 19 13* 28 17  ALT 9 6 12  7  ALKPHOS 51 57 75 48  PROT 7.0 6.5 6.6 5.6*  ALBUMIN 3.7 3.4* 3.6* 3.2*    TUMOR MARKERS: No results for input(s): "AFPTM", "CEA", "CA199", "CHROMGRNA" in the last 8760 hours.  Assessment and Plan: History of a fib on Eliquis, anxiety, CKD stage 3, GERD, HLD, HTN and OSA. Pt followed by hematology/oncology for low-grade myelodysplastic syndrome diagnosed by bone marrow biopsy 08/20/2020. Pt presented to oncology recently c/o marked fatigue, diaphoresis, intermittent shortness of breath, loss of appetite, unsteady gait and overall decline in health. Pt was referred to IR by Ned Card, NP for repeat bone marrow biopsy for specific hematologic diagnosis.   Pt resting on stretcher with husband at bedside.  She is A&O, calm and pleasant.  She is in no distress.  Pt states she is NPO per order.   Risks and benefits of  bone marrow biopsy and aspiration with moderate sedation was discussed with the patient and/or patient's family including, but not limited to bleeding, infection, damage to adjacent structures or low yield requiring additional tests.  All of the questions were answered and there is agreement to proceed.  Consent signed and in chart.   Thank you for this interesting consult.  I greatly enjoyed meeting [name] [name] and look forward to participating in their care.  A copy of this report was sent to the requesting provider on this date.  Electronically Signed: Tyson Alias, NP 08/13/2021, 8:30 AM   I spent a total of 20 minutes in face to face in clinical consultation, greater than 50% of which was counseling/coordinating care for progressive pancytopenia.

## 2021-08-13 ENCOUNTER — Telehealth: Payer: Self-pay

## 2021-08-13 ENCOUNTER — Ambulatory Visit
Admission: RE | Admit: 2021-08-13 | Discharge: 2021-08-13 | Disposition: A | Discharge status: Home / Self Care | Payer: Medicare HMO | Source: Ambulatory Visit | Attending: Nurse Practitioner | Admitting: Nurse Practitioner

## 2021-08-13 ENCOUNTER — Encounter: Payer: Self-pay | Admitting: Radiology

## 2021-08-13 VITALS — BP 106/40 | HR 63 | Temp 98.5°F | Resp 17 | Ht 67.0 in | Wt 186.0 lb

## 2021-08-13 DIAGNOSIS — R0602 Shortness of breath: Secondary | ICD-10-CM | POA: Diagnosis not present

## 2021-08-13 DIAGNOSIS — R630 Anorexia: Secondary | ICD-10-CM | POA: Insufficient documentation

## 2021-08-13 DIAGNOSIS — I4891 Unspecified atrial fibrillation: Secondary | ICD-10-CM | POA: Insufficient documentation

## 2021-08-13 DIAGNOSIS — E785 Hyperlipidemia, unspecified: Secondary | ICD-10-CM | POA: Insufficient documentation

## 2021-08-13 DIAGNOSIS — N183 Chronic kidney disease, stage 3 unspecified: Secondary | ICD-10-CM | POA: Insufficient documentation

## 2021-08-13 DIAGNOSIS — K219 Gastro-esophageal reflux disease without esophagitis: Secondary | ICD-10-CM | POA: Diagnosis not present

## 2021-08-13 DIAGNOSIS — G4733 Obstructive sleep apnea (adult) (pediatric): Secondary | ICD-10-CM | POA: Diagnosis not present

## 2021-08-13 DIAGNOSIS — F411 Generalized anxiety disorder: Secondary | ICD-10-CM | POA: Insufficient documentation

## 2021-08-13 DIAGNOSIS — D61818 Other pancytopenia: Secondary | ICD-10-CM | POA: Insufficient documentation

## 2021-08-13 DIAGNOSIS — Z31438 Encounter for other genetic testing of female for procreative management: Secondary | ICD-10-CM | POA: Insufficient documentation

## 2021-08-13 DIAGNOSIS — D7589 Other specified diseases of blood and blood-forming organs: Secondary | ICD-10-CM | POA: Diagnosis not present

## 2021-08-13 DIAGNOSIS — D649 Anemia, unspecified: Secondary | ICD-10-CM

## 2021-08-13 DIAGNOSIS — I129 Hypertensive chronic kidney disease with stage 1 through stage 4 chronic kidney disease, or unspecified chronic kidney disease: Secondary | ICD-10-CM | POA: Insufficient documentation

## 2021-08-13 DIAGNOSIS — R161 Splenomegaly, not elsewhere classified: Secondary | ICD-10-CM | POA: Insufficient documentation

## 2021-08-13 HISTORY — PX: IR BONE MARROW BIOPSY & ASPIRATION: IMG5727

## 2021-08-13 LAB — CBC WITH DIFFERENTIAL/PLATELET
Abs Immature Granulocytes: 0.02 10*3/uL (ref 0.00–0.07)
Basophils Absolute: 0 10*3/uL (ref 0.0–0.1)
Basophils Relative: 1 %
Eosinophils Absolute: 0 10*3/uL (ref 0.0–0.5)
Eosinophils Relative: 1 %
HCT: 32.4 % — ABNORMAL LOW (ref 36.0–46.0)
Hemoglobin: 9.9 g/dL — ABNORMAL LOW (ref 12.0–15.0)
Immature Granulocytes: 1 %
Lymphocytes Relative: 25 %
Lymphs Abs: 0.7 10*3/uL (ref 0.7–4.0)
MCH: 28.6 pg (ref 26.0–34.0)
MCHC: 30.6 g/dL (ref 30.0–36.0)
MCV: 93.6 fL (ref 80.0–100.0)
Monocytes Absolute: 0.9 10*3/uL (ref 0.1–1.0)
Monocytes Relative: 32 %
Neutro Abs: 1.2 10*3/uL — ABNORMAL LOW (ref 1.7–7.7)
Neutrophils Relative %: 40 %
Platelets: 106 10*3/uL — ABNORMAL LOW (ref 150–400)
RBC: 3.46 MIL/uL — ABNORMAL LOW (ref 3.87–5.11)
RDW: 17.3 % — ABNORMAL HIGH (ref 11.5–15.5)
WBC: 2.9 10*3/uL — ABNORMAL LOW (ref 4.0–10.5)
nRBC: 0 % (ref 0.0–0.2)

## 2021-08-13 MED ORDER — MIDAZOLAM HCL 5 MG/5ML IJ SOLN
INTRAMUSCULAR | Status: AC | PRN
  Administered 2021-08-13: 1 mg via INTRAVENOUS

## 2021-08-13 MED ORDER — FENTANYL CITRATE (PF) 100 MCG/2ML IJ SOLN
INTRAMUSCULAR | Status: AC | PRN
  Administered 2021-08-13 (×2): 50 ug via INTRAVENOUS

## 2021-08-13 MED ORDER — LIDOCAINE 1% INJECTION FOR CIRCUMCISION
INJECTION | INTRAVENOUS | Status: AC | PRN
  Administered 2021-08-13: 10 mL via SUBCUTANEOUS

## 2021-08-13 MED ORDER — MIDAZOLAM HCL 2 MG/2ML IJ SOLN
INTRAMUSCULAR | Status: AC | PRN
  Administered 2021-08-13: 1 mg via INTRAVENOUS

## 2021-08-13 MED ORDER — HEPARIN SOD (PORK) LOCK FLUSH 100 UNIT/ML IV SOLN
INTRAVENOUS | Status: AC
  Filled 2021-08-13: qty 5

## 2021-08-13 MED ORDER — MIDAZOLAM HCL 2 MG/2ML IJ SOLN
INTRAMUSCULAR | Status: AC
  Filled 2021-08-13: qty 2

## 2021-08-13 MED ORDER — FENTANYL CITRATE (PF) 100 MCG/2ML IJ SOLN
INTRAMUSCULAR | Status: AC
  Filled 2021-08-13: qty 2

## 2021-08-13 NOTE — Telephone Encounter (Signed)
-----   Message from Ladell Pier, MD sent at 08/12/2021  8:24 PM EDT ----- Please check on her 8/2. Did she go to the ER?

## 2021-08-13 NOTE — Procedures (Signed)
Interventional Radiology Procedure Note  Date of Procedure: 08/13/2021  Procedure: IR fluoro BMBx  Findings:  1. BMBx right posterior ilium    Complications: No immediate complications noted.   Estimated Blood Loss: minimal  Follow-up and Recommendations: 1. Bedrest 1 hour    Albin Felling, MD  Vascular & Interventional Radiology  08/13/2021 9:29 AM

## 2021-08-13 NOTE — Telephone Encounter (Signed)
Pt did not go to ER. She arrived to her appointment for Bone Marrow Biopsy today.

## 2021-08-13 NOTE — Progress Notes (Signed)
Patient clinically stable post BMB per Dr Denna Haggard, tolerated well. Denies complaints at present time. Vitals stable pre and post procedure. Received Versed 2 mg along with Fentanyl 100 mcg IV for procedure. Report given to [facility] RN post procedure/specials.

## 2021-08-19 ENCOUNTER — Other Ambulatory Visit: Payer: Self-pay | Admitting: Nurse Practitioner

## 2021-08-19 ENCOUNTER — Encounter: Payer: Self-pay | Admitting: *Deleted

## 2021-08-19 DIAGNOSIS — D649 Anemia, unspecified: Secondary | ICD-10-CM

## 2021-08-19 LAB — SURGICAL PATHOLOGY

## 2021-08-19 NOTE — Progress Notes (Signed)
Spoke w/Kelly at [facility] Pathology: tissue has been received from [facility] from bone marrow biopsy of 08/13/21. Results not available yet. She will make Dr. Gari Crown aware that results either released report or verbal to Dr. Benay Spice are requested for 08/22/21 visit.

## 2021-08-20 ENCOUNTER — Encounter: Payer: Self-pay | Admitting: *Deleted

## 2021-08-20 ENCOUNTER — Ambulatory Visit: Payer: Medicare HMO

## 2021-08-20 DIAGNOSIS — R161 Splenomegaly, not elsewhere classified: Secondary | ICD-10-CM

## 2021-08-20 HISTORY — DX: Splenomegaly, not elsewhere classified: R16.1

## 2021-08-21 ENCOUNTER — Encounter (HOSPITAL_COMMUNITY): Payer: Self-pay | Admitting: Oncology

## 2021-08-22 ENCOUNTER — Inpatient Hospital Stay: Payer: Medicare HMO | Attending: Oncology

## 2021-08-22 ENCOUNTER — Inpatient Hospital Stay: Payer: Medicare HMO | Admitting: Nurse Practitioner

## 2021-08-22 ENCOUNTER — Encounter: Payer: Self-pay | Admitting: Nurse Practitioner

## 2021-08-22 VITALS — BP 114/58 | HR 77 | Temp 98.2°F | Resp 20 | Ht 67.0 in | Wt 181.0 lb

## 2021-08-22 DIAGNOSIS — N189 Chronic kidney disease, unspecified: Secondary | ICD-10-CM | POA: Diagnosis not present

## 2021-08-22 DIAGNOSIS — R21 Rash and other nonspecific skin eruption: Secondary | ICD-10-CM | POA: Insufficient documentation

## 2021-08-22 DIAGNOSIS — I4891 Unspecified atrial fibrillation: Secondary | ICD-10-CM | POA: Diagnosis not present

## 2021-08-22 DIAGNOSIS — Z79899 Other long term (current) drug therapy: Secondary | ICD-10-CM | POA: Insufficient documentation

## 2021-08-22 DIAGNOSIS — D689 Coagulation defect, unspecified: Secondary | ICD-10-CM | POA: Diagnosis not present

## 2021-08-22 DIAGNOSIS — G4730 Sleep apnea, unspecified: Secondary | ICD-10-CM | POA: Insufficient documentation

## 2021-08-22 DIAGNOSIS — I2510 Atherosclerotic heart disease of native coronary artery without angina pectoris: Secondary | ICD-10-CM | POA: Insufficient documentation

## 2021-08-22 DIAGNOSIS — D649 Anemia, unspecified: Secondary | ICD-10-CM

## 2021-08-22 DIAGNOSIS — R630 Anorexia: Secondary | ICD-10-CM | POA: Insufficient documentation

## 2021-08-22 DIAGNOSIS — R634 Abnormal weight loss: Secondary | ICD-10-CM | POA: Diagnosis not present

## 2021-08-22 DIAGNOSIS — R61 Generalized hyperhidrosis: Secondary | ICD-10-CM | POA: Insufficient documentation

## 2021-08-22 DIAGNOSIS — C8330 Diffuse large B-cell lymphoma, unspecified site: Secondary | ICD-10-CM | POA: Insufficient documentation

## 2021-08-22 DIAGNOSIS — R2681 Unsteadiness on feet: Secondary | ICD-10-CM | POA: Insufficient documentation

## 2021-08-22 DIAGNOSIS — R0609 Other forms of dyspnea: Secondary | ICD-10-CM | POA: Diagnosis not present

## 2021-08-22 DIAGNOSIS — D539 Nutritional anemia, unspecified: Secondary | ICD-10-CM | POA: Diagnosis not present

## 2021-08-22 DIAGNOSIS — C9000 Multiple myeloma not having achieved remission: Secondary | ICD-10-CM | POA: Diagnosis not present

## 2021-08-22 DIAGNOSIS — I129 Hypertensive chronic kidney disease with stage 1 through stage 4 chronic kidney disease, or unspecified chronic kidney disease: Secondary | ICD-10-CM | POA: Diagnosis not present

## 2021-08-22 DIAGNOSIS — D696 Thrombocytopenia, unspecified: Secondary | ICD-10-CM | POA: Insufficient documentation

## 2021-08-22 LAB — CBC WITH DIFFERENTIAL (CANCER CENTER ONLY)
Abs Immature Granulocytes: 0.02 10*3/uL (ref 0.00–0.07)
Basophils Absolute: 0 10*3/uL (ref 0.0–0.1)
Basophils Relative: 0 %
Eosinophils Absolute: 0 10*3/uL (ref 0.0–0.5)
Eosinophils Relative: 1 %
HCT: 29.1 % — ABNORMAL LOW (ref 36.0–46.0)
Hemoglobin: 8.9 g/dL — ABNORMAL LOW (ref 12.0–15.0)
Immature Granulocytes: 1 %
Lymphocytes Relative: 20 %
Lymphs Abs: 0.5 10*3/uL — ABNORMAL LOW (ref 0.7–4.0)
MCH: 29 pg (ref 26.0–34.0)
MCHC: 30.6 g/dL (ref 30.0–36.0)
MCV: 94.8 fL (ref 80.0–100.0)
Monocytes Absolute: 0.7 10*3/uL (ref 0.1–1.0)
Monocytes Relative: 26 %
Neutro Abs: 1.3 10*3/uL — ABNORMAL LOW (ref 1.7–7.7)
Neutrophils Relative %: 52 %
Platelet Count: 88 10*3/uL — ABNORMAL LOW (ref 150–400)
RBC: 3.07 MIL/uL — ABNORMAL LOW (ref 3.87–5.11)
RDW: 18.5 % — ABNORMAL HIGH (ref 11.5–15.5)
WBC Count: 2.5 10*3/uL — ABNORMAL LOW (ref 4.0–10.5)
nRBC: 0 % (ref 0.0–0.2)

## 2021-08-22 NOTE — Progress Notes (Signed)
  [facility] OFFICE PROGRESS NOTE   Diagnosis: Anemia, thrombocytopenia  INTERVAL HISTORY:   Ms. Toomey returns as scheduled.  She underwent a repeat bone marrow biopsy 08/13/2021.  She continues to have significant fatigue.  Family is helping her with ADLs.  Appetite is poor.  She continues to lose weight.  No fever.  Periodic night sweats.  Objective:  Vital signs in last 24 hours:  Blood pressure (!) 114/58, pulse 77, temperature 98.2 F (36.8 C), temperature source Oral, resp. rate 20, height 5' 7" (1.702 m), weight 181 lb (82.1 kg), SpO2 99 %.    Lymphatics: No palpable cervical, supraclavicular, axillary or inguinal lymph nodes. Resp: Lungs clear bilaterally. Cardio: Regular rate and rhythm. GI: Abdomen is soft.  Fullness with associated tenderness left upper abdomen. Vascular: No leg edema. Skin: Fading erythematous rash over the trunk.   Lab Results:  Lab Results  Component Value Date   WBC 2.5 (L) 08/22/2021   HGB 8.9 (L) 08/22/2021   HCT 29.1 (L) 08/22/2021   MCV 94.8 08/22/2021   PLT 88 (L) 08/22/2021   NEUTROABS 1.3 (L) 08/22/2021    Imaging:  No results found.  Medications: I have reviewed the patient's current medications.  Assessment/Plan: Macrocytic anemia/thrombocytopenia 08/13/2020 B12 3500 08/13/2020 LDH 612 08/13/2020 myeloma panel-no monoclonal protein  Bone marrow biopsy 08/20/2020-hypercellular marrow with erythroid hyperplasia and dyspoiesis, rare lipogranuloma like lesions.  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  Negative myeloma FISH panel, 46XX; neotype myeloid disorders profile NGS sequencing-no pathogenic mutations detected in any of the genes on the NGS panel [facility] screen - 08/26/2020 CTs 02/17/2021-new splenomegaly; diffuse bilateral bronchial wall thickening; clustered groundglass and fine nodular opacity in the dependent right lower lobe.  CAD. Bone marrow biopsy 08/13/2021-hypercellular bone marrow for age  with trilineage hematopoiesis; several atypical lymphoid aggregates present; no increase in blastic cells; flow cytometry without significant T or B-cell abnormalities; normal cytogenetics; "the limited morphologic and immunohistochemical features are atypical and a lymphoproliferative process is not excluded" Fatigue Dyspnea on exertion Unsteady gait/balance disorder, followed by Dr. Tomi Likens CKD Atrial fibrillation Hypertension Sleep apnea Anorexia/weight loss 06/13/2021 skin rash, question vasculitis-course of prednisone initiated; 06/20/2021 marked improvement, rash recurred when she was tapered off of prednisone, prednisone resumed by dermatology    Disposition: Ms. Follansbee has progressive pancytopenia.  Performance status continues to decline.  We reviewed the bone marrow biopsy results.  Dr. Benay Spice has spoken to the pathologist.  There is concern for a lymphoproliferative process.  We are referring her for an urgent PET scan.  This has been scheduled on 08/27/2021.  We will see her in follow-up on 08/28/2021.  Patient seen with Dr. Benay Spice.   Ned Card ANP/GNP-BC   08/22/2021  10:08 AM This was a shared visit with Ned Card.  Ms. Lampkins was interviewed and examined.  We discussed the bone marrow findings with Ms. Iles and her daughter.  I discussed the case with the hematopathologist.  We remain concerned she has a lymphoproliferative disorder based on the elevated LDH, her symptoms, and the bone marrow findings.  She is scheduled for a PET scan and office follow-up next week.  I was present greater than 50% of today's visit.  I performed medical decision making.  [name], MD

## 2021-08-25 ENCOUNTER — Encounter (HOSPITAL_COMMUNITY): Payer: Self-pay | Admitting: Oncology

## 2021-08-27 ENCOUNTER — Encounter (HOSPITAL_COMMUNITY)
Admission: RE | Admit: 2021-08-27 | Discharge: 2021-08-27 | Disposition: A | Discharge status: Home / Self Care | Payer: Medicare HMO | Source: Ambulatory Visit | Attending: Nurse Practitioner | Admitting: Nurse Practitioner

## 2021-08-27 DIAGNOSIS — N179 Acute kidney failure, unspecified: Secondary | ICD-10-CM | POA: Diagnosis present

## 2021-08-27 DIAGNOSIS — F32A Depression, unspecified: Secondary | ICD-10-CM | POA: Diagnosis present

## 2021-08-27 DIAGNOSIS — D649 Anemia, unspecified: Secondary | ICD-10-CM | POA: Diagnosis not present

## 2021-08-27 DIAGNOSIS — D689 Coagulation defect, unspecified: Secondary | ICD-10-CM | POA: Diagnosis present

## 2021-08-27 DIAGNOSIS — Z888 Allergy status to other drugs, medicaments and biological substances status: Secondary | ICD-10-CM | POA: Diagnosis not present

## 2021-08-27 DIAGNOSIS — I898 Other specified noninfective disorders of lymphatic vessels and lymph nodes: Secondary | ICD-10-CM | POA: Diagnosis not present

## 2021-08-27 DIAGNOSIS — Z6827 Body mass index (BMI) 27.0-27.9, adult: Secondary | ICD-10-CM | POA: Diagnosis not present

## 2021-08-27 DIAGNOSIS — C8590 Non-Hodgkin lymphoma, unspecified, unspecified site: Secondary | ICD-10-CM | POA: Diagnosis present

## 2021-08-27 DIAGNOSIS — N189 Chronic kidney disease, unspecified: Secondary | ICD-10-CM | POA: Diagnosis not present

## 2021-08-27 DIAGNOSIS — R627 Adult failure to thrive: Secondary | ICD-10-CM | POA: Diagnosis present

## 2021-08-27 DIAGNOSIS — I5032 Chronic diastolic (congestive) heart failure: Secondary | ICD-10-CM | POA: Diagnosis present

## 2021-08-27 DIAGNOSIS — E039 Hypothyroidism, unspecified: Secondary | ICD-10-CM | POA: Diagnosis present

## 2021-08-27 DIAGNOSIS — Z66 Do not resuscitate: Secondary | ICD-10-CM | POA: Diagnosis present

## 2021-08-27 DIAGNOSIS — D631 Anemia in chronic kidney disease: Secondary | ICD-10-CM | POA: Diagnosis not present

## 2021-08-27 DIAGNOSIS — E785 Hyperlipidemia, unspecified: Secondary | ICD-10-CM | POA: Diagnosis present

## 2021-08-27 DIAGNOSIS — I129 Hypertensive chronic kidney disease with stage 1 through stage 4 chronic kidney disease, or unspecified chronic kidney disease: Secondary | ICD-10-CM | POA: Diagnosis not present

## 2021-08-27 DIAGNOSIS — C8515 Unspecified B-cell lymphoma, lymph nodes of inguinal region and lower limb: Secondary | ICD-10-CM | POA: Diagnosis not present

## 2021-08-27 DIAGNOSIS — C8335 Diffuse large B-cell lymphoma, lymph nodes of inguinal region and lower limb: Secondary | ICD-10-CM | POA: Diagnosis present

## 2021-08-27 DIAGNOSIS — C8331 Diffuse large B-cell lymphoma, lymph nodes of head, face, and neck: Secondary | ICD-10-CM | POA: Diagnosis not present

## 2021-08-27 DIAGNOSIS — I480 Paroxysmal atrial fibrillation: Secondary | ICD-10-CM | POA: Diagnosis present

## 2021-08-27 DIAGNOSIS — E860 Dehydration: Secondary | ICD-10-CM | POA: Diagnosis present

## 2021-08-27 DIAGNOSIS — D539 Nutritional anemia, unspecified: Secondary | ICD-10-CM | POA: Diagnosis present

## 2021-08-27 DIAGNOSIS — R7303 Prediabetes: Secondary | ICD-10-CM | POA: Diagnosis present

## 2021-08-27 DIAGNOSIS — R591 Generalized enlarged lymph nodes: Secondary | ICD-10-CM | POA: Diagnosis not present

## 2021-08-27 DIAGNOSIS — N1831 Chronic kidney disease, stage 3a: Secondary | ICD-10-CM | POA: Diagnosis present

## 2021-08-27 DIAGNOSIS — D61818 Other pancytopenia: Secondary | ICD-10-CM | POA: Diagnosis present

## 2021-08-27 DIAGNOSIS — K219 Gastro-esophageal reflux disease without esophagitis: Secondary | ICD-10-CM | POA: Diagnosis present

## 2021-08-27 DIAGNOSIS — Z881 Allergy status to other antibiotic agents status: Secondary | ICD-10-CM | POA: Diagnosis not present

## 2021-08-27 DIAGNOSIS — F419 Anxiety disorder, unspecified: Secondary | ICD-10-CM | POA: Diagnosis present

## 2021-08-27 DIAGNOSIS — R531 Weakness: Secondary | ICD-10-CM | POA: Diagnosis present

## 2021-08-27 DIAGNOSIS — R590 Localized enlarged lymph nodes: Secondary | ICD-10-CM | POA: Diagnosis not present

## 2021-08-27 DIAGNOSIS — I130 Hypertensive heart and chronic kidney disease with heart failure and stage 1 through stage 4 chronic kidney disease, or unspecified chronic kidney disease: Secondary | ICD-10-CM | POA: Diagnosis present

## 2021-08-27 DIAGNOSIS — R634 Abnormal weight loss: Secondary | ICD-10-CM | POA: Diagnosis present

## 2021-08-27 DIAGNOSIS — Z880 Allergy status to penicillin: Secondary | ICD-10-CM | POA: Diagnosis not present

## 2021-08-27 LAB — GLUCOSE, CAPILLARY: Glucose-Capillary: 99 mg/dL (ref 70–99)

## 2021-08-27 MED ORDER — FLUDEOXYGLUCOSE F - 18 (FDG) INJECTION
8.5000 | Freq: Once | INTRAVENOUS | Status: AC
  Administered 2021-08-27: 9.05 via INTRAVENOUS

## 2021-08-28 ENCOUNTER — Encounter (HOSPITAL_COMMUNITY): Payer: Self-pay

## 2021-08-28 ENCOUNTER — Encounter (HOSPITAL_COMMUNITY): Payer: Self-pay | Admitting: Internal Medicine

## 2021-08-28 ENCOUNTER — Inpatient Hospital Stay (HOSPITAL_COMMUNITY)
Admission: AD | Admit: 2021-08-28 | Discharge: 2021-09-01 | DRG: 824 | Disposition: A | Discharge status: Home / Self Care | Payer: Medicare HMO | Source: Ambulatory Visit | Attending: Internal Medicine | Admitting: Internal Medicine

## 2021-08-28 ENCOUNTER — Inpatient Hospital Stay: Payer: Medicare HMO | Admitting: Nurse Practitioner

## 2021-08-28 ENCOUNTER — Encounter: Payer: Self-pay | Admitting: Nurse Practitioner

## 2021-08-28 VITALS — BP 100/48 | HR 68 | Temp 98.1°F | Resp 18 | Ht 67.0 in | Wt 176.0 lb

## 2021-08-28 DIAGNOSIS — D61818 Other pancytopenia: Secondary | ICD-10-CM | POA: Diagnosis present

## 2021-08-28 DIAGNOSIS — Z66 Do not resuscitate: Secondary | ICD-10-CM | POA: Diagnosis present

## 2021-08-28 DIAGNOSIS — N179 Acute kidney failure, unspecified: Secondary | ICD-10-CM | POA: Diagnosis present

## 2021-08-28 DIAGNOSIS — D689 Coagulation defect, unspecified: Secondary | ICD-10-CM | POA: Diagnosis present

## 2021-08-28 DIAGNOSIS — I480 Paroxysmal atrial fibrillation: Secondary | ICD-10-CM | POA: Diagnosis present

## 2021-08-28 DIAGNOSIS — N189 Chronic kidney disease, unspecified: Secondary | ICD-10-CM | POA: Diagnosis not present

## 2021-08-28 DIAGNOSIS — E039 Hypothyroidism, unspecified: Secondary | ICD-10-CM | POA: Diagnosis present

## 2021-08-28 DIAGNOSIS — F419 Anxiety disorder, unspecified: Secondary | ICD-10-CM | POA: Diagnosis present

## 2021-08-28 DIAGNOSIS — R634 Abnormal weight loss: Secondary | ICD-10-CM | POA: Diagnosis present

## 2021-08-28 DIAGNOSIS — K219 Gastro-esophageal reflux disease without esophagitis: Secondary | ICD-10-CM | POA: Diagnosis present

## 2021-08-28 DIAGNOSIS — D539 Nutritional anemia, unspecified: Secondary | ICD-10-CM | POA: Diagnosis present

## 2021-08-28 DIAGNOSIS — D649 Anemia, unspecified: Secondary | ICD-10-CM

## 2021-08-28 DIAGNOSIS — Z7901 Long term (current) use of anticoagulants: Secondary | ICD-10-CM

## 2021-08-28 DIAGNOSIS — R627 Adult failure to thrive: Secondary | ICD-10-CM | POA: Diagnosis present

## 2021-08-28 DIAGNOSIS — C8590 Non-Hodgkin lymphoma, unspecified, unspecified site: Secondary | ICD-10-CM | POA: Diagnosis present

## 2021-08-28 DIAGNOSIS — I2510 Atherosclerotic heart disease of native coronary artery without angina pectoris: Secondary | ICD-10-CM | POA: Diagnosis present

## 2021-08-28 DIAGNOSIS — Z881 Allergy status to other antibiotic agents status: Secondary | ICD-10-CM | POA: Diagnosis not present

## 2021-08-28 DIAGNOSIS — Z888 Allergy status to other drugs, medicaments and biological substances status: Secondary | ICD-10-CM | POA: Diagnosis not present

## 2021-08-28 DIAGNOSIS — D631 Anemia in chronic kidney disease: Secondary | ICD-10-CM | POA: Diagnosis not present

## 2021-08-28 DIAGNOSIS — E860 Dehydration: Secondary | ICD-10-CM | POA: Diagnosis present

## 2021-08-28 DIAGNOSIS — I4891 Unspecified atrial fibrillation: Secondary | ICD-10-CM

## 2021-08-28 DIAGNOSIS — G4730 Sleep apnea, unspecified: Secondary | ICD-10-CM | POA: Diagnosis present

## 2021-08-28 DIAGNOSIS — N1831 Chronic kidney disease, stage 3a: Secondary | ICD-10-CM | POA: Diagnosis present

## 2021-08-28 DIAGNOSIS — C8335 Diffuse large B-cell lymphoma, lymph nodes of inguinal region and lower limb: Secondary | ICD-10-CM | POA: Diagnosis present

## 2021-08-28 DIAGNOSIS — I5032 Chronic diastolic (congestive) heart failure: Secondary | ICD-10-CM | POA: Diagnosis present

## 2021-08-28 DIAGNOSIS — Z79890 Hormone replacement therapy (postmenopausal): Secondary | ICD-10-CM

## 2021-08-28 DIAGNOSIS — I129 Hypertensive chronic kidney disease with stage 1 through stage 4 chronic kidney disease, or unspecified chronic kidney disease: Secondary | ICD-10-CM | POA: Diagnosis not present

## 2021-08-28 DIAGNOSIS — Z6827 Body mass index (BMI) 27.0-27.9, adult: Secondary | ICD-10-CM

## 2021-08-28 DIAGNOSIS — I10 Essential (primary) hypertension: Secondary | ICD-10-CM | POA: Diagnosis present

## 2021-08-28 DIAGNOSIS — R531 Weakness: Secondary | ICD-10-CM | POA: Diagnosis present

## 2021-08-28 DIAGNOSIS — F32A Depression, unspecified: Secondary | ICD-10-CM | POA: Diagnosis present

## 2021-08-28 DIAGNOSIS — Z880 Allergy status to penicillin: Secondary | ICD-10-CM

## 2021-08-28 DIAGNOSIS — Z8249 Family history of ischemic heart disease and other diseases of the circulatory system: Secondary | ICD-10-CM

## 2021-08-28 DIAGNOSIS — I5030 Unspecified diastolic (congestive) heart failure: Secondary | ICD-10-CM | POA: Diagnosis present

## 2021-08-28 DIAGNOSIS — I130 Hypertensive heart and chronic kidney disease with heart failure and stage 1 through stage 4 chronic kidney disease, or unspecified chronic kidney disease: Secondary | ICD-10-CM | POA: Diagnosis present

## 2021-08-28 DIAGNOSIS — Z79899 Other long term (current) drug therapy: Secondary | ICD-10-CM

## 2021-08-28 DIAGNOSIS — E785 Hyperlipidemia, unspecified: Secondary | ICD-10-CM | POA: Diagnosis present

## 2021-08-28 DIAGNOSIS — R7303 Prediabetes: Secondary | ICD-10-CM | POA: Diagnosis present

## 2021-08-28 DIAGNOSIS — Z96652 Presence of left artificial knee joint: Secondary | ICD-10-CM | POA: Diagnosis present

## 2021-08-28 DIAGNOSIS — R591 Generalized enlarged lymph nodes: Secondary | ICD-10-CM | POA: Diagnosis not present

## 2021-08-28 HISTORY — DX: Weakness: R53.1

## 2021-08-28 MED ORDER — ACETAMINOPHEN 650 MG RE SUPP: 650.0000 mg | Freq: Four times a day (QID) | RECTAL | Status: DC | PRN

## 2021-08-28 MED ORDER — LEVOTHYROXINE SODIUM 75 MCG PO TABS
75.0000 ug | ORAL_TABLET | Freq: Every day | ORAL | Status: DC
  Administered 2021-08-29 – 2021-09-01 (×3): 75 ug via ORAL
  Filled 2021-08-28 (×4): qty 1

## 2021-08-28 MED ORDER — METOPROLOL SUCCINATE ER 25 MG PO TB24
25.0000 mg | ORAL_TABLET | Freq: Every day | ORAL | Status: DC
  Administered 2021-08-28: 25 mg via ORAL
  Filled 2021-08-28 (×2): qty 1

## 2021-08-28 MED ORDER — ONDANSETRON HCL 4 MG PO TABS: 4.0000 mg | ORAL_TABLET | Freq: Four times a day (QID) | ORAL | Status: DC | PRN

## 2021-08-28 MED ORDER — ALBUTEROL SULFATE (2.5 MG/3ML) 0.083% IN NEBU: 2.5000 mg | INHALATION_SOLUTION | RESPIRATORY_TRACT | Status: DC | PRN

## 2021-08-28 MED ORDER — ACETAMINOPHEN 325 MG PO TABS
650.0000 mg | ORAL_TABLET | Freq: Four times a day (QID) | ORAL | Status: DC | PRN
  Administered 2021-08-31: 650 mg via ORAL
  Filled 2021-08-28 (×2): qty 2

## 2021-08-28 MED ORDER — BUPROPION HCL ER (XL) 150 MG PO TB24: 150.0000 mg | ORAL_TABLET | Freq: Every day | ORAL | Status: DC

## 2021-08-28 MED ORDER — AMIODARONE HCL 200 MG PO TABS
200.0000 mg | ORAL_TABLET | Freq: Every day | ORAL | Status: DC
  Administered 2021-08-28 – 2021-09-01 (×2): 200 mg via ORAL
  Filled 2021-08-28 (×2): qty 1

## 2021-08-28 MED ORDER — BISACODYL 5 MG PO TBEC: 5.0000 mg | DELAYED_RELEASE_TABLET | Freq: Every day | ORAL | Status: DC | PRN

## 2021-08-28 MED ORDER — ALBUTEROL SULFATE HFA 108 (90 BASE) MCG/ACT IN AERS: 1.0000 | INHALATION_SPRAY | RESPIRATORY_TRACT | Status: DC | PRN

## 2021-08-28 MED ORDER — PRAVASTATIN SODIUM 40 MG PO TABS
40.0000 mg | ORAL_TABLET | Freq: Every day | ORAL | Status: DC
  Administered 2021-08-28 – 2021-08-31 (×4): 40 mg via ORAL
  Filled 2021-08-28 (×4): qty 1

## 2021-08-28 MED ORDER — ENSURE ENLIVE PO LIQD
237.0000 mL | Freq: Two times a day (BID) | ORAL | Status: DC
Start: 2021-08-29 — End: 2021-08-29

## 2021-08-28 MED ORDER — ALPRAZOLAM 0.5 MG PO TABS
0.5000 mg | ORAL_TABLET | Freq: Three times a day (TID) | ORAL | Status: DC | PRN
  Administered 2021-08-28 – 2021-08-29 (×2): 0.5 mg via ORAL
  Filled 2021-08-28 (×2): qty 1

## 2021-08-28 MED ORDER — ONDANSETRON HCL 4 MG/2ML IJ SOLN: 4.0000 mg | Freq: Four times a day (QID) | INTRAMUSCULAR | Status: DC | PRN

## 2021-08-28 NOTE — H&P (Addendum)
History and Physical    [name] [name] ZYY:[id number] DOB: 07-Mar-1945 DOA: 08/28/2021  I have briefly reviewed the patient's prior medical records in [place]  PCP: [name] Stains, MD  Patient coming from: home via cancer center  Chief Complaint: weakness  HPI: [name] [name] is a 76 y.o. female with medical history significant of paroxysmal A-fib, hypertension, hyperlipidemia, hypothyroidism, who comes into the hospital with complaints of generalized weakness.  Patient has been having increased weakness, poor appetite, nightly chills over the last year and a half.  She also had an associated 40 pound weight loss, and has gotten from being fully independent to urinate after go to now being two-person assist and barely able to ambulate a few steps with a [place].  She eventually saw oncology, and was suspected to have lymphoma.  She underwent a bone marrow biopsy which was suspicion but not necessarily conclusive.  She was seen today in the cancer center, and given progressive weakness, persistent symptoms she was directed to be admitted to the hospital.  She denies any fevers.  No cough or chest congestion, no dysuria.  No nausea, vomiting.  No chest pain, but does have intermittent shortness of breath.  No focal weakness, no numbness or tingling.  Review of Systems: All systems reviewed, and apart from HPI, all negative  Past Medical History:  Diagnosis Date   A-fib ([place])    paroxysmal   Anxiety    Arthritis    Cataracts, bilateral    immature   CKD (chronic kidney disease) stage 3, GFR 30-59 ml/min (HCC)    Depression    GERD (gastroesophageal reflux disease)    was on Nexium    GERD (gastroesophageal reflux disease)    Hemorrhoid    History of bronchitis    couple of yrs ago   History of colon polyps    History of peristent atrial fibrillation    Hyperlipidemia    takes Pravastatin daily   Hypertension    takes Amlodipine and Diovan daily   Hypertension    Pre-diabetes     Sleep apnea    no cpap used   Sleep apnea    study done 20+yrs ago and doesn't use cpap   Urinary urgency    Weakness    in left leg r/t back    Past Surgical History:  Procedure Laterality Date   ABDOMINAL HYSTERECTOMY  1980   partial   ABDOMINAL HYSTERECTOMY     partial   BACK SURGERY     CARDIOVERSION N/A 02/24/2016   Procedure: CARDIOVERSION;  Surgeon: Fay Records, MD;  Location: [place] [facility];  Service: Cardiovascular;  Laterality: N/A;   CARDIOVERSION N/A 03/19/2016   Procedure: CARDIOVERSION;  Surgeon: Fay Records, MD;  Location: [facility] ENDOSCOPY;  Service: Cardiovascular;  Laterality: N/A;   CARDIOVERSION N/A 05/15/2021   Procedure: CARDIOVERSION;  Surgeon: Pixie Casino, MD;  Location: [facility] ENDOSCOPY;  Service: Cardiovascular;  Laterality: N/A;   [place]     COLONOSCOPY     COLONOSCOPY WITH PROPOFOL  12/29/2011   Procedure: COLONOSCOPY WITH PROPOFOL;  Surgeon: Lear Ng, MD;  Location: [facility] ENDOSCOPY;  Service: Endoscopy;  Laterality: N/A;   COLONOSCOPY WITH PROPOFOL N/A 05/24/2014   Procedure: COLONOSCOPY WITH PROPOFOL;  Surgeon: Wilford Corner, MD;  Location: [facility] ENDOSCOPY;  Service: Endoscopy;  Laterality: N/A;   ESOPHAGEAL MANOMETRY N/A 02/22/2017   Procedure: ESOPHAGEAL MANOMETRY (EM);  Surgeon: Michail Sermon,  Evette Doffing, MD;  Location: [facility];  Service: Endoscopy;  Laterality: N/A;   ESOPHAGOGASTRODUODENOSCOPY     FOOT SURGERY     left, right   FRACTURE SURGERY     left leg-knee   HEEL SPUR EXCISION Bilateral    HOT HEMOSTASIS  12/29/2011   Procedure: HOT HEMOSTASIS (ARGON PLASMA COAGULATION/BICAP);  Surgeon: Lear Ng, MD;  Location: [facility] ENDOS[facility];  Service: Endoscopy;  Laterality: N/A;   HOT HEMOSTASIS N/A 05/24/2014   Procedure: HOT HEMOSTASIS (ARGON PLASMA COAGULATION/BICAP);  Surgeon: Wilford Corner, MD;  Location: [facility] ENDOSCOPY;  Service: Endoscopy;  Laterality: N/A;   IR BONE MARROW  BIOPSY & ASPIRATION  08/13/2021   leg surgery d/t break Left    LUMBAR LAMINECTOMY/DECOMPRESSION MICRODISCECTOMY Left 04/05/2013   Procedure: LUMBAR FOUR TO FIVE LUMBAR LAMINECTOMY/DECOMPRESSION MICRODISCECTOMY 1 LEVEL;  Surgeon: Eustace Moore, MD;  Location: [facility] NEURO ORS;  Service: Neurosurgery;  Laterality: Left;   NISSEN FUNDOPLICATION     [place] IMPEDANCE STUDY N/A 02/22/2017   Procedure: [place] IMPEDANCE STUDY;  Surgeon: Wilford Corner, MD;  Location: [facility] ENDOSCOPY;  Service: Endoscopy;  Laterality: N/A;   TEE WITHOUT CARDIOVERSION N/A 02/24/2016   Procedure: TRANSESOPHAGEAL ECHOCARDIOGRAM (TEE);  Surgeon: Fay Records, MD;  Location: [place];  Service: Cardiovascular;  Laterality: N/A;   TOTAL KNEE ARTHROPLASTY Left 10/07/2016   TOTAL KNEE ARTHROPLASTY Left 10/07/2016   Procedure: TOTAL KNEE ARTHROPLASTY;  Surgeon: [name], MD;  Location: [facility];  Service: Orthopedics;  Laterality: Left;     reports that she has never smoked. She has never used smokeless tobacco. She reports that she does not drink alcohol and does not use drugs.  Allergies  Allergen Reactions   Omnicef [Cefdinir] Swelling    Tongue swelling   Bacitracin     Rash   Bacitracin-Polymyxin B     Rash   Duloxetine Hcl     felt funny- thinking messed up   Lexapro [Escitalopram]     Makes her feel funny and sleepy   Mirtazapine     weight gain in high doses    Penicillins Swelling    Tongue swell   Peroxide [Hydrogen Peroxide] Other (See Comments)    Redness.    Zoloft [Sertraline Hcl]     Makes her feel funny   Neosporin [Neomycin-Bacitracin Zn-Polymyx] Rash    Blisters, itching.     Family History  Problem Relation Age of Onset   Colon cancer Mother    Heart disease Father    Breast cancer Neg Hx    Thyroid disease Neg Hx     Prior to Admission medications   Medication Sig Start Date End Date Taking? Authorizing Provider  acetaminophen (TYLENOL) 500 MG tablet Take 1,000 mg by mouth every 6 (six)  hours as needed for moderate pain.    [provider]  albuterol (VENTOLIN HFA) 108 (90 Base) MCG/ACT inhaler Inhale 1-2 puffs into the lungs every 4 (four) hours as needed for wheezing or shortness of breath. 07/22/18   Parrett, Fonnie Mu, NP  ALPRAZolam (XANAX) 0.5 MG tablet Take 1 tablet (0.5 mg total) by mouth 3 (three) times daily as needed for anxiety. 03/11/21   Owens Shark, NP  amiodarone (PACERONE) 200 MG tablet Taking one tablet by mouth daily 05/22/21   Fenton, Clint R, PA  apixaban (ELIQUIS) 5 MG TABS tablet Take 1 tablet (5 mg total) by mouth 2 (two) times daily. 03/27/21 03/22/22  Loel Dubonnet, NP  Ascorbic Acid (VITAMIN C  WITH ROSE HIPS) 1000 MG tablet Take 1,000 mg by mouth daily.    [provider]  benzonatate (TESSALON) 200 MG capsule Take 1 capsule by mouth three times daily as needed for cough Patient not taking: Reported on 07/22/2021 05/22/19   Parrett, Fonnie Mu, NP  Biotin 5 MG TBDP Take 5 mg by mouth every morning. Patient not taking: Reported on 08/07/2021    [provider]  buPROPion (WELLBUTRIN XL) 150 MG 24 hr tablet Take 150 mg by mouth daily.    [provider]  buPROPion (WELLBUTRIN XL) 300 MG 24 hr tablet Take 300 mg by mouth daily. 08/02/21   [provider]  Calcium Carb-Cholecalciferol (CALCIUM 500 + D PO) Take 1 tablet by mouth daily.    [provider]  Coenzyme Q10 (CO Q-10) 100 MG CAPS Take 100 mg by mouth daily.    [provider]  Cyanocobalamin (B-12) 2000 MCG TABS Take 2,000 mcg by mouth daily.    [provider]  doxycycline (VIBRAMYCIN) 100 MG capsule Take 100 mg by mouth daily.    [provider]  famotidine (PEPCID) 20 MG tablet Take 20 mg by mouth daily as needed for heartburn or indigestion.    [provider]  fexofenadine (ALLEGRA) 180 MG tablet Take 180 mg by mouth daily.    [provider]  furosemide (LASIX) 20 MG tablet TAKE 1 TABLET EVERY DAY  01/29/21   Jerline Pain, MD  levothyroxine (SYNTHROID) 75 MCG tablet TAKE 1 TABLET DAILY BEFORE BREAKFAST (NEED APPOINTMENT FOR FURTHER REFILLS) 06/19/21   Shamleffer, Melanie Crazier, MD  Magnesium 250 MG TABS Take 250 mg by mouth daily.    [provider]  metoprolol succinate (TOPROL-XL) 25 MG 24 hr tablet Take 1 tablet (25 mg total) by mouth daily. 06/20/21   Jerline Pain, MD  mirtazapine (REMERON) 30 MG tablet Take 1 tablet by mouth at bedtime. 06/13/21   [provider]  potassium chloride SA (KLOR-CON M) 20 MEQ tablet TAKE 1 TABLET EVERY DAY 01/29/21   Jerline Pain, MD  pravastatin (PRAVACHOL) 40 MG tablet Take 40 mg by mouth at bedtime.    [provider]  predniSONE (DELTASONE) 5 MG tablet Take 5-35 mg by mouth See admin instructions. Take 35 mg for 1 day then reduce by 5 mg daily until finished Patient not taking: Reported on 08/07/2021    [provider]  sodium chloride (OCEAN) 0.65 % SOLN nasal spray Place 1 spray into both nostrils as needed for congestion.    [provider]  Spacer/Aero-Holding Chambers (AEROCHAMBER MV) inhaler Use as instructed 10/13/18   Baird Lyons D, MD  Vilazodone HCl (VIIBRYD) 10 MG TABS Take by mouth. 07/17/21   [provider]    Physical Exam: There were no vitals filed for this visit.  Constitutional: NAD, calm, comfortable, pale appearing Eyes: PERRL, lids and conjunctivae normal ENMT: Mucous membranes are dry Neck: normal, supple Respiratory: clear to auscultation bilaterally, no wheezing, no crackles. Normal respiratory effort. No accessory muscle use.  Cardiovascular: Regular rate and rhythm, no murmurs / rubs / gallops. No extremity edema. Abdomen: no tenderness, no masses palpated. Bowel sounds positive.  Musculoskeletal: no clubbing / cyanosis. Normal muscle tone.  Skin: no rashes, lesions, ulcers. No induration Neurologic: Grossly nonfocal.   Labs on Admission: I have personally reviewed  following labs and imaging studies  CBC: Recent Labs  Lab 08/22/21 0848  WBC 2.5*  NEUTROABS 1.3*  HGB 8.9*  HCT 29.1*  MCV 94.8  PLT 88*   Basic Metabolic Panel: No results for input(s): "NA", "K", "CL", "CO2", "GLUCOSE", "BUN", "CREATININE", "CALCIUM", "MG", "PHOS" in the last 168 hours. Liver Function Tests: No results for input(s): "AST", "ALT", "ALKPHOS", "BILITOT", "PROT", "ALBUMIN" in the last 168 hours. Coagulation Profile: No results for input(s): "INR", "PROTIME" in the last 168 hours. BNP (last 3 results) No results for input(s): "PROBNP" in the last 8760 hours. CBG: Recent Labs  Lab 08/27/21 1436  GLUCAP 99   Thyroid Function Tests: No results for input(s): "TSH", "T4TOTAL", "FREET4", "T3FREE", "THYROIDAB" in the last 72 hours. Urine analysis:    Component Value Date/Time   COLORURINE YELLOW 09/25/2016 [place] 09/25/2016 1148   LABSPEC 1.013 09/25/2016 1148   PHURINE 5.0 09/25/2016 1148   GLUCOSEU NEGATIVE 09/25/2016 1148   HGBUR NEGATIVE 09/25/2016 [place] 09/25/2016 1148   KETONESUR NEGATIVE 09/25/2016 1148   PROTEINUR NEGATIVE 09/25/2016 1148   NITRITE NEGATIVE 09/25/2016 1148   LEUKOCYTESUR TRACE (A) 09/25/2016 1148     Radiological Exams on Admission: No results found.  Assessment/Plan  Principal problem Concern for lymphoma-patient will be admitted to the hospital, general surgery consulted to evaluate patient for lymph node biopsy.  Will make n.p.o. after midnight and hold her home Eliquis in preparation for biopsy, hopefully they can do tomorrow.  Oncology to see in the morning, considering steroids after biopsy is done  Active problems Paroxysmal A-fib-we will obtain an EKG for baseline, but currently appears in sinus rhythm.  Resume home amiodarone, metoprolol, Eliquis on hold as above  Essential hypertension-resume home medications  Hypothyroidism-continue Synthroid.  Recheck a TSH since she is  lost significant amount of weight  Hyperlipidemia-resume statin  Pancytopenia-likely due to underlying lymphoma.  Monitor counts  Generalized weakness, weight loss, FTT-significant amount of weight loss in the last year and a half, now extremely weak and deconditioned requiring 2+ person assist.  Consult physical therapy, may need SNF.  Anxiety-resume home Wellbutrin, she takes 150 mg daily, continue Xanax as needed  DVT prophylaxis: SCDs  Code Status: DNR  Family Communication: Daughter and husband at bedside Disposition Plan: Home versus SNF Bed Type: MedSurg with cardiac monitoring Consults called: General surgery, oncology Obs/Inp: Inpatient  At the time of admission, it appears that the appropriate admission status for this patient is INPATIENT as it is expected that patient will require hospital care > 2 midnights. This is judged to be reasonable and necessary in order to provide the required intensity of service to ensure the patient's safety given: presenting symptoms, initial radiographic and laboratory data and in the context of their chronic comorbidities. Together, these circumstances are felt to place patient at high at high risk for further clinical deterioration threatening life, limb, or organ.  Marzetta Board, MD, PhD [facility]  Contact via [email or web address]  08/28/2021, 5:44 PM

## 2021-08-28 NOTE — Progress Notes (Signed)
[facility] [facility] OFFICE PROGRESS NOTE   Diagnosis: Anemia, thrombocytopenia  INTERVAL HISTORY:   Ms. Leckrone returns as scheduled.  She continues to not feel well.  Intermittent chills.  No fever.  Periodic night sweats.  Energy level is poor.  She continues to lose weight.  Appetite is poor.  No nausea or vomiting.  No bowel or bladder problems.  Objective:  Vital signs in last 24 hours:  Blood pressure (!) 100/48, pulse 68, temperature 98.1 F (36.7 C), temperature source Oral, resp. rate (!) 98, height _0  (1.702 m), weight 176 lb (79.8 kg), SpO2 98 %.    Lymphatics: No palpable cervical, supraclavicular, axillary or inguinal lymph nodes. Resp: Lungs clear bilaterally. Cardio: Regular rate and rhythm. GI: Tender left upper abdomen, associated fullness. Vascular: No leg edema.   Lab Results:  Lab Results  Component Value Date   WBC 2.5 (L) 08/22/2021   HGB 8.9 (L) 08/22/2021   HCT 29.1 (L) 08/22/2021   MCV 94.8 08/22/2021   PLT 88 (L) 08/22/2021   NEUTROABS 1.3 (L) 08/22/2021    Imaging:  No results found.  Medications: I have reviewed the patient's current medications.  Assessment/Plan: Macrocytic anemia/thrombocytopenia 08/13/2020 B12 3500 08/13/2020 LDH 612 08/13/2020 myeloma panel-no monoclonal protein  Bone marrow biopsy 08/20/2020-hypercellular marrow with erythroid hyperplasia and dyspoiesis, rare lipogranuloma like lesions.  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  Negative myeloma FISH panel, 46XX; neotype myeloid disorders profile NGS sequencing-no pathogenic mutations detected in any of the genes on the NGS panel [facility] screen - 08/26/2020 CTs 02/17/2021-new splenomegaly; diffuse bilateral bronchial wall thickening; clustered groundglass and fine nodular opacity in the dependent right lower lobe.  CAD. Bone marrow biopsy 08/13/2021-hypercellular bone marrow for age with trilineage hematopoiesis; several atypical lymphoid aggregates  present; no increase in blastic cells; flow cytometry without significant T or B-cell abnormalities; normal cytogenetics; "the limited morphologic and immunohistochemical features are atypical and a lymphoproliferative process is not excluded" PET scan 08/27/2021-final pending Fatigue Dyspnea on exertion Unsteady gait/balance disorder, followed by Dr. Tomi Likens CKD Atrial fibrillation Hypertension Sleep apnea Anorexia/weight loss 06/13/2021 skin rash, question vasculitis-course of prednisone initiated; 06/20/2021 marked improvement, rash recurred when she was tapered off of prednisone, prednisone resumed by dermatology    Disposition: Ms. Liszewski condition continues to decline.  We did a preliminary review of the PET scan.  She appears to have hypermetabolic lymph nodes in multiple areas.  We discussed the possibility of lymphoma.  She needs an excisional biopsy.  We are making arrangements for hospital admission to expedite the process.  She and her daughter are in agreement with this plan.  Patient seen with Dr. Benay Spice.  Ned Card ANP/GNP-BC   08/28/2021  11:57 AM This was a shared visit with Ned Card.  Ms. Baum was interviewed and examined.  She continues to have anorexia/weight loss, malaise, and sweats.  I reviewed the PET images with Ms. Dragone's daughter.  There are areas of hypermetabolism involving multiple lymph nodes in the chest, retroperitoneum, and pelvis.  There also appear to be hypermetabolic bone lesions.  We are waiting on the final PET report.  The most likely diagnosis is lymphoma.  She has severe symptoms and has lost a significant amount of weight over the past year.  She appears uncomfortable today.  I recommend hospital admission for an expedited lymph node biopsy and initiation of systemic therapy if a lymphoma diagnosis is confirmed.  I contacted Dr. Marylyn Ishihara and he graciously excepted her for admission.  We will consult the surgical service to consider biopsy of an  inguinal or axillary lymph node.  We will consider starting a course of palliative prednisone after the biopsy is performed.  I will check on her 08/29/2021.  I was present for greater than 50% of today's visit.  I performed medical decision making.  [name], MD

## 2021-08-29 DIAGNOSIS — R531 Weakness: Secondary | ICD-10-CM

## 2021-08-29 DIAGNOSIS — D61818 Other pancytopenia: Secondary | ICD-10-CM

## 2021-08-29 HISTORY — DX: Other pancytopenia: D61.818

## 2021-08-29 LAB — CBC
HCT: 23.5 % — ABNORMAL LOW (ref 36.0–46.0)
Hemoglobin: 7.2 g/dL — ABNORMAL LOW (ref 12.0–15.0)
MCH: 29.6 pg (ref 26.0–34.0)
MCHC: 30.6 g/dL (ref 30.0–36.0)
MCV: 96.7 fL (ref 80.0–100.0)
Platelets: 70 10*3/uL — ABNORMAL LOW (ref 150–400)
RBC: 2.43 MIL/uL — ABNORMAL LOW (ref 3.87–5.11)
RDW: 18.8 % — ABNORMAL HIGH (ref 11.5–15.5)
WBC: 2.2 10*3/uL — ABNORMAL LOW (ref 4.0–10.5)
nRBC: 0 % (ref 0.0–0.2)

## 2021-08-29 LAB — COMPREHENSIVE METABOLIC PANEL
ALT: 9 U/L (ref 0–44)
AST: 18 U/L (ref 15–41)
Albumin: 2.2 g/dL — ABNORMAL LOW (ref 3.5–5.0)
Alkaline Phosphatase: 42 U/L (ref 38–126)
Anion gap: 7 (ref 5–15)
BUN: 41 mg/dL — ABNORMAL HIGH (ref 8–23)
CO2: 22 mmol/L (ref 22–32)
Calcium: 8.2 mg/dL — ABNORMAL LOW (ref 8.9–10.3)
Chloride: 111 mmol/L (ref 98–111)
Creatinine, Ser: 2.56 mg/dL — ABNORMAL HIGH (ref 0.44–1.00)
GFR, Estimated: 19 mL/min — ABNORMAL LOW (ref >60)
Glucose, Bld: 87 mg/dL (ref 70–99)
Potassium: 4.4 mmol/L (ref 3.5–5.1)
Sodium: 140 mmol/L (ref 135–145)
Total Bilirubin: 0.9 mg/dL (ref 0.3–1.2)
Total Protein: 4.6 g/dL — ABNORMAL LOW (ref 6.5–8.1)

## 2021-08-29 LAB — DIC (DISSEMINATED INTRAVASCULAR COAGULATION)PANEL
D-Dimer, Quant: <0.27 ug/mL-FEU (ref 0.00–0.50)
Fibrinogen: 346 mg/dL (ref 210–475)
INR: 2.3 — ABNORMAL HIGH (ref 0.8–1.2)
Platelets: 70 10*3/uL — ABNORMAL LOW (ref 150–400)
Prothrombin Time: 25 seconds — ABNORMAL HIGH (ref 11.4–15.2)
Smear Review: NONE SEEN
aPTT: 49 seconds — ABNORMAL HIGH (ref 24–36)

## 2021-08-29 LAB — URIC ACID: Uric Acid, Serum: 9 mg/dL — ABNORMAL HIGH (ref 2.5–7.1)

## 2021-08-29 LAB — RETICULOCYTES
Immature Retic Fract: 19.7 % — ABNORMAL HIGH (ref 2.3–15.9)
RBC.: 2.57 MIL/uL — ABNORMAL LOW (ref 3.87–5.11)
Retic Count, Absolute: 118 10*3/uL (ref 19.0–186.0)
Retic Ct Pct: 4.6 % — ABNORMAL HIGH (ref 0.4–3.1)

## 2021-08-29 LAB — LACTATE DEHYDROGENASE: LDH: 993 U/L — ABNORMAL HIGH (ref 98–192)

## 2021-08-29 LAB — GLUCOSE, CAPILLARY: Glucose-Capillary: 109 mg/dL — ABNORMAL HIGH (ref 70–99)

## 2021-08-29 LAB — PROTIME-INR
INR: 2.4 — ABNORMAL HIGH (ref 0.8–1.2)
Prothrombin Time: 25.8 seconds — ABNORMAL HIGH (ref 11.4–15.2)

## 2021-08-29 LAB — TSH: TSH: 0.214 u[IU]/mL — ABNORMAL LOW (ref 0.350–4.500)

## 2021-08-29 MED ORDER — SODIUM CHLORIDE 0.9 % IV SOLN: INTRAVENOUS | Status: DC

## 2021-08-29 MED ORDER — ADULT MULTIVITAMIN W/MINERALS CH
1.0000 | ORAL_TABLET | Freq: Every day | ORAL | Status: DC
  Administered 2021-08-31 – 2021-09-01 (×2): 1 via ORAL
  Filled 2021-08-29 (×2): qty 1

## 2021-08-29 MED ORDER — DEXTROSE 5 % IV SOLN
5.0000 mg | Freq: Once | INTRAVENOUS | Status: AC
  Administered 2021-08-29: 5 mg via INTRAVENOUS
  Filled 2021-08-29: qty 0.5

## 2021-08-29 MED ORDER — ENSURE ENLIVE PO LIQD
237.0000 mL | Freq: Three times a day (TID) | ORAL | Status: DC
  Administered 2021-08-29 – 2021-08-31 (×5): 237 mL via ORAL

## 2021-08-29 NOTE — Progress Notes (Addendum)
HEMATOLOGY-ONCOLOGY PROGRESS NOTE  ASSESSMENT AND PLAN: Macrocytic anemia/thrombocytopenia 08/13/2020 B12 3500 08/13/2020 LDH 612 08/13/2020 myeloma panel-no monoclonal protein  Bone marrow biopsy 08/20/2020-hypercellular marrow with erythroid hyperplasia and dyspoiesis, rare lipogranuloma like lesions.  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  Negative myeloma FISH panel, 46XX; neotype myeloid disorders profile NGS sequencing-no pathogenic mutations detected in any of the genes on the NGS panel [facility] screen - 08/26/2020 CTs 02/17/2021-new splenomegaly; diffuse bilateral bronchial wall thickening; clustered groundglass and fine nodular opacity in the dependent right lower lobe.  CAD. Bone marrow biopsy 08/13/2021-hypercellular bone marrow for age with trilineage hematopoiesis; several atypical lymphoid aggregates present; no increase in blastic cells; flow cytometry without significant T or B-cell abnormalities; normal cytogenetics; "the limited morphologic and immunohistochemical features are atypical and a lymphoproliferative process is not excluded" PET scan 9/60/4540-[id number] hypermetabolic lymphadenopathy in the neck, left axilla, mediastinum, retroperitoneum, and left pelvis.  Hypermetabolic skeletal lesions, generalized bilateral pulmonary hypermetabolism Fatigue Dyspnea on exertion Unsteady gait/balance disorder, followed by Dr. Tomi Likens CKD Atrial fibrillation Hypertension Sleep apnea Anorexia/weight loss 06/13/2021 skin rash, question vasculitis-course of prednisone initiated; 06/20/2021 marked improvement, rash recurred when she was tapered off of prednisone, prednisone resumed by dermatology Coagulopathy-most likely related to malnutrition and apixaban  [name] [name] had a PET scan performed recently which showed multiple hypermetabolic areas in the lymph nodes.  This is suspicious for lymphoma.  PET scan not yet read by radiology as of the time of dictation.  The probable diagnosis  has been discussed with the patient and her family.  Neurosurgery has seen the patient for consideration of excisional lymph node biopsy.  INR elevated today and therefore we will hold off on biopsy until INR is less than 1.8.  We will consider starting her on prednisone once biopsy has been obtained.  The elevated INR may be related to recent Eliquis use and poor nutritional status.  Her CBC from this morning has been reviewed and she continues to have pancytopenia.  Hemoglobin is 7.2 this morning.  Recommend PRBC transfusion for symptomatic anemia.  Recommendations: 1.  Await excisional lymph node biopsy by general surgery.  We will discuss diagnosis and treatment options pending results. 2.  Monitor INR closely.  Continue to hold Eliquis, vitamin K, check PT/INR 08/30/2021 3.  Begin prednisone-60 mg p.o. daily once a lymph node biopsy is obtained 4.  Transfuse for symptomatic anemia. 5.  Recommend dietitian consult.  [name]  [name] [name] was interviewed and examined.  She is known to me with a history of pancytopenia, rash, anorexia/weight loss, and failure to thrive.  Her symptoms have progressed over the past year.  She has undergone an extensive diagnostic evaluation of the past year including CT imaging and repeat bone marrow biopsies.  She has developed progressive failure to thrive over the past several weeks prompting hospital admission yesterday.  The PET scan is consistent with diffuse hypermetabolic lymphadenopathy and hypermetabolic bone lesions.  I suspect she has lymphoma.  I recommended diagnostic biopsy and initiation of systemic therapy as soon as the pathology is available.  I recommend beginning prednisone as a palliative measure while awaiting the biopsy result.  Hopefully the PT/INR will correct with the apixaban on hold.  We will give vitamin K.  Please call oncology as needed over the weekend.  I will check on her 09/01/2021.  I appreciate the care from the medical  and surgical services.  I was present for greater than 50% of today's visit.  I performed medical decision making.  [name], MD  SUBJECTIVE: Admitted to the hospital from our office yesterday to expedite work-up for possible lymphoma.  Was seen by general surgery just prior to my visit for consideration of excisional lymph node biopsy.  INR elevated today and general surgery will consider biopsy once INR is below 1.8.  Last dose of Eliquis this past Wednesday.  PHYSICAL EXAMINATION:  Vitals:   08/28/21 2215 08/29/21 0431  BP: (!) 99/43 (!) 108/40  Pulse: 79 73  Resp: 20 16  Temp: 100.3 F (37.9 C) 98.2 F (36.8 C)  SpO2: 93% 92%   There were no vitals filed for this visit.  Intake/Output from previous day: No intake/output data recorded.  Physical Exam Vitals reviewed.  Constitutional:      General: She is not in acute distress. Eyes:     General: No scleral icterus.    Conjunctiva/sclera: Conjunctivae normal.  Cardiovascular:     Rate and Rhythm: Normal rate.  Abdominal:     Palpations: Abdomen is soft.  Skin:    General: Skin is warm and dry.  Neurological:     Mental Status: She is alert and oriented to person, place, and time.    LABORATORY DATA:  I have reviewed the data as listed    Latest Ref Rng & Units 08/29/2021    5:40 AM 08/07/2021   10:43 AM 06/13/2021    8:50 AM  CMP  Glucose 70 - 99 mg/dL 87  99  97   BUN 8 - 23 mg/dL 41  22  19   Creatinine 0.44 - 1.00 mg/dL 2.56  1.69  1.72   Sodium 135 - 145 mmol/L 140  137  139   Potassium 3.5 - 5.1 mmol/L 4.4  4.8  4.8   Chloride 98 - 111 mmol/L 111  103  103   CO2 22 - 32 mmol/L _0 Calcium 8.9 - 10.3 mg/dL 8.2  9.2  9.4   Total Protein 6.5 - 8.1 g/dL 4.6  5.6  6.6   Total Bilirubin 0.3 - 1.2 mg/dL 0.9  0.8  0.6   Alkaline Phos 38 - 126 U/L 42  48  75   AST 15 - 41 U/L _1 ALT 0 - 44 U/L _2 Lab Results  Component Value Date   WBC 2.2 (L) 08/29/2021   HGB 7.2 (L)  08/29/2021   HCT 23.5 (L) 08/29/2021   MCV 96.7 08/29/2021   PLT 70 (L) 08/29/2021   NEUTROABS 1.3 (L) 08/22/2021    No results found for: "CEA1", "CEA", "CAN199", "CA125", "PSA1"  IR BONE MARROW BIOPSY & ASPIRATION  Result Date: 08/13/2021 INDICATION: pancytopenia, sweats, splenomegaly, anorexia EXAM: Bone marrow aspiration and core biopsy using fluoroscopic guidance MEDICATIONS: None. ANESTHESIA/SEDATION: Moderate (conscious) sedation was employed during this procedure. A total of Versed 2 mg and Fentanyl 100 mcg was administered intravenously. Moderate Sedation Time: 10 minutes. The patient's level of consciousness and vital signs were monitored continuously by radiology nursing throughout the procedure under my direct supervision. FLUOROSCOPY TIME:  Fluoroscopy Time: 1.8 minutes (18 mGy) COMPLICATIONS: None immediate. PROCEDURE: Informed written consent was obtained from the patient after a thorough discussion of the procedural risks, benefits and alternatives. All questions were addressed. Maximal Sterile Barrier Technique was utilized including caps, mask, sterile gowns, sterile gloves, sterile drape, hand hygiene and skin antiseptic. A timeout was performed prior to the initiation of the  procedure. The patient was placed prone on the IR exam table. Limited fluoroscopy of the pelvis was performed for planning purposes. Skin entry site was marked, and the overlying skin was prepped and draped in the standard sterile fashion. Local analgesia was obtained with 1% lidocaine. Using fluoroscopic guidance, an 11 gauge needle was advanced just deep to the cortex of the right posterior ilium. Subsequently, bone marrow aspiration and core biopsy were performed. Specimens were submitted to lab/pathology for handling. Hemostasis was achieved with manual pressure, and a clean dressing was placed. The patient tolerated the procedure well without immediate complication. IMPRESSION: Successful bone marrow aspiration  and core biopsy of the right posterior ilium using fluoroscopic guidance. Electronically Signed   By: Albin Felling M.D.   On: 08/13/2021 14:00     Future Appointments  Date Time Provider [place]  09/04/2021  9:15 AM [facility] PHLEBOTOMIST [facility] None  09/04/2021  9:45 AM Owens Shark, NP [facility] None  09/22/2021 12:00 PM [facility]  10/09/2021  9:00 AM Jerline Pain, MD [facility]      LOS: 1 day

## 2021-08-29 NOTE — Progress Notes (Signed)
Initial Nutrition Assessment  DOCUMENTATION CODES:   Obesity unspecified  INTERVENTION:   Ensure Enlive po TID, each supplement provides 350 kcal and 20 grams of protein.  MVI po daily   Pt at high refeed risk; recommend monitor potassium, magnesium and phosphorus labs daily until stable  NUTRITION DIAGNOSIS:   Unintentional weight loss related to chronic illness (suspected lymphoma) as evidenced by 21 percent weight loss in 10 months.  GOAL:   Patient will meet greater than or equal to 90% of their needs  MONITOR:   PO intake, Supplement acceptance, Labs, Weight trends, Skin, I & O's  REASON FOR ASSESSMENT:   Consult Assessment of nutrition requirement/status  ASSESSMENT:   76 y/o female with h/o HLD, HTN, GERD s/p nissen, CHF, Afib, OSA, hypothyroidism, MDD, anxiety and CKD III who is admitted with wt loss, weakness and possible lymphoma.  RD working remotely.  Spoke with pt via phone. Pt reports poor appetite and oral intake for the past several months. Pt reports that sometimes when she eats certain foods, they just get bigger in her mouth and then she cant swallow them. Pt reports that she has lost over 60lbs in the past year. Pt reports that she has lost 8lbs over the past several weeks. Per chart, pt is down 47lbs(21%) over the past 10 months; this is significant weight loss. Pt reports that she does like Boost and Ensure but she prefers vanilla Ensure. Pt reports that her appetite is poor in hospital. RD discussed with pt the importance of adequate nutrition needed to preserve lean muscle. Encouraged pt to try and drink 2-3 Ensure per day. RD will add supplements and MVI to help pt meet her estimated needs. Pt is at high refeed risk. RD will obtain NFPE at follow up. Pt is at high risk for malnutrition.   Medications reviewed and include: synthroid, NaCl '@150ml'$ /hr, vitamin K  Labs reviewed: K 4.4 wnl, BUN 41(H), creat 2.56(H) Wbc- 2.2(L), Hgb 7.2(L), Hct  23.5(L)  NUTRITION - FOCUSED PHYSICAL EXAM: Unable to perform at this time   Diet Order:   Diet Order             Diet NPO time specified  Diet effective midnight           Diet regular Room service appropriate? Yes; Fluid consistency: Thin  Diet effective now                  EDUCATION NEEDS:   No education needs have been identified at this time  Skin:  Skin Assessment: Reviewed RN Assessment  Last BM:  8/16  Height:   Ht Readings from Last 1 Encounters:  08/28/21 '5\' 7"'$  (1.702 m)    Weight:   Wt Readings from Last 1 Encounters:  08/28/21 79.8 kg    Ideal Body Weight:  61.36 kg  BMI:  There is no height or weight on file to calculate BMI.  Estimated Nutritional Needs:   Kcal:  1900-2200kcal/day  Protein:  95-110g/day  Fluid:  1.7-1.9L/day  Koleen Distance MS, RD, LDN Please refer to [facility] for RD and/or RD on-call/weekend/after hours pager

## 2021-08-29 NOTE — Progress Notes (Signed)
[name] [name] 07-04-1945  [id number].    Requesting MD: Cruzita Lederer, MD Chief Complaint/Reason for Consult: lymph node biopsy  HPI:  [name] [name] is a 76 y/o F with PMH HTN, HLD, CKD III, afib on Eliquis, and hypothyroidism who presented to the ED  from the cancer center with a cc worsening generalized weakness. Also c/o poor appetite (nothing tastes good), weight loss, chills, and fatigue. She has had these symptoms for over a year but, according to the patient and her husband, they have gotten much worse in the last few weeks. At this point she cannot ambulate more than a few steps using a Tippins, and a personal assist, before needing to rest. She has been seen by oncology who performed a bone marrow bx 08/13/2021 and the results were inconclusive. PET scan performed 8/16 showed multiple hypermetabolic areas in the lymph nodes and general surgery is consulted for LN biopsy.   Last reported  dose of Eliquis was Wednesday 8/16 She denies tobacco use (never smoker)  She denies any known serious allergies but her chart lists 10 drug allergies.  Denies previous complications undergoing general anesthesia.  SHx: nissen fundoplication, cholecystectomy, partial abdominal hysterectomy   ROS: as above Review of Systems  All other systems reviewed and are negative.   Family History  Problem Relation Age of Onset   Colon cancer Mother    Heart disease Father    Breast cancer Neg Hx    Thyroid disease Neg Hx     Past Medical History:  Diagnosis Date   A-fib (HCC)    paroxysmal   Anxiety    Arthritis    Cataracts, bilateral    immature   CKD (chronic kidney disease) stage 3, GFR 30-59 ml/min (HCC)    Depression    GERD (gastroesophageal reflux disease)    was on Nexium    GERD (gastroesophageal reflux disease)    Hemorrhoid    History of bronchitis    couple of yrs ago   History of colon polyps    History of peristent atrial fibrillation    Hyperlipidemia    takes Pravastatin  daily   Hypertension    takes Amlodipine and Diovan daily   Hypertension    Pre-diabetes    Sleep apnea    no cpap used   Sleep apnea    study done 20+yrs ago and doesn't use cpap   Urinary urgency    Weakness    in left leg r/t back    Past Surgical History:  Procedure Laterality Date   ABDOMINAL HYSTERECTOMY  1980   partial   ABDOMINAL HYSTERECTOMY     partial   BACK SURGERY     CARDIOVERSION N/A 02/24/2016   Procedure: CARDIOVERSION;  Surgeon: Fay Records, MD;  Location: [facility];  Service: Cardiovascular;  Laterality: N/A;   CARDIOVERSION N/A 03/19/2016   Procedure: CARDIOVERSION;  Surgeon: Fay Records, MD;  Location: [facility];  Service: Cardiovascular;  Laterality: N/A;   CARDIOVERSION N/A 05/15/2021   Procedure: CARDIOVERSION;  Surgeon: Pixie Casino, MD;  Location: [facility];  Service: Cardiovascular;  Laterality: N/A;   [place]     COLONOSCOPY     COLONOSCOPY WITH PROPOFOL  12/29/2011   Procedure: COLONOSCOPY WITH PROPOFOL;  Surgeon: Lear Ng, MD;  Location: [facility];  Service: Endoscopy;  Laterality: N/A;   COLONOSCOPY WITH PROPOFOL N/A 05/24/2014   Procedure: COLONOSCOPY  WITH PROPOFOL;  Surgeon: Wilford Corner, MD;  Location: [facility];  Service: Endoscopy;  Laterality: N/A;   ESOPHAGEAL MANOMETRY N/A 02/22/2017   Procedure: ESOPHAGEAL MANOMETRY (EM);  Surgeon: Wilford Corner, MD;  Location: [facility];  Service: Endoscopy;  Laterality: N/A;   ESOPHAGOGASTRODUODENOSCOPY     FOOT SURGERY     left, right   FRACTURE SURGERY     left leg-knee   HEEL SPUR EXCISION Bilateral    HOT HEMOSTASIS  12/29/2011   Procedure: HOT HEMOSTASIS (ARGON PLASMA COAGULATION/BICAP);  Surgeon: Lear Ng, MD;  Location: [facility];  Service: Endoscopy;  Laterality: N/A;   HOT HEMOSTASIS N/A 05/24/2014   Procedure: HOT HEMOSTASIS (ARGON PLASMA COAGULATION/BICAP);  Surgeon: Wilford Corner, MD;  Location: [facility];  Service: Endoscopy;  Laterality: N/A;   IR BONE MARROW BIOPSY & ASPIRATION  08/13/2021   leg surgery d/t break Left    LUMBAR LAMINECTOMY/DECOMPRESSION MICRODISCECTOMY Left 04/05/2013   Procedure: LUMBAR FOUR TO FIVE LUMBAR LAMINECTOMY/DECOMPRESSION MICRODISCECTOMY 1 LEVEL;  Surgeon: Eustace Moore, MD;  Location: [facility];  Service: Neurosurgery;  Laterality: Left;   NISSEN FUNDOPLICATION     [place] IMPEDANCE STUDY N/A 02/22/2017   Procedure: [place] IMPEDANCE STUDY;  Surgeon: Wilford Corner, MD;  Location[facility];  Service: Endoscopy;  Laterality: N/A;   TEE WITHOUT CARDIOVERSION N/A 02/24/2016   Procedure: TRANSESOPHAGEAL ECHOCARDIOGRAM (TEE);  Surgeon: Fay Records, MD;  Location: [facility];  Service: Cardiovascular;  Laterality: N/A;   TOTAL KNEE ARTHROPLASTY Left 10/07/2016   TOTAL KNEE ARTHROPLASTY Left 10/07/2016   Procedure: TOTAL KNEE ARTHROPLASTY;  Surgeon: [name], MD;  Location: [facility];  Service: Orthopedics;  Laterality: Left;    Social History:  reports that she has never smoked. She has never used smokeless tobacco. She reports that she does not drink alcohol and does not use drugs.  Allergies:  Allergies  Allergen Reactions   Omnicef [Cefdinir] Swelling    Tongue swelling   Bacitracin     Rash   Bacitracin-Polymyxin B     Rash   Duloxetine Hcl     felt funny- thinking messed up   Lexapro [Escitalopram]     Makes her feel funny and sleepy   Mirtazapine     weight gain in high doses    Penicillins Swelling    Tongue swell   Peroxide [Hydrogen Peroxide] Other (See Comments)    Redness.    Zoloft [Sertraline Hcl]     Makes her feel funny   Neosporin [Neomycin-Bacitracin Zn-Polymyx] Rash    Blisters, itching.     Medications Prior to Admission  Medication Sig Dispense Refill   acetaminophen (TYLENOL) 500 MG tablet Take 1,000 mg by mouth every 6 (six) hours as needed for moderate pain.     albuterol (VENTOLIN HFA)  108 (90 Base) MCG/ACT inhaler Inhale 1-2 puffs into the lungs every 4 (four) hours as needed for wheezing or shortness of breath. 1 g 1   ALPRAZolam (XANAX) 0.5 MG tablet Take 1 tablet (0.5 mg total) by mouth 3 (three) times daily as needed for anxiety. 40 tablet 0   amiodarone (PACERONE) 200 MG tablet Taking one tablet by mouth daily (Patient taking differently: Take 200 mg by mouth daily.) 90 tablet 1   apixaban (ELIQUIS) 5 MG TABS tablet Take 1 tablet (5 mg total) by mouth 2 (two) times daily. 180 tablet 3   Ascorbic Acid (VITAMIN C WITH ROSE HIPS) 1000 MG tablet Take 1,000 mg by mouth daily.  buPROPion (WELLBUTRIN XL) 300 MG 24 hr tablet Take 300 mg by mouth daily.     Calcium Carb-Cholecalciferol (CALCIUM 500 + D PO) Take 1 tablet by mouth daily.     Coenzyme Q10 (CO Q-10) 100 MG CAPS Take 100 mg by mouth daily.     Cyanocobalamin (B-12) 2000 MCG TABS Take 2,000 mcg by mouth daily.     doxycycline (VIBRAMYCIN) 100 MG capsule Take 100 mg by mouth daily.     famotidine (PEPCID) 20 MG tablet Take 20 mg by mouth daily as needed for heartburn or indigestion.     fexofenadine (ALLEGRA) 180 MG tablet Take 180 mg by mouth daily.     furosemide (LASIX) 20 MG tablet TAKE 1 TABLET EVERY DAY (Patient taking differently: Take 20 mg by mouth daily.) 90 tablet 3   levothyroxine (SYNTHROID) 75 MCG tablet TAKE 1 TABLET DAILY BEFORE BREAKFAST (NEED APPOINTMENT FOR FURTHER REFILLS) (Patient taking differently: Take 75 mcg by mouth daily before breakfast.) 90 tablet 0   Magnesium 250 MG TABS Take 250 mg by mouth daily.     metoprolol succinate (TOPROL-XL) 25 MG 24 hr tablet Take 1 tablet (25 mg total) by mouth daily. 90 tablet 3   mirtazapine (REMERON) 30 MG tablet Take 1 tablet by mouth at bedtime.     potassium chloride SA (KLOR-CON M) 20 MEQ tablet TAKE 1 TABLET EVERY DAY (Patient taking differently: Take 20 mEq by mouth daily.) 90 tablet 2   pravastatin (PRAVACHOL) 40 MG tablet Take 40 mg by mouth at  bedtime.     sodium chloride (OCEAN) 0.65 % SOLN nasal spray Place 1 spray into both nostrils daily as needed for congestion.     Vilazodone HCl 20 MG TABS Take 1 tablet by mouth daily.     benzonatate (TESSALON) 200 MG capsule Take 1 capsule by mouth three times daily as needed for cough (Patient not taking: Reported on 07/22/2021) 60 capsule 0   Spacer/Aero-Holding Chambers (AEROCHAMBER MV) inhaler Use as instructed 1 each 0     Physical Exam: Blood pressure (!) 108/40, pulse 73, temperature 98.2 F (36.8 C), temperature source Oral, resp. rate 16, SpO2 92 %. General: Pleasant white female laying on hospital bed, appears stated age, NAD. HEENT: head -normocephalic, atraumatic; Eyes: PERRLA, no conjunctival injection; anicteric sclerae  Neck- Trachea is midline, no thyromegaly  CV- RRR, normal S1/S2, no M/R/G, radial and dorsalis pedis pulses 2+ BL, cap refill < 2 seconds. Pulm- breathing is non-labored. CTAB Abd- soft, NT/ND, abd striae present GU- deferred  MSK- UE/LE symmetrical, no cyanosis, clubbing, or edema. Neuro- nonfocal exam  Psych- Alert and Oriented x3 with appropriate affect Skin: warm and dry, no rashes or lesions; no palpable cervical or axillary lymphadenopathy, ?may have been able to feed one mobile lymph node in her left inguinal region, couldn't appreciate any lymphadenopathy in the right groin  Results for orders placed or performed during the hospital encounter of 08/28/21 (from the past 48 hour(s))  Comprehensive metabolic panel     Status: Abnormal   Collection Time: 08/29/21  5:40 AM  Result Value Ref Range   Sodium 140 135 - 145 mmol/L   Potassium 4.4 3.5 - 5.1 mmol/L   Chloride 111 98 - 111 mmol/L   CO2 22 22 - 32 mmol/L   Glucose, Bld 87 70 - 99 mg/dL    Comment: Glucose reference range applies only to samples taken after fasting for at least 8 hours.   BUN 41 (H) 8 -  23 mg/dL   Creatinine, Ser 2.56 (H) 0.44 - 1.00 mg/dL   Calcium 8.2 (L) 8.9 - 10.3  mg/dL   Total Protein 4.6 (L) 6.5 - 8.1 g/dL   Albumin 2.2 (L) 3.5 - 5.0 g/dL   AST 18 15 - 41 U/L   ALT 9 0 - 44 U/L   Alkaline Phosphatase 42 38 - 126 U/L   Total Bilirubin 0.9 0.3 - 1.2 mg/dL   GFR, Estimated 19 (L) >60 mL/min    Comment: (NOTE) Calculated using the CKD-EPI Creatinine Equation (2021)    Anion gap 7 5 - 15    Comment: Performed at [facility], [place] [address] [place], [place] [address]  CBC     Status: Abnormal   Collection Time: 08/29/21  5:40 AM  Result Value Ref Range   WBC 2.2 (L) 4.0 - 10.5 K/uL   RBC 2.43 (L) 3.87 - 5.11 MIL/uL   Hemoglobin 7.2 (L) 12.0 - 15.0 g/dL   HCT 23.5 (L) 36.0 - 46.0 %   MCV 96.7 80.0 - 100.0 fL   MCH 29.6 26.0 - 34.0 pg   MCHC 30.6 30.0 - 36.0 g/dL   RDW 18.8 (H) 11.5 - 15.5 %   Platelets 70 (L) 150 - 400 K/uL    Comment: SPECIMEN CHECKED FOR CLOTS Immature Platelet Fraction may be clinically indicated, consider ordering this additional test [id number] REPEATED TO VERIFY PLATELET COUNT CONFIRMED BY SMEAR    nRBC 0.0 0.0 - 0.2 %    Comment: Performed at [facility], [place] [address] [place], [place] [address]  Protime-INR     Status: Abnormal   Collection Time: 08/29/21  5:40 AM  Result Value Ref Range   Prothrombin Time 25.8 (H) 11.4 - 15.2 seconds   INR 2.4 (H) 0.8 - 1.2    Comment: (NOTE) INR goal varies based on device and disease states. Performed at [facility], [place] [address] [place], [place] [address]   TSH     Status: Abnormal   Collection Time: 08/29/21  5:40 AM  Result Value Ref Range   TSH 0.214 (L) 0.350 - 4.500 uIU/mL    Comment: Performed by a 3rd Generation assay with a functional sensitivity of <=0.01 uIU/mL. Performed at [facility], [place] [address] [place], [place] [address]   DIC Panel Add-On to previous collection     Status: Abnormal (Preliminary result)   Collection Time: 08/29/21  8:06 AM  Result Value Ref Range    Prothrombin Time 25.0 (H) 11.4 - 15.2 seconds   INR 2.3 (H) 0.8 - 1.2    Comment: (NOTE) INR goal varies based on device and disease states.    aPTT 49 (H) 24 - 36 seconds    Comment:        IF BASELINE aPTT IS ELEVATED, SUGGEST PATIENT RISK ASSESSMENT BE USED TO DETERMINE APPROPRIATE ANTICOAGULANT THERAPY.    Fibrinogen 346 210 - 475 mg/dL    Comment: (NOTE) Fibrinogen results may be underestimated in patients receiving thrombolytic therapy.    D-Dimer, Quant <0.27 0.00 - 0.50 ug/mL-FEU    Comment: (NOTE) At the manufacturer cut-off value of 0.5 g/mL FEU, this assay has a negative predictive value of 95-100%.This assay is intended for use in conjunction with a clinical pretest probability (PTP) assessment model to exclude pulmonary embolism (PE) and deep venous thrombosis (DVT) in outpatients suspected of PE or DVT. Results should be correlated with clinical presentation.  Platelets 70 (L) 150 - 400 K/uL    Comment: Immature Platelet Fraction may be clinically indicated, consider ordering this additional test [id number] CONSISTENT WITH PREVIOUS RESULT Performed at [facility], [place] [address], [place], [place] [address]    Smear Review PENDING   Reticulocytes     Status: Abnormal   Collection Time: 08/29/21  8:06 AM  Result Value Ref Range   Retic Ct Pct 4.6 (H) 0.4 - 3.1 %   RBC. 2.57 (L) 3.87 - 5.11 MIL/uL   Retic Count, Absolute 118.0 19.0 - 186.0 K/uL   Immature Retic Fract 19.7 (H) 2.3 - 15.9 %    Comment: Performed at [facility], [place] [address], [place], [place] [address]  Lactate dehydrogenase     Status: Abnormal   Collection Time: 08/29/21  8:06 AM  Result Value Ref Range   LDH 993 (H) 98 - 192 U/L    Comment: Performed at [facility], [place] [address] [place], [place] [address]  Uric acid     Status: Abnormal   Collection Time: 08/29/21  8:06 AM  Result Value Ref Range   Uric Acid, Serum 9.0  (H) 2.5 - 7.1 mg/dL    Comment: Performed at [facility], [place] [address] [place], [place] [address]  Type and screen [place]     Status: None   Collection Time: 08/29/21  9:13 AM  Result Value Ref Range   ABO/RH(D) A POS    Antibody Screen NEG    Sample Expiration      09/01/2021,2359 Performed at [facility], [place] [address] [place], [place] [address]    NM PET Image Initial (PI) Skull Base To Thigh  Result Date: 08/29/2021 CLINICAL DATA:  Initial treatment strategy for progressive pancytopenia. Severe fatigue. Anorexia with weight loss. Night sweats. EXAM: NUCLEAR MEDICINE PET SKULL BASE TO THIGH TECHNIQUE: 9.1 mCi F-18 FDG was injected intravenously. Full-ring PET imaging was performed from the skull base to thigh after the radiotracer. CT data was obtained and used for attenuation correction and anatomic localization. Fasting blood glucose: 99 mg/dl COMPARISON:  02/17/2021 CT chest, abdomen and pelvis. FINDINGS: Mediastinal blood pool activity: SUV max 2.7 Liver activity: SUV max 4.3 NECK: Several small hypermetabolic bilateral neck lymph nodes. Representative left level 2 neck 0.5 cm node with max SUV 10.5 (series 4/image 34). Representative 0.6 cm right level 3 neck node with max SUV 13.8 (series 4/image 38). Incidental CT findings: None. CHEST: Hypermetabolic left axillary lymphadenopathy with dominant 1.0 cm left axillary node with max SUV 23.6 (series 4/image 59). No enlarged or hypermetabolic right axillary nodes. Several mildly enlarged hypermetabolic bilateral mediastinal lymph nodes. Representative 1.1 cm right paratracheal node with max SUV 14.2 (series 4/image 65). Representative 1.0 cm subcarinal node with max SUV 29.5 (series 4/image 75). Hypermetabolic left hilar adenopathy with max SUV 7.4. No hypermetabolic right hilar adenopathy. Generalized hypermetabolism throughout the bilateral lung fields with associated faint  ground-glass opacity in both lungs with representative max SUV 5.1 in the left lower lobe. Incidental CT findings: Coronary atherosclerosis. Atherosclerotic nonaneurysmal thoracic aorta. No significant pulmonary nodules. ABDOMEN/PELVIS: Widespread hypermetabolic lymphadenopathy in the porta hepatis, portacaval, aortocaval, left para-aortic, mesenteric, left common iliac, left external iliac and left inguinal nodal chains. Representative 1.3 cm porta hepatis node with max SUV 13.0 (series 5/image 115). Representative 1.3 cm aortocaval node with max SUV 25.7 (series 4/image 141). Representative 1.5 cm left para-aortic node with max SUV  26.7 (series 4/image 138). Representative 1.0 cm right mesenteric node with max SUV 21.2 (series 4/image 145). Representative 1.3 cm left external iliac node with max SUV 15.8 (series 4/image 172). Representative 1.0 cm left inguinal node with max SUV 15.6 (series 4/image 187). Mild-to-moderate splenomegaly. Craniocaudal splenic length 16.3 cm. Splenic FDG uptake is equivalent to the liver with max SUV 4.4. No focal splenic masses. No abnormal hypermetabolic activity within the liver, pancreas or adrenal glands. Incidental CT findings: Postsurgical changes from fundoplication in the proximal stomach. Atherosclerotic nonaneurysmal abdominal aorta. Hysterectomy. SKELETON: Multiple hypermetabolic skeletal lesions in the axial and visualized proximal appendicular skeleton. Representative right T11 vertebral/posterior element lesion with max SUV 15.4. Representative proximal left humerus metaphysis lesion with max SUV 5.0. Representative left mandibular body lesion with max SUV 6.7. Incidental CT findings: None. IMPRESSION: 1. Widespread intensely hypermetabolic lymphadenopathy in the neck, left axilla, mediastinum, retroperitoneum and left pelvis. Mild-to-moderate splenomegaly. Scattered hypermetabolic skeletal lesions. Generalized bilateral pulmonary hypermetabolism with associated faint  ground-glass opacity. Findings are most compatible with lymphoproliferative disorder/lymphoma. Deauville score 5. 2. Chronic findings include: Aortic Atherosclerosis (ICD10-I70.0). Coronary atherosclerosis. Electronically Signed   By: Ilona Sorrel M.D.   On: 08/29/2021 10:09      Assessment/Plan This is a pleasant 76 y/o F with a history of progressive generalized weakness, fatigue, weigh loss, chills, and poor appetite. Recent Bone marrow Bx and PET scan concerning for lymphoma. We are asked for excisional lymph node biopsy which I think is reasonable - based on imaging and exam likely left inguinal LN Bx. Her last dose of Eliquis was Wednesday but her INR is 2.4. This would need to be around 1.5-1.75 before the surgeons would consider Bx. Will discuss surgical timing with MD.   FEN - ok for diet from [facility] standpoint VTE - SCD's, chemical VTE held  ID - None Admit - to [facility] service  HTN Afib HLD Pancytopenia Anxiety  Physical deconditioning   I reviewed nursing notes, Consultant oncology notes, last 24 h vitals and pain scores, last 48 h intake and output, last 24 h labs and trends, and last 24 h imaging results.  Jill Alexanders, [facility] 08/29/2021, 10:32 AM Please see Amion for pager number during day hours 7:00am-4:30pm or 7:00am -11:30am on weekends

## 2021-08-29 NOTE — Progress Notes (Signed)
PT Cancellation Note  Patient Details Name: [name] [name] MRN: [id number] DOB: 03-May-1945   Cancelled Treatment:    Reason Eval/Treat Not Completed: Fatigue/lethargy limiting ability to participate, will check back another time. Has ambulated with staff to BR .  [place] Office [phone] Weekend [phone]    Claretha Cooper 08/29/2021, 11:42 AM

## 2021-08-29 NOTE — Progress Notes (Signed)
PROGRESS NOTE  [name] [name] KKX:[id number] DOB: 02/22/1945 DOA: 08/28/2021 PCP: Harlan Stains, MD   LOS: 1 day   Brief Narrative / Interim history: 76 y.o. female with medical history significant of paroxysmal A-fib, hypertension, hyperlipidemia, hypothyroidism, who comes into the hospital with complaints of generalized weakness.  Patient has been having increased weakness, poor appetite, nightly chills over the last year and a half.  She also had an associated 40 pound weight loss, and has gotten from being fully independent to urinate after go to now being two-person assist and barely able to ambulate a few steps with a [place]. She underwent a bone marrow biopsy which was suspicion but not necessarily conclusive. She was eventually admitted from the cancer center for lymph node biopsy and initiation of treatment  Subjective / 24h Interval events: Remains weak this morning.  No chest pain, no shortness of breath.  Assesement and Plan: Principal Problem:   Generalized weakness Active Problems:   PAF (paroxysmal atrial fibrillation) (HCC)   Essential hypertension   (HFpEF) heart failure with preserved ejection fraction (HCC)   Hypothyroidism   Pancytopenia (HCC)  Principal problem Probable lymphoma-General surgery consulted, INR still elevated and they are unable to do the biopsy today, potentially plan to do it tomorrow.  N.p.o. after midnight.  Continue to hold Eliquis.  Active problems Paroxysmal A-fib-currently appears in sinus.  Continue amiodarone, metoprolol   Essential hypertension-resume home medications   Hypothyroidism-continue Synthroid.  TSH 0.2   Hyperlipidemia-continue statin   Pancytopenia-likely due to underlying lymphoma.  Monitor counts   Generalized weakness, weight loss, FTT-significant amount of weight loss in the last year and a half, now extremely weak and deconditioned requiring 2+ person assist.  PT consult   Anxiety- continue Xanax as  needed  Scheduled Meds:  amiodarone  200 mg Oral Daily   feeding supplement  237 mL Oral BID BM   levothyroxine  75 mcg Oral QAC breakfast   metoprolol succinate  25 mg Oral Daily   pravastatin  40 mg Oral QHS   Continuous Infusions:  sodium chloride 150 mL/hr at 08/29/21 0759   PRN Meds:.acetaminophen **OR** acetaminophen, albuterol, ALPRAZolam, bisacodyl, ondansetron **OR** ondansetron (ZOFRAN) IV  Diet Orders (From admission, onward)     Start     Ordered   08/29/21 0904  Diet regular Room service appropriate? Yes; Fluid consistency: Thin  Diet effective now       Question Answer Comment  Room service appropriate? Yes   Fluid consistency: Thin      08/29/21 0904            DVT prophylaxis: SCDs Start: 08/28/21 1743   Lab Results  Component Value Date   PLT 70 (L) 08/29/2021      Code Status: DNR  Family Communication: Husband at bedside  Status is: Inpatient Remains inpatient appropriate because: severe weakness, awaiting biopsy   Level of care: Med-Surg  Consultants:  Oncology  Surgery   Objective: Vitals:   08/28/21 1751 08/28/21 2215 08/29/21 0431 08/29/21 1216  BP: 104/63 (!) 99/43 (!) 108/40 (!) 93/41  Pulse: 78 79 73 66  Resp: _0 Temp: 99.3 F (37.4 C) 100.3 F (37.9 C) 98.2 F (36.8 C) 98.1 F (36.7 C)  TempSrc: Oral  Oral Oral  SpO2: 98% 93% 92% 98%   No intake or output data in the 24 hours ending 08/29/21 1222 Wt Readings from Last 3 Encounters:  08/28/21 79.8 kg  08/22/21 82.1 kg  08/13/21 84.4  kg    Examination:  Constitutional: NAD Eyes: no scleral icterus ENMT: Mucous membranes are moist.  Neck: normal, supple Respiratory: clear to auscultation bilaterally, no wheezing, no crackles. Normal respiratory effort. No accessory muscle use.  Cardiovascular: Regular rate and rhythm, no murmurs / rubs / gallops. No LE edema.  Abdomen: non distended, no tenderness. Bowel sounds positive.  Musculoskeletal: no clubbing /  cyanosis.  Skin: no rashes Neurologic: non focal   Data Reviewed: I have independently reviewed following labs and imaging studies   CBC Recent Labs  Lab 08/29/21 0540 08/29/21 0806  WBC 2.2*  --   HGB 7.2*  --   HCT 23.5*  --   PLT 70* 70*  MCV 96.7  --   MCH 29.6  --   MCHC 30.6  --   RDW 18.8*  --     Recent Labs  Lab 08/29/21 0540 08/29/21 0806  NA 140  --   K 4.4  --   CL 111  --   CO2 22  --   GLUCOSE 87  --   BUN 41*  --   CREATININE 2.56*  --   CALCIUM 8.2*  --   AST 18  --   ALT 9  --   ALKPHOS 42  --   BILITOT 0.9  --   ALBUMIN 2.2*  --   DDIMER  --  <0.27  INR 2.4* 2.3*  TSH 0.214*  --     ------------------------------------------------------------------------------------------------------------------ No results for input(s): "CHOL", "HDL", "LDLCALC", "TRIG", "CHOLHDL", "LDLDIRECT" in the last 72 hours.  Lab Results  Component Value Date   HGBA1C 5.8 (H) 09/25/2016   ------------------------------------------------------------------------------------------------------------------ Recent Labs    08/29/21 0540  TSH 0.214*    Cardiac Enzymes No results for input(s): "CKMB", "TROPONINI", "MYOGLOBIN" in the last 168 hours.  Invalid input(s): "CK" ------------------------------------------------------------------------------------------------------------------    Component Value Date/Time   BNP 150.2 (H) 02/17/2016 1644    CBG: Recent Labs  Lab 08/27/21 1436 08/29/21 1148  GLUCAP 99 109*    No results found for this or any previous visit (from the past 240 hour(s)).   Radiology Studies: No results found.   Marzetta Board, MD, PhD [facility]  Between 7 am - 7 pm I am available, please contact me via Amion (for emergencies) or Securechat (non urgent messages)  Between 7 pm - 7 am I am not available, please contact night coverage MD/APP via Amion

## 2021-08-30 ENCOUNTER — Inpatient Hospital Stay (HOSPITAL_COMMUNITY): Payer: Medicare HMO | Admitting: Registered Nurse

## 2021-08-30 ENCOUNTER — Encounter (HOSPITAL_COMMUNITY)
Admission: AD | Disposition: A | Discharge status: Home / Self Care | Payer: Self-pay | Source: Ambulatory Visit | Attending: Internal Medicine

## 2021-08-30 DIAGNOSIS — D631 Anemia in chronic kidney disease: Secondary | ICD-10-CM

## 2021-08-30 DIAGNOSIS — I898 Other specified noninfective disorders of lymphatic vessels and lymph nodes: Secondary | ICD-10-CM | POA: Diagnosis not present

## 2021-08-30 DIAGNOSIS — R531 Weakness: Secondary | ICD-10-CM | POA: Diagnosis not present

## 2021-08-30 DIAGNOSIS — R591 Generalized enlarged lymph nodes: Secondary | ICD-10-CM

## 2021-08-30 DIAGNOSIS — I129 Hypertensive chronic kidney disease with stage 1 through stage 4 chronic kidney disease, or unspecified chronic kidney disease: Secondary | ICD-10-CM | POA: Diagnosis not present

## 2021-08-30 DIAGNOSIS — N189 Chronic kidney disease, unspecified: Secondary | ICD-10-CM

## 2021-08-30 DIAGNOSIS — Z9289 Personal history of other medical treatment: Secondary | ICD-10-CM

## 2021-08-30 HISTORY — PX: LYMPH NODE BIOPSY: SHX201

## 2021-08-30 HISTORY — DX: Personal history of other medical treatment: Z92.89

## 2021-08-30 LAB — PROTIME-INR
INR: 1.6 — ABNORMAL HIGH (ref 0.8–1.2)
Prothrombin Time: 19.2 seconds — ABNORMAL HIGH (ref 11.4–15.2)

## 2021-08-30 LAB — COMPREHENSIVE METABOLIC PANEL
ALT: 9 U/L (ref 0–44)
AST: 19 U/L (ref 15–41)
Albumin: 2 g/dL — ABNORMAL LOW (ref 3.5–5.0)
Alkaline Phosphatase: 39 U/L (ref 38–126)
Anion gap: 4 — ABNORMAL LOW (ref 5–15)
BUN: 35 mg/dL — ABNORMAL HIGH (ref 8–23)
CO2: 22 mmol/L (ref 22–32)
Calcium: 7.8 mg/dL — ABNORMAL LOW (ref 8.9–10.3)
Chloride: 113 mmol/L — ABNORMAL HIGH (ref 98–111)
Creatinine, Ser: 2.07 mg/dL — ABNORMAL HIGH (ref 0.44–1.00)
GFR, Estimated: 25 mL/min — ABNORMAL LOW (ref >60)
Glucose, Bld: 93 mg/dL (ref 70–99)
Potassium: 4 mmol/L (ref 3.5–5.1)
Sodium: 139 mmol/L (ref 135–145)
Total Bilirubin: 0.7 mg/dL (ref 0.3–1.2)
Total Protein: 4.2 g/dL — ABNORMAL LOW (ref 6.5–8.1)

## 2021-08-30 LAB — CBC
HCT: 22.6 % — ABNORMAL LOW (ref 36.0–46.0)
Hemoglobin: 6.8 g/dL — CL (ref 12.0–15.0)
MCH: 29.6 pg (ref 26.0–34.0)
MCHC: 30.1 g/dL (ref 30.0–36.0)
MCV: 98.3 fL (ref 80.0–100.0)
Platelets: 64 10*3/uL — ABNORMAL LOW (ref 150–400)
RBC: 2.3 MIL/uL — ABNORMAL LOW (ref 3.87–5.11)
RDW: 19.2 % — ABNORMAL HIGH (ref 11.5–15.5)
WBC: 1.8 10*3/uL — ABNORMAL LOW (ref 4.0–10.5)
nRBC: 0 % (ref 0.0–0.2)

## 2021-08-30 LAB — PHOSPHORUS: Phosphorus: 3.3 mg/dL (ref 2.5–4.6)

## 2021-08-30 LAB — PREPARE RBC (CROSSMATCH)

## 2021-08-30 LAB — APTT: aPTT: 38 seconds — ABNORMAL HIGH (ref 24–36)

## 2021-08-30 LAB — HEMOGLOBIN AND HEMATOCRIT, BLOOD
HCT: 27.6 % — ABNORMAL LOW (ref 36.0–46.0)
Hemoglobin: 8.3 g/dL — ABNORMAL LOW (ref 12.0–15.0)

## 2021-08-30 LAB — MAGNESIUM: Magnesium: 1.9 mg/dL (ref 1.7–2.4)

## 2021-08-30 SURGERY — LYMPH NODE BIOPSY
Anesthesia: General | Site: Groin | Laterality: Left

## 2021-08-30 MED ORDER — LACTATED RINGERS IV SOLN: INTRAVENOUS | Status: DC | PRN

## 2021-08-30 MED ORDER — DEXAMETHASONE SODIUM PHOSPHATE 10 MG/ML IJ SOLN
INTRAMUSCULAR | Status: DC | PRN
  Administered 2021-08-30: 8 mg via INTRAVENOUS

## 2021-08-30 MED ORDER — PHENYLEPHRINE 80 MCG/ML (10ML) SYRINGE FOR IV PUSH (FOR BLOOD PRESSURE SUPPORT)
PREFILLED_SYRINGE | INTRAVENOUS | Status: AC
  Filled 2021-08-30: qty 10

## 2021-08-30 MED ORDER — FENTANYL CITRATE (PF) 100 MCG/2ML IJ SOLN
INTRAMUSCULAR | Status: DC | PRN
  Administered 2021-08-30 (×2): 25 ug via INTRAVENOUS

## 2021-08-30 MED ORDER — ACETAMINOPHEN 10 MG/ML IV SOLN: 1000.0000 mg | Freq: Once | INTRAVENOUS | Status: DC | PRN

## 2021-08-30 MED ORDER — LIDOCAINE HCL (PF) 2 % IJ SOLN
INTRAMUSCULAR | Status: AC
  Filled 2021-08-30: qty 5

## 2021-08-30 MED ORDER — DEXAMETHASONE SODIUM PHOSPHATE 10 MG/ML IJ SOLN
INTRAMUSCULAR | Status: AC
  Filled 2021-08-30: qty 1

## 2021-08-30 MED ORDER — LIDOCAINE 2% (20 MG/ML) 5 ML SYRINGE
INTRAMUSCULAR | Status: DC | PRN
  Administered 2021-08-30: 40 mg via INTRAVENOUS

## 2021-08-30 MED ORDER — BUPIVACAINE HCL (PF) 0.5 % IJ SOLN
INTRAMUSCULAR | Status: DC | PRN
  Administered 2021-08-30: 10 mL

## 2021-08-30 MED ORDER — VANCOMYCIN HCL 1000 MG IV SOLR
INTRAVENOUS | Status: DC | PRN
  Administered 2021-08-30: 1000 mg via INTRAVENOUS

## 2021-08-30 MED ORDER — PREDNISONE 50 MG PO TABS
60.0000 mg | ORAL_TABLET | Freq: Every day | ORAL | Status: DC
  Administered 2021-08-30 – 2021-08-31 (×2): 60 mg via ORAL
  Filled 2021-08-30 (×2): qty 1

## 2021-08-30 MED ORDER — BUPIVACAINE HCL (PF) 0.5 % IJ SOLN
INTRAMUSCULAR | Status: AC
  Filled 2021-08-30: qty 30

## 2021-08-30 MED ORDER — PROPOFOL 10 MG/ML IV BOLUS
INTRAVENOUS | Status: AC
  Filled 2021-08-30: qty 20

## 2021-08-30 MED ORDER — PROPOFOL 10 MG/ML IV BOLUS
INTRAVENOUS | Status: DC | PRN
  Administered 2021-08-30: 140 mg via INTRAVENOUS

## 2021-08-30 MED ORDER — SODIUM CHLORIDE 0.9% IV SOLUTION: Freq: Once | INTRAVENOUS | Status: DC

## 2021-08-30 MED ORDER — FENTANYL CITRATE (PF) 100 MCG/2ML IJ SOLN
INTRAMUSCULAR | Status: AC
  Filled 2021-08-30: qty 2

## 2021-08-30 MED ORDER — SODIUM CHLORIDE 0.9 % IV BOLUS
500.0000 mL | Freq: Once | INTRAVENOUS | Status: AC
Start: 2021-08-30 — End: 2021-08-31
  Administered 2021-08-30: 500 mL via INTRAVENOUS

## 2021-08-30 MED ORDER — FENTANYL CITRATE PF 50 MCG/ML IJ SOSY: 25.0000 ug | PREFILLED_SYRINGE | INTRAMUSCULAR | Status: DC | PRN

## 2021-08-30 MED ORDER — OXYCODONE HCL 5 MG PO TABS: 5.0000 mg | ORAL_TABLET | ORAL | Status: DC | PRN

## 2021-08-30 MED ORDER — 0.9 % SODIUM CHLORIDE (POUR BTL) OPTIME
TOPICAL | Status: DC | PRN
  Administered 2021-08-30: 1000 mL

## 2021-08-30 MED ORDER — PHENYLEPHRINE 80 MCG/ML (10ML) SYRINGE FOR IV PUSH (FOR BLOOD PRESSURE SUPPORT)
PREFILLED_SYRINGE | INTRAVENOUS | Status: DC | PRN
  Administered 2021-08-30: 160 ug via INTRAVENOUS

## 2021-08-30 MED ORDER — ONDANSETRON HCL 4 MG/2ML IJ SOLN
INTRAMUSCULAR | Status: AC
  Filled 2021-08-30: qty 2

## 2021-08-30 MED ORDER — ARTIFICIAL TEARS OPHTHALMIC OINT
TOPICAL_OINTMENT | OPHTHALMIC | Status: AC
Start: 2021-08-30 — End: ?
  Filled 2021-08-30: qty 3.5

## 2021-08-30 MED ORDER — VANCOMYCIN HCL IN DEXTROSE 1-5 GM/200ML-% IV SOLN
INTRAVENOUS | Status: AC
  Filled 2021-08-30: qty 200

## 2021-08-30 SURGICAL SUPPLY — 19 items
ADH SKN CLS APL DERMABOND .7 (GAUZE/BANDAGES/DRESSINGS) ×1
BAG COUNTER SPONGE SURGICOUNT (BAG) IMPLANT
BAG SPNG CNTER NS LX DISP (BAG)
DERMABOND ADVANCED (GAUZE/BANDAGES/DRESSINGS) ×1
DERMABOND ADVANCED .7 DNX12 (GAUZE/BANDAGES/DRESSINGS) IMPLANT
DRAPE LAPAROTOMY TRNSV 102X78 (DRAPES) ×1 IMPLANT
GAUZE 4X4 16PLY ~~LOC~~+RFID DBL (SPONGE) ×1 IMPLANT
GLOVE BIOGEL PI IND STRL 7.0 (GLOVE) ×1 IMPLANT
GLOVE BIOGEL PI INDICATOR 7.0 (GLOVE) ×1
GLOVE ECLIPSE 8.0 STRL XLNG CF (GLOVE) ×1 IMPLANT
GLOVE INDICATOR 8.0 STRL GRN (GLOVE) ×1 IMPLANT
GOWN STRL REUS W/ TWL LRG LVL3 (GOWN DISPOSABLE) ×1 IMPLANT
GOWN STRL REUS W/TWL LRG LVL3 (GOWN DISPOSABLE) ×1
KIT BASIN OR (CUSTOM PROCEDURE TRAY) ×1 IMPLANT
NEEDLE HYPO 22GX1.5 SAFETY (NEEDLE) ×1 IMPLANT
PACK GENERAL/GYN (CUSTOM PROCEDURE TRAY) ×1 IMPLANT
SYR CONTROL 10ML LL (SYRINGE) ×1 IMPLANT
TOWEL OR 17X26 10 PK STRL BLUE (TOWEL DISPOSABLE) ×1 IMPLANT
TOWEL OR NON WOVEN STRL DISP B (DISPOSABLE) ×1 IMPLANT

## 2021-08-30 NOTE — Anesthesia Preprocedure Evaluation (Addendum)
Anesthesia Evaluation  Patient identified by MRN, date of birth, ID band Patient awake    Reviewed: Allergy & Precautions, NPO status , Patient's Chart, lab work & pertinent test results  Airway Mallampati: II  TM Distance: >3 FB Neck ROM: Full    Dental no notable dental hx.    Pulmonary sleep apnea ,    Pulmonary exam normal        Cardiovascular hypertension, Pt. on medications and Pt. on home beta blockers + dysrhythmias Atrial Fibrillation  Rhythm:Regular Rate:Normal     Neuro/Psych Anxiety Depression negative neurological ROS     GI/Hepatic Neg liver ROS, GERD  ,  Endo/Other  Hypothyroidism   Renal/GU CRFRenal disease  negative genitourinary   Musculoskeletal  (+) Arthritis , Osteoarthritis,    Abdominal Normal abdominal exam  (+)   Peds  Hematology  (+) Blood dyscrasia, anemia , Lab Results      Component                Value               Date                      WBC                      1.8 (L)             08/30/2021                HGB                      6.8 (LL)            08/30/2021                HCT                      22.6 (L)            08/30/2021                MCV                      98.3                08/30/2021                PLT                      64 (L)              08/30/2021             Anesthesia Other Findings   Reproductive/Obstetrics                          Anesthesia Physical Anesthesia Plan  ASA: 3  Anesthesia Plan: General   Post-op Pain Management:    Induction: Intravenous  PONV Risk Score and Plan: 2 and Treatment may vary due to age or medical condition, Ondansetron and Dexamethasone  Airway Management Planned: Mask and LMA  Additional Equipment: None  Intra-op Plan:   Post-operative Plan: Extubation in OR  Informed Consent: I have reviewed the patients History and Physical, chart, labs and discussed the procedure including the  risks, benefits and alternatives for the proposed anesthesia with the patient or authorized representative who has indicated his/her  understanding and acceptance.     Dental advisory given  Plan Discussed with: CRNA  Anesthesia Plan Comments:       Anesthesia Quick Evaluation

## 2021-08-30 NOTE — Progress Notes (Signed)
PROGRESS NOTE  [name] [name] SWN:[id number] DOB: 06-22-1945 DOA: 08/28/2021 PCP: Harlan Stains, MD   LOS: 2 days   Brief Narrative / Interim history: 76 y.o. female with medical history significant of paroxysmal A-fib, hypertension, hyperlipidemia, hypothyroidism, who comes into the hospital with complaints of generalized weakness.  Patient has been having increased weakness, poor appetite, nightly chills over the last year and a half.  She also had an associated 40 pound weight loss, and has gotten from being fully independent to urinate after go to now being two-person assist and barely able to ambulate a few steps with a [place]. She underwent a bone marrow biopsy which was suspicion but not necessarily conclusive. She was eventually admitted from the cancer center for lymph node biopsy and initiation of treatment  Subjective / 24h Interval events: Awaiting biopsy this morning.  Slept better last night.  No chest pain, no shortness of breath  Assesement and Plan: Principal Problem:   Generalized weakness Active Problems:   PAF (paroxysmal atrial fibrillation) (HCC)   Essential hypertension   (HFpEF) heart failure with preserved ejection fraction (HCC)   Hypothyroidism   Pancytopenia ([place])  Principal problem Probable lymphoma-General surgery consulted, she will undergo lymph node biopsy today.  We will discuss with surgery when she could resume Eliquis postprocedure.  Also start prednisone 60 mg daily following the biopsy, per oncology  Active problems Paroxysmal A-fib-currently appears in sinus.  Continue amiodarone, metoprolol   Essential hypertension-resume home medications   Hypothyroidism-continue Synthroid.  TSH 0.2   Hyperlipidemia-continue statin   Pancytopenia-likely due to underlying lymphoma.  Monitor counts, hemoglobin slightly lower today less than 7, transfuse unit of packed red blood cells   Generalized weakness, weight loss, FTT-significant amount of weight loss  in the last year and a half, now extremely weak and deconditioned requiring 2+ person assist.  PT consult   Anxiety- continue Xanax as needed  Scheduled Meds:  [MAR Hold] sodium chloride   Intravenous Once   [MAR Hold] amiodarone  200 mg Oral Daily   [MAR Hold] feeding supplement  237 mL Oral TID BM   [MAR Hold] levothyroxine  75 mcg Oral QAC breakfast   [MAR Hold] metoprolol succinate  25 mg Oral Daily   [MAR Hold] multivitamin with minerals  1 tablet Oral Daily   [MAR Hold] pravastatin  40 mg Oral QHS   Continuous Infusions:  sodium chloride 150 mL/hr at 08/30/21 0930   acetaminophen     vancomycin     PRN Meds:.0.9 % irrigation (POUR BTL), acetaminophen, [MAR Hold] acetaminophen **OR** [MAR Hold] acetaminophen, [MAR Hold] albuterol, [MAR Hold] ALPRAZolam, [MAR Hold] bisacodyl, bupivacaine(PF), fentaNYL (SUBLIMAZE) injection, [MAR Hold] ondansetron **OR** [MAR Hold] ondansetron (ZOFRAN) IV, vancomycin  Diet Orders (From admission, onward)     Start     Ordered   08/30/21 0740  Diet NPO time specified  Diet effective now        08/30/21 0739            DVT prophylaxis: SCDs Start: 08/28/21 1743   Lab Results  Component Value Date   PLT 64 (L) 08/30/2021      Code Status: DNR  Family Communication: Husband at bedside  Status is: Inpatient Remains inpatient appropriate because: severe weakness, awaiting biopsy   Level of care: Med-Surg  Consultants:  Oncology  Surgery   Objective: Vitals:   08/29/21 1216 08/29/21 1957 08/30/21 0356 08/30/21 1109  BP: (!) 93/41 (!) 102/41 (!) 107/43 (!) 98/58  Pulse: 66 73  84 80  Resp: 16 16 16  (!) 24  Temp: 98.1 F (36.7 C) 98.1 F (36.7 C) 100.1 F (37.8 C) 99.2 F (37.3 C)  TempSrc: Oral Oral Oral   SpO2: 98% 93% 91% 100%    Intake/Output Summary (Last 24 hours) at 08/30/2021 1119 Last data filed at 08/30/2021 1112 Gross per 24 hour  Intake 3965.45 ml  Output 605 ml  Net 3360.45 ml   Wt Readings from Last 3  Encounters:  08/28/21 79.8 kg  08/22/21 82.1 kg  08/13/21 84.4 kg    Examination:  Constitutional: NAD Eyes: lids and conjunctivae normal, no scleral icterus ENMT: mmm Neck: normal, supple Respiratory: clear to auscultation bilaterally, no wheezing, no crackles. Normal respiratory effort.  Cardiovascular: Regular rate and rhythm, no murmurs / rubs / gallops. No LE edema. Abdomen: soft, no distention, no tenderness. Bowel sounds positive.  Skin: no rashes Neurologic: no focal deficits, equal strength   Data Reviewed: I have independently reviewed following labs and imaging studies   CBC Recent Labs  Lab 08/29/21 0540 08/29/21 0806 08/30/21 0543  WBC 2.2*  --  1.8*  HGB 7.2*  --  6.8*  HCT 23.5*  --  22.6*  PLT 70* 70* 64*  MCV 96.7  --  98.3  MCH 29.6  --  29.6  MCHC 30.6  --  30.1  RDW 18.8*  --  19.2*     Recent Labs  Lab 08/29/21 0540 08/29/21 0806 08/30/21 0543  NA 140  --  139  K 4.4  --  4.0  CL 111  --  113*  CO2 22  --  22  GLUCOSE 87  --  93  BUN 41*  --  35*  CREATININE 2.56*  --  2.07*  CALCIUM 8.2*  --  7.8*  AST 18  --  19  ALT 9  --  9  ALKPHOS 42  --  39  BILITOT 0.9  --  0.7  ALBUMIN 2.2*  --  2.0*  MG  --   --  1.9  DDIMER  --  <0.27  --   INR 2.4* 2.3* 1.6*  TSH 0.214*  --   --      ------------------------------------------------------------------------------------------------------------------ No results for input(s): "CHOL", "HDL", "LDLCALC", "TRIG", "CHOLHDL", "LDLDIRECT" in the last 72 hours.  Lab Results  Component Value Date   HGBA1C 5.8 (H) 09/25/2016   ------------------------------------------------------------------------------------------------------------------ Recent Labs    08/29/21 0540  TSH 0.214*     Cardiac Enzymes No results for input(s): "CKMB", "TROPONINI", "MYOGLOBIN" in the last 168 hours.  Invalid input(s):  "CK" ------------------------------------------------------------------------------------------------------------------    Component Value Date/Time   BNP 150.2 (H) 02/17/2016 1644    CBG: Recent Labs  Lab 08/27/21 1436 08/29/21 1148  GLUCAP 99 109*     Recent Results (from the past 240 hour(s))  Culture, blood (Routine X 2) w Reflex to ID Panel     Status: None (Preliminary result)   Collection Time: 08/29/21  9:11 AM   Specimen: BLOOD  Result Value Ref Range Status   Specimen Description   Final    BLOOD LEFT ANTECUBITAL Performed at [facility] [address] [place], [place] [address]    Special Requests   Final    BOTTLES DRAWN AEROBIC AND ANAEROBIC Blood Culture adequate volume Performed at [facility] [address] [place], [place] [address]    Culture   Final    NO GROWTH < 24 HOURS Performed at [facility]  [facility], [place] [address] [place], [place] [address]    Report Status PENDING  Incomplete  Culture, blood (Routine X 2) w Reflex to ID Panel     Status: None (Preliminary result)   Collection Time: 08/29/21  9:27 AM   Specimen: BLOOD  Result Value Ref Range Status   Specimen Description   Final    BLOOD RIGHT ANTECUBITAL Performed at [facility] [address], [place], [place] [address]    Special Requests   Final    BOTTLES DRAWN AEROBIC AND ANAEROBIC Blood Culture adequate volume Performed at [facility] [address] [place], [place] [address]    Culture   Final    NO GROWTH < 24 HOURS Performed at [facility] [address] [place], [place] [address]    Report Status PENDING  Incomplete     Radiology Studies: No results found.   Marzetta Board, MD, PhD [facility]  Between 7 am - 7 pm I am available, please contact me via Amion (for emergencies) or Securechat (non urgent messages)  Between 7 pm - 7 am I am not available, please contact  night coverage MD/APP via Amion

## 2021-08-30 NOTE — Progress Notes (Signed)
   08/30/21 1219  Assess: MEWS Score  Temp 98.5 F (36.9 C)  BP (!) 83/39  MAP (mmHg) (!) 54  Pulse Rate 69  Resp 20  SpO2 97 %  O2 Device Nasal Cannula  O2 Flow Rate (L/min) 2 L/min  Assess: MEWS Score  MEWS Temp 0  MEWS Systolic 1  MEWS Pulse 0  MEWS RR 0  MEWS LOC 1  MEWS Score 2  MEWS Score Color Yellow  Assess: if the MEWS score is Yellow or Red  Were vital signs taken at a resting state? Yes  Focused Assessment No change from prior assessment  Does the patient meet 2 or more of the SIRS criteria? No  MEWS guidelines implemented *See Row Information* Yes  Treat  Pain Scale 0-10  Pain Score 0  Escalate  MEWS: Escalate Yellow: discuss with charge nurse/RN and consider discussing with provider and RRT  Notify: Charge Nurse/RN  Name of Charge Nurse/RN Notified Abigail Butts, RN  Date Charge Nurse/RN Notified 08/30/21  Time Charge Nurse/RN Notified 1219  Notify: Provider  Provider Name/Title Gherghe  Date Provider Notified 08/30/21  Time Provider Notified 1225  Method of Notification  (epic)  Notification Reason Other (Comment) (yellow mews)  Provider response En route  Date of Provider Response 08/30/21  Time of Provider Response 1230  Assess: SIRS CRITERIA  SIRS Temperature  0  SIRS Pulse 0  SIRS Respirations  0  SIRS WBC 1  SIRS Score Sum  1

## 2021-08-30 NOTE — Transfer of Care (Signed)
Immediate Anesthesia Transfer of Care Note  Patient: [name] [name]  Procedure(s) Performed: LYMPH NODE BIOPSY (Left: Groin)  Patient Location: PACU  Anesthesia Type:General  Level of Consciousness: awake, alert , oriented and patient cooperative  Airway & Oxygen Therapy: Patient Spontanous Breathing and Patient connected to face mask oxygen  Post-op Assessment: Report given to RN, Post -op Vital signs reviewed and stable and Patient moving all extremities  Post vital signs: Reviewed and stable  Last Vitals:  Vitals Value Taken Time  BP 98/58 08/30/21 1109  Temp    Pulse 79 08/30/21 1111  Resp 19 08/30/21 1111  SpO2 100 % 08/30/21 1111  Vitals shown include unvalidated device data.  Last Pain:  Vitals:   08/30/21 0900  TempSrc:   PainSc: Asleep         Complications: No notable events documented.

## 2021-08-30 NOTE — Evaluation (Signed)
Physical Therapy Evaluation Patient Details Name: [name] [name] MRN: [id number] DOB: [birth year] Today's Date: 08/30/2021  History of Present Illness  Pt is 76 yo female admitted 08/28/21 with generalized weakness, poor appetite, nightly chills, weight loss.  Pt underwent bone marrow biopsy that was inconclusive and was eventually admitted from cancern center for lymph node biopsy - concern for probable lymphoma.  Lymph node biopsy 08/30/21.  Pt with hx afib, HTN, HLD, hypothyroidism  Clinical Impression  Pt admitted with above diagnosis. Normally, pt is independent and able to ambulate in community without AD.  She has been weakening over the past few weeks.  Pt does have rollator at home and supportive family.  Lives with spouse who can assist and reports daughter next door.  Today, pt requiring supervision to min guard for safety (BP soft but stable, hgb 6.8 and pt to receive PRBC).  She ambulated 54' with RW.  Pt expected to progress well.  She denied the need for HHPT and aware that if that changes after d/c could always get order from PCP for further therapy. Pt currently with functional limitations due to the deficits listed below (see PT Problem List). Pt will benefit from skilled PT to increase their independence and safety with mobility to allow discharge to the venue listed below.          Recommendations for follow up therapy are one component of a multi-disciplinary discharge planning process, led by the attending physician.  Recommendations may be updated based on patient status, additional functional criteria and insurance authorization.  Follow Up Recommendations No PT follow up (pt denied need for HHPT)      Assistance Recommended at Discharge Intermittent Supervision/Assistance  Patient can return home with the following  Assistance with cooking/housework;Help with stairs or ramp for entrance;A little help with bathing/dressing/bathroom;A little help with walking and/or transfers     Equipment Recommendations None recommended by PT  Recommendations for Other Services       Functional Status Assessment Patient has had a recent decline in their functional status and demonstrates the ability to make significant improvements in function in a reasonable and predictable amount of time.     Precautions / Restrictions Precautions Precautions: Fall      Mobility  Bed Mobility Overal bed mobility: Needs Assistance Bed Mobility: Supine to Sit     Supine to sit: Supervision          Transfers Overall transfer level: Needs assistance Equipment used: Rolling [place] (2 wheels) Transfers: Sit to/from Stand Sit to Stand: Min guard                Ambulation/Gait Ambulation/Gait assistance: Min guard Gait Distance (Feet): 70 Feet Assistive device: Rolling Deady (2 wheels) Gait Pattern/deviations: Step-to pattern, Decreased stride length Gait velocity: decreased     General Gait Details: Min guard for safety; cues for [employer]            Wheelchair Mobility    Modified Rankin (Stroke Patients Only)       Balance Overall balance assessment: Needs assistance Sitting-balance support: No upper extremity supported Sitting balance-Leahy Scale: Good     Standing balance support: Bilateral upper extremity supported, No upper extremity supported Standing balance-Leahy Scale: Fair Standing balance comment: RW to ambulate but could static stand without support                             Pertinent Vitals/Pain Pain Assessment  Pain Assessment: No/denies pain    Home Living Family/patient expects to be discharged to:: Private residence Living Arrangements: Spouse/significant other Available Help at Discharge: Family;Available 24 hours/day Type of Home: House Home Access: Ramped entrance   Entrance Stairs-Number of Steps: 2   Home Layout: Two level;Able to live on main level with bedroom/bathroom Home Equipment: Rolling Resetar  (2 wheels);Cane - single point;Shower seat;BSC/3in1;Rollator (4 wheels)      Prior Function               Mobility Comments: Normally does not use AD and could ambulate in community but has been struggling last 3-4 weeks and using rollator ADLs Comments: Normally can do all ADLs (struggling last 3-4 weeks)     Hand Dominance        Extremity/Trunk Assessment   Upper Extremity Assessment Upper Extremity Assessment: Overall WFL for tasks assessed    Lower Extremity Assessment Lower Extremity Assessment: Overall WFL for tasks assessed    Cervical / Trunk Assessment Cervical / Trunk Assessment: Normal  Communication   Communication: No difficulties  Cognition Arousal/Alertness: Awake/alert Behavior During Therapy: WFL for tasks assessed/performed Overall Cognitive Status: Within Functional Limits for tasks assessed                                          General Comments      Exercises     Assessment/Plan    PT Assessment Patient needs continued PT services  PT Problem List Decreased strength;Decreased mobility;Decreased knowledge of use of DME;Cardiopulmonary status limiting activity;Decreased activity tolerance       PT Treatment Interventions DME instruction;Therapeutic activities;Gait training;Therapeutic exercise;Patient/family education;Stair training;Balance training;Functional mobility training    PT Goals (Current goals can be found in the Care Plan section)  Acute Rehab PT Goals Patient Stated Goal: return home PT Goal Formulation: With patient/family Time For Goal Achievement: 09/13/21 Potential to Achieve Goals: Good    Frequency Min 3X/week     Co-evaluation               AM-PAC PT "6 Clicks" Mobility  Outcome Measure Help needed turning from your back to your side while in a flat bed without using bedrails?: None Help needed moving from lying on your back to sitting on the side of a flat bed without using  bedrails?: None Help needed moving to and from a bed to a chair (including a wheelchair)?: A Little Help needed standing up from a chair using your arms (e.g., wheelchair or bedside chair)?: A Little Help needed to walk in hospital room?: A Little Help needed climbing 3-5 steps with a railing? : A Little 6 Click Score: 20    End of Session Equipment Utilized During Treatment: Gait belt Activity Tolerance: Patient tolerated treatment well Patient left: in bed;with call bell/phone within reach;with family/visitor present (sitting EOB) Nurse Communication: Mobility status PT Visit Diagnosis: Other abnormalities of gait and mobility (R26.89);Muscle weakness (generalized) (M62.81)    Time: 3545-6256 PT Time Calculation (min) (ACUTE ONLY): 23 min   Charges:   PT Evaluation $PT Eval Low Complexity: 1 Low PT Treatments $Gait Training: 8-22 mins        Abran Richard, PT Acute Rehab [facility] [phone]   Karlton Lemon 08/30/2021, 3:04 PM

## 2021-08-30 NOTE — Progress Notes (Signed)
   08/30/21 1750  Assess: MEWS Score  Temp 97.7 F (36.5 C)  BP (!) 95/48  MAP (mmHg) (!) 63  Pulse Rate 63  Resp 18  SpO2 94 %  O2 Device Room Air  Assess: MEWS Score  MEWS Temp 0  MEWS Systolic 1  MEWS Pulse 0  MEWS RR 0  MEWS LOC 1  MEWS Score 2  MEWS Score Color Yellow  Assess: if the MEWS score is Yellow or Red  Were vital signs taken at a resting state? Yes  Focused Assessment No change from prior assessment  Does the patient meet 2 or more of the SIRS criteria? No  MEWS guidelines implemented *See Row Information* No, previously yellow, continue vital signs every 4 hours  Assess: SIRS CRITERIA  SIRS Temperature  0  SIRS Pulse 0  SIRS Respirations  0  SIRS WBC 1  SIRS Score Sum  1

## 2021-08-30 NOTE — Op Note (Signed)
08/28/2021 - 08/30/2021  10:58 AM  PATIENT:  [name] [name]  76 y.o. female  PRE-OPERATIVE DIAGNOSIS:  possible lymphoma  POST-OPERATIVE DIAGNOSIS:  possible lymphoma  PROCEDURE:  Procedure(s): LYMPH NODE BIOPSY LEFT GROIN  SURGEON:  Surgeon(s) and Role:    * Jovita Kussmaul, MD - Primary  PHYSICIAN ASSISTANT:   ASSISTANTS: none   ANESTHESIA:   local and general  EBL:  minimal   BLOOD ADMINISTERED:none  DRAINS: none   LOCAL MEDICATIONS USED:  MARCAINE     SPECIMEN:  Source of Specimen:  left groin lymph node  DISPOSITION OF SPECIMEN:  PATHOLOGY  COUNTS:  YES  TOURNIQUET:  * No tourniquets in log *  DICTATION: .Dragon Dictation  After informed consent was obtained the patient was brought to the operating room and placed in the supine position on the operating table.  After adequate induction of general anesthesia the patient's left groin was prepped with ChloraPrep, allowed to dry, and draped in usual sterile manner.  An appropriate timeout was performed.  The left groin was then infiltrated with quarter percent Marcaine.  A small vertically oriented incision was made with a 15 blade knife overlying a palpable lymph node.  The incision was carried through the skin and subcutaneous tissue sharply with the electrocautery until I could identify the lymph node.  The lymph node was then excised sharply with the harmonic scalpel.  The lymph nodes that were removed were sent to pathology for lymphoma protocol.  Hemostasis was achieved using the Bovie electrocautery.  The deep layer of the incision was then closed with interrupted 3-0 Vicryl stitches.  The skin was then closed with interrupted 4-0 Monocryl subcuticular stitches.  Dermabond dressings were applied.  The patient tolerated the procedure well.  At the end of the case all needle sponge and instrument counts were correct.  The patient was then awakened and taken to recovery in stable condition.  PLAN OF CARE: Admit to inpatient    PATIENT DISPOSITION:  PACU - hemodynamically stable.   Delay start of Pharmacological VTE agent (>24hrs) due to surgical blood loss or risk of bleeding: no

## 2021-08-30 NOTE — Progress Notes (Signed)
Day of Surgery   Subjective/Chief Complaint: Still doesn't feel well   Objective: Vital signs in last 24 hours: Temp:  [98.1 F (36.7 C)-100.1 F (37.8 C)] 100.1 F (37.8 C) (08/19 0356) Pulse Rate:  [66-84] 84 (08/19 0356) Resp:  [16] 16 (08/19 0356) BP: (93-107)/(41-43) 107/43 (08/19 0356) SpO2:  [91 %-98 %] 91 % (08/19 0356) Last BM Date : 08/29/21  Intake/Output from previous day: 08/18 0701 - 08/19 0700 In: 2631.5 [P.O.:240; I.V.:2362; IV Piggyback:29.5] Out: 601 [Urine:601] Intake/Output this shift: Total I/O In: 810 [I.V.:810] Out: -   General appearance: alert and cooperative Resp: clear to auscultation bilaterally Cardio: regular rate and rhythm GI: soft, nontender  Lab Results:  Recent Labs    08/29/21 0540 08/29/21 0806 08/30/21 0543  WBC 2.2*  --  1.8*  HGB 7.2*  --  6.8*  HCT 23.5*  --  22.6*  PLT 70* 70* 64*   BMET Recent Labs    08/29/21 0540 08/30/21 0543  NA 140 139  K 4.4 4.0  CL 111 113*  CO2 22 22  GLUCOSE 87 93  BUN 41* 35*  CREATININE 2.56* 2.07*  CALCIUM 8.2* 7.8*   PT/INR Recent Labs    08/29/21 0806 08/30/21 0543  LABPROT 25.0* 19.2*  INR 2.3* 1.6*   ABG No results for input(s): "PHART", "HCO3" in the last 72 hours.  Invalid input(s): "PCO2", "PO2"  Studies/Results: No results found.  Anti-infectives: Anti-infectives (From admission, onward)    None       Assessment/Plan: s/p Procedure(s): LYMPH NODE BIOPSY (Left) npo Plan for left groin lymph node biopsy today in OR Risks and benefits of the surgery discussed with pt and family and they understand and wish to proceed  LOS: 2 days    Autumn Messing III 08/30/2021

## 2021-08-30 NOTE — Progress Notes (Signed)
PT Cancellation Note  Patient Details Name: [name] [name] MRN: [id number] DOB: 1945/12/17   Cancelled Treatment:    Reason Eval/Treat Not Completed: Patient at procedure or test/unavailable Pt to OR for biopsy, will f/u as able. Abran Richard, PT Acute Rehab [facility] [phone]   Karlton Lemon 08/30/2021, 11:38 AM

## 2021-08-30 NOTE — Anesthesia Procedure Notes (Signed)
Procedure Name: LMA Insertion Date/Time: 08/30/2021 10:22 AM  Performed by: Victoriano Lain, CRNAPre-anesthesia Checklist: Patient identified, Emergency Drugs available, Suction available, Patient being monitored and Timeout performed Patient Re-evaluated:Patient Re-evaluated prior to induction Oxygen Delivery Method: Circle system utilized Preoxygenation: Pre-oxygenation with 100% oxygen Induction Type: IV induction LMA: LMA with gastric port inserted LMA Size: 4.0 Number of attempts: 2 Placement Confirmation: positive ETCO2 and breath sounds checked- equal and bilateral Tube secured with: Tape Dental Injury: Teeth and Oropharynx as per pre-operative assessment

## 2021-08-30 NOTE — Progress Notes (Signed)
   08/30/21 0720  Provider Notification  Provider Name/Title Gherghe  Date Provider Notified 08/30/21  Time Provider Notified 0720  Method of Notification  (epic)  Notification Reason Critical result  Test performed and critical result hgb 6.8  Date Critical Result Received 08/30/21  Time Critical Result Received 0715  Provider response En route  Date of Provider Response 08/30/21  Time of Provider Response [phone]

## 2021-08-31 ENCOUNTER — Other Ambulatory Visit: Payer: Self-pay

## 2021-08-31 DIAGNOSIS — R531 Weakness: Secondary | ICD-10-CM | POA: Diagnosis not present

## 2021-08-31 LAB — PHOSPHORUS: Phosphorus: 3.1 mg/dL (ref 2.5–4.6)

## 2021-08-31 LAB — MAGNESIUM: Magnesium: 2 mg/dL (ref 1.7–2.4)

## 2021-08-31 MED ORDER — APIXABAN 5 MG PO TABS
5.0000 mg | ORAL_TABLET | Freq: Two times a day (BID) | ORAL | Status: DC
Start: 2021-08-31 — End: 2021-09-01
  Administered 2021-08-31 – 2021-09-01 (×2): 5 mg via ORAL
  Filled 2021-08-31 (×2): qty 1

## 2021-08-31 MED ORDER — HYDROCORTISONE (PERIANAL) 2.5 % EX CREA
1.0000 | TOPICAL_CREAM | Freq: Four times a day (QID) | CUTANEOUS | Status: DC | PRN
  Filled 2021-08-31: qty 28.35

## 2021-08-31 NOTE — Progress Notes (Addendum)
PROGRESS NOTE  [name] [name] MRN:[id number] DOB: 11/05/1945 DOA: 08/28/2021 PCP: White, Cynthia, MD   LOS: 3 days   Brief Narrative / Interim history: 76 y.o. female with medical history significant of paroxysmal A-fib, hypertension, hyperlipidemia, hypothyroidism, who comes into the hospital with complaints of generalized weakness.  Patient has been having increased weakness, poor appetite, nightly chills over the last year and a half.  She also had an associated 40 pound weight loss, and has gotten from being fully independent to urinate after go to now being two-person assist and barely able to ambulate a few steps with a [place]. She underwent a bone marrow biopsy which was suspicion but not necessarily conclusive. She was eventually admitted from the cancer center for lymph node biopsy and initiation of treatment  Subjective / 24h Interval events: Feeling better this morning, appears in better spirits.  No chest pain, no shortness of breath  Assesement and Plan: Principal Problem:   Generalized weakness Active Problems:   PAF (paroxysmal atrial fibrillation) (HCC)   Essential hypertension   (HFpEF) heart failure with preserved ejection fraction (HCC)   Hypothyroidism   Pancytopenia (HCC)  Principal problem Probable lymphoma-General surgery consulted, underwent lymph node biopsy on 8/19.  Pathology pending.  She was started on prednisone per oncology recommendations following the biopsy.  Discussed with cardiology, can resume Eliquis today  Active problems Paroxysmal A-fib-currently appears in sinus.  Continue amiodarone, metoprolol.  Resume Eliquis tonight   Essential hypertension-resume home medications   Hypothyroidism-continue Synthroid.  TSH 0.2   Hyperlipidemia-continue statin   Pancytopenia-likely due to underlying lymphoma.  Monitor counts, hemoglobin was less than 7 on 8/19, she is status post unit of packed red blood cells   Generalized weakness, weight loss,  FTT-significant amount of weight loss in the last year and a half, now extremely weak and deconditioned requiring 2+ person assist.  Improved today, suspect prednisone is helping, was able to ambulate to the bathroom alone   Anxiety- continue Xanax as needed  Scheduled Meds:  sodium chloride   Intravenous Once   amiodarone  200 mg Oral Daily   feeding supplement  237 mL Oral TID BM   levothyroxine  75 mcg Oral QAC breakfast   metoprolol succinate  25 mg Oral Daily   multivitamin with minerals  1 tablet Oral Daily   pravastatin  40 mg Oral QHS   predniSONE  60 mg Oral Q breakfast   Continuous Infusions:  sodium chloride 150 mL/hr at 08/31/21 0528   PRN Meds:.acetaminophen **OR** acetaminophen, albuterol, ALPRAZolam, bisacodyl, hydrocortisone, ondansetron **OR** ondansetron (ZOFRAN) IV, oxyCODONE  Diet Orders (From admission, onward)     Start     Ordered   08/30/21 1154  Diet regular Room service appropriate? Yes; Fluid consistency: Thin  Diet effective now       Question Answer Comment  Room service appropriate? Yes   Fluid consistency: Thin      08/30/21 1153            DVT prophylaxis: SCDs Start: 08/28/21 1743   Lab Results  Component Value Date   PLT 64 (L) 08/30/2021      Code Status: DNR  Family Communication: Husband at bedside  Status is: Inpatient Remains inpatient appropriate because: severe weakness, awaiting biopsy   Level of care: Med-Surg  Consultants:  Oncology  Surgery   Objective: Vitals:   08/30/21 1750 08/30/21 2104 08/30/21 2216 08/31/21 0525  BP: (!) 95/48 (!) 89/43 (!) 99/51 (!) 98/46  Pulse: 63 67   67 69  Resp: _0 Temp: 97.7 F (36.5 C) 97.6 F (36.4 C)    TempSrc: Oral Oral    SpO2: 94% 93%  100%    Intake/Output Summary (Last 24 hours) at 08/31/2021 1237 Last data filed at 08/31/2021 0950 Gross per 24 hour  Intake 1339.38 ml  Output --  Net 1339.38 ml    Wt Readings from Last 3 Encounters:  08/28/21 79.8 kg   08/22/21 82.1 kg  08/13/21 84.4 kg    Examination:  Constitutional: NAD Eyes: lids and conjunctivae normal, no scleral icterus ENMT: mmm Neck: normal, supple Respiratory: clear to auscultation bilaterally, no wheezing, no crackles. Normal respiratory effort.  Cardiovascular: Regular rate and rhythm, no murmurs / rubs / gallops. No LE edema. Abdomen: soft, no distention, no tenderness. Bowel sounds positive.  Skin: no rashes Neurologic: no focal deficits, equal strength   Data Reviewed: I have independently reviewed following labs and imaging studies   CBC Recent Labs  Lab 08/29/21 0540 08/29/21 0806 08/30/21 0543 08/30/21 2028  WBC 2.2*  --  1.8*  --   HGB 7.2*  --  6.8* 8.3*  HCT 23.5*  --  22.6* 27.6*  PLT 70* 70* 64*  --   MCV 96.7  --  98.3  --   MCH 29.6  --  29.6  --   MCHC 30.6  --  30.1  --   RDW 18.8*  --  19.2*  --      Recent Labs  Lab 08/29/21 0540 08/29/21 0806 08/30/21 0543 08/31/21 0541  NA 140  --  139  --   K 4.4  --  4.0  --   CL 111  --  113*  --   CO2 22  --  22  --   GLUCOSE 87  --  93  --   BUN 41*  --  35*  --   CREATININE 2.56*  --  2.07*  --   CALCIUM 8.2*  --  7.8*  --   AST 18  --  19  --   ALT 9  --  9  --   ALKPHOS 42  --  39  --   BILITOT 0.9  --  0.7  --   ALBUMIN 2.2*  --  2.0*  --   MG  --   --  1.9 2.0  DDIMER  --  <0.27  --   --   INR 2.4* 2.3* 1.6*  --   TSH 0.214*  --   --   --      ------------------------------------------------------------------------------------------------------------------ No results for input(s): "CHOL", "HDL", "LDLCALC", "TRIG", "CHOLHDL", "LDLDIRECT" in the last 72 hours.  Lab Results  Component Value Date   HGBA1C 5.8 (H) 09/25/2016   ------------------------------------------------------------------------------------------------------------------ Recent Labs    08/29/21 0540  TSH 0.214*     Cardiac Enzymes No results for input(s): "CKMB", "TROPONINI", "MYOGLOBIN" in the  last 168 hours.  Invalid input(s): "CK" ------------------------------------------------------------------------------------------------------------------    Component Value Date/Time   BNP 150.2 (H) 02/17/2016 1644    CBG: Recent Labs  Lab 08/27/21 1436 08/29/21 1148  GLUCAP 99 109*     Recent Results (from the past 240 hour(s))  Culture, blood (Routine X 2) w Reflex to ID Panel     Status: None (Preliminary result)   Collection Time: 08/29/21  9:11 AM   Specimen: BLOOD  Result Value Ref Range Status   Specimen Description   Final    BLOOD  LEFT ANTECUBITAL Performed at [facility] [address], [place], [place] [address]    Special Requests   Final    BOTTLES DRAWN AEROBIC AND ANAEROBIC Blood Culture adequate volume Performed at [facility] [address] [place], [place] [address]    Culture   Final    NO GROWTH 2 DAYS Performed at [facility] [address] [place], [place] [address]    Report Status PENDING  Incomplete  Culture, blood (Routine X 2) w Reflex to ID Panel     Status: None (Preliminary result)   Collection Time: 08/29/21  9:27 AM   Specimen: BLOOD  Result Value Ref Range Status   Specimen Description   Final    BLOOD RIGHT ANTECUBITAL Performed at [facility] [address], [place], [place] [address]    Special Requests   Final    BOTTLES DRAWN AEROBIC AND ANAEROBIC Blood Culture adequate volume Performed at [facility] [address] [place], [place] [address]    Culture   Final    NO GROWTH 2 DAYS Performed at [facility] [address] [place], [place] [address]    Report Status PENDING  Incomplete     Radiology Studies: No results found.   Marzetta Board, MD, PhD [facility]  Between 7 am - 7 pm I am available, please contact me via Amion (for emergencies) or Securechat (non urgent messages)  Between 7 pm - 7 am I am not  available, please contact night coverage MD/APP via Amion

## 2021-09-01 ENCOUNTER — Encounter: Payer: Self-pay | Admitting: *Deleted

## 2021-09-01 ENCOUNTER — Other Ambulatory Visit: Payer: Self-pay | Admitting: Internal Medicine

## 2021-09-01 ENCOUNTER — Other Ambulatory Visit (HOSPITAL_COMMUNITY): Payer: Medicare HMO

## 2021-09-01 ENCOUNTER — Encounter (HOSPITAL_COMMUNITY): Payer: Self-pay | Admitting: General Surgery

## 2021-09-01 DIAGNOSIS — E032 Hypothyroidism due to medicaments and other exogenous substances: Secondary | ICD-10-CM

## 2021-09-01 DIAGNOSIS — R531 Weakness: Secondary | ICD-10-CM | POA: Diagnosis not present

## 2021-09-01 LAB — BPAM RBC
Blood Product Expiration Date: 202309062359
ISSUE DATE / TIME: 202308191519
Unit Type and Rh: 6200

## 2021-09-01 LAB — COMPREHENSIVE METABOLIC PANEL
ALT: 12 U/L (ref 0–44)
AST: 21 U/L (ref 15–41)
Albumin: 2 g/dL — ABNORMAL LOW (ref 3.5–5.0)
Alkaline Phosphatase: 45 U/L (ref 38–126)
Anion gap: 3 — ABNORMAL LOW (ref 5–15)
BUN: 27 mg/dL — ABNORMAL HIGH (ref 8–23)
CO2: 18 mmol/L — ABNORMAL LOW (ref 22–32)
Calcium: 8.2 mg/dL — ABNORMAL LOW (ref 8.9–10.3)
Chloride: 120 mmol/L — ABNORMAL HIGH (ref 98–111)
Creatinine, Ser: 1.5 mg/dL — ABNORMAL HIGH (ref 0.44–1.00)
GFR, Estimated: 36 mL/min — ABNORMAL LOW (ref >60)
Glucose, Bld: 164 mg/dL — ABNORMAL HIGH (ref 70–99)
Potassium: 3.8 mmol/L (ref 3.5–5.1)
Sodium: 141 mmol/L (ref 135–145)
Total Bilirubin: 0.6 mg/dL (ref 0.3–1.2)
Total Protein: 4.2 g/dL — ABNORMAL LOW (ref 6.5–8.1)

## 2021-09-01 LAB — CBC
HCT: 26.5 % — ABNORMAL LOW (ref 36.0–46.0)
Hemoglobin: 7.8 g/dL — ABNORMAL LOW (ref 12.0–15.0)
MCH: 28.9 pg (ref 26.0–34.0)
MCHC: 29.4 g/dL — ABNORMAL LOW (ref 30.0–36.0)
MCV: 98.1 fL (ref 80.0–100.0)
Platelets: 67 10*3/uL — ABNORMAL LOW (ref 150–400)
RBC: 2.7 MIL/uL — ABNORMAL LOW (ref 3.87–5.11)
RDW: 19.9 % — ABNORMAL HIGH (ref 11.5–15.5)
WBC: 2.5 10*3/uL — ABNORMAL LOW (ref 4.0–10.5)
nRBC: 0 % (ref 0.0–0.2)

## 2021-09-01 LAB — TYPE AND SCREEN
ABO/RH(D): A POS
Antibody Screen: NEGATIVE
Unit division: 0

## 2021-09-01 LAB — PHOSPHORUS: Phosphorus: 3 mg/dL (ref 2.5–4.6)

## 2021-09-01 LAB — MAGNESIUM: Magnesium: 2 mg/dL (ref 1.7–2.4)

## 2021-09-01 MED ORDER — FUROSEMIDE 20 MG PO TABS: 20.0000 mg | ORAL_TABLET | Freq: Every day | ORAL | 3 refills | Status: DC | PRN

## 2021-09-01 MED ORDER — POTASSIUM CHLORIDE CRYS ER 20 MEQ PO TBCR: 20.0000 meq | EXTENDED_RELEASE_TABLET | Freq: Every day | ORAL | 2 refills | Status: DC | PRN

## 2021-09-01 MED ORDER — PREDNISONE 20 MG PO TABS
40.0000 mg | ORAL_TABLET | Freq: Every day | ORAL | Status: DC
  Administered 2021-09-01: 40 mg via ORAL
  Filled 2021-09-01: qty 2

## 2021-09-01 MED ORDER — PREDNISONE 20 MG PO TABS: 40.0000 mg | ORAL_TABLET | Freq: Every day | ORAL | 0 refills | Status: DC

## 2021-09-01 NOTE — Care Management Important Message (Signed)
Important Message  Patient Details IM Letter given to the Patient. Name: [name] [name] MRN: [id number] Date of Birth: 09-Sep-1945   Medicare Important Message Given:  Yes     Kerin Salen 09/01/2021, 11:06 AM

## 2021-09-01 NOTE — Plan of Care (Signed)

## 2021-09-01 NOTE — Discharge Summary (Signed)
Physician Discharge Summary  [name] [name]:[id number] DOB: 19-Jul-1945 DOA: 08/28/2021  PCP: [name] Stains, MD  Admit date: 08/28/2021 Discharge date: 09/01/2021  Admitted From: home Disposition:  home  Recommendations for Outpatient Follow-up:  Follow up with Oncology in 1 day as scheduled  Home Health: none Equipment/Devices: none  Discharge Condition: stable CODE STATUS: DNR Diet Orders (From admission, onward)     Start     Ordered   08/30/21 1154  Diet regular Room service appropriate? Yes; Fluid consistency: Thin  Diet effective now       Question Answer Comment  Room service appropriate? Yes   Fluid consistency: Thin      08/30/21 1153            HPI: Per admitting MD, [name] [name] is a 76 y.o. female with medical history significant of paroxysmal A-fib, hypertension, hyperlipidemia, hypothyroidism, who comes into the hospital with complaints of generalized weakness.  Patient has been having increased weakness, poor appetite, nightly chills over the last year and a half.  She also had an associated 40 pound weight loss, and has gotten from being fully independent to urinate after go to now being two-person assist and barely able to ambulate a few steps with a [place].  She eventually saw oncology, and was suspected to have lymphoma.  She underwent a bone marrow biopsy which was suspicion but not necessarily conclusive.  She was seen today in the cancer center, and given progressive weakness, persistent symptoms she was directed to be admitted to the hospital.  She denies any fevers.  No cough or chest congestion, no dysuria.  No nausea, vomiting.  No chest pain, but does have intermittent shortness of breath.  No focal weakness, no numbness or tingling.  Hospital Course / Discharge diagnoses: Principal Problem:   Generalized weakness Active Problems:   PAF (paroxysmal atrial fibrillation) (HCC)   Essential hypertension   (HFpEF) heart failure with preserved  ejection fraction (HCC)   Hypothyroidism   Pancytopenia (HCC)   Principal problem Probable lymphoma-patient was admitted to the hospital from oncology clinic due to failure to thrive, weakness, dehydration, acute kidney injury in the setting of underlying probable lymphoma given indolent symptoms as well as positive PET scan.  General surgery consulted, underwent lymph node biopsy on 8/19.  She recovered well, following the biopsy she was started on prednisone with significant improvement in her energy levels and appetite.  Discussed with Dr. Benay Spice with oncology, results from the biopsy are not available yet, given patient's improvement she will be discharged home in stable condition and will have close outpatient follow-up in the oncology clinic the day following the discharge.   Active problems Paroxysmal A-fib-currently appears in sinus.  Continue amiodarone, metoprolol and Eliquis Acute kidney injury on chronic kidney disease stage IIIa-baseline creatinine ranging between 1.3-1.6, creatinine on admission was 2.5.  She received IV fluids, improved and has returned to baseline, creatinine is 1.5 on discharge.  Given dehydration/acute kidney injury her Lasix will be changed from daily to PRN Essential hypertension-continue metoprolol, hold daily Lasix and change to PRN Hypothyroidism-continue Synthroid.  TSH 0.2, recheck TSH in 3 to 4 weeks Hyperlipidemia-continue statin Pancytopenia-likely due to underlying lymphoma.  Monitor counts, hemoglobin was less than 7 on 8/19, she is status post unit of packed red blood cells.  Continue to monitor counts on an outpatient basis Generalized weakness, weight loss, FTT-improved after initiation of prednisone  Anxiety- continue Xanax as needed  Sepsis ruled out   Discharge  Instructions   Allergies as of 09/01/2021       Reactions   Omnicef [cefdinir] Swelling   Tongue swelling   Bacitracin    Rash   Bacitracin-polymyxin B    Rash   Duloxetine  Hcl    felt funny- thinking messed up   Lexapro [escitalopram]    Makes her feel funny and sleepy   Mirtazapine    weight gain in high doses    Penicillins Swelling   Tongue swell   Peroxide [hydrogen Peroxide] Other (See Comments)   Redness.    Zoloft [sertraline Hcl]    Makes her feel funny   Neosporin [neomycin-bacitracin Zn-polymyx] Rash   Blisters, itching.         Medication List     TAKE these medications    acetaminophen 500 MG tablet Commonly known as: TYLENOL Take 1,000 mg by mouth every 6 (six) hours as needed for moderate pain.   AeroChamber MV inhaler Use as instructed   albuterol 108 (90 Base) MCG/ACT inhaler Commonly known as: Ventolin HFA Inhale 1-2 puffs into the lungs every 4 (four) hours as needed for wheezing or shortness of breath.   ALPRAZolam 0.5 MG tablet Commonly known as: XANAX Take 1 tablet (0.5 mg total) by mouth 3 (three) times daily as needed for anxiety.   amiodarone 200 MG tablet Commonly known as: PACERONE Taking one tablet by mouth daily What changed:  how much to take how to take this when to take this additional instructions   apixaban 5 MG Tabs tablet Commonly known as: Eliquis Take 1 tablet (5 mg total) by mouth 2 (two) times daily.   B-12 2000 MCG Tabs Take 2,000 mcg by mouth daily.   benzonatate 200 MG capsule Commonly known as: TESSALON Take 1 capsule by mouth three times daily as needed for cough   buPROPion 300 MG 24 hr tablet Commonly known as: WELLBUTRIN XL Take 300 mg by mouth daily.   CALCIUM 500 + D PO Take 1 tablet by mouth daily.   Co Q-10 100 MG Caps Take 100 mg by mouth daily.   doxycycline 100 MG capsule Commonly known as: VIBRAMYCIN Take 100 mg by mouth daily.   famotidine 20 MG tablet Commonly known as: PEPCID Take 20 mg by mouth daily as needed for heartburn or indigestion.   fexofenadine 180 MG tablet Commonly known as: ALLEGRA Take 180 mg by mouth daily.   furosemide 20 MG  tablet Commonly known as: LASIX Take 1 tablet (20 mg total) by mouth daily as needed for edema or fluid. What changed:  when to take this reasons to take this   levothyroxine 75 MCG tablet Commonly known as: SYNTHROID TAKE 1 TABLET DAILY BEFORE BREAKFAST (NEED APPOINTMENT FOR FURTHER REFILLS) What changed: See the new instructions.   Magnesium 250 MG Tabs Take 250 mg by mouth daily.   metoprolol succinate 25 MG 24 hr tablet Commonly known as: TOPROL-XL Take 1 tablet (25 mg total) by mouth daily.   mirtazapine 30 MG tablet Commonly known as: REMERON Take 1 tablet by mouth at bedtime.   potassium chloride SA 20 MEQ tablet Commonly known as: KLOR-CON M Take 1 tablet (20 mEq total) by mouth daily as needed (Take potassium only when you take the Furosemide (Lasix)). What changed:  when to take this reasons to take this   pravastatin 40 MG tablet Commonly known as: PRAVACHOL Take 40 mg by mouth at bedtime.   predniSONE 20 MG tablet Commonly known as: DELTASONE  Take 2 tablets (40 mg total) by mouth daily with breakfast. Start taking on: September 02, 2021   sodium chloride 0.65 % Soln nasal spray Commonly known as: OCEAN Place 1 spray into both nostrils daily as needed for congestion.   Vilazodone HCl 20 MG Tabs Take 1 tablet by mouth daily.   vitamin C with rose hips 1000 MG tablet Take 1,000 mg by mouth daily.         Consultations: Oncology General surgery   Procedures/Studies:  NM PET Image Initial (PI) Skull Base To Thigh  Result Date: 08/29/2021 CLINICAL DATA:  Initial treatment strategy for progressive pancytopenia. Severe fatigue. Anorexia with weight loss. Night sweats. EXAM: NUCLEAR MEDICINE PET SKULL BASE TO THIGH TECHNIQUE: 9.1 mCi F-18 FDG was injected intravenously. Full-ring PET imaging was performed from the skull base to thigh after the radiotracer. CT data was obtained and used for attenuation correction and anatomic localization. Fasting blood  glucose: 99 mg/dl COMPARISON:  02/17/2021 CT chest, abdomen and pelvis. FINDINGS: Mediastinal blood pool activity: SUV max 2.7 Liver activity: SUV max 4.3 NECK: Several small hypermetabolic bilateral neck lymph nodes. Representative left level 2 neck 0.5 cm node with max SUV 10.5 (series 4/image 34). Representative 0.6 cm right level 3 neck node with max SUV 13.8 (series 4/image 38). Incidental CT findings: None. CHEST: Hypermetabolic left axillary lymphadenopathy with dominant 1.0 cm left axillary node with max SUV 23.6 (series 4/image 59). No enlarged or hypermetabolic right axillary nodes. Several mildly enlarged hypermetabolic bilateral mediastinal lymph nodes. Representative 1.1 cm right paratracheal node with max SUV 14.2 (series 4/image 65). Representative 1.0 cm subcarinal node with max SUV 29.5 (series 4/image 75). Hypermetabolic left hilar adenopathy with max SUV 7.4. No hypermetabolic right hilar adenopathy. Generalized hypermetabolism throughout the bilateral lung fields with associated faint ground-glass opacity in both lungs with representative max SUV 5.1 in the left lower lobe. Incidental CT findings: Coronary atherosclerosis. Atherosclerotic nonaneurysmal thoracic aorta. No significant pulmonary nodules. ABDOMEN/PELVIS: Widespread hypermetabolic lymphadenopathy in the porta hepatis, portacaval, aortocaval, left para-aortic, mesenteric, left common iliac, left external iliac and left inguinal nodal chains. Representative 1.3 cm porta hepatis node with max SUV 13.0 (series 5/image 115). Representative 1.3 cm aortocaval node with max SUV 25.7 (series 4/image 141). Representative 1.5 cm left para-aortic node with max SUV 26.7 (series 4/image 138). Representative 1.0 cm right mesenteric node with max SUV 21.2 (series 4/image 145). Representative 1.3 cm left external iliac node with max SUV 15.8 (series 4/image 172). Representative 1.0 cm left inguinal node with max SUV 15.6 (series 4/image 187).  Mild-to-moderate splenomegaly. Craniocaudal splenic length 16.3 cm. Splenic FDG uptake is equivalent to the liver with max SUV 4.4. No focal splenic masses. No abnormal hypermetabolic activity within the liver, pancreas or adrenal glands. Incidental CT findings: Postsurgical changes from fundoplication in the proximal stomach. Atherosclerotic nonaneurysmal abdominal aorta. Hysterectomy. SKELETON: Multiple hypermetabolic skeletal lesions in the axial and visualized proximal appendicular skeleton. Representative right T11 vertebral/posterior element lesion with max SUV 15.4. Representative proximal left humerus metaphysis lesion with max SUV 5.0. Representative left mandibular body lesion with max SUV 6.7. Incidental CT findings: None. IMPRESSION: 1. Widespread intensely hypermetabolic lymphadenopathy in the neck, left axilla, mediastinum, retroperitoneum and left pelvis. Mild-to-moderate splenomegaly. Scattered hypermetabolic skeletal lesions. Generalized bilateral pulmonary hypermetabolism with associated faint ground-glass opacity. Findings are most compatible with lymphoproliferative disorder/lymphoma. Deauville score 5. 2. Chronic findings include: Aortic Atherosclerosis (ICD10-I70.0). Coronary atherosclerosis. Electronically Signed   By: Ilona Sorrel M.D.   On: 08/29/2021 10:09  IR BONE MARROW BIOPSY & ASPIRATION  Result Date: 08/13/2021 INDICATION: pancytopenia, sweats, splenomegaly, anorexia EXAM: Bone marrow aspiration and core biopsy using fluoroscopic guidance MEDICATIONS: None. ANESTHESIA/SEDATION: Moderate (conscious) sedation was employed during this procedure. A total of Versed 2 mg and Fentanyl 100 mcg was administered intravenously. Moderate Sedation Time: 10 minutes. The patient's level of consciousness and vital signs were monitored continuously by radiology nursing throughout the procedure under my direct supervision. FLUOROSCOPY TIME:  Fluoroscopy Time: 1.8 minutes (18 mGy) COMPLICATIONS: None  immediate. PROCEDURE: Informed written consent was obtained from the patient after a thorough discussion of the procedural risks, benefits and alternatives. All questions were addressed. Maximal Sterile Barrier Technique was utilized including caps, mask, sterile gowns, sterile gloves, sterile drape, hand hygiene and skin antiseptic. A timeout was performed prior to the initiation of the procedure. The patient was placed prone on the IR exam table. Limited fluoroscopy of the pelvis was performed for planning purposes. Skin entry site was marked, and the overlying skin was prepped and draped in the standard sterile fashion. Local analgesia was obtained with 1% lidocaine. Using fluoroscopic guidance, an 11 gauge needle was advanced just deep to the cortex of the right posterior ilium. Subsequently, bone marrow aspiration and core biopsy were performed. Specimens were submitted to lab/pathology for handling. Hemostasis was achieved with manual pressure, and a clean dressing was placed. The patient tolerated the procedure well without immediate complication. IMPRESSION: Successful bone marrow aspiration and core biopsy of the right posterior ilium using fluoroscopic guidance. Electronically Signed   By: Albin Felling M.D.   On: 08/13/2021 14:00     Subjective: - no chest pain, shortness of breath, no abdominal pain, nausea or vomiting.   Discharge Exam: BP (!) 108/44 (BP Location: Left Arm)   Pulse 77   Temp 98.7 F (37.1 C) (Oral)   Resp 18   SpO2 98%   General: Pt is alert, awake, not in acute distress Cardiovascular: RRR, S1/S2 +, no rubs, no gallops Respiratory: CTA bilaterally, no wheezing, no rhonchi Abdominal: Soft, NT, ND, bowel sounds + Extremities: no edema, no cyanosis    The results of significant diagnostics from this hospitalization (including imaging, microbiology, ancillary and laboratory) are listed below for reference.     Microbiology: Recent Results (from the past 240  hour(s))  Culture, blood (Routine X 2) w Reflex to ID Panel     Status: None (Preliminary result)   Collection Time: 08/29/21  9:11 AM   Specimen: BLOOD  Result Value Ref Range Status   Specimen Description   Final    BLOOD LEFT ANTECUBITAL Performed at [facility] [address], [place], [place] [address]    Special Requests   Final    BOTTLES DRAWN AEROBIC AND ANAEROBIC Blood Culture adequate volume Performed at [facility] [address], [place], [place] [address]    Culture   Final    NO GROWTH 3 DAYS Performed at [facility], [place] [address], [place], [place] [address]    Report Status PENDING  Incomplete  Culture, blood (Routine X 2) w Reflex to ID Panel     Status: None (Preliminary result)   Collection Time: 08/29/21  9:27 AM   Specimen: BLOOD  Result Value Ref Range Status   Specimen Description   Final    BLOOD RIGHT ANTECUBITAL Performed at [facility] [address] [place], [place] [address]    Special Requests   Final  BOTTLES DRAWN AEROBIC AND ANAEROBIC Blood Culture adequate volume Performed at [facility] [address], [place], [place] [address]    Culture   Final    NO GROWTH 3 DAYS Performed at [facility], [place] [address] [place], [place] [address]    Report Status PENDING  Incomplete     Labs: Basic Metabolic Panel: Recent Labs  Lab 08/29/21 0540 08/30/21 0543 08/31/21 0541 09/01/21 0519  NA 140 139  --  141  K 4.4 4.0  --  3.8  CL 111 113*  --  120*  CO2 22 22  --  18*  GLUCOSE 87 93  --  164*  BUN 41* 35*  --  27*  CREATININE 2.56* 2.07*  --  1.50*  CALCIUM 8.2* 7.8*  --  8.2*  MG  --  1.9 2.0 2.0  PHOS  --  3.3 3.1 3.0   Liver Function Tests: Recent Labs  Lab 08/29/21 0540 08/30/21 0543 09/01/21 0519  AST 18 19 21   ALT 9 9 12   ALKPHOS 42 39 45  BILITOT 0.9 0.7 0.6  PROT 4.6* 4.2* 4.2*  ALBUMIN 2.2* 2.0* 2.0*   CBC: Recent  Labs  Lab 08/29/21 0540 08/29/21 0806 08/30/21 0543 08/30/21 2028 09/01/21 0519  WBC 2.2*  --  1.8*  --  2.5*  HGB 7.2*  --  6.8* 8.3* 7.8*  HCT 23.5*  --  22.6* 27.6* 26.5*  MCV 96.7  --  98.3  --  98.1  PLT 70* 70* 64*  --  67*   CBG: Recent Labs  Lab 08/27/21 1436 08/29/21 1148  GLUCAP 99 109*   Hgb A1c No results for input(s): "HGBA1C" in the last 72 hours. Lipid Profile No results for input(s): "CHOL", "HDL", "LDLCALC", "TRIG", "CHOLHDL", "LDLDIRECT" in the last 72 hours. Thyroid function studies No results for input(s): "TSH", "T4TOTAL", "T3FREE", "THYROIDAB" in the last 72 hours.  Invalid input(s): "FREET3" Urinalysis    Component Value Date/Time   COLORURINE YELLOW 09/25/2016 [place] 09/25/2016 1148   LABSPEC 1.013 09/25/2016 1148   PHURINE 5.0 09/25/2016 1148   GLUCOSEU NEGATIVE 09/25/2016 1148   HGBUR NEGATIVE 09/25/2016 [place] 09/25/2016 1148   [place] 09/25/2016 1148   PROTEINUR NEGATIVE 09/25/2016 1148   NITRITE NEGATIVE 09/25/2016 1148   LEUKOCYTESUR TRACE (A) 09/25/2016 1148    FURTHER DISCHARGE INSTRUCTIONS:   Get Medicines reviewed and adjusted: Please take all your medications with you for your next visit with your Primary MD   Laboratory/radiological data: Please request your Primary MD to go over all hospital tests and procedure/radiological results at the follow up, please ask your Primary MD to get all Hospital records sent to his/her office.   In some cases, they will be blood work, cultures and biopsy results pending at the time of your discharge. Please request that your primary care M.D. goes through all the records of your hospital data and follows up on these results.   Also Note the following: If you experience worsening of your admission symptoms, develop shortness of breath, life threatening emergency, suicidal or homicidal thoughts you must seek medical attention immediately by  calling 911 or calling your MD immediately  if symptoms less severe.   You must read complete instructions/literature along with all the possible adverse reactions/side effects for all the Medicines you take and that have been prescribed to you. Take any new Medicines after you have completely understood and accpet  all the possible adverse reactions/side effects.    Do not drive when taking Pain medications or sleeping medications (Benzodaizepines)   Do not take more than prescribed Pain, Sleep and Anxiety Medications. It is not advisable to combine anxiety,sleep and pain medications without talking with your primary care practitioner   Special Instructions: If you have smoked or chewed Tobacco  in the last 2 yrs please stop smoking, stop any regular Alcohol  and or any Recreational drug use.   Wear Seat belts while driving.   Please note: You were cared for by a hospitalist during your hospital stay. Once you are discharged, your primary care physician will handle any further medical issues. Please note that NO REFILLS for any discharge medications will be authorized once you are discharged, as it is imperative that you return to your primary care physician (or establish a relationship with a primary care physician if you do not have one) for your post hospital discharge needs so that they can reassess your need for medications and monitor your lab values.  Time coordinating discharge: 40 minutes  SIGNED:  Marzetta Board, MD, PhD 09/01/2021, 12:52 PM

## 2021-09-01 NOTE — Anesthesia Postprocedure Evaluation (Signed)
Anesthesia Post Note  Patient: [name] [name]  Procedure(s) Performed: LYMPH NODE BIOPSY (Left: Groin)     Patient location during evaluation: PACU Anesthesia Type: General Level of consciousness: awake and alert Pain management: pain level controlled Vital Signs Assessment: post-procedure vital signs reviewed and stable Respiratory status: spontaneous breathing, nonlabored ventilation, respiratory function stable and patient connected to nasal cannula oxygen Cardiovascular status: blood pressure returned to baseline and stable Postop Assessment: no apparent nausea or vomiting Anesthetic complications: no   No notable events documented.  Last Vitals:  Vitals:   08/31/21 2039 09/01/21 0334  BP: (!) 113/49 (!) 99/50  Pulse: 89 66  Resp: 18 17  Temp: 37.3 C   SpO2: 98% 99%    Last Pain:  Vitals:   09/01/21 0530  TempSrc:   PainSc: 0-No pain                 Belenda Cruise P Greydis Stlouis

## 2021-09-01 NOTE — Progress Notes (Signed)
Oncology Discharge Planning Note  [facility] Address: [address] [place], [place], [place] [address] Hours of Operation:  Nena Polio, Monday - Friday  Clinic Contact Information:  [phone]) [phone]  Oncology Care Team: Medical Oncologist:  Benay Spice  Patient Details: Name:  [name] [name], [name] [name] MRN:   [id number] DOB:   Apr 07, 1945 Reason for Current Admission: '@PPROB'$ @  Discharge Planning Narrative: Notification of admission received by Inpatient team for [name].  Discharge follow-up appointments for oncology are current and available on the AVS and MyChart.   Upon discharge from the hospital, hematology/oncology's post discharge plan of care for the outpatient setting is: Tuesday, August 22,2023 at 2:15 with Ned Card NP [facility] [place], [place] [address] [phone]   [name] will be called within two business days after discharge to review hematology/oncology's plan of care for full understanding.    Outpatient Oncology Specific Care Only: Oncology appointment transportation needs addressed?:  no Oncology medication management for symptom management addressed?:  no Chemo Alert Card reviewed?:  no Immunotherapy Alert Card reviewed?:  no

## 2021-09-01 NOTE — Progress Notes (Addendum)
HEMATOLOGY-ONCOLOGY PROGRESS NOTE  ASSESSMENT AND PLAN: Macrocytic anemia/thrombocytopenia 08/13/2020 B12 3500 08/13/2020 LDH 612 08/13/2020 myeloma panel-no monoclonal protein  Bone marrow biopsy 08/20/2020-hypercellular marrow with erythroid hyperplasia and dyspoiesis, rare lipogranuloma like lesions.  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  Negative myeloma FISH panel, 46XX; neotype myeloid disorders profile NGS sequencing-no pathogenic mutations detected in any of the genes on the NGS panel [facility] screen - 08/26/2020 CTs 02/17/2021-new splenomegaly; diffuse bilateral bronchial wall thickening; clustered groundglass and fine nodular opacity in the dependent right lower lobe.  CAD. Bone marrow biopsy 08/13/2021-hypercellular bone marrow for age with trilineage hematopoiesis; several atypical lymphoid aggregates present; no increase in blastic cells; flow cytometry without significant T or B-cell abnormalities; normal cytogenetics; "the limited morphologic and immunohistochemical features are atypical and a lymphoproliferative process is not excluded" PET scan 3/50/0938-[id number] hypermetabolic lymphadenopathy in the neck, left axilla, mediastinum, retroperitoneum, and left pelvis.  Hypermetabolic skeletal lesions, generalized bilateral pulmonary hypermetabolism 08/30/2021-1 unit RBCs Fatigue Dyspnea on exertion Unsteady gait/balance disorder, followed by Dr. Tomi Likens CKD Atrial fibrillation Hypertension Sleep apnea Anorexia/weight loss 06/13/2021 skin rash, question vasculitis-course of prednisone initiated; 06/20/2021 marked improvement, rash recurred when she was tapered off of prednisone, prednisone resumed by dermatology Coagulopathy-most likely related to malnutrition and apixaban  [name] [name] appears unchanged.  She underwent excisional lymph node biopsy over the weekend.  Results are currently pending.  She had a PET scan performed recently which showed multiple hypermetabolic areas  in the lymph nodes suspicious for lymphoma.  We will plan to follow-up on the pathology.  She has been started on prednisone 60 mg daily for suspected lymphoma.  Clinically, she remains stable and CBC stable this morning.  Since she is having some increased feelings of being jittery, will reduce prednisone to 40 mg daily.  From our standpoint, okay to discharge from home.  She will follow-up later this week in our office to discuss the biopsy results and determine treatment plan.  Recommendations: 1.  We will follow-up on lymph node biopsy results once they are available. 2.  We will reduce prednisone to 40 mg p.o. daily.  She will need to continue this medication upon hospital discharge. 3.  Okay to discharge to home from our standpoint.  [name]  [name] [name] was interviewed and examined.  She reports feeling better after starting prednisone 08/30/2021.  The lymph node biopsy pathology is pending.  I contacted pathology to request a rush on the lymph node pathology.  We will decrease the prednisone dose.  She will be scheduled for outpatient follow-up at the Cancer center tomorrow.  We will initiate systemic therapy as soon as possible if a diagnosis of lymphoma is confirmed.  I was present for greater than 50% today's visit.  I performed medical decision making. SUBJECTIVE: Underwent excisional lymph node biopsy over the weekend.  Results pending.  Was started on prednisone 60 mg daily.  Feeling jittery.  No other new complaints this morning.  PHYSICAL EXAMINATION:  Vitals:   08/31/21 2039 09/01/21 0334  BP: (!) 113/49 (!) 99/50  Pulse: 89 66  Resp: 18 17  Temp: 99.2 F (37.3 C)   SpO2: 98% 99%   There were no vitals filed for this visit.  Intake/Output from previous day: 08/20 0701 - 08/21 0700 In: 4370.2 [P.O.:1080; I.V.:3290.2] Out: -   Physical Exam Vitals reviewed.  Constitutional:      General: She is not in acute distress. Eyes:     General: No scleral  icterus.  Conjunctiva/sclera: Conjunctivae normal.  Cardiovascular:     Rate and Rhythm: Normal rate.  Pulmonary:     Effort: Pulmonary effort is normal. No respiratory distress.  Skin:    General: Skin is warm and dry.  Neurological:     Mental Status: She is alert and oriented to person, place, and time.   Left groin biopsy site without bleeding or evidence of infection LABORATORY DATA:  I have reviewed the data as listed    Latest Ref Rng & Units 09/01/2021    5:19 AM 08/30/2021    5:43 AM 08/29/2021    5:40 AM  CMP  Glucose 70 - 99 mg/dL 164  93  87   BUN 8 - 23 mg/dL 27  35  41   Creatinine 0.44 - 1.00 mg/dL 1.50  2.07  2.56   Sodium 135 - 145 mmol/L 141  139  140   Potassium 3.5 - 5.1 mmol/L 3.8  4.0  4.4   Chloride 98 - 111 mmol/L 120  113  111   CO2 22 - 32 mmol/L _0 Calcium 8.9 - 10.3 mg/dL 8.2  7.8  8.2   Total Protein 6.5 - 8.1 g/dL 4.2  4.2  4.6   Total Bilirubin 0.3 - 1.2 mg/dL 0.6  0.7  0.9   Alkaline Phos 38 - 126 U/L 45  39  42   AST 15 - 41 U/L _1 ALT 0 - 44 U/L _2 Lab Results  Component Value Date   WBC 2.5 (L) 09/01/2021   HGB 7.8 (L) 09/01/2021   HCT 26.5 (L) 09/01/2021   MCV 98.1 09/01/2021   PLT 67 (L) 09/01/2021   NEUTROABS 1.3 (L) 08/22/2021    No results found for: "CEA1", "CEA", "NIO270", "CA125", "PSA1"  NM PET Image Initial (PI) Skull Base To Thigh  Result Date: 08/29/2021 CLINICAL DATA:  Initial treatment strategy for progressive pancytopenia. Severe fatigue. Anorexia with weight loss. Night sweats. EXAM: NUCLEAR MEDICINE PET SKULL BASE TO THIGH TECHNIQUE: 9.1 mCi F-18 FDG was injected intravenously. Full-ring PET imaging was performed from the skull base to thigh after the radiotracer. CT data was obtained and used for attenuation correction and anatomic localization. Fasting blood glucose: 99 mg/dl COMPARISON:  02/17/2021 CT chest, abdomen and pelvis. FINDINGS: Mediastinal blood pool activity: SUV max 2.7  Liver activity: SUV max 4.3 NECK: Several small hypermetabolic bilateral neck lymph nodes. Representative left level 2 neck 0.5 cm node with max SUV 10.5 (series 4/image 34). Representative 0.6 cm right level 3 neck node with max SUV 13.8 (series 4/image 38). Incidental CT findings: None. CHEST: Hypermetabolic left axillary lymphadenopathy with dominant 1.0 cm left axillary node with max SUV 23.6 (series 4/image 59). No enlarged or hypermetabolic right axillary nodes. Several mildly enlarged hypermetabolic bilateral mediastinal lymph nodes. Representative 1.1 cm right paratracheal node with max SUV 14.2 (series 4/image 65). Representative 1.0 cm subcarinal node with max SUV 29.5 (series 4/image 75). Hypermetabolic left hilar adenopathy with max SUV 7.4. No hypermetabolic right hilar adenopathy. Generalized hypermetabolism throughout the bilateral lung fields with associated faint ground-glass opacity in both lungs with representative max SUV 5.1 in the left lower lobe. Incidental CT findings: Coronary atherosclerosis. Atherosclerotic nonaneurysmal thoracic aorta. No significant pulmonary nodules. ABDOMEN/PELVIS: Widespread hypermetabolic lymphadenopathy in the porta hepatis, portacaval, aortocaval, left para-aortic, mesenteric, left common iliac, left external iliac and left inguinal nodal chains. Representative 1.3  cm porta hepatis node with max SUV 13.0 (series 5/image 115). Representative 1.3 cm aortocaval node with max SUV 25.7 (series 4/image 141). Representative 1.5 cm left para-aortic node with max SUV 26.7 (series 4/image 138). Representative 1.0 cm right mesenteric node with max SUV 21.2 (series 4/image 145). Representative 1.3 cm left external iliac node with max SUV 15.8 (series 4/image 172). Representative 1.0 cm left inguinal node with max SUV 15.6 (series 4/image 187). Mild-to-moderate splenomegaly. Craniocaudal splenic length 16.3 cm. Splenic FDG uptake is equivalent to the liver with max SUV 4.4. No  focal splenic masses. No abnormal hypermetabolic activity within the liver, pancreas or adrenal glands. Incidental CT findings: Postsurgical changes from fundoplication in the proximal stomach. Atherosclerotic nonaneurysmal abdominal aorta. Hysterectomy. SKELETON: Multiple hypermetabolic skeletal lesions in the axial and visualized proximal appendicular skeleton. Representative right T11 vertebral/posterior element lesion with max SUV 15.4. Representative proximal left humerus metaphysis lesion with max SUV 5.0. Representative left mandibular body lesion with max SUV 6.7. Incidental CT findings: None. IMPRESSION: 1. Widespread intensely hypermetabolic lymphadenopathy in the neck, left axilla, mediastinum, retroperitoneum and left pelvis. Mild-to-moderate splenomegaly. Scattered hypermetabolic skeletal lesions. Generalized bilateral pulmonary hypermetabolism with associated faint ground-glass opacity. Findings are most compatible with lymphoproliferative disorder/lymphoma. Deauville score 5. 2. Chronic findings include: Aortic Atherosclerosis (ICD10-I70.0). Coronary atherosclerosis. Electronically Signed   By: Ilona Sorrel M.D.   On: 08/29/2021 10:09   IR BONE MARROW BIOPSY & ASPIRATION  Result Date: 08/13/2021 INDICATION: pancytopenia, sweats, splenomegaly, anorexia EXAM: Bone marrow aspiration and core biopsy using fluoroscopic guidance MEDICATIONS: None. ANESTHESIA/SEDATION: Moderate (conscious) sedation was employed during this procedure. A total of Versed 2 mg and Fentanyl 100 mcg was administered intravenously. Moderate Sedation Time: 10 minutes. The patient's level of consciousness and vital signs were monitored continuously by radiology nursing throughout the procedure under my direct supervision. FLUOROSCOPY TIME:  Fluoroscopy Time: 1.8 minutes (18 mGy) COMPLICATIONS: None immediate. PROCEDURE: Informed written consent was obtained from the patient after a thorough discussion of the procedural risks,  benefits and alternatives. All questions were addressed. Maximal Sterile Barrier Technique was utilized including caps, mask, sterile gowns, sterile gloves, sterile drape, hand hygiene and skin antiseptic. A timeout was performed prior to the initiation of the procedure. The patient was placed prone on the IR exam table. Limited fluoroscopy of the pelvis was performed for planning purposes. Skin entry site was marked, and the overlying skin was prepped and draped in the standard sterile fashion. Local analgesia was obtained with 1% lidocaine. Using fluoroscopic guidance, an 11 gauge needle was advanced just deep to the cortex of the right posterior ilium. Subsequently, bone marrow aspiration and core biopsy were performed. Specimens were submitted to lab/pathology for handling. Hemostasis was achieved with manual pressure, and a clean dressing was placed. The patient tolerated the procedure well without immediate complication. IMPRESSION: Successful bone marrow aspiration and core biopsy of the right posterior ilium using fluoroscopic guidance. Electronically Signed   By: Albin Felling M.D.   On: 08/13/2021 14:00     Future Appointments  Date Time Provider [place]  09/04/2021  9:15 AM [facility] PHLEBOTOMIST [facility] None  09/04/2021  9:45 AM Owens Shark, NP [facility] None  09/22/2021 12:00 PM [facility]  10/09/2021  9:00 AM Jerline Pain, MD [facility]      LOS: 4 days

## 2021-09-01 NOTE — Progress Notes (Signed)
  Transition of Care [facility]) Screening Note   Patient Details  Name: [name] [name] Date of Birth: 11/12/1945   Transition of Care [facility]) CM/SW Contact:    Vassie Moselle, LCSW Phone Number: 09/01/2021, 8:52 AM    Transition of Care Department [facility]) has reviewed patient and no TOC needs have been identified at this time. We will continue to monitor patient advancement through interdisciplinary progression rounds. If new patient transition needs arise, please place a TOC consult.

## 2021-09-02 ENCOUNTER — Inpatient Hospital Stay: Payer: Medicare HMO

## 2021-09-02 ENCOUNTER — Telehealth: Payer: Self-pay

## 2021-09-02 ENCOUNTER — Inpatient Hospital Stay: Payer: Medicare HMO | Admitting: Nurse Practitioner

## 2021-09-02 ENCOUNTER — Encounter: Payer: Self-pay | Admitting: Nurse Practitioner

## 2021-09-02 ENCOUNTER — Encounter: Payer: Self-pay | Admitting: Cardiology

## 2021-09-02 VITALS — BP 133/56 | HR 100 | Temp 98.1°F | Resp 18 | Ht 67.0 in | Wt 198.0 lb

## 2021-09-02 DIAGNOSIS — R531 Weakness: Secondary | ICD-10-CM | POA: Diagnosis not present

## 2021-09-02 DIAGNOSIS — Z66 Do not resuscitate: Secondary | ICD-10-CM | POA: Diagnosis present

## 2021-09-02 DIAGNOSIS — Z0189 Encounter for other specified special examinations: Secondary | ICD-10-CM | POA: Diagnosis not present

## 2021-09-02 DIAGNOSIS — F411 Generalized anxiety disorder: Secondary | ICD-10-CM | POA: Diagnosis present

## 2021-09-02 DIAGNOSIS — D649 Anemia, unspecified: Secondary | ICD-10-CM

## 2021-09-02 DIAGNOSIS — C8598 Non-Hodgkin lymphoma, unspecified, lymph nodes of multiple sites: Secondary | ICD-10-CM

## 2021-09-02 DIAGNOSIS — E8809 Other disorders of plasma-protein metabolism, not elsewhere classified: Secondary | ICD-10-CM | POA: Diagnosis present

## 2021-09-02 DIAGNOSIS — E785 Hyperlipidemia, unspecified: Secondary | ICD-10-CM | POA: Diagnosis present

## 2021-09-02 DIAGNOSIS — C8330 Diffuse large B-cell lymphoma, unspecified site: Secondary | ICD-10-CM | POA: Diagnosis present

## 2021-09-02 DIAGNOSIS — Z452 Encounter for adjustment and management of vascular access device: Secondary | ICD-10-CM | POA: Diagnosis not present

## 2021-09-02 DIAGNOSIS — C8590 Non-Hodgkin lymphoma, unspecified, unspecified site: Secondary | ICD-10-CM | POA: Diagnosis not present

## 2021-09-02 DIAGNOSIS — D689 Coagulation defect, unspecified: Secondary | ICD-10-CM | POA: Diagnosis present

## 2021-09-02 DIAGNOSIS — I5032 Chronic diastolic (congestive) heart failure: Secondary | ICD-10-CM | POA: Diagnosis present

## 2021-09-02 DIAGNOSIS — Z96652 Presence of left artificial knee joint: Secondary | ICD-10-CM | POA: Diagnosis present

## 2021-09-02 DIAGNOSIS — K219 Gastro-esophageal reflux disease without esophagitis: Secondary | ICD-10-CM | POA: Diagnosis present

## 2021-09-02 DIAGNOSIS — E860 Dehydration: Secondary | ICD-10-CM | POA: Diagnosis present

## 2021-09-02 DIAGNOSIS — N179 Acute kidney failure, unspecified: Secondary | ICD-10-CM | POA: Diagnosis present

## 2021-09-02 DIAGNOSIS — J9811 Atelectasis: Secondary | ICD-10-CM | POA: Diagnosis not present

## 2021-09-02 DIAGNOSIS — E872 Acidosis, unspecified: Secondary | ICD-10-CM | POA: Diagnosis present

## 2021-09-02 DIAGNOSIS — I4819 Other persistent atrial fibrillation: Secondary | ICD-10-CM | POA: Diagnosis present

## 2021-09-02 DIAGNOSIS — E039 Hypothyroidism, unspecified: Secondary | ICD-10-CM | POA: Diagnosis present

## 2021-09-02 DIAGNOSIS — I480 Paroxysmal atrial fibrillation: Secondary | ICD-10-CM | POA: Diagnosis not present

## 2021-09-02 DIAGNOSIS — Z888 Allergy status to other drugs, medicaments and biological substances status: Secondary | ICD-10-CM | POA: Diagnosis not present

## 2021-09-02 DIAGNOSIS — I130 Hypertensive heart and chronic kidney disease with heart failure and stage 1 through stage 4 chronic kidney disease, or unspecified chronic kidney disease: Secondary | ICD-10-CM | POA: Diagnosis present

## 2021-09-02 DIAGNOSIS — R55 Syncope and collapse: Secondary | ICD-10-CM | POA: Diagnosis present

## 2021-09-02 DIAGNOSIS — F32A Depression, unspecified: Secondary | ICD-10-CM | POA: Diagnosis present

## 2021-09-02 DIAGNOSIS — D61818 Other pancytopenia: Secondary | ICD-10-CM | POA: Diagnosis present

## 2021-09-02 DIAGNOSIS — G4733 Obstructive sleep apnea (adult) (pediatric): Secondary | ICD-10-CM | POA: Diagnosis present

## 2021-09-02 DIAGNOSIS — I5030 Unspecified diastolic (congestive) heart failure: Secondary | ICD-10-CM | POA: Diagnosis not present

## 2021-09-02 DIAGNOSIS — Z20822 Contact with and (suspected) exposure to covid-19: Secondary | ICD-10-CM | POA: Diagnosis present

## 2021-09-02 DIAGNOSIS — N1832 Chronic kidney disease, stage 3b: Secondary | ICD-10-CM | POA: Diagnosis present

## 2021-09-02 DIAGNOSIS — Z880 Allergy status to penicillin: Secondary | ICD-10-CM | POA: Diagnosis not present

## 2021-09-02 DIAGNOSIS — R042 Hemoptysis: Secondary | ICD-10-CM | POA: Diagnosis present

## 2021-09-02 LAB — CMP (CANCER CENTER ONLY)
ALT: 19 U/L (ref 0–44)
AST: 54 U/L — ABNORMAL HIGH (ref 15–41)
Albumin: 2.5 g/dL — ABNORMAL LOW (ref 3.5–5.0)
Alkaline Phosphatase: 72 U/L (ref 38–126)
Anion gap: 10 (ref 5–15)
BUN: 27 mg/dL — ABNORMAL HIGH (ref 8–23)
CO2: 16 mmol/L — ABNORMAL LOW (ref 22–32)
Calcium: 8.2 mg/dL — ABNORMAL LOW (ref 8.9–10.3)
Chloride: 110 mmol/L (ref 98–111)
Creatinine: 1.3 mg/dL — ABNORMAL HIGH (ref 0.44–1.00)
GFR, Estimated: 43 mL/min — ABNORMAL LOW (ref >60)
Glucose, Bld: 174 mg/dL — ABNORMAL HIGH (ref 70–99)
Potassium: 4.1 mmol/L (ref 3.5–5.1)
Sodium: 136 mmol/L (ref 135–145)
Total Bilirubin: 1 mg/dL (ref 0.3–1.2)
Total Protein: 4.8 g/dL — ABNORMAL LOW (ref 6.5–8.1)

## 2021-09-02 LAB — CBC WITH DIFFERENTIAL (CANCER CENTER ONLY)
Abs Immature Granulocytes: 0.02 10*3/uL (ref 0.00–0.07)
Basophils Absolute: 0 10*3/uL (ref 0.0–0.1)
Basophils Relative: 0 %
Eosinophils Absolute: 0 10*3/uL (ref 0.0–0.5)
Eosinophils Relative: 0 %
HCT: 28.1 % — ABNORMAL LOW (ref 36.0–46.0)
Hemoglobin: 8.7 g/dL — ABNORMAL LOW (ref 12.0–15.0)
Immature Granulocytes: 1 %
Lymphocytes Relative: 9 %
Lymphs Abs: 0.2 10*3/uL — ABNORMAL LOW (ref 0.7–4.0)
MCH: 29 pg (ref 26.0–34.0)
MCHC: 31 g/dL (ref 30.0–36.0)
MCV: 93.7 fL (ref 80.0–100.0)
Monocytes Absolute: 0.4 10*3/uL (ref 0.1–1.0)
Monocytes Relative: 17 %
Neutro Abs: 1.7 10*3/uL (ref 1.7–7.7)
Neutrophils Relative %: 73 %
Platelet Count: 74 10*3/uL — ABNORMAL LOW (ref 150–400)
RBC: 3 MIL/uL — ABNORMAL LOW (ref 3.87–5.11)
RDW: 19.7 % — ABNORMAL HIGH (ref 11.5–15.5)
WBC Count: 2.4 10*3/uL — ABNORMAL LOW (ref 4.0–10.5)
nRBC: 0 % (ref 0.0–0.2)

## 2021-09-02 LAB — SURGICAL PATHOLOGY

## 2021-09-02 LAB — LACTATE DEHYDROGENASE: LDH: 1002 U/L — ABNORMAL HIGH (ref 98–192)

## 2021-09-02 NOTE — Progress Notes (Unsigned)
[facility] [place] OFFICE PROGRESS NOTE   Diagnosis: Anemia, thrombocytopenia  INTERVAL HISTORY:   [name] [name] returns as scheduled.  Continue significant fatigue.  She is having night sweats.  Appetite is poor.  Objective:  Vital signs in last 24 hours:  Blood pressure (!) 133/56, pulse 100, temperature 98.1 F (36.7 C), temperature source Oral, resp. rate 18, height 5' 7"  (1.702 m), weight 198 lb (89.8 kg), SpO2 98 %.    Lymphatics: No palpable cervical, supraclavicular or axillary lymph nodes.  Left inguinal lymph node biopsy site without erythema. Resp: Lungs clear bilaterally. Cardio: Regular rate and rhythm. GI: No hepatosplenomegaly.   Vascular: No leg edema. Skin: Ecchymoses at the lower arms.   Lab Results:  Lab Results  Component Value Date   WBC 2.4 (L) 09/02/2021   HGB 8.7 (L) 09/02/2021   HCT 28.1 (L) 09/02/2021   MCV 93.7 09/02/2021   PLT 74 (L) 09/02/2021   NEUTROABS 1.7 09/02/2021    Imaging:  No results found.  Medications: I have reviewed the patient's current medications.  Assessment/Plan: Macrocytic anemia/thrombocytopenia 08/13/2020 B12 3500 08/13/2020 LDH 612 08/13/2020 myeloma panel-no monoclonal protein  Bone marrow biopsy 08/20/2020-hypercellular marrow with erythroid hyperplasia and dyspoiesis, rare lipogranuloma like lesions.  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  Negative myeloma FISH panel, 46XX; neotype myeloid disorders profile NGS sequencing-no pathogenic mutations detected in any of the genes on the NGS panel [facility] screen - 08/26/2020 CTs 02/17/2021-new splenomegaly; diffuse bilateral bronchial wall thickening; clustered groundglass and fine nodular opacity in the dependent right lower lobe.  CAD. Bone marrow biopsy 08/13/2021-hypercellular bone marrow for age with trilineage hematopoiesis; several atypical lymphoid aggregates present; no increase in blastic cells; flow cytometry without significant T or B-cell  abnormalities; normal cytogenetics; "the limited morphologic and immunohistochemical features are atypical and a lymphoproliferative process is not excluded" PET scan 3/82/5053-[id number] hypermetabolic lymphadenopathy in the neck, left axilla, mediastinum, retroperitoneum, and left pelvis.  Hypermetabolic skeletal lesions, generalized bilateral pulmonary hypermetabolism 08/30/2021-1 unit RBCs Excisional lymph node biopsy left groin 08/30/2021-preliminary non-Hodgkin's lymphoma Fatigue Dyspnea on exertion Unsteady gait/balance disorder, followed by Dr. Tomi Likens CKD Atrial fibrillation Hypertension Sleep apnea Anorexia/weight loss 06/13/2021 skin rash, question vasculitis-course of prednisone initiated; 06/20/2021 marked improvement, rash recurred when she was tapered off of prednisone, prednisone resumed by dermatology Coagulopathy-most likely related to malnutrition and apixaban    Disposition: [name] [name] appears unchanged.  She underwent excisional left groin lymph node biopsy 08/30/2021.  Preliminary pathology indicates non-Hodgkin's lymphoma.  Final pathology should be available 09/03/2021.  We reviewed treatment with CHOP/Rituxan.  We reviewed potential toxicities associated with chemotherapy including bone marrow toxicity, nausea, mouth sores, diarrhea, rash, hair loss.  We discussed that Adriamycin and vincristine are vesicants and a Port-A-Cath is required for this regimen.  We discussed the potential for cardiotoxicity with Adriamycin.  We discussed the neuropathy associated with vincristine.  We reviewed potential side effects associated with Rituxan including an allergic reaction, reactivation of hepatitis B, PML.  She was provided with printed information on each drug.  We discussed white cell growth factor support on day 2.  She agrees to proceed.  She will attend a chemotherapy education class.  We will obtain hepatitis B testing.  She is being referred for a 2D echo and Port-A-Cath placement as  soon as possible.  Anticipate cycle 1 early next week.  We will see her in follow-up prior to treatment next week.  Patient seen with Dr. Benay Spice.    Ned Card ANP/GNP-BC  09/02/2021  2:38 PM  This was a shared visit with Ned Card.  [name] [name] was interviewed and examined.  She appears stable.  She has experienced partial improvement in her performance status while on prednisone.  Initial review of the lymph node histology is consistent with NHL, high grade. The final report should be available tomorrow.  The plan is to proceed with R-CHOP if a diagnosis of large b-cell lymphoma is confirmed.  We reviewed the potential toxicities associated with the R-CHOP regimen.  She agrees to proceed.  She will attend a chemotherapy teaching class.   The plan is to begin systemic therapy within the next 1 week.   I was present for greater than 50% of today's visit, I performed medical decision making.  [name], MD

## 2021-09-02 NOTE — Telephone Encounter (Signed)
Patient is schedule for her Echo in [place], [place]. She needs to arrive at 3:45 pm for check in--[address]---the building is right across the road from [facility] on [address]. Patient is aware of the appointment

## 2021-09-03 ENCOUNTER — Other Ambulatory Visit: Payer: Self-pay

## 2021-09-03 ENCOUNTER — Other Ambulatory Visit: Payer: Medicare HMO

## 2021-09-03 ENCOUNTER — Inpatient Hospital Stay (HOSPITAL_COMMUNITY)
Admission: EM | Admit: 2021-09-03 | Discharge: 2021-09-10 | DRG: 824 | Disposition: A | Discharge status: Home Health | Payer: Medicare HMO | Attending: Internal Medicine | Admitting: Internal Medicine

## 2021-09-03 ENCOUNTER — Encounter: Payer: Self-pay | Admitting: Oncology

## 2021-09-03 ENCOUNTER — Other Ambulatory Visit: Payer: Self-pay | Admitting: Nurse Practitioner

## 2021-09-03 ENCOUNTER — Telehealth: Payer: Self-pay

## 2021-09-03 ENCOUNTER — Emergency Department (HOSPITAL_COMMUNITY): Payer: Medicare HMO

## 2021-09-03 ENCOUNTER — Emergency Department: Payer: Self-pay

## 2021-09-03 ENCOUNTER — Encounter (HOSPITAL_COMMUNITY): Payer: Self-pay | Admitting: Emergency Medicine

## 2021-09-03 DIAGNOSIS — I130 Hypertensive heart and chronic kidney disease with heart failure and stage 1 through stage 4 chronic kidney disease, or unspecified chronic kidney disease: Secondary | ICD-10-CM | POA: Diagnosis present

## 2021-09-03 DIAGNOSIS — C8598 Non-Hodgkin lymphoma, unspecified, lymph nodes of multiple sites: Secondary | ICD-10-CM | POA: Diagnosis not present

## 2021-09-03 DIAGNOSIS — R630 Anorexia: Secondary | ICD-10-CM

## 2021-09-03 DIAGNOSIS — Z880 Allergy status to penicillin: Secondary | ICD-10-CM

## 2021-09-03 DIAGNOSIS — N179 Acute kidney failure, unspecified: Secondary | ICD-10-CM | POA: Diagnosis present

## 2021-09-03 DIAGNOSIS — I5030 Unspecified diastolic (congestive) heart failure: Secondary | ICD-10-CM | POA: Diagnosis not present

## 2021-09-03 DIAGNOSIS — R7303 Prediabetes: Secondary | ICD-10-CM | POA: Diagnosis present

## 2021-09-03 DIAGNOSIS — C8590 Non-Hodgkin lymphoma, unspecified, unspecified site: Secondary | ICD-10-CM | POA: Diagnosis present

## 2021-09-03 DIAGNOSIS — E039 Hypothyroidism, unspecified: Secondary | ICD-10-CM | POA: Diagnosis present

## 2021-09-03 DIAGNOSIS — E860 Dehydration: Secondary | ICD-10-CM | POA: Diagnosis not present

## 2021-09-03 DIAGNOSIS — Z66 Do not resuscitate: Secondary | ICD-10-CM | POA: Diagnosis present

## 2021-09-03 DIAGNOSIS — F32A Depression, unspecified: Secondary | ICD-10-CM | POA: Diagnosis present

## 2021-09-03 DIAGNOSIS — R531 Weakness: Principal | ICD-10-CM

## 2021-09-03 DIAGNOSIS — E872 Acidosis, unspecified: Secondary | ICD-10-CM | POA: Diagnosis present

## 2021-09-03 DIAGNOSIS — N816 Rectocele: Secondary | ICD-10-CM | POA: Insufficient documentation

## 2021-09-03 DIAGNOSIS — C8330 Diffuse large B-cell lymphoma, unspecified site: Principal | ICD-10-CM | POA: Diagnosis present

## 2021-09-03 DIAGNOSIS — Z79890 Hormone replacement therapy (postmenopausal): Secondary | ICD-10-CM

## 2021-09-03 DIAGNOSIS — G4733 Obstructive sleep apnea (adult) (pediatric): Secondary | ICD-10-CM | POA: Diagnosis present

## 2021-09-03 DIAGNOSIS — R042 Hemoptysis: Secondary | ICD-10-CM | POA: Diagnosis present

## 2021-09-03 DIAGNOSIS — Z8249 Family history of ischemic heart disease and other diseases of the circulatory system: Secondary | ICD-10-CM

## 2021-09-03 DIAGNOSIS — F411 Generalized anxiety disorder: Secondary | ICD-10-CM | POA: Diagnosis present

## 2021-09-03 DIAGNOSIS — M8588 Other specified disorders of bone density and structure, other site: Secondary | ICD-10-CM

## 2021-09-03 DIAGNOSIS — Z7901 Long term (current) use of anticoagulants: Secondary | ICD-10-CM

## 2021-09-03 DIAGNOSIS — K219 Gastro-esophageal reflux disease without esophagitis: Secondary | ICD-10-CM | POA: Diagnosis present

## 2021-09-03 DIAGNOSIS — D6869 Other thrombophilia: Secondary | ICD-10-CM

## 2021-09-03 DIAGNOSIS — I5032 Chronic diastolic (congestive) heart failure: Secondary | ICD-10-CM | POA: Diagnosis present

## 2021-09-03 DIAGNOSIS — E8809 Other disorders of plasma-protein metabolism, not elsewhere classified: Secondary | ICD-10-CM | POA: Diagnosis present

## 2021-09-03 DIAGNOSIS — I4891 Unspecified atrial fibrillation: Secondary | ICD-10-CM

## 2021-09-03 DIAGNOSIS — R55 Syncope and collapse: Secondary | ICD-10-CM | POA: Diagnosis not present

## 2021-09-03 DIAGNOSIS — K5900 Constipation, unspecified: Secondary | ICD-10-CM | POA: Diagnosis not present

## 2021-09-03 DIAGNOSIS — Z888 Allergy status to other drugs, medicaments and biological substances status: Secondary | ICD-10-CM

## 2021-09-03 DIAGNOSIS — Z96652 Presence of left artificial knee joint: Secondary | ICD-10-CM | POA: Diagnosis present

## 2021-09-03 DIAGNOSIS — I4819 Other persistent atrial fibrillation: Secondary | ICD-10-CM | POA: Diagnosis present

## 2021-09-03 DIAGNOSIS — E785 Hyperlipidemia, unspecified: Secondary | ICD-10-CM | POA: Diagnosis not present

## 2021-09-03 DIAGNOSIS — I480 Paroxysmal atrial fibrillation: Secondary | ICD-10-CM | POA: Diagnosis present

## 2021-09-03 DIAGNOSIS — Z79899 Other long term (current) drug therapy: Secondary | ICD-10-CM

## 2021-09-03 DIAGNOSIS — D649 Anemia, unspecified: Secondary | ICD-10-CM

## 2021-09-03 DIAGNOSIS — F331 Major depressive disorder, recurrent, moderate: Secondary | ICD-10-CM | POA: Insufficient documentation

## 2021-09-03 DIAGNOSIS — Z20822 Contact with and (suspected) exposure to covid-19: Secondary | ICD-10-CM | POA: Diagnosis present

## 2021-09-03 DIAGNOSIS — N1832 Chronic kidney disease, stage 3b: Secondary | ICD-10-CM | POA: Diagnosis present

## 2021-09-03 DIAGNOSIS — D61818 Other pancytopenia: Secondary | ICD-10-CM | POA: Diagnosis present

## 2021-09-03 DIAGNOSIS — K649 Unspecified hemorrhoids: Secondary | ICD-10-CM | POA: Diagnosis present

## 2021-09-03 DIAGNOSIS — J9811 Atelectasis: Secondary | ICD-10-CM | POA: Diagnosis not present

## 2021-09-03 DIAGNOSIS — D689 Coagulation defect, unspecified: Secondary | ICD-10-CM | POA: Diagnosis present

## 2021-09-03 DIAGNOSIS — I10 Essential (primary) hypertension: Secondary | ICD-10-CM | POA: Diagnosis present

## 2021-09-03 HISTORY — DX: Non-Hodgkin lymphoma, unspecified, unspecified site: C85.90

## 2021-09-03 HISTORY — DX: Major depressive disorder, recurrent, moderate: F33.1

## 2021-09-03 HISTORY — DX: Other thrombophilia: D68.69

## 2021-09-03 HISTORY — DX: Syncope and collapse: R55

## 2021-09-03 HISTORY — DX: Rectocele: N81.6

## 2021-09-03 HISTORY — DX: Prediabetes: R73.03

## 2021-09-03 HISTORY — DX: Anorexia: R63.0

## 2021-09-03 HISTORY — DX: Other specified disorders of bone density and structure, other site: M85.88

## 2021-09-03 LAB — TSH: TSH: 0.149 u[IU]/mL — ABNORMAL LOW (ref 0.350–4.500)

## 2021-09-03 LAB — URINALYSIS, ROUTINE W REFLEX MICROSCOPIC
Bilirubin Urine: NEGATIVE
Glucose, UA: NEGATIVE mg/dL
Hgb urine dipstick: NEGATIVE
Ketones, ur: NEGATIVE mg/dL
Leukocytes,Ua: NEGATIVE
Nitrite: NEGATIVE
Protein, ur: NEGATIVE mg/dL
Specific Gravity, Urine: 1.008 (ref 1.005–1.030)
pH: 5 (ref 5.0–8.0)

## 2021-09-03 LAB — TROPONIN I (HIGH SENSITIVITY)
Troponin I (High Sensitivity): 11 ng/L (ref <18)
Troponin I (High Sensitivity): 9 ng/L (ref <18)

## 2021-09-03 LAB — CBG MONITORING, ED: Glucose-Capillary: 102 mg/dL — ABNORMAL HIGH (ref 70–99)

## 2021-09-03 LAB — CBC
HCT: 32.2 % — ABNORMAL LOW (ref 36.0–46.0)
Hemoglobin: 9.6 g/dL — ABNORMAL LOW (ref 12.0–15.0)
MCH: 29.5 pg (ref 26.0–34.0)
MCHC: 29.8 g/dL — ABNORMAL LOW (ref 30.0–36.0)
MCV: 99.1 fL (ref 80.0–100.0)
Platelets: 75 10*3/uL — ABNORMAL LOW (ref 150–400)
RBC: 3.25 MIL/uL — ABNORMAL LOW (ref 3.87–5.11)
RDW: 19.3 % — ABNORMAL HIGH (ref 11.5–15.5)
WBC: 2.1 10*3/uL — ABNORMAL LOW (ref 4.0–10.5)
nRBC: 0 % (ref 0.0–0.2)

## 2021-09-03 LAB — BASIC METABOLIC PANEL
Anion gap: 7 (ref 5–15)
BUN: 25 mg/dL — ABNORMAL HIGH (ref 8–23)
CO2: 20 mmol/L — ABNORMAL LOW (ref 22–32)
Calcium: 8.6 mg/dL — ABNORMAL LOW (ref 8.9–10.3)
Chloride: 113 mmol/L — ABNORMAL HIGH (ref 98–111)
Creatinine, Ser: 1.43 mg/dL — ABNORMAL HIGH (ref 0.44–1.00)
GFR, Estimated: 38 mL/min — ABNORMAL LOW (ref >60)
Glucose, Bld: 102 mg/dL — ABNORMAL HIGH (ref 70–99)
Potassium: 4.2 mmol/L (ref 3.5–5.1)
Sodium: 140 mmol/L (ref 135–145)

## 2021-09-03 LAB — MAGNESIUM: Magnesium: 1.8 mg/dL (ref 1.7–2.4)

## 2021-09-03 LAB — CULTURE, BLOOD (ROUTINE X 2)
Culture: NO GROWTH
Culture: NO GROWTH
Special Requests: ADEQUATE
Special Requests: ADEQUATE

## 2021-09-03 LAB — LACTIC ACID, PLASMA
Lactic Acid, Venous: 3.5 mmol/L (ref 0.5–1.9)
Lactic Acid, Venous: 1.9 mmol/L (ref 0.5–1.9)

## 2021-09-03 LAB — SARS CORONAVIRUS 2 BY RT PCR: SARS Coronavirus 2 by RT PCR: NEGATIVE

## 2021-09-03 MED ORDER — BUPROPION HCL ER (XL) 150 MG PO TB24
150.0000 mg | ORAL_TABLET | Freq: Every day | ORAL | Status: DC
  Administered 2021-09-04 – 2021-09-10 (×7): 150 mg via ORAL
  Filled 2021-09-03 (×7): qty 1

## 2021-09-03 MED ORDER — SODIUM CHLORIDE 0.9% FLUSH: 10.0000 mL | INTRAVENOUS | Status: DC | PRN

## 2021-09-03 MED ORDER — APIXABAN 5 MG PO TABS
5.0000 mg | ORAL_TABLET | Freq: Two times a day (BID) | ORAL | Status: DC
  Administered 2021-09-03 – 2021-09-04 (×2): 5 mg via ORAL
  Filled 2021-09-03 (×2): qty 1

## 2021-09-03 MED ORDER — ACETAMINOPHEN 325 MG PO TABS: 650.0000 mg | ORAL_TABLET | Freq: Four times a day (QID) | ORAL | Status: DC | PRN

## 2021-09-03 MED ORDER — MAGNESIUM OXIDE -MG SUPPLEMENT 400 (240 MG) MG PO TABS
200.0000 mg | ORAL_TABLET | Freq: Every day | ORAL | Status: DC
  Administered 2021-09-03 – 2021-09-10 (×8): 200 mg via ORAL
  Filled 2021-09-03 (×8): qty 1

## 2021-09-03 MED ORDER — POTASSIUM CHLORIDE CRYS ER 20 MEQ PO TBCR: 20.0000 meq | EXTENDED_RELEASE_TABLET | Freq: Every day | ORAL | Status: DC | PRN

## 2021-09-03 MED ORDER — AMIODARONE HCL 200 MG PO TABS
200.0000 mg | ORAL_TABLET | Freq: Every day | ORAL | Status: DC
  Administered 2021-09-04 – 2021-09-10 (×7): 200 mg via ORAL
  Filled 2021-09-03 (×7): qty 1

## 2021-09-03 MED ORDER — LORATADINE 10 MG PO TABS
10.0000 mg | ORAL_TABLET | Freq: Every day | ORAL | Status: DC
  Administered 2021-09-04 – 2021-09-10 (×7): 10 mg via ORAL
  Filled 2021-09-03 (×7): qty 1

## 2021-09-03 MED ORDER — ALPRAZOLAM 0.5 MG PO TABS: 0.5000 mg | ORAL_TABLET | Freq: Three times a day (TID) | ORAL | Status: DC | PRN

## 2021-09-03 MED ORDER — PRAVASTATIN SODIUM 20 MG PO TABS
40.0000 mg | ORAL_TABLET | Freq: Every day | ORAL | Status: DC
Start: 2021-09-03 — End: 2021-09-10
  Administered 2021-09-03 – 2021-09-09 (×7): 40 mg via ORAL
  Filled 2021-09-03: qty 1
  Filled 2021-09-03 (×5): qty 2
  Filled 2021-09-03: qty 1

## 2021-09-03 MED ORDER — METOPROLOL SUCCINATE ER 25 MG PO TB24
25.0000 mg | ORAL_TABLET | Freq: Every day | ORAL | Status: DC
  Administered 2021-09-04 – 2021-09-08 (×5): 25 mg via ORAL
  Filled 2021-09-03 (×8): qty 1

## 2021-09-03 MED ORDER — ACETAMINOPHEN 650 MG RE SUPP: 650.0000 mg | Freq: Four times a day (QID) | RECTAL | Status: DC | PRN

## 2021-09-03 MED ORDER — SODIUM CHLORIDE 0.9 % IV BOLUS
1000.0000 mL | Freq: Once | INTRAVENOUS | Status: AC
  Administered 2021-09-03: 1000 mL via INTRAVENOUS

## 2021-09-03 MED ORDER — SODIUM CHLORIDE 0.9% FLUSH
10.0000 mL | Freq: Two times a day (BID) | INTRAVENOUS | Status: DC
  Administered 2021-09-05 – 2021-09-10 (×9): 10 mL

## 2021-09-03 MED ORDER — BENZONATATE 100 MG PO CAPS
200.0000 mg | ORAL_CAPSULE | Freq: Every day | ORAL | Status: DC | PRN
Start: 2021-09-03 — End: 2021-09-10

## 2021-09-03 MED ORDER — ALBUTEROL SULFATE (2.5 MG/3ML) 0.083% IN NEBU
2.5000 mg | INHALATION_SOLUTION | RESPIRATORY_TRACT | Status: DC | PRN
Start: 2021-09-03 — End: 2021-09-10

## 2021-09-03 MED ORDER — SODIUM CHLORIDE 0.9% FLUSH
3.0000 mL | Freq: Two times a day (BID) | INTRAVENOUS | Status: DC
  Administered 2021-09-05 – 2021-09-10 (×8): 3 mL via INTRAVENOUS

## 2021-09-03 MED ORDER — MIRTAZAPINE 30 MG PO TABS
30.0000 mg | ORAL_TABLET | Freq: Every day | ORAL | Status: DC
Start: 2021-09-03 — End: 2021-09-10
  Administered 2021-09-03 – 2021-09-09 (×7): 30 mg via ORAL
  Filled 2021-09-03: qty 1
  Filled 2021-09-03 (×2): qty 2
  Filled 2021-09-03 (×4): qty 1

## 2021-09-03 MED ORDER — PANTOPRAZOLE SODIUM 40 MG PO TBEC
40.0000 mg | DELAYED_RELEASE_TABLET | Freq: Every day | ORAL | Status: DC
  Administered 2021-09-03 – 2021-09-10 (×8): 40 mg via ORAL
  Filled 2021-09-03 (×8): qty 1

## 2021-09-03 MED ORDER — CHLORHEXIDINE GLUCONATE CLOTH 2 % EX PADS
6.0000 | MEDICATED_PAD | Freq: Every day | CUTANEOUS | Status: DC
  Administered 2021-09-03 – 2021-09-10 (×8): 6 via TOPICAL

## 2021-09-03 MED ORDER — FUROSEMIDE 20 MG PO TABS: 20.0000 mg | ORAL_TABLET | Freq: Every day | ORAL | Status: DC | PRN

## 2021-09-03 MED ORDER — ONDANSETRON HCL 4 MG/2ML IJ SOLN: 4.0000 mg | Freq: Four times a day (QID) | INTRAMUSCULAR | Status: DC | PRN

## 2021-09-03 MED ORDER — PREDNISONE 20 MG PO TABS
40.0000 mg | ORAL_TABLET | Freq: Every day | ORAL | Status: DC
  Administered 2021-09-04: 40 mg via ORAL
  Filled 2021-09-03: qty 2

## 2021-09-03 MED ORDER — FAMOTIDINE 20 MG PO TABS: 20.0000 mg | ORAL_TABLET | Freq: Every day | ORAL | Status: DC | PRN

## 2021-09-03 MED ORDER — ONDANSETRON HCL 4 MG PO TABS: 4.0000 mg | ORAL_TABLET | Freq: Four times a day (QID) | ORAL | Status: DC | PRN

## 2021-09-03 NOTE — H&P (Signed)
History and Physical    Patient: [name] [name] DOB: 1945/02/08 DOA: 09/03/2021 DOS: the patient was seen and examined on 09/03/2021 PCP: Harlan Stains, MD  Patient coming from: Home  Chief Complaint:  Chief Complaint  Patient presents with   Weakness   HPI: [name] [name] is a 76 y.o. female with medical history significant of paroxysmal atrial fibrillation, anxiety, depression, osteoarthritis, bilateral cataracts, stage III CKD, GERD, hemorrhoids, bronchitis, colon polyps, hyperlipidemia, hypertension, prediabetes, sleep apnea not on CPAP urinary urgency who was recently admitted and discharged due to generalized weakness and a 40 pound unintentional weight loss in the setting of non-Hodgkin's lymphoma.  She had an episode of hemoptysis this morning, but none since then.  They spoke to the patient's oncologist Dr. Rondel Oh who recommended her to come to the emergency department.  While she was in triage, she had a syncopal episode.  No fever, chills, but positive decreased appetite and night sweats. No sore throat, rhinorrhea, dyspnea, wheezing or hemoptysis.  No chest pain, palpitations, diaphoresis, PND, orthopnea or pitting edema of the lower extremities.  No abdominal pain, constipation, melena, but had diarrhea last week and hematochezia while hospitalized.  She received a blood transfusion during her last hospitalization.  No flank pain, dysuria, frequency or hematuria.  No polyuria, polydipsia, polyphagia or blurred vision.  ED course: Initial vital signs were temperature 98.7 F, pulse 86, respirations 16, BP 122/55 mmHg O2 sat 98% on room air.  The patient received 2000 mL of normal saline bolus.  Lab work: Her urinalysis was unremarkable.  Lactic acid is 3.5 and then 1.9 mmol/L.  Troponin x2 normal.  CBC showed a white count of 2.1, hemoglobin 9.6 g/dL platelets 75.  TSH was 0.149 IU/mL.  BMP showed normal sodium and potassium, chloride 113 and CO2 20 mmol/L with a normal  anion gap.  Glucose 102, BUN 25, creatinine 1.43 and calcium 8.6 mg/dL.  Imaging: There was minimal atelectasis within the left lung base on a portable 1 view chest radiograph.   Review of Systems: As mentioned in the history of present illness. All other systems reviewed and are negative. Past Medical History:  Diagnosis Date   A-fib ([place])    paroxysmal   Anxiety    Arthritis    Cataracts, bilateral    immature   CKD (chronic kidney disease) stage 3, GFR 30-59 ml/min (HCC)    Depression    GERD (gastroesophageal reflux disease)    was on Nexium    GERD (gastroesophageal reflux disease)    Hemorrhoid    History of bronchitis    couple of yrs ago   History of colon polyps    History of peristent atrial fibrillation    Hyperlipidemia    takes Pravastatin daily   Hypertension    takes Amlodipine and Diovan daily   Hypertension    Pre-diabetes    Sleep apnea    no cpap used   Sleep apnea    study done 20+yrs ago and doesn't use cpap   Urinary urgency    Weakness    in left leg r/t back   Past Surgical History:  Procedure Laterality Date   ABDOMINAL HYSTERECTOMY  1980   partial   ABDOMINAL HYSTERECTOMY     partial   BACK SURGERY     CARDIOVERSION N/A 02/24/2016   Procedure: CARDIOVERSION;  Surgeon: Fay Records, MD;  Location: [facility];  Service: Cardiovascular;  Laterality: N/A;   CARDIOVERSION N/A 03/19/2016   Procedure:  CARDIOVERSION;  Surgeon: Fay Records, MD;  Location: [facility];  Service: Cardiovascular;  Laterality: N/A;   CARDIOVERSION N/A 05/15/2021   Procedure: CARDIOVERSION;  Surgeon: Pixie Casino, MD;  Location: [facility];  Service: Cardiovascular;  Laterality: N/A;   [place]     COLONOSCOPY     COLONOSCOPY WITH PROPOFOL  12/29/2011   Procedure: COLONOSCOPY WITH PROPOFOL;  Surgeon: Lear Ng, MD;  Location: [facility];  Service: Endoscopy;  Laterality: N/A;   COLONOSCOPY  WITH PROPOFOL N/A 05/24/2014   Procedure: COLONOSCOPY WITH PROPOFOL;  Surgeon: Wilford Corner, MD;  Location: [facility];  Service: Endoscopy;  Laterality: N/A;   ESOPHAGEAL MANOMETRY N/A 02/22/2017   Procedure: ESOPHAGEAL MANOMETRY (EM);  Surgeon: Wilford Corner, MD;  Location: [facility];  Service: Endoscopy;  Laterality: N/A;   ESOPHAGOGASTRODUODENOSCOPY     FOOT SURGERY     left, right   FRACTURE SURGERY     left leg-knee   HEEL SPUR EXCISION Bilateral    HOT HEMOSTASIS  12/29/2011   Procedure: HOT HEMOSTASIS (ARGON PLASMA COAGULATION/BICAP);  Surgeon: Lear Ng, MD;  Location: [facility];  Service: Endoscopy;  Laterality: N/A;   HOT HEMOSTASIS N/A 05/24/2014   Procedure: HOT HEMOSTASIS (ARGON PLASMA COAGULATION/BICAP);  Surgeon: Wilford Corner, MD;  Location: [facility];  Service: Endoscopy;  Laterality: N/A;   IR BONE MARROW BIOPSY & ASPIRATION  08/13/2021   leg surgery d/t break Left    LUMBAR LAMINECTOMY/DECOMPRESSION MICRODISCECTOMY Left 04/05/2013   Procedure: LUMBAR FOUR TO FIVE LUMBAR LAMINECTOMY/DECOMPRESSION MICRODISCECTOMY 1 LEVEL;  Surgeon: Eustace Moore, MD;  Location: [facility];  Service: Neurosurgery;  Laterality: Left;   LYMPH NODE BIOPSY Left 08/30/2021   Procedure: LYMPH NODE BIOPSY;  Surgeon: Jovita Kussmaul, MD;  Location: [facility];  Service: General;  Laterality: Left;   NISSEN FUNDOPLICATION     [place] IMPEDANCE STUDY N/A 02/22/2017   Procedure: [place] IMPEDANCE STUDY;  Surgeon: Wilford Corner, MD;  Location: [facility];  Service: Endoscopy;  Laterality: N/A;   TEE WITHOUT CARDIOVERSION N/A 02/24/2016   Procedure: TRANSESOPHAGEAL ECHOCARDIOGRAM (TEE);  Surgeon: Fay Records, MD;  Location: [facility];  Service: Cardiovascular;  Laterality: N/A;   TOTAL KNEE ARTHROPLASTY Left 10/07/2016   TOTAL KNEE ARTHROPLASTY Left 10/07/2016   Procedure: TOTAL KNEE ARTHROPLASTY;  Surgeon: [name], MD;  Location: [facility];  Service: Orthopedics;  Laterality:  Left;   Social History:  reports that she has never smoked. She has never used smokeless tobacco. She reports that she does not drink alcohol and does not use drugs.  Allergies  Allergen Reactions   Omnicef [Cefdinir] Swelling    Tongue swelling   Bacitracin     Rash   Bacitracin-Polymyxin B     Rash   Duloxetine Hcl     felt funny- thinking messed up   Lexapro [Escitalopram]     Makes her feel funny and sleepy   Mirtazapine     weight gain in high doses    Penicillins Swelling    Tongue swell   Peroxide [Hydrogen Peroxide] Other (See Comments)    Redness.    Zoloft [Sertraline Hcl]     Makes her feel funny   Neosporin [Neomycin-Bacitracin Zn-Polymyx] Rash    Blisters, itching.     Family History  Problem Relation Age of Onset   Colon cancer Mother    Heart disease Father    Breast cancer Neg  Hx    Thyroid disease Neg Hx     Prior to Admission medications   Medication Sig Start Date End Date Taking? Authorizing Provider  acetaminophen (TYLENOL) 500 MG tablet Take 1,000 mg by mouth every 6 (six) hours as needed for moderate pain.   Yes [provider]  albuterol (VENTOLIN HFA) 108 (90 Base) MCG/ACT inhaler Inhale 1-2 puffs into the lungs every 4 (four) hours as needed for wheezing or shortness of breath. 07/22/18  Yes Parrett, Fonnie Mu, NP  ALPRAZolam (XANAX) 0.5 MG tablet Take 1 tablet (0.5 mg total) by mouth 3 (three) times daily as needed for anxiety. 03/11/21  Yes Owens Shark, NP  amiodarone (PACERONE) 200 MG tablet Taking one tablet by mouth daily Patient taking differently: Take 200 mg by mouth daily. 05/22/21  Yes Fenton, Clint R, PA  apixaban (ELIQUIS) 5 MG TABS tablet Take 1 tablet (5 mg total) by mouth 2 (two) times daily. 03/27/21 03/22/22 Yes Loel Dubonnet, NP  benzonatate (TESSALON) 200 MG capsule Take 1 capsule by mouth three times daily as needed for cough Patient taking differently: Take 200 mg by mouth daily as needed for cough. 05/22/19  Yes  Parrett, Tammy S, NP  buPROPion (WELLBUTRIN XL) 300 MG 24 hr tablet Take 150 mg by mouth daily. 08/02/21  Yes [provider]  Coenzyme Q10 (CO Q-10) 100 MG CAPS Take 100 mg by mouth daily.   Yes [provider]  famotidine (PEPCID) 20 MG tablet Take 20 mg by mouth daily as needed for heartburn or indigestion.   Yes [provider]  fexofenadine (ALLEGRA) 180 MG tablet Take 180 mg by mouth daily.   Yes [provider]  furosemide (LASIX) 20 MG tablet Take 1 tablet (20 mg total) by mouth daily as needed for edema or fluid. 09/01/21  Yes Gherghe, Vella Redhead, MD  levothyroxine (SYNTHROID) 75 MCG tablet TAKE 1 TABLET DAILY BEFORE BREAKFAST (NEED APPOINTMENT FOR FURTHER REFILLS) Patient taking differently: Take 75 mcg by mouth daily before breakfast. 09/01/21  Yes Shamleffer, Melanie Crazier, MD  Magnesium 250 MG TABS Take 250 mg by mouth daily.   Yes [provider]  metoprolol succinate (TOPROL-XL) 25 MG 24 hr tablet Take 1 tablet (25 mg total) by mouth daily. Patient taking differently: Take 25 mg by mouth at bedtime. 06/20/21  Yes Jerline Pain, MD  mirtazapine (REMERON) 30 MG tablet Take 30 mg by mouth at bedtime. 06/13/21  Yes [provider]  potassium chloride SA (KLOR-CON M) 20 MEQ tablet Take 1 tablet (20 mEq total) by mouth daily as needed (Take potassium only when you take the Furosemide (Lasix)). 09/01/21  Yes Caren Griffins, MD  pravastatin (PRAVACHOL) 40 MG tablet Take 40 mg by mouth at bedtime.   Yes [provider]  predniSONE (DELTASONE) 20 MG tablet Take 2 tablets (40 mg total) by mouth daily with breakfast. 09/02/21  Yes Gherghe, Vella Redhead, MD  sodium chloride (OCEAN) 0.65 % SOLN nasal spray Place 1 spray into both nostrils daily as needed for congestion.   Yes [provider]  Spacer/Aero-Holding Chambers (AEROCHAMBER MV) inhaler Use as instructed 10/13/18   Baird Lyons D, MD  Vilazodone HCl 20 MG TABS Take 20 mg by  mouth daily. Patient not taking: Reported on 09/03/2021    [provider]    Physical Exam: Vitals:   09/03/21 1330 09/03/21 1430 09/03/21 1536 09/03/21 1546  BP: (!) 115/93 (!) 124/55    Pulse: 79 77  Resp: 19 20    Temp:   98.6 F (37 C)   TempSrc:   Oral   SpO2: 98% 96%    Weight:    79.8 kg  Height:    5' 7"  (1.702 m)   Physical Exam Vitals and nursing note reviewed.  Constitutional:      General: She is awake. She is not in acute distress.    Appearance: Normal appearance. She is overweight. She is ill-appearing.  HENT:     Head: Normocephalic.     Nose: No rhinorrhea.     Mouth/Throat:     Mouth: Mucous membranes are moist.  Eyes:     General: No scleral icterus.    Pupils: Pupils are equal, round, and reactive to light.  Neck:     Vascular: No JVD.  Cardiovascular:     Rate and Rhythm: Normal rate and regular rhythm.     Heart sounds: S1 normal and S2 normal.  Pulmonary:     Effort: Pulmonary effort is normal.     Breath sounds: Normal breath sounds.  Abdominal:     General: Bowel sounds are normal. There is no distension.     Palpations: Abdomen is soft.  Musculoskeletal:     Cervical back: Neck supple.     Right lower leg: No edema.     Left lower leg: No edema.  Skin:    General: Skin is warm and dry.  Neurological:     General: No focal deficit present.     Mental Status: She is alert and oriented to person, place, and time.  Psychiatric:        Mood and Affect: Mood normal.        Behavior: Behavior normal. Behavior is cooperative.    Data Reviewed:  Results are pending, will review when available.  Assessment and Plan: Principal Problem:   Syncope Observation/PCU. Supplemental oxygen as needed. Negative troponin level. Repeat EKG in a.m. Obtain echocardiogram. Check carotid Doppler.  Active Problems:   NHL (non-Hodgkin's lymphoma) (HCC) Continue prednisone. Oncology planning chemotherapy soon.    Pancytopenia  ([place]) Secondary to above. Monitor CBC closely. Transfuse as needed.    Generalized anxiety disorder Continue Remeron 30 mg p.o. bedtime. Continue bupropion 150 mg p.o. daily. Continue alprazolam 0.5 mg as needed for anxiety.    PAF (paroxysmal atrial fibrillation) (HCC) CHA2DS2-VASc Score of at least 6. Continue apixaban 5 mg p.o. twice daily. Continue amiodarone 200 mg p.o. daily. Continue metoprolol succinate 25 mg p.o. daily.    Hyperlipidemia Continue pravastatin 40 mg p.o. daily.    Essential hypertension Continue metoprolol succinate 25 mg p.o. daily. Monitor blood pressure and heart rate.    Gastroesophageal reflux disease without esophagitis Started pantoprazole 40 mg p.o. daily.    (HFpEF) heart failure with preserved ejection fraction (HCC) No signs of decompensation.    OSA (obstructive sleep apnea) Not not on CPAP.    Hypothyroidism TSH low. Hold levothyroxine. Adjust dosage as needed.    Prediabetes Carbohydrate modified diet.  Check CBG 3 times a day    Advance Care Planning:   Code Status: Full Code   Consults: Oncology (Dr. Benay Spice)   Family Communication: Her husband, son and granddaughter were present in the ED room.  Severity of Illness: The appropriate patient status for this patient is OBSERVATION. Observation status is judged to be reasonable and necessary in order to provide the required intensity of service to ensure the patient's safety. The patient's presenting symptoms, physical exam findings,  and initial radiographic and laboratory data in the context of their medical condition is felt to place them at decreased risk for further clinical deterioration. Furthermore, it is anticipated that the patient will be medically stable for discharge from the hospital within 2 midnights of admission.   Author: Reubin Milan, MD 09/03/2021 4:18 PM  For on call review [email or web address].   This document was prepared using Dragon voice recognition  software and may contain some unintended transcription errors.

## 2021-09-03 NOTE — ED Notes (Signed)
Lactic acid 3.5- MD notified

## 2021-09-03 NOTE — Progress Notes (Signed)
Peripherally Inserted Central Catheter Placement  The IV Nurse has discussed with the patient and/or persons authorized to consent for the patient, the purpose of this procedure and the potential benefits and risks involved with this procedure.  The benefits include less needle sticks, lab draws from the catheter, and the patient may be discharged home with the catheter. Risks include, but not limited to, infection, bleeding, blood clot (thrombus formation), and puncture of an artery; nerve damage and irregular heartbeat and possibility to perform a PICC exchange if needed/ordered by physician.  Alternatives to this procedure were also discussed.  [facility] [employer] PICC patient education guide, fact sheet on infection prevention and patient information card has been provided to patient /or left at bedside.    PICC Placement Documentation  PICC Single Lumen 33/29/51 Right Basilic 38 cm 0 cm (Active)  Indication for Insertion or Continuance of Line Administration of hyperosmolar/irritating solutions (i.e. TPN, Vancomycin, etc.);Home intravenous therapies (PICC only) 09/03/21 2118  Exposed Catheter (cm) 0 cm 09/03/21 2118  Site Assessment Clean, Dry, Intact 09/03/21 2118  Line Status Flushed;Saline locked;Blood return noted 09/03/21 2118  Dressing Type Transparent;Securing device 09/03/21 2118  Dressing Status Antimicrobial disc in place;Clean, Dry, Intact 09/03/21 2118  Dressing Intervention New dressing 09/03/21 2118  Dressing Change Due 09/10/21 09/03/21 2118       Shon Hale 09/03/2021, 9:22 PM

## 2021-09-03 NOTE — ED Triage Notes (Signed)
Pt arrives w/ c/o weakness, spitting up blood since this morning. Pt reports recently being dx w/ lymphoma. PCP told her to come to ED for admission

## 2021-09-03 NOTE — ED Provider Notes (Signed)
[name] DEPT Provider Note   CSN: [id number] Arrival date & time: 09/03/21  1128     History  Chief Complaint  Patient presents with   Weakness    [name] [name] is a 76 y.o. female.  Pt is a 76 yo female with a pmhx significant for sleep apnea, htn, hld, gerd, htn, depression, ckd, afib (on Eliquis), new dx of lymphoma.  The pt said she has been very weak.  The pt's husband said he was recently admitted and received a blood transfusion.  The pt was ok for a little bit.  This am, pt was very weak and "spit up some blood."  They called Dr. Benay Spice who told pt to come to the ED.  Pt had a near syncopal event in the bathroom while here.  The nurse said pt was really limp in the bathroom and required maximum assistance to get her back in the wheelchair.       Home Medications Prior to Admission medications   Medication Sig Start Date End Date Taking? Authorizing Provider  acetaminophen (TYLENOL) 500 MG tablet Take 1,000 mg by mouth every 6 (six) hours as needed for moderate pain.   Yes [provider]  albuterol (VENTOLIN HFA) 108 (90 Base) MCG/ACT inhaler Inhale 1-2 puffs into the lungs every 4 (four) hours as needed for wheezing or shortness of breath. 07/22/18  Yes Parrett, Fonnie Mu, NP  apixaban (ELIQUIS) 5 MG TABS tablet Take 1 tablet (5 mg total) by mouth 2 (two) times daily. 03/27/21 03/22/22 Yes Loel Dubonnet, NP  ALPRAZolam Duanne Moron) 0.5 MG tablet Take 1 tablet (0.5 mg total) by mouth 3 (three) times daily as needed for anxiety. 03/11/21   Owens Shark, NP  amiodarone (PACERONE) 200 MG tablet Taking one tablet by mouth daily Patient taking differently: Take 200 mg by mouth daily. 05/22/21   Fenton, Clint R, PA  Ascorbic Acid (VITAMIN C WITH ROSE HIPS) 1000 MG tablet Take 1,000 mg by mouth daily.    [provider]  benzonatate (TESSALON) 200 MG capsule Take 1 capsule by mouth three times daily as needed for cough Patient not  taking: Reported on 07/22/2021 05/22/19   Parrett, Fonnie Mu, NP  buPROPion (WELLBUTRIN XL) 300 MG 24 hr tablet Take 300 mg by mouth daily. 08/02/21   [provider]  Calcium Carb-Cholecalciferol (CALCIUM 500 + D PO) Take 1 tablet by mouth daily.    [provider]  Coenzyme Q10 (CO Q-10) 100 MG CAPS Take 100 mg by mouth daily.    [provider]  Cyanocobalamin (B-12) 2000 MCG TABS Take 2,000 mcg by mouth daily.    [provider]  doxycycline (VIBRAMYCIN) 100 MG capsule Take 100 mg by mouth daily.    [provider]  famotidine (PEPCID) 20 MG tablet Take 20 mg by mouth daily as needed for heartburn or indigestion.    [provider]  fexofenadine (ALLEGRA) 180 MG tablet Take 180 mg by mouth daily.    [provider]  furosemide (LASIX) 20 MG tablet Take 1 tablet (20 mg total) by mouth daily as needed for edema or fluid. 09/01/21   Caren Griffins, MD  levothyroxine (SYNTHROID) 75 MCG tablet TAKE 1 TABLET DAILY BEFORE BREAKFAST (NEED APPOINTMENT FOR FURTHER REFILLS) 09/01/21   Shamleffer, Melanie Crazier, MD  Magnesium 250 MG TABS Take 250 mg by mouth daily.    [provider]  metoprolol succinate (TOPROL-XL) 25 MG 24 hr tablet  Take 1 tablet (25 mg total) by mouth daily. 06/20/21   Jerline Pain, MD  mirtazapine (REMERON) 30 MG tablet Take 1 tablet by mouth at bedtime. 06/13/21   [provider]  potassium chloride SA (KLOR-CON M) 20 MEQ tablet Take 1 tablet (20 mEq total) by mouth daily as needed (Take potassium only when you take the Furosemide (Lasix)). 09/01/21   Caren Griffins, MD  pravastatin (PRAVACHOL) 40 MG tablet Take 40 mg by mouth at bedtime.    [provider]  predniSONE (DELTASONE) 20 MG tablet Take 2 tablets (40 mg total) by mouth daily with breakfast. 09/02/21   Caren Griffins, MD  sodium chloride (OCEAN) 0.65 % SOLN nasal spray Place 1 spray into both nostrils daily as needed for congestion.     [provider]  Spacer/Aero-Holding Chambers (AEROCHAMBER MV) inhaler Use as instructed 10/13/18   Baird Lyons D, MD  Vilazodone HCl 20 MG TABS Take 1 tablet by mouth daily.    [provider]      Allergies    Omnicef [cefdinir], Bacitracin, Bacitracin-polymyxin b, Duloxetine hcl, Lexapro [escitalopram], Mirtazapine, Penicillins, Peroxide [hydrogen peroxide], Zoloft [sertraline hcl], and Neosporin [neomycin-bacitracin zn-polymyx]    Review of Systems   Review of Systems  Neurological:  Positive for weakness.  All other systems reviewed and are negative.   Physical Exam Updated Vital Signs BP (!) 124/55   Pulse 77   Temp 98.7 F (37.1 C) (Oral)   Resp 20   SpO2 96%  Physical Exam Vitals and nursing note reviewed.  Constitutional:      Appearance: Normal appearance.  HENT:     Head: Normocephalic and atraumatic.     Nose: Nose normal.     Mouth/Throat:     Mouth: Mucous membranes are moist.     Pharynx: Oropharynx is clear.  Eyes:     Extraocular Movements: Extraocular movements intact.     Conjunctiva/sclera: Conjunctivae normal.     Pupils: Pupils are equal, round, and reactive to light.  Cardiovascular:     Rate and Rhythm: Normal rate and regular rhythm.     Pulses: Normal pulses.     Heart sounds: Normal heart sounds.  Pulmonary:     Effort: Pulmonary effort is normal.     Breath sounds: Normal breath sounds.  Abdominal:     General: Abdomen is flat. Bowel sounds are normal.     Palpations: Abdomen is soft.  Musculoskeletal:        General: Normal range of motion.     Cervical back: Normal range of motion and neck supple.  Skin:    General: Skin is warm.     Capillary Refill: Capillary refill takes less than 2 seconds.  Neurological:     General: No focal deficit present.     Mental Status: She is alert and oriented to person, place, and time.  Psychiatric:        Mood and Affect: Mood normal.        Behavior: Behavior normal.      ED Results / Procedures / Treatments   Labs (all labs ordered are listed, but only abnormal results are displayed) Labs Reviewed  BASIC METABOLIC PANEL - Abnormal; Notable for the following components:      Result Value   Chloride 113 (*)    CO2 20 (*)    Glucose, Bld 102 (*)    BUN 25 (*)    Creatinine, Ser 1.43 (*)    Calcium  8.6 (*)    GFR, Estimated 38 (*)    All other components within normal limits  CBC - Abnormal; Notable for the following components:   WBC 2.1 (*)    RBC 3.25 (*)    Hemoglobin 9.6 (*)    HCT 32.2 (*)    MCHC 29.8 (*)    RDW 19.3 (*)    Platelets 75 (*)    All other components within normal limits  URINALYSIS, ROUTINE W REFLEX MICROSCOPIC - Abnormal; Notable for the following components:   Color, Urine STRAW (*)    All other components within normal limits  LACTIC ACID, PLASMA - Abnormal; Notable for the following components:   Lactic Acid, Venous 3.5 (*)    All other components within normal limits  TSH - Abnormal; Notable for the following components:   TSH 0.149 (*)    All other components within normal limits  CBG MONITORING, ED - Abnormal; Notable for the following components:   Glucose-Capillary 102 (*)    All other components within normal limits  SARS CORONAVIRUS 2 BY RT PCR  MAGNESIUM  LACTIC ACID, PLASMA  TYPE AND SCREEN  TROPONIN I (HIGH SENSITIVITY)  TROPONIN I (HIGH SENSITIVITY)    EKG EKG Interpretation  Date/Time:  Wednesday September 03 2021 12:39:43 EDT Ventricular Rate:  84 PR Interval:  169 QRS Duration: 89 QT Interval:  380 QTC Calculation: 450 R Axis:   69 Text Interpretation: Sinus rhythm Low voltage, extremity and precordial leads No significant change since last tracing Confirmed by Isla Pence [phone]) on 09/03/2021 1:43:20 PM  Radiology [place] EKG SITE RITE  Result Date: 09/03/2021 If Site Rite image not attached, placement could not be confirmed due to current cardiac rhythm.  DG Chest Portable 1  View  Result Date: 09/03/2021 CLINICAL DATA:  Provided history: Hematemesis. Additional history provided: Weakness, recent diagnosis of lymphoma. EXAM: PORTABLE CHEST 1 VIEW COMPARISON:  Head CT 08/27/2021. Prior chest radiographs 04/20/2021 and earlier. FINDINGS: Heart size at the upper limits of normal. Minimal atelectasis within the left lung base. No appreciable airspace consolidation or pulmonary edema. No evidence of pleural effusion or pneumothorax. No acute bony abnormality identified. Dextrocurvature of the thoracic spine. IMPRESSION: Minimal atelectasis within the left lung base. Otherwise, no evidence of acute cardiopulmonary abnormality. Electronically Signed   By: Kellie Simmering D.O.   On: 09/03/2021 12:20    Procedures Procedures    Medications Ordered in ED Medications  sodium chloride 0.9 % bolus 1,000 mL (has no administration in time range)  sodium chloride 0.9 % bolus 1,000 mL (0 mLs Intravenous Stopped 09/03/21 1415)    ED Course/ Medical Decision Making/ A&P                           Medical Decision Making Amount and/or Complexity of Data Reviewed Labs: ordered. Radiology: ordered.  Risk Decision regarding hospitalization.   This patient presents to the ED for concern of weakness, this involves an extensive number of treatment options, and is a complaint that carries with it a high risk of complications and morbidity.  The differential diagnosis includes anemia, infection, dehydration   Co morbidities that complicate the patient evaluation  sleep apnea, htn, hld, gerd, htn, depression, ckd, afib (on Eliquis), new dx of lymphoma   Additional history obtained:  Additional history obtained from epic chart review External records from outside source obtained and reviewed including husband   Lab Tests:  I Ordered, and personally  interpreted labs.  The pertinent results include:  ua nl; cbc with pancytopenia (wbc 2.1, hgb 9.6, hct 32.2; plt 75).  This is all  chronic.  Bmp with bun 25 and cr 1.43; lactic elevated at 3.5; trop 11   Imaging Studies ordered:  I ordered imaging studies including cxr  I independently visualized and interpreted imaging which showed  IMPRESSION:  Minimal atelectasis within the left lung base.    Otherwise, no evidence of acute cardiopulmonary abnormality.   I agree with the radiologist interpretation   Cardiac Monitoring:  The patient was maintained on a cardiac monitor.  I personally viewed and interpreted the cardiac monitored which showed an underlying rhythm of: nsr   Medicines ordered and prescription drug management:  I ordered medication including ivfs  for orthostasis  Reevaluation of the patient after these medicines showed that the patient improved I have reviewed the patients home medicines and have made adjustments as needed   Critical Interventions:  ivfs   Consultations Obtained:  I requested consultation with the oncologist (Dr. Benay Spice),  and discussed lab and imaging findings as well as pertinent plan -he will see pt in consult Pt d/w Dr. Olevia Bowens (triad) for admission  Problem List / ED Course:  Pancytopenia:  likely due to lymphoma Dehydration:  pt improved after ivfs   Reevaluation:  After the interventions noted above, I reevaluated the patient and found that they have :improved   Social Determinants of Health:  Lives at home   Dispostion:  After consideration of the diagnostic results and the patients response to treatment, I feel that the patent would benefit from admission.          Final Clinical Impression(s) / ED Diagnoses Final diagnoses:  Generalized weakness  Pancytopenia ([place])  Dehydration    Rx / DC Orders ED Discharge Orders     None         Isla Pence, MD 09/03/21 1448

## 2021-09-03 NOTE — ED Notes (Signed)
Moving pt to her room upstairs

## 2021-09-03 NOTE — Telephone Encounter (Signed)
Mrs. Laduke daughter called and stated hr mother was coughing up blood and she fall going to the bathroom. It take three of them to pull her up. I advice the patient to go the [facility] ER. Dr Benay Spice / Lattie Haw agreed the patient needs to go the ER.

## 2021-09-03 NOTE — Progress Notes (Signed)

## 2021-09-03 NOTE — ED Notes (Signed)
PICC team at bedside for line placement.

## 2021-09-04 ENCOUNTER — Other Ambulatory Visit: Payer: Self-pay

## 2021-09-04 ENCOUNTER — Observation Stay (HOSPITAL_COMMUNITY): Payer: Medicare HMO

## 2021-09-04 ENCOUNTER — Other Ambulatory Visit (HOSPITAL_COMMUNITY): Payer: Medicare HMO

## 2021-09-04 ENCOUNTER — Inpatient Hospital Stay: Payer: Medicare HMO | Admitting: Nurse Practitioner

## 2021-09-04 ENCOUNTER — Inpatient Hospital Stay: Payer: Medicare HMO

## 2021-09-04 DIAGNOSIS — R042 Hemoptysis: Secondary | ICD-10-CM | POA: Diagnosis present

## 2021-09-04 DIAGNOSIS — Z0189 Encounter for other specified special examinations: Secondary | ICD-10-CM

## 2021-09-04 DIAGNOSIS — N1832 Chronic kidney disease, stage 3b: Secondary | ICD-10-CM | POA: Diagnosis present

## 2021-09-04 DIAGNOSIS — Z66 Do not resuscitate: Secondary | ICD-10-CM | POA: Diagnosis present

## 2021-09-04 DIAGNOSIS — D689 Coagulation defect, unspecified: Secondary | ICD-10-CM | POA: Diagnosis present

## 2021-09-04 DIAGNOSIS — E039 Hypothyroidism, unspecified: Secondary | ICD-10-CM | POA: Diagnosis present

## 2021-09-04 DIAGNOSIS — F32A Depression, unspecified: Secondary | ICD-10-CM | POA: Diagnosis present

## 2021-09-04 DIAGNOSIS — K219 Gastro-esophageal reflux disease without esophagitis: Secondary | ICD-10-CM | POA: Diagnosis present

## 2021-09-04 DIAGNOSIS — Z888 Allergy status to other drugs, medicaments and biological substances status: Secondary | ICD-10-CM | POA: Diagnosis not present

## 2021-09-04 DIAGNOSIS — F411 Generalized anxiety disorder: Secondary | ICD-10-CM | POA: Diagnosis present

## 2021-09-04 DIAGNOSIS — E860 Dehydration: Secondary | ICD-10-CM | POA: Diagnosis present

## 2021-09-04 DIAGNOSIS — D61818 Other pancytopenia: Secondary | ICD-10-CM | POA: Diagnosis present

## 2021-09-04 DIAGNOSIS — I5032 Chronic diastolic (congestive) heart failure: Secondary | ICD-10-CM | POA: Diagnosis present

## 2021-09-04 DIAGNOSIS — E8809 Other disorders of plasma-protein metabolism, not elsewhere classified: Secondary | ICD-10-CM | POA: Diagnosis present

## 2021-09-04 DIAGNOSIS — R55 Syncope and collapse: Secondary | ICD-10-CM | POA: Diagnosis present

## 2021-09-04 DIAGNOSIS — N179 Acute kidney failure, unspecified: Secondary | ICD-10-CM | POA: Diagnosis present

## 2021-09-04 DIAGNOSIS — C8330 Diffuse large B-cell lymphoma, unspecified site: Secondary | ICD-10-CM | POA: Diagnosis present

## 2021-09-04 DIAGNOSIS — Z20822 Contact with and (suspected) exposure to covid-19: Secondary | ICD-10-CM | POA: Diagnosis present

## 2021-09-04 DIAGNOSIS — I4819 Other persistent atrial fibrillation: Secondary | ICD-10-CM | POA: Diagnosis present

## 2021-09-04 DIAGNOSIS — Z96652 Presence of left artificial knee joint: Secondary | ICD-10-CM | POA: Diagnosis present

## 2021-09-04 DIAGNOSIS — C8598 Non-Hodgkin lymphoma, unspecified, lymph nodes of multiple sites: Secondary | ICD-10-CM | POA: Diagnosis not present

## 2021-09-04 DIAGNOSIS — E785 Hyperlipidemia, unspecified: Secondary | ICD-10-CM | POA: Diagnosis present

## 2021-09-04 DIAGNOSIS — I130 Hypertensive heart and chronic kidney disease with heart failure and stage 1 through stage 4 chronic kidney disease, or unspecified chronic kidney disease: Secondary | ICD-10-CM | POA: Diagnosis present

## 2021-09-04 DIAGNOSIS — E872 Acidosis, unspecified: Secondary | ICD-10-CM | POA: Diagnosis present

## 2021-09-04 DIAGNOSIS — Z880 Allergy status to penicillin: Secondary | ICD-10-CM | POA: Diagnosis not present

## 2021-09-04 DIAGNOSIS — G4733 Obstructive sleep apnea (adult) (pediatric): Secondary | ICD-10-CM | POA: Diagnosis present

## 2021-09-04 LAB — DIFFERENTIAL
Abs Immature Granulocytes: 0.02 10*3/uL (ref 0.00–0.07)
Basophils Absolute: 0 10*3/uL (ref 0.0–0.1)
Basophils Relative: 0 %
Eosinophils Absolute: 0 10*3/uL (ref 0.0–0.5)
Eosinophils Relative: 0 %
Immature Granulocytes: 1 %
Lymphocytes Relative: 16 %
Lymphs Abs: 0.3 10*3/uL — ABNORMAL LOW (ref 0.7–4.0)
Monocytes Absolute: 0.3 10*3/uL (ref 0.1–1.0)
Monocytes Relative: 15 %
Neutro Abs: 1.3 10*3/uL — ABNORMAL LOW (ref 1.7–7.7)
Neutrophils Relative %: 68 %

## 2021-09-04 LAB — COMPREHENSIVE METABOLIC PANEL
ALT: 34 U/L (ref 0–44)
AST: 43 U/L — ABNORMAL HIGH (ref 15–41)
Albumin: 1.9 g/dL — ABNORMAL LOW (ref 3.5–5.0)
Alkaline Phosphatase: 73 U/L (ref 38–126)
Anion gap: 4 — ABNORMAL LOW (ref 5–15)
BUN: 26 mg/dL — ABNORMAL HIGH (ref 8–23)
CO2: 22 mmol/L (ref 22–32)
Calcium: 8 mg/dL — ABNORMAL LOW (ref 8.9–10.3)
Chloride: 114 mmol/L — ABNORMAL HIGH (ref 98–111)
Creatinine, Ser: 1.3 mg/dL — ABNORMAL HIGH (ref 0.44–1.00)
GFR, Estimated: 43 mL/min — ABNORMAL LOW (ref >60)
Glucose, Bld: 129 mg/dL — ABNORMAL HIGH (ref 70–99)
Potassium: 4 mmol/L (ref 3.5–5.1)
Sodium: 140 mmol/L (ref 135–145)
Total Bilirubin: 0.8 mg/dL (ref 0.3–1.2)
Total Protein: 4.3 g/dL — ABNORMAL LOW (ref 6.5–8.1)

## 2021-09-04 LAB — URIC ACID: Uric Acid, Serum: 5.2 mg/dL (ref 2.5–7.1)

## 2021-09-04 LAB — ECHOCARDIOGRAM COMPLETE
AR max vel: 2.33 cm2
AV Peak grad: 12.1 mmHg
Ao pk vel: 1.74 m/s
Area-P 1/2: 3.65 cm2
Height: 67 in
S' Lateral: 2.3 cm
Weight: 3164.04 oz

## 2021-09-04 LAB — CBC
HCT: 24.2 % — ABNORMAL LOW (ref 36.0–46.0)
Hemoglobin: 7.4 g/dL — ABNORMAL LOW (ref 12.0–15.0)
MCH: 29.4 pg (ref 26.0–34.0)
MCHC: 30.6 g/dL (ref 30.0–36.0)
MCV: 96 fL (ref 80.0–100.0)
Platelets: 64 10*3/uL — ABNORMAL LOW (ref 150–400)
RBC: 2.52 MIL/uL — ABNORMAL LOW (ref 3.87–5.11)
RDW: 18.9 % — ABNORMAL HIGH (ref 11.5–15.5)
WBC: 1.9 10*3/uL — ABNORMAL LOW (ref 4.0–10.5)
nRBC: 0 % (ref 0.0–0.2)

## 2021-09-04 LAB — GLUCOSE, CAPILLARY
Glucose-Capillary: 87 mg/dL (ref 70–99)
Glucose-Capillary: 119 mg/dL — ABNORMAL HIGH (ref 70–99)

## 2021-09-04 MED ORDER — SODIUM CHLORIDE 0.9 % IV SOLN: INTRAVENOUS | Status: DC

## 2021-09-04 MED ORDER — ORAL CARE MOUTH RINSE
15.0000 mL | OROMUCOSAL | Status: DC | PRN
Start: 2021-09-04 — End: 2021-09-10

## 2021-09-04 MED ORDER — SODIUM CHLORIDE 0.9 % IV BOLUS
500.0000 mL | Freq: Once | INTRAVENOUS | Status: AC
  Administered 2021-09-04: 500 mL via INTRAVENOUS

## 2021-09-04 MED ORDER — ALLOPURINOL 300 MG PO TABS
150.0000 mg | ORAL_TABLET | Freq: Every day | ORAL | Status: DC
  Administered 2021-09-04 – 2021-09-10 (×7): 150 mg via ORAL
  Filled 2021-09-04 (×7): qty 1

## 2021-09-04 NOTE — TOC Initial Note (Signed)
Transition of Care [facility]) - Initial/Assessment Note    Patient Details  Name: [name] [name] MRN: [id number] Date of Birth: Apr 08, 1945  Transition [facility]) CM/SW Contact:    Leeroy Cha, RN Phone Number: 09/04/2021, 8:59 AM  Clinical Narrative:                  Transition of Care [facility]) Screening Note   Patient Details  Name: [name] [name] Date of Birth: [birth year]   Transition of Care [facility]) CM/SW Contact:    Leeroy Cha, RN Phone Number: 09/04/2021, 8:59 AM    Transition of Care Department [facility]) has reviewed patient and no TOC needs have been identified at this time. We will continue to monitor patient advancement through interdisciplinary progression rounds. If new patient transition needs arise, please place a TOC consult.    Expected Discharge Plan: Home/Self Care Barriers to Discharge: Continued Medical Work up   Patient Goals and CMS Choice Patient states their goals for this hospitalization and ongoing recovery are:: to go home CMS Medicare.gov Compare Post Acute Care list provided to:: Patient Choice offered to / list presented to : Patient  Expected Discharge Plan and Services Expected Discharge Plan: Home/Self Care   Discharge Planning Services: CM Consult   Living arrangements for the past 2 months: Single Family Home                                      Prior Living Arrangements/Services Living arrangements for the past 2 months: [place] Lives with:: Spouse Patient language and need for interpreter reviewed:: Yes Do you feel safe going back to the place where you live?: Yes            Criminal Activity/Legal Involvement Pertinent to Current Situation/Hospitalization: No - Comment as needed  Activities of Daily Living Home Assistive Devices/Equipment: Tanzi (specify type) ADL Screening (condition at time of admission) Patient's cognitive ability adequate to safely complete daily activities?: Yes Is the patient  deaf or have difficulty hearing?: No Does the patient have difficulty seeing, even when wearing glasses/contacts?: No Does the patient have difficulty concentrating, remembering, or making decisions?: Yes Patient able to express need for assistance with ADLs?: Yes Does the patient have difficulty dressing or bathing?: Yes Independently performs ADLs?: No Does the patient have difficulty walking or climbing stairs?: Yes Weakness of Legs: Both Weakness of Arms/Hands: Both  Permission Sought/Granted                  Emotional Assessment   Attitude/Demeanor/Rapport: Engaged Affect (typically observed): Calm Orientation: : Oriented to Situation, Oriented to  Time, Oriented to Place, Oriented to Self Alcohol / Substance Use: Not Applicable Psych Involvement: No (comment)  Admission diagnosis:  Dehydration [E86.0] Syncope [R55] Generalized weakness [R53.1] Pancytopenia ([place]) [D61.818] Patient Active Problem List   Diagnosis Date Noted   NHL (non-Hodgkin's lymphoma) ([place]) 09/03/2021   Syncope 09/03/2021   Acquired thrombophilia ([place]) 09/03/2021   Loss of appetite 09/03/2021   Moderate recurrent major depression ([place]) 09/03/2021   Other specified disorders of bone density and structure, other site 09/03/2021   Rectocele 09/03/2021   Prediabetes 09/03/2021   Pancytopenia ([place]) 08/29/2021   Generalized weakness 08/28/2021   Splenomegaly 08/20/2021   Secondary hypercoagulable state ([place]) 04/28/2021   Chronic kidney disease, stage 3 unspecified ([place]) 03/26/2021   Hardening of the aorta (main artery of the heart) ([place])  03/26/2021   Anemia 08/02/2020   Acute bronchitis 06/21/2020   Gait instability 08/01/2019   Hypothyroidism 04/04/2018   Long term current use of amiodarone 10/19/2017   OSA (obstructive sleep apnea) 10/19/2017   Chronic rhinitis 06/16/2017   Hoarseness 05/12/2017   Upper airway cough syndrome 03/19/2017   Rhinitis, allergic 03/19/2017   Severe recurrent major  depression without psychotic features ([place]) 11/25/2016    Class: Chronic   Generalized anxiety disorder 11/25/2016    Class: Chronic   Persistent atrial fibrillation ([place]) 10/31/2016   Pruritus 10/31/2016   Primary localized osteoarthritis of left knee 09/21/2016   (HFpEF) heart failure with preserved ejection fraction ([place]) 07/06/2016   Chest pain 07/06/2016   Fatigue 07/06/2016   Dyspnea on exertion 04/03/2016   Mitral regurgitation 02/27/2016   PAF (paroxysmal atrial fibrillation) ([place]) 02/17/2016   Hyperlipidemia    Essential hypertension    Chronic cough    Gastroesophageal reflux disease without esophagitis    Bilateral carpal tunnel syndrome 11/06/2015   Cervical spondylosis without myelopathy 11/06/2015   Primary osteoarthritis of both first carpometacarpal joints 11/06/2015   Hx of colonic polyps 05/24/2014   S/P lumbar laminectomy 04/05/2013   Benign neoplasm of colon 12/29/2011   PCP:  Harlan Stains, MD Pharmacy:   [place], [place] - [address] [place] [place] [address] Phone: [phone] Fax: [phone]  [facility] [place], [place] [address] Phone: [phone] Fax: [phone]     Social Determinants of Health (SDOH) Interventions    Readmission Risk Interventions   Row Labels 09/01/2021    8:52 AM  Readmission Risk Prevention Plan   Section Header. No data exists in this row.   Transportation Screening   Complete  PCP or Specialist Appt within 3-5 Days   Complete  HRI or [place]   Complete  Social Work Consult for [place] Planning/Counseling   Complete  Palliative Care Screening   Not Applicable  Medication Review [employer] photographer)   Complete

## 2021-09-04 NOTE — Progress Notes (Addendum)
PROGRESS NOTE   [name] [name]  GEZ:[id number]    DOB: 06/21/1945    DOA: 09/03/2021  PCP: Harlan Stains, MD   I have briefly reviewed patients previous medical records in [facility].  Chief Complaint  Patient presents with   Weakness    Brief Narrative:  76 year old female with PMH of persistent A-fib, stage III CKD, anxiety and depression, GERD, HLD, HTN, prediabetes, OSA on no CPAP, hypothyroidism, recent hospital admission 08/28/2021 - 09/01/2021 for generalized weakness, poor appetite, nightly chills over the last year and a half, and a 40 pound weight loss, underwent lymph node biopsy, newly diagnosed with diffuse large B cell lymphoma, seen in oncology office on 8/22 with plans to initiate systemic chemotherapy with R-CHOP next week, readmitted to the hospital on 09/03/2021 due to worsening fatigue, weakness and a syncopal episode while in the ED.  Oncology consulted and plan for cycle of R-CHOP in the hospital on 8/25.   Assessment & Plan:  Principal Problem:   Syncope Active Problems:   Generalized anxiety disorder   PAF (paroxysmal atrial fibrillation) (HCC)   Hyperlipidemia   Essential hypertension   Gastroesophageal reflux disease without esophagitis   (HFpEF) heart failure with preserved ejection fraction (HCC)   OSA (obstructive sleep apnea)   Hypothyroidism   Pancytopenia (HCC)   NHL (non-Hodgkin's lymphoma) (HCC)   Prediabetes   Large B cell lymphoma: History as noted above.  New diagnosis.  Oncology input appreciated and plan to start first cycle of R-CHOP in the hospital on 8/25.  Continuing prednisone 40 Mg daily and starting allopurinol.  Final pathology from inguinal lymph node biopsy confirms diffuse large B-cell lymphoma.  Further management per oncology.  Pancytopenia: Secondary to lymphoma.  Follow CBCs closely.  Transfuse PRBC if hemoglobin 7 g or less, platelets if counts less than 20 K or bleeding and G-CSF if neutropenic <1  K.  Hemoptysis: Suspect low volume, "spit up some blood".  Unclear etiology.  Is on apixaban and also has thrombocytopenia as above.  No further episodes.  As discussed with Dr. Benay Spice, expect thrombocytopenia to worsen after chemo and risks of bleeding outweigh benefits of apixaban anticoagulation for stroke prophylaxis, hence discontinued Eliquis for now.  Syncope: Reportedly occurred in ED triage on 8/23.  Bolused with 2 L of IV saline.  HS Troponin x2 negative at 11 > 9.  TTE: LVEF 65-70%, no aortic stenosis.  No significant change from prior study.  CUS: Bilateral ICA consistent with 1-39% stenosis.  Check orthostatics.  If patient drives, should be advised not to drive for 6 months.  Anxiety disorder: Continue home dose of Remeron, bupropion and alprazolam.  Persistent atrial fibrillation: CHA2DS2-VASc score: 6.  Continue amiodarone, Toprol-XL.  Eliquis discontinued for now.  Hyperlipidemia: Continue pravastatin  Essential hypertension: Controlled on metoprolol succinate, continue  GERD without esophagitis: Continue pantoprazole  Chronic diastolic CHF: Clinically euvolemic.  OSA, not on CPAP Noted  Hypothyroidism TSH 0.149 in the context of acute illness.  Clinically euthyroid.  Check free T3 and free T4, will likely resume prior home dose of Synthroid.  Prediabetes A1c 5.8 in 2018.  Good glycemic control since hospital admission.  DC'd CBGs.  Outpatient follow-up.  Chronic kidney disease stage IIIb Baseline creatinine probably in the 1.4-1.6 range, current creatinine is better.  Monitor.  Elevated lactate: Resolved.  Body mass index is 30.97 kg/m.     DVT prophylaxis: SCDs Start: 09/03/21 1459.     Code Status: DNR:  Family Communication: None at  bedside. Disposition:  Status is: Inpatient Remains inpatient appropriate because: Pancytopenia, due to NHL, plan to start chemotherapy 8/25.  Likely will remain in the hospital 4 to 5 days.     Consultants:    Medical oncology  Procedures:     Antimicrobials:      Subjective:  Seen this morning.  States that she does not recollect events of yesterday.  Reports feeling okay.  Denies chest pain, dyspnea, palpitations, dizziness, lightheadedness, further coughing up of blood.  Reports intermittent chronic low volume rectal bleed from known hemorrhoids.  Objective:   Vitals:   09/04/21 0228 09/04/21 0500 09/04/21 0548 09/04/21 1030  BP: (!) 96/45  (!) 115/51 (!) 117/49  Pulse: 70  92 80  Resp: '17  20 18  '$ Temp: 97.6 F (36.4 C)  97.8 F (36.6 C) 98.2 F (36.8 C)  TempSrc: Oral  Oral Oral  SpO2: 97%  100% 98%  Weight:  89.7 kg    Height:        General exam: Elderly female, small built, frail and chronically ill looking sitting up comfortably in bed without distress.  Appears in good spirits. Respiratory system: Clear to auscultation. Respiratory effort normal. Cardiovascular system: S1 & S2 heard, RRR. No JVD, murmurs, rubs, gallops or clicks. No pedal edema.  Telemetry personally reviewed: Sinus rhythm. Gastrointestinal system: Abdomen is nondistended, soft and nontender. No organomegaly or masses felt. Normal bowel sounds heard. Central nervous system: Alert and oriented. No focal neurological deficits. Extremities: Symmetric 5 x 5 power. Skin: No rashes, lesions or ulcers Psychiatry: Judgement and insight appear normal. Mood & affect appropriate.     Data Reviewed:   I have personally reviewed following labs and imaging studies   CBC: Recent Labs  Lab 09/02/21 1352 09/03/21 1220 09/04/21 0340  WBC 2.4* 2.1* 1.9*  NEUTROABS 1.7  --  1.3*  HGB 8.7* 9.6* 7.4*  HCT 28.1* 32.2* 24.2*  MCV 93.7 99.1 96.0  PLT 74* 75* 64*    Basic Metabolic Panel: Recent Labs  Lab 08/30/21 0543 08/31/21 0541 09/01/21 0519 09/02/21 1352 09/03/21 1220 09/03/21 1224 09/04/21 0340  NA 139  --  141 136 140  --  140  K 4.0  --  3.8 4.1 4.2  --  4.0  CL 113*  --  120* 110 113*  --   114*  CO2 22  --  18* 16* 20*  --  22  GLUCOSE 93  --  164* 174* 102*  --  129*  BUN 35*  --  27* 27* 25*  --  26*  CREATININE 2.07*  --  1.50* 1.30* 1.43*  --  1.30*  CALCIUM 7.8*  --  8.2* 8.2* 8.6*  --  8.0*  MG 1.9 2.0 2.0  --   --  1.8  --   PHOS 3.3 3.1 3.0  --   --   --   --     Liver Function Tests: Recent Labs  Lab 08/29/21 0540 08/30/21 0543 09/01/21 0519 09/02/21 1352 09/04/21 0340  AST '18 19 21 '$ 54* 43*  ALT '9 9 12 19 '$ 34  ALKPHOS 42 39 45 72 73  BILITOT 0.9 0.7 0.6 1.0 0.8  PROT 4.6* 4.2* 4.2* 4.8* 4.3*  ALBUMIN 2.2* 2.0* 2.0* 2.5* 1.9*    CBG: Recent Labs  Lab 09/03/21 1158 09/04/21 0745 09/04/21 1158  GLUCAP 102* 87 119*    Microbiology Studies:   Recent Results (from the past 240 hour(s))  Culture, blood (Routine X  2) w Reflex to ID Panel     Status: None   Collection Time: 08/29/21  9:11 AM   Specimen: BLOOD  Result Value Ref Range Status   Specimen Description   Final    BLOOD LEFT ANTECUBITAL Performed at [facility] [address] [place], [place] [address]    Special Requests   Final    BOTTLES DRAWN AEROBIC AND ANAEROBIC Blood Culture adequate volume Performed at [facility] [address], [place], [place] [address]    Culture   Final    NO GROWTH 5 DAYS Performed at [facility], [place] [address], [place], [place] [address]    Report Status 09/03/2021 FINAL  Final  Culture, blood (Routine X 2) w Reflex to ID Panel     Status: None   Collection Time: 08/29/21  9:27 AM   Specimen: BLOOD  Result Value Ref Range Status   Specimen Description   Final    BLOOD RIGHT ANTECUBITAL Performed at [facility] [address], [place], [place] [address]    Special Requests   Final    BOTTLES DRAWN AEROBIC AND ANAEROBIC Blood Culture adequate volume Performed at [facility] [address] [place], [place] [address]    Culture   Final    NO GROWTH 5  DAYS Performed at [facility], [place] [address] [place], [place] [address]    Report Status 09/03/2021 FINAL  Final  SARS Coronavirus 2 by RT PCR (hospital order, performed in [facility] hospital lab) *cepheid single result test* Anterior Nasal Swab     Status: None   Collection Time: 09/03/21  2:22 PM   Specimen: Anterior Nasal Swab  Result Value Ref Range Status   SARS Coronavirus 2 by RT PCR NEGATIVE NEGATIVE Final    Comment: (NOTE) SARS-CoV-2 target nucleic acids are NOT DETECTED.  The SARS-CoV-2 RNA is generally detectable in upper and lower respiratory specimens during the acute phase of infection. The lowest concentration of SARS-CoV-2 viral copies this assay can detect is 250 copies / mL. A negative result does not preclude SARS-CoV-2 infection and should not be used as the sole basis for treatment or other patient management decisions.  A negative result may occur with improper specimen collection / handling, submission of specimen other than nasopharyngeal swab, presence of viral mutation(s) within the areas targeted by this assay, and inadequate number of viral copies (<250 copies / mL). A negative result must be combined with clinical observations, patient history, and epidemiological information.  Fact Sheet for Patients:   https://www.patel.info/  Fact Sheet for Healthcare Providers: https://hall.com/  This test is not yet approved or  cleared by the [place] FDA and has been authorized for detection and/or diagnosis of SARS-CoV-2 by FDA under an Emergency Use Authorization (EUA).  This EUA will remain in effect (meaning this test can be used) for the duration of the COVID-19 declaration under Section 564(b)(1) of the Act, 21 U.S.C. section 360bbb-3(b)(1), unless the authorization is terminated or revoked sooner.  Performed at [facility], [place] [address], [place], [place] [address]      Radiology Studies:  VAS US CAROTID  Result Date: 09/04/2021 Carotid Arterial Duplex Study Patient Name:  JUDIANNE SEIPLE  Date of Exam:   09/04/2021 Medical Rec #: [id number]      Accession #:    [id number] Date of Birth: 1945-09-07       Patient Gender: F  Patient Age:   27 years Exam Location:  [facility] ([place]) Procedure:      VAS US CAROTID Referring Phys: DAVID ORTIZ --------------------------------------------------------------------------------  Indications:       Syncope. Risk Factors:      Hypertension, hyperlipidemia. Limitations        Today's exam was limited due to the patient's respiratory                    variation. Comparison Study:  No prior studies. Performing Technologist: Oliver Hum RVT  Examination Guidelines: A complete evaluation includes B-mode imaging, spectral Doppler, color Doppler, and power Doppler as needed of all accessible portions of each vessel. Bilateral testing is considered an integral part of a complete examination. Limited examinations for reoccurring indications may be performed as noted.  Right Carotid Findings: +----------+--------+--------+--------+-----------------------+--------+           PSV cm/sEDV cm/sStenosisPlaque Description     Comments +----------+--------+--------+--------+-----------------------+--------+ CCA Prox  92      12                                     tortuous +----------+--------+--------+--------+-----------------------+--------+ CCA Distal73      14              smooth and heterogenous         +----------+--------+--------+--------+-----------------------+--------+ ICA Prox  74      16              smooth and heterogenous         +----------+--------+--------+--------+-----------------------+--------+ ICA Distal65      14                                     tortuous +----------+--------+--------+--------+-----------------------+--------+ ECA       89      4                                                +----------+--------+--------+--------+-----------------------+--------+ +----------+--------+-------+--------+-------------------+           PSV cm/sEDV cmsDescribeArm Pressure (mmHG) +----------+--------+-------+--------+-------------------+ [id number]                                        +----------+--------+-------+--------+-------------------+ +---------+--------+--+--------+-+---------+ VertebralPSV cm/s57EDV cm/s8Antegrade +---------+--------+--+--------+-+---------+  Left Carotid Findings: +----------+-------+-------+--------+---------------------------------+--------+           PSV    EDV    StenosisPlaque Description               Comments           cm/s   cm/s                                                     +----------+-------+-------+--------+---------------------------------+--------+ CCA Prox  95     14                                                       +----------+-------+-------+--------+---------------------------------+--------+  CCA Distal77     14             smooth and heterogenous                   +----------+-------+-------+--------+---------------------------------+--------+ ICA Prox  86     19             calcific, irregular and          tortuous                                 heterogenous                              +----------+-------+-------+--------+---------------------------------+--------+ ICA Distal78     15                                              tortuous +----------+-------+-------+--------+---------------------------------+--------+ ECA       151    14                                                       +----------+-------+-------+--------+---------------------------------+--------+ +----------+--------+--------+--------+-------------------+           PSV cm/sEDV cm/sDescribeArm Pressure (mmHG) +----------+--------+--------+--------+-------------------+ [id number]                                          +----------+--------+--------+--------+-------------------+ +---------+--------+--+--------+--+---------+ VertebralPSV cm/s54EDV cm/s12Antegrade +---------+--------+--+--------+--+---------+   Summary: Right Carotid: Velocities in the right ICA are consistent with a 1-39% stenosis. Left Carotid: Velocities in the left ICA are consistent with a 1-39% stenosis. Vertebrals: Bilateral vertebral arteries demonstrate antegrade flow. *See table(s) above for measurements and observations.  Electronically signed by Monica Martinez MD on 09/04/2021 at 1:02:46 PM.    Final    ECHOCARDIOGRAM COMPLETE  Result Date: 09/04/2021    ECHOCARDIOGRAM REPORT   Patient Name:   RASHAE ROTHER Date of Exam: 09/04/2021 Medical Rec #:  [id number]     Height:       67.0 in Accession #:    [id number]    Weight:       197.8 lb Date of Birth:  September 23, 1945      BSA:          2.013 m Patient Age:    40 years      BP:           115/51 mmHg Patient Gender: F             HR:           83 bpm. Exam Location:  Inpatient Procedure: 2D Echo, Cardiac Doppler and Color Doppler Indications:    Chemotherapy  History:        Patient has prior history of Echocardiogram examinations, most                 recent 08/09/2020. Risk Factors:Sleep Apnea, Diabetes and                 Hypertension.  Sonographer:  Jefferey Pica Referring Phys: [place]  1. Left ventricular ejection fraction, by estimation, is 65 to 70%. The left ventricle has normal function. The left ventricle has no regional wall motion abnormalities. Left ventricular diastolic parameters were normal.  2. Right ventricular systolic function is normal. The right ventricular size is normal. There is moderately elevated pulmonary artery systolic pressure.  3. The mitral valve is degenerative. Mild mitral valve regurgitation. Moderate mitral annular calcification.  4. The aortic valve is tricuspid. There is mild calcification of  the aortic valve. There is mild thickening of the aortic valve. Aortic valve regurgitation is not visualized. Aortic valve sclerosis is present, with no evidence of aortic valve stenosis.  5. The inferior vena cava is normal in size with greater than 50% respiratory variability, suggesting right atrial pressure of 3 mmHg. Comparison(s): No significant change from prior study. FINDINGS  Left Ventricle: Left ventricular ejection fraction, by estimation, is 65 to 70%. The left ventricle has normal function. The left ventricle has no regional wall motion abnormalities. The left ventricular internal cavity size was normal in size. There is  no left ventricular hypertrophy. Left ventricular diastolic parameters were normal. Right Ventricle: The right ventricular size is normal. No increase in right ventricular wall thickness. Right ventricular systolic function is normal. There is moderately elevated pulmonary artery systolic pressure. The tricuspid regurgitant velocity is 3.62 m/s, and with an assumed right atrial pressure of 5 mmHg, the estimated right ventricular systolic pressure is 16.6 mmHg. Left Atrium: Left atrial size was normal in size. Right Atrium: Right atrial size was normal in size. Pericardium: There is no evidence of pericardial effusion. Mitral Valve: The mitral valve is degenerative in appearance. There is mild thickening of the mitral valve leaflet(s). There is mild calcification of the mitral valve leaflet(s). Moderate mitral annular calcification. Mild mitral valve regurgitation. Tricuspid Valve: The tricuspid valve is normal in structure. Tricuspid valve regurgitation is mild. Aortic Valve: The aortic valve is tricuspid. There is mild calcification of the aortic valve. There is mild thickening of the aortic valve. Aortic valve regurgitation is not visualized. Aortic valve sclerosis is present, with no evidence of aortic valve stenosis. Aortic valve peak gradient measures 12.1 mmHg. Pulmonic Valve:  The pulmonic valve was normal in structure. Pulmonic valve regurgitation is trivial. Aorta: The aortic root and ascending aorta are structurally normal, with no evidence of dilitation. Venous: The inferior vena cava is normal in size with greater than 50% respiratory variability, suggesting right atrial pressure of 3 mmHg. IAS/Shunts: The atrial septum is grossly normal.  LEFT VENTRICLE PLAX 2D LVIDd:         4.20 cm   Diastology LVIDs:         2.30 cm   LV e' medial:    7.88 cm/s LV PW:         1.10 cm   LV E/e' medial:  16.6 LV IVS:        1.00 cm   LV e' lateral:   9.08 cm/s LVOT diam:     1.80 cm   LV E/e' lateral: 14.4 LV SV:         88 LV SV Index:   43 LVOT Area:     2.54 cm  RIGHT VENTRICLE RV S prime:     19.20 cm/s TAPSE (M-mode): 2.6 cm LEFT ATRIUM             Index        RIGHT ATRIUM  Index LA diam:        4.50 cm 2.24 cm/m   RA Area:     17.30 cm LA Vol (A2C):   57.4 ml 28.52 ml/m  RA Volume:   45.00 ml  22.36 ml/m LA Vol (A4C):   54.0 ml 26.83 ml/m LA Biplane Vol: 57.5 ml 28.57 ml/m  AORTIC VALVE                  PULMONIC VALVE AV Area (Vmax): 2.33 cm      PV Vmax:       1.12 m/s AV Vmax:        174.00 cm/s   PV Peak grad:  5.0 mmHg AV Peak Grad:   12.1 mmHg LVOT Vmax:      159.00 cm/s LVOT Vmean:     108.000 cm/s LVOT VTI:       0.344 m  AORTA Ao Root diam: 3.10 cm Ao Asc diam:  3.40 cm MITRAL VALVE                TRICUSPID VALVE MV Area (PHT): 3.65 cm     TR Peak grad:   52.4 mmHg MV Decel Time: 208 msec     TR Vmax:        362.00 cm/s MV E velocity: 131.00 cm/s MV A velocity: 38.60 cm/s   SHUNTS MV E/A ratio:  3.39         Systemic VTI:  0.34 m                             Systemic Diam: 1.80 cm Gwyndolyn Kaufman MD Electronically signed by Gwyndolyn Kaufman MD Signature Date/Time: 09/04/2021/11:09:03 AM    Final    [place] EKG SITE RITE  Result Date: 09/03/2021 If Site Rite image not attached, placement could not be confirmed due to current cardiac rhythm.  DG Chest Portable 1  View  Result Date: 09/03/2021 CLINICAL DATA:  Provided history: Hematemesis. Additional history provided: Weakness, recent diagnosis of lymphoma. EXAM: PORTABLE CHEST 1 VIEW COMPARISON:  Head CT 08/27/2021. Prior chest radiographs 04/20/2021 and earlier. FINDINGS: Heart size at the upper limits of normal. Minimal atelectasis within the left lung base. No appreciable airspace consolidation or pulmonary edema. No evidence of pleural effusion or pneumothorax. No acute bony abnormality identified. Dextrocurvature of the thoracic spine. IMPRESSION: Minimal atelectasis within the left lung base. Otherwise, no evidence of acute cardiopulmonary abnormality. Electronically Signed   By: Kellie Simmering D.O.   On: 09/03/2021 12:20    Scheduled Meds:    allopurinol  150 mg Oral Daily   amiodarone  200 mg Oral Daily   apixaban  5 mg Oral BID   buPROPion  150 mg Oral Daily   Chlorhexidine Gluconate Cloth  6 each Topical Daily   loratadine  10 mg Oral Daily   magnesium oxide  200 mg Oral Daily   metoprolol succinate  25 mg Oral QHS   mirtazapine  30 mg Oral QHS   pantoprazole  40 mg Oral Daily   pravastatin  40 mg Oral QHS   sodium chloride flush  10-40 mL Intracatheter Q12H   sodium chloride flush  3 mL Intravenous Q12H    Continuous Infusions:    sodium chloride 75 mL/hr at 09/04/21 1333     LOS: 0 days     Vernell Leep, MD,  FACP, [facility], [facility], [facility] (Care Management Physician Certified) [facility]  [place]  To contact the attending provider between 7A-7P or the covering provider during after hours 7P-7A, please log into the web site [email or web address] and access using universal  password for that web site. If you do not have the password, please call the hospital operator.  09/04/2021, 4:31 PM

## 2021-09-04 NOTE — Progress Notes (Signed)
Carotid artery duplex has been completed. Preliminary results can be found in CV Proc through chart review.   09/04/21 9:51 AM [name] [name] RVT

## 2021-09-04 NOTE — Progress Notes (Addendum)
HEMATOLOGY-ONCOLOGY PROGRESS NOTE  ASSESSMENT AND PLAN: Large B-cell lymphoma presenting with macrocytic anemia/thrombocytopenia -08/13/2020 B12 3500 -08/13/2020 LDH 612 -08/13/2020 myeloma panel-no monoclonal protein  -Bone marrow biopsy 08/20/2020-hypercellular marrow with erythroid hyperplasia and dyspoiesis, rare lipogranuloma like lesions.  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  Negative myeloma FISH panel, 46XX; neotype myeloid disorders profile NGS sequencing-no pathogenic mutations detected in any of the genes on the NGS panel -PNH screen - 08/26/2020 -CTs 02/17/2021-new splenomegaly; diffuse bilateral bronchial wall thickening; clustered groundglass and fine nodular opacity in the dependent right lower lobe.  CAD. -Bone marrow biopsy 08/13/2021-hypercellular bone marrow for age with trilineage hematopoiesis; several atypical lymphoid aggregates present; no increase in blastic cells; flow cytometry without significant T or B-cell abnormalities; normal cytogenetics; "the limited morphologic and immunohistochemical features are atypical and a lymphoproliferative process is not excluded" -PET scan 6/50/3546-[id number] hypermetabolic lymphadenopathy in the neck, left axilla, mediastinum, retroperitoneum, and left pelvis.  Hypermetabolic skeletal lesions, generalized bilateral pulmonary hypermetabolism -08/30/2021-1 unit RBCs -Excisional lymph node biopsy left groin 08/30/2021-preliminary non-Hodgkin's lymphoma Fatigue Dyspnea on exertion Unsteady gait/balance disorder, followed by Dr. Tomi Likens CKD Atrial fibrillation Hypertension Sleep apnea Anorexia/weight loss 06/13/2021 skin rash, question vasculitis-course of prednisone initiated; 06/20/2021 marked improvement, rash recurred when she was tapered off of prednisone, prednisone resumed by dermatology Coagulopathy-most likely related to malnutrition and apixaban  Ms. Salberg has been readmitted to the hospital secondary to worsening  fatigue and weakness.  She had a syncopal episode while in the emergency department.  Further work-up in progress including an echocardiogram and carotid Doppler.  The patient is newly diagnosed with diffuse large B-cell lymphoma.  She was seen in our office earlier this week with plans to initiate systemic chemotherapy with R-CHOP next week in our office.  However, given that she is symptomatic from her underlying lymphoma, we will administer her first cycle of chemotherapy inpatient.  I have requested status of echocardiogram to be changed to stat.  We will plan to initiate R-CHOP tomorrow.  We will try to transfer the patient to the oncology unit.  She will continue on prednisone 40 mg p.o. daily.  Adverse effects of treatment were again discussed with the patient including but not limited to bone marrow toxicity, nausea, mouth sores, diarrhea, rash, hair loss as well as side effects of Rituxan including allergic reaction, reactivation of hepatitis B, PML.  She agrees to proceed.  She has a PICC line in place for her chemotherapy.  She has persistent pancytopenia due to her underlying malignancy.  We will continue to monitor and transfuse as needed.  Recommendations: 1.  Echocardiogram today. 2.  Plan for first cycle of R-CHOP in the hospital on 8/25.  Transferred to 6 floor if bed becomes available. 3.  Continue prednisone 40 mg p.o. daily. 4.  No need for transfusion at this time. 5.  Check uric acid, begin allopurinol [name] Ms. Clingan was interviewed and examined.  She was readmitted yesterday with failure to thrive.  She had a fall at home and a syncope event in the emergency room.  She appears well this morning.  The final pathology from the inguinal lymph node biopsy confirms a diffuse large B-cell lymphoma, CD20, and CD30 positive.  I reviewed the treatment plan for CHOP-rituximab with Ms. Pilant and her daughter.  Cycle 1 will be given as an inpatient.  I feel she needs to begin  treatment urgently.  She has an IPI score based on her age, performance status, LDH, and extranodal disease.  This places her in the high intermediate versus high risk category.  She understands the increased risk of toxicity with amiodarone therapy and pre-existing cytopenias.  She agrees to proceed.  A chemotherapy plan was entered today.    I was present for greater than 50% of today's visit.  I performed medical decision making.  ``````````````````````````````````````````````````````````````````````````````````````````````````````````````````````````````````````````````````````````````````````````````````````````````````````````````````````````````````````````````````````````````````````````````````````````````````````````````````````````````````````````````````````````````````````````````````````````````````````````````````````````````````````````````````````````````````````  SUBJECTIVE: The patient was readmitted to the hospital secondary to weakness.  While in the emergency department, she had a syncopal episode.  Remains very weak this morning.  No bleeding reported.  She is not having any fevers or chills.   PHYSICAL EXAMINATION:  Vitals:   09/04/21 0228 09/04/21 0548  BP: (!) 96/45 (!) 115/51  Pulse: 70 92  Resp: 17 20  Temp: 97.6 F (36.4 C) 97.8 F (36.6 C)  SpO2: 97% 100%   Filed Weights   09/03/21 1546 09/04/21 0500  Weight: 79.8 kg 89.7 kg    Intake/Output from previous day: 08/23 0701 - 08/24 0700 In: 237 [P.O.:237] Out: -   Physical Exam Vitals reviewed.  Constitutional:      General: She is not in acute distress. Eyes:     General: No scleral icterus.    Conjunctiva/sclera: Conjunctivae normal.  Cardiovascular:     Rate and Rhythm: Normal rate.  Pulmonary:     Effort: Pulmonary effort is normal. No respiratory distress.  Skin:    General: Skin is warm and dry.  Neurological:     Mental Status: She is alert and oriented to person, place, and time.    Left groin biopsy site without bleeding or evidence of infection LABORATORY DATA:  I have reviewed the data as listed    Latest Ref Rng & Units 09/04/2021    3:40 AM 09/03/2021   12:20 PM 09/02/2021    1:52 PM  CMP  Glucose 70 - 99 mg/dL 129  102  174   BUN 8 - 23 mg/dL _0 Creatinine 0.44 - 1.00 mg/dL 1.30  1.43  1.30   Sodium 135 - 145 mmol/L 140  140  136   Potassium 3.5 - 5.1 mmol/L 4.0  4.2  4.1   Chloride 98 - 111 mmol/L 114  113  110   CO2 22 - 32 mmol/L _1 Calcium 8.9 - 10.3 mg/dL 8.0  8.6  8.2   Total Protein 6.5 - 8.1 g/dL 4.3   4.8   Total Bilirubin 0.3 - 1.2 mg/dL 0.8   1.0   Alkaline Phos 38 - 126 U/L 73   72   AST 15 - 41 U/L 43   54   ALT 0 - 44 U/L 34   19     Lab Results  Component Value Date   WBC 1.9 (L) 09/04/2021   HGB 7.4 (L) 09/04/2021   HCT 24.2 (L) 09/04/2021   MCV 96.0 09/04/2021   PLT 64 (L) 09/04/2021   NEUTROABS 1.7 09/02/2021    No results found for: "CEA1", "CEA", "KRC381", "CA125", "PSA1"  [place] EKG SITE RITE  Result Date: 09/03/2021 If Site Rite image not attached, placement could not be confirmed due to current cardiac rhythm.  DG Chest Portable 1 View  Result Date: 09/03/2021 CLINICAL DATA:  Provided history: Hematemesis. Additional history provided: Weakness, recent diagnosis of lymphoma. EXAM: PORTABLE CHEST 1 VIEW COMPARISON:  Head CT 08/27/2021. Prior chest radiographs 04/20/2021 and earlier. FINDINGS: Heart size at the upper limits of normal. Minimal atelectasis within the left  lung base. No appreciable airspace consolidation or pulmonary edema. No evidence of pleural effusion or pneumothorax. No acute bony abnormality identified. Dextrocurvature of the thoracic spine. IMPRESSION: Minimal atelectasis within the left lung base. Otherwise, no evidence of acute cardiopulmonary abnormality. Electronically Signed   By: Kellie Simmering D.O.   On: 09/03/2021 12:20   NM PET Image Initial (PI) Skull Base To Thigh  Result Date:  08/29/2021 CLINICAL DATA:  Initial treatment strategy for progressive pancytopenia. Severe fatigue. Anorexia with weight loss. Night sweats. EXAM: NUCLEAR MEDICINE PET SKULL BASE TO THIGH TECHNIQUE: 9.1 mCi F-18 FDG was injected intravenously. Full-ring PET imaging was performed from the skull base to thigh after the radiotracer. CT data was obtained and used for attenuation correction and anatomic localization. Fasting blood glucose: 99 mg/dl COMPARISON:  02/17/2021 CT chest, abdomen and pelvis. FINDINGS: Mediastinal blood pool activity: SUV max 2.7 Liver activity: SUV max 4.3 NECK: Several small hypermetabolic bilateral neck lymph nodes. Representative left level 2 neck 0.5 cm node with max SUV 10.5 (series 4/image 34). Representative 0.6 cm right level 3 neck node with max SUV 13.8 (series 4/image 38). Incidental CT findings: None. CHEST: Hypermetabolic left axillary lymphadenopathy with dominant 1.0 cm left axillary node with max SUV 23.6 (series 4/image 59). No enlarged or hypermetabolic right axillary nodes. Several mildly enlarged hypermetabolic bilateral mediastinal lymph nodes. Representative 1.1 cm right paratracheal node with max SUV 14.2 (series 4/image 65). Representative 1.0 cm subcarinal node with max SUV 29.5 (series 4/image 75). Hypermetabolic left hilar adenopathy with max SUV 7.4. No hypermetabolic right hilar adenopathy. Generalized hypermetabolism throughout the bilateral lung fields with associated faint ground-glass opacity in both lungs with representative max SUV 5.1 in the left lower lobe. Incidental CT findings: Coronary atherosclerosis. Atherosclerotic nonaneurysmal thoracic aorta. No significant pulmonary nodules. ABDOMEN/PELVIS: Widespread hypermetabolic lymphadenopathy in the porta hepatis, portacaval, aortocaval, left para-aortic, mesenteric, left common iliac, left external iliac and left inguinal nodal chains. Representative 1.3 cm porta hepatis node with max SUV 13.0 (series  5/image 115). Representative 1.3 cm aortocaval node with max SUV 25.7 (series 4/image 141). Representative 1.5 cm left para-aortic node with max SUV 26.7 (series 4/image 138). Representative 1.0 cm right mesenteric node with max SUV 21.2 (series 4/image 145). Representative 1.3 cm left external iliac node with max SUV 15.8 (series 4/image 172). Representative 1.0 cm left inguinal node with max SUV 15.6 (series 4/image 187). Mild-to-moderate splenomegaly. Craniocaudal splenic length 16.3 cm. Splenic FDG uptake is equivalent to the liver with max SUV 4.4. No focal splenic masses. No abnormal hypermetabolic activity within the liver, pancreas or adrenal glands. Incidental CT findings: Postsurgical changes from fundoplication in the proximal stomach. Atherosclerotic nonaneurysmal abdominal aorta. Hysterectomy. SKELETON: Multiple hypermetabolic skeletal lesions in the axial and visualized proximal appendicular skeleton. Representative right T11 vertebral/posterior element lesion with max SUV 15.4. Representative proximal left humerus metaphysis lesion with max SUV 5.0. Representative left mandibular body lesion with max SUV 6.7. Incidental CT findings: None. IMPRESSION: 1. Widespread intensely hypermetabolic lymphadenopathy in the neck, left axilla, mediastinum, retroperitoneum and left pelvis. Mild-to-moderate splenomegaly. Scattered hypermetabolic skeletal lesions. Generalized bilateral pulmonary hypermetabolism with associated faint ground-glass opacity. Findings are most compatible with lymphoproliferative disorder/lymphoma. Deauville score 5. 2. Chronic findings include: Aortic Atherosclerosis (ICD10-I70.0). Coronary atherosclerosis. Electronically Signed   By: Ilona Sorrel M.D.   On: 08/29/2021 10:09   IR BONE MARROW BIOPSY & ASPIRATION  Result Date: 08/13/2021 INDICATION: pancytopenia, sweats, splenomegaly, anorexia EXAM: Bone marrow aspiration and core biopsy using fluoroscopic guidance MEDICATIONS: None.  ANESTHESIA/SEDATION: Moderate (conscious) sedation was employed during this procedure. A total of Versed 2 mg and Fentanyl 100 mcg was administered intravenously. Moderate Sedation Time: 10 minutes. The patient's level of consciousness and vital signs were monitored continuously by radiology nursing throughout the procedure under my direct supervision. FLUOROSCOPY TIME:  Fluoroscopy Time: 1.8 minutes (18 mGy) COMPLICATIONS: None immediate. PROCEDURE: Informed written consent was obtained from the patient after a thorough discussion of the procedural risks, benefits and alternatives. All questions were addressed. Maximal Sterile Barrier Technique was utilized including caps, mask, sterile gowns, sterile gloves, sterile drape, hand hygiene and skin antiseptic. A timeout was performed prior to the initiation of the procedure. The patient was placed prone on the IR exam table. Limited fluoroscopy of the pelvis was performed for planning purposes. Skin entry site was marked, and the overlying skin was prepped and draped in the standard sterile fashion. Local analgesia was obtained with 1% lidocaine. Using fluoroscopic guidance, an 11 gauge needle was advanced just deep to the cortex of the right posterior ilium. Subsequently, bone marrow aspiration and core biopsy were performed. Specimens were submitted to lab/pathology for handling. Hemostasis was achieved with manual pressure, and a clean dressing was placed. The patient tolerated the procedure well without immediate complication. IMPRESSION: Successful bone marrow aspiration and core biopsy of the right posterior ilium using fluoroscopic guidance. Electronically Signed   By: Albin Felling M.D.   On: 08/13/2021 14:00     Future Appointments  Date Time Provider [place]  09/04/2021 11:00 AM WL- ECHO 1-[place]  09/04/2021  2:00 PM WL- ECHO 1-RUTH [facility]  09/04/2021  4:00 PM WL VASC US 1-[place] [facility]  09/22/2021 12:00 PM [facility]  [facility]  10/09/2021  9:00 AM Jerline Pain, MD [facility]      LOS: 0 days

## 2021-09-05 DIAGNOSIS — C8598 Non-Hodgkin lymphoma, unspecified, lymph nodes of multiple sites: Secondary | ICD-10-CM | POA: Diagnosis not present

## 2021-09-05 DIAGNOSIS — D61818 Other pancytopenia: Secondary | ICD-10-CM | POA: Diagnosis not present

## 2021-09-05 DIAGNOSIS — R55 Syncope and collapse: Secondary | ICD-10-CM | POA: Diagnosis not present

## 2021-09-05 LAB — CBC WITH DIFFERENTIAL/PLATELET
Abs Immature Granulocytes: 0.02 10*3/uL (ref 0.00–0.07)
Basophils Absolute: 0 10*3/uL (ref 0.0–0.1)
Basophils Relative: 0 %
Eosinophils Absolute: 0 10*3/uL (ref 0.0–0.5)
Eosinophils Relative: 0 %
HCT: 23.2 % — ABNORMAL LOW (ref 36.0–46.0)
Hemoglobin: 7.1 g/dL — ABNORMAL LOW (ref 12.0–15.0)
Immature Granulocytes: 1 %
Lymphocytes Relative: 16 %
Lymphs Abs: 0.3 10*3/uL — ABNORMAL LOW (ref 0.7–4.0)
MCH: 29.3 pg (ref 26.0–34.0)
MCHC: 30.6 g/dL (ref 30.0–36.0)
MCV: 95.9 fL (ref 80.0–100.0)
Monocytes Absolute: 0.3 10*3/uL (ref 0.1–1.0)
Monocytes Relative: 18 %
Neutro Abs: 1 10*3/uL — ABNORMAL LOW (ref 1.7–7.7)
Neutrophils Relative %: 65 %
Platelets: 61 10*3/uL — ABNORMAL LOW (ref 150–400)
RBC: 2.42 MIL/uL — ABNORMAL LOW (ref 3.87–5.11)
RDW: 18.4 % — ABNORMAL HIGH (ref 11.5–15.5)
WBC: 1.5 10*3/uL — ABNORMAL LOW (ref 4.0–10.5)
nRBC: 0 % (ref 0.0–0.2)

## 2021-09-05 LAB — BASIC METABOLIC PANEL
Anion gap: 5 (ref 5–15)
BUN: 20 mg/dL (ref 8–23)
CO2: 21 mmol/L — ABNORMAL LOW (ref 22–32)
Calcium: 7.8 mg/dL — ABNORMAL LOW (ref 8.9–10.3)
Chloride: 114 mmol/L — ABNORMAL HIGH (ref 98–111)
Creatinine, Ser: 1.06 mg/dL — ABNORMAL HIGH (ref 0.44–1.00)
GFR, Estimated: 55 mL/min — ABNORMAL LOW (ref >60)
Glucose, Bld: 108 mg/dL — ABNORMAL HIGH (ref 70–99)
Potassium: 3.8 mmol/L (ref 3.5–5.1)
Sodium: 140 mmol/L (ref 135–145)

## 2021-09-05 LAB — SURGICAL PATHOLOGY

## 2021-09-05 LAB — HEPATITIS B CORE ANTIBODY, TOTAL: Hep B Core Total Ab: NONREACTIVE

## 2021-09-05 LAB — HEPATITIS B SURFACE ANTIGEN: Hepatitis B Surface Ag: NONREACTIVE

## 2021-09-05 MED ORDER — PREDNISONE 20 MG PO TABS
60.0000 mg | ORAL_TABLET | Freq: Every day | ORAL | Status: AC
  Administered 2021-09-06 – 2021-09-09 (×4): 60 mg via ORAL
  Filled 2021-09-05 (×4): qty 3

## 2021-09-05 MED ORDER — FAMOTIDINE IN NACL 20-0.9 MG/50ML-% IV SOLN
20.0000 mg | Freq: Once | INTRAVENOUS | Status: AC | PRN
  Administered 2021-09-05: 20 mg via INTRAVENOUS

## 2021-09-05 MED ORDER — VINCRISTINE SULFATE CHEMO INJECTION 1 MG/ML
1.0000 mg | Freq: Once | INTRAVENOUS | Status: AC
  Administered 2021-09-05: 1 mg via INTRAVENOUS
  Filled 2021-09-05: qty 1

## 2021-09-05 MED ORDER — TBO-FILGRASTIM 480 MCG/0.8ML ~~LOC~~ SOSY
480.0000 ug | PREFILLED_SYRINGE | Freq: Every day | SUBCUTANEOUS | Status: AC
  Administered 2021-09-06 – 2021-09-10 (×5): 480 ug via SUBCUTANEOUS
  Filled 2021-09-05 (×5): qty 0.8

## 2021-09-05 MED ORDER — SODIUM CHLORIDE 0.9 % IV SOLN
490.0000 mg/m2 | Freq: Once | INTRAVENOUS | Status: AC
  Administered 2021-09-05: 1000 mg via INTRAVENOUS
  Filled 2021-09-05: qty 50

## 2021-09-05 MED ORDER — SODIUM CHLORIDE 0.9 % IV SOLN
10.0000 mg | Freq: Once | INTRAVENOUS | Status: AC
  Administered 2021-09-05: 10 mg via INTRAVENOUS
  Filled 2021-09-05: qty 1

## 2021-09-05 MED ORDER — PALONOSETRON HCL INJECTION 0.25 MG/5ML
0.2500 mg | Freq: Once | INTRAVENOUS | Status: AC
  Administered 2021-09-05: 0.25 mg via INTRAVENOUS
  Filled 2021-09-05: qty 5

## 2021-09-05 MED ORDER — DOXORUBICIN HCL CHEMO IV INJECTION 2 MG/ML
25.0000 mg/m2 | Freq: Once | INTRAVENOUS | Status: AC
  Administered 2021-09-05: 52 mg via INTRAVENOUS
  Filled 2021-09-05: qty 26

## 2021-09-05 MED ORDER — ACETAMINOPHEN 325 MG PO TABS
650.0000 mg | ORAL_TABLET | Freq: Once | ORAL | Status: AC
  Administered 2021-09-05: 650 mg via ORAL
  Filled 2021-09-05: qty 2

## 2021-09-05 MED ORDER — DIPHENHYDRAMINE HCL 25 MG PO CAPS
50.0000 mg | ORAL_CAPSULE | Freq: Once | ORAL | Status: AC
  Administered 2021-09-05: 50 mg via ORAL
  Filled 2021-09-05: qty 2

## 2021-09-05 MED ORDER — HOT PACK MISC ONCOLOGY: 1.0000 | Freq: Once | Status: AC | PRN

## 2021-09-05 MED ORDER — SODIUM CHLORIDE 0.9 % IV SOLN
375.0000 mg/m2 | Freq: Once | INTRAVENOUS | Status: AC
  Administered 2021-09-05: 800 mg via INTRAVENOUS
  Filled 2021-09-05: qty 80

## 2021-09-05 MED ORDER — SODIUM CHLORIDE 0.9 % IV SOLN
150.0000 mg | Freq: Once | INTRAVENOUS | Status: AC
  Administered 2021-09-05: 150 mg via INTRAVENOUS
  Filled 2021-09-05: qty 5

## 2021-09-05 MED ORDER — COLD PACK MISC ONCOLOGY: 1.0000 | Freq: Once | Status: AC | PRN

## 2021-09-05 MED ORDER — SODIUM CHLORIDE 0.9 % IV SOLN: Freq: Once | INTRAVENOUS | Status: AC

## 2021-09-05 NOTE — Progress Notes (Signed)
Per Dr. Benay Spice we will start R-CHOP today. ok to treat with her Hgb 7.1, Plt count 61 and Abs Neutrophil count of 1.0.

## 2021-09-05 NOTE — Progress Notes (Signed)
PROGRESS NOTE   [name] [name]  KPV:[id number]    DOB: 1945/10/25    DOA: 09/03/2021  PCP: Harlan Stains, MD   I have briefly reviewed patients previous medical records in [facility].  Chief Complaint  Patient presents with   Weakness    Brief Narrative:  76 year old female with PMH of persistent A-fib, stage III CKD, anxiety and depression, GERD, HLD, HTN, prediabetes, OSA on no CPAP, hypothyroidism, recent hospital admission 08/28/2021 - 09/01/2021 for generalized weakness, poor appetite, nightly chills over the last year and a half, and a 40 pound weight loss, underwent lymph node biopsy, newly diagnosed with diffuse large B cell lymphoma, seen in oncology office on 8/22 with plans to initiate systemic chemotherapy with R-CHOP next week, readmitted to the hospital on 09/03/2021 due to worsening fatigue, weakness and a syncopal episode while in the ED.  Oncology consulted and started first cycle of R-CHOP in the hospital on 8/25.   Assessment & Plan:  Principal Problem:   Syncope Active Problems:   Generalized anxiety disorder   PAF (paroxysmal atrial fibrillation) (HCC)   Hyperlipidemia   Essential hypertension   Gastroesophageal reflux disease without esophagitis   (HFpEF) heart failure with preserved ejection fraction (HCC)   OSA (obstructive sleep apnea)   Hypothyroidism   Pancytopenia (HCC)   NHL (non-Hodgkin's lymphoma) (HCC)   Prediabetes   Large B cell lymphoma: History as noted above.  New diagnosis.  Oncology input appreciated and started first cycle of R-CHOP in the hospital on 8/25.  Continuing steroids (dexamethasone today followed by prednisone 60 daily x4 days) and started allopurinol.  Final pathology from inguinal lymph node biopsy confirms diffuse large B-cell lymphoma.  Further management per oncology.  Follow daily CBCs.  Pancytopenia: Secondary to lymphoma.  Follow CBCs closely.  Transfuse PRBC if hemoglobin 7 g or less, platelets if counts less than 20 K  or bleeding and G-CSF if neutropenic <1 K.  Starting Granix 8/26.  Likely will need PRBC transfusion soon, hemoglobin down to 7.1.    Hemoptysis: Suspect low volume, "spit up some blood".  Unclear etiology.  Is on apixaban and also has thrombocytopenia as above.  No further episodes.  As discussed with Dr. Benay Spice, expect thrombocytopenia to worsen after chemo and risks of bleeding outweigh benefits of apixaban anticoagulation for stroke prophylaxis, hence discontinued Eliquis for now.  No further episodes.  As discussed with spouse at bedside, appears to be minimal.  Syncope: Reportedly occurred in ED triage on 8/23.  Bolused with 2 L of IV saline.  HS Troponin x2 negative at 11 > 9.  TTE: LVEF 65-70%, no aortic stenosis.  No significant change from prior study.  CUS: Bilateral ICA consistent with 1-39% stenosis.  Check orthostatics.  If patient drives, should be advised not to drive for 6 months.  Telemetry shows sinus rhythm without arrhythmias or pauses  Anxiety disorder: Continue home dose of Remeron, bupropion and alprazolam.  Persistent atrial fibrillation: CHA2DS2-VASc score: 6.  Continue amiodarone, Toprol-XL.  Eliquis discontinued for now.  Currently in sinus rhythm.  Hyperlipidemia: Continue pravastatin  Essential hypertension: Controlled on metoprolol succinate, continue  GERD without esophagitis: Continue pantoprazole  Chronic diastolic CHF: Clinically euvolemic.  OSA, not on CPAP Noted  Hypothyroidism TSH 0.149 in the context of acute illness.  Clinically euthyroid.  Check free T3 and free T4, will likely resume prior home dose of Synthroid.  Prediabetes A1c 5.8 in 2018.  Good glycemic control since hospital admission.  DC'd CBGs.  Outpatient follow-up.  Acute kidney injury complicating chronic kidney disease stage IIIb Baseline creatinine probably in the 1.4-1.6 range, current creatinine is better.  Although creatinine has dropped to 1.06, still feel that she is  CKD stage IIIb.  Elevated lactate/lactic acidosis: Resolved.  Body mass index is 30.97 kg/m.     DVT prophylaxis: SCDs Start: 09/03/21 1459.     Code Status: DNR:  Family Communication: Spouse at bedside Disposition:  Status is: Inpatient Remains inpatient appropriate because: Pancytopenia, due to NHL, plan to start chemotherapy 8/25.  Likely will remain in the hospital 4 to 5 days.     Consultants:   Medical oncology  Procedures:     Antimicrobials:      Subjective:  Generalized weakness.  No dizziness, lightheadedness, chest pain, dyspnea or palpitations.  No further spitting up of blood.  Objective:   Vitals:   09/05/21 1354 09/05/21 1426 09/05/21 1453 09/05/21 1521  BP: (!) 105/43 (!) 99/44 (!) 98/48 (!) 102/52  Pulse: 72 66 61 62  Resp: '18 18 18 18  '$ Temp: 98.2 F (36.8 C) 98.6 F (37 C) 98.2 F (36.8 C) 98.6 F (37 C)  TempSrc: Oral Oral Oral Oral  SpO2: 99% 99% 99% 99%  Weight:      Height:        General exam: Elderly female, small built, frail and chronically ill looking sitting up comfortably in bed without distress.   Respiratory system: Clear to auscultation.  No increased work of breathing. Cardiovascular system: S1 & S2 heard, RRR. No JVD, murmurs, rubs, gallops or clicks. No pedal edema.  Telemetry personally reviewed: Sinus rhythm. Gastrointestinal system: Abdomen is nondistended, soft and nontender. No organomegaly or masses felt. Normal bowel sounds heard. Central nervous system: Alert and oriented. No focal neurological deficits. Extremities: Symmetric 5 x 5 power. Skin: No rashes, lesions or ulcers Psychiatry: Judgement and insight appear normal. Mood & affect seems somewhat down this morning.   Data Reviewed:   I have personally reviewed following labs and imaging studies   CBC: Recent Labs  Lab 09/02/21 1352 09/03/21 1220 09/04/21 0340 09/05/21 0410  WBC 2.4* 2.1* 1.9* 1.5*  NEUTROABS 1.7  --  1.3* 1.0*  HGB 8.7* 9.6* 7.4*  7.1*  HCT 28.1* 32.2* 24.2* 23.2*  MCV 93.7 99.1 96.0 95.9  PLT 74* 75* 64* 61*    Basic Metabolic Panel: Recent Labs  Lab 08/30/21 0543 08/31/21 0541 09/01/21 0519 09/02/21 1352 09/03/21 1220 09/03/21 1224 09/04/21 0340 09/05/21 0410  NA 139  --  141 136 140  --  140 140  K 4.0  --  3.8 4.1 4.2  --  4.0 3.8  CL 113*  --  120* 110 113*  --  114* 114*  CO2 22  --  18* 16* 20*  --  22 21*  GLUCOSE 93  --  164* 174* 102*  --  129* 108*  BUN 35*  --  27* 27* 25*  --  26* 20  CREATININE 2.07*  --  1.50* 1.30* 1.43*  --  1.30* 1.06*  CALCIUM 7.8*  --  8.2* 8.2* 8.6*  --  8.0* 7.8*  MG 1.9 2.0 2.0  --   --  1.8  --   --   PHOS 3.3 3.1 3.0  --   --   --   --   --     Liver Function Tests: Recent Labs  Lab 08/30/21 0543 09/01/21 0519 09/02/21 1352 09/04/21 0340  AST 19 21  54* 43*  ALT '9 12 19 '$ 34  ALKPHOS 39 45 72 73  BILITOT 0.7 0.6 1.0 0.8  PROT 4.2* 4.2* 4.8* 4.3*  ALBUMIN 2.0* 2.0* 2.5* 1.9*    CBG: Recent Labs  Lab 09/03/21 1158 09/04/21 0745 09/04/21 1158  GLUCAP 102* 87 119*    Microbiology Studies:   Recent Results (from the past 240 hour(s))  Culture, blood (Routine X 2) w Reflex to ID Panel     Status: None   Collection Time: 08/29/21  9:11 AM   Specimen: BLOOD  Result Value Ref Range Status   Specimen Description   Final    BLOOD LEFT ANTECUBITAL Performed at [facility], [place] [address], [place], [place] [address]    Special Requests   Final    BOTTLES DRAWN AEROBIC AND ANAEROBIC Blood Culture adequate volume Performed at [place] [address], [place], [place] [address]    Culture   Final    NO GROWTH 5 DAYS Performed at [facility], [place] [address] [place], [place] [address]    Report Status 09/03/2021 FINAL  Final  Culture, blood (Routine X 2) w Reflex to ID Panel     Status: None   Collection Time: 08/29/21  9:27 AM   Specimen: BLOOD  Result Value Ref Range Status   Specimen  Description   Final    BLOOD RIGHT ANTECUBITAL Performed at [facility] [address] [place], [place] [address]    Special Requests   Final    BOTTLES DRAWN AEROBIC AND ANAEROBIC Blood Culture adequate volume Performed at [facility] [address], [place], [place] [address]    Culture   Final    NO GROWTH 5 DAYS Performed at [facility], [place] [address], [place],  [address]    Report Status 09/03/2021 FINAL  Final  SARS Coronavirus 2 by RT PCR (hospital order, performed in [facility] hospital lab) *cepheid single result test* Anterior Nasal Swab     Status: None   Collection Time: 09/03/21  2:22 PM   Specimen: Anterior Nasal Swab  Result Value Ref Range Status   SARS Coronavirus 2 by RT PCR NEGATIVE NEGATIVE Final    Comment: (NOTE) SARS-CoV-2 target nucleic acids are NOT DETECTED.  The SARS-CoV-2 RNA is generally detectable in upper and lower respiratory specimens during the acute phase of infection. The lowest concentration of SARS-CoV-2 viral copies this assay can detect is 250 copies / mL. A negative result does not preclude SARS-CoV-2 infection and should not be used as the sole basis for treatment or other patient management decisions.  A negative result may occur with improper specimen collection / handling, submission of specimen other than nasopharyngeal swab, presence of viral mutation(s) within the areas targeted by this assay, and inadequate number of viral copies (<250 copies / mL). A negative result must be combined with clinical observations, patient history, and epidemiological information.  Fact Sheet for Patients:   https://www.patel.info/  Fact Sheet for Healthcare Providers: https://hall.com/  This test is not yet approved or  cleared by the [place] FDA and has been authorized for detection and/or diagnosis of SARS-CoV-2 by FDA under an Emergency  Use Authorization (EUA).  This EUA will remain in effect (meaning this test can be used) for the duration of the COVID-19 declaration under Section 564(b)(1) of the Act, 21 U.S.C. section 360bbb-3(b)(1), unless the authorization is terminated or revoked sooner.  Performed at  [facility], [place] [address] [place], [place] [address]     Radiology Studies:  VAS US CAROTID  Result Date: 09/04/2021 Carotid Arterial Duplex Study Patient Name:  [name] [name]  Date of Exam:   09/04/2021 Medical Rec #: [id number]      Accession #:    [id number] Date of Birth: [birth year]       Patient Gender: F Patient Age:   32 years Exam Location:  [facility] Procedure:      VAS US CAROTID Referring Phys: DAVID ORTIZ --------------------------------------------------------------------------------  Indications:       Syncope. Risk Factors:      Hypertension, hyperlipidemia. Limitations        Today's exam was limited due to the patient's respiratory                    variation. Comparison Study:  No prior studies. Performing Technologist: Oliver Hum RVT  Examination Guidelines: A complete evaluation includes B-mode imaging, spectral Doppler, color Doppler, and power Doppler as needed of all accessible portions of each vessel. Bilateral testing is considered an integral part of a complete examination. Limited examinations for reoccurring indications may be performed as noted.  Right Carotid Findings: +----------+--------+--------+--------+-----------------------+--------+           PSV cm/sEDV cm/sStenosisPlaque Description     Comments +----------+--------+--------+--------+-----------------------+--------+ CCA Prox  92      12                                     tortuous +----------+--------+--------+--------+-----------------------+--------+ CCA Distal73      14              smooth and heterogenous          +----------+--------+--------+--------+-----------------------+--------+ ICA Prox  74      16              smooth and heterogenous         +----------+--------+--------+--------+-----------------------+--------+ ICA Distal65      14                                     tortuous +----------+--------+--------+--------+-----------------------+--------+ ECA       89      4                                               +----------+--------+--------+--------+-----------------------+--------+ +----------+--------+-------+--------+-------------------+           PSV cm/sEDV cmsDescribeArm Pressure (mmHG) +----------+--------+-------+--------+-------------------+ [id number]                                        +----------+--------+-------+--------+-------------------+ +---------+--------+--+--------+-+---------+ VertebralPSV cm/s57EDV cm/s8Antegrade +---------+--------+--+--------+-+---------+  Left Carotid Findings: +----------+-------+-------+--------+---------------------------------+--------+           PSV    EDV    StenosisPlaque Description               Comments           cm/s   cm/s                                                     +----------+-------+-------+--------+---------------------------------+--------+  CCA Prox  95     14                                                       +----------+-------+-------+--------+---------------------------------+--------+ CCA Distal77     14             smooth and heterogenous                   +----------+-------+-------+--------+---------------------------------+--------+ ICA Prox  86     19             calcific, irregular and          tortuous                                 heterogenous                              +----------+-------+-------+--------+---------------------------------+--------+ ICA Distal78     15                                              tortuous  +----------+-------+-------+--------+---------------------------------+--------+ ECA       151    14                                                       +----------+-------+-------+--------+---------------------------------+--------+ +----------+--------+--------+--------+-------------------+           PSV cm/sEDV cm/sDescribeArm Pressure (mmHG) +----------+--------+--------+--------+-------------------+ [id number]                                         +----------+--------+--------+--------+-------------------+ +---------+--------+--+--------+--+---------+ VertebralPSV cm/s54EDV cm/s12Antegrade +---------+--------+--+--------+--+---------+   Summary: Right Carotid: Velocities in the right ICA are consistent with a 1-39% stenosis. Left Carotid: Velocities in the left ICA are consistent with a 1-39% stenosis. Vertebrals: Bilateral vertebral arteries demonstrate antegrade flow. *See table(s) above for measurements and observations.  Electronically signed by Monica Martinez MD on 09/04/2021 at 1:02:46 PM.    Final    ECHOCARDIOGRAM COMPLETE  Result Date: 09/04/2021    ECHOCARDIOGRAM REPORT   Patient Name:   [name] [name] Date of Exam: 09/04/2021 Medical Rec #:  [id number]     Height:       67.0 in Accession #:    [id number]    Weight:       197.8 lb Date of Birth:  Jan 31, 1945      BSA:          2.013 m Patient Age:    79 years      BP:           115/51 mmHg Patient Gender: F             HR:           83 bpm. Exam Location:  Inpatient Procedure: 2D Echo, Cardiac Doppler and Color Doppler Indications:  Chemotherapy  History:        Patient has prior history of Echocardiogram examinations, most                 recent 08/09/2020. Risk Factors:Sleep Apnea, Diabetes and                 Hypertension.  Sonographer:    Jefferey Pica Referring Phys: [place]  1. Left ventricular ejection fraction, by estimation, is 65 to 70%. The left ventricle has normal function.  The left ventricle has no regional wall motion abnormalities. Left ventricular diastolic parameters were normal.  2. Right ventricular systolic function is normal. The right ventricular size is normal. There is moderately elevated pulmonary artery systolic pressure.  3. The mitral valve is degenerative. Mild mitral valve regurgitation. Moderate mitral annular calcification.  4. The aortic valve is tricuspid. There is mild calcification of the aortic valve. There is mild thickening of the aortic valve. Aortic valve regurgitation is not visualized. Aortic valve sclerosis is present, with no evidence of aortic valve stenosis.  5. The inferior vena cava is normal in size with greater than 50% respiratory variability, suggesting right atrial pressure of 3 mmHg. Comparison(s): No significant change from prior study. FINDINGS  Left Ventricle: Left ventricular ejection fraction, by estimation, is 65 to 70%. The left ventricle has normal function. The left ventricle has no regional wall motion abnormalities. The left ventricular internal cavity size was normal in size. There is  no left ventricular hypertrophy. Left ventricular diastolic parameters were normal. Right Ventricle: The right ventricular size is normal. No increase in right ventricular wall thickness. Right ventricular systolic function is normal. There is moderately elevated pulmonary artery systolic pressure. The tricuspid regurgitant velocity is 3.62 m/s, and with an assumed right atrial pressure of 5 mmHg, the estimated right ventricular systolic pressure is 15.4 mmHg. Left Atrium: Left atrial size was normal in size. Right Atrium: Right atrial size was normal in size. Pericardium: There is no evidence of pericardial effusion. Mitral Valve: The mitral valve is degenerative in appearance. There is mild thickening of the mitral valve leaflet(s). There is mild calcification of the mitral valve leaflet(s). Moderate mitral annular calcification. Mild mitral valve  regurgitation. Tricuspid Valve: The tricuspid valve is normal in structure. Tricuspid valve regurgitation is mild. Aortic Valve: The aortic valve is tricuspid. There is mild calcification of the aortic valve. There is mild thickening of the aortic valve. Aortic valve regurgitation is not visualized. Aortic valve sclerosis is present, with no evidence of aortic valve stenosis. Aortic valve peak gradient measures 12.1 mmHg. Pulmonic Valve: The pulmonic valve was normal in structure. Pulmonic valve regurgitation is trivial. Aorta: The aortic root and ascending aorta are structurally normal, with no evidence of dilitation. Venous: The inferior vena cava is normal in size with greater than 50% respiratory variability, suggesting right atrial pressure of 3 mmHg. IAS/Shunts: The atrial septum is grossly normal.  LEFT VENTRICLE PLAX 2D LVIDd:         4.20 cm   Diastology LVIDs:         2.30 cm   LV e' medial:    7.88 cm/s LV PW:         1.10 cm   LV E/e' medial:  16.6 LV IVS:        1.00 cm   LV e' lateral:   9.08 cm/s LVOT diam:     1.80 cm   LV E/e' lateral: 14.4 LV SV:  88 LV SV Index:   43 LVOT Area:     2.54 cm  RIGHT VENTRICLE RV S prime:     19.20 cm/s TAPSE (M-mode): 2.6 cm LEFT ATRIUM             Index        RIGHT ATRIUM           Index LA diam:        4.50 cm 2.24 cm/m   RA Area:     17.30 cm LA Vol (A2C):   57.4 ml 28.52 ml/m  RA Volume:   45.00 ml  22.36 ml/m LA Vol (A4C):   54.0 ml 26.83 ml/m LA Biplane Vol: 57.5 ml 28.57 ml/m  AORTIC VALVE                  PULMONIC VALVE AV Area (Vmax): 2.33 cm      PV Vmax:       1.12 m/s AV Vmax:        174.00 cm/s   PV Peak grad:  5.0 mmHg AV Peak Grad:   12.1 mmHg LVOT Vmax:      159.00 cm/s LVOT Vmean:     108.000 cm/s LVOT VTI:       0.344 m  AORTA Ao Root diam: 3.10 cm Ao Asc diam:  3.40 cm MITRAL VALVE                TRICUSPID VALVE MV Area (PHT): 3.65 cm     TR Peak grad:   52.4 mmHg MV Decel Time: 208 msec     TR Vmax:        362.00 cm/s MV E  velocity: 131.00 cm/s MV A velocity: 38.60 cm/s   SHUNTS MV E/A ratio:  3.39         Systemic VTI:  0.34 m                             Systemic Diam: 1.80 cm Gwyndolyn Kaufman MD Electronically signed by Gwyndolyn Kaufman MD Signature Date/Time: 09/04/2021/11:09:03 AM    Final     Scheduled Meds:    allopurinol  150 mg Oral Daily   amiodarone  200 mg Oral Daily   buPROPion  150 mg Oral Daily   Chlorhexidine Gluconate Cloth  6 each Topical Daily   loratadine  10 mg Oral Daily   magnesium oxide  200 mg Oral Daily   metoprolol succinate  25 mg Oral QHS   mirtazapine  30 mg Oral QHS   pantoprazole  40 mg Oral Daily   pravastatin  40 mg Oral QHS   [START ON 09/06/2021] predniSONE  60 mg Oral Q breakfast   sodium chloride flush  10-40 mL Intracatheter Q12H   sodium chloride flush  3 mL Intravenous Q12H   [START ON 09/06/2021] Tbo-filgastrim (GRANIX) SQ  480 mcg Subcutaneous q1800    Continuous Infusions:    sodium chloride Stopped (09/05/21 0810)     LOS: 1 day     Vernell Leep, MD,  FACP, [facility], [facility], [facility] (Care Management Physician Certified) [place]  To contact the attending provider between 7A-7P or the covering provider during after hours 7P-7A, please log into the web site [email or web address] and access using universal [place] password for that web site. If you do not have the password, please call the hospital operator.  09/05/2021, 3:52 PM

## 2021-09-05 NOTE — Progress Notes (Addendum)
  Patient started Rituxan treatment today. At 11:48 patient reported feeling hot and like a elephant was sitting on her chest. Infusion was paused and pepcid '20mg'$  iv was given per Dr. Benay Spice. Fluids given to gravity. VS: 108/54, pulse 65, o2 95%, resp 17, temp 98.8. Per Dr. Benay Spice treatment may resume after patient feels better and VSS are still stable. At 12:15 infusion was started back. Patient tolerated the rest of her treatment with no issues. VSS through out the rest of her treatment. Education on medications and after care were given to patient and family.

## 2021-09-05 NOTE — Progress Notes (Addendum)
HEMATOLOGY-ONCOLOGY PROGRESS NOTE  ASSESSMENT AND PLAN: Large B-cell lymphoma presenting with macrocytic anemia/thrombocytopenia -08/13/2020 B12 3500 -08/13/2020 LDH 612 -08/13/2020 myeloma panel-no monoclonal protein  -Bone marrow biopsy 08/20/2020-hypercellular marrow with erythroid hyperplasia and dyspoiesis, rare lipogranuloma like lesions.  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  Negative myeloma FISH panel, 46XX; neotype myeloid disorders profile NGS sequencing-no pathogenic mutations detected in any of the genes on the NGS panel -[facility] screen - 08/26/2020 -CTs 02/17/2021-new splenomegaly; diffuse bilateral bronchial wall thickening; clustered groundglass and fine nodular opacity in the dependent right lower lobe.  CAD. -Bone marrow biopsy 08/13/2021-hypercellular bone marrow for age with trilineage hematopoiesis; several atypical lymphoid aggregates present; no increase in blastic cells; flow cytometry without significant T or B-cell abnormalities; normal cytogenetics; "the limited morphologic and immunohistochemical features are atypical and a lymphoproliferative process is not excluded" -PET scan 5/52/0802-[id number] hypermetabolic lymphadenopathy in the neck, left axilla, mediastinum, retroperitoneum, and left pelvis.  Hypermetabolic skeletal lesions, generalized bilateral pulmonary hypermetabolism -08/30/2021-1 unit RBCs -Excisional lymph node biopsy left groin 08/30/2021-preliminary non-Hodgkin's lymphoma Fatigue Dyspnea on exertion Unsteady gait/balance disorder, followed by Dr. Tomi Likens CKD Atrial fibrillation Hypertension Sleep apnea Anorexia/weight loss 06/13/2021 skin rash, question vasculitis-course of prednisone initiated; 06/20/2021 marked improvement, rash recurred when she was tapered off of prednisone, prednisone resumed by dermatology Coagulopathy-most likely related to malnutrition and apixaban  Ms. Daley appears unchanged.  Echocardiogram performed 09/04/2021 showed  LVEF of 65 to 70%.  Carotid Doppler ultrasound showed mild stenosis.  The patient is newly diagnosed with diffuse large B-cell lymphoma.  She is symptomatic from her underlying malignancy.  We will plan to initiate her first cycle of R-CHOP today in the hospital.  She will receive dexamethasone today with her chemotherapy and then we will start her on prednisone 60 mg p.o. daily x4 days starting tomorrow.  She will also start Granix 480 mcg subcu daily starting tomorrow.  Orders have been entered.  Okay to administer chemotherapy despite counts.  She will continue on allopurinol due to risk of tumor lysis syndrome.  She has persistent pancytopenia due to her underlying malignancy.  We will continue to monitor and transfuse as needed.  Recommendations: 1.  Begin cycle 1 of R-CHOP today in the hospital.  Okay to treat despite counts. 2.  She will receive dexamethasone with her chemotherapy today and then begin prednisone 60 mg p.o. daily x4 days starting tomorrow. 3.  Begin Granix 480 mcg subcu daily on 09/06/2021. 4.  Continue allopurinol.  [name]  Ms. Scallan was interviewed and examined.  Her daughter was at the bedside when I saw her at approximately 7 AM this morning.  She appears stable.  The plan is to begin systemic therapy with CHOP-rituximab today.  She is at increased risk of developing hematologic toxicity.  Chemotherapy will be dose reduced and she will receive G-CSF support.  She is maintained on intravenous hydration and allopurinol as prophylaxis against tumor lysis syndrome.  She will begin G-CSF tomorrow.  I will check on her over the weekend.  I was present for greater than 50% of today's visit.  I performed medical decision making.  SUBJECTIVE: Remains weak this morning.  She is not having any fevers or chills.  She is ready for her first cycle of chemotherapy to begin today.   PHYSICAL EXAMINATION:  Vitals:   09/04/21 2044 09/05/21 0346  BP: (!) 112/55 (!) 115/43   Pulse: 66 77  Resp: 19 19  Temp: 98.2 F (36.8 C) 98.2 F (36.8 C)  SpO2: 97% 94%   Filed Weights   09/03/21 1546 09/04/21 0500  Weight: 79.8 kg 89.7 kg    Intake/Output from previous day: 08/24 0701 - 08/25 0700 In: 350 [I.V.:350] Out: 3 [Urine:3]  Physical Exam Vitals reviewed.  Constitutional:      General: She is not in acute distress. Eyes:     General: No scleral icterus.    Conjunctiva/sclera: Conjunctivae normal.  Cardiovascular:     Rate and Rhythm: Normal rate.  Pulmonary:     Effort: Pulmonary effort is normal. No respiratory distress.  Skin:    General: Skin is warm and dry.  Neurological:     Mental Status: She is alert and oriented to person, place, and time.   Left groin biopsy site without bleeding or evidence of infection LABORATORY DATA:  I have reviewed the data as listed    Latest Ref Rng & Units 09/05/2021    4:10 AM 09/04/2021    3:40 AM 09/03/2021   12:20 PM  CMP  Glucose 70 - 99 mg/dL 108  129  102   BUN 8 - 23 mg/dL 20  26  25    Creatinine 0.44 - 1.00 mg/dL 1.06  1.30  1.43   Sodium 135 - 145 mmol/L 140  140  140   Potassium 3.5 - 5.1 mmol/L 3.8  4.0  4.2   Chloride 98 - 111 mmol/L 114  114  113   CO2 22 - 32 mmol/L 21  22  20    Calcium 8.9 - 10.3 mg/dL 7.8  8.0  8.6   Total Protein 6.5 - 8.1 g/dL  4.3    Total Bilirubin 0.3 - 1.2 mg/dL  0.8    Alkaline Phos 38 - 126 U/L  73    AST 15 - 41 U/L  43    ALT 0 - 44 U/L  34      Lab Results  Component Value Date   WBC 1.5 (L) 09/05/2021   HGB 7.1 (L) 09/05/2021   HCT 23.2 (L) 09/05/2021   MCV 95.9 09/05/2021   PLT 61 (L) 09/05/2021   NEUTROABS 1.0 (L) 09/05/2021    No results found for: "CEA1", "CEA", "CAN199", "CA125", "PSA1"  VAS US CAROTID  Result Date: 09/04/2021 Carotid Arterial Duplex Study Patient Name:  [name] [name]  Date of Exam:   09/04/2021 Medical Rec #: [id number]      Accession #:    [id number] Date of Birth: 08/07/1945       Patient Gender: F Patient Age:   58 years  Exam Location:  [facility] Procedure:      VAS US CAROTID Referring Phys: DAVID ORTIZ --------------------------------------------------------------------------------  Indications:       Syncope. Risk Factors:      Hypertension, hyperlipidemia. Limitations        Today's exam was limited due to the patient's respiratory                    variation. Comparison Study:  No prior studies. Performing Technologist: Oliver Hum RVT  Examination Guidelines: A complete evaluation includes B-mode imaging, spectral Doppler, color Doppler, and power Doppler as needed of all accessible portions of each vessel. Bilateral testing is considered an integral part of a complete examination. Limited examinations for reoccurring indications may be performed as noted.  Right Carotid Findings: +----------+--------+--------+--------+-----------------------+--------+           PSV cm/sEDV cm/sStenosisPlaque Description     Comments +----------+--------+--------+--------+-----------------------+--------+ CCA Prox  92  12                                     tortuous +----------+--------+--------+--------+-----------------------+--------+ CCA Distal73      14              smooth and heterogenous         +----------+--------+--------+--------+-----------------------+--------+ ICA Prox  74      16              smooth and heterogenous         +----------+--------+--------+--------+-----------------------+--------+ ICA Distal65      14                                     tortuous +----------+--------+--------+--------+-----------------------+--------+ ECA       89      4                                               +----------+--------+--------+--------+-----------------------+--------+ +----------+--------+-------+--------+-------------------+           PSV cm/sEDV cmsDescribeArm Pressure (mmHG) +----------+--------+-------+--------+-------------------+ [id number]                                         +----------+--------+-------+--------+-------------------+ +---------+--------+--+--------+-+---------+ VertebralPSV cm/s57EDV cm/s8Antegrade +---------+--------+--+--------+-+---------+  Left Carotid Findings: +----------+-------+-------+--------+---------------------------------+--------+           PSV    EDV    StenosisPlaque Description               Comments           cm/s   cm/s                                                     +----------+-------+-------+--------+---------------------------------+--------+ CCA Prox  95     14                                                       +----------+-------+-------+--------+---------------------------------+--------+ CCA Distal77     14             smooth and heterogenous                   +----------+-------+-------+--------+---------------------------------+--------+ ICA Prox  86     19             calcific, irregular and          tortuous                                 heterogenous                              +----------+-------+-------+--------+---------------------------------+--------+ ICA Distal78     15  tortuous +----------+-------+-------+--------+---------------------------------+--------+ ECA       151    14                                                       +----------+-------+-------+--------+---------------------------------+--------+ +----------+--------+--------+--------+-------------------+           PSV cm/sEDV cm/sDescribeArm Pressure (mmHG) +----------+--------+--------+--------+-------------------+ [id number]                                         +----------+--------+--------+--------+-------------------+ +---------+--------+--+--------+--+---------+ VertebralPSV cm/s54EDV cm/s12Antegrade +---------+--------+--+--------+--+---------+   Summary: Right Carotid: Velocities in the  right ICA are consistent with a 1-39% stenosis. Left Carotid: Velocities in the left ICA are consistent with a 1-39% stenosis. Vertebrals: Bilateral vertebral arteries demonstrate antegrade flow. *See table(s) above for measurements and observations.  Electronically signed by Monica Martinez MD on 09/04/2021 at 1:02:46 PM.    Final    ECHOCARDIOGRAM COMPLETE  Result Date: 09/04/2021    ECHOCARDIOGRAM REPORT   Patient Name:   DONDA FRIEDLI Date of Exam: 09/04/2021 Medical Rec #:  [id number]     Height:       67.0 in Accession #:    [id number]    Weight:       197.8 lb Date of Birth:  11-23-1945      BSA:          2.013 m Patient Age:    48 years      BP:           115/51 mmHg Patient Gender: F             HR:           83 bpm. Exam Location:  Inpatient Procedure: 2D Echo, Cardiac Doppler and Color Doppler Indications:    Chemotherapy  History:        Patient has prior history of Echocardiogram examinations, most                 recent 08/09/2020. Risk Factors:Sleep Apnea, Diabetes and                 Hypertension.  Sonographer:    Jefferey Pica Referring Phys: [place]  1. Left ventricular ejection fraction, by estimation, is 65 to 70%. The left ventricle has normal function. The left ventricle has no regional wall motion abnormalities. Left ventricular diastolic parameters were normal.  2. Right ventricular systolic function is normal. The right ventricular size is normal. There is moderately elevated pulmonary artery systolic pressure.  3. The mitral valve is degenerative. Mild mitral valve regurgitation. Moderate mitral annular calcification.  4. The aortic valve is tricuspid. There is mild calcification of the aortic valve. There is mild thickening of the aortic valve. Aortic valve regurgitation is not visualized. Aortic valve sclerosis is present, with no evidence of aortic valve stenosis.  5. The inferior vena cava is normal in size with greater than 50% respiratory variability,  suggesting right atrial pressure of 3 mmHg. Comparison(s): No significant change from prior study. FINDINGS  Left Ventricle: Left ventricular ejection fraction, by estimation, is 65 to 70%. The left ventricle has normal function. The left ventricle has no regional wall motion abnormalities. The left ventricular internal cavity size was normal in size.  There is  no left ventricular hypertrophy. Left ventricular diastolic parameters were normal. Right Ventricle: The right ventricular size is normal. No increase in right ventricular wall thickness. Right ventricular systolic function is normal. There is moderately elevated pulmonary artery systolic pressure. The tricuspid regurgitant velocity is 3.62 m/s, and with an assumed right atrial pressure of 5 mmHg, the estimated right ventricular systolic pressure is 45.8 mmHg. Left Atrium: Left atrial size was normal in size. Right Atrium: Right atrial size was normal in size. Pericardium: There is no evidence of pericardial effusion. Mitral Valve: The mitral valve is degenerative in appearance. There is mild thickening of the mitral valve leaflet(s). There is mild calcification of the mitral valve leaflet(s). Moderate mitral annular calcification. Mild mitral valve regurgitation. Tricuspid Valve: The tricuspid valve is normal in structure. Tricuspid valve regurgitation is mild. Aortic Valve: The aortic valve is tricuspid. There is mild calcification of the aortic valve. There is mild thickening of the aortic valve. Aortic valve regurgitation is not visualized. Aortic valve sclerosis is present, with no evidence of aortic valve stenosis. Aortic valve peak gradient measures 12.1 mmHg. Pulmonic Valve: The pulmonic valve was normal in structure. Pulmonic valve regurgitation is trivial. Aorta: The aortic root and ascending aorta are structurally normal, with no evidence of dilitation. Venous: The inferior vena cava is normal in size with greater than 50% respiratory variability,  suggesting right atrial pressure of 3 mmHg. IAS/Shunts: The atrial septum is grossly normal.  LEFT VENTRICLE PLAX 2D LVIDd:         4.20 cm   Diastology LVIDs:         2.30 cm   LV e' medial:    7.88 cm/s LV PW:         1.10 cm   LV E/e' medial:  16.6 LV IVS:        1.00 cm   LV e' lateral:   9.08 cm/s LVOT diam:     1.80 cm   LV E/e' lateral: 14.4 LV SV:         88 LV SV Index:   43 LVOT Area:     2.54 cm  RIGHT VENTRICLE RV S prime:     19.20 cm/s TAPSE (M-mode): 2.6 cm LEFT ATRIUM             Index        RIGHT ATRIUM           Index LA diam:        4.50 cm 2.24 cm/m   RA Area:     17.30 cm LA Vol (A2C):   57.4 ml 28.52 ml/m  RA Volume:   45.00 ml  22.36 ml/m LA Vol (A4C):   54.0 ml 26.83 ml/m LA Biplane Vol: 57.5 ml 28.57 ml/m  AORTIC VALVE                  PULMONIC VALVE AV Area (Vmax): 2.33 cm      PV Vmax:       1.12 m/s AV Vmax:        174.00 cm/s   PV Peak grad:  5.0 mmHg AV Peak Grad:   12.1 mmHg LVOT Vmax:      159.00 cm/s LVOT Vmean:     108.000 cm/s LVOT VTI:       0.344 m  AORTA Ao Root diam: 3.10 cm Ao Asc diam:  3.40 cm MITRAL VALVE  TRICUSPID VALVE MV Area (PHT): 3.65 cm     TR Peak grad:   52.4 mmHg MV Decel Time: 208 msec     TR Vmax:        362.00 cm/s MV E velocity: 131.00 cm/s MV A velocity: 38.60 cm/s   SHUNTS MV E/A ratio:  3.39         Systemic VTI:  0.34 m                             Systemic Diam: 1.80 cm Gwyndolyn Kaufman MD Electronically signed by Gwyndolyn Kaufman MD Signature Date/Time: 09/04/2021/11:09:03 AM    Final    [place] EKG SITE RITE  Result Date: 09/03/2021 If Site Rite image not attached, placement could not be confirmed due to current cardiac rhythm.  DG Chest Portable 1 View  Result Date: 09/03/2021 CLINICAL DATA:  Provided history: Hematemesis. Additional history provided: Weakness, recent diagnosis of lymphoma. EXAM: PORTABLE CHEST 1 VIEW COMPARISON:  Head CT 08/27/2021. Prior chest radiographs 04/20/2021 and earlier. FINDINGS: Heart size at the  upper limits of normal. Minimal atelectasis within the left lung base. No appreciable airspace consolidation or pulmonary edema. No evidence of pleural effusion or pneumothorax. No acute bony abnormality identified. Dextrocurvature of the thoracic spine. IMPRESSION: Minimal atelectasis within the left lung base. Otherwise, no evidence of acute cardiopulmonary abnormality. Electronically Signed   By: Kellie Simmering D.O.   On: 09/03/2021 12:20   NM PET Image Initial (PI) Skull Base To Thigh  Result Date: 08/29/2021 CLINICAL DATA:  Initial treatment strategy for progressive pancytopenia. Severe fatigue. Anorexia with weight loss. Night sweats. EXAM: NUCLEAR MEDICINE PET SKULL BASE TO THIGH TECHNIQUE: 9.1 mCi F-18 FDG was injected intravenously. Full-ring PET imaging was performed from the skull base to thigh after the radiotracer. CT data was obtained and used for attenuation correction and anatomic localization. Fasting blood glucose: 99 mg/dl COMPARISON:  02/17/2021 CT chest, abdomen and pelvis. FINDINGS: Mediastinal blood pool activity: SUV max 2.7 Liver activity: SUV max 4.3 NECK: Several small hypermetabolic bilateral neck lymph nodes. Representative left level 2 neck 0.5 cm node with max SUV 10.5 (series 4/image 34). Representative 0.6 cm right level 3 neck node with max SUV 13.8 (series 4/image 38). Incidental CT findings: None. CHEST: Hypermetabolic left axillary lymphadenopathy with dominant 1.0 cm left axillary node with max SUV 23.6 (series 4/image 59). No enlarged or hypermetabolic right axillary nodes. Several mildly enlarged hypermetabolic bilateral mediastinal lymph nodes. Representative 1.1 cm right paratracheal node with max SUV 14.2 (series 4/image 65). Representative 1.0 cm subcarinal node with max SUV 29.5 (series 4/image 75). Hypermetabolic left hilar adenopathy with max SUV 7.4. No hypermetabolic right hilar adenopathy. Generalized hypermetabolism throughout the bilateral lung fields with  associated faint ground-glass opacity in both lungs with representative max SUV 5.1 in the left lower lobe. Incidental CT findings: Coronary atherosclerosis. Atherosclerotic nonaneurysmal thoracic aorta. No significant pulmonary nodules. ABDOMEN/PELVIS: Widespread hypermetabolic lymphadenopathy in the porta hepatis, portacaval, aortocaval, left para-aortic, mesenteric, left common iliac, left external iliac and left inguinal nodal chains. Representative 1.3 cm porta hepatis node with max SUV 13.0 (series 5/image 115). Representative 1.3 cm aortocaval node with max SUV 25.7 (series 4/image 141). Representative 1.5 cm left para-aortic node with max SUV 26.7 (series 4/image 138). Representative 1.0 cm right mesenteric node with max SUV 21.2 (series 4/image 145). Representative 1.3 cm left external iliac node with max SUV 15.8 (series 4/image 172). Representative 1.0 cm  left inguinal node with max SUV 15.6 (series 4/image 187). Mild-to-moderate splenomegaly. Craniocaudal splenic length 16.3 cm. Splenic FDG uptake is equivalent to the liver with max SUV 4.4. No focal splenic masses. No abnormal hypermetabolic activity within the liver, pancreas or adrenal glands. Incidental CT findings: Postsurgical changes from fundoplication in the proximal stomach. Atherosclerotic nonaneurysmal abdominal aorta. Hysterectomy. SKELETON: Multiple hypermetabolic skeletal lesions in the axial and visualized proximal appendicular skeleton. Representative right T11 vertebral/posterior element lesion with max SUV 15.4. Representative proximal left humerus metaphysis lesion with max SUV 5.0. Representative left mandibular body lesion with max SUV 6.7. Incidental CT findings: None. IMPRESSION: 1. Widespread intensely hypermetabolic lymphadenopathy in the neck, left axilla, mediastinum, retroperitoneum and left pelvis. Mild-to-moderate splenomegaly. Scattered hypermetabolic skeletal lesions. Generalized bilateral pulmonary hypermetabolism with  associated faint ground-glass opacity. Findings are most compatible with lymphoproliferative disorder/lymphoma. Deauville score 5. 2. Chronic findings include: Aortic Atherosclerosis (ICD10-I70.0). Coronary atherosclerosis. Electronically Signed   By: Ilona Sorrel M.D.   On: 08/29/2021 10:09   IR BONE MARROW BIOPSY & ASPIRATION  Result Date: 08/13/2021 INDICATION: pancytopenia, sweats, splenomegaly, anorexia EXAM: Bone marrow aspiration and core biopsy using fluoroscopic guidance MEDICATIONS: None. ANESTHESIA/SEDATION: Moderate (conscious) sedation was employed during this procedure. A total of Versed 2 mg and Fentanyl 100 mcg was administered intravenously. Moderate Sedation Time: 10 minutes. The patient's level of consciousness and vital signs were monitored continuously by radiology nursing throughout the procedure under my direct supervision. FLUOROSCOPY TIME:  Fluoroscopy Time: 1.8 minutes (18 mGy) COMPLICATIONS: None immediate. PROCEDURE: Informed written consent was obtained from the patient after a thorough discussion of the procedural risks, benefits and alternatives. All questions were addressed. Maximal Sterile Barrier Technique was utilized including caps, mask, sterile gowns, sterile gloves, sterile drape, hand hygiene and skin antiseptic. A timeout was performed prior to the initiation of the procedure. The patient was placed prone on the IR exam table. Limited fluoroscopy of the pelvis was performed for planning purposes. Skin entry site was marked, and the overlying skin was prepped and draped in the standard sterile fashion. Local analgesia was obtained with 1% lidocaine. Using fluoroscopic guidance, an 11 gauge needle was advanced just deep to the cortex of the right posterior ilium. Subsequently, bone marrow aspiration and core biopsy were performed. Specimens were submitted to lab/pathology for handling. Hemostasis was achieved with manual pressure, and a clean dressing was placed. The patient  tolerated the procedure well without immediate complication. IMPRESSION: Successful bone marrow aspiration and core biopsy of the right posterior ilium using fluoroscopic guidance. Electronically Signed   By: Albin Felling M.D.   On: 08/13/2021 14:00     Future Appointments  Date Time Provider [place]  09/22/2021 12:00 PM [facility]  10/09/2021  9:00 AM Jerline Pain, MD [facility]      LOS: 1 day

## 2021-09-06 ENCOUNTER — Encounter: Payer: Self-pay | Admitting: Nurse Practitioner

## 2021-09-06 ENCOUNTER — Encounter: Payer: Self-pay | Admitting: Oncology

## 2021-09-06 DIAGNOSIS — R55 Syncope and collapse: Secondary | ICD-10-CM | POA: Diagnosis not present

## 2021-09-06 DIAGNOSIS — D61818 Other pancytopenia: Secondary | ICD-10-CM | POA: Diagnosis not present

## 2021-09-06 DIAGNOSIS — C8598 Non-Hodgkin lymphoma, unspecified, lymph nodes of multiple sites: Secondary | ICD-10-CM | POA: Diagnosis not present

## 2021-09-06 LAB — CBC WITH DIFFERENTIAL/PLATELET
Abs Immature Granulocytes: 0.01 10*3/uL (ref 0.00–0.07)
Basophils Absolute: 0 10*3/uL (ref 0.0–0.1)
Basophils Relative: 0 %
Eosinophils Absolute: 0 10*3/uL (ref 0.0–0.5)
Eosinophils Relative: 1 %
HCT: 22.4 % — ABNORMAL LOW (ref 36.0–46.0)
Hemoglobin: 6.8 g/dL — CL (ref 12.0–15.0)
Immature Granulocytes: 1 %
Lymphocytes Relative: 15 %
Lymphs Abs: 0.2 10*3/uL — ABNORMAL LOW (ref 0.7–4.0)
MCH: 29.4 pg (ref 26.0–34.0)
MCHC: 30.4 g/dL (ref 30.0–36.0)
MCV: 97 fL (ref 80.0–100.0)
Monocytes Absolute: 0.1 10*3/uL (ref 0.1–1.0)
Monocytes Relative: 7 %
Neutro Abs: 1 10*3/uL — ABNORMAL LOW (ref 1.7–7.7)
Neutrophils Relative %: 76 %
Platelets: 64 10*3/uL — ABNORMAL LOW (ref 150–400)
RBC: 2.31 MIL/uL — ABNORMAL LOW (ref 3.87–5.11)
RDW: 18.6 % — ABNORMAL HIGH (ref 11.5–15.5)
WBC: 1.4 10*3/uL — CL (ref 4.0–10.5)
nRBC: 0 % (ref 0.0–0.2)

## 2021-09-06 LAB — HEMOGLOBIN AND HEMATOCRIT, BLOOD
HCT: 29.1 % — ABNORMAL LOW (ref 36.0–46.0)
Hemoglobin: 9.2 g/dL — ABNORMAL LOW (ref 12.0–15.0)

## 2021-09-06 LAB — PHOSPHORUS: Phosphorus: 3.9 mg/dL (ref 2.5–4.6)

## 2021-09-06 LAB — PREPARE RBC (CROSSMATCH)

## 2021-09-06 LAB — BASIC METABOLIC PANEL
Anion gap: 5 (ref 5–15)
BUN: 25 mg/dL — ABNORMAL HIGH (ref 8–23)
CO2: 20 mmol/L — ABNORMAL LOW (ref 22–32)
Calcium: 7.9 mg/dL — ABNORMAL LOW (ref 8.9–10.3)
Chloride: 117 mmol/L — ABNORMAL HIGH (ref 98–111)
Creatinine, Ser: 0.96 mg/dL (ref 0.44–1.00)
GFR, Estimated: >60 mL/min (ref >60)
Glucose, Bld: 196 mg/dL — ABNORMAL HIGH (ref 70–99)
Potassium: 3.9 mmol/L (ref 3.5–5.1)
Sodium: 142 mmol/L (ref 135–145)

## 2021-09-06 LAB — URIC ACID: Uric Acid, Serum: 4.2 mg/dL (ref 2.5–7.1)

## 2021-09-06 MED ORDER — SODIUM CHLORIDE 0.9% IV SOLUTION: Freq: Once | INTRAVENOUS | Status: AC

## 2021-09-06 NOTE — Progress Notes (Signed)
PROGRESS NOTE   [name] [name]  ZOX:[id number]    DOB: 01/26/1945    DOA: 09/03/2021  PCP: Harlan Stains, MD   I have briefly reviewed patients previous medical records in [facility].  Chief Complaint  Patient presents with   Weakness    Brief Narrative:  76 year old female with PMH of persistent A-fib, stage III CKD, anxiety and depression, GERD, HLD, HTN, prediabetes, OSA on no CPAP, hypothyroidism, recent hospital admission 08/28/2021 - 09/01/2021 for generalized weakness, poor appetite, nightly chills over the last year and a half, and a 40 pound weight loss, underwent lymph node biopsy, newly diagnosed with diffuse large B cell lymphoma, seen in oncology office on 8/22 with plans to initiate systemic chemotherapy with R-CHOP next week, readmitted to the hospital on 09/03/2021 due to worsening fatigue, weakness and a syncopal episode while in the ED.  Oncology consulted and started first cycle of R-CHOP in the hospital on 8/25.   Assessment & Plan:  Principal Problem:   Syncope Active Problems:   Generalized anxiety disorder   PAF (paroxysmal atrial fibrillation) (HCC)   Hyperlipidemia   Essential hypertension   Gastroesophageal reflux disease without esophagitis   (HFpEF) heart failure with preserved ejection fraction (HCC)   OSA (obstructive sleep apnea)   Hypothyroidism   Pancytopenia (HCC)   NHL (non-Hodgkin's lymphoma) (HCC)   Prediabetes   Large B cell lymphoma: History as noted above.  New diagnosis.  Final pathology from inguinal lymph node biopsy confirms diffuse large B-cell lymphoma.  Oncology input appreciated and S/P first cycle of R-CHOP in the hospital on 8/25.  Prednisone 60 Mg daily x4 days.  Continue allopurinol.  Serum uric acid 5.2 > 4.2.  Further management per oncology.  Follow daily CBCs.  Pancytopenia: Secondary to lymphoma.  Follow CBCs closely.  Transfuse PRBC if hemoglobin 7 g or less, platelets if counts less than 20 K or bleeding .  Starting  Granix 8/26.  Hemoglobin down to 6.8 and WBC 1.4 on 8/26.  Transfusing 2 units PRBC.  Platelets stable.  Follow daily CBC.  Hemoptysis: Suspect low volume, "spit up some blood".  Unclear etiology.  Is on apixaban and also has thrombocytopenia as above.  No further episodes.  As discussed with Dr. Benay Spice, expect thrombocytopenia to worsen after chemo and risks of bleeding outweigh benefits of apixaban anticoagulation for stroke prophylaxis, hence discontinued Eliquis for now.  No further episodes.  As discussed with spouse at bedside, appears to be minimal.  Syncope: Reportedly occurred in ED triage on 8/23.  Bolused with 2 L of IV saline.  HS Troponin x2 negative at 11 > 9.  TTE: LVEF 65-70%, no aortic stenosis.  No significant change from prior study.  CUS: Bilateral ICA consistent with 1-39% stenosis.  Check orthostatics.  If patient drives, should be advised not to drive for 6 months.  Telemetry had shown sinus rhythm without arrhythmias or pauses  Anxiety disorder: Continue home dose of Remeron, bupropion and alprazolam.  Persistent atrial fibrillation: CHA2DS2-VASc score: 6.  Continue amiodarone, Toprol-XL.  Eliquis discontinued for now.  Currently in sinus rhythm.  Off telemetry.  Hyperlipidemia: Continue pravastatin  Essential hypertension: Controlled on metoprolol succinate, continue  GERD without esophagitis: Continue pantoprazole  Chronic diastolic CHF: Lower extremity edema but no respiratory symptoms.  Discontinued IV fluids.  Added lower extremity compression stockings.  OSA, not on CPAP Noted  Hypothyroidism TSH 0.149 in the context of acute illness.  Clinically euthyroid.  Check free T3 and free T4,  will likely resume prior home dose of Synthroid.  Prediabetes A1c 5.8 in 2018.  Good glycemic control since hospital admission.  DC'd CBGs.  Outpatient follow-up.  Acute kidney injury complicating chronic kidney disease stage II Baseline creatinine initially felt to be  in the 1.4-1.6 range.  However creatinine has steadily decreased down to 0.96 since admission.  Thereby not sure about above baseline.  Currently I feel she has stage II CKD and not stage III.  Elevated lactate/lactic acidosis: Resolved.  Body mass index is 33.04 kg/m.   DVT prophylaxis: SCDs Start: 09/03/21 1459.     Code Status: DNR:  Family Communication: Spouse at bedside Disposition:  Status is: Inpatient Remains inpatient appropriate because: Pancytopenia, post chemo, needs to be closely monitored and addressed as needed.  Likely will remain in the hospital 2 to 3 days     Consultants:   Medical oncology  Procedures:     Antimicrobials:      Subjective:  Transiently dizzy/swimmy head this morning.  Overall feels tired and fatigued.  Night sweats last night and felt cold after the sweats.  No dyspnea or chest pain.  Objective:   Vitals:   09/06/21 0500 09/06/21 0656 09/06/21 1401 09/06/21 1423  BP:  109/65 (!) 102/55 (!) 106/55  Pulse:  (!) 58 60 61  Resp:  '14 14 14  '$ Temp:  98 F (36.7 C) 97.7 F (36.5 C) 97.8 F (36.6 C)  TempSrc:  Oral Oral Oral  SpO2:  100% 100% 100%  Weight: 95.7 kg     Height:        General exam: Elderly female, small built, frail and chronically ill looking lying comfortably propped up in bed without distress. Respiratory system: Clear to auscultation.  No increased work of breathing. Cardiovascular system: S1 and S2 heard, RRR.  No JVD, murmurs.  1+ pitting bilateral leg edema.  Off telemetry. Gastrointestinal system: Abdomen is nondistended, soft and nontender. No organomegaly or masses felt. Normal bowel sounds heard. Central nervous system: Alert and oriented. No focal neurological deficits. Extremities: Symmetric 5 x 5 power. Skin: No rashes, lesions or ulcers Psychiatry: Judgement and insight appear normal. Mood & affect: More cheerful today.   Data Reviewed:   I have personally reviewed following labs and imaging  studies   CBC: Recent Labs  Lab 09/04/21 0340 09/05/21 0410 09/06/21 0957  WBC 1.9* 1.5* 1.4*  NEUTROABS 1.3* 1.0* 1.0*  HGB 7.4* 7.1* 6.8*  HCT 24.2* 23.2* 22.4*  MCV 96.0 95.9 97.0  PLT 64* 61* 64*    Basic Metabolic Panel: Recent Labs  Lab 08/31/21 0541 09/01/21 0519 09/01/21 0519 09/02/21 1352 09/03/21 1220 09/03/21 1224 09/04/21 0340 09/05/21 0410 09/06/21 0957  NA  --  141   < > 136 140  --  140 140 142  K  --  3.8   < > 4.1 4.2  --  4.0 3.8 3.9  CL  --  120*   < > 110 113*  --  114* 114* 117*  CO2  --  18*   < > 16* 20*  --  22 21* 20*  GLUCOSE  --  164*   < > 174* 102*  --  129* 108* 196*  BUN  --  27*   < > 27* 25*  --  26* 20 25*  CREATININE  --  1.50*  --  1.30* 1.43*  --  1.30* 1.06* 0.96  CALCIUM  --  8.2*   < > 8.2* 8.6*  --  8.0* 7.8* 7.9*  MG 2.0 2.0  --   --   --  1.8  --   --   --   PHOS 3.1 3.0  --   --   --   --   --   --  3.9   < > = values in this interval not displayed.    Liver Function Tests: Recent Labs  Lab 09/01/21 0519 09/02/21 1352 09/04/21 0340  AST 21 54* 43*  ALT 12 19 34  ALKPHOS 45 72 73  BILITOT 0.6 1.0 0.8  PROT 4.2* 4.8* 4.3*  ALBUMIN 2.0* 2.5* 1.9*    CBG: Recent Labs  Lab 09/03/21 1158 09/04/21 0745 09/04/21 1158  GLUCAP 102* 87 119*    Microbiology Studies:   Recent Results (from the past 240 hour(s))  Culture, blood (Routine X 2) w Reflex to ID Panel     Status: None   Collection Time: 08/29/21  9:11 AM   Specimen: BLOOD  Result Value Ref Range Status   Specimen Description   Final    BLOOD LEFT ANTECUBITAL Performed at [facility], [place] [address], [place], [place] [address]    Special Requests   Final    BOTTLES DRAWN AEROBIC AND ANAEROBIC Blood Culture adequate volume Performed at [place] [address], [place], [place] [address]    Culture   Final    NO GROWTH 5 DAYS Performed at [facility], [place] [address] [place], [place] [address]     Report Status 09/03/2021 FINAL  Final  Culture, blood (Routine X 2) w Reflex to ID Panel     Status: None   Collection Time: 08/29/21  9:27 AM   Specimen: BLOOD  Result Value Ref Range Status   Specimen Description   Final    BLOOD RIGHT ANTECUBITAL Performed at [facility] [address], [place], [place] [address]    Special Requests   Final    BOTTLES DRAWN AEROBIC AND ANAEROBIC Blood Culture adequate volume Performed at [facility] [address], [place], [place] [address]    Culture   Final    NO GROWTH 5 DAYS Performed at [facility], [place] [address] [place], [place] [address]    Report Status 09/03/2021 FINAL  Final  SARS Coronavirus 2 by RT PCR (hospital order, performed in [facility] hospital lab) *cepheid single result test* Anterior Nasal Swab     Status: None   Collection Time: 09/03/21  2:22 PM   Specimen: Anterior Nasal Swab  Result Value Ref Range Status   SARS Coronavirus 2 by RT PCR NEGATIVE NEGATIVE Final    Comment: (NOTE) SARS-CoV-2 target nucleic acids are NOT DETECTED.  The SARS-CoV-2 RNA is generally detectable in upper and lower respiratory specimens during the acute phase of infection. The lowest concentration of SARS-CoV-2 viral copies this assay can detect is 250 copies / mL. A negative result does not preclude SARS-CoV-2 infection and should not be used as the sole basis for treatment or other patient management decisions.  A negative result may occur with improper specimen collection / handling, submission of specimen other than nasopharyngeal swab, presence of viral mutation(s) within the areas targeted by this assay, and inadequate number of viral copies (<250 copies / mL). A negative result must be combined with clinical observations, patient history, and epidemiological information.  Fact Sheet for Patients:   https://www.patel.info/  Fact Sheet for Healthcare  Providers: https://hall.com/  This test is not yet approved or  cleared by the [place] and has been authorized for detection and/or diagnosis of SARS-CoV-2 by FDA under an Emergency Use Authorization (EUA).  This EUA will remain in effect (meaning this test can be used) for the duration of the COVID-19 declaration under Section 564(b)(1) of the Act, 21 U.S.C. section 360bbb-3(b)(1), unless the authorization is terminated or revoked sooner.  Performed at [facility], [place] [address], [place], [place] [address]     Radiology Studies:  No results found.  Scheduled Meds:    allopurinol  150 mg Oral Daily   amiodarone  200 mg Oral Daily   buPROPion  150 mg Oral Daily   Chlorhexidine Gluconate Cloth  6 each Topical Daily   loratadine  10 mg Oral Daily   magnesium oxide  200 mg Oral Daily   metoprolol succinate  25 mg Oral QHS   mirtazapine  30 mg Oral QHS   pantoprazole  40 mg Oral Daily   pravastatin  40 mg Oral QHS   predniSONE  60 mg Oral Q breakfast   sodium chloride flush  10-40 mL Intracatheter Q12H   sodium chloride flush  3 mL Intravenous Q12H   Tbo-filgastrim (GRANIX) SQ  480 mcg Subcutaneous q1800    Continuous Infusions:    sodium chloride 75 mL/hr at 09/06/21 0734     LOS: 2 days     Vernell Leep, MD,  FACP, [facility], [facility], [facility] (Care Management Physician Certified) [place]  To contact the attending provider between 7A-7P or the covering provider during after hours 7P-7A, please log into the web site [email or web address] and access using universal [place] password for that web site. If you do not have the password, please call the hospital operator.  09/06/2021, 4:25 PM

## 2021-09-06 NOTE — Progress Notes (Signed)
HEMATOLOGY-ONCOLOGY PROGRESS NOTE  ASSESSMENT AND PLAN: Large B-cell lymphoma presenting with macrocytic anemia/thrombocytopenia -08/13/2020 B12 3500 -08/13/2020 LDH 612 -08/13/2020 myeloma panel-no monoclonal protein  -Bone marrow biopsy 08/20/2020-hypercellular marrow with erythroid hyperplasia and dyspoiesis, rare lipogranuloma like lesions.  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  Negative myeloma FISH panel, 46XX; neotype myeloid disorders profile NGS sequencing-no pathogenic mutations detected in any of the genes on the NGS panel -PNH screen - 08/26/2020 -CTs 02/17/2021-new splenomegaly; diffuse bilateral bronchial wall thickening; clustered groundglass and fine nodular opacity in the dependent right lower lobe.  CAD. -Bone marrow biopsy 08/13/2021-hypercellular bone marrow for age with trilineage hematopoiesis; several atypical lymphoid aggregates present; no increase in blastic cells; flow cytometry without significant T or B-cell abnormalities; normal cytogenetics; "the limited morphologic and immunohistochemical features are atypical and a lymphoproliferative process is not excluded" -PET scan 07/12/1599-UXNATFTDDU hypermetabolic lymphadenopathy in the neck, left axilla, mediastinum, retroperitoneum, and left pelvis.  Hypermetabolic skeletal lesions, generalized bilateral pulmonary hypermetabolism -08/30/2021-1 unit RBCs -Excisional lymph node biopsy left groin 08/30/2021-Large B-cell lymphoma, CD20 positive, CD30 positive -Cycle 1 R-CHOP 09/05/2021 Fatigue Dyspnea on exertion Unsteady gait/balance disorder, followed by Dr. Tomi Likens CKD Atrial fibrillation Hypertension Sleep apnea Anorexia/weight loss 06/13/2021 skin rash, question vasculitis-course of prednisone initiated; 06/20/2021 marked improvement, rash recurred when she was tapered off of prednisone, prednisone resumed by dermatology Coagulopathy-most likely related to malnutrition and apixaban  [name] [name] tolerated the 1  R-CHOP well.  Her performance status appears improved.  She will begin prednisone and G-CSF today.  She would like to undergo Port-A-Cath placement as soon as possible. Recommendations: 1.  Begin G-CSF and prednisone today  2.  Continue allopurinol 3.  Increase ambulation 4.  Follow-up chemistry panel and uric acid from this a.m. 5.  Transfuse packed red blood cells today or tomorrow 6.  I will place order for Port-A-Cath placement-hopefully a Port-A-Cath can be placed prior to discharge  [place]: [name] [name] reports feeling "great ".  She developed chest tightness during the rituximab infusion, but was able to complete the infusion after receiving rituximab.  No nausea or vomiting.  No rash.   PHYSICAL EXAMINATION:  Vitals:   09/06/21 0233 09/06/21 0656  BP: 115/77 109/65  Pulse: 64 (!) 58  Resp: 14 14  Temp: 98.1 F (36.7 C) 98 F (36.7 C)  SpO2: 100% 100%   Filed Weights   09/03/21 1546 09/04/21 0500 09/06/21 0500  Weight: 176 lb (79.8 kg) 197 lb 12 oz (89.7 kg) 210 lb 15.7 oz (95.7 kg)    Intake/Output from previous day: 08/25 0701 - 08/26 0700 In: 492.8 [P.O.:360; I.V.:13; IV Piggyback:119.8] Out: 100 [Urine:100]  Physical examination:  HEENT-no thrush, single petechiae at the tip of the tongue Cardiovascular-the rate and rhythm Lungs-clear bilaterally Abdomen-soft and nontender, no hepatosplenomegaly Vascular-trace pitting edema of the lower leg bilaterally  Left groin biopsy site without bleeding or evidence of infection LABORATORY DATA:  I have reviewed the data as listed    Latest Ref Rng & Units 09/05/2021    4:10 AM 09/04/2021    3:40 AM 09/03/2021   12:20 PM  CMP  Glucose 70 - 99 mg/dL 108  129  102   BUN 8 - 23 mg/dL 20  26  25    Creatinine 0.44 - 1.00 mg/dL 1.06  1.30  1.43   Sodium 135 - 145 mmol/L 140  140  140   Potassium 3.5 - 5.1 mmol/L 3.8  4.0  4.2   Chloride 98 -  111 mmol/L 114  114  113   CO2 22 - 32 mmol/L 21  22  20     Calcium 8.9 - 10.3 mg/dL 7.8  8.0  8.6   Total Protein 6.5 - 8.1 g/dL  4.3    Total Bilirubin 0.3 - 1.2 mg/dL  0.8    Alkaline Phos 38 - 126 U/L  73    AST 15 - 41 U/L  43    ALT 0 - 44 U/L  34      Lab Results  Component Value Date   WBC 1.4 (LL) 09/06/2021   HGB 6.8 (LL) 09/06/2021   HCT 22.4 (L) 09/06/2021   MCV 97.0 09/06/2021   PLT 64 (L) 09/06/2021   NEUTROABS 1.0 (L) 09/06/2021    No results found for: "CEA1", "CEA", "CAN199", "CA125", "PSA1"  VAS US CAROTID  Result Date: 09/04/2021 Carotid Arterial Duplex Study Patient Name:  [name] [name]  Date of Exam:   09/04/2021 Medical Rec #: [id number]      Accession #:    [id number] Date of Birth: [birth year]       Patient Gender: F Patient Age:   76 years Exam Location:  [facility] Procedure:      VAS US CAROTID Referring Phys: DAVID ORTIZ --------------------------------------------------------------------------------  Indications:       Syncope. Risk Factors:      Hypertension, hyperlipidemia. Limitations        Today's exam was limited due to the patient's respiratory                    variation. Comparison Study:  No prior studies. Performing Technologist: Oliver Hum RVT  Examination Guidelines: A complete evaluation includes B-mode imaging, spectral Doppler, color Doppler, and power Doppler as needed of all accessible portions of each vessel. Bilateral testing is considered an integral part of a complete examination. Limited examinations for reoccurring indications may be performed as noted.  Right Carotid Findings: +----------+--------+--------+--------+-----------------------+--------+           PSV cm/sEDV cm/sStenosisPlaque Description     Comments +----------+--------+--------+--------+-----------------------+--------+ CCA Prox  92      12                                     tortuous +----------+--------+--------+--------+-----------------------+--------+ CCA Distal73      14              smooth and  heterogenous         +----------+--------+--------+--------+-----------------------+--------+ ICA Prox  74      16              smooth and heterogenous         +----------+--------+--------+--------+-----------------------+--------+ ICA Distal65      14                                     tortuous +----------+--------+--------+--------+-----------------------+--------+ ECA       89      4                                               +----------+--------+--------+--------+-----------------------+--------+ +----------+--------+-------+--------+-------------------+           PSV cm/sEDV cmsDescribeArm Pressure (mmHG) +----------+--------+-------+--------+-------------------+ [id number]                                        +----------+--------+-------+--------+-------------------+ +---------+--------+--+--------+-+---------+  VertebralPSV cm/s57EDV cm/s8Antegrade +---------+--------+--+--------+-+---------+  Left Carotid Findings: +----------+-------+-------+--------+---------------------------------+--------+           PSV    EDV    StenosisPlaque Description               Comments           cm/s   cm/s                                                     +----------+-------+-------+--------+---------------------------------+--------+ CCA Prox  95     14                                                       +----------+-------+-------+--------+---------------------------------+--------+ CCA Distal77     14             smooth and heterogenous                   +----------+-------+-------+--------+---------------------------------+--------+ ICA Prox  86     19             calcific, irregular and          tortuous                                 heterogenous                              +----------+-------+-------+--------+---------------------------------+--------+ ICA Distal78     15                                               tortuous +----------+-------+-------+--------+---------------------------------+--------+ ECA       151    14                                                       +----------+-------+-------+--------+---------------------------------+--------+ +----------+--------+--------+--------+-------------------+           PSV cm/sEDV cm/sDescribeArm Pressure (mmHG) +----------+--------+--------+--------+-------------------+ [id number]                                         +----------+--------+--------+--------+-------------------+ +---------+--------+--+--------+--+---------+ VertebralPSV cm/s54EDV cm/s12Antegrade +---------+--------+--+--------+--+---------+   Summary: Right Carotid: Velocities in the right ICA are consistent with a 1-39% stenosis. Left Carotid: Velocities in the left ICA are consistent with a 1-39% stenosis. Vertebrals: Bilateral vertebral arteries demonstrate antegrade flow. *See table(s) above for measurements and observations.  Electronically signed by Monica Martinez MD on 09/04/2021 at 1:02:46 PM.    Final    ECHOCARDIOGRAM COMPLETE  Result Date: 09/04/2021    ECHOCARDIOGRAM REPORT   Patient Name:   CHASITIE PASSEY Date of Exam: 09/04/2021 Medical Rec #:  [id number]     Height:  67.0 in Accession #:    [id number]    Weight:       197.8 lb Date of Birth:  [birth year]      BSA:          2.013 m Patient Age:    30 years      BP:           115/51 mmHg Patient Gender: F             HR:           83 bpm. Exam Location:  Inpatient Procedure: 2D Echo, Cardiac Doppler and Color Doppler Indications:    Chemotherapy  History:        Patient has prior history of Echocardiogram examinations, most                 recent 08/09/2020. Risk Factors:Sleep Apnea, Diabetes and                 Hypertension.  Sonographer:    Jefferey Pica Referring Phys: [place]  1. Left ventricular ejection fraction, by estimation, is 65 to 70%. The left ventricle has normal  function. The left ventricle has no regional wall motion abnormalities. Left ventricular diastolic parameters were normal.  2. Right ventricular systolic function is normal. The right ventricular size is normal. There is moderately elevated pulmonary artery systolic pressure.  3. The mitral valve is degenerative. Mild mitral valve regurgitation. Moderate mitral annular calcification.  4. The aortic valve is tricuspid. There is mild calcification of the aortic valve. There is mild thickening of the aortic valve. Aortic valve regurgitation is not visualized. Aortic valve sclerosis is present, with no evidence of aortic valve stenosis.  5. The inferior vena cava is normal in size with greater than 50% respiratory variability, suggesting right atrial pressure of 3 mmHg. Comparison(s): No significant change from prior study. FINDINGS  Left Ventricle: Left ventricular ejection fraction, by estimation, is 65 to 70%. The left ventricle has normal function. The left ventricle has no regional wall motion abnormalities. The left ventricular internal cavity size was normal in size. There is  no left ventricular hypertrophy. Left ventricular diastolic parameters were normal. Right Ventricle: The right ventricular size is normal. No increase in right ventricular wall thickness. Right ventricular systolic function is normal. There is moderately elevated pulmonary artery systolic pressure. The tricuspid regurgitant velocity is 3.62 m/s, and with an assumed right atrial pressure of 5 mmHg, the estimated right ventricular systolic pressure is 60.1 mmHg. Left Atrium: Left atrial size was normal in size. Right Atrium: Right atrial size was normal in size. Pericardium: There is no evidence of pericardial effusion. Mitral Valve: The mitral valve is degenerative in appearance. There is mild thickening of the mitral valve leaflet(s). There is mild calcification of the mitral valve leaflet(s). Moderate mitral annular calcification. Mild  mitral valve regurgitation. Tricuspid Valve: The tricuspid valve is normal in structure. Tricuspid valve regurgitation is mild. Aortic Valve: The aortic valve is tricuspid. There is mild calcification of the aortic valve. There is mild thickening of the aortic valve. Aortic valve regurgitation is not visualized. Aortic valve sclerosis is present, with no evidence of aortic valve stenosis. Aortic valve peak gradient measures 12.1 mmHg. Pulmonic Valve: The pulmonic valve was normal in structure. Pulmonic valve regurgitation is trivial. Aorta: The aortic root and ascending aorta are structurally normal, with no evidence of dilitation. Venous: The inferior vena cava is normal in size with greater than 50% respiratory variability,  suggesting right atrial pressure of 3 mmHg. IAS/Shunts: The atrial septum is grossly normal.  LEFT VENTRICLE PLAX 2D LVIDd:         4.20 cm   Diastology LVIDs:         2.30 cm   LV e' medial:    7.88 cm/s LV PW:         1.10 cm   LV E/e' medial:  16.6 LV IVS:        1.00 cm   LV e' lateral:   9.08 cm/s LVOT diam:     1.80 cm   LV E/e' lateral: 14.4 LV SV:         88 LV SV Index:   43 LVOT Area:     2.54 cm  RIGHT VENTRICLE RV S prime:     19.20 cm/s TAPSE (M-mode): 2.6 cm LEFT ATRIUM             Index        RIGHT ATRIUM           Index LA diam:        4.50 cm 2.24 cm/m   RA Area:     17.30 cm LA Vol (A2C):   57.4 ml 28.52 ml/m  RA Volume:   45.00 ml  22.36 ml/m LA Vol (A4C):   54.0 ml 26.83 ml/m LA Biplane Vol: 57.5 ml 28.57 ml/m  AORTIC VALVE                  PULMONIC VALVE AV Area (Vmax): 2.33 cm      PV Vmax:       1.12 m/s AV Vmax:        174.00 cm/s   PV Peak grad:  5.0 mmHg AV Peak Grad:   12.1 mmHg LVOT Vmax:      159.00 cm/s LVOT Vmean:     108.000 cm/s LVOT VTI:       0.344 m  AORTA Ao Root diam: 3.10 cm Ao Asc diam:  3.40 cm MITRAL VALVE                TRICUSPID VALVE MV Area (PHT): 3.65 cm     TR Peak grad:   52.4 mmHg MV Decel Time: 208 msec     TR Vmax:        362.00 cm/s  MV E velocity: 131.00 cm/s MV A velocity: 38.60 cm/s   SHUNTS MV E/A ratio:  3.39         Systemic VTI:  0.34 m                             Systemic Diam: 1.80 cm Gwyndolyn Kaufman MD Electronically signed by Gwyndolyn Kaufman MD Signature Date/Time: 09/04/2021/11:09:03 AM    Final    [place] EKG SITE RITE  Result Date: 09/03/2021 If Site Rite image not attached, placement could not be confirmed due to current cardiac rhythm.  DG Chest Portable 1 View  Result Date: 09/03/2021 CLINICAL DATA:  Provided history: Hematemesis. Additional history provided: Weakness, recent diagnosis of lymphoma. EXAM: PORTABLE CHEST 1 VIEW COMPARISON:  Head CT 08/27/2021. Prior chest radiographs 04/20/2021 and earlier. FINDINGS: Heart size at the upper limits of normal. Minimal atelectasis within the left lung base. No appreciable airspace consolidation or pulmonary edema. No evidence of pleural effusion or pneumothorax. No acute bony abnormality identified. Dextrocurvature of the thoracic spine. IMPRESSION: Minimal atelectasis within the left lung base.  Otherwise, no evidence of acute cardiopulmonary abnormality. Electronically Signed   By: Kellie Simmering D.O.   On: 09/03/2021 12:20   NM PET Image Initial (PI) Skull Base To Thigh  Result Date: 08/29/2021 CLINICAL DATA:  Initial treatment strategy for progressive pancytopenia. Severe fatigue. Anorexia with weight loss. Night sweats. EXAM: NUCLEAR MEDICINE PET SKULL BASE TO THIGH TECHNIQUE: 9.1 mCi F-18 FDG was injected intravenously. Full-ring PET imaging was performed from the skull base to thigh after the radiotracer. CT data was obtained and used for attenuation correction and anatomic localization. Fasting blood glucose: 99 mg/dl COMPARISON:  02/17/2021 CT chest, abdomen and pelvis. FINDINGS: Mediastinal blood pool activity: SUV max 2.7 Liver activity: SUV max 4.3 NECK: Several small hypermetabolic bilateral neck lymph nodes. Representative left level 2 neck 0.5 cm node with max  SUV 10.5 (series 4/image 34). Representative 0.6 cm right level 3 neck node with max SUV 13.8 (series 4/image 38). Incidental CT findings: None. CHEST: Hypermetabolic left axillary lymphadenopathy with dominant 1.0 cm left axillary node with max SUV 23.6 (series 4/image 59). No enlarged or hypermetabolic right axillary nodes. Several mildly enlarged hypermetabolic bilateral mediastinal lymph nodes. Representative 1.1 cm right paratracheal node with max SUV 14.2 (series 4/image 65). Representative 1.0 cm subcarinal node with max SUV 29.5 (series 4/image 75). Hypermetabolic left hilar adenopathy with max SUV 7.4. No hypermetabolic right hilar adenopathy. Generalized hypermetabolism throughout the bilateral lung fields with associated faint ground-glass opacity in both lungs with representative max SUV 5.1 in the left lower lobe. Incidental CT findings: Coronary atherosclerosis. Atherosclerotic nonaneurysmal thoracic aorta. No significant pulmonary nodules. ABDOMEN/PELVIS: Widespread hypermetabolic lymphadenopathy in the porta hepatis, portacaval, aortocaval, left para-aortic, mesenteric, left common iliac, left external iliac and left inguinal nodal chains. Representative 1.3 cm porta hepatis node with max SUV 13.0 (series 5/image 115). Representative 1.3 cm aortocaval node with max SUV 25.7 (series 4/image 141). Representative 1.5 cm left para-aortic node with max SUV 26.7 (series 4/image 138). Representative 1.0 cm right mesenteric node with max SUV 21.2 (series 4/image 145). Representative 1.3 cm left external iliac node with max SUV 15.8 (series 4/image 172). Representative 1.0 cm left inguinal node with max SUV 15.6 (series 4/image 187). Mild-to-moderate splenomegaly. Craniocaudal splenic length 16.3 cm. Splenic FDG uptake is equivalent to the liver with max SUV 4.4. No focal splenic masses. No abnormal hypermetabolic activity within the liver, pancreas or adrenal glands. Incidental CT findings: Postsurgical  changes from fundoplication in the proximal stomach. Atherosclerotic nonaneurysmal abdominal aorta. Hysterectomy. SKELETON: Multiple hypermetabolic skeletal lesions in the axial and visualized proximal appendicular skeleton. Representative right T11 vertebral/posterior element lesion with max SUV 15.4. Representative proximal left humerus metaphysis lesion with max SUV 5.0. Representative left mandibular body lesion with max SUV 6.7. Incidental CT findings: None. IMPRESSION: 1. Widespread intensely hypermetabolic lymphadenopathy in the neck, left axilla, mediastinum, retroperitoneum and left pelvis. Mild-to-moderate splenomegaly. Scattered hypermetabolic skeletal lesions. Generalized bilateral pulmonary hypermetabolism with associated faint ground-glass opacity. Findings are most compatible with lymphoproliferative disorder/lymphoma. Deauville score 5. 2. Chronic findings include: Aortic Atherosclerosis (ICD10-I70.0). Coronary atherosclerosis. Electronically Signed   By: Ilona Sorrel M.D.   On: 08/29/2021 10:09   IR BONE MARROW BIOPSY & ASPIRATION  Result Date: 08/13/2021 INDICATION: pancytopenia, sweats, splenomegaly, anorexia EXAM: Bone marrow aspiration and core biopsy using fluoroscopic guidance MEDICATIONS: None. ANESTHESIA/SEDATION: Moderate (conscious) sedation was employed during this procedure. A total of Versed 2 mg and Fentanyl 100 mcg was administered intravenously. Moderate Sedation Time: 10 minutes. The patient's level of consciousness and vital  signs were monitored continuously by radiology nursing throughout the procedure under my direct supervision. FLUOROSCOPY TIME:  Fluoroscopy Time: 1.8 minutes (18 mGy) COMPLICATIONS: None immediate. PROCEDURE: Informed written consent was obtained from the patient after a thorough discussion of the procedural risks, benefits and alternatives. All questions were addressed. Maximal Sterile Barrier Technique was utilized including caps, mask, sterile gowns,  sterile gloves, sterile drape, hand hygiene and skin antiseptic. A timeout was performed prior to the initiation of the procedure. The patient was placed prone on the IR exam table. Limited fluoroscopy of the pelvis was performed for planning purposes. Skin entry site was marked, and the overlying skin was prepped and draped in the standard sterile fashion. Local analgesia was obtained with 1% lidocaine. Using fluoroscopic guidance, an 11 gauge needle was advanced just deep to the cortex of the right posterior ilium. Subsequently, bone marrow aspiration and core biopsy were performed. Specimens were submitted to lab/pathology for handling. Hemostasis was achieved with manual pressure, and a clean dressing was placed. The patient tolerated the procedure well without immediate complication. IMPRESSION: Successful bone marrow aspiration and core biopsy of the right posterior ilium using fluoroscopic guidance. Electronically Signed   By: Albin Felling M.D.   On: 08/13/2021 14:00     Future Appointments  Date Time Provider [place]  09/22/2021 12:00 PM [facility]  10/09/2021  9:00 AM Jerline Pain, MD [facility]      LOS: 2 days

## 2021-09-06 NOTE — Progress Notes (Signed)
Procedures

## 2021-09-07 ENCOUNTER — Encounter (HOSPITAL_COMMUNITY): Payer: Self-pay | Admitting: Internal Medicine

## 2021-09-07 DIAGNOSIS — R55 Syncope and collapse: Secondary | ICD-10-CM | POA: Diagnosis not present

## 2021-09-07 DIAGNOSIS — C8598 Non-Hodgkin lymphoma, unspecified, lymph nodes of multiple sites: Secondary | ICD-10-CM | POA: Diagnosis not present

## 2021-09-07 DIAGNOSIS — D61818 Other pancytopenia: Secondary | ICD-10-CM | POA: Diagnosis not present

## 2021-09-07 LAB — BPAM RBC
Blood Product Expiration Date: 202309022359
Blood Product Expiration Date: 202309162359
ISSUE DATE / TIME: 202308261357
ISSUE DATE / TIME: 202308261719
Unit Type and Rh: 600
Unit Type and Rh: 6200

## 2021-09-07 LAB — CBC WITH DIFFERENTIAL/PLATELET
Abs Immature Granulocytes: 0.2 10*3/uL — ABNORMAL HIGH (ref 0.00–0.07)
Basophils Absolute: 0 10*3/uL (ref 0.0–0.1)
Basophils Relative: 0 %
Eosinophils Absolute: 0 10*3/uL (ref 0.0–0.5)
Eosinophils Relative: 0 %
HCT: 26.9 % — ABNORMAL LOW (ref 36.0–46.0)
Hemoglobin: 8.5 g/dL — ABNORMAL LOW (ref 12.0–15.0)
Immature Granulocytes: 4 %
Lymphocytes Relative: 5 %
Lymphs Abs: 0.2 10*3/uL — ABNORMAL LOW (ref 0.7–4.0)
MCH: 29.8 pg (ref 26.0–34.0)
MCHC: 31.6 g/dL (ref 30.0–36.0)
MCV: 94.4 fL (ref 80.0–100.0)
Monocytes Absolute: 0.2 10*3/uL (ref 0.1–1.0)
Monocytes Relative: 5 %
Neutro Abs: 3.9 10*3/uL (ref 1.7–7.7)
Neutrophils Relative %: 86 %
Platelets: 78 10*3/uL — ABNORMAL LOW (ref 150–400)
RBC: 2.85 MIL/uL — ABNORMAL LOW (ref 3.87–5.11)
RDW: 17.4 % — ABNORMAL HIGH (ref 11.5–15.5)
WBC: 4.5 10*3/uL (ref 4.0–10.5)
nRBC: 0 % (ref 0.0–0.2)

## 2021-09-07 LAB — TYPE AND SCREEN
ABO/RH(D): A POS
Antibody Screen: NEGATIVE
Unit division: 0
Unit division: 0

## 2021-09-07 LAB — PHOSPHORUS: Phosphorus: 4.1 mg/dL (ref 2.5–4.6)

## 2021-09-07 LAB — BASIC METABOLIC PANEL
Anion gap: 4 — ABNORMAL LOW (ref 5–15)
BUN: 27 mg/dL — ABNORMAL HIGH (ref 8–23)
CO2: 21 mmol/L — ABNORMAL LOW (ref 22–32)
Calcium: 8.3 mg/dL — ABNORMAL LOW (ref 8.9–10.3)
Chloride: 117 mmol/L — ABNORMAL HIGH (ref 98–111)
Creatinine, Ser: 1.03 mg/dL — ABNORMAL HIGH (ref 0.44–1.00)
GFR, Estimated: 57 mL/min — ABNORMAL LOW (ref >60)
Glucose, Bld: 120 mg/dL — ABNORMAL HIGH (ref 70–99)
Potassium: 3.9 mmol/L (ref 3.5–5.1)
Sodium: 142 mmol/L (ref 135–145)

## 2021-09-07 LAB — URIC ACID: Uric Acid, Serum: 4.5 mg/dL (ref 2.5–7.1)

## 2021-09-07 LAB — LACTATE DEHYDROGENASE: LDH: 929 U/L — ABNORMAL HIGH (ref 98–192)

## 2021-09-07 LAB — T4, FREE: Free T4: 0.88 ng/dL (ref 0.61–1.12)

## 2021-09-07 MED ORDER — LEVOTHYROXINE SODIUM 50 MCG PO TABS
75.0000 ug | ORAL_TABLET | Freq: Every day | ORAL | Status: DC
  Administered 2021-09-08 – 2021-09-10 (×3): 75 ug via ORAL
  Filled 2021-09-07 (×3): qty 1

## 2021-09-07 MED ORDER — FUROSEMIDE 20 MG PO TABS
20.0000 mg | ORAL_TABLET | Freq: Every day | ORAL | Status: DC
  Administered 2021-09-07 – 2021-09-08 (×2): 20 mg via ORAL
  Filled 2021-09-07 (×2): qty 1

## 2021-09-07 MED ORDER — BISACODYL 5 MG PO TBEC: 10.0000 mg | DELAYED_RELEASE_TABLET | Freq: Once | ORAL | Status: DC

## 2021-09-07 MED ORDER — POLYETHYLENE GLYCOL 3350 17 G PO PACK
17.0000 g | PACK | Freq: Every day | ORAL | Status: DC
  Administered 2021-09-08: 17 g via ORAL
  Filled 2021-09-07 (×2): qty 1

## 2021-09-07 NOTE — Evaluation (Signed)
Physical Therapy Evaluation Patient Details Name: [name] [name] MRN: [id number] DOB: May 25, 1945 Today's Date: 09/07/2021  History of Present Illness  Pt admitted from home 2* weakness, syncope and hemoptysis. Pt with recent dx of Non-Hodgkins Lymphoma and hx of L TKR , back surgery, afib, CKD  Clinical Impression  Pt admitted as above and presenting with functional mobility limitations 2* generalized weakness, balance deficits and poor endurance.  Pt should progress to dc home with family assist and would benefit from follow up HHPT to maximize IND and safety and regain PLOF.     Recommendations for follow up therapy are one component of a multi-disciplinary discharge planning process, led by the attending physician.  Recommendations may be updated based on patient status, additional functional criteria and insurance authorization.  Follow Up Recommendations Home health PT      Assistance Recommended at Discharge Intermittent Supervision/Assistance  Patient can return home with the following  A little help with walking and/or transfers;A little help with bathing/dressing/bathroom;Assistance with cooking/housework;Assist for transportation;Help with stairs or ramp for entrance    Equipment Recommendations None recommended by PT  Recommendations for Other Services       Functional Status Assessment Patient has had a recent decline in their functional status and demonstrates the ability to make significant improvements in function in a reasonable and predictable amount of time.     Precautions / Restrictions Precautions Precautions: Fall Restrictions Weight Bearing Restrictions: No      Mobility  Bed Mobility               General bed mobility comments: Pt up in chair and requests back to same    Transfers Overall transfer level: Needs assistance Equipment used: Rolling Shon (2 wheels) Transfers: Sit to/from Stand Sit to Stand: Min assist           General  transfer comment: steady assist    Ambulation/Gait Ambulation/Gait assistance: Min assist, Min guard Gait Distance (Feet): 58 Feet Assistive device: Rolling [place] (2 wheels) Gait Pattern/deviations: Step-through pattern, Decreased step length - right, Decreased step length - left, Shuffle, Trunk flexed Gait velocity: decreased     General Gait Details: Steady assist with cues for posture and position from RW; distance ltd by fatigue  Stairs            Wheelchair Mobility    Modified Rankin (Stroke Patients Only)       Balance Overall balance assessment: Needs assistance Sitting-balance support: No upper extremity supported Sitting balance-Leahy Scale: Good     Standing balance support: Bilateral upper extremity supported, No upper extremity supported Standing balance-Leahy Scale: Poor                               Pertinent Vitals/Pain Pain Assessment Pain Assessment: No/denies pain    Home Living Family/patient expects to be discharged to:: Private residence Living Arrangements: Spouse/significant other Available Help at Discharge: Family;Available 24 hours/day Type of Home: House Home Access: Ramped entrance       Home Layout: Two level;Able to live on main level with bedroom/bathroom Home Equipment: Rolling [place] (2 wheels);Cane - single point;Shower seat;BSC/3in1;Rollator (4 wheels)      Prior Function Prior Level of Function : Needs assist             Mobility Comments: Recently using cane or rollator and family has been helping her up ramp sitting in rollator       Hand Dominance  Extremity/Trunk Assessment   Upper Extremity Assessment Upper Extremity Assessment: Generalized weakness    Lower Extremity Assessment Lower Extremity Assessment: Generalized weakness    Cervical / Trunk Assessment Cervical / Trunk Assessment: Normal  Communication   Communication: No difficulties  Cognition Arousal/Alertness:  Awake/alert Behavior During Therapy: WFL for tasks assessed/performed Overall Cognitive Status: Within Functional Limits for tasks assessed                                          General Comments      Exercises     Assessment/Plan    PT Assessment Patient needs continued PT services  PT Problem List         PT Treatment Interventions      PT Goals (Current goals can be found in the Care Plan section)  Acute Rehab PT Goals Patient Stated Goal: Regain strength and IND PT Goal Formulation: With patient/family Time For Goal Achievement: 09/13/21 Potential to Achieve Goals: Good    Frequency       Co-evaluation               AM-PAC PT "6 Clicks" Mobility  Outcome Measure                  End of Session Equipment Utilized During Treatment: Gait belt Activity Tolerance: Patient limited by fatigue Patient left: in chair;with call bell/phone within reach;with family/visitor present Nurse Communication: Mobility status PT Visit Diagnosis: Other abnormalities of gait and mobility (R26.89);Muscle weakness (generalized) (M62.81);Difficulty in walking, not elsewhere classified (R26.2)    Time: 0762-2633 PT Time Calculation (min) (ACUTE ONLY): 14 min   Charges:   PT Evaluation $PT Eval Low Complexity: 1 Low          [place] Pager [phone] Office [phone]   Akiera Allbaugh 09/07/2021, 12:41 PM

## 2021-09-07 NOTE — Consult Note (Signed)
Chief Complaint: Patient was seen in consultation today for port-a- catheter placement Chief Complaint  Patient presents with   Weakness   at the request of [name] [name],[name] [name]  Referring Physician(s): [name] [name]  Supervising Physician: [name] [name]  Patient Status: [facility] - In-pt  History of Present Illness: [name] [name] is a 76 y.o. female with PMHs of A-fib, stage III CKD, anxiety and depression, GERD, HLD, HTN, prediabetes, OSA on no CPAP, hypothyroidism, recent diagnosis of large [name] cell lymphoma who presents for port-a-catheter placement.   Patient is known to IR service for BMBx in 2022 and on 08/13/21, BMBx on 08/13/21 showed large [name] cell lymphoma.   She presnted to ED on 8/23 due to syncopal episode and weakness, oncology was consulted and patient was started on chemotherapy on 8/25. She is in need of long term CVC for chemotherapy, IR was requested for [facility] placement.   Patient sitting in a chair, not in acute distress.  [name] [name] headache, fever, chills, shortness of breath, cough, chest pain, abdominal pain, nausea ,vomiting, and bleeding. She is hoping to get [facility] as soon as possible.    Past Medical History:  Diagnosis Date   A-fib ([place])    paroxysmal   Anxiety    Arthritis    Cataracts, bilateral    immature   CKD (chronic kidney disease) stage 3, GFR 30-59 ml/min (HCC)    Depression    GERD (gastroesophageal reflux disease)    was on Nexium    GERD (gastroesophageal reflux disease)    Hemorrhoid    History of bronchitis    couple of yrs ago   History of colon polyps    History of peristent atrial fibrillation    Hyperlipidemia    takes Pravastatin daily   Hypertension    takes Amlodipine and Diovan daily   Hypertension    Pre-diabetes    Sleep apnea    no cpap used   Sleep apnea    study done 20+yrs ago and doesn't use cpap   Urinary urgency    Weakness    in left leg r/t back    Past Surgical History:  Procedure Laterality Date   ABDOMINAL  HYSTERECTOMY  1980   partial   ABDOMINAL HYSTERECTOMY     partial   BACK SURGERY     CARDIOVERSION N/A 02/24/2016   Procedure: CARDIOVERSION;  Surgeon: Fay Records, MD;  Location: [place];  Service: Cardiovascular;  Laterality: N/A;   CARDIOVERSION N/A 03/19/2016   Procedure: CARDIOVERSION;  Surgeon: Fay Records, MD;  Location: [facility] ENDOSCOPY;  Service: Cardiovascular;  Laterality: N/A;   CARDIOVERSION N/A 05/15/2021   Procedure: CARDIOVERSION;  Surgeon: Pixie Casino, MD;  Location: [place] ENDOSCOPY;  Service: Cardiovascular;  Laterality: N/A;   [place]     COLONOSCOPY     COLONOSCOPY WITH PROPOFOL  12/29/2011   Procedure: COLONOSCOPY WITH PROPOFOL;  Surgeon: Lear Ng, MD;  Location: [facility] ENDOSCOPY;  Service: Endoscopy;  Laterality: N/A;   COLONOSCOPY WITH PROPOFOL N/A 05/24/2014   Procedure: COLONOSCOPY WITH PROPOFOL;  Surgeon: Wilford Corner, MD;  Location: [facility] ENDOSCOPY;  Service: Endoscopy;  Laterality: N/A;   ESOPHAGEAL MANOMETRY N/A 02/22/2017   Procedure: ESOPHAGEAL MANOMETRY (EM);  Surgeon: Wilford Corner, MD;  Location: [facility] ENDOSCOPY;  Service: Endoscopy;  Laterality: N/A;   ESOPHAGOGASTRODUODENOSCOPY     FOOT SURGERY     left, right   FRACTURE SURGERY  left leg-knee   HEEL SPUR EXCISION Bilateral    HOT HEMOSTASIS  12/29/2011   Procedure: HOT HEMOSTASIS (ARGON PLASMA COAGULATION/BICAP);  Surgeon: Lear Ng, MD;  Location: [facility] END[facility];  Service: Endoscopy;  Laterality: N/A;   HOT HEMOSTASIS N/A 05/24/2014   Procedure: HOT HEMOSTASIS (ARGON PLASMA COAGULATION/BICAP);  Surgeon: Wilford Corner, MD;  Location: [facility] ENDOSCOPY;  Service: Endoscopy;  Laterality: N/A;   IR BONE MARROW BIOPSY & ASPIRATION  08/13/2021   leg surgery d/t break Left    LUMBAR LAMINECTOMY/DECOMPRESSION MICRODISCECTOMY Left 04/05/2013   Procedure: LUMBAR FOUR TO FIVE LUMBAR LAMINECTOMY/DECOMPRESSION MICRODISCECTOMY 1 LEVEL;   Surgeon: Eustace Moore, MD;  Location: [place] NEURO ORS;  Service: Neurosurgery;  Laterality: Left;   LYMPH NODE BIOPSY Left 08/30/2021   Procedure: LYMPH NODE BIOPSY;  Surgeon: Jovita Kussmaul, MD;  Location: [facility] ORS;  Service: General;  Laterality: Left;   NISSEN FUNDOPLICATION     [place] IMPEDANCE STUDY N/A 02/22/2017   Procedure: [place] IMPEDANCE STUDY;  Surgeon: Wilford Corner, MD;  Location: [facility] ENDOSCOPY;  Service: Endoscopy;  Laterality: N/A;   TEE WITHOUT CARDIOVERSION N/A 02/24/2016   Procedure: TRANSESOPHAGEAL ECHOCARDIOGRAM (TEE);  Surgeon: Fay Records, MD;  Location: [place];  Service: Cardiovascular;  Laterality: N/A;   TOTAL KNEE ARTHROPLASTY Left 10/07/2016   TOTAL KNEE ARTHROPLASTY Left 10/07/2016   Procedure: TOTAL KNEE ARTHROPLASTY;  Surgeon: [name], MD;  Location: [place];  Service: Orthopedics;  Laterality: Left;    Allergies: Omnicef [cefdinir], Rituxan [rituximab], Bacitracin, Bacitracin-polymyxin [name], Duloxetine hcl, Lexapro [escitalopram], Mirtazapine, Penicillins, Peroxide [hydrogen peroxide], Zoloft [sertraline hcl], and Neosporin [neomycin-bacitracin zn-polymyx]  Medications: Prior to Admission medications   Medication Sig Start Date End Date Taking? Authorizing Provider  acetaminophen (TYLENOL) 500 MG tablet Take 1,000 mg by mouth every 6 (six) hours as needed for moderate pain.   Yes [provider]  albuterol (VENTOLIN HFA) 108 (90 Base) MCG/ACT inhaler Inhale 1-2 puffs into the lungs every 4 (four) hours as needed for wheezing or shortness of breath. 07/22/18  Yes Parrett, Fonnie Mu, NP  ALPRAZolam (XANAX) 0.5 MG tablet Take 1 tablet (0.5 mg total) by mouth 3 (three) times daily as needed for anxiety. 03/11/21  Yes Owens Shark, NP  amiodarone (PACERONE) 200 MG tablet Taking one tablet by mouth daily Patient taking differently: Take 200 mg by mouth daily. 05/22/21  Yes Fenton, Clint R, PA  apixaban (ELIQUIS) 5 MG TABS tablet Take 1 tablet (5 mg total) by  mouth 2 (two) times daily. 03/27/21 03/22/22 Yes Loel Dubonnet, NP  benzonatate (TESSALON) 200 MG capsule Take 1 capsule by mouth three times daily as needed for cough Patient taking differently: Take 200 mg by mouth daily as needed for cough. 05/22/19  Yes Parrett, Tammy S, NP  buPROPion (WELLBUTRIN XL) 300 MG 24 hr tablet Take 150 mg by mouth daily. 08/02/21  Yes [provider]  Coenzyme Q10 (CO Q-10) 100 MG CAPS Take 100 mg by mouth daily.   Yes [provider]  famotidine (PEPCID) 20 MG tablet Take 20 mg by mouth daily as needed for heartburn or indigestion.   Yes [provider]  fexofenadine (ALLEGRA) 180 MG tablet Take 180 mg by mouth daily.   Yes [provider]  furosemide (LASIX) 20 MG tablet Take 1 tablet (20 mg total) by mouth daily as needed for edema or fluid. 09/01/21  Yes Caren Griffins, MD  levothyroxine (SYNTHROID) 75 MCG tablet TAKE 1 TABLET DAILY BEFORE BREAKFAST (  NEED APPOINTMENT FOR FURTHER REFILLS) Patient taking differently: Take 75 mcg by mouth daily before breakfast. 09/01/21  Yes Shamleffer, Melanie Crazier, MD  Magnesium 250 MG TABS Take 250 mg by mouth daily.   Yes [provider]  metoprolol succinate (TOPROL-XL) 25 MG 24 hr tablet Take 1 tablet (25 mg total) by mouth daily. Patient taking differently: Take 25 mg by mouth at bedtime. 06/20/21  Yes Jerline Pain, MD  mirtazapine (REMERON) 30 MG tablet Take 30 mg by mouth at bedtime. 06/13/21  Yes [provider]  potassium chloride SA (KLOR-CON M) 20 MEQ tablet Take 1 tablet (20 mEq total) by mouth daily as needed (Take potassium only when you take the Furosemide (Lasix)). 09/01/21  Yes Caren Griffins, MD  pravastatin (PRAVACHOL) 40 MG tablet Take 40 mg by mouth at bedtime.   Yes [provider]  predniSONE (DELTASONE) 20 MG tablet Take 2 tablets (40 mg total) by mouth daily with breakfast. 09/02/21  Yes Gherghe, Vella Redhead, MD  sodium chloride (OCEAN) 0.65 %  SOLN nasal spray Place 1 spray into both nostrils daily as needed for congestion.   Yes [provider]  Spacer/Aero-Holding Chambers (AEROCHAMBER MV) inhaler Use as instructed 10/13/18   Baird Lyons D, MD  Vilazodone HCl 20 MG TABS Take 20 mg by mouth daily. Patient not taking: Reported on 09/03/2021    [provider]     Family History  Problem Relation Age of Onset   Colon cancer Mother    Heart disease Father    Breast cancer Neg Hx    Thyroid disease Neg Hx     Social History   Socioeconomic History   Marital status: Married    Spouse name: [name] [name]   Number of children: 2   Years of education: 12   Highest education level: Some college, no degree  Occupational History   Occupation: retired  Tobacco Use   Smoking status: Never   Smokeless tobacco: Never   Tobacco comments:    Never smoke 05/22/21  Vaping Use   Vaping Use: Never used  Substance and Sexual Activity   Alcohol use: No   Drug use: No   Sexual activity: Yes    Birth control/protection: Surgical  Other Topics Concern   Not on file  Social History Narrative   Right handed   Drinks caffeine   Two story home   Social Determinants of Health   Financial Resource Strain: Low Risk  (11/25/2016)   Overall Financial Resource Strain (CARDIA)    Difficulty of Paying Living Expenses: Not very hard  Food Insecurity: Unknown (11/25/2016)   Hunger Vital Sign    Worried About Running Out of Food in the Last Year: Patient refused    [place] in the Last Year: Patient refused  Transportation Needs: Unknown (11/25/2016)   PRAPARE - Transportation    Lack of Transportation (Medical): Patient refused    Lack of Transportation (Non-Medical): Patient refused  Physical Activity: Unknown (11/25/2016)   Exercise Vital Sign    Days of Exercise per Week: Patient refused    Minutes of Exercise per Session: Patient refused  Stress: Stress Concern Present (11/25/2016)   [place]    Feeling of Stress : Very much  Social Connections: Moderately Integrated (11/25/2016)   Social Connection and Isolation Panel [NHANES]    Frequency of Communication with Friends and Family: Twice a week    Frequency of Social Gatherings  with Friends and Family: Twice a week    Attends Religious Services: More than 4 times per year    Active Member of Clubs or Organizations: No    Attends Archivist Meetings: Never    Marital Status: Married     Review of Systems: A 12 point ROS discussed and pertinent positives are indicated in the HPI above.  All other systems are negative.  Vital Signs: BP 124/63 (BP Location: Left Arm)   Pulse (!) 53   Temp (!) 97.4 F (36.3 C) (Oral)   Resp 16   Ht 5' 7"  (1.702 m)   Wt 203 lb 14.8 oz (92.5 kg)   SpO2 100%   BMI 31.94 kg/m   Physical Exam Vitals and nursing note reviewed.  Constitutional:      General: Patient is not in acute distress.    Appearance: Normal appearance. Patient is not ill-appearing.  HENT:     Head: Normocephalic and atraumatic.     Mouth/Throat:     Mouth: Mucous membranes are moist.     Pharynx: Oropharynx is clear.  Cardiovascular:     Rate and Rhythm: Normal rate and regular rhythm.     Pulses: Normal pulses.     Heart sounds: Normal heart sounds.  Pulmonary:     Effort: Pulmonary effort is normal.     Breath sounds: Normal breath sounds.  Abdominal:     General: Abdomen is flat. Bowel sounds are normal.     Palpations: Abdomen is soft.  Musculoskeletal:     Cervical back: Neck supple. PICC on right arm  Skin:    General: Skin is warm and dry.     Coloration: Skin is not jaundiced or pale.  Neurological:     Mental Status: Patient is alert and oriented to person, place, and time.  Psychiatric:        Mood and Affect: Mood normal.        Behavior: Behavior normal.        Judgment: Judgment normal.     MD Evaluation Airway:  WNL Heart: WNL Abdomen: WNL Chest/ Lungs: WNL ASA  Classification: 3 Mallampati/Airway Score: Two  Imaging: VAS US CAROTID  Result Date: 09/04/2021 Carotid Arterial Duplex Study Patient Name:  [name] [name]  Date of Exam:   09/04/2021 Medical Rec #: [id number]      Accession #:    [id number] Date of Birth: [birth year]       Patient Gender: F Patient Age:   47 years Exam Location:  [facility] Procedure:      VAS US CAROTID Referring Phys: [name] [name] --------------------------------------------------------------------------------  Indications:       Syncope. Risk Factors:      Hypertension, hyperlipidemia. Limitations        Today's exam was limited due to the patient's respiratory                    variation. Comparison Study:  No prior studies. Performing Technologist: [name] [name] RVT  Examination Guidelines: A complete evaluation includes [name]-mode imaging, spectral Doppler, color Doppler, and power Doppler as needed of all accessible portions of each vessel. Bilateral testing is considered an integral part of a complete examination. Limited examinations for reoccurring indications may be performed as noted.  Right Carotid Findings: +----------+--------+--------+--------+-----------------------+--------+           PSV cm/sEDV cm/sStenosisPlaque Description     Comments +----------+--------+--------+--------+-----------------------+--------+ CCA Prox  92      12  tortuous +----------+--------+--------+--------+-----------------------+--------+ CCA Distal73      14              smooth and heterogenous         +----------+--------+--------+--------+-----------------------+--------+ ICA Prox  74      16              smooth and heterogenous         +----------+--------+--------+--------+-----------------------+--------+ ICA Distal65      14                                     tortuous  +----------+--------+--------+--------+-----------------------+--------+ ECA       89      4                                               +----------+--------+--------+--------+-----------------------+--------+ +----------+--------+-------+--------+-------------------+           PSV cm/sEDV cmsDescribeArm Pressure (mmHG) +----------+--------+-------+--------+-------------------+ [id number]                                        +----------+--------+-------+--------+-------------------+ +---------+--------+--+--------+-+---------+ VertebralPSV cm/s57EDV cm/s8Antegrade +---------+--------+--+--------+-+---------+  Left Carotid Findings: +----------+-------+-------+--------+---------------------------------+--------+           PSV    EDV    StenosisPlaque Description               Comments           cm/s   cm/s                                                     +----------+-------+-------+--------+---------------------------------+--------+ CCA Prox  95     14                                                       +----------+-------+-------+--------+---------------------------------+--------+ CCA Distal77     14             smooth and heterogenous                   +----------+-------+-------+--------+---------------------------------+--------+ ICA Prox  86     19             calcific, irregular and          tortuous                                 heterogenous                              +----------+-------+-------+--------+---------------------------------+--------+ ICA Distal78     15  tortuous +----------+-------+-------+--------+---------------------------------+--------+ ECA       151    14                                                       +----------+-------+-------+--------+---------------------------------+--------+ +----------+--------+--------+--------+-------------------+            PSV cm/sEDV cm/sDescribeArm Pressure (mmHG) +----------+--------+--------+--------+-------------------+ [id number]                                         +----------+--------+--------+--------+-------------------+ +---------+--------+--+--------+--+---------+ VertebralPSV cm/s54EDV cm/s12Antegrade +---------+--------+--+--------+--+---------+   Summary: Right Carotid: Velocities in the right ICA are consistent with a 1-39% stenosis. Left Carotid: Velocities in the left ICA are consistent with a 1-39% stenosis. Vertebrals: Bilateral vertebral arteries demonstrate antegrade flow. *See table(s) above for measurements and observations.  Electronically signed by [name] Martinez MD on 09/04/2021 at 1:02:46 PM.    Final    ECHOCARDIOGRAM COMPLETE  Result Date: 09/04/2021    ECHOCARDIOGRAM REPORT   Patient Name:   [name] [name] Date of Exam: 09/04/2021 Medical Rec #:  [id number]     Height:       67.0 in Accession #:    [id number]    Weight:       197.8 lb Date of Birth:  April 07, 1945      BSA:          2.013 m Patient Age:    18 years      BP:           115/51 mmHg Patient Gender: F             HR:           83 bpm. Exam Location:  Inpatient Procedure: 2D Echo, Cardiac Doppler and Color Doppler Indications:    Chemotherapy  History:        Patient has prior history of Echocardiogram examinations, most                 recent 08/09/2020. Risk Factors:Sleep Apnea, Diabetes and                 Hypertension.  Sonographer:    [name] [name] Referring Phys: [name] [name]  1. Left ventricular ejection fraction, by estimation, is 65 to 70%. The left ventricle has normal function. The left ventricle has no regional wall motion abnormalities. Left ventricular diastolic parameters were normal.  2. Right ventricular systolic function is normal. The right ventricular size is normal. There is moderately elevated pulmonary artery systolic pressure.  3. The mitral valve is degenerative.  Mild mitral valve regurgitation. Moderate mitral annular calcification.  4. The aortic valve is tricuspid. There is mild calcification of the aortic valve. There is mild thickening of the aortic valve. Aortic valve regurgitation is not visualized. Aortic valve sclerosis is present, with no evidence of aortic valve stenosis.  5. The inferior vena cava is normal in size with greater than 50% respiratory variability, suggesting right atrial pressure of 3 mmHg. Comparison(s): No significant change from prior study. FINDINGS  Left Ventricle: Left ventricular ejection fraction, by estimation, is 65 to 70%. The left ventricle has normal function. The left ventricle has no regional wall motion abnormalities. The left ventricular internal cavity size was normal in size.  There is  no left ventricular hypertrophy. Left ventricular diastolic parameters were normal. Right Ventricle: The right ventricular size is normal. No increase in right ventricular wall thickness. Right ventricular systolic function is normal. There is moderately elevated pulmonary artery systolic pressure. The tricuspid regurgitant velocity is 3.62 m/s, and with an assumed right atrial pressure of 5 mmHg, the estimated right ventricular systolic pressure is 66.2 mmHg. Left Atrium: Left atrial size was normal in size. Right Atrium: Right atrial size was normal in size. Pericardium: There is no evidence of pericardial effusion. Mitral Valve: The mitral valve is degenerative in appearance. There is mild thickening of the mitral valve leaflet(s). There is mild calcification of the mitral valve leaflet(s). Moderate mitral annular calcification. Mild mitral valve regurgitation. Tricuspid Valve: The tricuspid valve is normal in structure. Tricuspid valve regurgitation is mild. Aortic Valve: The aortic valve is tricuspid. There is mild calcification of the aortic valve. There is mild thickening of the aortic valve. Aortic valve regurgitation is not visualized.  Aortic valve sclerosis is present, with no evidence of aortic valve stenosis. Aortic valve peak gradient measures 12.1 mmHg. Pulmonic Valve: The pulmonic valve was normal in structure. Pulmonic valve regurgitation is trivial. Aorta: The aortic root and ascending aorta are structurally normal, with no evidence of dilitation. Venous: The inferior vena cava is normal in size with greater than 50% respiratory variability, suggesting right atrial pressure of 3 mmHg. IAS/Shunts: The atrial septum is grossly normal.  LEFT VENTRICLE PLAX 2D LVIDd:         4.20 cm   Diastology LVIDs:         2.30 cm   LV e' medial:    7.88 cm/s LV PW:         1.10 cm   LV E/e' medial:  16.6 LV IVS:        1.00 cm   LV e' lateral:   9.08 cm/s LVOT diam:     1.80 cm   LV E/e' lateral: 14.4 LV SV:         88 LV SV Index:   43 LVOT Area:     2.54 cm  RIGHT VENTRICLE RV S prime:     19.20 cm/s TAPSE (M-mode): 2.6 cm LEFT ATRIUM             Index        RIGHT ATRIUM           Index LA diam:        4.50 cm 2.24 cm/m   RA Area:     17.30 cm LA Vol (A2C):   57.4 ml 28.52 ml/m  RA Volume:   45.00 ml  22.36 ml/m LA Vol (A4C):   54.0 ml 26.83 ml/m LA Biplane Vol: 57.5 ml 28.57 ml/m  AORTIC VALVE                  PULMONIC VALVE AV Area (Vmax): 2.33 cm      PV Vmax:       1.12 m/s AV Vmax:        174.00 cm/s   PV Peak grad:  5.0 mmHg AV Peak Grad:   12.1 mmHg LVOT Vmax:      159.00 cm/s LVOT Vmean:     108.000 cm/s LVOT VTI:       0.344 m  AORTA Ao Root diam: 3.10 cm Ao Asc diam:  3.40 cm MITRAL VALVE  TRICUSPID VALVE MV Area (PHT): 3.65 cm     TR Peak grad:   52.4 mmHg MV Decel Time: 208 msec     TR Vmax:        362.00 cm/s MV E velocity: 131.00 cm/s MV A velocity: 38.60 cm/s   SHUNTS MV E/A ratio:  3.39         Systemic VTI:  0.34 m                             Systemic Diam: 1.80 cm [name] Kaufman MD Electronically signed by [name] Kaufman MD Signature Date/Time: 09/04/2021/11:09:03 AM    Final    [place] EKG SITE RITE  Result  Date: 09/03/2021 If Site Rite image not attached, placement could not be confirmed due to current cardiac rhythm.  DG Chest Portable 1 View  Result Date: 09/03/2021 CLINICAL DATA:  Provided history: Hematemesis. Additional history provided: Weakness, recent diagnosis of lymphoma. EXAM: PORTABLE CHEST 1 VIEW COMPARISON:  Head CT 08/27/2021. Prior chest radiographs 04/20/2021 and earlier. FINDINGS: Heart size at the upper limits of normal. Minimal atelectasis within the left lung base. No appreciable airspace consolidation or pulmonary edema. No evidence of pleural effusion or pneumothorax. No acute bony abnormality identified. Dextrocurvature of the thoracic spine. IMPRESSION: Minimal atelectasis within the left lung base. Otherwise, no evidence of acute cardiopulmonary abnormality. Electronically Signed   By: [name] [name] D.O.   On: 09/03/2021 12:20   NM PET Image Initial (PI) Skull Base To Thigh  Result Date: 08/29/2021 CLINICAL DATA:  Initial treatment strategy for progressive pancytopenia. Severe fatigue. Anorexia with weight loss. Night sweats. EXAM: NUCLEAR MEDICINE PET SKULL BASE TO THIGH TECHNIQUE: 9.1 mCi F-18 FDG was injected intravenously. Full-ring PET imaging was performed from the skull base to thigh after the radiotracer. CT data was obtained and used for attenuation correction and anatomic localization. Fasting blood glucose: 99 mg/dl COMPARISON:  02/17/2021 CT chest, abdomen and pelvis. FINDINGS: Mediastinal blood pool activity: SUV max 2.7 Liver activity: SUV max 4.3 NECK: Several small hypermetabolic bilateral neck lymph nodes. Representative left level 2 neck 0.5 cm node with max SUV 10.5 (series 4/image 34). Representative 0.6 cm right level 3 neck node with max SUV 13.8 (series 4/image 38). Incidental CT findings: None. CHEST: Hypermetabolic left axillary lymphadenopathy with dominant 1.0 cm left axillary node with max SUV 23.6 (series 4/image 59). No enlarged or hypermetabolic right  axillary nodes. Several mildly enlarged hypermetabolic bilateral mediastinal lymph nodes. Representative 1.1 cm right paratracheal node with max SUV 14.2 (series 4/image 65). Representative 1.0 cm subcarinal node with max SUV 29.5 (series 4/image 75). Hypermetabolic left hilar adenopathy with max SUV 7.4. No hypermetabolic right hilar adenopathy. Generalized hypermetabolism throughout the bilateral lung fields with associated faint ground-glass opacity in both lungs with representative max SUV 5.1 in the left lower lobe. Incidental CT findings: Coronary atherosclerosis. Atherosclerotic nonaneurysmal thoracic aorta. No significant pulmonary nodules. ABDOMEN/PELVIS: Widespread hypermetabolic lymphadenopathy in the porta hepatis, portacaval, aortocaval, left para-aortic, mesenteric, left common iliac, left external iliac and left inguinal nodal chains. Representative 1.3 cm porta hepatis node with max SUV 13.0 (series 5/image 115). Representative 1.3 cm aortocaval node with max SUV 25.7 (series 4/image 141). Representative 1.5 cm left para-aortic node with max SUV 26.7 (series 4/image 138). Representative 1.0 cm right mesenteric node with max SUV 21.2 (series 4/image 145). Representative 1.3 cm left external iliac node with max SUV 15.8 (series 4/image 172). Representative 1.0 cm  left inguinal node with max SUV 15.6 (series 4/image 187). Mild-to-moderate splenomegaly. Craniocaudal splenic length 16.3 cm. Splenic FDG uptake is equivalent to the liver with max SUV 4.4. No focal splenic masses. No abnormal hypermetabolic activity within the liver, pancreas or adrenal glands. Incidental CT findings: Postsurgical changes from fundoplication in the proximal stomach. Atherosclerotic nonaneurysmal abdominal aorta. Hysterectomy. SKELETON: Multiple hypermetabolic skeletal lesions in the axial and visualized proximal appendicular skeleton. Representative right T11 vertebral/posterior element lesion with max SUV 15.4.  Representative proximal left humerus metaphysis lesion with max SUV 5.0. Representative left mandibular body lesion with max SUV 6.7. Incidental CT findings: None. IMPRESSION: 1. Widespread intensely hypermetabolic lymphadenopathy in the neck, left axilla, mediastinum, retroperitoneum and left pelvis. Mild-to-moderate splenomegaly. Scattered hypermetabolic skeletal lesions. Generalized bilateral pulmonary hypermetabolism with associated faint ground-glass opacity. Findings are most compatible with lymphoproliferative disorder/lymphoma. Deauville score 5. 2. Chronic findings include: Aortic Atherosclerosis (ICD10-I70.0). Coronary atherosclerosis. Electronically Signed   By: [name] [name] M.D.   On: 08/29/2021 10:09   IR BONE MARROW BIOPSY & ASPIRATION  Result Date: 08/13/2021 INDICATION: pancytopenia, sweats, splenomegaly, anorexia EXAM: Bone marrow aspiration and core biopsy using fluoroscopic guidance MEDICATIONS: None. ANESTHESIA/SEDATION: Moderate (conscious) sedation was employed during this procedure. A total of Versed 2 mg and Fentanyl 100 mcg was administered intravenously. Moderate Sedation Time: 10 minutes. The patient's level of consciousness and vital signs were monitored continuously by radiology nursing throughout the procedure under my direct supervision. FLUOROSCOPY TIME:  Fluoroscopy Time: 1.8 minutes (18 mGy) COMPLICATIONS: None immediate. PROCEDURE: Informed written consent was obtained from the patient after a thorough discussion of the procedural risks, benefits and alternatives. All questions were addressed. Maximal Sterile Barrier Technique was utilized including caps, mask, sterile gowns, sterile gloves, sterile drape, hand hygiene and skin antiseptic. A timeout was performed prior to the initiation of the procedure. The patient was placed prone on the IR exam table. Limited fluoroscopy of the pelvis was performed for planning purposes. Skin entry site was marked, and the overlying skin was  prepped and draped in the standard sterile fashion. Local analgesia was obtained with 1% lidocaine. Using fluoroscopic guidance, an 11 gauge needle was advanced just deep to the cortex of the right posterior ilium. Subsequently, bone marrow aspiration and core biopsy were performed. Specimens were submitted to lab/pathology for handling. Hemostasis was achieved with manual pressure, and a clean dressing was placed. The patient tolerated the procedure well without immediate complication. IMPRESSION: Successful bone marrow aspiration and core biopsy of the right posterior ilium using fluoroscopic guidance. Electronically Signed   By: [name] [name] M.D.   On: 08/13/2021 14:00    Labs:  CBC: Recent Labs    09/04/21 0340 09/05/21 0410 09/06/21 0957 09/06/21 2230 09/07/21 0630  WBC 1.9* 1.5* 1.4*  --  4.5  HGB 7.4* 7.1* 6.8* 9.2* 8.5*  HCT 24.2* 23.2* 22.4* 29.1* 26.9*  PLT 64* 61* 64*  --  78*    COAGS: Recent Labs    09/12/20 1007 08/29/21 0540 08/29/21 0806 08/30/21 0543  INR 1.2 2.4* 2.3* 1.6*  APTT  --   --  49* 38*    BMP: Recent Labs    09/04/21 0340 09/05/21 0410 09/06/21 0957 09/07/21 0630  NA 140 140 142 142  K 4.0 3.8 3.9 3.9  CL 114* 114* 117* 117*  CO2 22 21* 20* 21*  GLUCOSE 129* 108* 196* 120*  BUN 26* 20 25* 27*  CALCIUM 8.0* 7.8* 7.9* 8.3*  CREATININE 1.30* 1.06* 0.96 1.03*  GFRNONAA  43* 55* >60 57*    LIVER FUNCTION TESTS: Recent Labs    08/30/21 0543 09/01/21 0519 09/02/21 1352 09/04/21 0340  BILITOT 0.7 0.6 1.0 0.8  AST 19 21 54* 43*  ALT 9 12 19  34  ALKPHOS 39 45 72 73  PROT 4.2* 4.2* 4.8* 4.3*  ALBUMIN 2.0* 2.0* 2.5* 1.9*    TUMOR MARKERS: No results for input(s): "AFPTM", "CEA", "CA199", "CHROMGRNA" in the last 8760 hours.  Assessment and Plan: 76 y.o. 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   VSS CBC with low hgb and plt but stable - received 1 PRBC on 8/26 due to hgb 6.8  Risks and benefits of image  guided port-a-catheter placement was discussed with the patient including, but not limited to bleeding, infection, pneumothorax, or fibrin sheath development and need for additional procedures.  All of the patient's questions were answered, patient is agreeable to proceed. Consent signed and in chart.  The procedure is tentatively scheduled for Monday pending IR schedule.  This procedure can be scheduled as outpatient as well.  NPO at [facility] [place]   Thank you for this interesting consult.  I greatly enjoyed meeting YARAH FUENTE and look forward to participating in their care.  A copy of this report was sent to the requesting provider on this date.  Electronically Signed: Tera Mater, PA-C 09/07/2021, 2:05 PM   I spent a total of 20 Minutes    in face to face in clinical consultation, greater than 50% of which was counseling/coordinating care for [facility] placement.   This chart was dictated using voice recognition software.  Despite best efforts to proofread,  errors can occur which can change the documentation meaning.

## 2021-09-07 NOTE — Progress Notes (Signed)
HEMATOLOGY-ONCOLOGY PROGRESS NOTE  ASSESSMENT AND PLAN: Large B-cell lymphoma presenting with macrocytic anemia/thrombocytopenia -08/13/2020 B12 3500 -08/13/2020 LDH 612 -08/13/2020 myeloma panel-no monoclonal protein  -Bone marrow biopsy 08/20/2020-hypercellular marrow with erythroid hyperplasia and dyspoiesis, rare lipogranuloma like lesions.  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  Negative myeloma FISH panel, 46XX; neotype myeloid disorders profile NGS sequencing-no pathogenic mutations detected in any of the genes on the NGS panel -PNH screen - 08/26/2020 -CTs 02/17/2021-new splenomegaly; diffuse bilateral bronchial wall thickening; clustered groundglass and fine nodular opacity in the dependent right lower lobe.  CAD. -Bone marrow biopsy 08/13/2021-hypercellular bone marrow for age with trilineage hematopoiesis; several atypical lymphoid aggregates present; no increase in blastic cells; flow cytometry without significant T or B-cell abnormalities; normal cytogenetics; "the limited morphologic and immunohistochemical features are atypical and a lymphoproliferative process is not excluded" -PET scan 0/93/2671-[id number] hypermetabolic lymphadenopathy in the neck, left axilla, mediastinum, retroperitoneum, and left pelvis.  Hypermetabolic skeletal lesions, generalized bilateral pulmonary hypermetabolism -08/30/2021-1 unit RBCs -Excisional lymph node biopsy left groin 08/30/2021-Large B-cell lymphoma, CD20 positive, CD30 positive -Cycle 1 R-CHOP 09/05/2021 Fatigue Dyspnea on exertion Unsteady gait/balance disorder, followed by Dr. Tomi Likens CKD Atrial fibrillation Hypertension Sleep apnea Anorexia/weight loss 06/13/2021 skin rash, question vasculitis-course of prednisone initiated; 06/20/2021 marked improvement, rash recurred when she was tapered off of prednisone, prednisone resumed by dermatology Coagulopathy-most likely related to malnutrition and apixaban  [name] now at day 3  following cycle 1 R-CHOP.  She tolerated the chemotherapy without acute toxicity.  She continues prednisone and G-CSF.  The leg edema is secondary to hypoalbuminemia and intravenous hydration.  IV fluids have been discontinued.   Recommendations: 1.  Continue G-CSF and prednisone today  2.  Continue allopurinol 3.  Increase ambulation 4.  Port-A-Cath placement per interventional radiology, hopefully on 09/08/2021 5.  She appears stable for discharge 09/08/2021, she will continue allopurinol for a total of 7 days and G-CSF for a total of 7 days, we can arrange for G-CSF therapy at the cancer center. [name] [name]   SUBJECTIVE: She complains of leg swelling.  She wants to have a Port-A-Cath placed prior to discharge.   PHYSICAL EXAMINATION:  Vitals:   09/06/21 2038 09/07/21 0639  BP: (!) 127/56 124/63  Pulse: (!) 58 (!) 53  Resp: 20 16  Temp: (!) 97.4 F (36.3 C)   SpO2: 100% 100%   Filed Weights   09/06/21 0500 09/07/21 0500 09/07/21 0700  Weight: 210 lb 15.7 oz (95.7 kg) 206 lb 12.7 oz (93.8 kg) 203 lb 14.8 oz (92.5 kg)    Intake/Output from previous day: 08/26 0701 - 08/27 0700 In: 2492.8 [P.O.:360; I.V.:1438.1; Blood:694.7] Out: 650 [Urine:650]  Physical examination:  HEENT-no thrush, single petechiae at the tip of the tongue Cardiovascular-the rate and rhythm Lungs-clear bilaterally Vascular- 1+ pitting edema at the right greater than left lower leg  LABORATORY DATA:  I have reviewed the data as listed    Latest Ref Rng & Units 09/07/2021    6:30 AM 09/06/2021    9:57 AM 09/05/2021    4:10 AM  CMP  Glucose 70 - 99 mg/dL 120  196  108   BUN 8 - 23 mg/dL 27  25  20    Creatinine 0.44 - 1.00 mg/dL 1.03  0.96  1.06   Sodium 135 - 145 mmol/L 142  142  140   Potassium 3.5 - 5.1 mmol/L 3.9  3.9  3.8   Chloride 98 - 111 mmol/L 117  117  114  CO2 22 - 32 mmol/L 21  20  21    Calcium 8.9 - 10.3 mg/dL 8.3  7.9  7.8     Lab Results  Component Value Date   WBC 1.4 (LL)  09/06/2021   HGB 9.2 (L) 09/06/2021   HCT 29.1 (L) 09/06/2021   MCV 97.0 09/06/2021   PLT 64 (L) 09/06/2021   NEUTROABS 1.0 (L) 09/06/2021    No results found for: "CEA1", "CEA", "CAN199", "CA125", "PSA1"  VAS US CAROTID  Result Date: 09/04/2021 Carotid Arterial Duplex Study Patient Name:  [name] [name]  Date of Exam:   09/04/2021 Medical Rec #: [id number]      Accession #:    [id number] Date of Birth: [birth year]       Patient Gender: F Patient Age:   76 years Exam Location:  [facility] Procedure:      VAS US CAROTID Referring Phys: DAVID ORTIZ --------------------------------------------------------------------------------  Indications:       Syncope. Risk Factors:      Hypertension, hyperlipidemia. Limitations        Today's exam was limited due to the patient's respiratory                    variation. Comparison Study:  No prior studies. Performing Technologist: Oliver Hum RVT  Examination Guidelines: A complete evaluation includes B-mode imaging, spectral Doppler, color Doppler, and power Doppler as needed of all accessible portions of each vessel. Bilateral testing is considered an integral part of a complete examination. Limited examinations for reoccurring indications may be performed as noted.  Right Carotid Findings: +----------+--------+--------+--------+-----------------------+--------+           PSV cm/sEDV cm/sStenosisPlaque Description     Comments +----------+--------+--------+--------+-----------------------+--------+ CCA Prox  92      12                                     tortuous +----------+--------+--------+--------+-----------------------+--------+ CCA Distal73      14              smooth and heterogenous         +----------+--------+--------+--------+-----------------------+--------+ ICA Prox  74      16              smooth and heterogenous         +----------+--------+--------+--------+-----------------------+--------+ ICA Distal65       14                                     tortuous +----------+--------+--------+--------+-----------------------+--------+ ECA       89      4                                               +----------+--------+--------+--------+-----------------------+--------+ +----------+--------+-------+--------+-------------------+           PSV cm/sEDV cmsDescribeArm Pressure (mmHG) +----------+--------+-------+--------+-------------------+ [id number]                                        +----------+--------+-------+--------+-------------------+ +---------+--------+--+--------+-+---------+ VertebralPSV cm/s57EDV cm/s8Antegrade +---------+--------+--+--------+-+---------+  Left Carotid Findings: +----------+-------+-------+--------+---------------------------------+--------+           PSV  EDV    StenosisPlaque Description               Comments           cm/s   cm/s                                                     +----------+-------+-------+--------+---------------------------------+--------+ CCA Prox  95     14                                                       +----------+-------+-------+--------+---------------------------------+--------+ CCA Distal77     14             smooth and heterogenous                   +----------+-------+-------+--------+---------------------------------+--------+ ICA Prox  86     19             calcific, irregular and          tortuous                                 heterogenous                              +----------+-------+-------+--------+---------------------------------+--------+ ICA Distal78     15                                              tortuous +----------+-------+-------+--------+---------------------------------+--------+ ECA       151    14                                                       +----------+-------+-------+--------+---------------------------------+--------+  +----------+--------+--------+--------+-------------------+           PSV cm/sEDV cm/sDescribeArm Pressure (mmHG) +----------+--------+--------+--------+-------------------+ [id number]                                         +----------+--------+--------+--------+-------------------+ +---------+--------+--+--------+--+---------+ VertebralPSV cm/s54EDV cm/s12Antegrade +---------+--------+--+--------+--+---------+   Summary: Right Carotid: Velocities in the right ICA are consistent with a 1-39% stenosis. Left Carotid: Velocities in the left ICA are consistent with a 1-39% stenosis. Vertebrals: Bilateral vertebral arteries demonstrate antegrade flow. *See table(s) above for measurements and observations.  Electronically signed by Monica Martinez MD on 09/04/2021 at 1:02:46 PM.    Final    ECHOCARDIOGRAM COMPLETE  Result Date: 09/04/2021    ECHOCARDIOGRAM REPORT   Patient Name:   [name] [name] Date of Exam: 09/04/2021 Medical Rec #:  [id number]     Height:       67.0 in Accession #:    [id number]    Weight:       197.8 lb Date of  Birth:  [birth year]      BSA:          2.013 m Patient Age:    4 years      BP:           115/51 mmHg Patient Gender: F             HR:           83 bpm. Exam Location:  Inpatient Procedure: 2D Echo, Cardiac Doppler and Color Doppler Indications:    Chemotherapy  History:        Patient has prior history of Echocardiogram examinations, most                 recent 08/09/2020. Risk Factors:Sleep Apnea, Diabetes and                 Hypertension.  Sonographer:    Jefferey Pica Referring Phys: [place]  1. Left ventricular ejection fraction, by estimation, is 65 to 70%. The left ventricle has normal function. The left ventricle has no regional wall motion abnormalities. Left ventricular diastolic parameters were normal.  2. Right ventricular systolic function is normal. The right ventricular size is normal. There is moderately elevated pulmonary  artery systolic pressure.  3. The mitral valve is degenerative. Mild mitral valve regurgitation. Moderate mitral annular calcification.  4. The aortic valve is tricuspid. There is mild calcification of the aortic valve. There is mild thickening of the aortic valve. Aortic valve regurgitation is not visualized. Aortic valve sclerosis is present, with no evidence of aortic valve stenosis.  5. The inferior vena cava is normal in size with greater than 50% respiratory variability, suggesting right atrial pressure of 3 mmHg. Comparison(s): No significant change from prior study. FINDINGS  Left Ventricle: Left ventricular ejection fraction, by estimation, is 65 to 70%. The left ventricle has normal function. The left ventricle has no regional wall motion abnormalities. The left ventricular internal cavity size was normal in size. There is  no left ventricular hypertrophy. Left ventricular diastolic parameters were normal. Right Ventricle: The right ventricular size is normal. No increase in right ventricular wall thickness. Right ventricular systolic function is normal. There is moderately elevated pulmonary artery systolic pressure. The tricuspid regurgitant velocity is 3.62 m/s, and with an assumed right atrial pressure of 5 mmHg, the estimated right ventricular systolic pressure is 28.7 mmHg. Left Atrium: Left atrial size was normal in size. Right Atrium: Right atrial size was normal in size. Pericardium: There is no evidence of pericardial effusion. Mitral Valve: The mitral valve is degenerative in appearance. There is mild thickening of the mitral valve leaflet(s). There is mild calcification of the mitral valve leaflet(s). Moderate mitral annular calcification. Mild mitral valve regurgitation. Tricuspid Valve: The tricuspid valve is normal in structure. Tricuspid valve regurgitation is mild. Aortic Valve: The aortic valve is tricuspid. There is mild calcification of the aortic valve. There is mild thickening of the  aortic valve. Aortic valve regurgitation is not visualized. Aortic valve sclerosis is present, with no evidence of aortic valve stenosis. Aortic valve peak gradient measures 12.1 mmHg. Pulmonic Valve: The pulmonic valve was normal in structure. Pulmonic valve regurgitation is trivial. Aorta: The aortic root and ascending aorta are structurally normal, with no evidence of dilitation. Venous: The inferior vena cava is normal in size with greater than 50% respiratory variability, suggesting right atrial pressure of 3 mmHg. IAS/Shunts: The atrial septum is grossly normal.  LEFT VENTRICLE PLAX 2D LVIDd:  4.20 cm   Diastology LVIDs:         2.30 cm   LV e' medial:    7.88 cm/s LV PW:         1.10 cm   LV E/e' medial:  16.6 LV IVS:        1.00 cm   LV e' lateral:   9.08 cm/s LVOT diam:     1.80 cm   LV E/e' lateral: 14.4 LV SV:         88 LV SV Index:   43 LVOT Area:     2.54 cm  RIGHT VENTRICLE RV S prime:     19.20 cm/s TAPSE (M-mode): 2.6 cm LEFT ATRIUM             Index        RIGHT ATRIUM           Index LA diam:        4.50 cm 2.24 cm/m   RA Area:     17.30 cm LA Vol (A2C):   57.4 ml 28.52 ml/m  RA Volume:   45.00 ml  22.36 ml/m LA Vol (A4C):   54.0 ml 26.83 ml/m LA Biplane Vol: 57.5 ml 28.57 ml/m  AORTIC VALVE                  PULMONIC VALVE AV Area (Vmax): 2.33 cm      PV Vmax:       1.12 m/s AV Vmax:        174.00 cm/s   PV Peak grad:  5.0 mmHg AV Peak Grad:   12.1 mmHg LVOT Vmax:      159.00 cm/s LVOT Vmean:     108.000 cm/s LVOT VTI:       0.344 m  AORTA Ao Root diam: 3.10 cm Ao Asc diam:  3.40 cm MITRAL VALVE                TRICUSPID VALVE MV Area (PHT): 3.65 cm     TR Peak grad:   52.4 mmHg MV Decel Time: 208 msec     TR Vmax:        362.00 cm/s MV E velocity: 131.00 cm/s MV A velocity: 38.60 cm/s   SHUNTS MV E/A ratio:  3.39         Systemic VTI:  0.34 m                             Systemic Diam: 1.80 cm Gwyndolyn Kaufman MD Electronically signed by Gwyndolyn Kaufman MD Signature Date/Time:  09/04/2021/11:09:03 AM    Final    [place] EKG SITE RITE  Result Date: 09/03/2021 If Site Rite image not attached, placement could not be confirmed due to current cardiac rhythm.  DG Chest Portable 1 View  Result Date: 09/03/2021 CLINICAL DATA:  Provided history: Hematemesis. Additional history provided: Weakness, recent diagnosis of lymphoma. EXAM: PORTABLE CHEST 1 VIEW COMPARISON:  Head CT 08/27/2021. Prior chest radiographs 04/20/2021 and earlier. FINDINGS: Heart size at the upper limits of normal. Minimal atelectasis within the left lung base. No appreciable airspace consolidation or pulmonary edema. No evidence of pleural effusion or pneumothorax. No acute bony abnormality identified. Dextrocurvature of the thoracic spine. IMPRESSION: Minimal atelectasis within the left lung base. Otherwise, no evidence of acute cardiopulmonary abnormality. Electronically Signed   By: Kellie Simmering D.O.   On: 09/03/2021 12:20   NM PET Image Initial (PI)  Skull Base To Thigh  Result Date: 08/29/2021 CLINICAL DATA:  Initial treatment strategy for progressive pancytopenia. Severe fatigue. Anorexia with weight loss. Night sweats. EXAM: NUCLEAR MEDICINE PET SKULL BASE TO THIGH TECHNIQUE: 9.1 mCi F-18 FDG was injected intravenously. Full-ring PET imaging was performed from the skull base to thigh after the radiotracer. CT data was obtained and used for attenuation correction and anatomic localization. Fasting blood glucose: 99 mg/dl COMPARISON:  02/17/2021 CT chest, abdomen and pelvis. FINDINGS: Mediastinal blood pool activity: SUV max 2.7 Liver activity: SUV max 4.3 NECK: Several small hypermetabolic bilateral neck lymph nodes. Representative left level 2 neck 0.5 cm node with max SUV 10.5 (series 4/image 34). Representative 0.6 cm right level 3 neck node with max SUV 13.8 (series 4/image 38). Incidental CT findings: None. CHEST: Hypermetabolic left axillary lymphadenopathy with dominant 1.0 cm left axillary node with max SUV  23.6 (series 4/image 59). No enlarged or hypermetabolic right axillary nodes. Several mildly enlarged hypermetabolic bilateral mediastinal lymph nodes. Representative 1.1 cm right paratracheal node with max SUV 14.2 (series 4/image 65). Representative 1.0 cm subcarinal node with max SUV 29.5 (series 4/image 75). Hypermetabolic left hilar adenopathy with max SUV 7.4. No hypermetabolic right hilar adenopathy. Generalized hypermetabolism throughout the bilateral lung fields with associated faint ground-glass opacity in both lungs with representative max SUV 5.1 in the left lower lobe. Incidental CT findings: Coronary atherosclerosis. Atherosclerotic nonaneurysmal thoracic aorta. No significant pulmonary nodules. ABDOMEN/PELVIS: Widespread hypermetabolic lymphadenopathy in the porta hepatis, portacaval, aortocaval, left para-aortic, mesenteric, left common iliac, left external iliac and left inguinal nodal chains. Representative 1.3 cm porta hepatis node with max SUV 13.0 (series 5/image 115). Representative 1.3 cm aortocaval node with max SUV 25.7 (series 4/image 141). Representative 1.5 cm left para-aortic node with max SUV 26.7 (series 4/image 138). Representative 1.0 cm right mesenteric node with max SUV 21.2 (series 4/image 145). Representative 1.3 cm left external iliac node with max SUV 15.8 (series 4/image 172). Representative 1.0 cm left inguinal node with max SUV 15.6 (series 4/image 187). Mild-to-moderate splenomegaly. Craniocaudal splenic length 16.3 cm. Splenic FDG uptake is equivalent to the liver with max SUV 4.4. No focal splenic masses. No abnormal hypermetabolic activity within the liver, pancreas or adrenal glands. Incidental CT findings: Postsurgical changes from fundoplication in the proximal stomach. Atherosclerotic nonaneurysmal abdominal aorta. Hysterectomy. SKELETON: Multiple hypermetabolic skeletal lesions in the axial and visualized proximal appendicular skeleton. Representative right T11  vertebral/posterior element lesion with max SUV 15.4. Representative proximal left humerus metaphysis lesion with max SUV 5.0. Representative left mandibular body lesion with max SUV 6.7. Incidental CT findings: None. IMPRESSION: 1. Widespread intensely hypermetabolic lymphadenopathy in the neck, left axilla, mediastinum, retroperitoneum and left pelvis. Mild-to-moderate splenomegaly. Scattered hypermetabolic skeletal lesions. Generalized bilateral pulmonary hypermetabolism with associated faint ground-glass opacity. Findings are most compatible with lymphoproliferative disorder/lymphoma. Deauville score 5. 2. Chronic findings include: Aortic Atherosclerosis (ICD10-I70.0). Coronary atherosclerosis. Electronically Signed   By: Ilona Sorrel M.D.   On: 08/29/2021 10:09   IR BONE MARROW BIOPSY & ASPIRATION  Result Date: 08/13/2021 INDICATION: pancytopenia, sweats, splenomegaly, anorexia EXAM: Bone marrow aspiration and core biopsy using fluoroscopic guidance MEDICATIONS: None. ANESTHESIA/SEDATION: Moderate (conscious) sedation was employed during this procedure. A total of Versed 2 mg and Fentanyl 100 mcg was administered intravenously. Moderate Sedation Time: 10 minutes. The patient's level of consciousness and vital signs were monitored continuously by radiology nursing throughout the procedure under my direct supervision. FLUOROSCOPY TIME:  Fluoroscopy Time: 1.8 minutes (18 mGy) COMPLICATIONS: None immediate. PROCEDURE: Informed  written consent was obtained from the patient after a thorough discussion of the procedural risks, benefits and alternatives. All questions were addressed. Maximal Sterile Barrier Technique was utilized including caps, mask, sterile gowns, sterile gloves, sterile drape, hand hygiene and skin antiseptic. A timeout was performed prior to the initiation of the procedure. The patient was placed prone on the IR exam table. Limited fluoroscopy of the pelvis was performed for planning purposes.  Skin entry site was marked, and the overlying skin was prepped and draped in the standard sterile fashion. Local analgesia was obtained with 1% lidocaine. Using fluoroscopic guidance, an 11 gauge needle was advanced just deep to the cortex of the right posterior ilium. Subsequently, bone marrow aspiration and core biopsy were performed. Specimens were submitted to lab/pathology for handling. Hemostasis was achieved with manual pressure, and a clean dressing was placed. The patient tolerated the procedure well without immediate complication. IMPRESSION: Successful bone marrow aspiration and core biopsy of the right posterior ilium using fluoroscopic guidance. Electronically Signed   By: Albin Felling M.D.   On: 08/13/2021 14:00     Future Appointments  Date Time Provider [place]  09/22/2021 12:00 PM [facility]  10/09/2021  9:00 AM Jerline Pain, MD [facility]      LOS: 3 days

## 2021-09-07 NOTE — Progress Notes (Signed)
PROGRESS NOTE   [name] [name]  URK:[id number]    DOB: 04-20-1945    DOA: 09/03/2021  PCP: Harlan Stains, MD   I have briefly reviewed patients previous medical records in [facility].  Chief Complaint  Patient presents with   Weakness    Brief Narrative:  76 year old female with PMH of persistent A-fib, stage III CKD, anxiety and depression, GERD, HLD, HTN, prediabetes, OSA on no CPAP, hypothyroidism, recent hospital admission 08/28/2021 - 09/01/2021 for generalized weakness, poor appetite, nightly chills over the last year and a half, and a 40 pound weight loss, underwent lymph node biopsy, newly diagnosed with diffuse large B cell lymphoma, seen in oncology office on 8/22 with plans to initiate systemic chemotherapy with R-CHOP next week, readmitted to the hospital on 09/03/2021 due to worsening fatigue, weakness and a syncopal episode while in the ED.  Oncology consulted and started first cycle of R-CHOP in the hospital on 8/25.   Assessment & Plan:  Principal Problem:   Syncope Active Problems:   Generalized anxiety disorder   PAF (paroxysmal atrial fibrillation) (HCC)   Hyperlipidemia   Essential hypertension   Gastroesophageal reflux disease without esophagitis   (HFpEF) heart failure with preserved ejection fraction (HCC)   OSA (obstructive sleep apnea)   Hypothyroidism   Pancytopenia (HCC)   NHL (non-Hodgkin's lymphoma) (HCC)   Prediabetes   Large B cell lymphoma: History as noted above.  New diagnosis.  Final pathology from inguinal lymph node biopsy confirms diffuse large B-cell lymphoma.  Oncology input appreciated and S/P first cycle of R-CHOP in the hospital on 8/25.  Prednisone 60 Mg daily x4 days.  Continue allopurinol.  Serum uric acid 5.2 > 4.2 >4.5.  LDH 929.  Dr. Learta Codding, oncology follow-up appreciated, has cleared for DC home on 8/28 pending Port-A-Cath placement by IR.  Continue total 7 days of allopurinol, total 7 days of G-CSF arranged through the cancer  center.  Pancytopenia: Secondary to lymphoma.  Follow CBCs closely.  Transfuse PRBC if hemoglobin 7 g or less, platelets if counts less than 20 K or bleeding .  Starting Granix 8/26.  Hemoglobin down to 6.8 and WBC 1.4 on 8/26.  S/p 2 units PRBC, hemoglobin up to 9.2 > 8.5.  WBC up from 1.4-4.5 after Granix initiation.  Hemoptysis: Suspect low volume, "spit up some blood".  Unclear etiology.  Is on apixaban and also has thrombocytopenia as above.  No further episodes.  As discussed with Dr. Benay Spice, expect thrombocytopenia to worsen after chemo and risks of bleeding outweigh benefits of apixaban anticoagulation for stroke prophylaxis, hence discontinued Eliquis for now.  No further episodes.  As discussed with spouse at bedside, appears to be minimal.  Will need to discuss with Dr. Benay Spice regarding timing of resumption of Eliquis.  Syncope: Reportedly occurred in ED triage on 8/23.  Bolused with 2 L of IV saline.  HS Troponin x2 negative at 11 > 9.  TTE: LVEF 65-70%, no aortic stenosis.  No significant change from prior study.  CUS: Bilateral ICA consistent with 1-39% stenosis.  Check orthostatics.  If patient drives, should be advised not to drive for 6 months.  Telemetry had shown sinus rhythm without arrhythmias or pauses  Anxiety disorder: Continue home dose of Remeron, bupropion and alprazolam.  Persistent atrial fibrillation: CHA2DS2-VASc score: 6.  Continue amiodarone, Toprol-XL.  Eliquis discontinued for now and timing of reinitiation to be determined after discussing with oncology..  Currently in sinus rhythm.  Off telemetry.  Hyperlipidemia: Continue  pravastatin  Essential hypertension: Controlled on metoprolol succinate, continue  GERD without esophagitis: Continue pantoprazole  Chronic diastolic CHF: Lower extremity edema but no respiratory symptoms.  Discontinued IV fluids.  Added lower extremity compression stockings but patient does not want to wear these due to  discomfort and asking for Lasix.  Lower extremity edema due to IVF, hypoalbuminemia.  Low-dose Lasix started.  OSA, not on CPAP Noted  Hypothyroidism TSH 0.149 in the context of acute illness.  Clinically euthyroid.  Check free T3 and free T4, will likely resume prior home dose of Synthroid.  Prediabetes A1c 5.8 in 2018.  Good glycemic control since hospital admission.  DC'd CBGs.  Outpatient follow-up.  Acute kidney injury complicating chronic kidney disease stage II Baseline creatinine initially felt to be in the 1.4-1.6 range.  However creatinine has steadily decreased down to 0.96 since admission.  Thereby not sure about above baseline.  Currently I feel she has stage II CKD and not stage III.  Follow BMP in a.m.  Elevated lactate/lactic acidosis: Resolved.  Constipation Initiated bowel regimen.  Body mass index is 31.94 kg/m.   DVT prophylaxis: Place TED hose Start: 09/06/21 1636 SCDs Start: 09/03/21 1459.     Code Status: DNR:  Family Communication: Spouse at bedside Disposition:  Status is: Inpatient Hopeful DC home 8/28 pending stability of CBCs and Port-A-Cath placement by IR     Consultants:   Medical oncology  Procedures:     Antimicrobials:      Subjective:  Asking for laxatives, no BM in 4 days.  Leg swellings but does not want to use compression stockings and is asking for Lasix.  Feels sleepy.  Did not sleep well last night, no particular reason.  Feels good after a shower.  Objective:   Vitals:   09/06/21 2038 09/07/21 0500 09/07/21 0639 09/07/21 0700  BP: (!) 127/56  124/63   Pulse: (!) 58  (!) 53   Resp: 20  16   Temp: (!) 97.4 F (36.3 C)     TempSrc: Oral     SpO2: 100%  100%   Weight:  93.8 kg  92.5 kg  Height:        General exam: Elderly female, small built, frail and chronically ill sitting up comfortably in reclining chair with her spouse at her side.  Appears to be in good spirits. Respiratory system: Clear to auscultation.  No  increased work of breathing. Cardiovascular system: S1 and S2 heard, RRR.  No JVD or murmurs.  1-2+ pitting bilateral leg edema. Gastrointestinal system: Abdomen is nondistended, soft and nontender. No organomegaly or masses felt. Normal bowel sounds heard. Central nervous system: Alert and oriented. No focal neurological deficits. Extremities: Symmetric 5 x 5 power. Skin: No rashes, lesions or ulcers Psychiatry: Judgement and insight appear normal. Mood & affect: More cheerful today.   Data Reviewed:   I have personally reviewed following labs and imaging studies   CBC: Recent Labs  Lab 09/05/21 0410 09/06/21 0957 09/06/21 2230 09/07/21 0630  WBC 1.5* 1.4*  --  4.5  NEUTROABS 1.0* 1.0*  --  3.9  HGB 7.1* 6.8* 9.2* 8.5*  HCT 23.2* 22.4* 29.1* 26.9*  MCV 95.9 97.0  --  94.4  PLT 61* 64*  --  78*    Basic Metabolic Panel: Recent Labs  Lab 09/01/21 0519 09/02/21 1352 09/03/21 1220 09/03/21 1224 09/04/21 0340 09/05/21 0410 09/06/21 0957 09/07/21 0630  NA 141   < > 140  --  140 140 142  142  K 3.8   < > 4.2  --  4.0 3.8 3.9 3.9  CL 120*   < > 113*  --  114* 114* 117* 117*  CO2 18*   < > 20*  --  22 21* 20* 21*  GLUCOSE 164*   < > 102*  --  129* 108* 196* 120*  BUN 27*   < > 25*  --  26* 20 25* 27*  CREATININE 1.50*   < > 1.43*  --  1.30* 1.06* 0.96 1.03*  CALCIUM 8.2*   < > 8.6*  --  8.0* 7.8* 7.9* 8.3*  MG 2.0  --   --  1.8  --   --   --   --   PHOS 3.0  --   --   --   --   --  3.9 4.1   < > = values in this interval not displayed.    Liver Function Tests: Recent Labs  Lab 09/01/21 0519 09/02/21 1352 09/04/21 0340  AST 21 54* 43*  ALT 12 19 34  ALKPHOS 45 72 73  BILITOT 0.6 1.0 0.8  PROT 4.2* 4.8* 4.3*  ALBUMIN 2.0* 2.5* 1.9*    CBG: Recent Labs  Lab 09/03/21 1158 09/04/21 0745 09/04/21 1158  GLUCAP 102* 87 119*    Microbiology Studies:   Recent Results (from the past 240 hour(s))  Culture, blood (Routine X 2) w Reflex to ID Panel     Status:  None   Collection Time: 08/29/21  9:11 AM   Specimen: BLOOD  Result Value Ref Range Status   Specimen Description   Final    BLOOD LEFT ANTECUBITAL Performed at [facility], [place] [address], [place], [place] [address]    Special Requests   Final    BOTTLES DRAWN AEROBIC AND ANAEROBIC Blood Culture adequate volume Performed at [place] [address], [place], [place] [address]    Culture   Final    NO GROWTH 5 DAYS Performed at [facility], [place] [address], [place], [place] [address]    Report Status 09/03/2021 FINAL  Final  Culture, blood (Routine X 2) w Reflex to ID Panel     Status: None   Collection Time: 08/29/21  9:27 AM   Specimen: BLOOD  Result Value Ref Range Status   Specimen Description   Final    BLOOD RIGHT ANTECUBITAL Performed at [place] [address], [place], [place] [address]    Special Requests   Final    BOTTLES DRAWN AEROBIC AND ANAEROBIC Blood Culture adequate volume Performed at [place] [facility] [address], [place], [place] [address]    Culture   Final    NO GROWTH 5 DAYS Performed at [facility], [place] [address] [place], [place] [address]    Report Status 09/03/2021 FINAL  Final  SARS Coronavirus 2 by RT PCR (hospital order, performed in [facility] hospital lab) *cepheid single result test* Anterior Nasal Swab     Status: None   Collection Time: 09/03/21  2:22 PM   Specimen: Anterior Nasal Swab  Result Value Ref Range Status   SARS Coronavirus 2 by RT PCR NEGATIVE NEGATIVE Final    Comment: (NOTE) SARS-CoV-2 target nucleic acids are NOT DETECTED.  The SARS-CoV-2 RNA is generally detectable in upper and lower respiratory specimens during the acute phase of infection. The lowest concentration of SARS-CoV-2 viral copies this assay can detect  is 250 copies / mL. A negative result does not preclude SARS-CoV-2 infection and should not be used as the  sole basis for treatment or other patient management decisions.  A negative result may occur with improper specimen collection / handling, submission of specimen other than nasopharyngeal swab, presence of viral mutation(s) within the areas targeted by this assay, and inadequate number of viral copies (<250 copies / mL). A negative result must be combined with clinical observations, patient history, and epidemiological information.  Fact Sheet for Patients:   https://www.patel.info/  Fact Sheet for Healthcare Providers: https://hall.com/  This test is not yet approved or  cleared by the [place] FDA and has been authorized for detection and/or diagnosis of SARS-CoV-2 by FDA under an Emergency Use Authorization (EUA).  This EUA will remain in effect (meaning this test can be used) for the duration of the COVID-19 declaration under Section 564(b)(1) of the Act, 21 U.S.C. section 360bbb-3(b)(1), unless the authorization is terminated or revoked sooner.  Performed at [facility], [place] [address] [place], [place] [address]     Radiology Studies:  No results found.  Scheduled Meds:    allopurinol  150 mg Oral Daily   amiodarone  200 mg Oral Daily   buPROPion  150 mg Oral Daily   Chlorhexidine Gluconate Cloth  6 each Topical Daily   loratadine  10 mg Oral Daily   magnesium oxide  200 mg Oral Daily   metoprolol succinate  25 mg Oral QHS   mirtazapine  30 mg Oral QHS   pantoprazole  40 mg Oral Daily   pravastatin  40 mg Oral QHS   predniSONE  60 mg Oral Q breakfast   sodium chloride flush  10-40 mL Intracatheter Q12H   sodium chloride flush  3 mL Intravenous Q12H   Tbo-filgastrim (GRANIX) SQ  480 mcg Subcutaneous q1800    Continuous Infusions:       LOS: 3 days     Vernell Leep, MD,  FACP, [facility], [facility], [facility] (Care Management Physician Certified) [place]  To  contact the attending provider between 7A-7P or the covering provider during after hours 7P-7A, please log into the web site [email or web address] and access using universal [place] password for that web site. If you do not have the password, please call the hospital operator.  09/07/2021, 2:06 PM

## 2021-09-08 ENCOUNTER — Other Ambulatory Visit: Payer: Self-pay | Admitting: Oncology

## 2021-09-08 DIAGNOSIS — D61818 Other pancytopenia: Secondary | ICD-10-CM | POA: Diagnosis not present

## 2021-09-08 DIAGNOSIS — C8598 Non-Hodgkin lymphoma, unspecified, lymph nodes of multiple sites: Secondary | ICD-10-CM | POA: Diagnosis not present

## 2021-09-08 DIAGNOSIS — R55 Syncope and collapse: Secondary | ICD-10-CM | POA: Diagnosis not present

## 2021-09-08 LAB — CBC WITH DIFFERENTIAL/PLATELET
Abs Immature Granulocytes: 0.4 10*3/uL — ABNORMAL HIGH (ref 0.00–0.07)
Basophils Absolute: 0 10*3/uL (ref 0.0–0.1)
Basophils Relative: 0 %
Eosinophils Absolute: 0 10*3/uL (ref 0.0–0.5)
Eosinophils Relative: 0 %
HCT: 28.3 % — ABNORMAL LOW (ref 36.0–46.0)
Hemoglobin: 9 g/dL — ABNORMAL LOW (ref 12.0–15.0)
Immature Granulocytes: 5 %
Lymphocytes Relative: 4 %
Lymphs Abs: 0.3 10*3/uL — ABNORMAL LOW (ref 0.7–4.0)
MCH: 30.4 pg (ref 26.0–34.0)
MCHC: 31.8 g/dL (ref 30.0–36.0)
MCV: 95.6 fL (ref 80.0–100.0)
Monocytes Absolute: 0.1 10*3/uL (ref 0.1–1.0)
Monocytes Relative: 2 %
Neutro Abs: 6.9 10*3/uL (ref 1.7–7.7)
Neutrophils Relative %: 89 %
Platelets: 94 10*3/uL — ABNORMAL LOW (ref 150–400)
RBC: 2.96 MIL/uL — ABNORMAL LOW (ref 3.87–5.11)
RDW: 17.4 % — ABNORMAL HIGH (ref 11.5–15.5)
WBC: 7.8 10*3/uL (ref 4.0–10.5)
nRBC: 0 % (ref 0.0–0.2)

## 2021-09-08 LAB — BASIC METABOLIC PANEL
Anion gap: 4 — ABNORMAL LOW (ref 5–15)
BUN: 33 mg/dL — ABNORMAL HIGH (ref 8–23)
CO2: 23 mmol/L (ref 22–32)
Calcium: 8.2 mg/dL — ABNORMAL LOW (ref 8.9–10.3)
Chloride: 113 mmol/L — ABNORMAL HIGH (ref 98–111)
Creatinine, Ser: 1.12 mg/dL — ABNORMAL HIGH (ref 0.44–1.00)
GFR, Estimated: 51 mL/min — ABNORMAL LOW (ref >60)
Glucose, Bld: 114 mg/dL — ABNORMAL HIGH (ref 70–99)
Potassium: 3.8 mmol/L (ref 3.5–5.1)
Sodium: 140 mmol/L (ref 135–145)

## 2021-09-08 NOTE — TOC Initial Note (Addendum)
Transition of Care [facility]) - Initial/Assessment Note    Patient Details  Name: [name] [name] MRN: [id number] Date of Birth: 15-Nov-1945  Transition [facility]) CM/SW Contact:    Illene Regulus, LCSW Phone Number: 09/08/2021, 3:07 PM  Clinical Narrative:                 CSW spoke with pt and spouse to discuss PT recommendations for [facility] PT. Pt agreed to the services. Pt reported not having a preference for [facility] agency. CSW informed pt she will reach out to agencies in the area to see who is able to accept pt's insurance. Pt reported having a bedside commode, and a rollator she has borrowed already in the home. The Pt is requesting a rolling [place], as the rollator is too large to fit throughout the house. CSW sent a referral out to [place] for a rolling [place]. TOC to follow.   [place]  3:40pm Pt declined rolling [place] due to it's sliver color. Pt told DME staff she will get it online.   Expected Discharge Plan: [place] Barriers to Discharge: Continued Medical Work up   Patient Goals and CMS Choice Patient states their goals for this hospitalization and ongoing recovery are:: return home CMS Medicare.gov Compare Post Acute Care list provided to:: Patient Choice offered to / list presented to : Patient, Spouse  Expected Discharge Plan and Services Expected Discharge Plan: [place]   Discharge Planning Services: CM Consult   Living arrangements for the past 2 months: Single Family Home                   DME Agency: [employer] Date DME Agency Contacted: 09/08/21 Time DME Agency Contacted: 0865 Representative spoke with at DME Agency: [place]: PT          Prior Living Arrangements/Services Living arrangements for the past 2 months: [place] Lives with:: Self, Spouse Patient language and need for interpreter reviewed:: Yes Do you feel safe going back to the place where you live?: Yes      Need for Family  Participation in Patient Care: No (Comment) Care giver support system in place?: Yes (comment) Current home services: DME Criminal Activity/Legal Involvement Pertinent to Current Situation/Hospitalization: No - Comment as needed  Activities of Daily Living Home Assistive Devices/Equipment: [place]w (specify type) ADL Screening (condition at time of admission) Patient's cognitive ability adequate to safely complete daily activities?: Yes Is the patient deaf or have difficulty hearing?: No Does the patient have difficulty seeing, even when wearing glasses/contacts?: No Does the patient have difficulty concentrating, remembering, or making decisions?: Yes Patient able to express need for assistance with ADLs?: Yes Does the patient have difficulty dressing or bathing?: Yes Independently performs ADLs?: No Does the patient have difficulty walking or climbing stairs?: Yes Weakness of Legs: Both Weakness of Arms/Hands: Both  Permission Sought/Granted   Permission granted to share information with : Yes, Verbal Permission Granted              Emotional Assessment Appearance:: Appears stated age Attitude/Demeanor/Rapport: Gracious Affect (typically observed): Accepting Orientation: : Oriented to Self, Oriented to Place, Oriented to  Time, Oriented to Situation Alcohol / Substance Use: Not Applicable Psych Involvement: No (comment)  Admission diagnosis:  Dehydration [E86.0] Syncope [R55] Generalized weakness [R53.1] Pancytopenia ([place]) [D61.818] Patient Active Problem List   Diagnosis Date Noted   NHL (non-Hodgkin's lymphoma) ([place]) 09/03/2021   Syncope 09/03/2021  Acquired thrombophilia ([place]) 09/03/2021   Loss of appetite 09/03/2021   Moderate recurrent major depression ([place]) 09/03/2021   Other specified disorders of bone density and structure, other site 09/03/2021   Rectocele 09/03/2021   Prediabetes 09/03/2021   Pancytopenia ([place]) 08/29/2021   Generalized weakness 08/28/2021    Splenomegaly 08/20/2021   Secondary hypercoagulable state ([place]) 04/28/2021   Chronic kidney disease, stage 3 unspecified ([place]) 03/26/2021   Hardening of the aorta (main artery of the heart) ([place]) 03/26/2021   Anemia 08/02/2020   Acute bronchitis 06/21/2020   Gait instability 08/01/2019   Hypothyroidism 04/04/2018   Long term current use of amiodarone 10/19/2017   OSA (obstructive sleep apnea) 10/19/2017   Chronic rhinitis 06/16/2017   Hoarseness 05/12/2017   Upper airway cough syndrome 03/19/2017   Rhinitis, allergic 03/19/2017   Severe recurrent major depression without psychotic features ([place]) 11/25/2016    Class: Chronic   Generalized anxiety disorder 11/25/2016    Class: Chronic   Persistent atrial fibrillation ([place]) 10/31/2016   Pruritus 10/31/2016   Primary localized osteoarthritis of left knee 09/21/2016   (HFpEF) heart failure with preserved ejection fraction ([place]) 07/06/2016   Chest pain 07/06/2016   Fatigue 07/06/2016   Dyspnea on exertion 04/03/2016   Mitral regurgitation 02/27/2016   PAF (paroxysmal atrial fibrillation) ([place]) 02/17/2016   Hyperlipidemia    Essential hypertension    Chronic cough    Gastroesophageal reflux disease without esophagitis    Bilateral carpal tunnel syndrome 11/06/2015   Cervical spondylosis without myelopathy 11/06/2015   Primary osteoarthritis of both first carpometacarpal joints 11/06/2015   Hx of colonic polyps 05/24/2014   S/P lumbar laminectomy 04/05/2013   Benign neoplasm of colon 12/29/2011   PCP:  Harlan Stains, MD Pharmacy:   [place], [place] - [address] [place] [place] [address] Phone: [phone] Fax: [phone]  [facility] - [place], [place] [address] Phone: [phone] Fax: [phone]     Social Determinants of Health (SDOH) Interventions    Readmission Risk  Interventions    09/01/2021    8:52 AM  Readmission Risk Prevention Plan  Transportation Screening Complete  PCP or Specialist Appt within 3-5 Days Complete  HRI or [place] Complete  Social Work Consult for [place] Planning/Counseling Complete  Palliative Care Screening Not Applicable  Medication Review Press photographer) Complete

## 2021-09-08 NOTE — Progress Notes (Signed)
Physical Therapy Treatment Patient Details Name: [name] [name] MRN: [id number] DOB: [birth year] Today's Date: 09/08/2021   History of Present Illness Pt admitted from home 2* weakness, syncope and hemoptysis. Pt with recent dx of Non-Hodgkins Lymphoma and hx of L TKR , back surgery, afib, CKD    PT Comments    Pt AxO x 3 very pleasant Lady with Spouse and Son in room.  Pt was OOB in recliner with B LE elevated.  Assisted with amb to bathroom.  General transfer comment: does well using B UE's to staedy self.  Also able to self perform all toileting needs. General Gait Details: pt only amb to and from bathroom only due to MAX c/o fatigue.  Pt stated she received the Lasix as was getting up/down to bathroom alot today. Pt plans to return home once she receives her [facility].    Recommendations for follow up therapy are one component of a multi-disciplinary discharge planning process, led by the attending physician.  Recommendations may be updated based on patient status, additional functional criteria and insurance authorization.  Follow Up Recommendations  Home health PT     Assistance Recommended at Discharge Intermittent Supervision/Assistance  Patient can return home with the following A little help with walking and/or transfers;A little help with bathing/dressing/bathroom;Assistance with cooking/housework;Assist for transportation;Help with stairs or ramp for entrance   Equipment Recommendations  None recommended by PT    Recommendations for Other Services       Precautions / Restrictions Precautions Precautions: Fall Restrictions Weight Bearing Restrictions: No     Mobility  Bed Mobility               General bed mobility comments: OOB in recliner    Transfers Overall transfer level: Needs assistance Equipment used: Rolling [place] (2 wheels) Transfers: Sit to/from Stand Sit to Stand: Supervision, Min guard           General transfer comment: does well using B  UE's to staedy self.  Also able to self perform all toileting needs.    Ambulation/Gait Ambulation/Gait assistance: Supervision, Min guard Gait Distance (Feet): 24 Feet Assistive device: Rolling [place] (2 wheels) Gait Pattern/deviations: Step-through pattern, Decreased step length - right, Decreased step length - left, Shuffle, Trunk flexed Gait velocity: decreased     General Gait Details: pt only amb to and from bathroom only due to MAX c/o fatigue.  Pt stated she received the Lasix as was getting up/down to bathroom alot today.   Stairs             Wheelchair Mobility    Modified Rankin (Stroke Patients Only)       Balance                                            Cognition Arousal/Alertness: Awake/alert Behavior During Therapy: WFL for tasks assessed/performed Overall Cognitive Status: Within Functional Limits for tasks assessed                                 General Comments: AxO x 3 very sweet Lady and eager to go home after she receives her [place] cath.        Exercises      General Comments        Pertinent Vitals/Pain Pain Assessment Pain Assessment: Faces Faces  Pain Scale: Hurts a little bit Pain Location: B LE edema Pain Descriptors / Indicators: Tightness Pain Intervention(s): Monitored during session, Repositioned    Home Living                          Prior Function            PT Goals (current goals can now be found in the care plan section) Progress towards PT goals: Progressing toward goals    Frequency    Min 3X/week      PT Plan Current plan remains appropriate    Co-evaluation              AM-PAC PT "6 Clicks" Mobility   Outcome Measure  Help needed turning from your back to your side while in a flat bed without using bedrails?: None Help needed moving from lying on your back to sitting on the side of a flat bed without using bedrails?: None Help needed moving to  and from a bed to a chair (including a wheelchair)?: None Help needed standing up from a chair using your arms (e.g., wheelchair or bedside chair)?: None Help needed to walk in hospital room?: A Little Help needed climbing 3-5 steps with a railing? : A Little 6 Click Score: 22    End of Session Equipment Utilized During Treatment: Gait belt Activity Tolerance: Patient limited by fatigue Patient left: in chair;with call bell/phone within reach;with family/visitor present Nurse Communication: Mobility status PT Visit Diagnosis: Other abnormalities of gait and mobility (R26.89);Muscle weakness (generalized) (M62.81);Difficulty in walking, not elsewhere classified (R26.2)     Time: 3614-4315 PT Time Calculation (min) (ACUTE ONLY): 12 min  Charges:  $Gait Training: 8-22 mins                     Rica Koyanagi  PTA [place] Office M-F          [phone] Weekend pager [phone]

## 2021-09-08 NOTE — Progress Notes (Addendum)
HEMATOLOGY-ONCOLOGY PROGRESS NOTE  ASSESSMENT AND PLAN: Large B-cell lymphoma presenting with macrocytic anemia/thrombocytopenia -08/13/2020 B12 3500 -08/13/2020 LDH 612 -08/13/2020 myeloma panel-no monoclonal protein  -Bone marrow biopsy 08/20/2020-hypercellular marrow with erythroid hyperplasia and dyspoiesis, rare lipogranuloma like lesions.  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  Negative myeloma FISH panel, 46XX; neotype myeloid disorders profile NGS sequencing-no pathogenic mutations detected in any of the genes on the NGS panel -PNH screen - 08/26/2020 -CTs 02/17/2021-new splenomegaly; diffuse bilateral bronchial wall thickening; clustered groundglass and fine nodular opacity in the dependent right lower lobe.  CAD. -Bone marrow biopsy 08/13/2021-hypercellular bone marrow for age with trilineage hematopoiesis; several atypical lymphoid aggregates present; no increase in blastic cells; flow cytometry without significant T or B-cell abnormalities; normal cytogenetics; "the limited morphologic and immunohistochemical features are atypical and a lymphoproliferative process is not excluded" -PET scan 2/50/0370-[id number] hypermetabolic lymphadenopathy in the neck, left axilla, mediastinum, retroperitoneum, and left pelvis.  Hypermetabolic skeletal lesions, generalized bilateral pulmonary hypermetabolism -08/30/2021-1 unit RBCs -Excisional lymph node biopsy left groin 08/30/2021-Large B-cell lymphoma, CD20 positive, CD30 positive -Cycle 1 R-CHOP 09/05/2021, Granix 09/06/2021 for 7 days Fatigue Dyspnea on exertion Unsteady gait/balance disorder, followed by Dr. Tomi Likens CKD Atrial fibrillation Hypertension Sleep apnea Anorexia/weight loss 06/13/2021 skin rash, question vasculitis-course of prednisone initiated; 06/20/2021 marked improvement, rash recurred when she was tapered off of prednisone, prednisone resumed by dermatology Coagulopathy-most likely related to malnutrition and  apixaban  [name] [name] now at day 4 following cycle 1 R-CHOP.  She tolerated the chemotherapy without acute toxicity.  She continues prednisone and G-CSF.  She is due to complete the prednisone on Tuesday.  She will continue on Granix for a total of 7 days.  We will arrange for G-CSF therapy at the cancer center.  If discharged today, please make sure she gets a dose of Granix before she leaves.   Recommendations: 1.  Continue G-CSF and prednisone  2.  Continue allopurinol 3.  Increase ambulation 4.  Port-A-Cath placement per interventional radiology, hopefully on 09/08/2021 5.  She appears stable for discharge later today if Port-A-Cath is placed, she will continue allopurinol for a total of 7 days and G-CSF for a total of 7 days, we can arrange for G-CSF therapy at the cancer center.  [name] was interviewed and examined.  She appears stable.  She continues to have leg edema.  The white count and platelets are adequate.  She is scheduled undergo Port-A-Cath placement today.  The plan is to continue G-CSF, prednisone, and allopurinol.  Outpatient follow-up will be scheduled at the Cancer center for G-CSF and an office visit this week.  She will remain off of apixaban for now.  We will resume apixaban if the platelet count remains above 100,000.  I was present for greater than 50% of today's visit.  I performed medical decision making.  SUBJECTIVE: No new complaints this morning.  Awaiting Port-A-Cath placement.   PHYSICAL EXAMINATION:  Vitals:   09/07/21 2219 09/08/21 0629  BP: (!) 111/55 (!) 120/56  Pulse: (!) 55 (!) 47  Resp: 18 16  Temp: (!) 97.5 F (36.4 C)   SpO2: 100% 100%   Filed Weights   09/07/21 0500 09/07/21 0700 09/08/21 0629  Weight: 93.8 kg 92.5 kg 93.5 kg    Intake/Output from previous day: 08/27 0701 - 08/28 0700 In: 363 [P.O.:360; I.V.:3] Out: -   Physical examination:  HEENT-no thrush, single petechiae at the tip of the  tongue Cardiovascular-the rate and rhythm Lungs-clear bilaterally Vascular-trace  bilateral lower extremity edema   LABORATORY DATA:  I have reviewed the data as listed    Latest Ref Rng & Units 09/08/2021    6:15 AM 09/07/2021    6:30 AM 09/06/2021    9:57 AM  CMP  Glucose 70 - 99 mg/dL 114  120  196   BUN 8 - 23 mg/dL 33  27  25   Creatinine 0.44 - 1.00 mg/dL 1.12  1.03  0.96   Sodium 135 - 145 mmol/L 140  142  142   Potassium 3.5 - 5.1 mmol/L 3.8  3.9  3.9   Chloride 98 - 111 mmol/L 113  117  117   CO2 22 - 32 mmol/L _0 Calcium 8.9 - 10.3 mg/dL 8.2  8.3  7.9     Lab Results  Component Value Date   WBC 7.8 09/08/2021   HGB 9.0 (L) 09/08/2021   HCT 28.3 (L) 09/08/2021   MCV 95.6 09/08/2021   PLT 94 (L) 09/08/2021   NEUTROABS 6.9 09/08/2021    No results found for: "CEA1", "CEA", "CAN199", "CA125", "PSA1"  VAS US CAROTID  Result Date: 09/04/2021 Carotid Arterial Duplex Study Patient Name:  [name] [name]  Date of Exam:   09/04/2021 Medical Rec #: [id number]      Accession #:    [id number] Date of Birth: June 18, 1945       Patient Gender: F Patient Age:   76 years Exam Location:  [facility] Procedure:      VAS US CAROTID Referring Phys: DAVID ORTIZ --------------------------------------------------------------------------------  Indications:       Syncope. Risk Factors:      Hypertension, hyperlipidemia. Limitations        Today's exam was limited due to the patient's respiratory                    variation. Comparison Study:  No prior studies. Performing Technologist: Oliver Hum RVT  Examination Guidelines: A complete evaluation includes B-mode imaging, spectral Doppler, color Doppler, and power Doppler as needed of all accessible portions of each vessel. Bilateral testing is considered an integral part of a complete examination. Limited examinations for reoccurring indications may be performed as noted.  Right Carotid Findings:  +----------+--------+--------+--------+-----------------------+--------+           PSV cm/sEDV cm/sStenosisPlaque Description     Comments +----------+--------+--------+--------+-----------------------+--------+ CCA Prox  92      12                                     tortuous +----------+--------+--------+--------+-----------------------+--------+ CCA Distal73      14              smooth and heterogenous         +----------+--------+--------+--------+-----------------------+--------+ ICA Prox  74      16              smooth and heterogenous         +----------+--------+--------+--------+-----------------------+--------+ ICA Distal65      14                                     tortuous +----------+--------+--------+--------+-----------------------+--------+ ECA       89      4                                               +----------+--------+--------+--------+-----------------------+--------+ +----------+--------+-------+--------+-------------------+  PSV cm/sEDV cmsDescribeArm Pressure (mmHG) +----------+--------+-------+--------+-------------------+ [id number]                                        +----------+--------+-------+--------+-------------------+ +---------+--------+--+--------+-+---------+ VertebralPSV cm/s57EDV cm/s8Antegrade +---------+--------+--+--------+-+---------+  Left Carotid Findings: +----------+-------+-------+--------+---------------------------------+--------+           PSV    EDV    StenosisPlaque Description               Comments           cm/s   cm/s                                                     +----------+-------+-------+--------+---------------------------------+--------+ CCA Prox  95     14                                                       +----------+-------+-------+--------+---------------------------------+--------+ CCA Distal77     14             smooth and heterogenous                    +----------+-------+-------+--------+---------------------------------+--------+ ICA Prox  86     19             calcific, irregular and          tortuous                                 heterogenous                              +----------+-------+-------+--------+---------------------------------+--------+ ICA Distal78     15                                              tortuous +----------+-------+-------+--------+---------------------------------+--------+ ECA       151    14                                                       +----------+-------+-------+--------+---------------------------------+--------+ +----------+--------+--------+--------+-------------------+           PSV cm/sEDV cm/sDescribeArm Pressure (mmHG) +----------+--------+--------+--------+-------------------+ [id number]                                         +----------+--------+--------+--------+-------------------+ +---------+--------+--+--------+--+---------+ VertebralPSV cm/s54EDV cm/s12Antegrade +---------+--------+--+--------+--+---------+   Summary: Right Carotid: Velocities in the right ICA are consistent with a 1-39% stenosis. Left Carotid: Velocities in the left ICA are consistent with a 1-39% stenosis. Vertebrals: Bilateral vertebral arteries demonstrate antegrade flow. *See table(s) above for measurements and observations.  Electronically signed by Monica Martinez MD on 09/04/2021 at  1:02:46 PM.    Final    ECHOCARDIOGRAM COMPLETE  Result Date: 09/04/2021    ECHOCARDIOGRAM REPORT   Patient Name:   [name] [name] Date of Exam: 09/04/2021 Medical Rec #:  [id number]     Height:       67.0 in Accession #:    [id number]    Weight:       197.8 lb Date of Birth:  1945-10-22      BSA:          2.013 m Patient Age:    53 years      BP:           115/51 mmHg Patient Gender: F             HR:           83 bpm. Exam Location:  Inpatient Procedure: 2D Echo, Cardiac Doppler  and Color Doppler Indications:    Chemotherapy  History:        Patient has prior history of Echocardiogram examinations, most                 recent 08/09/2020. Risk Factors:Sleep Apnea, Diabetes and                 Hypertension.  Sonographer:    Jefferey Pica Referring Phys: Barry  1. Left ventricular ejection fraction, by estimation, is 65 to 70%. The left ventricle has normal function. The left ventricle has no regional wall motion abnormalities. Left ventricular diastolic parameters were normal.  2. Right ventricular systolic function is normal. The right ventricular size is normal. There is moderately elevated pulmonary artery systolic pressure.  3. The mitral valve is degenerative. Mild mitral valve regurgitation. Moderate mitral annular calcification.  4. The aortic valve is tricuspid. There is mild calcification of the aortic valve. There is mild thickening of the aortic valve. Aortic valve regurgitation is not visualized. Aortic valve sclerosis is present, with no evidence of aortic valve stenosis.  5. The inferior vena cava is normal in size with greater than 50% respiratory variability, suggesting right atrial pressure of 3 mmHg. Comparison(s): No significant change from prior study. FINDINGS  Left Ventricle: Left ventricular ejection fraction, by estimation, is 65 to 70%. The left ventricle has normal function. The left ventricle has no regional wall motion abnormalities. The left ventricular internal cavity size was normal in size. There is  no left ventricular hypertrophy. Left ventricular diastolic parameters were normal. Right Ventricle: The right ventricular size is normal. No increase in right ventricular wall thickness. Right ventricular systolic function is normal. There is moderately elevated pulmonary artery systolic pressure. The tricuspid regurgitant velocity is 3.62 m/s, and with an assumed right atrial pressure of 5 mmHg, the estimated right ventricular  systolic pressure is 63.3 mmHg. Left Atrium: Left atrial size was normal in size. Right Atrium: Right atrial size was normal in size. Pericardium: There is no evidence of pericardial effusion. Mitral Valve: The mitral valve is degenerative in appearance. There is mild thickening of the mitral valve leaflet(s). There is mild calcification of the mitral valve leaflet(s). Moderate mitral annular calcification. Mild mitral valve regurgitation. Tricuspid Valve: The tricuspid valve is normal in structure. Tricuspid valve regurgitation is mild. Aortic Valve: The aortic valve is tricuspid. There is mild calcification of the aortic valve. There is mild thickening of the aortic valve. Aortic valve regurgitation is not visualized. Aortic valve sclerosis is present, with no evidence of aortic valve stenosis. Aortic valve  peak gradient measures 12.1 mmHg. Pulmonic Valve: The pulmonic valve was normal in structure. Pulmonic valve regurgitation is trivial. Aorta: The aortic root and ascending aorta are structurally normal, with no evidence of dilitation. Venous: The inferior vena cava is normal in size with greater than 50% respiratory variability, suggesting right atrial pressure of 3 mmHg. IAS/Shunts: The atrial septum is grossly normal.  LEFT VENTRICLE PLAX 2D LVIDd:         4.20 cm   Diastology LVIDs:         2.30 cm   LV e' medial:    7.88 cm/s LV PW:         1.10 cm   LV E/e' medial:  16.6 LV IVS:        1.00 cm   LV e' lateral:   9.08 cm/s LVOT diam:     1.80 cm   LV E/e' lateral: 14.4 LV SV:         88 LV SV Index:   43 LVOT Area:     2.54 cm  RIGHT VENTRICLE RV S prime:     19.20 cm/s TAPSE (M-mode): 2.6 cm LEFT ATRIUM             Index        RIGHT ATRIUM           Index LA diam:        4.50 cm 2.24 cm/m   RA Area:     17.30 cm LA Vol (A2C):   57.4 ml 28.52 ml/m  RA Volume:   45.00 ml  22.36 ml/m LA Vol (A4C):   54.0 ml 26.83 ml/m LA Biplane Vol: 57.5 ml 28.57 ml/m  AORTIC VALVE                  PULMONIC VALVE AV  Area (Vmax): 2.33 cm      PV Vmax:       1.12 m/s AV Vmax:        174.00 cm/s   PV Peak grad:  5.0 mmHg AV Peak Grad:   12.1 mmHg LVOT Vmax:      159.00 cm/s LVOT Vmean:     108.000 cm/s LVOT VTI:       0.344 m  AORTA Ao Root diam: 3.10 cm Ao Asc diam:  3.40 cm MITRAL VALVE                TRICUSPID VALVE MV Area (PHT): 3.65 cm     TR Peak grad:   52.4 mmHg MV Decel Time: 208 msec     TR Vmax:        362.00 cm/s MV E velocity: 131.00 cm/s MV A velocity: 38.60 cm/s   SHUNTS MV E/A ratio:  3.39         Systemic VTI:  0.34 m                             Systemic Diam: 1.80 cm Gwyndolyn Kaufman MD Electronically signed by Gwyndolyn Kaufman MD Signature Date/Time: 09/04/2021/11:09:03 AM    Final    [place] EKG SITE RITE  Result Date: 09/03/2021 If Site Rite image not attached, placement could not be confirmed due to current cardiac rhythm.  DG Chest Portable 1 View  Result Date: 09/03/2021 CLINICAL DATA:  Provided history: Hematemesis. Additional history provided: Weakness, recent diagnosis of lymphoma. EXAM: PORTABLE CHEST 1 VIEW COMPARISON:  Head CT 08/27/2021. Prior chest radiographs 04/20/2021 and  earlier. FINDINGS: Heart size at the upper limits of normal. Minimal atelectasis within the left lung base. No appreciable airspace consolidation or pulmonary edema. No evidence of pleural effusion or pneumothorax. No acute bony abnormality identified. Dextrocurvature of the thoracic spine. IMPRESSION: Minimal atelectasis within the left lung base. Otherwise, no evidence of acute cardiopulmonary abnormality. Electronically Signed   By: Kellie Simmering D.O.   On: 09/03/2021 12:20   NM PET Image Initial (PI) Skull Base To Thigh  Result Date: 08/29/2021 CLINICAL DATA:  Initial treatment strategy for progressive pancytopenia. Severe fatigue. Anorexia with weight loss. Night sweats. EXAM: NUCLEAR MEDICINE PET SKULL BASE TO THIGH TECHNIQUE: 9.1 mCi F-18 FDG was injected intravenously. Full-ring PET imaging was performed from  the skull base to thigh after the radiotracer. CT data was obtained and used for attenuation correction and anatomic localization. Fasting blood glucose: 99 mg/dl COMPARISON:  02/17/2021 CT chest, abdomen and pelvis. FINDINGS: Mediastinal blood pool activity: SUV max 2.7 Liver activity: SUV max 4.3 NECK: Several small hypermetabolic bilateral neck lymph nodes. Representative left level 2 neck 0.5 cm node with max SUV 10.5 (series 4/image 34). Representative 0.6 cm right level 3 neck node with max SUV 13.8 (series 4/image 38). Incidental CT findings: None. CHEST: Hypermetabolic left axillary lymphadenopathy with dominant 1.0 cm left axillary node with max SUV 23.6 (series 4/image 59). No enlarged or hypermetabolic right axillary nodes. Several mildly enlarged hypermetabolic bilateral mediastinal lymph nodes. Representative 1.1 cm right paratracheal node with max SUV 14.2 (series 4/image 65). Representative 1.0 cm subcarinal node with max SUV 29.5 (series 4/image 75). Hypermetabolic left hilar adenopathy with max SUV 7.4. No hypermetabolic right hilar adenopathy. Generalized hypermetabolism throughout the bilateral lung fields with associated faint ground-glass opacity in both lungs with representative max SUV 5.1 in the left lower lobe. Incidental CT findings: Coronary atherosclerosis. Atherosclerotic nonaneurysmal thoracic aorta. No significant pulmonary nodules. ABDOMEN/PELVIS: Widespread hypermetabolic lymphadenopathy in the porta hepatis, portacaval, aortocaval, left para-aortic, mesenteric, left common iliac, left external iliac and left inguinal nodal chains. Representative 1.3 cm porta hepatis node with max SUV 13.0 (series 5/image 115). Representative 1.3 cm aortocaval node with max SUV 25.7 (series 4/image 141). Representative 1.5 cm left para-aortic node with max SUV 26.7 (series 4/image 138). Representative 1.0 cm right mesenteric node with max SUV 21.2 (series 4/image 145). Representative 1.3 cm left  external iliac node with max SUV 15.8 (series 4/image 172). Representative 1.0 cm left inguinal node with max SUV 15.6 (series 4/image 187). Mild-to-moderate splenomegaly. Craniocaudal splenic length 16.3 cm. Splenic FDG uptake is equivalent to the liver with max SUV 4.4. No focal splenic masses. No abnormal hypermetabolic activity within the liver, pancreas or adrenal glands. Incidental CT findings: Postsurgical changes from fundoplication in the proximal stomach. Atherosclerotic nonaneurysmal abdominal aorta. Hysterectomy. SKELETON: Multiple hypermetabolic skeletal lesions in the axial and visualized proximal appendicular skeleton. Representative right T11 vertebral/posterior element lesion with max SUV 15.4. Representative proximal left humerus metaphysis lesion with max SUV 5.0. Representative left mandibular body lesion with max SUV 6.7. Incidental CT findings: None. IMPRESSION: 1. Widespread intensely hypermetabolic lymphadenopathy in the neck, left axilla, mediastinum, retroperitoneum and left pelvis. Mild-to-moderate splenomegaly. Scattered hypermetabolic skeletal lesions. Generalized bilateral pulmonary hypermetabolism with associated faint ground-glass opacity. Findings are most compatible with lymphoproliferative disorder/lymphoma. Deauville score 5. 2. Chronic findings include: Aortic Atherosclerosis (ICD10-I70.0). Coronary atherosclerosis. Electronically Signed   By: Ilona Sorrel M.D.   On: 08/29/2021 10:09   IR BONE MARROW BIOPSY & ASPIRATION  Result Date: 08/13/2021 INDICATION: pancytopenia,  sweats, splenomegaly, anorexia EXAM: Bone marrow aspiration and core biopsy using fluoroscopic guidance MEDICATIONS: None. ANESTHESIA/SEDATION: Moderate (conscious) sedation was employed during this procedure. A total of Versed 2 mg and Fentanyl 100 mcg was administered intravenously. Moderate Sedation Time: 10 minutes. The patient's level of consciousness and vital signs were monitored continuously by  radiology nursing throughout the procedure under my direct supervision. FLUOROSCOPY TIME:  Fluoroscopy Time: 1.8 minutes (18 mGy) COMPLICATIONS: None immediate. PROCEDURE: Informed written consent was obtained from the patient after a thorough discussion of the procedural risks, benefits and alternatives. All questions were addressed. Maximal Sterile Barrier Technique was utilized including caps, mask, sterile gowns, sterile gloves, sterile drape, hand hygiene and skin antiseptic. A timeout was performed prior to the initiation of the procedure. The patient was placed prone on the IR exam table. Limited fluoroscopy of the pelvis was performed for planning purposes. Skin entry site was marked, and the overlying skin was prepped and draped in the standard sterile fashion. Local analgesia was obtained with 1% lidocaine. Using fluoroscopic guidance, an 11 gauge needle was advanced just deep to the cortex of the right posterior ilium. Subsequently, bone marrow aspiration and core biopsy were performed. Specimens were submitted to lab/pathology for handling. Hemostasis was achieved with manual pressure, and a clean dressing was placed. The patient tolerated the procedure well without immediate complication. IMPRESSION: Successful bone marrow aspiration and core biopsy of the right posterior ilium using fluoroscopic guidance. Electronically Signed   By: Albin Felling M.D.   On: 08/13/2021 14:00     Future Appointments  Date Time Provider [place]  09/22/2021 12:00 PM [facility]  10/09/2021  9:00 AM Jerline Pain, MD [facility]      LOS: 4 days

## 2021-09-08 NOTE — Care Management Important Message (Signed)
Important Message  Patient Details IM Letter given to the Patient. Name: [name] [name] MRN: [id number] Date of Birth: [birth year]   Medicare Important Message Given:  Yes     Kerin Salen 09/08/2021, 9:52 AM

## 2021-09-08 NOTE — Progress Notes (Signed)
The following biosimilar Zarxio ([id number]) has been selected for use in this patient.

## 2021-09-08 NOTE — Progress Notes (Signed)
PROGRESS NOTE   [name] [name]  QMG:[id number]    DOB: [birth year]    DOA: 09/03/2021  PCP: Harlan Stains, MD   I have briefly reviewed patients previous medical records in [facility].  Chief Complaint  Patient presents with   Weakness    Brief Narrative:  76 year old female with PMH of persistent A-fib, stage III CKD, anxiety and depression, GERD, HLD, HTN, prediabetes, OSA on no CPAP, hypothyroidism, recent hospital admission 08/28/2021 - 09/01/2021 for generalized weakness, poor appetite, nightly chills over the last year and a half, and a 40 pound weight loss, underwent lymph node biopsy, newly diagnosed with diffuse large B cell lymphoma, seen in oncology office on 8/22 with plans to initiate systemic chemotherapy with R-CHOP next week, readmitted to the hospital on 09/03/2021 due to worsening fatigue, weakness and a syncopal episode while in the ED.  Oncology consulted and started first cycle of R-CHOP in the hospital on 8/25.  Awaiting Port-A-Cath placement by IR, then DC home possibly later today if done early or tomorrow.   Assessment & Plan:  Principal Problem:   Syncope Active Problems:   Generalized anxiety disorder   PAF (paroxysmal atrial fibrillation) (HCC)   Hyperlipidemia   Essential hypertension   Gastroesophageal reflux disease without esophagitis   (HFpEF) heart failure with preserved ejection fraction (HCC)   OSA (obstructive sleep apnea)   Hypothyroidism   Pancytopenia (HCC)   NHL (non-Hodgkin's lymphoma) (HCC)   Prediabetes   Large B cell lymphoma: History as noted above.  New diagnosis.  Final pathology from inguinal lymph node biopsy confirms diffuse large B-cell lymphoma.  Oncology input appreciated and S/P first cycle of R-CHOP in the hospital on 8/25.  Prednisone 60 Mg daily x4 days.  Continue allopurinol.  Serum uric acid 5.2 > 4.2 >4.5.  LDH 929.  Dr. Learta Codding, oncology follow-up appreciated, has cleared for DC home on 8/28 pending Port-A-Cath placement  by IR.  Continue total 7 days of allopurinol, total 7 days of G-CSF arranged through the cancer center.  Intermittent night sweats present.  Pancytopenia: Secondary to lymphoma.  Follow CBCs closely.  Transfuse PRBC if hemoglobin 7 g or less, platelets if counts less than 20 K or bleeding .  Starting Granix 8/26.  Hemoglobin down to 6.8 and WBC 1.4 on 8/26.  S/p 2 units PRBC, hemoglobin up to 9.2 > 8.5 >9.  WBC up from 1.4 > 4.5 > 7.8 after Granix initiation.  Hemoptysis: Suspect low volume, "spit up some blood".  Unclear etiology.  Is on apixaban and also has thrombocytopenia as above.  No further episodes.  As discussed with Dr. Benay Spice, expect thrombocytopenia to worsen after chemo and risks of bleeding outweigh benefits of apixaban anticoagulation for stroke prophylaxis, hence discontinued Eliquis for now.  No further episodes.  As discussed with spouse at bedside, appears to be minimal.  Will need to discuss with Dr. Benay Spice regarding timing of resumption of Eliquis.  Syncope: Reportedly occurred in ED triage on 8/23.  Bolused with 2 L of IV saline.  HS Troponin x2 negative at 11 > 9.  TTE: LVEF 65-70%, no aortic stenosis.  No significant change from prior study.  CUS: Bilateral ICA consistent with 1-39% stenosis.  Check orthostatics.  If patient drives, should be advised not to drive for 6 months.  Telemetry had shown sinus rhythm without arrhythmias or pauses  Anxiety disorder: Continue home dose of Remeron, bupropion and alprazolam.  Persistent atrial fibrillation: CHA2DS2-VASc score: 6.  Continue amiodarone, Toprol-XL.  Eliquis discontinued for now and timing of reinitiation to be determined after discussing with oncology..  Currently in sinus rhythm.  Off telemetry.  Hyperlipidemia: Continue pravastatin  Essential hypertension: Controlled on metoprolol succinate, continue  GERD without esophagitis: Continue pantoprazole  Chronic diastolic CHF: Lower extremity edema but no  respiratory symptoms.  Discontinued IV fluids.  Added lower extremity compression stockings but patient does not want to wear these due to discomfort and asking for Lasix.  Lower extremity edema due to IVF, hypoalbuminemia.  Low-dose Lasix started.  Still with substantial lower extremity edema, urinating well, continue low-dose Lasix.  OSA, not on CPAP Noted  Hypothyroidism TSH 0.149 in the context of acute illness.  Clinically euthyroid.  Check free T3 and free T4, will likely resume prior home dose of Synthroid.  Prediabetes A1c 5.8 in 2018.  Good glycemic control since hospital admission.  DC'd CBGs.  Outpatient follow-up.  Acute kidney injury complicating chronic kidney disease stage II Baseline creatinine initially felt to be in the 1.4-1.6 range.  However creatinine has steadily decreased down to 0.96 since admission.  Thereby not sure about above baseline.  Currently I feel she has stage II CKD and not stage III.  Follow BMP closely as outpatient at the cancer center. Elevated lactate/lactic acidosis: Resolved.  Constipation Initiated bowel regimen.  Body mass index is 32.28 kg/m.   DVT prophylaxis: Place TED hose Start: 09/06/21 1636 SCDs Start: 09/03/21 1459.     Code Status: DNR:  Family Communication: Daughter at bedside Disposition:  Status is: Inpatient Hopeful DC home 8/28 or 8/29, pending Port-A-Cath placement by IR     Consultants:   Medical oncology IR  Procedures:     Antimicrobials:      Subjective:  Urinated well last night.  Feels that leg swelling improving.  Still having night sweats, unchanged.  Objective:   Vitals:   09/07/21 0700 09/07/21 1435 09/07/21 2219 09/08/21 0629  BP:  (!) 144/55 (!) 111/55 (!) 120/56  Pulse:  (!) 57 (!) 55 (!) 47  Resp:  '16 18 16  '$ Temp:   (!) 97.5 F (36.4 C)   TempSrc:   Oral   SpO2:  100% 100% 100%  Weight: 92.5 kg   93.5 kg  Height:        General exam: Elderly female, small built, frail and  chronically ill sitting up comfortably in reclining chair with her daughter at her side.  Appears to be in good spirits. Respiratory system: Clear to auscultation.  No increased work of breathing. Cardiovascular system: S1 and S2 heard, RRR.  No JVD or murmurs.  2+ pitting bilateral leg edema, not tense. Gastrointestinal system: Abdomen is nondistended, soft and nontender. No organomegaly or masses felt. Normal bowel sounds heard. Central nervous system: Alert and oriented. No focal neurological deficits. Extremities: Symmetric 5 x 5 power. Skin: No rashes, lesions or ulcers Psychiatry: Judgement and insight appear normal. Mood & affect: More cheerful today.   Data Reviewed:   I have personally reviewed following labs and imaging studies   CBC: Recent Labs  Lab 09/06/21 0957 09/06/21 2230 09/07/21 0630 09/08/21 0615  WBC 1.4*  --  4.5 7.8  NEUTROABS 1.0*  --  3.9 6.9  HGB 6.8* 9.2* 8.5* 9.0*  HCT 22.4* 29.1* 26.9* 28.3*  MCV 97.0  --  94.4 95.6  PLT 64*  --  78* 94*    Basic Metabolic Panel: Recent Labs  Lab 09/03/21 1224 09/04/21 0340 09/05/21 0410 09/06/21 0957 09/07/21 0630 09/08/21  0615  NA  --  140 140 142 142 140  K  --  4.0 3.8 3.9 3.9 3.8  CL  --  114* 114* 117* 117* 113*  CO2  --  22 21* 20* 21* 23  GLUCOSE  --  129* 108* 196* 120* 114*  BUN  --  26* 20 25* 27* 33*  CREATININE  --  1.30* 1.06* 0.96 1.03* 1.12*  CALCIUM  --  8.0* 7.8* 7.9* 8.3* 8.2*  MG 1.8  --   --   --   --   --   PHOS  --   --   --  3.9 4.1  --     Liver Function Tests: Recent Labs  Lab 09/02/21 1352 09/04/21 0340  AST 54* 43*  ALT 19 34  ALKPHOS 72 73  BILITOT 1.0 0.8  PROT 4.8* 4.3*  ALBUMIN 2.5* 1.9*    CBG: Recent Labs  Lab 09/03/21 1158 09/04/21 0745 09/04/21 1158  GLUCAP 102* 87 119*    Microbiology Studies:   Recent Results (from the past 240 hour(s))  SARS Coronavirus 2 by RT PCR (hospital order, performed in [facility] hospital lab) *cepheid single result  test* Anterior Nasal Swab     Status: None   Collection Time: 09/03/21  2:22 PM   Specimen: Anterior Nasal Swab  Result Value Ref Range Status   SARS Coronavirus 2 by RT PCR NEGATIVE NEGATIVE Final    Comment: (NOTE) SARS-CoV-2 target nucleic acids are NOT DETECTED.  The SARS-CoV-2 RNA is generally detectable in upper and lower respiratory specimens during the acute phase of infection. The lowest concentration of SARS-CoV-2 viral copies this assay can detect is 250 copies / mL. A negative result does not preclude SARS-CoV-2 infection and should not be used as the sole basis for treatment or other patient management decisions.  A negative result may occur with improper specimen collection / handling, submission of specimen other than nasopharyngeal swab, presence of viral mutation(s) within the areas targeted by this assay, and inadequate number of viral copies (<250 copies / mL). A negative result must be combined with clinical observations, patient history, and epidemiological information.  Fact Sheet for Patients:   https://www.patel.info/  Fact Sheet for Healthcare Providers: https://hall.com/  This test is not yet approved or  cleared by the [place] FDA and has been authorized for detection and/or diagnosis of SARS-CoV-2 by FDA under an Emergency Use Authorization (EUA).  This EUA will remain in effect (meaning this test can be used) for the duration of the COVID-19 declaration under Section 564(b)(1) of the Act, 21 U.S.C. section 360bbb-3(b)(1), unless the authorization is terminated or revoked sooner.  Performed at [facility], [place] [address] [place], [place] [address]     Radiology Studies:  No results found.  Scheduled Meds:    allopurinol  150 mg Oral Daily   amiodarone  200 mg Oral Daily   bisacodyl  10 mg Oral Once   buPROPion  150 mg Oral Daily   Chlorhexidine Gluconate Cloth  6 each  Topical Daily   furosemide  20 mg Oral Daily   levothyroxine  75 mcg Oral Q0600   loratadine  10 mg Oral Daily   magnesium oxide  200 mg Oral Daily   metoprolol succinate  25 mg Oral QHS   mirtazapine  30 mg Oral QHS   pantoprazole  40 mg Oral Daily   polyethylene glycol  17 g Oral Daily   pravastatin  40  mg Oral QHS   predniSONE  60 mg Oral Q breakfast   sodium chloride flush  10-40 mL Intracatheter Q12H   sodium chloride flush  3 mL Intravenous Q12H   Tbo-filgastrim (GRANIX) SQ  480 mcg Subcutaneous q1800    Continuous Infusions:       LOS: 4 days     Vernell Leep, MD,  FACP, [facility], [facility], [facility] (Care Management Physician Certified) [place]  To contact the attending provider between 7A-7P or the covering provider during after hours 7P-7A, please log into the web site [email or web address] and access using universal [place] password for that web site. If you do not have the password, please call the hospital operator.  09/08/2021, 10:04 AM

## 2021-09-09 ENCOUNTER — Telehealth: Payer: Self-pay

## 2021-09-09 ENCOUNTER — Encounter: Payer: Self-pay | Admitting: Nurse Practitioner

## 2021-09-09 ENCOUNTER — Inpatient Hospital Stay: Payer: Medicare HMO

## 2021-09-09 ENCOUNTER — Inpatient Hospital Stay (HOSPITAL_COMMUNITY): Payer: Medicare HMO

## 2021-09-09 DIAGNOSIS — C8598 Non-Hodgkin lymphoma, unspecified, lymph nodes of multiple sites: Secondary | ICD-10-CM | POA: Diagnosis not present

## 2021-09-09 HISTORY — PX: IR IMAGING GUIDED PORT INSERTION: IMG5740

## 2021-09-09 LAB — CBC WITH DIFFERENTIAL/PLATELET
Abs Immature Granulocytes: 0 10*3/uL (ref 0.00–0.07)
Band Neutrophils: 10 %
Basophils Absolute: 0 10*3/uL (ref 0.0–0.1)
Basophils Relative: 0 %
Eosinophils Absolute: 0 10*3/uL (ref 0.0–0.5)
Eosinophils Relative: 0 %
HCT: 28.1 % — ABNORMAL LOW (ref 36.0–46.0)
Hemoglobin: 8.8 g/dL — ABNORMAL LOW (ref 12.0–15.0)
Lymphocytes Relative: 5 %
Lymphs Abs: 0.4 10*3/uL — ABNORMAL LOW (ref 0.7–4.0)
MCH: 29.9 pg (ref 26.0–34.0)
MCHC: 31.3 g/dL (ref 30.0–36.0)
MCV: 95.6 fL (ref 80.0–100.0)
Monocytes Absolute: 0.1 10*3/uL (ref 0.1–1.0)
Monocytes Relative: 1 %
Neutro Abs: 8 10*3/uL — ABNORMAL HIGH (ref 1.7–7.7)
Neutrophils Relative %: 84 %
Platelets: 102 10*3/uL — ABNORMAL LOW (ref 150–400)
RBC: 2.94 MIL/uL — ABNORMAL LOW (ref 3.87–5.11)
RDW: 17.1 % — ABNORMAL HIGH (ref 11.5–15.5)
WBC: 8.5 10*3/uL (ref 4.0–10.5)
nRBC: 0 % (ref 0.0–0.2)

## 2021-09-09 LAB — BASIC METABOLIC PANEL
Anion gap: 3 — ABNORMAL LOW (ref 5–15)
BUN: 36 mg/dL — ABNORMAL HIGH (ref 8–23)
CO2: 25 mmol/L (ref 22–32)
Calcium: 8.3 mg/dL — ABNORMAL LOW (ref 8.9–10.3)
Chloride: 115 mmol/L — ABNORMAL HIGH (ref 98–111)
Creatinine, Ser: 1.21 mg/dL — ABNORMAL HIGH (ref 0.44–1.00)
GFR, Estimated: 47 mL/min — ABNORMAL LOW (ref >60)
Glucose, Bld: 112 mg/dL — ABNORMAL HIGH (ref 70–99)
Potassium: 4 mmol/L (ref 3.5–5.1)
Sodium: 143 mmol/L (ref 135–145)

## 2021-09-09 LAB — T3, FREE: T3, Free: 0.6 pg/mL — ABNORMAL LOW (ref 2.0–4.4)

## 2021-09-09 LAB — SURGICAL PATHOLOGY

## 2021-09-09 MED ORDER — MIDAZOLAM HCL 2 MG/2ML IJ SOLN
INTRAMUSCULAR | Status: AC | PRN
  Administered 2021-09-09 (×2): .5 mg via INTRAVENOUS

## 2021-09-09 MED ORDER — FUROSEMIDE 20 MG PO TABS: 20.0000 mg | ORAL_TABLET | Freq: Every day | ORAL | Status: DC

## 2021-09-09 MED ORDER — BISACODYL 10 MG RE SUPP
10.0000 mg | Freq: Once | RECTAL | Status: AC
Start: 2021-09-09 — End: 2021-09-09
  Administered 2021-09-09: 10 mg via RECTAL
  Filled 2021-09-09: qty 1

## 2021-09-09 MED ORDER — LIDOCAINE-EPINEPHRINE 1 %-1:100000 IJ SOLN
INTRAMUSCULAR | Status: AC
  Administered 2021-09-09: 5 mL
  Filled 2021-09-09: qty 1

## 2021-09-09 MED ORDER — FENTANYL CITRATE (PF) 100 MCG/2ML IJ SOLN
INTRAMUSCULAR | Status: AC
  Filled 2021-09-09: qty 2

## 2021-09-09 MED ORDER — FENTANYL CITRATE (PF) 100 MCG/2ML IJ SOLN
INTRAMUSCULAR | Status: AC | PRN
  Administered 2021-09-09 (×2): 25 ug via INTRAVENOUS

## 2021-09-09 MED ORDER — SODIUM CHLORIDE 0.9 % IV BOLUS
250.0000 mL | Freq: Once | INTRAVENOUS | Status: AC
  Administered 2021-09-09: 250 mL via INTRAVENOUS

## 2021-09-09 MED ORDER — MIDAZOLAM HCL 2 MG/2ML IJ SOLN
INTRAMUSCULAR | Status: AC
  Filled 2021-09-09: qty 2

## 2021-09-09 NOTE — Progress Notes (Addendum)
PROGRESS NOTE   [name] [name]  YJE:[id number]    DOB: April 10, 1945    DOA: 09/03/2021  PCP: Harlan Stains, MD   I have briefly reviewed patients previous medical records in [facility].  Chief Complaint  Patient presents with   Weakness    Brief Narrative:  76 year old female with PMH of persistent A-fib, stage III CKD, anxiety and depression, GERD, HLD, HTN, prediabetes, OSA on no CPAP, hypothyroidism, recent hospital admission 08/28/2021 - 09/01/2021 for generalized weakness, poor appetite, nightly chills over the last year and a half, and a 40 pound weight loss, underwent lymph node biopsy, newly diagnosed with diffuse large B cell lymphoma, seen in oncology office on 8/22 with plans to initiate systemic chemotherapy with R-CHOP next week, readmitted to the hospital on 09/03/2021 due to worsening fatigue, weakness and a syncopal episode while in the ED.  Oncology consulted and started first cycle of R-CHOP in the hospital on 8/25.  Awaiting Port-A-Cath placement by IR-has not happened today yet, likely DC home tomorrow 8/30.   Assessment & Plan:  Principal Problem:   Syncope Active Problems:   Generalized anxiety disorder   PAF (paroxysmal atrial fibrillation) (HCC)   Hyperlipidemia   Essential hypertension   Gastroesophageal reflux disease without esophagitis   (HFpEF) heart failure with preserved ejection fraction (HCC)   OSA (obstructive sleep apnea)   Hypothyroidism   Pancytopenia (HCC)   NHL (non-Hodgkin's lymphoma) (HCC)   Prediabetes   Large B cell lymphoma: History as noted above.  New diagnosis.  Final pathology from inguinal lymph node biopsy confirms diffuse large B-cell lymphoma.  Oncology input appreciated and S/P first cycle of R-CHOP in the hospital on 8/25.  Prednisone 60 Mg daily x4 days.  Continue allopurinol.  Serum uric acid 5.2 > 4.2 >4.5.  LDH 929.  Dr. Learta Codding, oncology follow-up appreciated, has cleared for DC home pending Port-A-Cath placement by IR.  IR  could not accommodate her on 8/28, supposed to have it on 8/29 but not yet done.  Continue total 7 days of allopurinol, total 7 days of G-CSF arranged through the cancer center.  Intermittent night sweats present.  Stable.  Pancytopenia: Secondary to lymphoma.  Follow CBCs closely.  Transfuse PRBC if hemoglobin 7 g or less, platelets if counts less than 20 K or bleeding .  Starting Granix 8/26.  Hemoglobin down to 6.8 and WBC 1.4 on 8/26.  S/p 2 units PRBC, hemoglobin stable in the 8-9 g range for the last couple of days.  Leukopenia resolved after Granix initiation.  Hemoptysis: Mild.  As discussed with Dr. Benay Spice, Eliquis was held due to risks of bleeding outweighing secondary stroke prevention.  Communicated with him yesterday and he recommends holding off on Plavix until outpatient follow-up with him and he will make determination to resume based on stability of thrombocytopenia.  No further hemoptysis.  Platelet counts up from 60's on admission to 102.  Syncope: Reportedly occurred in ED triage on 8/23.  Bolused with 2 L of IV saline.  HS Troponin x2 negative at 11 > 9.  TTE: LVEF 65-70%, no aortic stenosis.  No significant change from prior study.  CUS: Bilateral ICA consistent with 1-39% stenosis.  Telemetry had shown sinus rhythm without arrhythmias or pauses.  Patient reportedly has not driven for the last few months and was counseled that she should not drive for the next 6 months.  Patient and spouse verbalized understanding.  Anxiety disorder: Continue home dose of Remeron, bupropion and alprazolam.  Persistent atrial fibrillation: CHA2DS2-VASc score: 6.  Continue amiodarone, Toprol-XL.  Eliquis discontinued for now and timing of reinitiation to be determined after discussing with oncology..  Currently in sinus rhythm.  Off telemetry.  Hyperlipidemia: Continue pravastatin  Essential hypertension: Controlled on metoprolol succinate, continue  GERD without esophagitis: Continue  pantoprazole  Chronic diastolic CHF: Lower extremity edema but no respiratory symptoms.  Discontinued IV fluids.  Lower extremity edema due to IVF, hypoalbuminemia.  Low-dose Lasix 20 mg daily initiated but creatinine has slowly crept up, from 0.96 on 8/26-1.21.  Held this morning's dose of Lasix.  Follow-up BMP in a.m. to determine if Lasix can be continued.  Currently leg edema is mostly cosmetic and not bothering her and she has no cardiorespiratory symptoms.  She initially declined TED hoses but now willing to wear them.  OSA, not on CPAP Noted  Hypothyroidism TSH 0.149 in the context of acute illness.  Clinically euthyroid.  Free T4: 0.88, free T3: 0.6.  Resumed home dose of Synthroid.  Repeat TSH in 4 to 6 weeks.  Prediabetes A1c 5.8 in 2018.  Good glycemic control since hospital admission.  DC'd CBGs.  Outpatient follow-up.  Acute kidney injury complicating chronic kidney disease stage II Baseline creatinine initially felt to be in the 1.4-1.6 range.  However creatinine has steadily decreased down to 0.96 since admission.  Thereby not sure about above baseline.  Currently I feel she has stage II CKD and not stage III.  See discussion above, creatinine has slowly crept up to 1.21.  Today's dose of Lasix held, follow BMP in AM.  Elevated lactate/lactic acidosis: Resolved.  Constipation Initiated bowel regimen.  Body mass index is 31.63 kg/m.   DVT prophylaxis: Place TED hose Start: 09/06/21 1636 SCDs Start: 09/03/21 1459.     Code Status: DNR:  Family Communication: Spouse at bedside Disposition:  Status is: Inpatient DC home 8/30, pending Port-A-Cath placement by IR     Consultants:   Medical oncology IR  Procedures:   Patient has right upper arm PICC line which will need to be removed prior to discharge, after she has her Port-A-Cath placed.  Antimicrobials:      Subjective:  Leg swelling slowly improving.  Willing to wear compression stockings today.  No other  complaints reported.  Objective:   Vitals:   09/08/21 1321 09/08/21 2336 09/09/21 0618 09/09/21 1322  BP: 106/60 (!) 107/51 117/62 (!) 101/56  Pulse: (!) 49 (!) 52 (!) 48 (!) 51  Resp: '16 16 16 16  '$ Temp:  97.7 F (36.5 C) (!) 97.4 F (36.3 C)   TempSrc:  Oral Oral   SpO2: 100% 100% 100% 100%  Weight:   91.6 kg   Height:        General exam: Elderly female, small built, frail and chronically ill sitting up comfortably in bed with spouse at her side. Respiratory system: Clear to auscultation.  No increased work of breathing. Cardiovascular system: S1 and S2 heard, RRR.  No JVD or murmurs.  2+ pitting bilateral leg edema, not tense and seems to be slowly improving. Gastrointestinal system: Abdomen is nondistended, soft and nontender. No organomegaly or masses felt. Normal bowel sounds heard. Central nervous system: Alert and oriented. No focal neurological deficits. Extremities: Symmetric 5 x 5 power. Skin: No rashes, lesions or ulcers Psychiatry: Judgement and insight appear normal. Mood & affect: More cheerful today.   Data Reviewed:   I have personally reviewed following labs and imaging studies   CBC: Recent Labs  Lab 09/07/21 0630 09/08/21 0615 09/09/21 0640  WBC 4.5 7.8 8.5  NEUTROABS 3.9 6.9 8.0*  HGB 8.5* 9.0* 8.8*  HCT 26.9* 28.3* 28.1*  MCV 94.4 95.6 95.6  PLT 78* 94* 102*    Basic Metabolic Panel: Recent Labs  Lab 09/03/21 1224 09/04/21 0340 09/05/21 0410 09/06/21 0957 09/07/21 0630 09/08/21 0615 09/09/21 0640  NA  --    < > 140 142 142 140 143  K  --    < > 3.8 3.9 3.9 3.8 4.0  CL  --    < > 114* 117* 117* 113* 115*  CO2  --    < > 21* 20* 21* 23 25  GLUCOSE  --    < > 108* 196* 120* 114* 112*  BUN  --    < > 20 25* 27* 33* 36*  CREATININE  --    < > 1.06* 0.96 1.03* 1.12* 1.21*  CALCIUM  --    < > 7.8* 7.9* 8.3* 8.2* 8.3*  MG 1.8  --   --   --   --   --   --   PHOS  --   --   --  3.9 4.1  --   --    < > = values in this interval not  displayed.    Liver Function Tests: Recent Labs  Lab 09/04/21 0340  AST 43*  ALT 34  ALKPHOS 73  BILITOT 0.8  PROT 4.3*  ALBUMIN 1.9*    CBG: Recent Labs  Lab 09/03/21 1158 09/04/21 0745 09/04/21 1158  GLUCAP 102* 87 119*    Microbiology Studies:   Recent Results (from the past 240 hour(s))  SARS Coronavirus 2 by RT PCR (hospital order, performed in [facility] hospital lab) *cepheid single result test* Anterior Nasal Swab     Status: None   Collection Time: 09/03/21  2:22 PM   Specimen: Anterior Nasal Swab  Result Value Ref Range Status   SARS Coronavirus 2 by RT PCR NEGATIVE NEGATIVE Final    Comment: (NOTE) SARS-CoV-2 target nucleic acids are NOT DETECTED.  The SARS-CoV-2 RNA is generally detectable in upper and lower respiratory specimens during the acute phase of infection. The lowest concentration of SARS-CoV-2 viral copies this assay can detect is 250 copies / mL. A negative result does not preclude SARS-CoV-2 infection and should not be used as the sole basis for treatment or other patient management decisions.  A negative result may occur with improper specimen collection / handling, submission of specimen other than nasopharyngeal swab, presence of viral mutation(s) within the areas targeted by this assay, and inadequate number of viral copies (<250 copies / mL). A negative result must be combined with clinical observations, patient history, and epidemiological information.  Fact Sheet for Patients:   https://www.patel.info/  Fact Sheet for Healthcare Providers: https://hall.com/  This test is not yet approved or  cleared by the [place] FDA and has been authorized for detection and/or diagnosis of SARS-CoV-2 by FDA under an Emergency Use Authorization (EUA).  This EUA will remain in effect (meaning this test can be used) for the duration of the COVID-19 declaration under Section 564(b)(1) of the Act, 21  U.S.C. section 360bbb-3(b)(1), unless the authorization is terminated or revoked sooner.  Performed at [facility], [place] [address] [place], [place] [address]     Radiology Studies:  No results found.  Scheduled Meds:    allopurinol  150 mg Oral Daily   amiodarone  200  mg Oral Daily   buPROPion  150 mg Oral Daily   Chlorhexidine Gluconate Cloth  6 each Topical Daily   [START ON 09/10/2021] furosemide  20 mg Oral Daily   levothyroxine  75 mcg Oral Q0600   loratadine  10 mg Oral Daily   magnesium oxide  200 mg Oral Daily   metoprolol succinate  25 mg Oral QHS   mirtazapine  30 mg Oral QHS   pantoprazole  40 mg Oral Daily   polyethylene glycol  17 g Oral Daily   pravastatin  40 mg Oral QHS   sodium chloride flush  10-40 mL Intracatheter Q12H   sodium chloride flush  3 mL Intravenous Q12H   Tbo-filgastrim (GRANIX) SQ  480 mcg Subcutaneous q1800    Continuous Infusions:       LOS: 5 days     Vernell Leep, MD,  FACP, [facility], [facility], [facility] (Care Management Physician Certified) [place]  To contact the attending provider between 7A-7P or the covering provider during after hours 7P-7A, please log into the web site [email or web address] and access using universal [place] password for that web site. If you do not have the password, please call the hospital operator.  09/09/2021, 2:39 PM

## 2021-09-09 NOTE — Procedures (Signed)
Vascular and Interventional Radiology Procedure Note  Patient: [name] [name] DOB: March 30, 1945 Medical Record Number: [id number] Note Date/Time: 09/09/21 3:45 PM   Performing Physician: Michaelle Birks, MD Assistant(s): None  Diagnosis: Lymphoma  Procedure: PORT PLACEMENT  Anesthesia: Conscious Sedation Complications: None Estimated Blood Loss: Minimal  Findings:  Successful right-sided port placement, with the tip of the catheter in the proximal right atrium.  Plan: Catheter ready for use.  See detailed procedure note with images in PACS. The patient tolerated the procedure well without incident or complication and was returned to Floor Bed in stable condition.    Michaelle Birks, MD [facility] Interventional Radiology [facility] Radiology   Pager. [place]

## 2021-09-09 NOTE — Telephone Encounter (Signed)
We received FMLA paperwork, but he did not sign. Needs to come in and sign so that we can send it. Left message on voicemail

## 2021-09-09 NOTE — TOC Progression Note (Signed)
Transition of Care [facility]) - Progression Note    Patient Details  Name: [name] [name] MRN: [id number] Date of Birth: [birth year]  Transition of Care [facility]) CM/SW [place], LCSW Phone Number: 09/09/2021, 10:16 AM  Clinical Narrative:    Pt has is set up with HHPT through [facility]. Per prior note pt declined rolling Rew. Pt to return home with husband. No additional TOC needs . TOC to sign off.    Expected Discharge Plan: [place] Barriers to Discharge: Continued Medical Work up  Expected Discharge Plan and Services Expected Discharge Plan: [place]   Discharge Planning Services: CM Consult   Living arrangements for the past 2 months: Single Family Home                   DME Agency: [employer] Date DME Agency Contacted: 09/08/21 Time DME Agency Contacted: 1506 Representative spoke with at DME Agency: [place]: PT           Social Determinants of Health ([place]) Interventions    Readmission Risk Interventions    09/01/2021    8:52 AM  Readmission Risk Prevention Plan  Transportation Screening Complete  PCP or Specialist Appt within 3-5 Days Complete  HRI or [place] Complete  Social Work Consult for [place] Planning/Counseling Complete  Palliative Care Screening Not Applicable  Medication Review [employer]) Complete

## 2021-09-09 NOTE — Progress Notes (Signed)
Mobility Specialist - Progress Note   09/09/21 1141  Mobility  Activity Refused mobility   Pt refused mobility due to fatigue. Claimed to be walking in room with husband. And requested to be seen tomorrow afternoon, will check in then.   Roderick Pee Mobility Specialist

## 2021-09-10 ENCOUNTER — Other Ambulatory Visit (HOSPITAL_COMMUNITY): Payer: Medicare HMO

## 2021-09-10 ENCOUNTER — Inpatient Hospital Stay: Payer: Medicare HMO

## 2021-09-10 DIAGNOSIS — C8598 Non-Hodgkin lymphoma, unspecified, lymph nodes of multiple sites: Secondary | ICD-10-CM | POA: Diagnosis not present

## 2021-09-10 DIAGNOSIS — R55 Syncope and collapse: Secondary | ICD-10-CM | POA: Diagnosis not present

## 2021-09-10 LAB — CBC
HCT: 28.8 % — ABNORMAL LOW (ref 36.0–46.0)
Hemoglobin: 9 g/dL — ABNORMAL LOW (ref 12.0–15.0)
MCH: 30.1 pg (ref 26.0–34.0)
MCHC: 31.3 g/dL (ref 30.0–36.0)
MCV: 96.3 fL (ref 80.0–100.0)
Platelets: 101 10*3/uL — ABNORMAL LOW (ref 150–400)
RBC: 2.99 MIL/uL — ABNORMAL LOW (ref 3.87–5.11)
RDW: 16.8 % — ABNORMAL HIGH (ref 11.5–15.5)
WBC: 8.7 10*3/uL (ref 4.0–10.5)
nRBC: 0 % (ref 0.0–0.2)

## 2021-09-10 LAB — BASIC METABOLIC PANEL
Anion gap: 2 — ABNORMAL LOW (ref 5–15)
BUN: 33 mg/dL — ABNORMAL HIGH (ref 8–23)
CO2: 25 mmol/L (ref 22–32)
Calcium: 8.2 mg/dL — ABNORMAL LOW (ref 8.9–10.3)
Chloride: 113 mmol/L — ABNORMAL HIGH (ref 98–111)
Creatinine, Ser: 1.17 mg/dL — ABNORMAL HIGH (ref 0.44–1.00)
GFR, Estimated: 49 mL/min — ABNORMAL LOW (ref >60)
Glucose, Bld: 140 mg/dL — ABNORMAL HIGH (ref 70–99)
Potassium: 3.9 mmol/L (ref 3.5–5.1)
Sodium: 140 mmol/L (ref 135–145)

## 2021-09-10 MED ORDER — ALLOPURINOL 300 MG PO TABS: 150.0000 mg | ORAL_TABLET | Freq: Every day | ORAL | 0 refills | Status: DC

## 2021-09-10 MED ORDER — HEPARIN SOD (PORK) LOCK FLUSH 100 UNIT/ML IV SOLN
500.0000 [IU] | Freq: Once | INTRAVENOUS | Status: AC
  Administered 2021-09-10: 500 [IU] via INTRAVENOUS
  Filled 2021-09-10: qty 5

## 2021-09-10 NOTE — Discharge Summary (Signed)
Physician Discharge Summary  PIHU BASIL UXY:[id number] DOB: 09-14-1945 DOA: 09/03/2021  PCP: Harlan Stains, MD  Admit date: 09/03/2021 Discharge date: 09/10/2021  Admitted From: Home Disposition: Home with home health  Recommendations for Outpatient Follow-up:  Follow up with PCP in 1-2 weeks Follow-up tomorrow at [facility] for additional G-CSF.  Home Health: PT/OT Equipment/Devices: None  Discharge Condition: Stable CODE STATUS: DNR Diet recommendation: Regular diet  Discharge summary: Patient with history of persistent A-fib, stage III CKD, anxiety depression, hypertension hyperlipidemia, hypothyroidism who was recently admitted to the hospital with generalized weakness, poor appetite night sweats and weight loss and found to have diffuse large B-cell lymphoma.  She was discharged home with plans to restart on chemotherapy however had to come to the hospital due to worsening fatigue and weakness, a syncopal episode while in the ER.  Seen by oncology, she received first cycle of R-CHOP therapy in the hospital with a PICC line.  A Port-A-Cath was placed for further therapy and discharged home today.  Newly diagnosed large B-cell lymphoma with pancytopenia, mild hemoptysis: First cycle of R-CHOP on 8/25.  Received prednisone 60 mg daily for 4 days.  She is also on allopurinol that will be continued.  Renal functions are stabilized. Port-A-Cath placed 8/29.  PICC line to be removed before discharge. Patient to receive total 5 days of G-CSF, last dose tomorrow at cancer center. Has bicytopenia but blood count has stabilized.  Hemoglobin is stable.  Syncope: Probably vasovagal.  2D echocardiogram unchanged.  Carotids no hemodynamically significant obstruction.  Anxiety disorder: Currently on Remeron, bupropion and alprazolam.  Continue on discharge.  Persistent A-fib: Rate controlled on amiodarone and Toprol-XL.  Eliquis discontinued for now for anticipated procedures.  Chronic  diastolic heart failure with multifactorial lower leg edema: Compression stockings.  Can resume Lasix as needed.  Hypothyroidism: Stable on Synthroid.  Stable for discharge with outpatient follow-up plans.  Discharge Diagnoses:  Principal Problem:   Syncope Active Problems:   Generalized anxiety disorder   PAF (paroxysmal atrial fibrillation) (HCC)   Hyperlipidemia   Essential hypertension   Gastroesophageal reflux disease without esophagitis   (HFpEF) heart failure with preserved ejection fraction (HCC)   OSA (obstructive sleep apnea)   Hypothyroidism   Pancytopenia (HCC)   NHL (non-Hodgkin's lymphoma) ([place])   Prediabetes    Discharge Instructions  Discharge Instructions     Diet general   Complete by: As directed    Increase activity slowly   Complete by: As directed    No wound care   Complete by: As directed    TREATMENT CONDITIONS   Complete by: As directed    Patient should have CBC & CMP within 7 days prior to chemotherapy administration. NOTIFY MD IF: ANC < 1500, Hemoglobin < 8, PLT < 100,000,  Total Bili > 1.5, Creatinine > 1.5, ALT & AST > 80 or if patient has unstable vital signs: Temperature >= 100.64F, SBP > 180 or < 90, RR > 30 or HR > 100.      Allergies as of 09/10/2021       Reactions   Omnicef [cefdinir] Swelling   Tongue swelling   Rituxan [rituximab] Shortness Of Breath   Patient given Pepcid 84m IV with fluids patinet reported feeling better. Treatment was restarted patient was able to tolerate the rest of the treatment with no issues.    Bacitracin    Rash   Bacitracin-polymyxin B    Rash   Duloxetine Hcl    felt funny-  thinking messed up   Lexapro [escitalopram]    Makes her feel funny and sleepy   Mirtazapine    weight gain in high doses    Penicillins Swelling   Tongue swell   Peroxide [hydrogen Peroxide] Other (See Comments)   Redness.    Zoloft [sertraline Hcl]    Makes her feel funny   Neosporin [neomycin-bacitracin Zn-polymyx]  Rash   Blisters, itching.         Medication List     STOP taking these medications    apixaban 5 MG Tabs tablet Commonly known as: Eliquis   predniSONE 20 MG tablet Commonly known as: DELTASONE   Vilazodone HCl 20 MG Tabs       TAKE these medications    acetaminophen 500 MG tablet Commonly known as: TYLENOL Take 1,000 mg by mouth every 6 (six) hours as needed for moderate pain.   AeroChamber MV inhaler Use as instructed   albuterol 108 (90 Base) MCG/ACT inhaler Commonly known as: Ventolin HFA Inhale 1-2 puffs into the lungs every 4 (four) hours as needed for wheezing or shortness of breath.   allopurinol 300 MG tablet Commonly known as: ZYLOPRIM Take 0.5 tablets (150 mg total) by mouth daily.   ALPRAZolam 0.5 MG tablet Commonly known as: XANAX Take 1 tablet (0.5 mg total) by mouth 3 (three) times daily as needed for anxiety.   amiodarone 200 MG tablet Commonly known as: PACERONE Taking one tablet by mouth daily What changed:  how much to take how to take this when to take this additional instructions   benzonatate 200 MG capsule Commonly known as: TESSALON Take 1 capsule by mouth three times daily as needed for cough What changed: See the new instructions.   buPROPion 300 MG 24 hr tablet Commonly known as: WELLBUTRIN XL Take 150 mg by mouth daily.   Co Q-10 100 MG Caps Take 100 mg by mouth daily.   famotidine 20 MG tablet Commonly known as: PEPCID Take 20 mg by mouth daily as needed for heartburn or indigestion.   fexofenadine 180 MG tablet Commonly known as: ALLEGRA Take 180 mg by mouth daily.   furosemide 20 MG tablet Commonly known as: LASIX Take 1 tablet (20 mg total) by mouth daily as needed for edema or fluid.   levothyroxine 75 MCG tablet Commonly known as: SYNTHROID TAKE 1 TABLET DAILY BEFORE BREAKFAST (NEED APPOINTMENT FOR FURTHER REFILLS) What changed: See the new instructions.   Magnesium 250 MG Tabs Take 250 mg by mouth  daily.   metoprolol succinate 25 MG 24 hr tablet Commonly known as: TOPROL-XL Take 1 tablet (25 mg total) by mouth daily. What changed: when to take this   mirtazapine 30 MG tablet Commonly known as: REMERON Take 30 mg by mouth at bedtime.   potassium chloride SA 20 MEQ tablet Commonly known as: KLOR-CON M Take 1 tablet (20 mEq total) by mouth daily as needed (Take potassium only when you take the Furosemide (Lasix)).   pravastatin 40 MG tablet Commonly known as: PRAVACHOL Take 40 mg by mouth at bedtime.   sodium chloride 0.65 % Soln nasal spray Commonly known as: OCEAN Place 1 spray into both nostrils daily as needed for congestion.               Durable Medical Equipment  (From admission, onward)           Start     Ordered   09/08/21 1431  For home use only DME [facility] Rile  Once       Question:  Patient needs a [place] to treat with the following condition  Answer:  Physical deconditioning   09/08/21 1430            Allergies  Allergen Reactions   Omnicef [Cefdinir] Swelling    Tongue swelling   Rituxan [Rituximab] Shortness Of Breath    Patient given Pepcid $RemoveBefo'20mg'AtzeamjtkuF$  IV with fluids patinet reported feeling better. Treatment was restarted patient was able to tolerate the rest of the treatment with no issues.    Bacitracin     Rash   Bacitracin-Polymyxin B     Rash   Duloxetine Hcl     felt funny- thinking messed up   Lexapro [Escitalopram]     Makes her feel funny and sleepy   Mirtazapine     weight gain in high doses    Penicillins Swelling    Tongue swell   Peroxide [Hydrogen Peroxide] Other (See Comments)    Redness.    Zoloft [Sertraline Hcl]     Makes her feel funny   Neosporin [Neomycin-Bacitracin Zn-Polymyx] Rash    Blisters, itching.     Consultations: Oncology   Procedures/Studies: IR IMAGING GUIDED PORT INSERTION  Result Date: 09/09/2021 INDICATION: Lymphoma EXAM: IMPLANTED PORT A CATH PLACEMENT WITH ULTRASOUND AND FLUOROSCOPIC  GUIDANCE MEDICATIONS: None. ANESTHESIA/SEDATION: Moderate (conscious) sedation was employed during this procedure. A total of Versed 1 mg and Fentanyl 50 mcg was administered intravenously. Moderate Sedation Time: 20 minutes. The patient's level of consciousness and vital signs were monitored continuously by radiology nursing throughout the procedure under my direct supervision. FLUOROSCOPY TIME:  Fluoroscopic dose; 0 mGy COMPLICATIONS: None immediate. PROCEDURE: The procedure, risks, benefits, and alternatives were explained to the patient. Questions regarding the procedure were encouraged and answered. The the patient and/or patient's representative understands and consents to the procedure. The RIGHT neck and chest were prepped with chlorhexidine in a sterile fashion, and a sterile drape was applied covering the operative field. Maximum barrier sterile technique with sterile gowns and gloves were used for the procedure. A timeout was performed prior to the initiation of the procedure. Local anesthesia was provided with 1% lidocaine with epinephrine. After creating a small venotomy incision, a micropuncture kit was utilized to access the internal jugular vein under direct, real-time ultrasound guidance. Ultrasound image documentation was performed. The microwire was kinked to measure appropriate catheter length. A subcutaneous port pocket was then created along the upper chest wall utilizing a combination of sharp and blunt dissection. The pocket was irrigated with sterile saline. A single lumen ISP power injectable port was chosen for placement. The 8 Fr catheter was tunneled from the port pocket site to the venotomy incision. The port was placed in the pocket. The external catheter was trimmed to appropriate length. At the venotomy, an 8 Fr peel-away sheath was placed over a guidewire under fluoroscopic guidance. The catheter was then placed through the sheath and the sheath was removed. Final catheter  positioning was confirmed and documented with a fluoroscopic spot radiograph. The port was accessed with a Huber needle, aspirated and flushed with heparinized saline. The port pocket incision was closed with interrupted 3-0 Vicryl suture then Dermabond was applied, including at the venotomy incision. Dressings were placed. The patient tolerated the procedure well without immediate post procedural complication. IMPRESSION: Successful placement of a RIGHT internal jugular approach power injectable Port-A-Cath. The tip of the catheter is positioned within the superior cavoatrial junction. The catheter is ready for immediate use.  Michaelle Birks, MD [facility] Radiology Electronically Signed   By: Michaelle Birks M.D.   On: 09/09/2021 17:14   VAS US CAROTID  Result Date: 09/04/2021 Carotid Arterial Duplex Study Patient Name:  ZAYLEY ARRAS  Date of Exam:   09/04/2021 Medical Rec #: [id number]      Accession #:    [id number] Date of Birth: 1945-05-02       Patient Gender: F Patient Age:   25 years Exam Location:  [facility] Procedure:      VAS US CAROTID Referring Phys: DAVID ORTIZ --------------------------------------------------------------------------------  Indications:       Syncope. Risk Factors:      Hypertension, hyperlipidemia. Limitations        Today's exam was limited due to the patient's respiratory                    variation. Comparison Study:  No prior studies. Performing Technologist: Oliver Hum RVT  Examination Guidelines: A complete evaluation includes B-mode imaging, spectral Doppler, color Doppler, and power Doppler as needed of all accessible portions of each vessel. Bilateral testing is considered an integral part of a complete examination. Limited examinations for reoccurring indications may be performed as noted.  Right Carotid Findings: +----------+--------+--------+--------+-----------------------+--------+           PSV cm/sEDV  cm/sStenosisPlaque Description     Comments +----------+--------+--------+--------+-----------------------+--------+ CCA Prox  92      12                                     tortuous +----------+--------+--------+--------+-----------------------+--------+ CCA Distal73      14              smooth and heterogenous         +----------+--------+--------+--------+-----------------------+--------+ ICA Prox  74      16              smooth and heterogenous         +----------+--------+--------+--------+-----------------------+--------+ ICA Distal65      14                                     tortuous +----------+--------+--------+--------+-----------------------+--------+ ECA       89      4                                               +----------+--------+--------+--------+-----------------------+--------+ +----------+--------+-------+--------+-------------------+           PSV cm/sEDV cmsDescribeArm Pressure (mmHG) +----------+--------+-------+--------+-------------------+ [id number]                                        +----------+--------+-------+--------+-------------------+ +---------+--------+--+--------+-+---------+ VertebralPSV cm/s57EDV cm/s8Antegrade +---------+--------+--+--------+-+---------+  Left Carotid Findings: +----------+-------+-------+--------+---------------------------------+--------+           PSV    EDV    StenosisPlaque Description               Comments           cm/s   cm/s                                                     +----------+-------+-------+--------+---------------------------------+--------+  CCA Prox  95     14                                                       +----------+-------+-------+--------+---------------------------------+--------+ CCA Distal77     14             smooth and heterogenous                    +----------+-------+-------+--------+---------------------------------+--------+ ICA Prox  86     19             calcific, irregular and          tortuous                                 heterogenous                              +----------+-------+-------+--------+---------------------------------+--------+ ICA Distal78     15                                              tortuous +----------+-------+-------+--------+---------------------------------+--------+ ECA       151    14                                                       +----------+-------+-------+--------+---------------------------------+--------+ +----------+--------+--------+--------+-------------------+           PSV cm/sEDV cm/sDescribeArm Pressure (mmHG) +----------+--------+--------+--------+-------------------+ [id number]                                         +----------+--------+--------+--------+-------------------+ +---------+--------+--+--------+--+---------+ VertebralPSV cm/s54EDV cm/s12Antegrade +---------+--------+--+--------+--+---------+   Summary: Right Carotid: Velocities in the right ICA are consistent with a 1-39% stenosis. Left Carotid: Velocities in the left ICA are consistent with a 1-39% stenosis. Vertebrals: Bilateral vertebral arteries demonstrate antegrade flow. *See table(s) above for measurements and observations.  Electronically signed by Monica Martinez MD on 09/04/2021 at 1:02:46 PM.    Final    ECHOCARDIOGRAM COMPLETE  Result Date: 09/04/2021    ECHOCARDIOGRAM REPORT   Patient Name:   [name] [name] Date of Exam: 09/04/2021 Medical Rec #:  [id number]     Height:       67.0 in Accession #:    [id number]    Weight:       197.8 lb Date of Birth:  06-08-1945      BSA:          2.013 m Patient Age:    73 years      BP:           115/51 mmHg Patient Gender: F             HR:           83 bpm. Exam Location:  Inpatient Procedure: 2D Echo, Cardiac Doppler and Color Doppler  Indications:  Chemotherapy  History:        Patient has prior history of Echocardiogram examinations, most                 recent 08/09/2020. Risk Factors:Sleep Apnea, Diabetes and                 Hypertension.  Sonographer:    Jefferey Pica Referring Phys: [place]  1. Left ventricular ejection fraction, by estimation, is 65 to 70%. The left ventricle has normal function. The left ventricle has no regional wall motion abnormalities. Left ventricular diastolic parameters were normal.  2. Right ventricular systolic function is normal. The right ventricular size is normal. There is moderately elevated pulmonary artery systolic pressure.  3. The mitral valve is degenerative. Mild mitral valve regurgitation. Moderate mitral annular calcification.  4. The aortic valve is tricuspid. There is mild calcification of the aortic valve. There is mild thickening of the aortic valve. Aortic valve regurgitation is not visualized. Aortic valve sclerosis is present, with no evidence of aortic valve stenosis.  5. The inferior vena cava is normal in size with greater than 50% respiratory variability, suggesting right atrial pressure of 3 mmHg. Comparison(s): No significant change from prior study. FINDINGS  Left Ventricle: Left ventricular ejection fraction, by estimation, is 65 to 70%. The left ventricle has normal function. The left ventricle has no regional wall motion abnormalities. The left ventricular internal cavity size was normal in size. There is  no left ventricular hypertrophy. Left ventricular diastolic parameters were normal. Right Ventricle: The right ventricular size is normal. No increase in right ventricular wall thickness. Right ventricular systolic function is normal. There is moderately elevated pulmonary artery systolic pressure. The tricuspid regurgitant velocity is 3.62 m/s, and with an assumed right atrial pressure of 5 mmHg, the estimated right ventricular systolic pressure is  88.4 mmHg. Left Atrium: Left atrial size was normal in size. Right Atrium: Right atrial size was normal in size. Pericardium: There is no evidence of pericardial effusion. Mitral Valve: The mitral valve is degenerative in appearance. There is mild thickening of the mitral valve leaflet(s). There is mild calcification of the mitral valve leaflet(s). Moderate mitral annular calcification. Mild mitral valve regurgitation. Tricuspid Valve: The tricuspid valve is normal in structure. Tricuspid valve regurgitation is mild. Aortic Valve: The aortic valve is tricuspid. There is mild calcification of the aortic valve. There is mild thickening of the aortic valve. Aortic valve regurgitation is not visualized. Aortic valve sclerosis is present, with no evidence of aortic valve stenosis. Aortic valve peak gradient measures 12.1 mmHg. Pulmonic Valve: The pulmonic valve was normal in structure. Pulmonic valve regurgitation is trivial. Aorta: The aortic root and ascending aorta are structurally normal, with no evidence of dilitation. Venous: The inferior vena cava is normal in size with greater than 50% respiratory variability, suggesting right atrial pressure of 3 mmHg. IAS/Shunts: The atrial septum is grossly normal.  LEFT VENTRICLE PLAX 2D LVIDd:         4.20 cm   Diastology LVIDs:         2.30 cm   LV e' medial:    7.88 cm/s LV PW:         1.10 cm   LV E/e' medial:  16.6 LV IVS:        1.00 cm   LV e' lateral:   9.08 cm/s LVOT diam:     1.80 cm   LV E/e' lateral: 14.4 LV SV:  88 LV SV Index:   43 LVOT Area:     2.54 cm  RIGHT VENTRICLE RV S prime:     19.20 cm/s TAPSE (M-mode): 2.6 cm LEFT ATRIUM             Index        RIGHT ATRIUM           Index LA diam:        4.50 cm 2.24 cm/m   RA Area:     17.30 cm LA Vol (A2C):   57.4 ml 28.52 ml/m  RA Volume:   45.00 ml  22.36 ml/m LA Vol (A4C):   54.0 ml 26.83 ml/m LA Biplane Vol: 57.5 ml 28.57 ml/m  AORTIC VALVE                  PULMONIC VALVE AV Area (Vmax): 2.33 cm       PV Vmax:       1.12 m/s AV Vmax:        174.00 cm/s   PV Peak grad:  5.0 mmHg AV Peak Grad:   12.1 mmHg LVOT Vmax:      159.00 cm/s LVOT Vmean:     108.000 cm/s LVOT VTI:       0.344 m  AORTA Ao Root diam: 3.10 cm Ao Asc diam:  3.40 cm MITRAL VALVE                TRICUSPID VALVE MV Area (PHT): 3.65 cm     TR Peak grad:   52.4 mmHg MV Decel Time: 208 msec     TR Vmax:        362.00 cm/s MV E velocity: 131.00 cm/s MV A velocity: 38.60 cm/s   SHUNTS MV E/A ratio:  3.39         Systemic VTI:  0.34 m                             Systemic Diam: 1.80 cm Gwyndolyn Kaufman MD Electronically signed by Gwyndolyn Kaufman MD Signature Date/Time: 09/04/2021/11:09:03 AM    Final    [place] EKG SITE RITE  Result Date: 09/03/2021 If Site Rite image not attached, placement could not be confirmed due to current cardiac rhythm.  DG Chest Portable 1 View  Result Date: 09/03/2021 CLINICAL DATA:  Provided history: Hematemesis. Additional history provided: Weakness, recent diagnosis of lymphoma. EXAM: PORTABLE CHEST 1 VIEW COMPARISON:  Head CT 08/27/2021. Prior chest radiographs 04/20/2021 and earlier. FINDINGS: Heart size at the upper limits of normal. Minimal atelectasis within the left lung base. No appreciable airspace consolidation or pulmonary edema. No evidence of pleural effusion or pneumothorax. No acute bony abnormality identified. Dextrocurvature of the thoracic spine. IMPRESSION: Minimal atelectasis within the left lung base. Otherwise, no evidence of acute cardiopulmonary abnormality. Electronically Signed   By: Kellie Simmering D.O.   On: 09/03/2021 12:20   NM PET Image Initial (PI) Skull Base To Thigh  Result Date: 08/29/2021 CLINICAL DATA:  Initial treatment strategy for progressive pancytopenia. Severe fatigue. Anorexia with weight loss. Night sweats. EXAM: NUCLEAR MEDICINE PET SKULL BASE TO THIGH TECHNIQUE: 9.1 mCi F-18 FDG was injected intravenously. Full-ring PET imaging was performed from the skull base to thigh  after the radiotracer. CT data was obtained and used for attenuation correction and anatomic localization. Fasting blood glucose: 99 mg/dl COMPARISON:  02/17/2021 CT chest, abdomen and pelvis. FINDINGS: Mediastinal blood pool activity: SUV max 2.7  Liver activity: SUV max 4.3 NECK: Several small hypermetabolic bilateral neck lymph nodes. Representative left level 2 neck 0.5 cm node with max SUV 10.5 (series 4/image 34). Representative 0.6 cm right level 3 neck node with max SUV 13.8 (series 4/image 38). Incidental CT findings: None. CHEST: Hypermetabolic left axillary lymphadenopathy with dominant 1.0 cm left axillary node with max SUV 23.6 (series 4/image 59). No enlarged or hypermetabolic right axillary nodes. Several mildly enlarged hypermetabolic bilateral mediastinal lymph nodes. Representative 1.1 cm right paratracheal node with max SUV 14.2 (series 4/image 65). Representative 1.0 cm subcarinal node with max SUV 29.5 (series 4/image 75). Hypermetabolic left hilar adenopathy with max SUV 7.4. No hypermetabolic right hilar adenopathy. Generalized hypermetabolism throughout the bilateral lung fields with associated faint ground-glass opacity in both lungs with representative max SUV 5.1 in the left lower lobe. Incidental CT findings: Coronary atherosclerosis. Atherosclerotic nonaneurysmal thoracic aorta. No significant pulmonary nodules. ABDOMEN/PELVIS: Widespread hypermetabolic lymphadenopathy in the porta hepatis, portacaval, aortocaval, left para-aortic, mesenteric, left common iliac, left external iliac and left inguinal nodal chains. Representative 1.3 cm porta hepatis node with max SUV 13.0 (series 5/image 115). Representative 1.3 cm aortocaval node with max SUV 25.7 (series 4/image 141). Representative 1.5 cm left para-aortic node with max SUV 26.7 (series 4/image 138). Representative 1.0 cm right mesenteric node with max SUV 21.2 (series 4/image 145). Representative 1.3 cm left external iliac node with max  SUV 15.8 (series 4/image 172). Representative 1.0 cm left inguinal node with max SUV 15.6 (series 4/image 187). Mild-to-moderate splenomegaly. Craniocaudal splenic length 16.3 cm. Splenic FDG uptake is equivalent to the liver with max SUV 4.4. No focal splenic masses. No abnormal hypermetabolic activity within the liver, pancreas or adrenal glands. Incidental CT findings: Postsurgical changes from fundoplication in the proximal stomach. Atherosclerotic nonaneurysmal abdominal aorta. Hysterectomy. SKELETON: Multiple hypermetabolic skeletal lesions in the axial and visualized proximal appendicular skeleton. Representative right T11 vertebral/posterior element lesion with max SUV 15.4. Representative proximal left humerus metaphysis lesion with max SUV 5.0. Representative left mandibular body lesion with max SUV 6.7. Incidental CT findings: None. IMPRESSION: 1. Widespread intensely hypermetabolic lymphadenopathy in the neck, left axilla, mediastinum, retroperitoneum and left pelvis. Mild-to-moderate splenomegaly. Scattered hypermetabolic skeletal lesions. Generalized bilateral pulmonary hypermetabolism with associated faint ground-glass opacity. Findings are most compatible with lymphoproliferative disorder/lymphoma. Deauville score 5. 2. Chronic findings include: Aortic Atherosclerosis (ICD10-I70.0). Coronary atherosclerosis. Electronically Signed   By: Ilona Sorrel M.D.   On: 08/29/2021 10:09   IR BONE MARROW BIOPSY & ASPIRATION  Result Date: 08/13/2021 INDICATION: pancytopenia, sweats, splenomegaly, anorexia EXAM: Bone marrow aspiration and core biopsy using fluoroscopic guidance MEDICATIONS: None. ANESTHESIA/SEDATION: Moderate (conscious) sedation was employed during this procedure. A total of Versed 2 mg and Fentanyl 100 mcg was administered intravenously. Moderate Sedation Time: 10 minutes. The patient's level of consciousness and vital signs were monitored continuously by radiology nursing throughout the  procedure under my direct supervision. FLUOROSCOPY TIME:  Fluoroscopy Time: 1.8 minutes (18 mGy) COMPLICATIONS: None immediate. PROCEDURE: Informed written consent was obtained from the patient after a thorough discussion of the procedural risks, benefits and alternatives. All questions were addressed. Maximal Sterile Barrier Technique was utilized including caps, mask, sterile gowns, sterile gloves, sterile drape, hand hygiene and skin antiseptic. A timeout was performed prior to the initiation of the procedure. The patient was placed prone on the IR exam table. Limited fluoroscopy of the pelvis was performed for planning purposes. Skin entry site was marked, and the overlying skin was prepped and draped in the  standard sterile fashion. Local analgesia was obtained with 1% lidocaine. Using fluoroscopic guidance, an 11 gauge needle was advanced just deep to the cortex of the right posterior ilium. Subsequently, bone marrow aspiration and core biopsy were performed. Specimens were submitted to lab/pathology for handling. Hemostasis was achieved with manual pressure, and a clean dressing was placed. The patient tolerated the procedure well without immediate complication. IMPRESSION: Successful bone marrow aspiration and core biopsy of the right posterior ilium using fluoroscopic guidance. Electronically Signed   By: Albin Felling M.D.   On: 08/13/2021 14:00   (Echo, Carotid, EGD, Colonoscopy, ERCP)    Subjective: Patient was seen and examined.  No overnight events.  Denies any chest pain or shortness of breath.  Leg swelling is persistent.  She will be going home today after her husband comes back from the procedure.   Discharge Exam: Vitals:   09/09/21 2219 09/10/21 0411  BP: (!) 93/50 (!) 110/59  Pulse: (!) 54 (!) 54  Resp: 14 14  Temp: (!) 97.5 F (36.4 C) (!) 97.3 F (36.3 C)  SpO2: 99% 100%   Vitals:   09/09/21 1610 09/09/21 1615 09/09/21 2219 09/10/21 0411  BP: (!) 107/57 (!) 110/55 (!)  93/50 (!) 110/59  Pulse: (!) 55 (!) 55 (!) 54 (!) 54  Resp: 16 12 14 14   Temp:   (!) 97.5 F (36.4 C) (!) 97.3 F (36.3 C)  TempSrc:   Oral Oral  SpO2: 98% 98% 99% 100%  Weight:    91.5 kg  Height:        General: Pt is alert, awake, not in acute distress Sitting in chair. Cardiovascular: RRR, S1/S2 +, no rubs, no gallops Respiratory: CTA bilaterally, no wheezing, no rhonchi Abdominal: Soft, NT, ND, bowel sounds + Extremities: 2+ bilateral pedal edema with mild tenderness.    The results of significant diagnostics from this hospitalization (including imaging, microbiology, ancillary and laboratory) are listed below for reference.     Microbiology: Recent Results (from the past 240 hour(s))  SARS Coronavirus 2 by RT PCR (hospital order, performed in [facility] hospital lab) *cepheid single result test* Anterior Nasal Swab     Status: None   Collection Time: 09/03/21  2:22 PM   Specimen: Anterior Nasal Swab  Result Value Ref Range Status   SARS Coronavirus 2 by RT PCR NEGATIVE NEGATIVE Final    Comment: (NOTE) SARS-CoV-2 target nucleic acids are NOT DETECTED.  The SARS-CoV-2 RNA is generally detectable in upper and lower respiratory specimens during the acute phase of infection. The lowest concentration of SARS-CoV-2 viral copies this assay can detect is 250 copies / mL. A negative result does not preclude SARS-CoV-2 infection and should not be used as the sole basis for treatment or other patient management decisions.  A negative result may occur with improper specimen collection / handling, submission of specimen other than nasopharyngeal swab, presence of viral mutation(s) within the areas targeted by this assay, and inadequate number of viral copies (<250 copies / mL). A negative result must be combined with clinical observations, patient history, and epidemiological information.  Fact Sheet for Patients:   https://www.patel.info/  Fact Sheet for  Healthcare Providers: https://hall.com/  This test is not yet approved or  cleared by the [place] FDA and has been authorized for detection and/or diagnosis of SARS-CoV-2 by FDA under an Emergency Use Authorization (EUA).  This EUA will remain in effect (meaning this test can be used) for the duration of the COVID-19 declaration under  Section 564(b)(1) of the Act, 21 U.S.C. section 360bbb-3(b)(1), unless the authorization is terminated or revoked sooner.  Performed at [facility], [place] [address] [place], [place] [address]      Labs: BNP (last 3 results) No results for input(s): "BNP" in the last 8760 hours. Basic Metabolic Panel: Recent Labs  Lab 09/03/21 1224 09/04/21 0340 09/06/21 0957 09/07/21 0630 09/08/21 0615 09/09/21 0640 09/10/21 0438  NA  --    < > 142 142 140 143 140  K  --    < > 3.9 3.9 3.8 4.0 3.9  CL  --    < > 117* 117* 113* 115* 113*  CO2  --    < > 20* 21* 23 25 25   GLUCOSE  --    < > 196* 120* 114* 112* 140*  BUN  --    < > 25* 27* 33* 36* 33*  CREATININE  --    < > 0.96 1.03* 1.12* 1.21* 1.17*  CALCIUM  --    < > 7.9* 8.3* 8.2* 8.3* 8.2*  MG 1.8  --   --   --   --   --   --   PHOS  --   --  3.9 4.1  --   --   --    < > = values in this interval not displayed.   Liver Function Tests: Recent Labs  Lab 09/04/21 0340  AST 43*  ALT 34  ALKPHOS 73  BILITOT 0.8  PROT 4.3*  ALBUMIN 1.9*   No results for input(s): "LIPASE", "AMYLASE" in the last 168 hours. No results for input(s): "AMMONIA" in the last 168 hours. CBC: Recent Labs  Lab 09/05/21 0410 09/06/21 0957 09/06/21 2230 09/07/21 0630 09/08/21 0615 09/09/21 0640 09/10/21 0438  WBC 1.5* 1.4*  --  4.5 7.8 8.5 8.7  NEUTROABS 1.0* 1.0*  --  3.9 6.9 8.0*  --   HGB 7.1* 6.8* 9.2* 8.5* 9.0* 8.8* 9.0*  HCT 23.2* 22.4* 29.1* 26.9* 28.3* 28.1* 28.8*  MCV 95.9 97.0  --  94.4 95.6 95.6 96.3  PLT 61* 64*  --  78* 94* 102* 101*   Cardiac  Enzymes: No results for input(s): "CKTOTAL", "CKMB", "CKMBINDEX", "TROPONINI" in the last 168 hours. BNP: Invalid input(s): "POCBNP" CBG: Recent Labs  Lab 09/03/21 1158 09/04/21 0745 09/04/21 1158  GLUCAP 102* 87 119*   D-Dimer No results for input(s): "DDIMER" in the last 72 hours. Hgb A1c No results for input(s): "HGBA1C" in the last 72 hours. Lipid Profile No results for input(s): "CHOL", "HDL", "LDLCALC", "TRIG", "CHOLHDL", "LDLDIRECT" in the last 72 hours. Thyroid function studies Recent Labs    09/07/21 1619  T3FREE 0.6*   Anemia work up No results for input(s): "VITAMINB12", "FOLATE", "FERRITIN", "TIBC", "IRON", "RETICCTPCT" in the last 72 hours. Urinalysis    Component Value Date/Time   COLORURINE STRAW (A) 09/03/2021 1225   APPEARANCEUR CLEAR 09/03/2021 1225   LABSPEC 1.008 09/03/2021 1225   PHURINE 5.0 09/03/2021 1225   GLUCOSEU NEGATIVE 09/03/2021 1225   HGBUR NEGATIVE 09/03/2021 [place] 09/03/2021 [place] 09/03/2021 1225   PROTEINUR NEGATIVE 09/03/2021 1225   NITRITE NEGATIVE 09/03/2021 1225   LEUKOCYTESUR NEGATIVE 09/03/2021 1225   Sepsis Labs Recent Labs  Lab 09/07/21 0630 09/08/21 0615 09/09/21 0640 09/10/21 0438  WBC 4.5 7.8 8.5 8.7   Microbiology Recent Results (from the past 240 hour(s))  SARS Coronavirus 2 by RT PCR (hospital order, performed in  [facility] hospital lab) *cepheid single result test* Anterior Nasal Swab     Status: None   Collection Time: 09/03/21  2:22 PM   Specimen: Anterior Nasal Swab  Result Value Ref Range Status   SARS Coronavirus 2 by RT PCR NEGATIVE NEGATIVE Final    Comment: (NOTE) SARS-CoV-2 target nucleic acids are NOT DETECTED.  The SARS-CoV-2 RNA is generally detectable in upper and lower respiratory specimens during the acute phase of infection. The lowest concentration of SARS-CoV-2 viral copies this assay can detect is 250 copies / mL. A negative result does not  preclude SARS-CoV-2 infection and should not be used as the sole basis for treatment or other patient management decisions.  A negative result may occur with improper specimen collection / handling, submission of specimen other than nasopharyngeal swab, presence of viral mutation(s) within the areas targeted by this assay, and inadequate number of viral copies (<250 copies / mL). A negative result must be combined with clinical observations, patient history, and epidemiological information.  Fact Sheet for Patients:   https://www.patel.info/  Fact Sheet for Healthcare Providers: https://hall.com/  This test is not yet approved or  cleared by the [place] FDA and has been authorized for detection and/or diagnosis of SARS-CoV-2 by FDA under an Emergency Use Authorization (EUA).  This EUA will remain in effect (meaning this test can be used) for the duration of the COVID-19 declaration under Section 564(b)(1) of the Act, 21 U.S.C. section 360bbb-3(b)(1), unless the authorization is terminated or revoked sooner.  Performed at [facility], [place] [address] [place], [place] [address]      Time coordinating discharge: 35 minutes  SIGNED:   Barb Merino, MD  [facility] 09/10/2021, 9:37 AM

## 2021-09-11 ENCOUNTER — Inpatient Hospital Stay: Payer: Medicare HMO | Admitting: Nurse Practitioner

## 2021-09-11 ENCOUNTER — Encounter: Payer: Self-pay | Admitting: Nurse Practitioner

## 2021-09-11 ENCOUNTER — Encounter (HOSPITAL_COMMUNITY): Payer: Self-pay | Admitting: Oncology

## 2021-09-11 ENCOUNTER — Inpatient Hospital Stay: Payer: Medicare HMO

## 2021-09-11 VITALS — BP 124/58 | HR 58 | Temp 98.2°F | Resp 18 | Ht 67.0 in | Wt 200.0 lb

## 2021-09-11 DIAGNOSIS — C8330 Diffuse large B-cell lymphoma, unspecified site: Secondary | ICD-10-CM | POA: Diagnosis not present

## 2021-09-11 DIAGNOSIS — R630 Anorexia: Secondary | ICD-10-CM | POA: Diagnosis not present

## 2021-09-11 DIAGNOSIS — R2681 Unsteadiness on feet: Secondary | ICD-10-CM | POA: Diagnosis not present

## 2021-09-11 DIAGNOSIS — C9000 Multiple myeloma not having achieved remission: Secondary | ICD-10-CM | POA: Diagnosis not present

## 2021-09-11 DIAGNOSIS — D539 Nutritional anemia, unspecified: Secondary | ICD-10-CM | POA: Diagnosis not present

## 2021-09-11 DIAGNOSIS — I4891 Unspecified atrial fibrillation: Secondary | ICD-10-CM | POA: Diagnosis not present

## 2021-09-11 DIAGNOSIS — C8598 Non-Hodgkin lymphoma, unspecified, lymph nodes of multiple sites: Secondary | ICD-10-CM

## 2021-09-11 DIAGNOSIS — D696 Thrombocytopenia, unspecified: Secondary | ICD-10-CM | POA: Diagnosis not present

## 2021-09-11 DIAGNOSIS — R61 Generalized hyperhidrosis: Secondary | ICD-10-CM | POA: Diagnosis not present

## 2021-09-11 DIAGNOSIS — D649 Anemia, unspecified: Secondary | ICD-10-CM

## 2021-09-11 DIAGNOSIS — N189 Chronic kidney disease, unspecified: Secondary | ICD-10-CM | POA: Diagnosis not present

## 2021-09-11 DIAGNOSIS — Z79899 Other long term (current) drug therapy: Secondary | ICD-10-CM | POA: Diagnosis not present

## 2021-09-11 DIAGNOSIS — G4730 Sleep apnea, unspecified: Secondary | ICD-10-CM | POA: Diagnosis not present

## 2021-09-11 DIAGNOSIS — D689 Coagulation defect, unspecified: Secondary | ICD-10-CM | POA: Diagnosis not present

## 2021-09-11 DIAGNOSIS — I129 Hypertensive chronic kidney disease with stage 1 through stage 4 chronic kidney disease, or unspecified chronic kidney disease: Secondary | ICD-10-CM | POA: Diagnosis not present

## 2021-09-11 DIAGNOSIS — R0609 Other forms of dyspnea: Secondary | ICD-10-CM | POA: Diagnosis not present

## 2021-09-11 DIAGNOSIS — R634 Abnormal weight loss: Secondary | ICD-10-CM | POA: Diagnosis not present

## 2021-09-11 DIAGNOSIS — R21 Rash and other nonspecific skin eruption: Secondary | ICD-10-CM | POA: Diagnosis not present

## 2021-09-11 DIAGNOSIS — I2510 Atherosclerotic heart disease of native coronary artery without angina pectoris: Secondary | ICD-10-CM | POA: Diagnosis not present

## 2021-09-11 LAB — CMP (CANCER CENTER ONLY)
ALT: 29 U/L (ref 0–44)
AST: 12 U/L — ABNORMAL LOW (ref 15–41)
Albumin: 2.7 g/dL — ABNORMAL LOW (ref 3.5–5.0)
Alkaline Phosphatase: 73 U/L (ref 38–126)
Anion gap: 5 (ref 5–15)
BUN: 28 mg/dL — ABNORMAL HIGH (ref 8–23)
CO2: 28 mmol/L (ref 22–32)
Calcium: 8.2 mg/dL — ABNORMAL LOW (ref 8.9–10.3)
Chloride: 108 mmol/L (ref 98–111)
Creatinine: 1.14 mg/dL — ABNORMAL HIGH (ref 0.44–1.00)
GFR, Estimated: 50 mL/min — ABNORMAL LOW (ref >60)
Glucose, Bld: 86 mg/dL (ref 70–99)
Potassium: 4.5 mmol/L (ref 3.5–5.1)
Sodium: 141 mmol/L (ref 135–145)
Total Bilirubin: 0.8 mg/dL (ref 0.3–1.2)
Total Protein: 4.5 g/dL — ABNORMAL LOW (ref 6.5–8.1)

## 2021-09-11 LAB — HEPATITIS B CORE ANTIBODY, TOTAL: Hep B Core Total Ab: NONREACTIVE

## 2021-09-11 LAB — CBC WITH DIFFERENTIAL (CANCER CENTER ONLY)
Abs Immature Granulocytes: 0 10*3/uL (ref 0.00–0.07)
Basophils Absolute: 0 10*3/uL (ref 0.0–0.1)
Basophils Relative: 0 %
Eosinophils Absolute: 0 10*3/uL (ref 0.0–0.5)
Eosinophils Relative: 0 %
HCT: 30.5 % — ABNORMAL LOW (ref 36.0–46.0)
Hemoglobin: 9.8 g/dL — ABNORMAL LOW (ref 12.0–15.0)
Lymphocytes Relative: 15 %
Lymphs Abs: 0.4 10*3/uL — ABNORMAL LOW (ref 0.7–4.0)
MCH: 29.9 pg (ref 26.0–34.0)
MCHC: 32.1 g/dL (ref 30.0–36.0)
MCV: 93 fL (ref 80.0–100.0)
Monocytes Absolute: 0 10*3/uL — ABNORMAL LOW (ref 0.1–1.0)
Monocytes Relative: 1 %
Neutro Abs: 2.4 10*3/uL (ref 1.7–7.7)
Neutrophils Relative %: 84 %
Platelet Count: 100 10*3/uL — ABNORMAL LOW (ref 150–400)
RBC: 3.28 MIL/uL — ABNORMAL LOW (ref 3.87–5.11)
RDW: 16.5 % — ABNORMAL HIGH (ref 11.5–15.5)
Smear Review: DECREASED
WBC Count: 2.8 10*3/uL — ABNORMAL LOW (ref 4.0–10.5)
nRBC: 0 % (ref 0.0–0.2)

## 2021-09-11 LAB — LACTATE DEHYDROGENASE: LDH: 382 U/L — ABNORMAL HIGH (ref 98–192)

## 2021-09-11 LAB — HEPATITIS B SURFACE ANTIGEN: Hepatitis B Surface Ag: NONREACTIVE

## 2021-09-11 MED ORDER — FILGRASTIM-SNDZ 480 MCG/0.8ML IJ SOSY
480.0000 ug | PREFILLED_SYRINGE | Freq: Once | INTRAMUSCULAR | Status: AC
  Administered 2021-09-11: 480 ug via SUBCUTANEOUS
  Filled 2021-09-11: qty 0.8

## 2021-09-11 NOTE — Progress Notes (Signed)
[facility] OFFICE PROGRESS NOTE   Diagnosis: Non-Hodgkin's lymphoma  INTERVAL HISTORY:   [name] [name] returns as scheduled.  She completed cycle 1 CHOP/Rituxan 09/05/2021.  She began Granix daily for 7 days beginning 09/06/2021.  She feels better overall.  Energy level has improved.  Slight improvement in appetite.  She continues to have night sweats.  No nausea or vomiting.  No mouth sores.  No diarrhea.  No change in baseline constipation.  She reports developing chest tightness during the Rituxan infusion.  Infusion was briefly interrupted.  She received Pepcid and symptoms resolved.  The infusion was completed.  No hives or shaking chills.  Objective:  Vital signs in last 24 hours:  Blood pressure (!) 124/58, pulse (!) 58, temperature 98.2 F (36.8 C), temperature source Oral, resp. rate 18, height 5' 7" (1.702 m), weight 200 lb (90.7 kg), SpO2 100 %.    HEENT: No thrush or ulcers. Resp: Lungs clear bilaterally. Cardio: Regular rate and rhythm. GI: Abdomen soft with mild generalized tenderness.  No hepatosplenomegaly. Vascular: Pitting edema lower leg bilaterally. Neuro: Alert and oriented. Skin: Left groin surgical incision has healed. Right chest Port-A-Cath site with surrounding ecchymosis.  Lab Results:  Lab Results  Component Value Date   WBC 2.8 (L) 09/11/2021   HGB 9.8 (L) 09/11/2021   HCT 30.5 (L) 09/11/2021   MCV 93.0 09/11/2021   PLT 100 (L) 09/11/2021   NEUTROABS PENDING 09/11/2021    Imaging:  IR IMAGING GUIDED PORT INSERTION  Result Date: 09/09/2021 INDICATION: Lymphoma EXAM: IMPLANTED PORT A CATH PLACEMENT WITH ULTRASOUND AND FLUOROSCOPIC GUIDANCE MEDICATIONS: None. ANESTHESIA/SEDATION: Moderate (conscious) sedation was employed during this procedure. A total of Versed 1 mg and Fentanyl 50 mcg was administered intravenously. Moderate Sedation Time: 20 minutes. The patient's level of consciousness and vital signs were monitored continuously  by radiology nursing throughout the procedure under my direct supervision. FLUOROSCOPY TIME:  Fluoroscopic dose; 0 mGy COMPLICATIONS: None immediate. PROCEDURE: The procedure, risks, benefits, and alternatives were explained to the patient. Questions regarding the procedure were encouraged and answered. The the patient and/or patient's representative understands and consents to the procedure. The RIGHT neck and chest were prepped with chlorhexidine in a sterile fashion, and a sterile drape was applied covering the operative field. Maximum barrier sterile technique with sterile gowns and gloves were used for the procedure. A timeout was performed prior to the initiation of the procedure. Local anesthesia was provided with 1% lidocaine with epinephrine. After creating a small venotomy incision, a micropuncture kit was utilized to access the internal jugular vein under direct, real-time ultrasound guidance. Ultrasound image documentation was performed. The microwire was kinked to measure appropriate catheter length. A subcutaneous port pocket was then created along the upper chest wall utilizing a combination of sharp and blunt dissection. The pocket was irrigated with sterile saline. A single lumen ISP power injectable port was chosen for placement. The 8 Fr catheter was tunneled from the port pocket site to the venotomy incision. The port was placed in the pocket. The external catheter was trimmed to appropriate length. At the venotomy, an 8 Fr peel-away sheath was placed over a guidewire under fluoroscopic guidance. The catheter was then placed through the sheath and the sheath was removed. Final catheter positioning was confirmed and documented with a fluoroscopic spot radiograph. The port was accessed with a Huber needle, aspirated and flushed with heparinized saline. The port pocket incision was closed with interrupted 3-0 Vicryl suture then Dermabond was applied, including  at the venotomy incision. Dressings  were placed. The patient tolerated the procedure well without immediate post procedural complication. IMPRESSION: Successful placement of a RIGHT internal jugular approach power injectable Port-A-Cath. The tip of the catheter is positioned within the superior cavoatrial junction. The catheter is ready for immediate use. Michaelle Birks, MD [facility] Electronically Signed   By: Michaelle Birks M.D.   On: 09/09/2021 17:14    Medications: I have reviewed the patient's current medications.  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  Negative myeloma FISH panel, 46XX; neotype myeloid disorders profile NGS sequencing-no pathogenic mutations detected in any of the genes on the NGS panel -PNH screen - 08/26/2020 -CTs 02/17/2021-new splenomegaly; diffuse bilateral bronchial wall thickening; clustered groundglass and fine nodular opacity in the dependent right lower lobe.  CAD. -Bone marrow biopsy 08/13/2021-hypercellular bone marrow for age with trilineage hematopoiesis; several atypical lymphoid aggregates present; no increase in blastic cells; flow cytometry without significant T or B-cell abnormalities; normal cytogenetics; "the limited morphologic and immunohistochemical features are atypical and a lymphoproliferative process is not excluded" -PET scan 1/66/0630-ZSWFUXNATF hypermetabolic lymphadenopathy in the neck, left axilla, mediastinum, retroperitoneum, and left pelvis.  Hypermetabolic skeletal lesions, generalized bilateral pulmonary hypermetabolism -08/30/2021-1 unit RBCs -Excisional lymph node biopsy left groin 08/30/2021-Large B-cell  lymphoma, CD20 positive, CD30 positive -Cycle 1 R-CHOP 09/05/2021, Granix 09/06/2021 for 7 days Fatigue Dyspnea on exertion Unsteady gait/balance disorder, followed by Dr. Tomi Likens CKD Atrial fibrillation Hypertension Sleep apnea Anorexia/weight loss 06/13/2021 skin rash, question vasculitis-course of prednisone initiated; 06/20/2021 marked improvement, rash recurred when she was tapered off of prednisone, prednisone resumed by dermatology Coagulopathy-most likely related to malnutrition and apixaban  Disposition: [name] [name] appears improved.  She is day 7 status post cycle 1 CHOP/Rituxan.  LDH has improved significantly.  CBC reviewed.  She will receive Granix today.  Plan for follow-up CBC early next week.  She will continue to work on nutrition and activity.  She is scheduled for lab, follow-up, cycle 2 CHOP/Rituxan 09/29/2021.  She will contact the office in the interim with any problems.  Patient seen with Dr. Benay Spice.    Ned Card ANP/GNP-BC   09/11/2021  9:39 AM [name] [name] was interviewed and examined.  Her clinical status has improved significantly following cycle 1 R-CHOP.  The CBC is adequate.  She will complete treatment with G-CSF today and again tomorrow.  She will return for a nadir CBC next week.  Cycle 2 R-CHOP is scheduled for 09/29/2021.  I was present for greater than 50% of today's visit.  I performed medical decision making.  [name], MD

## 2021-09-11 NOTE — Patient Instructions (Signed)
Filgrastim Injection What is this medication? FILGRASTIM (fil GRA stim) lowers the risk of infection in people who are receiving chemotherapy. It works by helping your body make more white blood cells, which protects your body from infection. It may also be used to help people who have been exposed to high doses of radiation. It can be used to help prepare your body before a stem cell transplant. It works by helping your bone marrow make and release stem cells into the blood. This medicine may be used for other purposes; ask your health care provider or pharmacist if you have questions. COMMON BRAND NAME(S): Neupogen, Nivestym, Releuko, Zarxio What should I tell my care team before I take this medication? They need to know if you have any of these conditions: History of blood diseases, such as sickle cell anemia Kidney disease Recent or ongoing radiation An unusual or allergic reaction to filgrastim, pegfilgrastim, latex, rubber, other medications, foods, dyes, or preservatives Pregnant or trying to get pregnant Breast-feeding How should I use this medication? This medication is injected under the skin or into a vein. It is usually given by your care team in a hospital or clinic setting. It may be given at home. If you get this medication at home, you will be taught how to prepare and give it. Use exactly as directed. Take it as directed on the prescription label at the same time every day. Keep taking it unless your care team tells you to stop. It is important that you put your used needles and syringes in a special sharps container. Do not put them in a trash can. If you do not have a sharps container, call your pharmacist or care team to get one. This medication comes with INSTRUCTIONS FOR USE. Ask your pharmacist for directions on how to use this medication. Read the information carefully. Talk to your pharmacist or care team if you have questions. Talk to your care team about the use of this  medication in children. While it may be prescribed for children for selected conditions, precautions do apply. Overdosage: If you think you have taken too much of this medicine contact a poison control center or emergency room at once. NOTE: This medicine is only for you. Do not share this medicine with others. What if I miss a dose? It is important not to miss any doses. Talk to your care team about what to do if you miss a dose. What may interact with this medication? Medications that may cause a release of neutrophils, such as lithium This list may not describe all possible interactions. Give your health care provider a list of all the medicines, herbs, non-prescription drugs, or dietary supplements you use. Also tell them if you smoke, drink alcohol, or use illegal drugs. Some items may interact with your medicine. What should I watch for while using this medication? Your condition will be monitored carefully while you are receiving this medication. You may need bloodwork while taking this medication. Talk to your care team about your risk of cancer. You may be more at risk for certain types of cancer if you take this medication. What side effects may I notice from receiving this medication? Side effects that you should report to your care team as soon as possible: Allergic reactions--skin rash, itching, hives, swelling of the face, lips, tongue, or throat Capillary leak syndrome--stomach or muscle pain, unusual weakness or fatigue, feeling faint or lightheaded, decrease in the amount of urine, swelling of the ankles, hands, or   feet, trouble breathing High white blood cell level--fever, fatigue, trouble breathing, night sweats, change in vision, weight loss Inflammation of the aorta--fever, fatigue, back, chest, or stomach pain, severe headache Kidney injury (glomerulonephritis)--decrease in the amount of urine, red or dark brown urine, foamy or bubbly urine, swelling of the ankles, hands, or  feet Shortness of breath or trouble breathing Spleen injury--pain in upper left stomach or shoulder Unusual bruising or bleeding Side effects that usually do not require medical attention (report to your care team if they continue or are bothersome): Back pain Bone pain Fatigue Fever Headache Nausea This list may not describe all possible side effects. Call your doctor for medical advice about side effects. You may report side effects to FDA at 1-800-FDA-1088. Where should I keep my medication? Keep out of the reach of children and pets. Keep this medication in the original packaging until you are ready to take it. Protect from light. See product for storage information. Each product may have different instructions. Get rid of any unused medication after the expiration date. To get rid of medications that are no longer needed or have expired: Take the medication to a medications take-back program. Check with your pharmacy or law enforcement to find a location. If you cannot return the medication, ask your pharmacist or care team how to get rid of this medication safely. NOTE: This sheet is a summary. It may not cover all possible information. If you have questions about this medicine, talk to your doctor, pharmacist, or health care provider.  2023 Elsevier/Gold Standard (2021-04-08 00:00:00)  

## 2021-09-12 ENCOUNTER — Other Ambulatory Visit: Payer: Self-pay

## 2021-09-12 ENCOUNTER — Telehealth: Payer: Self-pay

## 2021-09-12 ENCOUNTER — Inpatient Hospital Stay: Payer: Medicare HMO | Attending: Oncology

## 2021-09-12 ENCOUNTER — Encounter: Payer: Self-pay | Admitting: Oncology

## 2021-09-12 VITALS — BP 115/54 | HR 69 | Temp 98.1°F | Resp 18

## 2021-09-12 DIAGNOSIS — R2681 Unsteadiness on feet: Secondary | ICD-10-CM | POA: Diagnosis not present

## 2021-09-12 DIAGNOSIS — R0609 Other forms of dyspnea: Secondary | ICD-10-CM | POA: Insufficient documentation

## 2021-09-12 DIAGNOSIS — Z79899 Other long term (current) drug therapy: Secondary | ICD-10-CM | POA: Diagnosis not present

## 2021-09-12 DIAGNOSIS — Z5112 Encounter for antineoplastic immunotherapy: Secondary | ICD-10-CM | POA: Diagnosis not present

## 2021-09-12 DIAGNOSIS — Z5111 Encounter for antineoplastic chemotherapy: Secondary | ICD-10-CM | POA: Insufficient documentation

## 2021-09-12 DIAGNOSIS — R161 Splenomegaly, not elsewhere classified: Secondary | ICD-10-CM | POA: Diagnosis not present

## 2021-09-12 DIAGNOSIS — I129 Hypertensive chronic kidney disease with stage 1 through stage 4 chronic kidney disease, or unspecified chronic kidney disease: Secondary | ICD-10-CM | POA: Diagnosis not present

## 2021-09-12 DIAGNOSIS — D696 Thrombocytopenia, unspecified: Secondary | ICD-10-CM | POA: Diagnosis not present

## 2021-09-12 DIAGNOSIS — D539 Nutritional anemia, unspecified: Secondary | ICD-10-CM | POA: Diagnosis not present

## 2021-09-12 DIAGNOSIS — I4891 Unspecified atrial fibrillation: Secondary | ICD-10-CM | POA: Insufficient documentation

## 2021-09-12 DIAGNOSIS — R918 Other nonspecific abnormal finding of lung field: Secondary | ICD-10-CM | POA: Insufficient documentation

## 2021-09-12 DIAGNOSIS — N189 Chronic kidney disease, unspecified: Secondary | ICD-10-CM | POA: Insufficient documentation

## 2021-09-12 DIAGNOSIS — G4730 Sleep apnea, unspecified: Secondary | ICD-10-CM | POA: Diagnosis not present

## 2021-09-12 DIAGNOSIS — R634 Abnormal weight loss: Secondary | ICD-10-CM | POA: Insufficient documentation

## 2021-09-12 DIAGNOSIS — Z5189 Encounter for other specified aftercare: Secondary | ICD-10-CM | POA: Diagnosis not present

## 2021-09-12 DIAGNOSIS — C8598 Non-Hodgkin lymphoma, unspecified, lymph nodes of multiple sites: Secondary | ICD-10-CM

## 2021-09-12 DIAGNOSIS — I2510 Atherosclerotic heart disease of native coronary artery without angina pectoris: Secondary | ICD-10-CM | POA: Insufficient documentation

## 2021-09-12 DIAGNOSIS — C9000 Multiple myeloma not having achieved remission: Secondary | ICD-10-CM | POA: Insufficient documentation

## 2021-09-12 DIAGNOSIS — R21 Rash and other nonspecific skin eruption: Secondary | ICD-10-CM | POA: Diagnosis not present

## 2021-09-12 DIAGNOSIS — R590 Localized enlarged lymph nodes: Secondary | ICD-10-CM | POA: Insufficient documentation

## 2021-09-12 DIAGNOSIS — C8330 Diffuse large B-cell lymphoma, unspecified site: Secondary | ICD-10-CM | POA: Insufficient documentation

## 2021-09-12 DIAGNOSIS — R630 Anorexia: Secondary | ICD-10-CM | POA: Diagnosis not present

## 2021-09-12 MED ORDER — FILGRASTIM-SNDZ 480 MCG/0.8ML IJ SOSY
480.0000 ug | PREFILLED_SYRINGE | Freq: Once | INTRAMUSCULAR | Status: AC
  Administered 2021-09-12: 480 ug via SUBCUTANEOUS
  Filled 2021-09-12: qty 0.8

## 2021-09-12 NOTE — Telephone Encounter (Signed)
Patient's daughter called in about a PT referral. I fax over [phone],  the referral to [facility].

## 2021-09-12 NOTE — Telephone Encounter (Signed)
Completed per Merceda Elks, RN

## 2021-09-12 NOTE — Patient Instructions (Signed)
Filgrastim Injection What is this medication? FILGRASTIM (fil GRA stim) lowers the risk of infection in people who are receiving chemotherapy. It works by helping your body make more white blood cells, which protects your body from infection. It may also be used to help people who have been exposed to high doses of radiation. It can be used to help prepare your body before a stem cell transplant. It works by helping your bone marrow make and release stem cells into the blood. This medicine may be used for other purposes; ask your health care provider or pharmacist if you have questions. COMMON BRAND NAME(S): Neupogen, Nivestym, Releuko, Zarxio What should I tell my care team before I take this medication? They need to know if you have any of these conditions: History of blood diseases, such as sickle cell anemia Kidney disease Recent or ongoing radiation An unusual or allergic reaction to filgrastim, pegfilgrastim, latex, rubber, other medications, foods, dyes, or preservatives Pregnant or trying to get pregnant Breast-feeding How should I use this medication? This medication is injected under the skin or into a vein. It is usually given by your care team in a hospital or clinic setting. It may be given at home. If you get this medication at home, you will be taught how to prepare and give it. Use exactly as directed. Take it as directed on the prescription label at the same time every day. Keep taking it unless your care team tells you to stop. It is important that you put your used needles and syringes in a special sharps container. Do not put them in a trash can. If you do not have a sharps container, call your pharmacist or care team to get one. This medication comes with INSTRUCTIONS FOR USE. Ask your pharmacist for directions on how to use this medication. Read the information carefully. Talk to your pharmacist or care team if you have questions. Talk to your care team about the use of this  medication in children. While it may be prescribed for children for selected conditions, precautions do apply. Overdosage: If you think you have taken too much of this medicine contact a poison control center or emergency room at once. NOTE: This medicine is only for you. Do not share this medicine with others. What if I miss a dose? It is important not to miss any doses. Talk to your care team about what to do if you miss a dose. What may interact with this medication? Medications that may cause a release of neutrophils, such as lithium This list may not describe all possible interactions. Give your health care provider a list of all the medicines, herbs, non-prescription drugs, or dietary supplements you use. Also tell them if you smoke, drink alcohol, or use illegal drugs. Some items may interact with your medicine. What should I watch for while using this medication? Your condition will be monitored carefully while you are receiving this medication. You may need bloodwork while taking this medication. Talk to your care team about your risk of cancer. You may be more at risk for certain types of cancer if you take this medication. What side effects may I notice from receiving this medication? Side effects that you should report to your care team as soon as possible: Allergic reactions--skin rash, itching, hives, swelling of the face, lips, tongue, or throat Capillary leak syndrome--stomach or muscle pain, unusual weakness or fatigue, feeling faint or lightheaded, decrease in the amount of urine, swelling of the ankles, hands, or   feet, trouble breathing High white blood cell level--fever, fatigue, trouble breathing, night sweats, change in vision, weight loss Inflammation of the aorta--fever, fatigue, back, chest, or stomach pain, severe headache Kidney injury (glomerulonephritis)--decrease in the amount of urine, red or dark brown urine, foamy or bubbly urine, swelling of the ankles, hands, or  feet Shortness of breath or trouble breathing Spleen injury--pain in upper left stomach or shoulder Unusual bruising or bleeding Side effects that usually do not require medical attention (report to your care team if they continue or are bothersome): Back pain Bone pain Fatigue Fever Headache Nausea This list may not describe all possible side effects. Call your doctor for medical advice about side effects. You may report side effects to FDA at 1-800-FDA-1088. Where should I keep my medication? Keep out of the reach of children and pets. Keep this medication in the original packaging until you are ready to take it. Protect from light. See product for storage information. Each product may have different instructions. Get rid of any unused medication after the expiration date. To get rid of medications that are no longer needed or have expired: Take the medication to a medications take-back program. Check with your pharmacy or law enforcement to find a location. If you cannot return the medication, ask your pharmacist or care team how to get rid of this medication safely. NOTE: This sheet is a summary. It may not cover all possible information. If you have questions about this medicine, talk to your doctor, pharmacist, or health care provider.  2023 Elsevier/Gold Standard (2021-04-08 00:00:00)  

## 2021-09-16 ENCOUNTER — Other Ambulatory Visit: Payer: Self-pay | Admitting: *Deleted

## 2021-09-16 ENCOUNTER — Other Ambulatory Visit: Payer: Self-pay

## 2021-09-16 ENCOUNTER — Telehealth: Payer: Self-pay

## 2021-09-16 ENCOUNTER — Encounter: Payer: Self-pay | Admitting: Nurse Practitioner

## 2021-09-16 ENCOUNTER — Inpatient Hospital Stay: Payer: Medicare HMO

## 2021-09-16 DIAGNOSIS — C8598 Non-Hodgkin lymphoma, unspecified, lymph nodes of multiple sites: Secondary | ICD-10-CM

## 2021-09-16 DIAGNOSIS — Z5189 Encounter for other specified aftercare: Secondary | ICD-10-CM | POA: Diagnosis not present

## 2021-09-16 DIAGNOSIS — D696 Thrombocytopenia, unspecified: Secondary | ICD-10-CM | POA: Diagnosis not present

## 2021-09-16 DIAGNOSIS — D539 Nutritional anemia, unspecified: Secondary | ICD-10-CM | POA: Diagnosis not present

## 2021-09-16 DIAGNOSIS — Z5111 Encounter for antineoplastic chemotherapy: Secondary | ICD-10-CM | POA: Diagnosis not present

## 2021-09-16 DIAGNOSIS — R918 Other nonspecific abnormal finding of lung field: Secondary | ICD-10-CM | POA: Diagnosis not present

## 2021-09-16 DIAGNOSIS — C9000 Multiple myeloma not having achieved remission: Secondary | ICD-10-CM | POA: Diagnosis not present

## 2021-09-16 DIAGNOSIS — Z5112 Encounter for antineoplastic immunotherapy: Secondary | ICD-10-CM | POA: Diagnosis not present

## 2021-09-16 DIAGNOSIS — C8330 Diffuse large B-cell lymphoma, unspecified site: Secondary | ICD-10-CM | POA: Diagnosis not present

## 2021-09-16 DIAGNOSIS — R161 Splenomegaly, not elsewhere classified: Secondary | ICD-10-CM | POA: Diagnosis not present

## 2021-09-16 LAB — CBC WITH DIFFERENTIAL (CANCER CENTER ONLY)
Abs Immature Granulocytes: 0.1 10*3/uL — ABNORMAL HIGH (ref 0.00–0.07)
Basophils Absolute: 0 10*3/uL (ref 0.0–0.1)
Basophils Relative: 0 %
Eosinophils Absolute: 0.1 10*3/uL (ref 0.0–0.5)
Eosinophils Relative: 5 %
HCT: 26.6 % — ABNORMAL LOW (ref 36.0–46.0)
Hemoglobin: 8.8 g/dL — ABNORMAL LOW (ref 12.0–15.0)
Immature Granulocytes: 9 %
Lymphocytes Relative: 28 %
Lymphs Abs: 0.3 10*3/uL — ABNORMAL LOW (ref 0.7–4.0)
MCH: 29.6 pg (ref 26.0–34.0)
MCHC: 33.1 g/dL (ref 30.0–36.0)
MCV: 89.6 fL (ref 80.0–100.0)
Monocytes Absolute: 0.1 10*3/uL (ref 0.1–1.0)
Monocytes Relative: 12 %
Neutro Abs: 0.5 10*3/uL — ABNORMAL LOW (ref 1.7–7.7)
Neutrophils Relative %: 46 %
Platelet Count: 77 10*3/uL — ABNORMAL LOW (ref 150–400)
RBC: 2.97 MIL/uL — ABNORMAL LOW (ref 3.87–5.11)
RDW: 16.7 % — ABNORMAL HIGH (ref 11.5–15.5)
WBC Count: 1.1 10*3/uL — ABNORMAL LOW (ref 4.0–10.5)
nRBC: 0 % (ref 0.0–0.2)

## 2021-09-16 NOTE — Telephone Encounter (Signed)
-----   Message from Owens Shark, NP sent at 09/16/2021  2:26 PM EDT ----- Please let her know the white count is low.  Call for fever, signs of infection.  Repeat CBC on 09/19/2021.  Thanks

## 2021-09-16 NOTE — Telephone Encounter (Signed)
Patient gave verbal understanding and had no further questions or concerns at this time 

## 2021-09-16 NOTE — Telephone Encounter (Signed)
CRITICAL VALUE STICKER  CRITICAL VALUE: ANC 0.5  RECEIVER (on-site recipient of call): Jerene Canny.  DATE & TIME NOTIFIED: 09/16/21 0935  MESSENGER (representative from lab): Otila Kluver  MD NOTIFIED: Benay Spice  TIME OF NOTIFICATION: 09/16/21 0935  RESPONSE:  Dr. Benay Spice is waiting on the CMP result to be final for the next step.

## 2021-09-19 ENCOUNTER — Telehealth: Payer: Self-pay | Admitting: *Deleted

## 2021-09-19 ENCOUNTER — Inpatient Hospital Stay: Payer: Medicare HMO

## 2021-09-19 DIAGNOSIS — C9000 Multiple myeloma not having achieved remission: Secondary | ICD-10-CM | POA: Diagnosis not present

## 2021-09-19 DIAGNOSIS — Z5112 Encounter for antineoplastic immunotherapy: Secondary | ICD-10-CM | POA: Diagnosis not present

## 2021-09-19 DIAGNOSIS — D696 Thrombocytopenia, unspecified: Secondary | ICD-10-CM | POA: Diagnosis not present

## 2021-09-19 DIAGNOSIS — R161 Splenomegaly, not elsewhere classified: Secondary | ICD-10-CM | POA: Diagnosis not present

## 2021-09-19 DIAGNOSIS — C8598 Non-Hodgkin lymphoma, unspecified, lymph nodes of multiple sites: Secondary | ICD-10-CM

## 2021-09-19 DIAGNOSIS — C8330 Diffuse large B-cell lymphoma, unspecified site: Secondary | ICD-10-CM | POA: Diagnosis not present

## 2021-09-19 DIAGNOSIS — Z5189 Encounter for other specified aftercare: Secondary | ICD-10-CM | POA: Diagnosis not present

## 2021-09-19 DIAGNOSIS — Z5111 Encounter for antineoplastic chemotherapy: Secondary | ICD-10-CM | POA: Diagnosis not present

## 2021-09-19 DIAGNOSIS — R918 Other nonspecific abnormal finding of lung field: Secondary | ICD-10-CM | POA: Diagnosis not present

## 2021-09-19 DIAGNOSIS — D539 Nutritional anemia, unspecified: Secondary | ICD-10-CM | POA: Diagnosis not present

## 2021-09-19 LAB — CBC WITH DIFFERENTIAL (CANCER CENTER ONLY)
Abs Immature Granulocytes: 0.04 10*3/uL (ref 0.00–0.07)
Basophils Absolute: 0 10*3/uL (ref 0.0–0.1)
Basophils Relative: 1 %
Eosinophils Absolute: 0.1 10*3/uL (ref 0.0–0.5)
Eosinophils Relative: 7 %
HCT: 25.9 % — ABNORMAL LOW (ref 36.0–46.0)
Hemoglobin: 8.6 g/dL — ABNORMAL LOW (ref 12.0–15.0)
Immature Granulocytes: 2 %
Lymphocytes Relative: 16 %
Lymphs Abs: 0.3 10*3/uL — ABNORMAL LOW (ref 0.7–4.0)
MCH: 29.7 pg (ref 26.0–34.0)
MCHC: 33.2 g/dL (ref 30.0–36.0)
MCV: 89.3 fL (ref 80.0–100.0)
Monocytes Absolute: 0.3 10*3/uL (ref 0.1–1.0)
Monocytes Relative: 14 %
Neutro Abs: 1.1 10*3/uL — ABNORMAL LOW (ref 1.7–7.7)
Neutrophils Relative %: 60 %
Platelet Count: 186 10*3/uL (ref 150–400)
RBC: 2.9 MIL/uL — ABNORMAL LOW (ref 3.87–5.11)
RDW: 17.8 % — ABNORMAL HIGH (ref 11.5–15.5)
WBC Count: 1.8 10*3/uL — ABNORMAL LOW (ref 4.0–10.5)
nRBC: 0 % (ref 0.0–0.2)

## 2021-09-19 NOTE — Telephone Encounter (Signed)
Call to request OK/verbal order for physical therapy: Weekly x 1 week 2/week x 4 weeks 1/week x 4 weeks OK per Ned Card, NP

## 2021-09-22 ENCOUNTER — Other Ambulatory Visit: Payer: Self-pay | Admitting: *Deleted

## 2021-09-22 ENCOUNTER — Ambulatory Visit: Payer: Medicare HMO

## 2021-09-22 DIAGNOSIS — C8598 Non-Hodgkin lymphoma, unspecified, lymph nodes of multiple sites: Secondary | ICD-10-CM

## 2021-09-24 ENCOUNTER — Other Ambulatory Visit: Payer: Self-pay

## 2021-09-26 ENCOUNTER — Other Ambulatory Visit: Payer: Self-pay | Admitting: *Deleted

## 2021-09-26 DIAGNOSIS — C8598 Non-Hodgkin lymphoma, unspecified, lymph nodes of multiple sites: Secondary | ICD-10-CM

## 2021-09-28 ENCOUNTER — Other Ambulatory Visit: Payer: Self-pay | Admitting: Oncology

## 2021-09-28 DIAGNOSIS — C8598 Non-Hodgkin lymphoma, unspecified, lymph nodes of multiple sites: Secondary | ICD-10-CM

## 2021-09-29 ENCOUNTER — Inpatient Hospital Stay: Payer: Medicare HMO | Admitting: Oncology

## 2021-09-29 ENCOUNTER — Inpatient Hospital Stay: Payer: Medicare HMO

## 2021-09-29 ENCOUNTER — Telehealth: Payer: Self-pay | Admitting: *Deleted

## 2021-09-29 VITALS — BP 128/61 | HR 66 | Temp 98.1°F | Resp 20 | Ht 67.0 in | Wt 176.6 lb

## 2021-09-29 DIAGNOSIS — C9000 Multiple myeloma not having achieved remission: Secondary | ICD-10-CM | POA: Diagnosis not present

## 2021-09-29 DIAGNOSIS — C8598 Non-Hodgkin lymphoma, unspecified, lymph nodes of multiple sites: Secondary | ICD-10-CM | POA: Diagnosis not present

## 2021-09-29 DIAGNOSIS — C8330 Diffuse large B-cell lymphoma, unspecified site: Secondary | ICD-10-CM | POA: Diagnosis not present

## 2021-09-29 DIAGNOSIS — R161 Splenomegaly, not elsewhere classified: Secondary | ICD-10-CM | POA: Diagnosis not present

## 2021-09-29 DIAGNOSIS — Z5111 Encounter for antineoplastic chemotherapy: Secondary | ICD-10-CM | POA: Diagnosis not present

## 2021-09-29 DIAGNOSIS — D539 Nutritional anemia, unspecified: Secondary | ICD-10-CM | POA: Diagnosis not present

## 2021-09-29 DIAGNOSIS — Z5112 Encounter for antineoplastic immunotherapy: Secondary | ICD-10-CM | POA: Diagnosis not present

## 2021-09-29 DIAGNOSIS — Z5189 Encounter for other specified aftercare: Secondary | ICD-10-CM | POA: Diagnosis not present

## 2021-09-29 DIAGNOSIS — R918 Other nonspecific abnormal finding of lung field: Secondary | ICD-10-CM | POA: Diagnosis not present

## 2021-09-29 DIAGNOSIS — D696 Thrombocytopenia, unspecified: Secondary | ICD-10-CM | POA: Diagnosis not present

## 2021-09-29 LAB — CMP (CANCER CENTER ONLY)
ALT: 20 U/L (ref 0–44)
AST: 18 U/L (ref 15–41)
Albumin: 3 g/dL — ABNORMAL LOW (ref 3.5–5.0)
Alkaline Phosphatase: 68 U/L (ref 38–126)
Anion gap: 10 (ref 5–15)
BUN: 17 mg/dL (ref 8–23)
CO2: 25 mmol/L (ref 22–32)
Calcium: 8.3 mg/dL — ABNORMAL LOW (ref 8.9–10.3)
Chloride: 105 mmol/L (ref 98–111)
Creatinine: 1.17 mg/dL — ABNORMAL HIGH (ref 0.44–1.00)
GFR, Estimated: 49 mL/min — ABNORMAL LOW (ref >60)
Glucose, Bld: 156 mg/dL — ABNORMAL HIGH (ref 70–99)
Potassium: 3.8 mmol/L (ref 3.5–5.1)
Sodium: 140 mmol/L (ref 135–145)
Total Bilirubin: 0.4 mg/dL (ref 0.3–1.2)
Total Protein: 5.1 g/dL — ABNORMAL LOW (ref 6.5–8.1)

## 2021-09-29 LAB — CBC WITH DIFFERENTIAL (CANCER CENTER ONLY)
Abs Immature Granulocytes: 0.07 10*3/uL (ref 0.00–0.07)
Basophils Absolute: 0.1 10*3/uL (ref 0.0–0.1)
Basophils Relative: 2 %
Eosinophils Absolute: 0.2 10*3/uL (ref 0.0–0.5)
Eosinophils Relative: 7 %
HCT: 24.6 % — ABNORMAL LOW (ref 36.0–46.0)
Hemoglobin: 7.7 g/dL — ABNORMAL LOW (ref 12.0–15.0)
Immature Granulocytes: 2 %
Lymphocytes Relative: 19 %
Lymphs Abs: 0.6 10*3/uL — ABNORMAL LOW (ref 0.7–4.0)
MCH: 29.6 pg (ref 26.0–34.0)
MCHC: 31.3 g/dL (ref 30.0–36.0)
MCV: 94.6 fL (ref 80.0–100.0)
Monocytes Absolute: 0.4 10*3/uL (ref 0.1–1.0)
Monocytes Relative: 13 %
Neutro Abs: 1.9 10*3/uL (ref 1.7–7.7)
Neutrophils Relative %: 57 %
Platelet Count: 336 10*3/uL (ref 150–400)
RBC: 2.6 MIL/uL — ABNORMAL LOW (ref 3.87–5.11)
RDW: 20.8 % — ABNORMAL HIGH (ref 11.5–15.5)
WBC Count: 3.3 10*3/uL — ABNORMAL LOW (ref 4.0–10.5)
nRBC: 0 % (ref 0.0–0.2)

## 2021-09-29 LAB — LACTATE DEHYDROGENASE: LDH: 193 U/L — ABNORMAL HIGH (ref 98–192)

## 2021-09-29 MED ORDER — DOXORUBICIN HCL CHEMO IV INJECTION 2 MG/ML
25.0000 mg/m2 | Freq: Once | INTRAVENOUS | Status: AC
  Administered 2021-09-29: 52 mg via INTRAVENOUS
  Filled 2021-09-29: qty 26

## 2021-09-29 MED ORDER — DIPHENHYDRAMINE HCL 50 MG PO TABS: 50.0000 mg | ORAL_TABLET | Freq: Every evening | ORAL | 0 refills | Status: DC | PRN

## 2021-09-29 MED ORDER — HEPARIN SOD (PORK) LOCK FLUSH 100 UNIT/ML IV SOLN
500.0000 [IU] | Freq: Once | INTRAVENOUS | Status: AC | PRN
  Administered 2021-09-29: 500 [IU]

## 2021-09-29 MED ORDER — SODIUM CHLORIDE 0.9 % IV SOLN
150.0000 mg | Freq: Once | INTRAVENOUS | Status: AC
  Administered 2021-09-29: 150 mg via INTRAVENOUS
  Filled 2021-09-29: qty 5

## 2021-09-29 MED ORDER — POLYETHYLENE GLYCOL 3350 17 G PO PACK: 17.0000 g | PACK | Freq: Every day | ORAL | 0 refills | Status: DC

## 2021-09-29 MED ORDER — SODIUM CHLORIDE 0.9% FLUSH
10.0000 mL | INTRAVENOUS | Status: DC | PRN
  Administered 2021-09-29: 10 mL

## 2021-09-29 MED ORDER — SODIUM CHLORIDE 0.9 % IV SOLN: Freq: Once | INTRAVENOUS | Status: AC

## 2021-09-29 MED ORDER — PALONOSETRON HCL INJECTION 0.25 MG/5ML
0.2500 mg | Freq: Once | INTRAVENOUS | Status: AC
  Administered 2021-09-29: 0.25 mg via INTRAVENOUS
  Filled 2021-09-29: qty 5

## 2021-09-29 MED ORDER — PREDNISONE 20 MG PO TABS: 60.0000 mg | ORAL_TABLET | Freq: Every day | ORAL | 4 refills | Status: DC

## 2021-09-29 MED ORDER — SODIUM CHLORIDE 0.9 % IV SOLN
10.0000 mg | Freq: Once | INTRAVENOUS | Status: AC
  Administered 2021-09-29: 10 mg via INTRAVENOUS
  Filled 2021-09-29: qty 1

## 2021-09-29 MED ORDER — APIXABAN 5 MG PO TABS: 5.0000 mg | ORAL_TABLET | Freq: Two times a day (BID) | ORAL | Status: DC

## 2021-09-29 MED ORDER — SODIUM CHLORIDE 0.9 % IV SOLN
1000.0000 mg | Freq: Once | INTRAVENOUS | Status: AC
  Administered 2021-09-29: 1000 mg via INTRAVENOUS
  Filled 2021-09-29: qty 50

## 2021-09-29 MED ORDER — VINCRISTINE SULFATE CHEMO INJECTION 1 MG/ML
1.0000 mg | Freq: Once | INTRAVENOUS | Status: AC
  Administered 2021-09-29: 1 mg via INTRAVENOUS
  Filled 2021-09-29: qty 1

## 2021-09-29 NOTE — Progress Notes (Signed)
Patient had reaction to first dose of rituximab. Due to length of infusion and appointment time, rituximab will be given d2.

## 2021-09-29 NOTE — Patient Instructions (Signed)
Constipation, Adult--start MiraLax 17 grams ( 1 capful) daily in 8 ounces of fluid. Constipation is when a person has trouble pooping (having a bowel movement). When you have this condition, you may poop fewer than 3 times a week. Your poop (stool) may also be dry, hard, or bigger than normal. Follow these instructions at home: Eating and drinking  Eat foods that have a lot of fiber, such as: Fresh fruits and vegetables. Whole grains. Beans. Eat less of foods that are low in fiber and high in fat and sugar, such as: [place] fries. Hamburgers. Cookies. Candy. Soda. Drink enough fluid to keep your pee (urine) pale yellow. General instructions Exercise regularly or as told by your doctor. Try to do 150 minutes of exercise each week. Go to the restroom when you feel like you need to poop. Do not hold it in. Take over-the-counter and prescription medicines only as told by your doctor. These include any fiber supplements. When you poop: Do deep breathing while relaxing your lower belly (abdomen). Relax your pelvic floor. The pelvic floor is a group of muscles that support the rectum, bladder, and intestines (as well as the uterus in women). Watch your condition for any changes. Tell your doctor if you notice any. Keep all follow-up visits as told by your doctor. This is important. Contact a doctor if: You have pain that gets worse. You have a fever. You have not pooped for 4 days. You vomit. You are not hungry. You lose weight. You are bleeding from the opening of the butt (anus). You have thin, pencil-like poop. Get help right away if: You have a fever, and your symptoms suddenly get worse. You leak poop or have blood in your poop. Your belly feels hard or bigger than normal (bloated). You have very bad belly pain. You feel dizzy or you faint. Summary Constipation is when a person poops fewer than 3 times a week, has trouble pooping, or has poop that is dry, hard, or bigger than  normal. Eat foods that have a lot of fiber. Drink enough fluid to keep your pee (urine) pale yellow. Take over-the-counter and prescription medicines only as told by your doctor. These include any fiber supplements. This information is not intended to replace advice given to you by your health care provider. Make sure you discuss any questions you have with your health care provider. Document Revised: 11/16/2018 Document Reviewed: 11/16/2018 Elsevier Patient Education  [employer].

## 2021-09-29 NOTE — Progress Notes (Signed)
Patient seen by Dr. Benay Spice today  Vitals are within treatment parameters.  Labs reviewed by Dr. Benay Spice and are not all within treatment parameters. Hemoglobin is 7.7  g/dL  Per physician team, patient is ready for treatment and there are NO modifications to the treatment plan. Per MD Benay Spice, ok to treat with Hemoglobin today.

## 2021-09-29 NOTE — Progress Notes (Signed)
[name] OFFICE PROGRESS NOTE   Diagnosis: Non-Hodgkin lymphoma  INTERVAL HISTORY:   [name] returns as scheduled.  She has an improved appetite and energy level.  She has a persistent mild rash at the lower back.  No pruritus.  No sweats.  She is participating in home physical therapy.  No neuropathy symptoms.  Objective:  Vital signs in last 24 hours:  Blood pressure 128/61, pulse 66, temperature 98.1 F (36.7 C), temperature source Oral, resp. rate 20, height 5' 7" (1.702 m), weight 176 lb 9.6 oz (80.1 kg), SpO2 100 %.    HEENT: No thrush, tiny healing ulcers at the right greater than left anterior buccal mucosa Resp: Lungs clear bilaterally Cardio: Regular rate and rhythm GI: No hepatosplenomegaly, nontender Vascular: No leg edema Skin: Mild flat morbilliform erythematous rash at the lower back   Portacath/PICC-without erythema  Lab Results:  Lab Results  Component Value Date   WBC 3.3 (L) 09/29/2021   HGB 7.7 (L) 09/29/2021   HCT 24.6 (L) 09/29/2021   MCV 94.6 09/29/2021   PLT 336 09/29/2021   NEUTROABS 1.9 09/29/2021    CMP  Lab Results  Component Value Date   NA 140 09/29/2021   K 3.8 09/29/2021   CL 105 09/29/2021   CO2 25 09/29/2021   GLUCOSE 156 (H) 09/29/2021   BUN 17 09/29/2021   CREATININE 1.17 (H) 09/29/2021   CALCIUM 8.3 (L) 09/29/2021   PROT 5.1 (L) 09/29/2021   ALBUMIN 3.0 (L) 09/29/2021   AST 18 09/29/2021   ALT 20 09/29/2021   ALKPHOS 68 09/29/2021   BILITOT 0.4 09/29/2021   GFRNONAA 49 (L) 09/29/2021   GFRAA 46 (L) 11/15/2017     Medications: I have reviewed the patient's current medications.   Assessment/Plan:  Large B-cell lymphoma presenting with macrocytic anemia/thrombocytopenia -08/13/2020 B12 3500 -08/13/2020 LDH 612 -08/13/2020 myeloma panel-no monoclonal protein  -Bone marrow biopsy 08/20/2020-hypercellular marrow with erythroid hyperplasia and dyspoiesis, rare lipogranuloma like lesions.  Findings  concerning for a low-grade myelodysplastic syndrome.  Negative myeloma FISH panel, 46XX; neotype myeloid disorders profile NGS sequencing-no pathogenic mutations detected in any of the genes on the NGS panel -[facility] screen - 08/26/2020 -CTs 02/17/2021-new splenomegaly; diffuse bilateral bronchial wall thickening; clustered groundglass and fine nodular opacity in the dependent right lower lobe.  CAD. -Bone marrow biopsy 08/13/2021-hypercellular bone marrow for age with trilineage hematopoiesis; several atypical lymphoid aggregates present; no increase in blastic cells; flow cytometry without significant T or B-cell abnormalities; normal cytogenetics; "the limited morphologic and immunohistochemical features are atypical and a lymphoproliferative process is not excluded" -PET scan 9/32/3557-[facility] hypermetabolic lymphadenopathy in the neck, left axilla, mediastinum, retroperitoneum, and left pelvis.  Hypermetabolic skeletal lesions, generalized bilateral pulmonary hypermetabolism -08/30/2021-1 unit RBCs -Excisional lymph node biopsy left groin 08/30/2021-Large B-cell lymphoma, CD20 positive, CD30 positive -Cycle 1 R-CHOP 09/05/2021, Granix 09/06/2021 for 7 days Fatigue Dyspnea on exertion Unsteady gait/balance disorder, followed by Dr. Tomi Likens CKD Atrial fibrillation Hypertension Sleep apnea Anorexia/weight loss 06/13/2021 skin rash, question vasculitis-course of prednisone initiated; 06/20/2021 marked improvement, rash recurred when she was tapered off of prednisone, prednisone resumed by dermatology History of coagulopathy-most likely related to malnutrition and apixaban Port-A-Cath placement 09/09/2021   Disposition: Ms. Perdue has completed 1 cycle of R-CHOP.  She tolerated the treatment well.  Her clinical status is much improved.  She will complete cycle 2 today.  The plan is to continue R-CHOP with dose reductions.  We will consider dose escalating chemotherapy with cycle 3.  I recommend she resume  apixaban anticoagulation.  Ms. Moree has persistent severe anemia.  She does not appear symptomatic and does not wish to receive a Red cell transfusion at present.  She will return for a repeat CBC on 10/09/2021.  Ms. Macknight return for an office visit and cycle 3 chemotherapy in 3 weeks.  Betsy Coder, MD  09/29/2021  9:37 AM

## 2021-09-29 NOTE — Telephone Encounter (Signed)
Patient was asking for sleep med from MD reporting xanax 0.5 mg not helping. Per Dr. Benay Spice, try OTC Benadryl first and she agrees to try this.

## 2021-09-29 NOTE — Patient Instructions (Signed)
[facility]  Discharge Instructions: Thank you for choosing [facility] to provide your oncology and hematology care.   If you have a lab appointment with the [facility], please go directly to the [facility] and check in at the registration area.   Wear comfortable clothing and clothing appropriate for easy access to any Portacath or PICC line.   We strive to give you quality time with your provider. You may need to reschedule your appointment if you arrive late (15 or more minutes).  Arriving late affects you and other patients whose appointments are after yours.  Also, if you miss three or more appointments without notifying the office, you may be dismissed from the clinic at the provider's discretion.      For prescription refill requests, have your pharmacy contact our office and allow 72 hours for refills to be completed.    Today you received the following chemotherapy and/or immunotherapy agents: doxorubicin, vincristine, cyclophosphamide.       To help prevent nausea and vomiting after your treatment, we encourage you to take your nausea medication as directed.  BELOW ARE SYMPTOMS THAT SHOULD BE REPORTED IMMEDIATELY: *FEVER GREATER THAN 100.4 F (38 C) OR HIGHER *CHILLS OR SWEATING *NAUSEA AND VOMITING THAT IS NOT CONTROLLED WITH YOUR NAUSEA MEDICATION *UNUSUAL SHORTNESS OF BREATH *UNUSUAL BRUISING OR BLEEDING *URINARY PROBLEMS (pain or burning when urinating, or frequent urination) *BOWEL PROBLEMS (unusual diarrhea, constipation, pain near the anus) TENDERNESS IN MOUTH AND THROAT WITH OR WITHOUT PRESENCE OF ULCERS (sore throat, sores in mouth, or a toothache) UNUSUAL RASH, SWELLING OR PAIN  UNUSUAL VAGINAL DISCHARGE OR ITCHING   Items with * indicate a potential emergency and should be followed up as soon as possible or go to the Emergency Department if any problems should occur.  Please show the CHEMOTHERAPY ALERT CARD or  IMMUNOTHERAPY ALERT CARD at check-in to the Emergency Department and triage nurse.  Should you have questions after your visit or need to cancel or reschedule your appointment, please contact [facility]  Dept: [phone]  and follow the prompts.  Office hours are 8:00 a.m. to 4:30 p.m. Monday - Friday. Please note that voicemails left after 4:00 p.m. may not be returned until the following business day.  We are closed weekends and major holidays. You have access to a nurse at all times for urgent questions. Please call the main number to the clinic Dept: [phone] and follow the prompts.   For any non-urgent questions, you may also contact your provider using MyChart. We now offer e-Visits for anyone 76 and older to request care online for non-urgent symptoms. For details visit [email or web address].   Also download the MyChart app! Go to the app store, search "MyChart", open the app, select [facility], and log in with your MyChart username and password.  Masks are optional in the cancer centers. If you would like for your care team to wear a mask while they are taking care of you, please let them know. You may have one support person who is at least 75 years old accompany you for your appointments.  Doxorubicin Injection What is this medication? DOXORUBICIN (dox oh ROO bi sin) treats some types of cancer. It works by slowing down the growth of cancer cells. This medicine may be used for other purposes; ask your health care provider or pharmacist if you have questions. COMMON BRAND NAME(S): Adriamycin, Adriamycin PFS, Adriamycin RDF, Rubex What should I  tell my care team before I take this medication? They need to know if you have any of these conditions: Heart disease History of low blood cell levels caused by a medication Liver disease Recent or ongoing radiation An unusual or allergic reaction to doxorubicin, other medications, foods, dyes, or  preservatives If you or your partner are pregnant or trying to get pregnant Breast-feeding How should I use this medication? This medication is injected into a vein. It is given by your care team in a hospital or clinic setting. Talk to your care team about the use of this medication in children. Special care may be needed. Overdosage: If you think you have taken too much of this medicine contact a poison control center or emergency room at once. NOTE: This medicine is only for you. Do not share this medicine with others. What if I miss a dose? Keep appointments for follow-up doses. It is important not to miss your dose. Call your care team if you are unable to keep an appointment. What may interact with this medication? 6-mercaptopurine Paclitaxel Phenytoin St. John's wort Trastuzumab Verapamil This list may not describe all possible interactions. Give your health care provider a list of all the medicines, herbs, non-prescription drugs, or dietary supplements you use. Also tell them if you smoke, drink alcohol, or use illegal drugs. Some items may interact with your medicine. What should I watch for while using this medication? Your condition will be monitored carefully while you are receiving this medication. You may need blood work while taking this medication. This medication may make you feel generally unwell. This is not uncommon as chemotherapy can affect healthy cells as well as cancer cells. Report any side effects. Continue your course of treatment even though you feel ill unless your care team tells you to stop. There is a maximum amount of this medication you should receive throughout your life. The amount depends on the medical condition being treated and your overall health. Your care team will watch how much of this medication you receive. Tell your care team if you have taken this medication before. Your urine may turn red for a few days after your dose. This is not blood. If  your urine is dark or brown, call your care team. In some cases, you may be given additional medications to help with side effects. Follow all directions for their use. This medication may increase your risk of getting an infection. Call your care team for advice if you get a fever, chills, sore throat, or other symptoms of a cold or flu. Do not treat yourself. Try to avoid being around people who are sick. This medication may increase your risk to bruise or bleed. Call your care team if you notice any unusual bleeding. Talk to your care team about your risk of cancer. You may be more at risk for certain types of cancers if you take this medication. You should make sure that you get enough Coenzyme Q10 while you are taking this medication. Discuss the foods you eat and the vitamins you take with your care team. Talk to your care team if you or your partner may be pregnant. Serious birth defects can occur if you take this medication during pregnancy and for 6 months after the last dose. Contraception is recommended while taking this medication and for 6 months after the last dose. Your care team can help you find the option that works for you. If your partner can get pregnant, use a condom  while taking this medication and for 6 months after the last dose. Do not breastfeed while taking this medication. This medication may cause infertility. Talk to your care team if you are concerned about your fertility. What side effects may I notice from receiving this medication? Side effects that you should report to your care team as soon as possible: Allergic reactions--skin rash, itching, hives, swelling of the face, lips, tongue, or throat Heart failure--shortness of breath, swelling of the ankles, feet, or hands, sudden weight gain, unusual weakness or fatigue Heart rhythm changes--fast or irregular heartbeat, dizziness, feeling faint or lightheaded, chest pain, trouble breathing Infection--fever, chills,  cough, sore throat, wounds that don't heal, pain or trouble when passing urine, general feeling of discomfort or being unwell Low red blood cell level--unusual weakness or fatigue, dizziness, headache, trouble breathing Painful swelling, warmth, or redness of the skin, blisters or sores at the infusion site Unusual bruising or bleeding Side effects that usually do not require medical attention (report to your care team if they continue or are bothersome): Diarrhea Hair loss Nausea Pain, redness, or swelling with sores inside the mouth or throat Red urine This list may not describe all possible side effects. Call your doctor for medical advice about side effects. You may report side effects to FDA at 1-800-FDA-1088. Where should I keep my medication? This medication is given in a hospital or clinic. It will not be stored at home. NOTE: This sheet is a summary. It may not cover all possible information. If you have questions about this medicine, talk to your doctor, pharmacist, or health care provider.  2023 Elsevier/Gold Standard (2021-05-07 00:00:00)  Vincristine Injection What is this medication? VINCRISTINE (vin KRIS teen) treats some types of cancer. It works by slowing down the growth of cancer cells. This medicine may be used for other purposes; ask your health care provider or pharmacist if you have questions. COMMON BRAND NAME(S): Oncovin, Vincasar PFS What should I tell my care team before I take this medication? They need to know if you have any of these conditions: Infection Kidney disease Liver disease Low white blood cell levels Lung disease Nervous system disease, such as Charcot-Marie-Tooth (CMT) Recent or ongoing radiation therapy An unusual or allergic reaction to vincristine, other chemotherapy agents, other medications, foods, dyes, or preservatives Pregnant or trying to get pregnant Breast-feeding How should I use this medication? This medication is infused into  a vein. It is given by your care team in a hospital or clinic setting. Talk to your care team about the use of this medication in children. While it may be given to children for selected conditions, precautions do apply. Overdosage: If you think you have taken too much of this medicine contact a poison control center or emergency room at once. NOTE: This medicine is only for you. Do not share this medicine with others. What if I miss a dose? Keep appointments for follow-up doses. It is important not to miss your dose. Call your care team if you are unable to keep an appointment. What may interact with this medication? Do not take this medication with any of the following: Live virus vaccines This medication may also interact with the following: Medications for fungal infections, such as itraconazole or fluconazole Phenytoin Supplements, such as St. John's wort This list may not describe all possible interactions. Give your health care provider a list of all the medicines, herbs, non-prescription drugs, or dietary supplements you use. Also tell them if you smoke, drink alcohol,  or use illegal drugs. Some items may interact with your medicine. What should I watch for while using this medication? Your condition will be monitored carefully while you are receiving this medication. This medication may make you feel generally unwell. This is not uncommon as chemotherapy can affect healthy cells as well as cancer cells. Report any side effects. Continue your course of treatment even though you feel ill unless your care team tells you to stop. You may need blood work while taking this medication. This medication may increase your risk to bruise or bleed. Call your care team if you notice any unusual bleeding. This medication may increase your risk of getting an infection. Call your care team for advice if you get a fever, chills, sore throat, or other symptoms of a cold or flu. Do not treat yourself. Try  to avoid being around people who are sick. This medication will cause constipation. If you do not have a bowel movement for 3 days, call your care team. Call your care team if you are around anyone with measles, chickenpox, or if you develop sores or blisters that do not heal properly. Be careful brushing or flossing your teeth or using a toothpick because you may get an infection or bleed more easily. If you have any dental work done, tell your dentist you are receiving this medication Talk to your care team if you or your partner wish to become pregnant or think either of you might be pregnant. This medication can cause serious birth defects. This medication may cause infertility. Talk to your care team if you are concerned about your fertility. Talk to your care team before breastfeeding. Changes to your treatment plan may be needed. What side effects may I notice from receiving this medication? Side effects that you should report to your care team as soon as possible: Allergic reactions--skin rash, itching, hives, swelling of the face, lips, tongue, or throat High uric acid level--severe pain, redness, warmth, or swelling in joints, pain or trouble passing urine, pain in the lower back or sides Infection--fever, chills, cough, sore throat, wounds that don't heal, pain or trouble when passing urine, general feeling of discomfort or being unwell Pain, tingling, or numbness in the hands or feet, muscle weakness, change in vision, confusion or trouble speaking, loss of balance or coordination, trouble walking, seizures Painful swelling, warmth, or redness of the skin, blisters or sores at the infusion site Shortness of breath or trouble breathing Side effects that usually do not require medical attention (report to your care team if they continue or are bothersome): Constipation Diarrhea Hair loss Loss of appetite Nausea Stomach cramping Vomiting This list may not describe all possible side  effects. Call your doctor for medical advice about side effects. You may report side effects to FDA at 1-800-FDA-1088. Where should I keep my medication? This medication is given in a hospital or clinic. It will not be stored at home. NOTE: This sheet is a summary. It may not cover all possible information. If you have questions about this medicine, talk to your doctor, pharmacist, or health care provider.  2023 Elsevier/Gold Standard (2007-02-19 00:00:00)  Cyclophosphamide Injection What is this medication? CYCLOPHOSPHAMIDE (sye kloe FOSS fa mide) treats some types of cancer. It works by slowing down the growth of cancer cells. This medicine may be used for other purposes; ask your health care provider or pharmacist if you have questions. COMMON BRAND NAME(S): Cyclophosphamide, Cytoxan, Neosar What should I tell my care team before I  take this medication? They need to know if you have any of these conditions: Heart disease Irregular heartbeat or rhythm Infection Kidney problems Liver disease Low blood cell levels (white cells, platelets, or red blood cells) Lung disease Previous radiation Trouble passing urine An unusual or allergic reaction to cyclophosphamide, other medications, foods, dyes, or preservatives Pregnant or trying to get pregnant Breast-feeding How should I use this medication? This medication is injected into a vein. It is given by your care team in a hospital or clinic setting. Talk to your care team about the use of this medication in children. Special care may be needed. Overdosage: If you think you have taken too much of this medicine contact a poison control center or emergency room at once. NOTE: This medicine is only for you. Do not share this medicine with others. What if I miss a dose? Keep appointments for follow-up doses. It is important not to miss your dose. Call your care team if you are unable to keep an appointment. What may interact with this  medication? Amphotericin B Amiodarone Azathioprine Certain antivirals for HIV or hepatitis Certain medications for blood pressure, such as enalapril, lisinopril, quinapril Cyclosporine Diuretics Etanercept Indomethacin Medications that relax muscles Metronidazole Natalizumab Tamoxifen Warfarin This list may not describe all possible interactions. Give your health care provider a list of all the medicines, herbs, non-prescription drugs, or dietary supplements you use. Also tell them if you smoke, drink alcohol, or use illegal drugs. Some items may interact with your medicine. What should I watch for while using this medication? This medication may make you feel generally unwell. This is not uncommon as chemotherapy can affect healthy cells as well as cancer cells. Report any side effects. Continue your course of treatment even though you feel ill unless your care team tells you to stop. You may need blood work while you are taking this medication. This medication may increase your risk of getting an infection. Call your care team for advice if you get a fever, chills, sore throat, or other symptoms of a cold or flu. Do not treat yourself. Try to avoid being around people who are sick. Avoid taking medications that contain aspirin, acetaminophen, ibuprofen, naproxen, or ketoprofen unless instructed by your care team. These medications may hide a fever. Be careful brushing or flossing your teeth or using a toothpick because you may get an infection or bleed more easily. If you have any dental work done, tell your dentist you are receiving this medication. Drink water or other fluids as directed. Urinate often, even at night. Some products may contain alcohol. Ask your care team if this medication contains alcohol. Be sure to tell all care teams you are taking this medicine. Certain medicines, like metronidazole and disulfiram, can cause an unpleasant reaction when taken with alcohol. The reaction  includes flushing, headache, nausea, vomiting, sweating, and increased thirst. The reaction can last from 30 minutes to several hours. Talk to your care team if you wish to become pregnant or think you might be pregnant. This medication can cause serious birth defects if taken during pregnancy and for 1 year after the last dose. A negative pregnancy test is required before starting this medication. A reliable form of contraception is recommended while taking this medication and for 1 year after the last dose. Talk to your care team about reliable forms of contraception. Do not father a child while taking this medication and for 4 months after the last dose. Use a condom during this  time period. Do not breast-feed while taking this medication or for 1 week after the last dose. This medication may cause infertility. Talk to your care team if you are concerned about your fertility. Talk to your care team about your risk of cancer. You may be more at risk for certain types of cancer if you take this medication. What side effects may I notice from receiving this medication? Side effects that you should report to your care team as soon as possible: Allergic reactions--skin rash, itching, hives, swelling of the face, lips, tongue, or throat Dry cough, shortness of breath or trouble breathing Heart failure--shortness of breath, swelling of the ankles, feet, or hands, sudden weight gain, unusual weakness or fatigue Heart muscle inflammation--unusual weakness or fatigue, shortness of breath, chest pain, fast or irregular heartbeat, dizziness, swelling of the ankles, feet, or hands Heart rhythm changes--fast or irregular heartbeat, dizziness, feeling faint or lightheaded, chest pain, trouble breathing Infection--fever, chills, cough, sore throat, wounds that don't heal, pain or trouble when passing urine, general feeling of discomfort or being unwell Kidney injury--decrease in the amount of urine, swelling of the  ankles, hands, or feet Liver injury--right upper belly pain, loss of appetite, nausea, light-colored stool, dark yellow or brown urine, yellowing skin or eyes, unusual weakness or fatigue Low red blood cell level--unusual weakness or fatigue, dizziness, headache, trouble breathing Low sodium level--muscle weakness, fatigue, dizziness, headache, confusion Red or dark brown urine Unusual bruising or bleeding Side effects that usually do not require medical attention (report to your care team if they continue or are bothersome): Hair loss Irregular menstrual cycles or spotting Loss of appetite Nausea Pain, redness, or swelling with sores inside the mouth or throat Vomiting This list may not describe all possible side effects. Call your doctor for medical advice about side effects. You may report side effects to FDA at 1-800-FDA-1088. Where should I keep my medication? This medication is given in a hospital or clinic. It will not be stored at home. NOTE: This sheet is a summary. It may not cover all possible information. If you have questions about this medicine, talk to your doctor, pharmacist, or health care provider.  2023 Elsevier/Gold Standard (2021-04-09 00:00:00)

## 2021-09-29 NOTE — Patient Instructions (Signed)

## 2021-09-30 ENCOUNTER — Inpatient Hospital Stay: Payer: Medicare HMO

## 2021-09-30 VITALS — BP 119/74 | HR 64 | Temp 97.3°F | Resp 18

## 2021-09-30 DIAGNOSIS — R161 Splenomegaly, not elsewhere classified: Secondary | ICD-10-CM | POA: Diagnosis not present

## 2021-09-30 DIAGNOSIS — Z5112 Encounter for antineoplastic immunotherapy: Secondary | ICD-10-CM | POA: Diagnosis not present

## 2021-09-30 DIAGNOSIS — D696 Thrombocytopenia, unspecified: Secondary | ICD-10-CM | POA: Diagnosis not present

## 2021-09-30 DIAGNOSIS — Z5111 Encounter for antineoplastic chemotherapy: Secondary | ICD-10-CM | POA: Diagnosis not present

## 2021-09-30 DIAGNOSIS — Z5189 Encounter for other specified aftercare: Secondary | ICD-10-CM | POA: Diagnosis not present

## 2021-09-30 DIAGNOSIS — C8598 Non-Hodgkin lymphoma, unspecified, lymph nodes of multiple sites: Secondary | ICD-10-CM

## 2021-09-30 DIAGNOSIS — C9000 Multiple myeloma not having achieved remission: Secondary | ICD-10-CM | POA: Diagnosis not present

## 2021-09-30 DIAGNOSIS — D539 Nutritional anemia, unspecified: Secondary | ICD-10-CM | POA: Diagnosis not present

## 2021-09-30 DIAGNOSIS — Z95828 Presence of other vascular implants and grafts: Secondary | ICD-10-CM

## 2021-09-30 DIAGNOSIS — R918 Other nonspecific abnormal finding of lung field: Secondary | ICD-10-CM | POA: Diagnosis not present

## 2021-09-30 DIAGNOSIS — C8330 Diffuse large B-cell lymphoma, unspecified site: Secondary | ICD-10-CM | POA: Diagnosis not present

## 2021-09-30 MED ORDER — SODIUM CHLORIDE 0.9 % IV SOLN: Freq: Once | INTRAVENOUS | Status: AC

## 2021-09-30 MED ORDER — FAMOTIDINE IN NACL 20-0.9 MG/50ML-% IV SOLN
20.0000 mg | Freq: Once | INTRAVENOUS | Status: AC
  Administered 2021-09-30: 20 mg via INTRAVENOUS
  Filled 2021-09-30: qty 50

## 2021-09-30 MED ORDER — HEPARIN SOD (PORK) LOCK FLUSH 100 UNIT/ML IV SOLN
500.0000 [IU] | Freq: Once | INTRAVENOUS | Status: AC
  Administered 2021-09-30: 500 [IU] via INTRAVENOUS

## 2021-09-30 MED ORDER — SODIUM CHLORIDE 0.9 % IV SOLN
10.0000 mg | Freq: Once | INTRAVENOUS | Status: AC
  Administered 2021-09-30: 10 mg via INTRAVENOUS
  Filled 2021-09-30: qty 10

## 2021-09-30 MED ORDER — SODIUM CHLORIDE 0.9% FLUSH
10.0000 mL | Freq: Once | INTRAVENOUS | Status: AC
  Administered 2021-09-30: 10 mL via INTRAVENOUS

## 2021-09-30 MED ORDER — PEGFILGRASTIM-CBQV 6 MG/0.6ML ~~LOC~~ SOSY
6.0000 mg | PREFILLED_SYRINGE | Freq: Once | SUBCUTANEOUS | Status: AC
  Administered 2021-09-30: 6 mg via SUBCUTANEOUS
  Filled 2021-09-30: qty 0.6

## 2021-09-30 MED ORDER — SODIUM CHLORIDE 0.9 % IV SOLN
375.0000 mg/m2 | Freq: Once | INTRAVENOUS | Status: AC
  Administered 2021-09-30: 800 mg via INTRAVENOUS
  Filled 2021-09-30: qty 50

## 2021-09-30 MED ORDER — ACETAMINOPHEN 325 MG PO TABS
650.0000 mg | ORAL_TABLET | Freq: Once | ORAL | Status: AC
  Administered 2021-09-30: 650 mg via ORAL
  Filled 2021-09-30: qty 2

## 2021-09-30 MED ORDER — SODIUM CHLORIDE 0.9 % IV SOLN
375.0000 mg/m2 | Freq: Once | INTRAVENOUS | Status: DC
  Filled 2021-09-30 (×2): qty 80

## 2021-09-30 MED ORDER — DIPHENHYDRAMINE HCL 25 MG PO CAPS
50.0000 mg | ORAL_CAPSULE | Freq: Once | ORAL | Status: AC
  Administered 2021-09-30: 50 mg via ORAL
  Filled 2021-09-30: qty 2

## 2021-09-30 NOTE — Patient Instructions (Addendum)
Please start taking predisone on 10/01/21.   Also please take claritan on 09/30/21 and once a day for the next 3 days to decreased side effects from the udenyca injection.   [place]  Discharge Instructions: Thank you for choosing [facility] to provide your oncology and hematology care.   If you have a lab appointment with the [facility], please go directly to the [facility] and check in at the registration area.   Wear comfortable clothing and clothing appropriate for easy access to any Portacath or PICC line.   We strive to give you quality time with your provider. You may need to reschedule your appointment if you arrive late (15 or more minutes).  Arriving late affects you and other patients whose appointments are after yours.  Also, if you miss three or more appointments without notifying the office, you may be dismissed from the clinic at the provider's discretion.      For prescription refill requests, have your pharmacy contact our office and allow 72 hours for refills to be completed.    Today you received the following chemotherapy and/or immunotherapy agents: rituximab      To help prevent nausea and vomiting after your treatment, we encourage you to take your nausea medication as directed.  BELOW ARE SYMPTOMS THAT SHOULD BE REPORTED IMMEDIATELY: *FEVER GREATER THAN 100.4 F (38 C) OR HIGHER *CHILLS OR SWEATING *NAUSEA AND VOMITING THAT IS NOT CONTROLLED WITH YOUR NAUSEA MEDICATION *UNUSUAL SHORTNESS OF BREATH *UNUSUAL BRUISING OR BLEEDING *URINARY PROBLEMS (pain or burning when urinating, or frequent urination) *BOWEL PROBLEMS (unusual diarrhea, constipation, pain near the anus) TENDERNESS IN MOUTH AND THROAT WITH OR WITHOUT PRESENCE OF ULCERS (sore throat, sores in mouth, or a toothache) UNUSUAL RASH, SWELLING OR PAIN  UNUSUAL VAGINAL DISCHARGE OR ITCHING   Items with * indicate a potential emergency and should be followed  up as soon as possible or go to the Emergency Department if any problems should occur.  Please show the CHEMOTHERAPY ALERT CARD or IMMUNOTHERAPY ALERT CARD at check-in to the Emergency Department and triage nurse.  Should you have questions after your visit or need to cancel or reschedule your appointment, please contact [facility]  Dept: [phone]  and follow the prompts.  Office hours are 8:00 a.m. to 4:30 p.m. Monday - Friday. Please note that voicemails left after 4:00 p.m. may not be returned until the following business day.  We are closed weekends and major holidays. You have access to a nurse at all times for urgent questions. Please call the main number to the clinic Dept: [phone] and follow the prompts.   For any non-urgent questions, you may also contact your provider using MyChart. We now offer e-Visits for anyone 52 and older to request care online for non-urgent symptoms. For details visit [email or web address].   Also download the MyChart app! Go to the app store, search "MyChart", open the app, select [facility], and log in with your MyChart username and password.  Masks are optional in the cancer centers. If you would like for your care team to wear a mask while they are taking care of you, please let them know. You may have one support person who is at least 75 years old accompany you for your appointments.  Rituximab Injection What is this medication? RITUXIMAB (ri TUX i mab) treats leukemia and lymphoma. It works by blocking a protein that causes cancer cells to grow and  multiply. This helps to slow or stop the spread of cancer cells. It may also be used to treat autoimmune conditions, such as arthritis. It works by slowing down an overactive immune system. It is a monoclonal antibody. This medicine may be used for other purposes; ask your health care provider or pharmacist if you have questions. COMMON BRAND NAME(S): RIABNI, Rituxan,  RUXIENCE, truxima What should I tell my care team before I take this medication? They need to know if you have any of these conditions: Chest pain Heart disease Immune system problems Infection, such as chickenpox, cold sores, hepatitis B, herpes Irregular heartbeat or rhythm Kidney disease Low blood counts, such as low white cells, platelets, red cells Lung disease Recent or upcoming vaccine An unusual or allergic reaction to rituximab, other medications, foods, dyes, or preservatives Pregnant or trying to get pregnant Breast-feeding How should I use this medication? This medication is injected into a vein. It is given by a care team in a hospital or clinic setting. A special MedGuide will be given to you before each treatment. Be sure to read this information carefully each time. Talk to your care team about the use of this medication in children. While this medication may be prescribed for children as young as 6 months for selected conditions, precautions do apply. Overdosage: If you think you have taken too much of this medicine contact a poison control center or emergency room at once. NOTE: This medicine is only for you. Do not share this medicine with others. What if I miss a dose? Keep appointments for follow-up doses. It is important not to miss your dose. Call your care team if you are unable to keep an appointment. What may interact with this medication? Do not take this medication with any of the following: Live vaccines This medication may also interact with the following: Cisplatin This list may not describe all possible interactions. Give your health care provider a list of all the medicines, herbs, non-prescription drugs, or dietary supplements you use. Also tell them if you smoke, drink alcohol, or use illegal drugs. Some items may interact with your medicine. What should I watch for while using this medication? Your condition will be monitored carefully while you are  receiving this medication. You may need blood work while taking this medication. This medication can cause serious infusion reactions. To reduce the risk your care team may give you other medications to take before receiving this one. Be sure to follow the directions from your care team. This medication may increase your risk of getting an infection. Call your care team for advice if you get a fever, chills, sore throat, or other symptoms of a cold or flu. Do not treat yourself. Try to avoid being around people who are sick. Call your care team if you are around anyone with measles, chickenpox, or if you develop sores or blisters that do not heal properly. Avoid taking medications that contain aspirin, acetaminophen, ibuprofen, naproxen, or ketoprofen unless instructed by your care team. These medications may hide a fever. This medication may cause serious skin reactions. They can happen weeks to months after starting the medication. Contact your care team right away if you notice fevers or flu-like symptoms with a rash. The rash may be red or purple and then turn into blisters or peeling of the skin. You may also notice a red rash with swelling of the face, lips, or lymph nodes in your neck or under your arms. In some patients, this  medication may cause a serious brain infection that may cause death. If you have any problems seeing, thinking, speaking, walking, or standing, tell your care team right away. If you cannot reach your care team, urgently seek another source of medical care. Talk to your care team if you may be pregnant. Serious birth defects can occur if you take this medication during pregnancy and for 12 months after the last dose. You will need a negative pregnancy test before starting this medication. Contraception is recommended while taking this medication and for 12 months after the last dose. Your care team can help you find the option that works for you. Do not breastfeed while taking  this medication and for at least 6 months after the last dose. What side effects may I notice from receiving this medication? Side effects that you should report to your care team as soon as possible: Allergic reactions or angioedema--skin rash, itching or hives, swelling of the face, eyes, lips, tongue, arms, or legs, trouble swallowing or breathing Bowel blockage--stomach cramping, unable to have a bowel movement or pass gas, loss of appetite, vomiting Dizziness, loss of balance or coordination, confusion or trouble speaking Heart attack--pain or tightness in the chest, shoulders, arms, or jaw, nausea, shortness of breath, cold or clammy skin, feeling faint or lightheaded Heart rhythm changes--fast or irregular heartbeat, dizziness, feeling faint or lightheaded, chest pain, trouble breathing Infection--fever, chills, cough, sore throat, wounds that don't heal, pain or trouble when passing urine, general feeling of discomfort or being unwell Infusion reactions--chest pain, shortness of breath or trouble breathing, feeling faint or lightheaded Kidney injury--decrease in the amount of urine, swelling of the ankles, hands, or feet Liver injury--right upper belly pain, loss of appetite, nausea, light-colored stool, dark yellow or brown urine, yellowing skin or eyes, unusual weakness or fatigue Redness, blistering, peeling, or loosening of the skin, including inside the mouth Stomach pain that is severe, does not go away, or gets worse Tumor lysis syndrome (TLS)--nausea, vomiting, diarrhea, decrease in the amount of urine, dark urine, unusual weakness or fatigue, confusion, muscle pain or cramps, fast or irregular heartbeat, joint pain Side effects that usually do not require medical attention (report to your care team if they continue or are bothersome): Headache Joint pain Nausea Runny or stuffy nose Unusual weakness or fatigue This list may not describe all possible side effects. Call your doctor  for medical advice about side effects. You may report side effects to FDA at 1-800-FDA-1088. Where should I keep my medication? This medication is given in a hospital or clinic. It will not be stored at home. NOTE: This sheet is a summary. It may not cover all possible information. If you have questions about this medicine, talk to your doctor, pharmacist, or health care provider.  2023 Elsevier/Gold Standard (2021-05-19 00:00:00)  Pegfilgrastim Injection What is this medication? PEGFILGRASTIM (PEG fil gra stim) lowers the risk of infection in people who are receiving chemotherapy. It works by Building control surveyor make more white blood cells, which protects your body from infection. It may also be used to help people who have been exposed to high doses of radiation. This medicine may be used for other purposes; ask your health care provider or pharmacist if you have questions. COMMON BRAND NAME(S): [place], Neulasta, Nyvepria, Stimufend, UDENYCA, Ziextenzo What should I tell my care team before I take this medication? They need to know if you have any of these conditions: Kidney disease Latex allergy Ongoing radiation therapy Sickle cell  disease Skin reactions to acrylic adhesives (On-Body Injector only) An unusual or allergic reaction to pegfilgrastim, filgrastim, other medications, foods, dyes, or preservatives Pregnant or trying to get pregnant Breast-feeding How should I use this medication? This medication is for injection under the skin. If you get this medication at home, you will be taught how to prepare and give the pre-filled syringe or how to use the On-body Injector. Refer to the patient Instructions for Use for detailed instructions. Use exactly as directed. Tell your care team immediately if you suspect that the On-body Injector may not have performed as intended or if you suspect the use of the On-body Injector resulted in a missed or partial dose. It is important that  you put your used needles and syringes in a special sharps container. Do not put them in a trash can. If you do not have a sharps container, call your pharmacist or care team to get one. Talk to your care team about the use of this medication in children. While this medication may be prescribed for selected conditions, precautions do apply. Overdosage: If you think you have taken too much of this medicine contact a poison control center or emergency room at once. NOTE: This medicine is only for you. Do not share this medicine with others. What if I miss a dose? It is important not to miss your dose. Call your care team if you miss your dose. If you miss a dose due to an On-body Injector failure or leakage, a new dose should be administered as soon as possible using a single prefilled syringe for manual use. What may interact with this medication? Interactions have not been studied. This list may not describe all possible interactions. Give your health care provider a list of all the medicines, herbs, non-prescription drugs, or dietary supplements you use. Also tell them if you smoke, drink alcohol, or use illegal drugs. Some items may interact with your medicine. What should I watch for while using this medication? Your condition will be monitored carefully while you are receiving this medication. You may need blood work done while you are taking this medication. Talk to your care team about your risk of cancer. You may be more at risk for certain types of cancer if you take this medication. If you are going to need a MRI, CT scan, or other procedure, tell your care team that you are using this medication (On-Body Injector only). What side effects may I notice from receiving this medication? Side effects that you should report to your care team as soon as possible: Allergic reactions--skin rash, itching, hives, swelling of the face, lips, tongue, or throat Capillary leak syndrome--stomach or muscle  pain, unusual weakness or fatigue, feeling faint or lightheaded, decrease in the amount of urine, swelling of the ankles, hands, or feet, trouble breathing High white blood cell level--fever, fatigue, trouble breathing, night sweats, change in vision, weight loss Inflammation of the aorta--fever, fatigue, back, chest, or stomach pain, severe headache Kidney injury (glomerulonephritis)--decrease in the amount of urine, red or dark brown urine, foamy or bubbly urine, swelling of the ankles, hands, or feet Shortness of breath or trouble breathing Spleen injury--pain in upper left stomach or shoulder Unusual bruising or bleeding Side effects that usually do not require medical attention (report to your care team if they continue or are bothersome): Bone pain Pain in the hands or feet This list may not describe all possible side effects. Call your doctor for medical advice about side effects.  You may report side effects to FDA at 1-800-FDA-1088. Where should I keep my medication? Keep out of the reach of children. If you are using this medication at home, you will be instructed on how to store it. Throw away any unused medication after the expiration date on the label. NOTE: This sheet is a summary. It may not cover all possible information. If you have questions about this medicine, talk to your doctor, pharmacist, or health care provider.  2023 Elsevier/Gold Standard (2013-03-31 00:00:00)  Pegfilgrastim Injection What is this medication? PEGFILGRASTIM (PEG fil gra stim) lowers the risk of infection in people who are receiving chemotherapy. It works by Building control surveyor make more white blood cells, which protects your body from infection. It may also be used to help people who have been exposed to high doses of radiation. This medicine may be used for other purposes; ask your health care provider or pharmacist if you have questions. COMMON BRAND NAME(S): Georgian Co, Neulasta, Nyvepria,  Stimufend, UDENYCA, Ziextenzo What should I tell my care team before I take this medication? They need to know if you have any of these conditions: Kidney disease Latex allergy Ongoing radiation therapy Sickle cell disease Skin reactions to acrylic adhesives (On-Body Injector only) An unusual or allergic reaction to pegfilgrastim, filgrastim, other medications, foods, dyes, or preservatives Pregnant or trying to get pregnant Breast-feeding How should I use this medication? This medication is for injection under the skin. If you get this medication at home, you will be taught how to prepare and give the pre-filled syringe or how to use the On-body Injector. Refer to the patient Instructions for Use for detailed instructions. Use exactly as directed. Tell your care team immediately if you suspect that the On-body Injector may not have performed as intended or if you suspect the use of the On-body Injector resulted in a missed or partial dose. It is important that you put your used needles and syringes in a special sharps container. Do not put them in a trash can. If you do not have a sharps container, call your pharmacist or care team to get one. Talk to your care team about the use of this medication in children. While this medication may be prescribed for selected conditions, precautions do apply. Overdosage: If you think you have taken too much of this medicine contact a poison control center or emergency room at once. NOTE: This medicine is only for you. Do not share this medicine with others. What if I miss a dose? It is important not to miss your dose. Call your care team if you miss your dose. If you miss a dose due to an On-body Injector failure or leakage, a new dose should be administered as soon as possible using a single prefilled syringe for manual use. What may interact with this medication? Interactions have not been studied. This list may not describe all possible interactions.  Give your health care provider a list of all the medicines, herbs, non-prescription drugs, or dietary supplements you use. Also tell them if you smoke, drink alcohol, or use illegal drugs. Some items may interact with your medicine. What should I watch for while using this medication? Your condition will be monitored carefully while you are receiving this medication. You may need blood work done while you are taking this medication. Talk to your care team about your risk of cancer. You may be more at risk for certain types of cancer if you take this medication. If you are  going to need a MRI, CT scan, or other procedure, tell your care team that you are using this medication (On-Body Injector only). What side effects may I notice from receiving this medication? Side effects that you should report to your care team as soon as possible: Allergic reactions--skin rash, itching, hives, swelling of the face, lips, tongue, or throat Capillary leak syndrome--stomach or muscle pain, unusual weakness or fatigue, feeling faint or lightheaded, decrease in the amount of urine, swelling of the ankles, hands, or feet, trouble breathing High white blood cell level--fever, fatigue, trouble breathing, night sweats, change in vision, weight loss Inflammation of the aorta--fever, fatigue, back, chest, or stomach pain, severe headache Kidney injury (glomerulonephritis)--decrease in the amount of urine, red or dark brown urine, foamy or bubbly urine, swelling of the ankles, hands, or feet Shortness of breath or trouble breathing Spleen injury--pain in upper left stomach or shoulder Unusual bruising or bleeding Side effects that usually do not require medical attention (report to your care team if they continue or are bothersome): Bone pain Pain in the hands or feet This list may not describe all possible side effects. Call your doctor for medical advice about side effects. You may report side effects to FDA at  1-800-FDA-1088. Where should I keep my medication? Keep out of the reach of children. If you are using this medication at home, you will be instructed on how to store it. Throw away any unused medication after the expiration date on the label. NOTE: This sheet is a summary. It may not cover all possible information. If you have questions about this medicine, talk to your doctor, pharmacist, or health care provider.  2023 Elsevier/Gold Standard (2013-03-31 00:00:00)

## 2021-10-01 ENCOUNTER — Other Ambulatory Visit: Payer: Self-pay

## 2021-10-02 ENCOUNTER — Telehealth: Payer: Self-pay

## 2021-10-02 NOTE — Telephone Encounter (Signed)
The patient has been diagnosed with Non-Hodgkin lymphoma since being seen by the [facility] team. She was scheduled for LAAO and that was cancelled due to new diagnosis and current treatment.  While she would like to proceed with [facility], she understands she should not have the procedure now.   She will keep her appointment with Dr. Marlou Porch 9/28.  I will call her after her treatments are over. If she would still like to proceed, will arrange with Dr. Quentin Ore to discuss.  She was grateful for call and agrees with plan.

## 2021-10-09 ENCOUNTER — Ambulatory Visit: Payer: Medicare HMO | Admitting: Cardiology

## 2021-10-09 ENCOUNTER — Inpatient Hospital Stay (HOSPITAL_COMMUNITY): Admission: RE | Admit: 2021-10-09 | Payer: Medicare HMO | Source: Home / Self Care | Admitting: Cardiology

## 2021-10-09 ENCOUNTER — Inpatient Hospital Stay: Payer: Medicare HMO

## 2021-10-09 DIAGNOSIS — I4891 Unspecified atrial fibrillation: Secondary | ICD-10-CM

## 2021-10-09 SURGERY — LEFT ATRIAL APPENDAGE OCCLUSION
Anesthesia: General

## 2021-10-10 ENCOUNTER — Telehealth: Payer: Self-pay

## 2021-10-10 NOTE — Telephone Encounter (Signed)
[name] [name] called in stated she have not had a bowel movement over 5 days. She had tried miralax, stool softener, suppository, and fleet enema and nothing happen. Per Dr. Benay Spice she can try magnesium citrate and given [place] a call if the doesn't work. Patient gave verbal understanding and had no further questions or concerns at this time.

## 2021-10-15 ENCOUNTER — Telehealth: Payer: Self-pay | Admitting: Nutrition

## 2021-10-15 ENCOUNTER — Inpatient Hospital Stay: Payer: Medicare HMO | Attending: Oncology | Admitting: Nutrition

## 2021-10-15 ENCOUNTER — Encounter: Payer: Self-pay | Admitting: Nurse Practitioner

## 2021-10-15 DIAGNOSIS — Z79899 Other long term (current) drug therapy: Secondary | ICD-10-CM | POA: Insufficient documentation

## 2021-10-15 DIAGNOSIS — I2510 Atherosclerotic heart disease of native coronary artery without angina pectoris: Secondary | ICD-10-CM | POA: Insufficient documentation

## 2021-10-15 DIAGNOSIS — R2681 Unsteadiness on feet: Secondary | ICD-10-CM | POA: Insufficient documentation

## 2021-10-15 DIAGNOSIS — I129 Hypertensive chronic kidney disease with stage 1 through stage 4 chronic kidney disease, or unspecified chronic kidney disease: Secondary | ICD-10-CM | POA: Insufficient documentation

## 2021-10-15 DIAGNOSIS — R5383 Other fatigue: Secondary | ICD-10-CM | POA: Insufficient documentation

## 2021-10-15 DIAGNOSIS — R0609 Other forms of dyspnea: Secondary | ICD-10-CM | POA: Insufficient documentation

## 2021-10-15 DIAGNOSIS — R634 Abnormal weight loss: Secondary | ICD-10-CM | POA: Insufficient documentation

## 2021-10-15 DIAGNOSIS — Z5112 Encounter for antineoplastic immunotherapy: Secondary | ICD-10-CM | POA: Insufficient documentation

## 2021-10-15 DIAGNOSIS — K1379 Other lesions of oral mucosa: Secondary | ICD-10-CM | POA: Insufficient documentation

## 2021-10-15 DIAGNOSIS — R630 Anorexia: Secondary | ICD-10-CM | POA: Insufficient documentation

## 2021-10-15 DIAGNOSIS — D696 Thrombocytopenia, unspecified: Secondary | ICD-10-CM | POA: Insufficient documentation

## 2021-10-15 DIAGNOSIS — R161 Splenomegaly, not elsewhere classified: Secondary | ICD-10-CM | POA: Insufficient documentation

## 2021-10-15 DIAGNOSIS — Z5189 Encounter for other specified aftercare: Secondary | ICD-10-CM | POA: Insufficient documentation

## 2021-10-15 DIAGNOSIS — C8330 Diffuse large B-cell lymphoma, unspecified site: Secondary | ICD-10-CM | POA: Insufficient documentation

## 2021-10-15 DIAGNOSIS — D539 Nutritional anemia, unspecified: Secondary | ICD-10-CM | POA: Insufficient documentation

## 2021-10-15 DIAGNOSIS — N183 Chronic kidney disease, stage 3 unspecified: Secondary | ICD-10-CM | POA: Insufficient documentation

## 2021-10-15 DIAGNOSIS — K5900 Constipation, unspecified: Secondary | ICD-10-CM | POA: Insufficient documentation

## 2021-10-15 DIAGNOSIS — Z5111 Encounter for antineoplastic chemotherapy: Secondary | ICD-10-CM | POA: Insufficient documentation

## 2021-10-15 DIAGNOSIS — R918 Other nonspecific abnormal finding of lung field: Secondary | ICD-10-CM | POA: Insufficient documentation

## 2021-10-15 DIAGNOSIS — I4891 Unspecified atrial fibrillation: Secondary | ICD-10-CM | POA: Insufficient documentation

## 2021-10-15 DIAGNOSIS — G4730 Sleep apnea, unspecified: Secondary | ICD-10-CM | POA: Insufficient documentation

## 2021-10-15 DIAGNOSIS — R590 Localized enlarged lymph nodes: Secondary | ICD-10-CM | POA: Insufficient documentation

## 2021-10-15 DIAGNOSIS — R21 Rash and other nonspecific skin eruption: Secondary | ICD-10-CM | POA: Insufficient documentation

## 2021-10-15 NOTE — Telephone Encounter (Signed)
Contacted patient by telephone for nutrition referral per patient request.  76 year old female diagnosed with NHL and followed by Dr. Benay Spice. She has persistent anemia and is receiving R-CHOP.  PMH includes A-fib, CKD, GERD, HLD, HTN, Pre-DM  Medications include Xanax, Wellbutrin XL, Pepcid, Lasix, Synthroid, Magnesium, Remeron.  Labs include Glucose 156 and Creatinine 1.17 on Sept 18.  Height: 5'7". Weight: 176.6 pounds. UBW: 240 pounds 2.5 years ago per patient. BMI: 27.56  Patient reports her appetite is good and her weight has been stable. She is having taste alterations and finds it difficult to drink a lot of liquids other than chocolate milk, tea and water. She has been working on increasing her water intake. Patient states her constipation improved after magnesium citrate. She dislikes "all whole grains" but is open to adding vegetables, fruits and pinto beans for added fiber. She is concerned that her feet are swelling and says this never happened before chemotherapy.  Nutrition Diagnosis: Unintended wt loss related to cancer and associated treatments as evidenced by 27% loss from UBW. Patient has had 5% loss over the past 3 months.  Intervention: Educated on strategies for increasing fiber and fluid. Encouraged adequate protein and suggested small, frequent meals/snacks. Educated on taste alterations. Notified provider regarding patient concern with feet swelling. Mailed nutrition fact sheets with contact information.  Monitoring, Evaluation, Goals: Patient will tolerate adequate calories and protein to minimize wt loss throughout treatment.  Next Visit: Patient prefers to contact RD for questions.

## 2021-10-16 ENCOUNTER — Telehealth: Payer: Self-pay | Admitting: *Deleted

## 2021-10-16 NOTE — Telephone Encounter (Signed)
[name] [name] reports she noted a golf ball sized mass in left groin when she lays on this side. It goes down when she stands. Not red or painful and leg is not swollen. Per Dr. Benay Spice: most likely a seroma and should resolve over time. Will assess at there visit on 10/20/21

## 2021-10-19 ENCOUNTER — Other Ambulatory Visit: Payer: Self-pay | Admitting: Oncology

## 2021-10-20 ENCOUNTER — Other Ambulatory Visit: Payer: Self-pay

## 2021-10-20 ENCOUNTER — Inpatient Hospital Stay: Payer: Medicare HMO

## 2021-10-20 ENCOUNTER — Inpatient Hospital Stay (HOSPITAL_BASED_OUTPATIENT_CLINIC_OR_DEPARTMENT_OTHER): Payer: Medicare HMO | Admitting: Nurse Practitioner

## 2021-10-20 ENCOUNTER — Encounter: Payer: Self-pay | Admitting: Cardiology

## 2021-10-20 ENCOUNTER — Encounter: Payer: Self-pay | Admitting: Nurse Practitioner

## 2021-10-20 VITALS — BP 127/58 | HR 78 | Temp 98.1°F | Resp 18 | Ht 67.0 in | Wt 197.0 lb

## 2021-10-20 VITALS — BP 139/68 | HR 66 | Temp 96.8°F | Resp 18

## 2021-10-20 DIAGNOSIS — G4730 Sleep apnea, unspecified: Secondary | ICD-10-CM | POA: Diagnosis not present

## 2021-10-20 DIAGNOSIS — R21 Rash and other nonspecific skin eruption: Secondary | ICD-10-CM | POA: Diagnosis not present

## 2021-10-20 DIAGNOSIS — R630 Anorexia: Secondary | ICD-10-CM | POA: Diagnosis not present

## 2021-10-20 DIAGNOSIS — C8598 Non-Hodgkin lymphoma, unspecified, lymph nodes of multiple sites: Secondary | ICD-10-CM

## 2021-10-20 DIAGNOSIS — Z5189 Encounter for other specified aftercare: Secondary | ICD-10-CM | POA: Diagnosis not present

## 2021-10-20 DIAGNOSIS — R634 Abnormal weight loss: Secondary | ICD-10-CM | POA: Diagnosis not present

## 2021-10-20 DIAGNOSIS — Z5112 Encounter for antineoplastic immunotherapy: Secondary | ICD-10-CM | POA: Diagnosis not present

## 2021-10-20 DIAGNOSIS — I129 Hypertensive chronic kidney disease with stage 1 through stage 4 chronic kidney disease, or unspecified chronic kidney disease: Secondary | ICD-10-CM | POA: Diagnosis not present

## 2021-10-20 DIAGNOSIS — I2510 Atherosclerotic heart disease of native coronary artery without angina pectoris: Secondary | ICD-10-CM | POA: Diagnosis not present

## 2021-10-20 DIAGNOSIS — K1379 Other lesions of oral mucosa: Secondary | ICD-10-CM | POA: Diagnosis not present

## 2021-10-20 DIAGNOSIS — I4891 Unspecified atrial fibrillation: Secondary | ICD-10-CM | POA: Diagnosis not present

## 2021-10-20 DIAGNOSIS — R161 Splenomegaly, not elsewhere classified: Secondary | ICD-10-CM | POA: Diagnosis not present

## 2021-10-20 DIAGNOSIS — D696 Thrombocytopenia, unspecified: Secondary | ICD-10-CM | POA: Diagnosis not present

## 2021-10-20 DIAGNOSIS — N183 Chronic kidney disease, stage 3 unspecified: Secondary | ICD-10-CM | POA: Diagnosis not present

## 2021-10-20 DIAGNOSIS — Z5111 Encounter for antineoplastic chemotherapy: Secondary | ICD-10-CM | POA: Diagnosis not present

## 2021-10-20 DIAGNOSIS — R2681 Unsteadiness on feet: Secondary | ICD-10-CM | POA: Diagnosis not present

## 2021-10-20 DIAGNOSIS — K5900 Constipation, unspecified: Secondary | ICD-10-CM | POA: Diagnosis not present

## 2021-10-20 DIAGNOSIS — D539 Nutritional anemia, unspecified: Secondary | ICD-10-CM | POA: Diagnosis not present

## 2021-10-20 DIAGNOSIS — C8330 Diffuse large B-cell lymphoma, unspecified site: Secondary | ICD-10-CM | POA: Diagnosis not present

## 2021-10-20 DIAGNOSIS — Z79899 Other long term (current) drug therapy: Secondary | ICD-10-CM | POA: Diagnosis not present

## 2021-10-20 DIAGNOSIS — R5383 Other fatigue: Secondary | ICD-10-CM | POA: Diagnosis not present

## 2021-10-20 DIAGNOSIS — R0609 Other forms of dyspnea: Secondary | ICD-10-CM | POA: Diagnosis not present

## 2021-10-20 DIAGNOSIS — R918 Other nonspecific abnormal finding of lung field: Secondary | ICD-10-CM | POA: Diagnosis not present

## 2021-10-20 DIAGNOSIS — R590 Localized enlarged lymph nodes: Secondary | ICD-10-CM | POA: Diagnosis not present

## 2021-10-20 LAB — CBC WITH DIFFERENTIAL (CANCER CENTER ONLY)
Abs Immature Granulocytes: 0.05 10*3/uL (ref 0.00–0.07)
Basophils Absolute: 0.1 10*3/uL (ref 0.0–0.1)
Basophils Relative: 2 %
Eosinophils Absolute: 0.2 10*3/uL (ref 0.0–0.5)
Eosinophils Relative: 6 %
HCT: 25.4 % — ABNORMAL LOW (ref 36.0–46.0)
Hemoglobin: 7.6 g/dL — ABNORMAL LOW (ref 12.0–15.0)
Immature Granulocytes: 2 %
Lymphocytes Relative: 17 %
Lymphs Abs: 0.5 10*3/uL — ABNORMAL LOW (ref 0.7–4.0)
MCH: 31.5 pg (ref 26.0–34.0)
MCHC: 29.9 g/dL — ABNORMAL LOW (ref 30.0–36.0)
MCV: 105.4 fL — ABNORMAL HIGH (ref 80.0–100.0)
Monocytes Absolute: 0.4 10*3/uL (ref 0.1–1.0)
Monocytes Relative: 12 %
Neutro Abs: 2 10*3/uL (ref 1.7–7.7)
Neutrophils Relative %: 61 %
Platelet Count: 204 10*3/uL (ref 150–400)
RBC: 2.41 MIL/uL — ABNORMAL LOW (ref 3.87–5.11)
RDW: 25.7 % — ABNORMAL HIGH (ref 11.5–15.5)
WBC Count: 3.2 10*3/uL — ABNORMAL LOW (ref 4.0–10.5)
nRBC: 0 % (ref 0.0–0.2)

## 2021-10-20 LAB — CMP (CANCER CENTER ONLY)
ALT: 18 U/L (ref 0–44)
AST: 14 U/L — ABNORMAL LOW (ref 15–41)
Albumin: 3.4 g/dL — ABNORMAL LOW (ref 3.5–5.0)
Alkaline Phosphatase: 79 U/L (ref 38–126)
Anion gap: 8 (ref 5–15)
BUN: 20 mg/dL (ref 8–23)
CO2: 27 mmol/L (ref 22–32)
Calcium: 9.1 mg/dL (ref 8.9–10.3)
Chloride: 105 mmol/L (ref 98–111)
Creatinine: 1.28 mg/dL — ABNORMAL HIGH (ref 0.44–1.00)
GFR, Estimated: 44 mL/min — ABNORMAL LOW (ref >60)
Glucose, Bld: 117 mg/dL — ABNORMAL HIGH (ref 70–99)
Potassium: 4.1 mmol/L (ref 3.5–5.1)
Sodium: 140 mmol/L (ref 135–145)
Total Bilirubin: 0.3 mg/dL (ref 0.3–1.2)
Total Protein: 5.6 g/dL — ABNORMAL LOW (ref 6.5–8.1)

## 2021-10-20 LAB — PREPARE RBC (CROSSMATCH)

## 2021-10-20 MED ORDER — FAMOTIDINE IN NACL 20-0.9 MG/50ML-% IV SOLN
20.0000 mg | Freq: Once | INTRAVENOUS | Status: AC
  Administered 2021-10-20: 20 mg via INTRAVENOUS
  Filled 2021-10-20: qty 50

## 2021-10-20 MED ORDER — SODIUM CHLORIDE 0.9 % IV SOLN
490.0000 mg/m2 | Freq: Once | INTRAVENOUS | Status: AC
  Administered 2021-10-20: 1000 mg via INTRAVENOUS
  Filled 2021-10-20: qty 50

## 2021-10-20 MED ORDER — ACETAMINOPHEN 325 MG PO TABS
650.0000 mg | ORAL_TABLET | Freq: Once | ORAL | Status: AC
  Administered 2021-10-20: 650 mg via ORAL
  Filled 2021-10-20: qty 2

## 2021-10-20 MED ORDER — DOXORUBICIN HCL CHEMO IV INJECTION 2 MG/ML
25.0000 mg/m2 | Freq: Once | INTRAVENOUS | Status: AC
  Administered 2021-10-20: 52 mg via INTRAVENOUS
  Filled 2021-10-20: qty 26

## 2021-10-20 MED ORDER — SODIUM CHLORIDE 0.9 % IV SOLN
375.0000 mg/m2 | Freq: Once | INTRAVENOUS | Status: AC
  Administered 2021-10-20: 800 mg via INTRAVENOUS
  Filled 2021-10-20: qty 50

## 2021-10-20 MED ORDER — HEPARIN SOD (PORK) LOCK FLUSH 100 UNIT/ML IV SOLN
500.0000 [IU] | Freq: Once | INTRAVENOUS | Status: AC | PRN
  Administered 2021-10-20: 500 [IU]

## 2021-10-20 MED ORDER — SODIUM CHLORIDE 0.9 % IV SOLN
10.0000 mg | Freq: Once | INTRAVENOUS | Status: AC
  Administered 2021-10-20: 10 mg via INTRAVENOUS
  Filled 2021-10-20: qty 1

## 2021-10-20 MED ORDER — DIPHENHYDRAMINE HCL 25 MG PO CAPS
50.0000 mg | ORAL_CAPSULE | Freq: Once | ORAL | Status: AC
  Administered 2021-10-20: 50 mg via ORAL
  Filled 2021-10-20: qty 2

## 2021-10-20 MED ORDER — PALONOSETRON HCL INJECTION 0.25 MG/5ML
0.2500 mg | Freq: Once | INTRAVENOUS | Status: AC
  Administered 2021-10-20: 0.25 mg via INTRAVENOUS
  Filled 2021-10-20: qty 5

## 2021-10-20 MED ORDER — VINCRISTINE SULFATE CHEMO INJECTION 1 MG/ML
1.0000 mg | Freq: Once | INTRAVENOUS | Status: AC
  Administered 2021-10-20: 1 mg via INTRAVENOUS
  Filled 2021-10-20: qty 1

## 2021-10-20 MED ORDER — SODIUM CHLORIDE 0.9 % IV SOLN
150.0000 mg | Freq: Once | INTRAVENOUS | Status: AC
  Administered 2021-10-20: 150 mg via INTRAVENOUS
  Filled 2021-10-20: qty 150

## 2021-10-20 MED ORDER — SODIUM CHLORIDE 0.9 % IV SOLN: Freq: Once | INTRAVENOUS | Status: AC

## 2021-10-20 MED ORDER — SODIUM CHLORIDE 0.9% FLUSH
10.0000 mL | INTRAVENOUS | Status: DC | PRN
  Administered 2021-10-20: 10 mL

## 2021-10-20 MED ORDER — LIDOCAINE-PRILOCAINE 2.5-2.5 % EX CREA: TOPICAL_CREAM | CUTANEOUS | 3 refills | Status: DC

## 2021-10-20 NOTE — Patient Instructions (Addendum)
[facility]  Discharge Instructions: Thank you for choosing [facility] to provide your oncology and hematology care.   If you have a lab appointment with the [facility], please go directly to the [facility] and check in at the registration area.   Wear comfortable clothing and clothing appropriate for easy access to any Portacath or PICC line.   We strive to give you quality time with your provider. You may need to reschedule your appointment if you arrive late (15 or more minutes).  Arriving late affects you and other patients whose appointments are after yours.  Also, if you miss three or more appointments without notifying the office, you may be dismissed from the clinic at the provider's discretion.      For prescription refill requests, have your pharmacy contact our office and allow 72 hours for refills to be completed.    Today you received the following chemotherapy and/or immunotherapy agents: Adriamycin vincristine, cyclophosphamide, rituximab      To help prevent nausea and vomiting after your treatment, we encourage you to take your nausea medication as directed.  BELOW ARE SYMPTOMS THAT SHOULD BE REPORTED IMMEDIATELY: *FEVER GREATER THAN 100.4 F (38 C) OR HIGHER *CHILLS OR SWEATING *NAUSEA AND VOMITING THAT IS NOT CONTROLLED WITH YOUR NAUSEA MEDICATION *UNUSUAL SHORTNESS OF BREATH *UNUSUAL BRUISING OR BLEEDING *URINARY PROBLEMS (pain or burning when urinating, or frequent urination) *BOWEL PROBLEMS (unusual diarrhea, constipation, pain near the anus) TENDERNESS IN MOUTH AND THROAT WITH OR WITHOUT PRESENCE OF ULCERS (sore throat, sores in mouth, or a toothache) UNUSUAL RASH, SWELLING OR PAIN  UNUSUAL VAGINAL DISCHARGE OR ITCHING   Items with * indicate a potential emergency and should be followed up as soon as possible or go to the Emergency Department if any problems should occur.  Please show the CHEMOTHERAPY ALERT CARD or  IMMUNOTHERAPY ALERT CARD at check-in to the Emergency Department and triage nurse.  Should you have questions after your visit or need to cancel or reschedule your appointment, please contact [facility]  Dept: [phone]  and follow the prompts.  Office hours are 8:00 a.m. to 4:30 p.m. Monday - Friday. Please note that voicemails left after 4:00 p.m. may not be returned until the following business day.  We are closed weekends and major holidays. You have access to a nurse at all times for urgent questions. Please call the main number to the clinic Dept: [phone] and follow the prompts.   For any non-urgent questions, you may also contact your provider using MyChart. We now offer e-Visits for anyone 16 and older to request care online for non-urgent symptoms. For details visit [email or web address].   Also download the MyChart app! Go to the app store, search "MyChart", open the app, select [facility], and log in with your MyChart username and password.  Masks are optional in the cancer centers. If you would like for your care team to wear a mask while they are taking care of you, please let them know. You may have one support person who is at least 75 years old accompany you for your appointments.  Doxorubicin Injection What is this medication? DOXORUBICIN (dox oh ROO bi sin) treats some types of cancer. It works by slowing down the growth of cancer cells. This medicine may be used for other purposes; ask your health care provider or pharmacist if you have questions. COMMON BRAND NAME(S): Adriamycin, Adriamycin PFS, Adriamycin RDF, Rubex What should I  tell my care team before I take this medication? They need to know if you have any of these conditions: Heart disease History of low blood cell levels caused by a medication Liver disease Recent or ongoing radiation An unusual or allergic reaction to doxorubicin, other medications, foods, dyes, or  preservatives If you or your partner are pregnant or trying to get pregnant Breast-feeding How should I use this medication? This medication is injected into a vein. It is given by your care team in a hospital or clinic setting. Talk to your care team about the use of this medication in children. Special care may be needed. Overdosage: If you think you have taken too much of this medicine contact a poison control center or emergency room at once. NOTE: This medicine is only for you. Do not share this medicine with others. What if I miss a dose? Keep appointments for follow-up doses. It is important not to miss your dose. Call your care team if you are unable to keep an appointment. What may interact with this medication? 6-mercaptopurine Paclitaxel Phenytoin St. John's wort Trastuzumab Verapamil This list may not describe all possible interactions. Give your health care provider a list of all the medicines, herbs, non-prescription drugs, or dietary supplements you use. Also tell them if you smoke, drink alcohol, or use illegal drugs. Some items may interact with your medicine. What should I watch for while using this medication? Your condition will be monitored carefully while you are receiving this medication. You may need blood work while taking this medication. This medication may make you feel generally unwell. This is not uncommon as chemotherapy can affect healthy cells as well as cancer cells. Report any side effects. Continue your course of treatment even though you feel ill unless your care team tells you to stop. There is a maximum amount of this medication you should receive throughout your life. The amount depends on the medical condition being treated and your overall health. Your care team will watch how much of this medication you receive. Tell your care team if you have taken this medication before. Your urine may turn red for a few days after your dose. This is not blood. If  your urine is dark or brown, call your care team. In some cases, you may be given additional medications to help with side effects. Follow all directions for their use. This medication may increase your risk of getting an infection. Call your care team for advice if you get a fever, chills, sore throat, or other symptoms of a cold or flu. Do not treat yourself. Try to avoid being around people who are sick. This medication may increase your risk to bruise or bleed. Call your care team if you notice any unusual bleeding. Talk to your care team about your risk of cancer. You may be more at risk for certain types of cancers if you take this medication. You should make sure that you get enough Coenzyme Q10 while you are taking this medication. Discuss the foods you eat and the vitamins you take with your care team. Talk to your care team if you or your partner may be pregnant. Serious birth defects can occur if you take this medication during pregnancy and for 6 months after the last dose. Contraception is recommended while taking this medication and for 6 months after the last dose. Your care team can help you find the option that works for you. If your partner can get pregnant, use a condom  while taking this medication and for 6 months after the last dose. Do not breastfeed while taking this medication. This medication may cause infertility. Talk to your care team if you are concerned about your fertility. What side effects may I notice from receiving this medication? Side effects that you should report to your care team as soon as possible: Allergic reactions--skin rash, itching, hives, swelling of the face, lips, tongue, or throat Heart failure--shortness of breath, swelling of the ankles, feet, or hands, sudden weight gain, unusual weakness or fatigue Heart rhythm changes--fast or irregular heartbeat, dizziness, feeling faint or lightheaded, chest pain, trouble breathing Infection--fever, chills,  cough, sore throat, wounds that don't heal, pain or trouble when passing urine, general feeling of discomfort or being unwell Low red blood cell level--unusual weakness or fatigue, dizziness, headache, trouble breathing Painful swelling, warmth, or redness of the skin, blisters or sores at the infusion site Unusual bruising or bleeding Side effects that usually do not require medical attention (report to your care team if they continue or are bothersome): Diarrhea Hair loss Nausea Pain, redness, or swelling with sores inside the mouth or throat Red urine This list may not describe all possible side effects. Call your doctor for medical advice about side effects. You may report side effects to FDA at 1-800-FDA-1088. Where should I keep my medication? This medication is given in a hospital or clinic. It will not be stored at home. NOTE: This sheet is a summary. It may not cover all possible information. If you have questions about this medicine, talk to your doctor, pharmacist, or health care provider.  2023 Elsevier/Gold Standard (2021-05-07 00:00:00)  Vincristine Injection What is this medication? VINCRISTINE (vin KRIS teen) treats some types of cancer. It works by slowing down the growth of cancer cells. This medicine may be used for other purposes; ask your health care provider or pharmacist if you have questions. COMMON BRAND NAME(S): Oncovin, Vincasar PFS What should I tell my care team before I take this medication? They need to know if you have any of these conditions: Infection Kidney disease Liver disease Low white blood cell levels Lung disease Nervous system disease, such as Charcot-Marie-Tooth (CMT) Recent or ongoing radiation therapy An unusual or allergic reaction to vincristine, other chemotherapy agents, other medications, foods, dyes, or preservatives Pregnant or trying to get pregnant Breast-feeding How should I use this medication? This medication is infused into  a vein. It is given by your care team in a hospital or clinic setting. Talk to your care team about the use of this medication in children. While it may be given to children for selected conditions, precautions do apply. Overdosage: If you think you have taken too much of this medicine contact a poison control center or emergency room at once. NOTE: This medicine is only for you. Do not share this medicine with others. What if I miss a dose? Keep appointments for follow-up doses. It is important not to miss your dose. Call your care team if you are unable to keep an appointment. What may interact with this medication? Do not take this medication with any of the following: Live virus vaccines This medication may also interact with the following: Medications for fungal infections, such as itraconazole or fluconazole Phenytoin Supplements, such as St. John's wort This list may not describe all possible interactions. Give your health care provider a list of all the medicines, herbs, non-prescription drugs, or dietary supplements you use. Also tell them if you smoke, drink alcohol,  or use illegal drugs. Some items may interact with your medicine. What should I watch for while using this medication? Your condition will be monitored carefully while you are receiving this medication. This medication may make you feel generally unwell. This is not uncommon as chemotherapy can affect healthy cells as well as cancer cells. Report any side effects. Continue your course of treatment even though you feel ill unless your care team tells you to stop. You may need blood work while taking this medication. This medication may increase your risk to bruise or bleed. Call your care team if you notice any unusual bleeding. This medication may increase your risk of getting an infection. Call your care team for advice if you get a fever, chills, sore throat, or other symptoms of a cold or flu. Do not treat yourself. Try  to avoid being around people who are sick. This medication will cause constipation. If you do not have a bowel movement for 3 days, call your care team. Call your care team if you are around anyone with measles, chickenpox, or if you develop sores or blisters that do not heal properly. Be careful brushing or flossing your teeth or using a toothpick because you may get an infection or bleed more easily. If you have any dental work done, tell your dentist you are receiving this medication Talk to your care team if you or your partner wish to become pregnant or think either of you might be pregnant. This medication can cause serious birth defects. This medication may cause infertility. Talk to your care team if you are concerned about your fertility. Talk to your care team before breastfeeding. Changes to your treatment plan may be needed. What side effects may I notice from receiving this medication? Side effects that you should report to your care team as soon as possible: Allergic reactions--skin rash, itching, hives, swelling of the face, lips, tongue, or throat High uric acid level--severe pain, redness, warmth, or swelling in joints, pain or trouble passing urine, pain in the lower back or sides Infection--fever, chills, cough, sore throat, wounds that don't heal, pain or trouble when passing urine, general feeling of discomfort or being unwell Pain, tingling, or numbness in the hands or feet, muscle weakness, change in vision, confusion or trouble speaking, loss of balance or coordination, trouble walking, seizures Painful swelling, warmth, or redness of the skin, blisters or sores at the infusion site Shortness of breath or trouble breathing Side effects that usually do not require medical attention (report to your care team if they continue or are bothersome): Constipation Diarrhea Hair loss Loss of appetite Nausea Stomach cramping Vomiting This list may not describe all possible side  effects. Call your doctor for medical advice about side effects. You may report side effects to FDA at 1-800-FDA-1088. Where should I keep my medication? This medication is given in a hospital or clinic. It will not be stored at home. NOTE: This sheet is a summary. It may not cover all possible information. If you have questions about this medicine, talk to your doctor, pharmacist, or health care provider.  2023 Elsevier/Gold Standard (2007-02-19 00:00:00)  Cyclophosphamide Injection What is this medication? CYCLOPHOSPHAMIDE (sye kloe FOSS fa mide) treats some types of cancer. It works by slowing down the growth of cancer cells. This medicine may be used for other purposes; ask your health care provider or pharmacist if you have questions. COMMON BRAND NAME(S): Cyclophosphamide, Cytoxan, Neosar What should I tell my care team before I  take this medication? They need to know if you have any of these conditions: Heart disease Irregular heartbeat or rhythm Infection Kidney problems Liver disease Low blood cell levels (white cells, platelets, or red blood cells) Lung disease Previous radiation Trouble passing urine An unusual or allergic reaction to cyclophosphamide, other medications, foods, dyes, or preservatives Pregnant or trying to get pregnant Breast-feeding How should I use this medication? This medication is injected into a vein. It is given by your care team in a hospital or clinic setting. Talk to your care team about the use of this medication in children. Special care may be needed. Overdosage: If you think you have taken too much of this medicine contact a poison control center or emergency room at once. NOTE: This medicine is only for you. Do not share this medicine with others. What if I miss a dose? Keep appointments for follow-up doses. It is important not to miss your dose. Call your care team if you are unable to keep an appointment. What may interact with this  medication? Amphotericin B Amiodarone Azathioprine Certain antivirals for HIV or hepatitis Certain medications for blood pressure, such as enalapril, lisinopril, quinapril Cyclosporine Diuretics Etanercept Indomethacin Medications that relax muscles Metronidazole Natalizumab Tamoxifen Warfarin This list may not describe all possible interactions. Give your health care provider a list of all the medicines, herbs, non-prescription drugs, or dietary supplements you use. Also tell them if you smoke, drink alcohol, or use illegal drugs. Some items may interact with your medicine. What should I watch for while using this medication? This medication may make you feel generally unwell. This is not uncommon as chemotherapy can affect healthy cells as well as cancer cells. Report any side effects. Continue your course of treatment even though you feel ill unless your care team tells you to stop. You may need blood work while you are taking this medication. This medication may increase your risk of getting an infection. Call your care team for advice if you get a fever, chills, sore throat, or other symptoms of a cold or flu. Do not treat yourself. Try to avoid being around people who are sick. Avoid taking medications that contain aspirin, acetaminophen, ibuprofen, naproxen, or ketoprofen unless instructed by your care team. These medications may hide a fever. Be careful brushing or flossing your teeth or using a toothpick because you may get an infection or bleed more easily. If you have any dental work done, tell your dentist you are receiving this medication. Drink water or other fluids as directed. Urinate often, even at night. Some products may contain alcohol. Ask your care team if this medication contains alcohol. Be sure to tell all care teams you are taking this medicine. Certain medicines, like metronidazole and disulfiram, can cause an unpleasant reaction when taken with alcohol. The reaction  includes flushing, headache, nausea, vomiting, sweating, and increased thirst. The reaction can last from 30 minutes to several hours. Talk to your care team if you wish to become pregnant or think you might be pregnant. This medication can cause serious birth defects if taken during pregnancy and for 1 year after the last dose. A negative pregnancy test is required before starting this medication. A reliable form of contraception is recommended while taking this medication and for 1 year after the last dose. Talk to your care team about reliable forms of contraception. Do not father a child while taking this medication and for 4 months after the last dose. Use a condom during this  time period. Do not breast-feed while taking this medication or for 1 week after the last dose. This medication may cause infertility. Talk to your care team if you are concerned about your fertility. Talk to your care team about your risk of cancer. You may be more at risk for certain types of cancer if you take this medication. What side effects may I notice from receiving this medication? Side effects that you should report to your care team as soon as possible: Allergic reactions--skin rash, itching, hives, swelling of the face, lips, tongue, or throat Dry cough, shortness of breath or trouble breathing Heart failure--shortness of breath, swelling of the ankles, feet, or hands, sudden weight gain, unusual weakness or fatigue Heart muscle inflammation--unusual weakness or fatigue, shortness of breath, chest pain, fast or irregular heartbeat, dizziness, swelling of the ankles, feet, or hands Heart rhythm changes--fast or irregular heartbeat, dizziness, feeling faint or lightheaded, chest pain, trouble breathing Infection--fever, chills, cough, sore throat, wounds that don't heal, pain or trouble when passing urine, general feeling of discomfort or being unwell Kidney injury--decrease in the amount of urine, swelling of the  ankles, hands, or feet Liver injury--right upper belly pain, loss of appetite, nausea, light-colored stool, dark yellow or brown urine, yellowing skin or eyes, unusual weakness or fatigue Low red blood cell level--unusual weakness or fatigue, dizziness, headache, trouble breathing Low sodium level--muscle weakness, fatigue, dizziness, headache, confusion Red or dark brown urine Unusual bruising or bleeding Side effects that usually do not require medical attention (report to your care team if they continue or are bothersome): Hair loss Irregular menstrual cycles or spotting Loss of appetite Nausea Pain, redness, or swelling with sores inside the mouth or throat Vomiting This list may not describe all possible side effects. Call your doctor for medical advice about side effects. You may report side effects to FDA at 1-800-FDA-1088. Where should I keep my medication? This medication is given in a hospital or clinic. It will not be stored at home. NOTE: This sheet is a summary. It may not cover all possible information. If you have questions about this medicine, talk to your doctor, pharmacist, or health care provider.  2023 Elsevier/Gold Standard (2021-04-09 00:00:00)   Rituximab Injection What is this medication? RITUXIMAB (ri TUX i mab) treats leukemia and lymphoma. It works by blocking a protein that causes cancer cells to grow and multiply. This helps to slow or stop the spread of cancer cells. It may also be used to treat autoimmune conditions, such as arthritis. It works by slowing down an overactive immune system. It is a monoclonal antibody. This medicine may be used for other purposes; ask your health care provider or pharmacist if you have questions. COMMON BRAND NAME(S): RIABNI, Rituxan, RUXIENCE, truxima What should I tell my care team before I take this medication? They need to know if you have any of these conditions: Chest pain Heart disease Immune system  problems Infection, such as chickenpox, cold sores, hepatitis B, herpes Irregular heartbeat or rhythm Kidney disease Low blood counts, such as low white cells, platelets, red cells Lung disease Recent or upcoming vaccine An unusual or allergic reaction to rituximab, other medications, foods, dyes, or preservatives Pregnant or trying to get pregnant Breast-feeding How should I use this medication? This medication is injected into a vein. It is given by a care team in a hospital or clinic setting. A special MedGuide will be given to you before each treatment. Be sure to read this information carefully each  time. Talk to your care team about the use of this medication in children. While this medication may be prescribed for children as young as 6 months for selected conditions, precautions do apply. Overdosage: If you think you have taken too much of this medicine contact a poison control center or emergency room at once. NOTE: This medicine is only for you. Do not share this medicine with others. What if I miss a dose? Keep appointments for follow-up doses. It is important not to miss your dose. Call your care team if you are unable to keep an appointment. What may interact with this medication? Do not take this medication with any of the following: Live vaccines This medication may also interact with the following: Cisplatin This list may not describe all possible interactions. Give your health care provider a list of all the medicines, herbs, non-prescription drugs, or dietary supplements you use. Also tell them if you smoke, drink alcohol, or use illegal drugs. Some items may interact with your medicine. What should I watch for while using this medication? Your condition will be monitored carefully while you are receiving this medication. You may need blood work while taking this medication. This medication can cause serious infusion reactions. To reduce the risk your care team may give  you other medications to take before receiving this one. Be sure to follow the directions from your care team. This medication may increase your risk of getting an infection. Call your care team for advice if you get a fever, chills, sore throat, or other symptoms of a cold or flu. Do not treat yourself. Try to avoid being around people who are sick. Call your care team if you are around anyone with measles, chickenpox, or if you develop sores or blisters that do not heal properly. Avoid taking medications that contain aspirin, acetaminophen, ibuprofen, naproxen, or ketoprofen unless instructed by your care team. These medications may hide a fever. This medication may cause serious skin reactions. They can happen weeks to months after starting the medication. Contact your care team right away if you notice fevers or flu-like symptoms with a rash. The rash may be red or purple and then turn into blisters or peeling of the skin. You may also notice a red rash with swelling of the face, lips, or lymph nodes in your neck or under your arms. In some patients, this medication may cause a serious brain infection that may cause death. If you have any problems seeing, thinking, speaking, walking, or standing, tell your care team right away. If you cannot reach your care team, urgently seek another source of medical care. Talk to your care team if you may be pregnant. Serious birth defects can occur if you take this medication during pregnancy and for 12 months after the last dose. You will need a negative pregnancy test before starting this medication. Contraception is recommended while taking this medication and for 12 months after the last dose. Your care team can help you find the option that works for you. Do not breastfeed while taking this medication and for at least 6 months after the last dose. What side effects may I notice from receiving this medication? Side effects that you should report to your care  team as soon as possible: Allergic reactions or angioedema--skin rash, itching or hives, swelling of the face, eyes, lips, tongue, arms, or legs, trouble swallowing or breathing Bowel blockage--stomach cramping, unable to have a bowel movement or pass gas, loss of appetite, vomiting Dizziness,  loss of balance or coordination, confusion or trouble speaking Heart attack--pain or tightness in the chest, shoulders, arms, or jaw, nausea, shortness of breath, cold or clammy skin, feeling faint or lightheaded Heart rhythm changes--fast or irregular heartbeat, dizziness, feeling faint or lightheaded, chest pain, trouble breathing Infection--fever, chills, cough, sore throat, wounds that don't heal, pain or trouble when passing urine, general feeling of discomfort or being unwell Infusion reactions--chest pain, shortness of breath or trouble breathing, feeling faint or lightheaded Kidney injury--decrease in the amount of urine, swelling of the ankles, hands, or feet Liver injury--right upper belly pain, loss of appetite, nausea, light-colored stool, dark yellow or brown urine, yellowing skin or eyes, unusual weakness or fatigue Redness, blistering, peeling, or loosening of the skin, including inside the mouth Stomach pain that is severe, does not go away, or gets worse Tumor lysis syndrome (TLS)--nausea, vomiting, diarrhea, decrease in the amount of urine, dark urine, unusual weakness or fatigue, confusion, muscle pain or cramps, fast or irregular heartbeat, joint pain Side effects that usually do not require medical attention (report to your care team if they continue or are bothersome): Headache Joint pain Nausea Runny or stuffy nose Unusual weakness or fatigue This list may not describe all possible side effects. Call your doctor for medical advice about side effects. You may report side effects to FDA at 1-800-FDA-1088. Where should I keep my medication? This medication is given in a hospital or  clinic. It will not be stored at home. NOTE: This sheet is a summary. It may not cover all possible information. If you have questions about this medicine, talk to your doctor, pharmacist, or health care provider.  2023 Elsevier/Gold Standard (2021-05-19 00:00:00)

## 2021-10-20 NOTE — Progress Notes (Signed)
[name] OFFICE PROGRESS NOTE   Diagnosis: Non-Hodgkin's lymphoma  INTERVAL HISTORY:   [name] [name] returns as scheduled.  She completed cycle 2 CHOP/Rituxan beginning 09/29/2021.  She denies nausea/vomiting.  No mouth sores following chemotherapy.  Mouth intermittently feels sore.  She became constipated after the chemotherapy.  No numbness or tingling in the hands or feet.  Energy level varies.  She has a good appetite.  She has dyspnea on exertion.  Objective:  Vital signs in last 24 hours:  Blood pressure (!) 127/58, pulse 78, temperature 98.1 F (36.7 C), temperature source Oral, resp. rate 18, height $RemoveBe'5\' 7"'DCuVIPlZP$  (1.702 m), weight 197 lb (89.4 kg), SpO2 100 %.    HEENT: No thrush or ulcers. Lymphatics: Smooth round masslike fullness left inguinal region. Resp: Lungs clear bilaterally. Cardio: Regular rate and rhythm. GI: No hepatosplenomegaly. Vascular: Pitting edema lower leg bilaterally. Neuro: Alert and oriented. Skin: Dry skin rash scattered over the back. Port-A-Cath without erythema.  Lab Results:  Lab Results  Component Value Date   WBC 3.2 (L) 10/20/2021   HGB 7.6 (L) 10/20/2021   HCT 25.4 (L) 10/20/2021   MCV 105.4 (H) 10/20/2021   PLT 204 10/20/2021   NEUTROABS 2.0 10/20/2021    Imaging:  No results found.  Medications: I have reviewed the patient's current medications.  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  Negative myeloma FISH panel, 46XX; neotype myeloid disorders profile NGS sequencing-no pathogenic mutations detected in any of the genes on the NGS panel -PNH screen - 08/26/2020 -CTs 02/17/2021-new splenomegaly;  diffuse bilateral bronchial wall thickening; clustered groundglass and fine nodular opacity in the dependent right lower lobe.  CAD. -Bone marrow biopsy 08/13/2021-hypercellular bone marrow for age with trilineage hematopoiesis; several atypical lymphoid aggregates present; no increase in blastic cells; flow cytometry without significant T or B-cell abnormalities; normal cytogenetics; "the limited morphologic and immunohistochemical features are atypical and a lymphoproliferative process is not excluded" -PET scan 3/71/6967-ELFYBOFBPZ hypermetabolic lymphadenopathy in the neck, left axilla, mediastinum, retroperitoneum, and left pelvis.  Hypermetabolic skeletal lesions, generalized bilateral pulmonary hypermetabolism -08/30/2021-1 unit RBCs -Excisional lymph node biopsy left groin 08/30/2021-Large B-cell lymphoma, CD20 positive, CD30 positive; FISH analysis-no evidence of a double/triple hit lymphoma -Cycle 1 R-CHOP 09/05/2021, Granix 09/06/2021 for 7 days -Cycle 2 R-CHOP 09/29/2021, Udenyca -Cycle 3 R-CHOP 10/20/2021, Udenyca Fatigue Dyspnea on exertion Unsteady gait/balance disorder, followed by Dr. Tomi Likens CKD Atrial fibrillation Hypertension Sleep apnea Anorexia/weight loss 06/13/2021 skin rash, question vasculitis-course of prednisone initiated; 06/20/2021 marked improvement, rash recurred when she was tapered off of prednisone, prednisone resumed by dermatology History of coagulopathy-most likely related to malnutrition and apixaban Port-A-Cath placement 09/09/2021    Disposition: [name] appears stable.  Performance status continues to be improved.  She has now completed 2 cycles of CHOP/Rituxan.  Plan to proceed with cycle 3 today as scheduled.  CBC and chemistry panel reviewed.  Labs adequate to proceed with treatment.  She has persistent severe anemia and is symptomatic.  We are making arrangements for a blood transfusion.  The firm fullness in the left groin is likely a seroma.  She will  return for lab, follow-up, cycle 4 CHOP/Rituxan in 3 weeks.  We are available to see her sooner if needed.  Patient seen with Dr. Benay Spice.    Lattie Haw  Marcello Moores ANP/GNP-BC   10/20/2021  9:01 AM  This was a shared visit with Ned Card.  [name] was interviewed and examined.  She has completed 2 cycles of CHOP/rituximab.  She has tolerated the treatment well.  Her performance status is much improved.  The nodular lesion at the left groin is likely a postoperative seroma.  She will complete another cycle of CHOP/rituximab today. I was present for greater than 50% today's visit.  I performed medical stage making her  [name], MD

## 2021-10-20 NOTE — Progress Notes (Signed)
Patient seen by Ned Card NP today  Vitals are within treatment parameters.  Labs reviewed by Ned Card NP and are not all within treatment parameters. Patient hemoglobin is 7.6. She will be received 2 unit of RBCS the follow day.  Per physician team, patient is ready for treatment and there are NO modifications to the treatment plan.

## 2021-10-21 ENCOUNTER — Inpatient Hospital Stay: Payer: Medicare HMO

## 2021-10-21 ENCOUNTER — Other Ambulatory Visit: Payer: Self-pay

## 2021-10-21 VITALS — BP 140/77 | HR 60 | Temp 96.6°F | Resp 18

## 2021-10-21 DIAGNOSIS — K1379 Other lesions of oral mucosa: Secondary | ICD-10-CM | POA: Diagnosis not present

## 2021-10-21 DIAGNOSIS — D696 Thrombocytopenia, unspecified: Secondary | ICD-10-CM | POA: Diagnosis not present

## 2021-10-21 DIAGNOSIS — C8598 Non-Hodgkin lymphoma, unspecified, lymph nodes of multiple sites: Secondary | ICD-10-CM

## 2021-10-21 DIAGNOSIS — C8330 Diffuse large B-cell lymphoma, unspecified site: Secondary | ICD-10-CM | POA: Diagnosis not present

## 2021-10-21 DIAGNOSIS — K5900 Constipation, unspecified: Secondary | ICD-10-CM | POA: Diagnosis not present

## 2021-10-21 DIAGNOSIS — N1832 Chronic kidney disease, stage 3b: Secondary | ICD-10-CM | POA: Diagnosis not present

## 2021-10-21 DIAGNOSIS — D539 Nutritional anemia, unspecified: Secondary | ICD-10-CM | POA: Diagnosis not present

## 2021-10-21 DIAGNOSIS — Z5112 Encounter for antineoplastic immunotherapy: Secondary | ICD-10-CM | POA: Diagnosis not present

## 2021-10-21 DIAGNOSIS — Z5111 Encounter for antineoplastic chemotherapy: Secondary | ICD-10-CM | POA: Diagnosis not present

## 2021-10-21 DIAGNOSIS — R0609 Other forms of dyspnea: Secondary | ICD-10-CM | POA: Diagnosis not present

## 2021-10-21 DIAGNOSIS — Z5189 Encounter for other specified aftercare: Secondary | ICD-10-CM | POA: Diagnosis not present

## 2021-10-21 MED ORDER — SODIUM CHLORIDE 0.9% IV SOLUTION
250.0000 mL | Freq: Once | INTRAVENOUS | Status: AC
  Administered 2021-10-21: 250 mL via INTRAVENOUS

## 2021-10-21 MED ORDER — SODIUM CHLORIDE 0.9% FLUSH
10.0000 mL | INTRAVENOUS | Status: AC | PRN
  Administered 2021-10-21: 10 mL

## 2021-10-21 MED ORDER — PEGFILGRASTIM-CBQV 6 MG/0.6ML ~~LOC~~ SOSY
6.0000 mg | PREFILLED_SYRINGE | Freq: Once | SUBCUTANEOUS | Status: AC
  Administered 2021-10-21: 6 mg via SUBCUTANEOUS
  Filled 2021-10-21: qty 0.6

## 2021-10-21 MED ORDER — HEPARIN SOD (PORK) LOCK FLUSH 100 UNIT/ML IV SOLN
500.0000 [IU] | Freq: Every day | INTRAVENOUS | Status: AC | PRN
  Administered 2021-10-21: 500 [IU]

## 2021-10-21 NOTE — Patient Instructions (Signed)
Blood Transfusion, Adult, Care After After a blood transfusion, it is common to have: Bruising and soreness at the IV site. A headache. Follow these instructions at home: Your doctor may give you more instructions. If you have problems, contact your doctor. Insertion site care     Follow instructions from your doctor about how to take care of your insertion site. This is where an IV tube was put into your vein. Make sure you: Wash your hands with soap and water for at least 20 seconds before and after you change your bandage. If you cannot use soap and water, use hand sanitizer. Change your bandage as told by your doctor. Check your insertion site every day for signs of infection. Check for: Redness, swelling, or pain. Bleeding from the site. Warmth. Pus or a bad smell. General instructions Take over-the-counter and prescription medicines only as told by your doctor. Rest as told by your doctor. Go back to your normal activities as told by your doctor. Keep all follow-up visits. You may need to have tests at certain times to check your blood. Contact a doctor if: You have itching or red, swollen areas of skin (hives). You have a fever or chills. You have pain in the head, back, or chest. You feel worried or nervous (anxious). You feel weak after doing your normal activities. You have any of these problems at the insertion site: Redness, swelling, warmth, or pain. Bleeding that does not stop with pressure. Pus or a bad smell. If you received your blood transfusion in an outpatient setting, you will be told whom to contact to report any reactions. Get help right away if: You have signs of a serious reaction. This may be coming from an allergy or the body's defense system (immune system). Signs include: Trouble breathing or shortness of breath. Swelling of the face or feeling warm (flushed). A widespread rash. Dark pee (urine) or blood in the pee. Fast heartbeat. These symptoms  may be an emergency. Get help right away. Call 911. Do not wait to see if the symptoms will go away. Do not drive yourself to the hospital. Summary Bruising and soreness at the IV site are common. Check your insertion site every day for signs of infection. Rest as told by your doctor. Go back to your normal activities as told by your doctor. Get help right away if you have signs of a serious reaction. This information is not intended to replace advice given to you by your health care provider. Make sure you discuss any questions you have with your health care provider. Document Revised: 03/28/2021 Document Reviewed: 03/28/2021 Elsevier Patient Education  [place].  Pegfilgrastim Injection What is this medication? PEGFILGRASTIM (PEG fil gra stim) lowers the risk of infection in people who are receiving chemotherapy. It works by Building control surveyor make more white blood cells, which protects your body from infection. It may also be used to help people who have been exposed to high doses of radiation. This medicine may be used for other purposes; ask your health care provider or pharmacist if you have questions. COMMON BRAND NAME(S): [place] [employer], Neulasta, Nyvepria, Stimufend, UDENYCA, Ziextenzo What should I tell my care team before I take this medication? They need to know if you have any of these conditions: Kidney disease Latex allergy Ongoing radiation therapy Sickle cell disease Skin reactions to acrylic adhesives (On-Body Injector only) An unusual or allergic reaction to pegfilgrastim, filgrastim, other medications, foods, dyes, or preservatives Pregnant or trying to  get pregnant Breast-feeding How should I use this medication? This medication is for injection under the skin. If you get this medication at home, you will be taught how to prepare and give the pre-filled syringe or how to use the On-body Injector. Refer to the patient Instructions for Use for detailed  instructions. Use exactly as directed. Tell your care team immediately if you suspect that the On-body Injector may not have performed as intended or if you suspect the use of the On-body Injector resulted in a missed or partial dose. It is important that you put your used needles and syringes in a special sharps container. Do not put them in a trash can. If you do not have a sharps container, call your pharmacist or care team to get one. Talk to your care team about the use of this medication in children. While this medication may be prescribed for selected conditions, precautions do apply. Overdosage: If you think you have taken too much of this medicine contact a poison control center or emergency room at once. NOTE: This medicine is only for you. Do not share this medicine with others. What if I miss a dose? It is important not to miss your dose. Call your care team if you miss your dose. If you miss a dose due to an On-body Injector failure or leakage, a new dose should be administered as soon as possible using a single prefilled syringe for manual use. What may interact with this medication? Interactions have not been studied. This list may not describe all possible interactions. Give your health care provider a list of all the medicines, herbs, non-prescription drugs, or dietary supplements you use. Also tell them if you smoke, drink alcohol, or use illegal drugs. Some items may interact with your medicine. What should I watch for while using this medication? Your condition will be monitored carefully while you are receiving this medication. You may need blood work done while you are taking this medication. Talk to your care team about your risk of cancer. You may be more at risk for certain types of cancer if you take this medication. If you are going to need a MRI, CT scan, or other procedure, tell your care team that you are using this medication (On-Body Injector only). What side effects  may I notice from receiving this medication? Side effects that you should report to your care team as soon as possible: Allergic reactions--skin rash, itching, hives, swelling of the face, lips, tongue, or throat Capillary leak syndrome--stomach or muscle pain, unusual weakness or fatigue, feeling faint or lightheaded, decrease in the amount of urine, swelling of the ankles, hands, or feet, trouble breathing High white blood cell level--fever, fatigue, trouble breathing, night sweats, change in vision, weight loss Inflammation of the aorta--fever, fatigue, back, chest, or stomach pain, severe headache Kidney injury (glomerulonephritis)--decrease in the amount of urine, red or dark brown urine, foamy or bubbly urine, swelling of the ankles, hands, or feet Shortness of breath or trouble breathing Spleen injury--pain in upper left stomach or shoulder Unusual bruising or bleeding Side effects that usually do not require medical attention (report to your care team if they continue or are bothersome): Bone pain Pain in the hands or feet This list may not describe all possible side effects. Call your doctor for medical advice about side effects. You may report side effects to FDA at 1-800-FDA-1088. Where should I keep my medication? Keep out of the reach of children. If you are using this  medication at home, you will be instructed on how to store it. Throw away any unused medication after the expiration date on the label. NOTE: This sheet is a summary. It may not cover all possible information. If you have questions about this medicine, talk to your doctor, pharmacist, or health care provider.  2023 Elsevier/Gold Standard (2013-03-31 00:00:00)

## 2021-10-22 DIAGNOSIS — N2581 Secondary hyperparathyroidism of renal origin: Secondary | ICD-10-CM | POA: Diagnosis not present

## 2021-10-22 DIAGNOSIS — D631 Anemia in chronic kidney disease: Secondary | ICD-10-CM | POA: Diagnosis not present

## 2021-10-22 DIAGNOSIS — N1832 Chronic kidney disease, stage 3b: Secondary | ICD-10-CM | POA: Diagnosis not present

## 2021-10-22 DIAGNOSIS — I129 Hypertensive chronic kidney disease with stage 1 through stage 4 chronic kidney disease, or unspecified chronic kidney disease: Secondary | ICD-10-CM | POA: Diagnosis not present

## 2021-10-22 LAB — BPAM RBC
Blood Product Expiration Date: 202311032359
Blood Product Expiration Date: 202311032359
ISSUE DATE / TIME: 202310100730
ISSUE DATE / TIME: 202310100730
Unit Type and Rh: 6200
Unit Type and Rh: 6200

## 2021-10-22 LAB — TYPE AND SCREEN
ABO/RH(D): A POS
Antibody Screen: NEGATIVE
Unit division: 0
Unit division: 0

## 2021-10-29 DIAGNOSIS — N1832 Chronic kidney disease, stage 3b: Secondary | ICD-10-CM | POA: Diagnosis not present

## 2021-11-04 DIAGNOSIS — K649 Unspecified hemorrhoids: Secondary | ICD-10-CM | POA: Insufficient documentation

## 2021-11-04 DIAGNOSIS — I4891 Unspecified atrial fibrillation: Secondary | ICD-10-CM | POA: Insufficient documentation

## 2021-11-04 DIAGNOSIS — N183 Chronic kidney disease, stage 3 unspecified: Secondary | ICD-10-CM | POA: Insufficient documentation

## 2021-11-04 DIAGNOSIS — R3915 Urgency of urination: Secondary | ICD-10-CM | POA: Insufficient documentation

## 2021-11-04 DIAGNOSIS — H269 Unspecified cataract: Secondary | ICD-10-CM | POA: Insufficient documentation

## 2021-11-04 DIAGNOSIS — F419 Anxiety disorder, unspecified: Secondary | ICD-10-CM | POA: Insufficient documentation

## 2021-11-04 DIAGNOSIS — F32A Depression, unspecified: Secondary | ICD-10-CM | POA: Insufficient documentation

## 2021-11-04 DIAGNOSIS — Z8709 Personal history of other diseases of the respiratory system: Secondary | ICD-10-CM | POA: Insufficient documentation

## 2021-11-04 DIAGNOSIS — Z8679 Personal history of other diseases of the circulatory system: Secondary | ICD-10-CM | POA: Insufficient documentation

## 2021-11-04 DIAGNOSIS — M1990 Unspecified osteoarthritis, unspecified site: Secondary | ICD-10-CM | POA: Insufficient documentation

## 2021-11-04 NOTE — Progress Notes (Unsigned)
Office Visit    Patient Name: [name] [name] Date of Encounter: 11/05/2021  PCP:  Harlan Stains, MD   [place]  Cardiologist:  Candee Furbish, MD  Advanced Practice Provider:  No care team member to display Electrophysiologist:  Vickie Epley, MD }  hpi    [name] [name] is a 76 y.o. female with past medical history significant for CKD, hypertension, hyperlipidemia, MDS, OSA, atrial fibrillation presents today for overdue follow-up visit.  [name] was seen in the atrial fibrillation clinic 04/2021.  She has been maintained on amiodarone.  This was discontinued for possible side effects.  It was felt later that symptoms were due to possible MDS and not amiodarone.  Medication was resumed/7/23.  She has had several ED visits for rapid A-fib requiring DCCV, most recently 04/20/2021.  Fatigue and lightheadedness while in A-fib.  Patient is on Eliquis for CHA2DS2-VASc score of 6.  When she was last seen in follow-up she had reported her dizziness had improved with slower heart rates.  She resumed amlodipine and decrease metoprolol back to baseline dose.  She was tachycardic when she presented for cardiac CT and procedure was canceled.  She was in rapid A-fib at the time of her appointment.  She denied symptoms of palpitation, chest pain, shortness of breath.  Today, she feels much better since getting back on her amiodarone.  She feels like she has not been in atrial fibrillation.  EKG shows normal sinus rhythm today.  She ran out of her Eliquis but to the pharmacy gave her a month supply.  We will refill this today.  She also states she has had some lower extremity edema.  We plan to increase her Lasix to 20 mg daily x3 days then she can go back to her every other day dosing.  She can also increase her potassium to daily during this time.  She does have a systolic murmur on exam today.  We will plan for echocardiogram for further evaluation of her heart  valves.  Reports no shortness of breath nor dyspnea on exertion. Reports no chest pain, pressure, or tightness. No orthopnea, PND. Reports no palpitations.    Past Medical History    Past Medical History:  Diagnosis Date   (HFpEF) heart failure with preserved ejection fraction ([place]) 07/06/2016   A-fib ([place])    paroxysmal   Acquired thrombophilia ([place]) 09/03/2021   Acute bronchitis 06/21/2020   Anemia 08/02/2020   Hgb 13.7-> 10.7 between 10/2019 and 07/2020   Anxiety    Arthritis    Benign neoplasm of colon 12/29/2011   Bilateral carpal tunnel syndrome 11/06/2015   Cataracts, bilateral    immature   Cervical spondylosis without myelopathy 11/06/2015   Chest pain 07/06/2016   Chronic cough    Chronic kidney disease, stage 3 unspecified ([place]) 03/26/2021   Chronic rhinitis 06/16/2017   CKD (chronic kidney disease) stage 3, GFR 30-59 ml/min (HCC)    Depression    Disequilibrium 08/01/2019   Drug-induced hypothyroidism 03/26/2021   Dyspnea on exertion 04/03/2016   PFT 08/02/20-mild obstruction, no resp to BD, mild reduction DLCO   Essential hypertension    Fatigue 07/06/2016   Gait instability 08/01/2019   Gastroesophageal reflux disease without esophagitis    Generalized anxiety disorder 11/25/2016   Generalized weakness 08/28/2021   Hardening of the aorta (main artery of the heart) ([place]) 03/26/2021   Hemorrhoid    History of bronchitis    couple of yrs ago  History of peristent atrial fibrillation    Hoarseness 05/12/2017   Hx of colonic polyps 05/24/2014   Hyperlipidemia    takes Pravastatin daily   Hypothyroidism 04/04/2018   Long term current use of amiodarone 10/19/2017   Loss of appetite 09/03/2021   Mitral regurgitation 02/27/2016   Moderate recurrent major depression ([place]) 09/03/2021   NHL (non-Hodgkin's lymphoma) ([place]) 09/03/2021   OSA (obstructive sleep apnea) 10/19/2017   HST 09/24/2018- AHI 30.5/ hr, desaturation to 76%, body weight 249.6 lbs Unable to tolerate CPAP, failed twice.    Other specified disorders of bone density and structure, other site 09/03/2021   PAF (paroxysmal atrial fibrillation) ([place]) 02/17/2016   Pancytopenia ([place]) 08/29/2021   Persistent atrial fibrillation ([place]) 10/31/2016   Prediabetes 09/03/2021   Primary localized osteoarthritis of left knee 09/21/2016   Primary osteoarthritis of both first carpometacarpal joints 11/06/2015   Pruritus 10/31/2016   Rectocele 09/03/2021   Rhinitis, allergic 03/19/2017   S/P lumbar laminectomy 04/05/2013   Secondary hypercoagulable state ([place]) 04/28/2021   Sensorineural hearing loss (SNHL) of both ears 08/01/2019   Severe recurrent major depression without psychotic features ([place]) 11/25/2016   Splenomegaly 08/20/2021   Syncope 09/03/2021   Upper airway cough syndrome 03/19/2017   Urinary urgency    Past Surgical History:  Procedure Laterality Date   ABDOMINAL HYSTERECTOMY  1980   partial   ABDOMINAL HYSTERECTOMY     partial   BACK SURGERY     CARDIOVERSION N/A 02/24/2016   Procedure: CARDIOVERSION;  Surgeon: Fay Records, MD;  Location: [facility];  Service: Cardiovascular;  Laterality: N/A;   CARDIOVERSION N/A 03/19/2016   Procedure: CARDIOVERSION;  Surgeon: Fay Records, MD;  Location: [facility];  Service: Cardiovascular;  Laterality: N/A;   CARDIOVERSION N/A 05/15/2021   Procedure: CARDIOVERSION;  Surgeon: Pixie Casino, MD;  Location: [facility];  Service: Cardiovascular;  Laterality: N/A;   [place]     COLONOSCOPY     COLONOSCOPY WITH PROPOFOL  12/29/2011   Procedure: COLONOSCOPY WITH PROPOFOL;  Surgeon: Lear Ng, MD;  Location: [facility];  Service: Endoscopy;  Laterality: N/A;   COLONOSCOPY WITH PROPOFOL N/A 05/24/2014   Procedure: COLONOSCOPY WITH PROPOFOL;  Surgeon: Wilford Corner, MD;  Location: [facility];  Service: Endoscopy;  Laterality: N/A;   ESOPHAGEAL MANOMETRY N/A 02/22/2017   Procedure: ESOPHAGEAL MANOMETRY (EM);   Surgeon: Wilford Corner, MD;  Location[facility];  Service: Endoscopy;  Laterality: N/A;   ESOPHAGOGASTRODUODENOSCOPY     FOOT SURGERY     left, right   FRACTURE SURGERY     left leg-knee   HEEL SPUR EXCISION Bilateral    HOT HEMOSTASIS  12/29/2011   Procedure: HOT HEMOSTASIS (ARGON PLASMA COAGULATION/BICAP);  Surgeon: Lear Ng, MD;  Location: [facility];  Service: Endoscopy;  Laterality: N/A;   HOT HEMOSTASIS N/A 05/24/2014   Procedure: HOT HEMOSTASIS (ARGON PLASMA COAGULATION/BICAP);  Surgeon: Wilford Corner, MD;  Location: [facility];  Service: Endoscopy;  Laterality: N/A;   IR BONE MARROW BIOPSY & ASPIRATION  08/13/2021   IR IMAGING GUIDED PORT INSERTION  09/09/2021   leg surgery d/t break Left    LUMBAR LAMINECTOMY/DECOMPRESSION MICRODISCECTOMY Left 04/05/2013   Procedure: LUMBAR FOUR TO FIVE LUMBAR LAMINECTOMY/DECOMPRESSION MICRODISCECTOMY 1 LEVEL;  Surgeon: Eustace Moore, MD;  Location: [facility];  Service: Neurosurgery;  Laterality: Left;   LYMPH NODE BIOPSY Left 08/30/2021   Procedure: LYMPH NODE BIOPSY;  Surgeon:  Jovita Kussmaul, MD;  Location: [facility];  Service: General;  Laterality: Left;   NISSEN FUNDOPLICATION     [place] IMPEDANCE STUDY N/A 02/22/2017   Procedure: [place] IMPEDANCE STUDY;  Surgeon: Wilford Corner, MD;  Location[facility];  Service: Endoscopy;  Laterality: N/A;   TEE WITHOUT CARDIOVERSION N/A 02/24/2016   Procedure: TRANSESOPHAGEAL ECHOCARDIOGRAM (TEE);  Surgeon: Fay Records, MD;  Location: [facility];  Service: Cardiovascular;  Laterality: N/A;   TOTAL KNEE ARTHROPLASTY Left 10/07/2016   TOTAL KNEE ARTHROPLASTY Left 10/07/2016   Procedure: TOTAL KNEE ARTHROPLASTY;  Surgeon: [name], MD;  Location: [facility];  Service: Orthopedics;  Laterality: Left;    Allergies  Allergies  Allergen Reactions   Omnicef [Cefdinir] Swelling    Tongue swelling   Rituxan [Rituximab] Shortness Of Breath    Patient given Pepcid 59m IV with fluids  patinet reported feeling better. Treatment was restarted patient was able to tolerate the rest of the treatment with no issues.    Bacitracin     Rash   Bacitracin-Polymyxin B     Rash   Duloxetine Hcl     felt funny- thinking messed up   Lexapro [Escitalopram]     Makes her feel funny and sleepy   Mirtazapine     weight gain in high doses    Penicillins Swelling    Tongue swell   Peroxide [Hydrogen Peroxide] Other (See Comments)    Redness.    Zoloft [Sertraline Hcl]     Makes her feel funny   Neosporin [Neomycin-Bacitracin Zn-Polymyx] Rash    Blisters, itching.     EKGs/Labs/Other Studies Reviewed:   The following studies were reviewed today:  Echo 08/09/20 demonstrated   1. Left ventricular ejection fraction, by estimation, is 60 to 65%. The  left ventricle has normal function. The left ventricle has no regional  wall motion abnormalities. Left ventricular diastolic parameters were  normal.   2. Right ventricular systolic function is normal. The right ventricular  size is normal.   3. Left atrial size was moderately dilated.   4. The mitral valve is degenerative. Mild mitral valve regurgitation. No  evidence of mitral stenosis. Moderate mitral annular calcification.   5. The aortic valve is normal in structure. Aortic valve regurgitation is  not visualized. No aortic stenosis is present.   6. The inferior vena cava is normal in size with greater than 50%  respiratory variability, suggesting right atrial pressure of 3 mmHg.        EKG:  EKG is  ordered today.  The ekg ordered today demonstrates NSR rate 62 bpm  Recent Labs: 09/03/2021: Magnesium 1.8; TSH 0.149 10/20/2021: ALT 18; BUN 20; Creatinine 1.28; Hemoglobin 7.6; Platelet Count 204; Potassium 4.1; Sodium 140  Recent Lipid Panel    Component Value Date/Time   CHOL 125 02/18/2016 0454   TRIG 90 02/18/2016 0454   HDL 41 02/18/2016 0454   CHOLHDL 3.0 02/18/2016 0454   VLDL 18 02/18/2016 0454   LDLCALC 66  02/18/2016 0454    Risk Assessment/Calculations:   CHA2DS2-VASc Score = 6   This indicates a 9.7% annual risk of stroke. The patient's score is based upon: CHF History: 1 HTN History: 1 Diabetes History: 0 Stroke History: 0 Vascular Disease History: 1 (aortic atherosclerosis) Age Score: 2 Gender Score: 1     Home Medications   Current Meds  Medication Sig   acetaminophen (TYLENOL) 500 MG tablet Take 1,000 mg by mouth every 6 (six) hours as  needed for moderate pain.   albuterol (VENTOLIN HFA) 108 (90 Base) MCG/ACT inhaler Inhale 1-2 puffs into the lungs every 4 (four) hours as needed for wheezing or shortness of breath.   ALPRAZolam (XANAX) 0.5 MG tablet Take 1 tablet (0.5 mg total) by mouth 3 (three) times daily as needed for anxiety.   amiodarone (PACERONE) 200 MG tablet Taking one tablet by mouth daily   apixaban (ELIQUIS) 5 MG TABS tablet Take 1 tablet (5 mg total) by mouth 2 (two) times daily.   benzonatate (TESSALON) 200 MG capsule Take 1 capsule by mouth three times daily as needed for cough   buPROPion (WELLBUTRIN XL) 300 MG 24 hr tablet Take 150 mg by mouth daily.   diphenhydrAMINE (BENADRYL) 50 MG tablet Take 1 tablet (50 mg total) by mouth at bedtime as needed for itching.   famotidine (PEPCID) 20 MG tablet Take 20 mg by mouth daily as needed for heartburn or indigestion.   fexofenadine (ALLEGRA) 180 MG tablet Take 180 mg by mouth daily.   furosemide (LASIX) 20 MG tablet Take 1 tablet (20 mg total) by mouth daily as needed for edema or fluid.   levothyroxine (SYNTHROID) 75 MCG tablet TAKE 1 TABLET DAILY BEFORE BREAKFAST (NEED APPOINTMENT FOR FURTHER REFILLS)   lidocaine-prilocaine (EMLA) cream Apply to Port-A-Cath site 1 to 2 hours prior to use   Magnesium 250 MG TABS Take 250 mg by mouth daily.   metoprolol succinate (TOPROL-XL) 25 MG 24 hr tablet Take 1 tablet (25 mg total) by mouth daily.   mirtazapine (REMERON) 30 MG tablet Take 30 mg by mouth at bedtime.    polyethylene glycol (MIRALAX) 17 g packet Take 17 g by mouth daily.   potassium chloride SA (KLOR-CON M) 20 MEQ tablet Take 1 tablet (20 mEq total) by mouth daily as needed (Take potassium only when you take the Furosemide (Lasix)).   pravastatin (PRAVACHOL) 40 MG tablet Take 40 mg by mouth at bedtime.   predniSONE (DELTASONE) 20 MG tablet Take 3 tablets (60 mg total) by mouth daily with breakfast. Take 60 mg daily x 4 days, beginning day after each chemo treatment.   sodium chloride (OCEAN) 0.65 % SOLN nasal spray Place 1 spray into both nostrils daily as needed for congestion.   Spacer/Aero-Holding Chambers (AEROCHAMBER MV) inhaler Use as instructed     Review of Systems      All other systems reviewed and are otherwise negative except as noted above.  Physical Exam    VS:  BP 126/66   Pulse 62   Ht _0  (1.702 m)   Wt 199 lb 3.2 oz (90.4 kg)   SpO2 99%   BMI 31.20 kg/m  , BMI Body mass index is 31.2 kg/m.  Wt Readings from Last 3 Encounters:  11/05/21 199 lb 3.2 oz (90.4 kg)  10/20/21 197 lb (89.4 kg)  09/29/21 176 lb 9.6 oz (80.1 kg)     GEN: Well nourished, well developed, in no acute distress. HEENT: normal. Neck: Supple, no JVD, carotid bruits, or masses. Cardiac: RRR, 3/6 systolic murmur, rubs, or gallops. No clubbing, cyanosis, edema.  Radials/PT 2+ and equal bilaterally.  Respiratory:  Respirations regular and unlabored, clear to auscultation bilaterally. GI: Soft, nontender, nondistended. MS: No deformity or atrophy. Skin: Warm and dry, no rash. Neuro:  Strength and sensation are intact. Psych: Normal affect.  Assessment & Plan    Systolic murmur -update echocardiogram -asymptomatic at this time  Persistent atrial fibrillation -last time she underwent DCCV  was back in the summer -she was suppose to get the watchman but then she had cancer -Dr. Quentin Ore f/u in 2 months after cancer treatment for watchman  -continue Amio and Eliquis, refills provided -no  bleeding on Eliquis but does have some bruising on her arms  Obstructive sleep apnea -not on CPAP -asymptomatic at this time  Hypertension -Continue current medication which includes metoprolol succinate 25 mg daily -well controlled today          Disposition: Follow up 3 months with Candee Furbish, MD or APP.  Signed, Elgie Collard, PA-C 11/05/2021, 10:53 AM [place]

## 2021-11-05 ENCOUNTER — Encounter: Payer: Self-pay | Admitting: Physician Assistant

## 2021-11-05 ENCOUNTER — Ambulatory Visit: Payer: Medicare HMO | Attending: Physician Assistant | Admitting: Physician Assistant

## 2021-11-05 ENCOUNTER — Other Ambulatory Visit: Payer: Self-pay | Admitting: *Deleted

## 2021-11-05 VITALS — BP 126/66 | HR 62 | Ht 67.0 in | Wt 199.2 lb

## 2021-11-05 DIAGNOSIS — I10 Essential (primary) hypertension: Secondary | ICD-10-CM

## 2021-11-05 DIAGNOSIS — D6869 Other thrombophilia: Secondary | ICD-10-CM | POA: Diagnosis not present

## 2021-11-05 DIAGNOSIS — R011 Cardiac murmur, unspecified: Secondary | ICD-10-CM

## 2021-11-05 DIAGNOSIS — G4733 Obstructive sleep apnea (adult) (pediatric): Secondary | ICD-10-CM

## 2021-11-05 DIAGNOSIS — I4819 Other persistent atrial fibrillation: Secondary | ICD-10-CM | POA: Diagnosis not present

## 2021-11-05 NOTE — Patient Instructions (Signed)
Medication Instructions:  1.Increase lasix to 20 mg daily for the next 3 days, then resume 20 mg every other day 2.Increase potassium to 20 meq daily for the next 3 days, then resume 20 meq every other day *If you need a refill on your cardiac medications before your next appointment, please call your pharmacy*   Lab Work: None If you have labs (blood work) drawn today and your tests are completely normal, you will receive your results only by: [place] (if you have MyChart) OR A paper copy in the mail If you have any lab test that is abnormal or we need to change your treatment, we will call you to review the results.   Testing/Procedures: Your physician has requested that you have an echocardiogram. Echocardiography is a painless test that uses sound waves to create images of your heart. It provides your doctor with information about the size and shape of your heart and how well your heart's chambers and valves are working. This procedure takes approximately one hour. There are no restrictions for this procedure. Please do NOT wear cologne, perfume, aftershave, or lotions (deodorant is allowed). Please arrive 15 minutes prior to your appointment time.    Follow-Up: At [facility], you and your health needs are our priority.  As part of our continuing mission to provide you with exceptional heart care, we have created designated Provider Care Teams.  These Care Teams include your primary Cardiologist (physician) and Advanced Practice Providers (APPs -  Physician Assistants and Nurse Practitioners) who all work together to provide you with the care you need, when you need it.   Your next appointment:    2 months with Dr Quentin Ore  3 months with Dr Marlou Porch   Other Instructions 1.Be sure to wear your compression stockings and elevate your feet and legs when sitting  2.Weigh every morning after using the restroom but before breakfast, keep a log and let us know if you have  a weight gain of 2-3 pounds overnight or 5 pounds in a week  Important Information About Sugar

## 2021-11-05 NOTE — Addendum Note (Signed)
Addended by: Juventino Slovak on: 11/05/2021 11:15 AM   Modules accepted: Orders

## 2021-11-06 ENCOUNTER — Other Ambulatory Visit: Payer: Self-pay

## 2021-11-09 ENCOUNTER — Other Ambulatory Visit: Payer: Self-pay | Admitting: Oncology

## 2021-11-10 ENCOUNTER — Inpatient Hospital Stay: Payer: Medicare HMO

## 2021-11-10 ENCOUNTER — Inpatient Hospital Stay (HOSPITAL_BASED_OUTPATIENT_CLINIC_OR_DEPARTMENT_OTHER): Payer: Medicare HMO | Admitting: Nurse Practitioner

## 2021-11-10 ENCOUNTER — Encounter: Payer: Self-pay | Admitting: Nurse Practitioner

## 2021-11-10 VITALS — BP 133/58 | HR 57 | Temp 98.1°F | Resp 18

## 2021-11-10 VITALS — BP 127/58 | HR 93 | Temp 98.2°F | Resp 18 | Ht 67.0 in | Wt 202.0 lb

## 2021-11-10 DIAGNOSIS — C8598 Non-Hodgkin lymphoma, unspecified, lymph nodes of multiple sites: Secondary | ICD-10-CM

## 2021-11-10 DIAGNOSIS — Z5189 Encounter for other specified aftercare: Secondary | ICD-10-CM | POA: Diagnosis not present

## 2021-11-10 DIAGNOSIS — C8330 Diffuse large B-cell lymphoma, unspecified site: Secondary | ICD-10-CM | POA: Diagnosis not present

## 2021-11-10 DIAGNOSIS — Z5111 Encounter for antineoplastic chemotherapy: Secondary | ICD-10-CM | POA: Diagnosis not present

## 2021-11-10 DIAGNOSIS — Z5112 Encounter for antineoplastic immunotherapy: Secondary | ICD-10-CM | POA: Diagnosis not present

## 2021-11-10 DIAGNOSIS — K5900 Constipation, unspecified: Secondary | ICD-10-CM | POA: Diagnosis not present

## 2021-11-10 DIAGNOSIS — K1379 Other lesions of oral mucosa: Secondary | ICD-10-CM | POA: Diagnosis not present

## 2021-11-10 DIAGNOSIS — R0609 Other forms of dyspnea: Secondary | ICD-10-CM | POA: Diagnosis not present

## 2021-11-10 DIAGNOSIS — D696 Thrombocytopenia, unspecified: Secondary | ICD-10-CM | POA: Diagnosis not present

## 2021-11-10 DIAGNOSIS — D539 Nutritional anemia, unspecified: Secondary | ICD-10-CM | POA: Diagnosis not present

## 2021-11-10 LAB — CBC WITH DIFFERENTIAL (CANCER CENTER ONLY)
Abs Immature Granulocytes: 0.03 10*3/uL (ref 0.00–0.07)
Basophils Absolute: 0 10*3/uL (ref 0.0–0.1)
Basophils Relative: 1 %
Eosinophils Absolute: 0.1 10*3/uL (ref 0.0–0.5)
Eosinophils Relative: 3 %
HCT: 31.9 % — ABNORMAL LOW (ref 36.0–46.0)
Hemoglobin: 9.9 g/dL — ABNORMAL LOW (ref 12.0–15.0)
Immature Granulocytes: 1 %
Lymphocytes Relative: 27 %
Lymphs Abs: 0.9 10*3/uL (ref 0.7–4.0)
MCH: 31.7 pg (ref 26.0–34.0)
MCHC: 31 g/dL (ref 30.0–36.0)
MCV: 102.2 fL — ABNORMAL HIGH (ref 80.0–100.0)
Monocytes Absolute: 0.5 10*3/uL (ref 0.1–1.0)
Monocytes Relative: 15 %
Neutro Abs: 1.8 10*3/uL (ref 1.7–7.7)
Neutrophils Relative %: 53 %
Platelet Count: 168 10*3/uL (ref 150–400)
RBC: 3.12 MIL/uL — ABNORMAL LOW (ref 3.87–5.11)
RDW: 19.5 % — ABNORMAL HIGH (ref 11.5–15.5)
WBC Count: 3.3 10*3/uL — ABNORMAL LOW (ref 4.0–10.5)
nRBC: 0 % (ref 0.0–0.2)

## 2021-11-10 LAB — CMP (CANCER CENTER ONLY)
ALT: 14 U/L (ref 0–44)
AST: 15 U/L (ref 15–41)
Albumin: 3.5 g/dL (ref 3.5–5.0)
Alkaline Phosphatase: 69 U/L (ref 38–126)
Anion gap: 10 (ref 5–15)
BUN: 22 mg/dL (ref 8–23)
CO2: 27 mmol/L (ref 22–32)
Calcium: 9.2 mg/dL (ref 8.9–10.3)
Chloride: 105 mmol/L (ref 98–111)
Creatinine: 1.29 mg/dL — ABNORMAL HIGH (ref 0.44–1.00)
GFR, Estimated: 43 mL/min — ABNORMAL LOW (ref >60)
Glucose, Bld: 136 mg/dL — ABNORMAL HIGH (ref 70–99)
Potassium: 4 mmol/L (ref 3.5–5.1)
Sodium: 142 mmol/L (ref 135–145)
Total Bilirubin: 0.4 mg/dL (ref 0.3–1.2)
Total Protein: 5.4 g/dL — ABNORMAL LOW (ref 6.5–8.1)

## 2021-11-10 LAB — SAMPLE TO BLOOD BANK

## 2021-11-10 LAB — LACTATE DEHYDROGENASE: LDH: 179 U/L (ref 98–192)

## 2021-11-10 MED ORDER — SODIUM CHLORIDE 0.9 % IV SOLN
150.0000 mg | Freq: Once | INTRAVENOUS | Status: AC
  Administered 2021-11-10: 150 mg via INTRAVENOUS
  Filled 2021-11-10: qty 5

## 2021-11-10 MED ORDER — HEPARIN SOD (PORK) LOCK FLUSH 100 UNIT/ML IV SOLN
500.0000 [IU] | Freq: Once | INTRAVENOUS | Status: AC | PRN
  Administered 2021-11-10: 500 [IU]

## 2021-11-10 MED ORDER — PALONOSETRON HCL INJECTION 0.25 MG/5ML
0.2500 mg | Freq: Once | INTRAVENOUS | Status: AC
  Administered 2021-11-10: 0.25 mg via INTRAVENOUS
  Filled 2021-11-10: qty 5

## 2021-11-10 MED ORDER — SODIUM CHLORIDE 0.9 % IV SOLN: Freq: Once | INTRAVENOUS | Status: AC

## 2021-11-10 MED ORDER — VINCRISTINE SULFATE CHEMO INJECTION 1 MG/ML
1.0000 mg | Freq: Once | INTRAVENOUS | Status: AC
  Administered 2021-11-10: 1 mg via INTRAVENOUS
  Filled 2021-11-10: qty 1

## 2021-11-10 MED ORDER — SODIUM CHLORIDE 0.9 % IV SOLN
375.0000 mg/m2 | Freq: Once | INTRAVENOUS | Status: AC
  Administered 2021-11-10: 800 mg via INTRAVENOUS
  Filled 2021-11-10: qty 50

## 2021-11-10 MED ORDER — DOXORUBICIN HCL CHEMO IV INJECTION 2 MG/ML
25.0000 mg/m2 | Freq: Once | INTRAVENOUS | Status: AC
  Administered 2021-11-10: 52 mg via INTRAVENOUS
  Filled 2021-11-10: qty 26

## 2021-11-10 MED ORDER — SODIUM CHLORIDE 0.9 % IV SOLN
10.0000 mg | Freq: Once | INTRAVENOUS | Status: AC
  Administered 2021-11-10: 10 mg via INTRAVENOUS
  Filled 2021-11-10: qty 10

## 2021-11-10 MED ORDER — SODIUM CHLORIDE 0.9% FLUSH
10.0000 mL | INTRAVENOUS | Status: DC | PRN
  Administered 2021-11-10: 10 mL

## 2021-11-10 MED ORDER — SODIUM CHLORIDE 0.9 % IV SOLN
490.0000 mg/m2 | Freq: Once | INTRAVENOUS | Status: AC
  Administered 2021-11-10: 1000 mg via INTRAVENOUS
  Filled 2021-11-10: qty 50

## 2021-11-10 MED ORDER — DIPHENHYDRAMINE HCL 25 MG PO CAPS
50.0000 mg | ORAL_CAPSULE | Freq: Once | ORAL | Status: AC
  Administered 2021-11-10: 50 mg via ORAL
  Filled 2021-11-10: qty 2

## 2021-11-10 MED ORDER — ACETAMINOPHEN 325 MG PO TABS
650.0000 mg | ORAL_TABLET | Freq: Once | ORAL | Status: AC
  Administered 2021-11-10: 650 mg via ORAL
  Filled 2021-11-10: qty 2

## 2021-11-10 MED ORDER — FAMOTIDINE IN NACL 20-0.9 MG/50ML-% IV SOLN
20.0000 mg | Freq: Once | INTRAVENOUS | Status: AC
  Administered 2021-11-10: 20 mg via INTRAVENOUS
  Filled 2021-11-10: qty 50

## 2021-11-10 NOTE — Progress Notes (Signed)
Patient seen by Lisa Thomas NP today  Vitals are within treatment parameters.  Labs reviewed by Lisa Thomas NP and are within treatment parameters.  Per physician team, patient is ready for treatment and there are NO modifications to the treatment plan.     

## 2021-11-10 NOTE — Patient Instructions (Signed)
[facility]   Discharge Instructions: Thank you for choosing [facility] to provide your oncology and hematology care.   If you have a lab appointment with the [facility], please go directly to the [facility] and check in at the registration area.   Wear comfortable clothing and clothing appropriate for easy access to any Portacath or PICC line.   We strive to give you quality time with your provider. You may need to reschedule your appointment if you arrive late (15 or more minutes).  Arriving late affects you and other patients whose appointments are after yours.  Also, if you miss three or more appointments without notifying the office, you may be dismissed from the clinic at the provider's discretion.      For prescription refill requests, have your pharmacy contact our office and allow 72 hours for refills to be completed.    Today you received the following chemotherapy and/or immunotherapy agents Doxorubicin, Vincristine, Cyclophosphamide, Rituximab      To help prevent nausea and vomiting after your treatment, we encourage you to take your nausea medication as directed.  BELOW ARE SYMPTOMS THAT SHOULD BE REPORTED IMMEDIATELY: *FEVER GREATER THAN 100.4 F (38 C) OR HIGHER *CHILLS OR SWEATING *NAUSEA AND VOMITING THAT IS NOT CONTROLLED WITH YOUR NAUSEA MEDICATION *UNUSUAL SHORTNESS OF BREATH *UNUSUAL BRUISING OR BLEEDING *URINARY PROBLEMS (pain or burning when urinating, or frequent urination) *BOWEL PROBLEMS (unusual diarrhea, constipation, pain near the anus) TENDERNESS IN MOUTH AND THROAT WITH OR WITHOUT PRESENCE OF ULCERS (sore throat, sores in mouth, or a toothache) UNUSUAL RASH, SWELLING OR PAIN  UNUSUAL VAGINAL DISCHARGE OR ITCHING   Items with * indicate a potential emergency and should be followed up as soon as possible or go to the Emergency Department if any problems should occur.  Please show the CHEMOTHERAPY ALERT CARD  or IMMUNOTHERAPY ALERT CARD at check-in to the Emergency Department and triage nurse.  Should you have questions after your visit or need to cancel or reschedule your appointment, please contact [facility]  Dept: [phone]  and follow the prompts.  Office hours are 8:00 a.m. to 4:30 p.m. Monday - Friday. Please note that voicemails left after 4:00 p.m. may not be returned until the following business day.  We are closed weekends and major holidays. You have access to a nurse at all times for urgent questions. Please call the main number to the clinic Dept: [phone] and follow the prompts.   For any non-urgent questions, you may also contact your provider using MyChart. We now offer e-Visits for anyone 76 and older to request care online for non-urgent symptoms. For details visit [email or web address].   Also download the MyChart app! Go to the app store, search "MyChart", open the app, select [facility], and log in with your MyChart username and password.  Masks are optional in the cancer centers. If you would like for your care team to wear a mask while they are taking care of you, please let them know. You may have one support person who is at least 75 years old accompany you for your appointments.  Doxorubicin Injection What is this medication? DOXORUBICIN (dox oh ROO bi sin) treats some types of cancer. It works by slowing down the growth of cancer cells. This medicine may be used for other purposes; ask your health care provider or pharmacist if you have questions. COMMON BRAND NAME(S): Adriamycin, Adriamycin PFS, Adriamycin RDF, Rubex What should  I tell my care team before I take this medication? They need to know if you have any of these conditions: Heart disease History of low blood cell levels caused by a medication Liver disease Recent or ongoing radiation An unusual or allergic reaction to doxorubicin, other medications, foods, dyes, or  preservatives If you or your partner are pregnant or trying to get pregnant Breast-feeding How should I use this medication? This medication is injected into a vein. It is given by your care team in a hospital or clinic setting. Talk to your care team about the use of this medication in children. Special care may be needed. Overdosage: If you think you have taken too much of this medicine contact a poison control center or emergency room at once. NOTE: This medicine is only for you. Do not share this medicine with others. What if I miss a dose? Keep appointments for follow-up doses. It is important not to miss your dose. Call your care team if you are unable to keep an appointment. What may interact with this medication? 6-mercaptopurine Paclitaxel Phenytoin St. John's wort Trastuzumab Verapamil This list may not describe all possible interactions. Give your health care provider a list of all the medicines, herbs, non-prescription drugs, or dietary supplements you use. Also tell them if you smoke, drink alcohol, or use illegal drugs. Some items may interact with your medicine. What should I watch for while using this medication? Your condition will be monitored carefully while you are receiving this medication. You may need blood work while taking this medication. This medication may make you feel generally unwell. This is not uncommon as chemotherapy can affect healthy cells as well as cancer cells. Report any side effects. Continue your course of treatment even though you feel ill unless your care team tells you to stop. There is a maximum amount of this medication you should receive throughout your life. The amount depends on the medical condition being treated and your overall health. Your care team will watch how much of this medication you receive. Tell your care team if you have taken this medication before. Your urine may turn red for a few days after your dose. This is not blood. If  your urine is dark or brown, call your care team. In some cases, you may be given additional medications to help with side effects. Follow all directions for their use. This medication may increase your risk of getting an infection. Call your care team for advice if you get a fever, chills, sore throat, or other symptoms of a cold or flu. Do not treat yourself. Try to avoid being around people who are sick. This medication may increase your risk to bruise or bleed. Call your care team if you notice any unusual bleeding. Talk to your care team about your risk of cancer. You may be more at risk for certain types of cancers if you take this medication. You should make sure that you get enough Coenzyme Q10 while you are taking this medication. Discuss the foods you eat and the vitamins you take with your care team. Talk to your care team if you or your partner may be pregnant. Serious birth defects can occur if you take this medication during pregnancy and for 6 months after the last dose. Contraception is recommended while taking this medication and for 6 months after the last dose. Your care team can help you find the option that works for you. If your partner can get pregnant, use a  condom while taking this medication and for 6 months after the last dose. Do not breastfeed while taking this medication. This medication may cause infertility. Talk to your care team if you are concerned about your fertility. What side effects may I notice from receiving this medication? Side effects that you should report to your care team as soon as possible: Allergic reactions--skin rash, itching, hives, swelling of the face, lips, tongue, or throat Heart failure--shortness of breath, swelling of the ankles, feet, or hands, sudden weight gain, unusual weakness or fatigue Heart rhythm changes--fast or irregular heartbeat, dizziness, feeling faint or lightheaded, chest pain, trouble breathing Infection--fever, chills,  cough, sore throat, wounds that don't heal, pain or trouble when passing urine, general feeling of discomfort or being unwell Low red blood cell level--unusual weakness or fatigue, dizziness, headache, trouble breathing Painful swelling, warmth, or redness of the skin, blisters or sores at the infusion site Unusual bruising or bleeding Side effects that usually do not require medical attention (report to your care team if they continue or are bothersome): Diarrhea Hair loss Nausea Pain, redness, or swelling with sores inside the mouth or throat Red urine This list may not describe all possible side effects. Call your doctor for medical advice about side effects. You may report side effects to FDA at 1-800-FDA-1088. Where should I keep my medication? This medication is given in a hospital or clinic. It will not be stored at home. NOTE: This sheet is a summary. It may not cover all possible information. If you have questions about this medicine, talk to your doctor, pharmacist, or health care provider.  2023 Elsevier/Gold Standard (2021-05-07 00:00:00)  Vincristine Injection What is this medication? VINCRISTINE (vin KRIS teen) treats some types of cancer. It works by slowing down the growth of cancer cells. This medicine may be used for other purposes; ask your health care provider or pharmacist if you have questions. COMMON BRAND NAME(S): Oncovin, Vincasar PFS What should I tell my care team before I take this medication? They need to know if you have any of these conditions: Infection Kidney disease Liver disease Low white blood cell levels Lung disease Nervous system disease, such as Charcot-Marie-Tooth (CMT) Recent or ongoing radiation therapy An unusual or allergic reaction to vincristine, other chemotherapy agents, other medications, foods, dyes, or preservatives Pregnant or trying to get pregnant Breast-feeding How should I use this medication? This medication is infused into  a vein. It is given by your care team in a hospital or clinic setting. Talk to your care team about the use of this medication in children. While it may be given to children for selected conditions, precautions do apply. Overdosage: If you think you have taken too much of this medicine contact a poison control center or emergency room at once. NOTE: This medicine is only for you. Do not share this medicine with others. What if I miss a dose? Keep appointments for follow-up doses. It is important not to miss your dose. Call your care team if you are unable to keep an appointment. What may interact with this medication? Do not take this medication with any of the following: Live virus vaccines This medication may also interact with the following: Medications for fungal infections, such as itraconazole or fluconazole Phenytoin Supplements, such as St. John's wort This list may not describe all possible interactions. Give your health care provider a list of all the medicines, herbs, non-prescription drugs, or dietary supplements you use. Also tell them if you smoke, drink  alcohol, or use illegal drugs. Some items may interact with your medicine. What should I watch for while using this medication? Your condition will be monitored carefully while you are receiving this medication. This medication may make you feel generally unwell. This is not uncommon as chemotherapy can affect healthy cells as well as cancer cells. Report any side effects. Continue your course of treatment even though you feel ill unless your care team tells you to stop. You may need blood work while taking this medication. This medication may increase your risk to bruise or bleed. Call your care team if you notice any unusual bleeding. This medication may increase your risk of getting an infection. Call your care team for advice if you get a fever, chills, sore throat, or other symptoms of a cold or flu. Do not treat yourself. Try  to avoid being around people who are sick. This medication will cause constipation. If you do not have a bowel movement for 3 days, call your care team. Call your care team if you are around anyone with measles, chickenpox, or if you develop sores or blisters that do not heal properly. Be careful brushing or flossing your teeth or using a toothpick because you may get an infection or bleed more easily. If you have any dental work done, tell your dentist you are receiving this medication Talk to your care team if you or your partner wish to become pregnant or think either of you might be pregnant. This medication can cause serious birth defects. This medication may cause infertility. Talk to your care team if you are concerned about your fertility. Talk to your care team before breastfeeding. Changes to your treatment plan may be needed. What side effects may I notice from receiving this medication? Side effects that you should report to your care team as soon as possible: Allergic reactions--skin rash, itching, hives, swelling of the face, lips, tongue, or throat High uric acid level--severe pain, redness, warmth, or swelling in joints, pain or trouble passing urine, pain in the lower back or sides Infection--fever, chills, cough, sore throat, wounds that don't heal, pain or trouble when passing urine, general feeling of discomfort or being unwell Pain, tingling, or numbness in the hands or feet, muscle weakness, change in vision, confusion or trouble speaking, loss of balance or coordination, trouble walking, seizures Painful swelling, warmth, or redness of the skin, blisters or sores at the infusion site Shortness of breath or trouble breathing Side effects that usually do not require medical attention (report to your care team if they continue or are bothersome): Constipation Diarrhea Hair loss Loss of appetite Nausea Stomach cramping Vomiting This list may not describe all possible side  effects. Call your doctor for medical advice about side effects. You may report side effects to FDA at 1-800-FDA-1088. Where should I keep my medication? This medication is given in a hospital or clinic. It will not be stored at home. NOTE: This sheet is a summary. It may not cover all possible information. If you have questions about this medicine, talk to your doctor, pharmacist, or health care provider.  2023 Elsevier/Gold Standard (2021-03-25 00:00:00)  Cyclophosphamide Injection What is this medication? CYCLOPHOSPHAMIDE (sye kloe FOSS fa mide) treats some types of cancer. It works by slowing down the growth of cancer cells. This medicine may be used for other purposes; ask your health care provider or pharmacist if you have questions. COMMON BRAND NAME(S): Cyclophosphamide, Cytoxan, Neosar What should I tell my care team before  I take this medication? They need to know if you have any of these conditions: Heart disease Irregular heartbeat or rhythm Infection Kidney problems Liver disease Low blood cell levels (white cells, platelets, or red blood cells) Lung disease Previous radiation Trouble passing urine An unusual or allergic reaction to cyclophosphamide, other medications, foods, dyes, or preservatives Pregnant or trying to get pregnant Breast-feeding How should I use this medication? This medication is injected into a vein. It is given by your care team in a hospital or clinic setting. Talk to your care team about the use of this medication in children. Special care may be needed. Overdosage: If you think you have taken too much of this medicine contact a poison control center or emergency room at once. NOTE: This medicine is only for you. Do not share this medicine with others. What if I miss a dose? Keep appointments for follow-up doses. It is important not to miss your dose. Call your care team if you are unable to keep an appointment. What may interact with this  medication? Amphotericin B Amiodarone Azathioprine Certain antivirals for HIV or hepatitis Certain medications for blood pressure, such as enalapril, lisinopril, quinapril Cyclosporine Diuretics Etanercept Indomethacin Medications that relax muscles Metronidazole Natalizumab Tamoxifen Warfarin This list may not describe all possible interactions. Give your health care provider a list of all the medicines, herbs, non-prescription drugs, or dietary supplements you use. Also tell them if you smoke, drink alcohol, or use illegal drugs. Some items may interact with your medicine. What should I watch for while using this medication? This medication may make you feel generally unwell. This is not uncommon as chemotherapy can affect healthy cells as well as cancer cells. Report any side effects. Continue your course of treatment even though you feel ill unless your care team tells you to stop. You may need blood work while you are taking this medication. This medication may increase your risk of getting an infection. Call your care team for advice if you get a fever, chills, sore throat, or other symptoms of a cold or flu. Do not treat yourself. Try to avoid being around people who are sick. Avoid taking medications that contain aspirin, acetaminophen, ibuprofen, naproxen, or ketoprofen unless instructed by your care team. These medications may hide a fever. Be careful brushing or flossing your teeth or using a toothpick because you may get an infection or bleed more easily. If you have any dental work done, tell your dentist you are receiving this medication. Drink water or other fluids as directed. Urinate often, even at night. Some products may contain alcohol. Ask your care team if this medication contains alcohol. Be sure to tell all care teams you are taking this medicine. Certain medicines, like metronidazole and disulfiram, can cause an unpleasant reaction when taken with alcohol. The reaction  includes flushing, headache, nausea, vomiting, sweating, and increased thirst. The reaction can last from 30 minutes to several hours. Talk to your care team if you wish to become pregnant or think you might be pregnant. This medication can cause serious birth defects if taken during pregnancy and for 1 year after the last dose. A negative pregnancy test is required before starting this medication. A reliable form of contraception is recommended while taking this medication and for 1 year after the last dose. Talk to your care team about reliable forms of contraception. Do not father a child while taking this medication and for 4 months after the last dose. Use a condom during  this time period. Do not breast-feed while taking this medication or for 1 week after the last dose. This medication may cause infertility. Talk to your care team if you are concerned about your fertility. Talk to your care team about your risk of cancer. You may be more at risk for certain types of cancer if you take this medication. What side effects may I notice from receiving this medication? Side effects that you should report to your care team as soon as possible: Allergic reactions--skin rash, itching, hives, swelling of the face, lips, tongue, or throat Dry cough, shortness of breath or trouble breathing Heart failure--shortness of breath, swelling of the ankles, feet, or hands, sudden weight gain, unusual weakness or fatigue Heart muscle inflammation--unusual weakness or fatigue, shortness of breath, chest pain, fast or irregular heartbeat, dizziness, swelling of the ankles, feet, or hands Heart rhythm changes--fast or irregular heartbeat, dizziness, feeling faint or lightheaded, chest pain, trouble breathing Infection--fever, chills, cough, sore throat, wounds that don't heal, pain or trouble when passing urine, general feeling of discomfort or being unwell Kidney injury--decrease in the amount of urine, swelling of the  ankles, hands, or feet Liver injury--right upper belly pain, loss of appetite, nausea, light-colored stool, dark yellow or brown urine, yellowing skin or eyes, unusual weakness or fatigue Low red blood cell level--unusual weakness or fatigue, dizziness, headache, trouble breathing Low sodium level--muscle weakness, fatigue, dizziness, headache, confusion Red or dark brown urine Unusual bruising or bleeding Side effects that usually do not require medical attention (report to your care team if they continue or are bothersome): Hair loss Irregular menstrual cycles or spotting Loss of appetite Nausea Pain, redness, or swelling with sores inside the mouth or throat Vomiting This list may not describe all possible side effects. Call your doctor for medical advice about side effects. You may report side effects to FDA at 1-800-FDA-1088. Where should I keep my medication? This medication is given in a hospital or clinic. It will not be stored at home. NOTE: This sheet is a summary. It may not cover all possible information. If you have questions about this medicine, talk to your doctor, pharmacist, or health care provider.  2023 Elsevier/Gold Standard (2021-02-18 00:00:00)  Rituximab Injection What is this medication? RITUXIMAB (ri TUX i mab) treats leukemia and lymphoma. It works by blocking a protein that causes cancer cells to grow and multiply. This helps to slow or stop the spread of cancer cells. It may also be used to treat autoimmune conditions, such as arthritis. It works by slowing down an overactive immune system. It is a monoclonal antibody. This medicine may be used for other purposes; ask your health care provider or pharmacist if you have questions. COMMON BRAND NAME(S): RIABNI, Rituxan, RUXIENCE, truxima What should I tell my care team before I take this medication? They need to know if you have any of these conditions: Chest pain Heart disease Immune system problems Infection,  such as chickenpox, cold sores, hepatitis B, herpes Irregular heartbeat or rhythm Kidney disease Low blood counts, such as low white cells, platelets, red cells Lung disease Recent or upcoming vaccine An unusual or allergic reaction to rituximab, other medications, foods, dyes, or preservatives Pregnant or trying to get pregnant Breast-feeding How should I use this medication? This medication is injected into a vein. It is given by a care team in a hospital or clinic setting. A special MedGuide will be given to you before each treatment. Be sure to read this information carefully each  time. Talk to your care team about the use of this medication in children. While this medication may be prescribed for children as young as 6 months for selected conditions, precautions do apply. Overdosage: If you think you have taken too much of this medicine contact a poison control center or emergency room at once. NOTE: This medicine is only for you. Do not share this medicine with others. What if I miss a dose? Keep appointments for follow-up doses. It is important not to miss your dose. Call your care team if you are unable to keep an appointment. What may interact with this medication? Do not take this medication with any of the following: Live vaccines This medication may also interact with the following: Cisplatin This list may not describe all possible interactions. Give your health care provider a list of all the medicines, herbs, non-prescription drugs, or dietary supplements you use. Also tell them if you smoke, drink alcohol, or use illegal drugs. Some items may interact with your medicine. What should I watch for while using this medication? Your condition will be monitored carefully while you are receiving this medication. You may need blood work while taking this medication. This medication can cause serious infusion reactions. To reduce the risk your care team may give you other medications  to take before receiving this one. Be sure to follow the directions from your care team. This medication may increase your risk of getting an infection. Call your care team for advice if you get a fever, chills, sore throat, or other symptoms of a cold or flu. Do not treat yourself. Try to avoid being around people who are sick. Call your care team if you are around anyone with measles, chickenpox, or if you develop sores or blisters that do not heal properly. Avoid taking medications that contain aspirin, acetaminophen, ibuprofen, naproxen, or ketoprofen unless instructed by your care team. These medications may hide a fever. This medication may cause serious skin reactions. They can happen weeks to months after starting the medication. Contact your care team right away if you notice fevers or flu-like symptoms with a rash. The rash may be red or purple and then turn into blisters or peeling of the skin. You may also notice a red rash with swelling of the face, lips, or lymph nodes in your neck or under your arms. In some patients, this medication may cause a serious brain infection that may cause death. If you have any problems seeing, thinking, speaking, walking, or standing, tell your care team right away. If you cannot reach your care team, urgently seek another source of medical care. Talk to your care team if you may be pregnant. Serious birth defects can occur if you take this medication during pregnancy and for 12 months after the last dose. You will need a negative pregnancy test before starting this medication. Contraception is recommended while taking this medication and for 12 months after the last dose. Your care team can help you find the option that works for you. Do not breastfeed while taking this medication and for at least 6 months after the last dose. What side effects may I notice from receiving this medication? Side effects that you should report to your care team as soon as  possible: Allergic reactions or angioedema--skin rash, itching or hives, swelling of the face, eyes, lips, tongue, arms, or legs, trouble swallowing or breathing Bowel blockage--stomach cramping, unable to have a bowel movement or pass gas, loss of appetite, vomiting Dizziness,  loss of balance or coordination, confusion or trouble speaking Heart attack--pain or tightness in the chest, shoulders, arms, or jaw, nausea, shortness of breath, cold or clammy skin, feeling faint or lightheaded Heart rhythm changes--fast or irregular heartbeat, dizziness, feeling faint or lightheaded, chest pain, trouble breathing Infection--fever, chills, cough, sore throat, wounds that don't heal, pain or trouble when passing urine, general feeling of discomfort or being unwell Infusion reactions--chest pain, shortness of breath or trouble breathing, feeling faint or lightheaded Kidney injury--decrease in the amount of urine, swelling of the ankles, hands, or feet Liver injury--right upper belly pain, loss of appetite, nausea, light-colored stool, dark yellow or brown urine, yellowing skin or eyes, unusual weakness or fatigue Redness, blistering, peeling, or loosening of the skin, including inside the mouth Stomach pain that is severe, does not go away, or gets worse Tumor lysis syndrome (TLS)--nausea, vomiting, diarrhea, decrease in the amount of urine, dark urine, unusual weakness or fatigue, confusion, muscle pain or cramps, fast or irregular heartbeat, joint pain Side effects that usually do not require medical attention (report to your care team if they continue or are bothersome): Headache Joint pain Nausea Runny or stuffy nose Unusual weakness or fatigue This list may not describe all possible side effects. Call your doctor for medical advice about side effects. You may report side effects to FDA at 1-800-FDA-1088. Where should I keep my medication? This medication is given in a hospital or clinic. It will not  be stored at home. NOTE: This sheet is a summary. It may not cover all possible information. If you have questions about this medicine, talk to your doctor, pharmacist, or health care provider.  2023 Elsevier/Gold Standard (2021-05-13 00:00:00)

## 2021-11-10 NOTE — Progress Notes (Signed)
  [place] OFFICE PROGRESS NOTE   Diagnosis: Non-Hodgkin's lymphoma  INTERVAL HISTORY:   [name] [name] returns as scheduled.  She completed cycle 3 R-CHOP 10/20/2021.  She feels "great".  No nausea or vomiting.  No mouth sores.  No diarrhea or constipation.  No rash.  No numbness or tingling in the hands or feet.  She has a good appetite.  She describes her energy level as "pretty good".  Objective:  Vital signs in last 24 hours:  Blood pressure (!) 127/58, pulse 93, temperature 98.2 F (36.8 C), temperature source Oral, resp. rate 18, height _0  (1.702 m), weight 202 lb (91.6 kg), SpO2 98 %.    HEENT: No thrush or ulcers. Lymphatics: Smooth rounded fullness left inguinal region. Resp: Lungs clear bilaterally. Cardio: Regular rate and rhythm. GI: Abdomen soft and nontender.  No hepatosplenomegaly. Vascular: Very minimal lower leg edema bilaterally. Neuro: Vibratory sense mildly decreased over the fingertips per tuning fork exam. Port-A-Cath without erythema.  Lab Results:  Lab Results  Component Value Date   WBC 3.3 (L) 11/10/2021   HGB 9.9 (L) 11/10/2021   HCT 31.9 (L) 11/10/2021   MCV 102.2 (H) 11/10/2021   PLT 168 11/10/2021   NEUTROABS 1.8 11/10/2021    Imaging:  No results found.  Medications: I have reviewed the patient's current medications.  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  Negative myeloma FISH panel, 46XX; neotype myeloid disorders profile NGS sequencing-no pathogenic mutations detected in any of the genes on the NGS panel -PNH screen - 08/26/2020 -CTs 02/17/2021-new splenomegaly; diffuse bilateral bronchial wall  thickening; clustered groundglass and fine nodular opacity in the dependent right lower lobe.  CAD. -Bone marrow biopsy 08/13/2021-hypercellular bone marrow for age with trilineage hematopoiesis; several atypical lymphoid aggregates present; no increase in blastic cells; flow cytometry without significant T or B-cell abnormalities; normal cytogenetics; "the limited morphologic and immunohistochemical features are atypical and a lymphoproliferative process is not excluded" -PET scan 1/93/7902-[id number] hypermetabolic lymphadenopathy in the neck, left axilla, mediastinum, retroperitoneum, and left pelvis.  Hypermetabolic skeletal lesions, generalized bilateral pulmonary hypermetabolism -08/30/2021-1 unit RBCs -Excisional lymph node biopsy left groin 08/30/2021-Large B-cell lymphoma, CD20 positive, CD30 positive; FISH analysis-no evidence of a double/triple hit lymphoma -Cycle 1 R-CHOP 09/05/2021, Granix 09/06/2021 for 7 days -Cycle 2 R-CHOP 09/29/2021, Udenyca -Cycle 3 R-CHOP 10/20/2021, Udenyca -Cycle 4 R-CHOP 11/10/2021, Udenyca Fatigue Dyspnea on exertion Unsteady gait/balance disorder, followed by Dr. Tomi Likens CKD Atrial fibrillation Hypertension Sleep apnea Anorexia/weight loss 06/13/2021 skin rash, question vasculitis-course of prednisone initiated; 06/20/2021 marked improvement, rash recurred when she was tapered off of prednisone, prednisone resumed by dermatology History of coagulopathy-most likely related to malnutrition and apixaban Port-A-Cath placement 09/09/2021    Disposition: [name] appears stable.  She has completed 3 cycles of CHOP/Rituxan.  She is tolerating treatment well and performance status continues to improve.  Plan to proceed with cycle 4 today as scheduled.  Restaging PET scan prior to next office visit.  CBC and chemistry panel reviewed.  Labs adequate to proceed as above.  She will return for follow-up in 3 weeks.  We are available to see her sooner if needed.    Ned Card ANP/GNP-BC   11/10/2021  8:45 AM

## 2021-11-12 ENCOUNTER — Inpatient Hospital Stay: Payer: Medicare HMO

## 2021-11-12 ENCOUNTER — Inpatient Hospital Stay: Payer: Medicare HMO | Attending: Oncology

## 2021-11-12 VITALS — BP 151/72 | HR 71 | Temp 97.3°F | Resp 19

## 2021-11-12 DIAGNOSIS — R21 Rash and other nonspecific skin eruption: Secondary | ICD-10-CM | POA: Insufficient documentation

## 2021-11-12 DIAGNOSIS — Z5189 Encounter for other specified aftercare: Secondary | ICD-10-CM | POA: Insufficient documentation

## 2021-11-12 DIAGNOSIS — D539 Nutritional anemia, unspecified: Secondary | ICD-10-CM | POA: Insufficient documentation

## 2021-11-12 DIAGNOSIS — C8338 Diffuse large B-cell lymphoma, lymph nodes of multiple sites: Secondary | ICD-10-CM | POA: Insufficient documentation

## 2021-11-12 DIAGNOSIS — I129 Hypertensive chronic kidney disease with stage 1 through stage 4 chronic kidney disease, or unspecified chronic kidney disease: Secondary | ICD-10-CM | POA: Diagnosis not present

## 2021-11-12 DIAGNOSIS — R630 Anorexia: Secondary | ICD-10-CM | POA: Diagnosis not present

## 2021-11-12 DIAGNOSIS — I4891 Unspecified atrial fibrillation: Secondary | ICD-10-CM | POA: Insufficient documentation

## 2021-11-12 DIAGNOSIS — R0609 Other forms of dyspnea: Secondary | ICD-10-CM | POA: Insufficient documentation

## 2021-11-12 DIAGNOSIS — R5383 Other fatigue: Secondary | ICD-10-CM | POA: Insufficient documentation

## 2021-11-12 DIAGNOSIS — R161 Splenomegaly, not elsewhere classified: Secondary | ICD-10-CM | POA: Diagnosis not present

## 2021-11-12 DIAGNOSIS — R2681 Unsteadiness on feet: Secondary | ICD-10-CM | POA: Diagnosis not present

## 2021-11-12 DIAGNOSIS — Z79899 Other long term (current) drug therapy: Secondary | ICD-10-CM | POA: Insufficient documentation

## 2021-11-12 DIAGNOSIS — R634 Abnormal weight loss: Secondary | ICD-10-CM | POA: Insufficient documentation

## 2021-11-12 DIAGNOSIS — D6869 Other thrombophilia: Secondary | ICD-10-CM | POA: Diagnosis not present

## 2021-11-12 DIAGNOSIS — R918 Other nonspecific abnormal finding of lung field: Secondary | ICD-10-CM | POA: Diagnosis not present

## 2021-11-12 DIAGNOSIS — F331 Major depressive disorder, recurrent, moderate: Secondary | ICD-10-CM | POA: Diagnosis not present

## 2021-11-12 DIAGNOSIS — N183 Chronic kidney disease, stage 3 unspecified: Secondary | ICD-10-CM | POA: Diagnosis not present

## 2021-11-12 DIAGNOSIS — Z5112 Encounter for antineoplastic immunotherapy: Secondary | ICD-10-CM | POA: Diagnosis present

## 2021-11-12 DIAGNOSIS — E032 Hypothyroidism due to medicaments and other exogenous substances: Secondary | ICD-10-CM | POA: Diagnosis not present

## 2021-11-12 DIAGNOSIS — I2510 Atherosclerotic heart disease of native coronary artery without angina pectoris: Secondary | ICD-10-CM | POA: Insufficient documentation

## 2021-11-12 DIAGNOSIS — N189 Chronic kidney disease, unspecified: Secondary | ICD-10-CM | POA: Insufficient documentation

## 2021-11-12 DIAGNOSIS — I480 Paroxysmal atrial fibrillation: Secondary | ICD-10-CM | POA: Diagnosis not present

## 2021-11-12 DIAGNOSIS — C8330 Diffuse large B-cell lymphoma, unspecified site: Secondary | ICD-10-CM | POA: Diagnosis present

## 2021-11-12 DIAGNOSIS — Z0000 Encounter for general adult medical examination without abnormal findings: Secondary | ICD-10-CM | POA: Diagnosis not present

## 2021-11-12 DIAGNOSIS — E785 Hyperlipidemia, unspecified: Secondary | ICD-10-CM | POA: Diagnosis not present

## 2021-11-12 DIAGNOSIS — D696 Thrombocytopenia, unspecified: Secondary | ICD-10-CM | POA: Insufficient documentation

## 2021-11-12 DIAGNOSIS — G4730 Sleep apnea, unspecified: Secondary | ICD-10-CM | POA: Insufficient documentation

## 2021-11-12 DIAGNOSIS — Z5111 Encounter for antineoplastic chemotherapy: Secondary | ICD-10-CM | POA: Diagnosis present

## 2021-11-12 DIAGNOSIS — I700 Atherosclerosis of aorta: Secondary | ICD-10-CM | POA: Diagnosis not present

## 2021-11-12 DIAGNOSIS — C8598 Non-Hodgkin lymphoma, unspecified, lymph nodes of multiple sites: Secondary | ICD-10-CM

## 2021-11-12 MED ORDER — PEGFILGRASTIM-CBQV 6 MG/0.6ML ~~LOC~~ SOSY
6.0000 mg | PREFILLED_SYRINGE | Freq: Once | SUBCUTANEOUS | Status: AC
  Administered 2021-11-12: 6 mg via SUBCUTANEOUS
  Filled 2021-11-12: qty 0.6

## 2021-11-12 NOTE — Patient Instructions (Signed)

## 2021-11-14 ENCOUNTER — Encounter: Payer: Self-pay | Admitting: Oncology

## 2021-11-18 DIAGNOSIS — H5213 Myopia, bilateral: Secondary | ICD-10-CM | POA: Diagnosis not present

## 2021-11-18 DIAGNOSIS — H52203 Unspecified astigmatism, bilateral: Secondary | ICD-10-CM | POA: Diagnosis not present

## 2021-11-18 DIAGNOSIS — H25813 Combined forms of age-related cataract, bilateral: Secondary | ICD-10-CM | POA: Diagnosis not present

## 2021-11-18 DIAGNOSIS — H524 Presbyopia: Secondary | ICD-10-CM | POA: Diagnosis not present

## 2021-11-20 ENCOUNTER — Ambulatory Visit (HOSPITAL_COMMUNITY): Payer: Medicare HMO

## 2021-11-21 ENCOUNTER — Ambulatory Visit (HOSPITAL_COMMUNITY)
Admission: RE | Admit: 2021-11-21 | Discharge: 2021-11-21 | Disposition: A | Discharge status: Home / Self Care | Payer: Medicare HMO | Source: Ambulatory Visit | Attending: Nurse Practitioner | Admitting: Nurse Practitioner

## 2021-11-21 ENCOUNTER — Ambulatory Visit (HOSPITAL_BASED_OUTPATIENT_CLINIC_OR_DEPARTMENT_OTHER): Payer: Medicare HMO

## 2021-11-21 DIAGNOSIS — C8590 Non-Hodgkin lymphoma, unspecified, unspecified site: Secondary | ICD-10-CM | POA: Diagnosis not present

## 2021-11-21 DIAGNOSIS — C8598 Non-Hodgkin lymphoma, unspecified, lymph nodes of multiple sites: Secondary | ICD-10-CM | POA: Insufficient documentation

## 2021-11-21 DIAGNOSIS — R011 Cardiac murmur, unspecified: Secondary | ICD-10-CM | POA: Insufficient documentation

## 2021-11-21 LAB — ECHOCARDIOGRAM COMPLETE
Area-P 1/2: 2.62 cm2
MV M vel: 4.82 m/s
MV Peak grad: 92.9 mmHg
S' Lateral: 2.7 cm

## 2021-11-21 LAB — GLUCOSE, CAPILLARY: Glucose-Capillary: 94 mg/dL (ref 70–99)

## 2021-11-21 MED ORDER — FLUDEOXYGLUCOSE F - 18 (FDG) INJECTION
10.1000 | Freq: Once | INTRAVENOUS | Status: AC
  Administered 2021-11-21: 10.07 via INTRAVENOUS

## 2021-11-30 ENCOUNTER — Other Ambulatory Visit: Payer: Self-pay | Admitting: Oncology

## 2021-11-30 DIAGNOSIS — C8598 Non-Hodgkin lymphoma, unspecified, lymph nodes of multiple sites: Secondary | ICD-10-CM

## 2021-12-01 ENCOUNTER — Inpatient Hospital Stay: Payer: Medicare HMO

## 2021-12-01 ENCOUNTER — Inpatient Hospital Stay (HOSPITAL_BASED_OUTPATIENT_CLINIC_OR_DEPARTMENT_OTHER): Payer: Medicare HMO | Admitting: Oncology

## 2021-12-01 DIAGNOSIS — D539 Nutritional anemia, unspecified: Secondary | ICD-10-CM | POA: Diagnosis not present

## 2021-12-01 DIAGNOSIS — R161 Splenomegaly, not elsewhere classified: Secondary | ICD-10-CM | POA: Diagnosis not present

## 2021-12-01 DIAGNOSIS — Z5189 Encounter for other specified aftercare: Secondary | ICD-10-CM | POA: Diagnosis not present

## 2021-12-01 DIAGNOSIS — C8598 Non-Hodgkin lymphoma, unspecified, lymph nodes of multiple sites: Secondary | ICD-10-CM

## 2021-12-01 DIAGNOSIS — R0609 Other forms of dyspnea: Secondary | ICD-10-CM | POA: Diagnosis not present

## 2021-12-01 DIAGNOSIS — R2681 Unsteadiness on feet: Secondary | ICD-10-CM | POA: Diagnosis not present

## 2021-12-01 DIAGNOSIS — C8338 Diffuse large B-cell lymphoma, lymph nodes of multiple sites: Secondary | ICD-10-CM | POA: Diagnosis not present

## 2021-12-01 DIAGNOSIS — D696 Thrombocytopenia, unspecified: Secondary | ICD-10-CM | POA: Diagnosis not present

## 2021-12-01 DIAGNOSIS — R5383 Other fatigue: Secondary | ICD-10-CM | POA: Diagnosis not present

## 2021-12-01 DIAGNOSIS — R918 Other nonspecific abnormal finding of lung field: Secondary | ICD-10-CM | POA: Diagnosis not present

## 2021-12-01 LAB — CBC WITH DIFFERENTIAL (CANCER CENTER ONLY)
Abs Immature Granulocytes: 0.01 10*3/uL (ref 0.00–0.07)
Basophils Absolute: 0.1 10*3/uL (ref 0.0–0.1)
Basophils Relative: 2 %
Eosinophils Absolute: 0.2 10*3/uL (ref 0.0–0.5)
Eosinophils Relative: 5 %
HCT: 34 % — ABNORMAL LOW (ref 36.0–46.0)
Hemoglobin: 10.7 g/dL — ABNORMAL LOW (ref 12.0–15.0)
Immature Granulocytes: 0 %
Lymphocytes Relative: 39 %
Lymphs Abs: 1.2 10*3/uL (ref 0.7–4.0)
MCH: 32.5 pg (ref 26.0–34.0)
MCHC: 31.5 g/dL (ref 30.0–36.0)
MCV: 103.3 fL — ABNORMAL HIGH (ref 80.0–100.0)
Monocytes Absolute: 0.5 10*3/uL (ref 0.1–1.0)
Monocytes Relative: 18 %
Neutro Abs: 1.1 10*3/uL — ABNORMAL LOW (ref 1.7–7.7)
Neutrophils Relative %: 36 %
Platelet Count: 155 10*3/uL (ref 150–400)
RBC: 3.29 MIL/uL — ABNORMAL LOW (ref 3.87–5.11)
RDW: 17.4 % — ABNORMAL HIGH (ref 11.5–15.5)
WBC Count: 3 10*3/uL — ABNORMAL LOW (ref 4.0–10.5)
nRBC: 0 % (ref 0.0–0.2)

## 2021-12-01 LAB — CMP (CANCER CENTER ONLY)
ALT: 13 U/L (ref 0–44)
AST: 17 U/L (ref 15–41)
Albumin: 3.6 g/dL (ref 3.5–5.0)
Alkaline Phosphatase: 57 U/L (ref 38–126)
Anion gap: 8 (ref 5–15)
BUN: 21 mg/dL (ref 8–23)
CO2: 27 mmol/L (ref 22–32)
Calcium: 9.1 mg/dL (ref 8.9–10.3)
Chloride: 108 mmol/L (ref 98–111)
Creatinine: 1.25 mg/dL — ABNORMAL HIGH (ref 0.44–1.00)
GFR, Estimated: 45 mL/min — ABNORMAL LOW (ref >60)
Glucose, Bld: 181 mg/dL — ABNORMAL HIGH (ref 70–99)
Potassium: 3.7 mmol/L (ref 3.5–5.1)
Sodium: 143 mmol/L (ref 135–145)
Total Bilirubin: 0.4 mg/dL (ref 0.3–1.2)
Total Protein: 5.6 g/dL — ABNORMAL LOW (ref 6.5–8.1)

## 2021-12-01 LAB — LACTATE DEHYDROGENASE: LDH: 195 U/L — ABNORMAL HIGH (ref 98–192)

## 2021-12-01 MED ORDER — SODIUM CHLORIDE 0.9% FLUSH
10.0000 mL | INTRAVENOUS | Status: AC | PRN
  Administered 2021-12-01: 10 mL via INTRAVENOUS

## 2021-12-01 MED ORDER — HEPARIN SOD (PORK) LOCK FLUSH 100 UNIT/ML IV SOLN
500.0000 [IU] | Freq: Once | INTRAVENOUS | Status: AC
  Administered 2021-12-01: 500 [IU] via INTRAVENOUS

## 2021-12-01 MED ORDER — INFLUENZA VAC A&B SA ADJ QUAD 0.5 ML IM PRSY: 0.5000 mL | PREFILLED_SYRINGE | Freq: Once | INTRAMUSCULAR | Status: DC

## 2021-12-01 NOTE — Progress Notes (Signed)
[facility] OFFICE PROGRESS NOTE   Diagnosis: Non-Hodgkin's lymphoma  INTERVAL HISTORY:   [name] [name] returns as scheduled.  She completed cycle 4 R-CHOP on 11/10/2021.  No nausea/vomiting, sweats, rash, or neuropathy symptoms.  She has mild swelling in the left lower leg.  Good appetite.  Objective:  Vital signs in last 24 hours:  Blood pressure (!) 143/55, pulse 62, temperature 98.2 F (36.8 C), temperature source Temporal, resp. rate 16, weight 207 lb 12.8 oz (94.3 kg), SpO2 100 %.    HEENT: No thrush or ulcers Lymphatics: 3-4 cm firm area in the left groin Resp: Lungs clear bilaterally Cardio: Regular rate and rhythm GI: No hepatosplenomegaly, no mass, nontender Vascular: Ace edema to left greater than right lower leg    Portacath/PICC-without erythema  Lab Results:  Lab Results  Component Value Date   WBC 3.0 (L) 12/01/2021   HGB 10.7 (L) 12/01/2021   HCT 34.0 (L) 12/01/2021   MCV 103.3 (H) 12/01/2021   PLT 155 12/01/2021   NEUTROABS 1.1 (L) 12/01/2021    CMP  Lab Results  Component Value Date   NA 143 12/01/2021   K 3.7 12/01/2021   CL 108 12/01/2021   CO2 27 12/01/2021   GLUCOSE 181 (H) 12/01/2021   BUN 21 12/01/2021   CREATININE 1.25 (H) 12/01/2021   CALCIUM 9.1 12/01/2021   PROT 5.6 (L) 12/01/2021   ALBUMIN 3.6 12/01/2021   AST 17 12/01/2021   ALT 13 12/01/2021   ALKPHOS 57 12/01/2021   BILITOT 0.4 12/01/2021   GFRNONAA 45 (L) 12/01/2021   GFRAA 46 (L) 11/15/2017     Medications: I have reviewed the patient's current medications.   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  Negative myeloma FISH panel, 46XX;  neotype myeloid disorders profile NGS sequencing-no pathogenic mutations detected in any of the genes on the NGS panel -PNH screen - 08/26/2020 -CTs 02/17/2021-new splenomegaly; diffuse bilateral bronchial wall thickening; clustered groundglass and fine nodular opacity in the dependent right lower lobe.  CAD. -Bone marrow biopsy 08/13/2021-hypercellular bone marrow for age with trilineage hematopoiesis; several atypical lymphoid aggregates present; no increase in blastic cells; flow cytometry without significant T or B-cell abnormalities; normal cytogenetics; "the limited morphologic and immunohistochemical features are atypical and a lymphoproliferative process is not excluded" -PET scan 08/27/2021-widespread hypermetabolic lymphadenopathy in the neck, left axilla, mediastinum, retroperitoneum, and left pelvis.  Hypermetabolic skeletal lesions, generalized bilateral pulmonary hypermetabolism -08/30/2021-1 unit RBCs -Excisional lymph node biopsy left groin 08/30/2021-Large B-cell lymphoma, CD20 positive, CD30 positive; FISH analysis-no evidence of a double/triple hit lymphoma -Cycle 1 R-CHOP 09/05/2021, Granix 09/06/2021 for 7 days -Cycle 2 R-CHOP 09/29/2021, Udenyca -Cycle 3 R-CHOP 10/20/2021, Udenyca -Cycle 4 R-CHOP 11/10/2021, Udenyca -PET 11/21/2021-complete resolution of hypermetabolic lymphadenopathy in some areas and marked decrease in hypermetabolic lymphadenopathy and others, [place] 1-2, diffuse groundglass lung opacity with hypermetabolism has resolved, decrease in splenomegaly -Cycle 5 R-CHOP 12/11/2021 Fatigue Dyspnea on exertion Unsteady gait/balance disorder, followed by Dr. Jaffe CKD Atrial fibrillation Hypertension Sleep apnea Anorexia/weight loss 06/13/2021 skin rash, question vasculitis-course of prednisone initiated; 06/20/2021 marked improvement, rash recurred when she was tapered off of prednisone, prednisone resumed by dermatology History of coagulopathy-most likely related to  malnutrition and apixaban Port-A-Cath placement 09/09/2021      Disposition: [name] [name] has completed   4 cycles of R-CHOP.  There is been marked clinical and radiologic improvement.  The overall PET findings are consistent with a complete clinical response.  I reviewed the PET findings and images with her.  The plan is to complete 2 more cycles of R-CHOP.  She has mild to moderate neutropenia today.  Chemotherapy will be delayed for 1 week.  She will continue to receive G-CSF with chemotherapy.  [name] [name] will be scheduled for an office visit prior to the final cycle of R-CHOP.  Betsy Coder, MD  12/01/2021  9:58 AM

## 2021-12-02 ENCOUNTER — Inpatient Hospital Stay: Payer: Medicare HMO

## 2021-12-09 ENCOUNTER — Other Ambulatory Visit: Payer: Self-pay | Admitting: *Deleted

## 2021-12-09 DIAGNOSIS — C8598 Non-Hodgkin lymphoma, unspecified, lymph nodes of multiple sites: Secondary | ICD-10-CM

## 2021-12-09 NOTE — Progress Notes (Signed)
Added labs for 11/30 tx

## 2021-12-10 ENCOUNTER — Other Ambulatory Visit: Payer: Self-pay | Admitting: Family Medicine

## 2021-12-10 DIAGNOSIS — Z1231 Encounter for screening mammogram for malignant neoplasm of breast: Secondary | ICD-10-CM

## 2021-12-11 ENCOUNTER — Inpatient Hospital Stay: Payer: Medicare HMO

## 2021-12-11 DIAGNOSIS — R161 Splenomegaly, not elsewhere classified: Secondary | ICD-10-CM | POA: Diagnosis not present

## 2021-12-11 DIAGNOSIS — C8338 Diffuse large B-cell lymphoma, lymph nodes of multiple sites: Secondary | ICD-10-CM | POA: Diagnosis not present

## 2021-12-11 DIAGNOSIS — Z5189 Encounter for other specified aftercare: Secondary | ICD-10-CM | POA: Diagnosis not present

## 2021-12-11 DIAGNOSIS — D539 Nutritional anemia, unspecified: Secondary | ICD-10-CM | POA: Diagnosis not present

## 2021-12-11 DIAGNOSIS — R0609 Other forms of dyspnea: Secondary | ICD-10-CM | POA: Diagnosis not present

## 2021-12-11 DIAGNOSIS — C8598 Non-Hodgkin lymphoma, unspecified, lymph nodes of multiple sites: Secondary | ICD-10-CM

## 2021-12-11 DIAGNOSIS — R2681 Unsteadiness on feet: Secondary | ICD-10-CM | POA: Diagnosis not present

## 2021-12-11 DIAGNOSIS — R5383 Other fatigue: Secondary | ICD-10-CM | POA: Diagnosis not present

## 2021-12-11 DIAGNOSIS — D696 Thrombocytopenia, unspecified: Secondary | ICD-10-CM | POA: Diagnosis not present

## 2021-12-11 DIAGNOSIS — Z95828 Presence of other vascular implants and grafts: Secondary | ICD-10-CM

## 2021-12-11 DIAGNOSIS — R918 Other nonspecific abnormal finding of lung field: Secondary | ICD-10-CM | POA: Diagnosis not present

## 2021-12-11 LAB — CMP (CANCER CENTER ONLY)
ALT: 9 U/L (ref 0–44)
AST: 16 U/L (ref 15–41)
Albumin: 3.6 g/dL (ref 3.5–5.0)
Alkaline Phosphatase: 56 U/L (ref 38–126)
Anion gap: 7 (ref 5–15)
BUN: 20 mg/dL (ref 8–23)
CO2: 28 mmol/L (ref 22–32)
Calcium: 8.8 mg/dL — ABNORMAL LOW (ref 8.9–10.3)
Chloride: 106 mmol/L (ref 98–111)
Creatinine: 1.38 mg/dL — ABNORMAL HIGH (ref 0.44–1.00)
GFR, Estimated: 40 mL/min — ABNORMAL LOW (ref >60)
Glucose, Bld: 164 mg/dL — ABNORMAL HIGH (ref 70–99)
Potassium: 3.9 mmol/L (ref 3.5–5.1)
Sodium: 141 mmol/L (ref 135–145)
Total Bilirubin: 0.4 mg/dL (ref 0.3–1.2)
Total Protein: 5.6 g/dL — ABNORMAL LOW (ref 6.5–8.1)

## 2021-12-11 LAB — CBC WITH DIFFERENTIAL (CANCER CENTER ONLY)
Abs Immature Granulocytes: 0.01 10*3/uL (ref 0.00–0.07)
Basophils Absolute: 0.1 10*3/uL (ref 0.0–0.1)
Basophils Relative: 2 %
Eosinophils Absolute: 0.2 10*3/uL (ref 0.0–0.5)
Eosinophils Relative: 8 %
HCT: 34.4 % — ABNORMAL LOW (ref 36.0–46.0)
Hemoglobin: 11.1 g/dL — ABNORMAL LOW (ref 12.0–15.0)
Immature Granulocytes: 0 %
Lymphocytes Relative: 35 %
Lymphs Abs: 1 10*3/uL (ref 0.7–4.0)
MCH: 33.1 pg (ref 26.0–34.0)
MCHC: 32.3 g/dL (ref 30.0–36.0)
MCV: 102.7 fL — ABNORMAL HIGH (ref 80.0–100.0)
Monocytes Absolute: 0.6 10*3/uL (ref 0.1–1.0)
Monocytes Relative: 20 %
Neutro Abs: 1 10*3/uL — ABNORMAL LOW (ref 1.7–7.7)
Neutrophils Relative %: 35 %
Platelet Count: 164 10*3/uL (ref 150–400)
RBC: 3.35 MIL/uL — ABNORMAL LOW (ref 3.87–5.11)
RDW: 15.7 % — ABNORMAL HIGH (ref 11.5–15.5)
WBC Count: 2.9 10*3/uL — ABNORMAL LOW (ref 4.0–10.5)
nRBC: 0 % (ref 0.0–0.2)

## 2021-12-11 LAB — LACTATE DEHYDROGENASE: LDH: 185 U/L (ref 98–192)

## 2021-12-11 MED ORDER — HEPARIN SOD (PORK) LOCK FLUSH 100 UNIT/ML IV SOLN
500.0000 [IU] | Freq: Once | INTRAVENOUS | Status: AC
  Administered 2021-12-11: 500 [IU] via INTRAVENOUS

## 2021-12-11 MED ORDER — SODIUM CHLORIDE 0.9% FLUSH
10.0000 mL | Freq: Once | INTRAVENOUS | Status: AC
  Administered 2021-12-11: 10 mL via INTRAVENOUS

## 2021-12-11 NOTE — Patient Instructions (Signed)
Leukopenia  Leukopenia is a condition in which the body has a low number of white blood cells. White blood cells help the body to fight infections. The number of white blood cells in the body varies from person to person.  There are five types of white blood cells. Two types of cells, called lymphocytes andneutrophils, make up most of the white blood cell count. When lymphocytes are low, the condition is called lymphocytopenia. When neutrophils are low, the condition is called neutropenia. Neutropenia is the most dangerous type of leukopenia because it can lead to dangerous infections. Preventing infection is important if you have leukopenia. What are the causes? This condition is commonly caused by damage to soft tissue inside of the bones (bone marrow), which is where most white blood cells are made. Bone marrow can get damaged by: Medicine or X-ray treatments for cancer (chemotherapy or radiation therapy). Serious infections. Cancer of the white blood cells. Medicines, including: Certain antibiotics. Certain heart medicines. Steroids. Certain medicines used to treat diseases of the immune system (autoimmune diseases), such as rheumatoid arthritis. Leukopenia also happens when white blood cells are destroyed after leaving the bone marrow. This destruction can be caused by: Liver disease. Autoimmune disease. Vitamin B deficiencies. What are the signs or symptoms? In some cases, there are no symptoms. If you have symptoms, they may include having a lot of bacterial infections. This is one of the most common signs of leukopenia, especially severe neutropenia. Different infections have different symptoms. They include: Coughing, if you have an infection in the lungs. Frequent urination and a burning sensation, if you have urinary tract infection. Infections of the blood, skin, rectum, throat, sinuses, or ears. Other symptoms of this condition include: Fever, sweating, or  chills. Fatigue. Swollen glands (lymph nodes). Painful mouth sores. Gum disease. How is this diagnosed? This condition may be diagnosed based on your medical history, physical exam, and tests. During the physical exam, your health care provider will check for swollen lymph nodes and an enlarged spleen. The spleen is an organ on the left side of your body that stores white blood cells. You may also have tests, such as: A complete blood count. This blood test counts each type of white cell. Bone marrow aspiration. Some bone marrow is removed to be checked under a microscope. Lymph node biopsy. Some lymph node tissue is removed to be checked under a microscope. Other types of blood tests or imaging tests. How is this treated? Treatment of leukopenia depends on the cause. Some common treatments include: Taking antibiotic medicine to treat bacterial infections. Stopping any medicines you are taking that may be causing leukopenia. Taking medicines to stimulate neutrophil production to treat neutropenia (hematopoietic growth factors). Follow these instructions at home: Infection prevention Avoid close contact with sick people. Wash your hands often with soap and water for at least 20 seconds. If soap and water are not available, use hand sanitizer. Do not eat uncooked or undercooked meats. Wash fruits and vegetables before eating them. Do not eat or drink unpasteurized dairy products. Get regular dental care, and maintain good dental hygiene. You should visit the dentist at least once every 6 months. Avoid fresh flowers or plants, which may have bacteria in the soil. General instructions Take over-the-counter and prescription medicines only as told by your health care provider. This includes supplements and vitamins. If you were prescribed an antibiotic medicine, take it as told by your health care provider. Do not stop taking the antibiotic even if you start  to feel better. Avoid crowds. If  you need to go to a public place where many people gather, try to visit at a time when it is likely to be less crowded. Ask your health care provider about getting vaccinations, such as the influenza or pneumococcal vaccine. Keep all follow-up visits. This is important. Where to find more information American Cancer Society: www.cancer.org Centers for Disease Control and Prevention: http://www.wolf.info/ Contact a health care provider if you have: A temperature greater than 101F (38.3C), sweating, or chills. Swollen lymph nodes. Fatigue. Painful mouth sores, or gum disease. Cough, stuffy nose, or sore throat. Pain or burning when you urinate, a strong need to urinate, or the need to urinate more often. Get help right away if you have: A temperature greater than 101F (38.3C) that lasts for more than 2 days. Symptoms such as cough, nasal congestion, sore throat, or urinary trouble that last for more than 2 days. Trouble breathing. Chest pain. These symptoms may be an emergency. Get help right away. Call 911. Do not wait to see if the symptoms will go away. Do not drive yourself to the hospital. Summary Leukopenia is a condition in which the body has a low number of white blood cells. White blood cells help the body to fight infections. Treatment of leukopenia depends on the cause. Preventing infection is important if you have leukopenia. Follow instructions for preventing infection. Get help right away if you have a temperature greater than 101F (38.3C) that lasts for more than 2 days. This information is not intended to replace advice given to you by your health care provider. Make sure you discuss any questions you have with your health care provider. Document Revised: 08/04/2020 Document Reviewed: 08/04/2020 Elsevier Patient Education  [place].

## 2021-12-11 NOTE — Progress Notes (Signed)
Patient given verbal instructions on neutropenic precautions.  Patient rescheduled for lab and treatment on 12/18/21.  Patient was given printed calendar with new appointment date and time.

## 2021-12-11 NOTE — Patient Instructions (Signed)

## 2021-12-11 NOTE — Progress Notes (Signed)
Patient here for RCHOP. ANC 1.0. Per Ned Card NP, hold chemo today and schedule for labs and treatment next week.

## 2021-12-12 ENCOUNTER — Inpatient Hospital Stay: Payer: Medicare HMO

## 2021-12-17 ENCOUNTER — Other Ambulatory Visit: Payer: Self-pay | Admitting: *Deleted

## 2021-12-17 DIAGNOSIS — C8598 Non-Hodgkin lymphoma, unspecified, lymph nodes of multiple sites: Secondary | ICD-10-CM

## 2021-12-18 ENCOUNTER — Inpatient Hospital Stay: Payer: Medicare HMO

## 2021-12-18 ENCOUNTER — Inpatient Hospital Stay: Payer: Medicare HMO | Attending: Oncology

## 2021-12-18 VITALS — BP 117/59 | HR 53 | Temp 97.5°F | Resp 18

## 2021-12-18 DIAGNOSIS — I4891 Unspecified atrial fibrillation: Secondary | ICD-10-CM | POA: Diagnosis not present

## 2021-12-18 DIAGNOSIS — R630 Anorexia: Secondary | ICD-10-CM | POA: Insufficient documentation

## 2021-12-18 DIAGNOSIS — Z5189 Encounter for other specified aftercare: Secondary | ICD-10-CM | POA: Diagnosis not present

## 2021-12-18 DIAGNOSIS — R0600 Dyspnea, unspecified: Secondary | ICD-10-CM | POA: Diagnosis not present

## 2021-12-18 DIAGNOSIS — I2510 Atherosclerotic heart disease of native coronary artery without angina pectoris: Secondary | ICD-10-CM | POA: Diagnosis not present

## 2021-12-18 DIAGNOSIS — R634 Abnormal weight loss: Secondary | ICD-10-CM | POA: Diagnosis not present

## 2021-12-18 DIAGNOSIS — Z5112 Encounter for antineoplastic immunotherapy: Secondary | ICD-10-CM | POA: Insufficient documentation

## 2021-12-18 DIAGNOSIS — C8598 Non-Hodgkin lymphoma, unspecified, lymph nodes of multiple sites: Secondary | ICD-10-CM

## 2021-12-18 DIAGNOSIS — Z79899 Other long term (current) drug therapy: Secondary | ICD-10-CM | POA: Insufficient documentation

## 2021-12-18 DIAGNOSIS — R5383 Other fatigue: Secondary | ICD-10-CM | POA: Insufficient documentation

## 2021-12-18 DIAGNOSIS — R600 Localized edema: Secondary | ICD-10-CM | POA: Insufficient documentation

## 2021-12-18 DIAGNOSIS — R2689 Other abnormalities of gait and mobility: Secondary | ICD-10-CM | POA: Insufficient documentation

## 2021-12-18 DIAGNOSIS — N189 Chronic kidney disease, unspecified: Secondary | ICD-10-CM | POA: Diagnosis not present

## 2021-12-18 DIAGNOSIS — Z5111 Encounter for antineoplastic chemotherapy: Secondary | ICD-10-CM | POA: Diagnosis not present

## 2021-12-18 DIAGNOSIS — R0609 Other forms of dyspnea: Secondary | ICD-10-CM | POA: Diagnosis not present

## 2021-12-18 DIAGNOSIS — G4730 Sleep apnea, unspecified: Secondary | ICD-10-CM | POA: Insufficient documentation

## 2021-12-18 DIAGNOSIS — C8330 Diffuse large B-cell lymphoma, unspecified site: Secondary | ICD-10-CM | POA: Diagnosis not present

## 2021-12-18 DIAGNOSIS — R21 Rash and other nonspecific skin eruption: Secondary | ICD-10-CM | POA: Diagnosis not present

## 2021-12-18 DIAGNOSIS — D696 Thrombocytopenia, unspecified: Secondary | ICD-10-CM | POA: Insufficient documentation

## 2021-12-18 DIAGNOSIS — D539 Nutritional anemia, unspecified: Secondary | ICD-10-CM | POA: Insufficient documentation

## 2021-12-18 DIAGNOSIS — I129 Hypertensive chronic kidney disease with stage 1 through stage 4 chronic kidney disease, or unspecified chronic kidney disease: Secondary | ICD-10-CM | POA: Diagnosis not present

## 2021-12-18 DIAGNOSIS — R2681 Unsteadiness on feet: Secondary | ICD-10-CM | POA: Diagnosis not present

## 2021-12-18 LAB — CMP (CANCER CENTER ONLY)
ALT: 13 U/L (ref 0–44)
AST: 21 U/L (ref 15–41)
Albumin: 3.9 g/dL (ref 3.5–5.0)
Alkaline Phosphatase: 66 U/L (ref 38–126)
Anion gap: 11 (ref 5–15)
BUN: 24 mg/dL — ABNORMAL HIGH (ref 8–23)
CO2: 27 mmol/L (ref 22–32)
Calcium: 9.2 mg/dL (ref 8.9–10.3)
Chloride: 104 mmol/L (ref 98–111)
Creatinine: 1.45 mg/dL — ABNORMAL HIGH (ref 0.44–1.00)
GFR, Estimated: 38 mL/min — ABNORMAL LOW (ref >60)
Glucose, Bld: 139 mg/dL — ABNORMAL HIGH (ref 70–99)
Potassium: 4.1 mmol/L (ref 3.5–5.1)
Sodium: 142 mmol/L (ref 135–145)
Total Bilirubin: 0.4 mg/dL (ref 0.3–1.2)
Total Protein: 6.1 g/dL — ABNORMAL LOW (ref 6.5–8.1)

## 2021-12-18 LAB — CBC WITH DIFFERENTIAL (CANCER CENTER ONLY)
Abs Immature Granulocytes: 0.01 10*3/uL (ref 0.00–0.07)
Basophils Absolute: 0.1 10*3/uL (ref 0.0–0.1)
Basophils Relative: 2 %
Eosinophils Absolute: 0.4 10*3/uL (ref 0.0–0.5)
Eosinophils Relative: 10 %
HCT: 36 % (ref 36.0–46.0)
Hemoglobin: 11.6 g/dL — ABNORMAL LOW (ref 12.0–15.0)
Immature Granulocytes: 0 %
Lymphocytes Relative: 34 %
Lymphs Abs: 1.3 10*3/uL (ref 0.7–4.0)
MCH: 32.7 pg (ref 26.0–34.0)
MCHC: 32.2 g/dL (ref 30.0–36.0)
MCV: 101.4 fL — ABNORMAL HIGH (ref 80.0–100.0)
Monocytes Absolute: 0.5 10*3/uL (ref 0.1–1.0)
Monocytes Relative: 14 %
Neutro Abs: 1.5 10*3/uL — ABNORMAL LOW (ref 1.7–7.7)
Neutrophils Relative %: 40 %
Platelet Count: 140 10*3/uL — ABNORMAL LOW (ref 150–400)
RBC: 3.55 MIL/uL — ABNORMAL LOW (ref 3.87–5.11)
RDW: 14.6 % (ref 11.5–15.5)
WBC Count: 3.8 10*3/uL — ABNORMAL LOW (ref 4.0–10.5)
nRBC: 0 % (ref 0.0–0.2)

## 2021-12-18 LAB — LACTATE DEHYDROGENASE: LDH: 192 U/L (ref 98–192)

## 2021-12-18 MED ORDER — SODIUM CHLORIDE 0.9 % IV SOLN
375.0000 mg/m2 | Freq: Once | INTRAVENOUS | Status: AC
  Administered 2021-12-18: 800 mg via INTRAVENOUS
  Filled 2021-12-18: qty 50

## 2021-12-18 MED ORDER — PALONOSETRON HCL INJECTION 0.25 MG/5ML
0.2500 mg | Freq: Once | INTRAVENOUS | Status: AC
  Administered 2021-12-18: 0.25 mg via INTRAVENOUS
  Filled 2021-12-18: qty 5

## 2021-12-18 MED ORDER — DIPHENHYDRAMINE HCL 25 MG PO CAPS
50.0000 mg | ORAL_CAPSULE | Freq: Once | ORAL | Status: AC
  Administered 2021-12-18: 50 mg via ORAL
  Filled 2021-12-18: qty 2

## 2021-12-18 MED ORDER — SODIUM CHLORIDE 0.9 % IV SOLN
150.0000 mg | Freq: Once | INTRAVENOUS | Status: AC
  Administered 2021-12-18: 150 mg via INTRAVENOUS
  Filled 2021-12-18: qty 5

## 2021-12-18 MED ORDER — SODIUM CHLORIDE 0.9 % IV SOLN: Freq: Once | INTRAVENOUS | Status: AC

## 2021-12-18 MED ORDER — FAMOTIDINE IN NACL 20-0.9 MG/50ML-% IV SOLN
20.0000 mg | Freq: Once | INTRAVENOUS | Status: AC
  Administered 2021-12-18: 20 mg via INTRAVENOUS
  Filled 2021-12-18: qty 50

## 2021-12-18 MED ORDER — SODIUM CHLORIDE 0.9 % IV SOLN
10.0000 mg | Freq: Once | INTRAVENOUS | Status: AC
  Administered 2021-12-18: 10 mg via INTRAVENOUS
  Filled 2021-12-18: qty 1

## 2021-12-18 MED ORDER — HEPARIN SOD (PORK) LOCK FLUSH 100 UNIT/ML IV SOLN
500.0000 [IU] | Freq: Once | INTRAVENOUS | Status: AC | PRN
  Administered 2021-12-18: 500 [IU]

## 2021-12-18 MED ORDER — VINCRISTINE SULFATE CHEMO INJECTION 1 MG/ML
1.0000 mg | Freq: Once | INTRAVENOUS | Status: AC
  Administered 2021-12-18: 1 mg via INTRAVENOUS
  Filled 2021-12-18: qty 1

## 2021-12-18 MED ORDER — DOXORUBICIN HCL CHEMO IV INJECTION 2 MG/ML
25.0000 mg/m2 | Freq: Once | INTRAVENOUS | Status: AC
  Administered 2021-12-18: 52 mg via INTRAVENOUS
  Filled 2021-12-18: qty 26

## 2021-12-18 MED ORDER — SODIUM CHLORIDE 0.9 % IV SOLN
1000.0000 mg | Freq: Once | INTRAVENOUS | Status: AC
  Administered 2021-12-18: 1000 mg via INTRAVENOUS
  Filled 2021-12-18: qty 50

## 2021-12-18 MED ORDER — ACETAMINOPHEN 325 MG PO TABS
650.0000 mg | ORAL_TABLET | Freq: Once | ORAL | Status: AC
  Administered 2021-12-18: 650 mg via ORAL
  Filled 2021-12-18: qty 2

## 2021-12-18 MED ORDER — SODIUM CHLORIDE 0.9% FLUSH
10.0000 mL | INTRAVENOUS | Status: DC | PRN
  Administered 2021-12-18: 10 mL

## 2021-12-18 NOTE — Patient Instructions (Signed)
[name] [name]   Discharge Instructions: Thank you for choosing [facility] to provide your oncology and hematology care.   If you have a lab appointment with the [facility], please go directly to the [facility] and check in at the registration area.   Wear comfortable clothing and clothing appropriate for easy access to any Portacath or PICC line.   We strive to give you quality time with your provider. You may need to reschedule your appointment if you arrive late (15 or more minutes).  Arriving late affects you and other patients whose appointments are after yours.  Also, if you miss three or more appointments without notifying the office, you may be dismissed from the clinic at the provider's discretion.      For prescription refill requests, have your pharmacy contact our office and allow 72 hours for refills to be completed.    Today you received the following chemotherapy and/or immunotherapy agents Doxorubicin, Vincristine, Cyclophosphamide, Rituximab      To help prevent nausea and vomiting after your treatment, we encourage you to take your nausea medication as directed.  BELOW ARE SYMPTOMS THAT SHOULD BE REPORTED IMMEDIATELY: *FEVER GREATER THAN 100.4 F (38 C) OR HIGHER *CHILLS OR SWEATING *NAUSEA AND VOMITING THAT IS NOT CONTROLLED WITH YOUR NAUSEA MEDICATION *UNUSUAL SHORTNESS OF BREATH *UNUSUAL BRUISING OR BLEEDING *URINARY PROBLEMS (pain or burning when urinating, or frequent urination) *BOWEL PROBLEMS (unusual diarrhea, constipation, pain near the anus) TENDERNESS IN MOUTH AND THROAT WITH OR WITHOUT PRESENCE OF ULCERS (sore throat, sores in mouth, or a toothache) UNUSUAL RASH, SWELLING OR PAIN  UNUSUAL VAGINAL DISCHARGE OR ITCHING   Items with * indicate a potential emergency and should be followed up as soon as possible or go to the Emergency Department if any problems should occur.  Please show the CHEMOTHERAPY ALERT CARD  or IMMUNOTHERAPY ALERT CARD at check-in to the Emergency Department and triage nurse.  Should you have questions after your visit or need to cancel or reschedule your appointment, please contact [facility]  Dept: [phone]  and follow the prompts.  Office hours are 8:00 a.m. to 4:30 p.m. Monday - Friday. Please note that voicemails left after 4:00 p.m. may not be returned until the following business day.  We are closed weekends and major holidays. You have access to a nurse at all times for urgent questions. Please call the main number to the clinic Dept: [phone] and follow the prompts.   For any non-urgent questions, you may also contact your provider using MyChart. We now offer e-Visits for anyone 18 and older to request care online for non-urgent symptoms. For details visit [email or web address].   Also download the MyChart app! Go to the app store, search "MyChart", open the app, select [facility], and log in with your MyChart username and password.  Masks are optional in the cancer centers. If you would like for your care team to wear a mask while they are taking care of you, please let them know. You may have one support person who is at least 75 years old accompany you for your appointments.  Doxorubicin Injection What is this medication? DOXORUBICIN (dox oh ROO bi sin) treats some types of cancer. It works by slowing down the growth of cancer cells. This medicine may be used for other purposes; ask your health care provider or pharmacist if you have questions. COMMON BRAND NAME(S): Adriamycin, Adriamycin PFS, Adriamycin RDF, Rubex What should  I tell my care team before I take this medication? They need to know if you have any of these conditions: Heart disease History of low blood cell levels caused by a medication Liver disease Recent or ongoing radiation An unusual or allergic reaction to doxorubicin, other medications, foods, dyes, or  preservatives If you or your partner are pregnant or trying to get pregnant Breast-feeding How should I use this medication? This medication is injected into a vein. It is given by your care team in a hospital or clinic setting. Talk to your care team about the use of this medication in children. Special care may be needed. Overdosage: If you think you have taken too much of this medicine contact a poison control center or emergency room at once. NOTE: This medicine is only for you. Do not share this medicine with others. What if I miss a dose? Keep appointments for follow-up doses. It is important not to miss your dose. Call your care team if you are unable to keep an appointment. What may interact with this medication? 6-mercaptopurine Paclitaxel Phenytoin St. John's wort Trastuzumab Verapamil This list may not describe all possible interactions. Give your health care provider a list of all the medicines, herbs, non-prescription drugs, or dietary supplements you use. Also tell them if you smoke, drink alcohol, or use illegal drugs. Some items may interact with your medicine. What should I watch for while using this medication? Your condition will be monitored carefully while you are receiving this medication. You may need blood work while taking this medication. This medication may make you feel generally unwell. This is not uncommon as chemotherapy can affect healthy cells as well as cancer cells. Report any side effects. Continue your course of treatment even though you feel ill unless your care team tells you to stop. There is a maximum amount of this medication you should receive throughout your life. The amount depends on the medical condition being treated and your overall health. Your care team will watch how much of this medication you receive. Tell your care team if you have taken this medication before. Your urine may turn red for a few days after your dose. This is not blood. If  your urine is dark or brown, call your care team. In some cases, you may be given additional medications to help with side effects. Follow all directions for their use. This medication may increase your risk of getting an infection. Call your care team for advice if you get a fever, chills, sore throat, or other symptoms of a cold or flu. Do not treat yourself. Try to avoid being around people who are sick. This medication may increase your risk to bruise or bleed. Call your care team if you notice any unusual bleeding. Talk to your care team about your risk of cancer. You may be more at risk for certain types of cancers if you take this medication. You should make sure that you get enough Coenzyme Q10 while you are taking this medication. Discuss the foods you eat and the vitamins you take with your care team. Talk to your care team if you or your partner may be pregnant. Serious birth defects can occur if you take this medication during pregnancy and for 6 months after the last dose. Contraception is recommended while taking this medication and for 6 months after the last dose. Your care team can help you find the option that works for you. If your partner can get pregnant, use a  condom while taking this medication and for 6 months after the last dose. Do not breastfeed while taking this medication. This medication may cause infertility. Talk to your care team if you are concerned about your fertility. What side effects may I notice from receiving this medication? Side effects that you should report to your care team as soon as possible: Allergic reactions--skin rash, itching, hives, swelling of the face, lips, tongue, or throat Heart failure--shortness of breath, swelling of the ankles, feet, or hands, sudden weight gain, unusual weakness or fatigue Heart rhythm changes--fast or irregular heartbeat, dizziness, feeling faint or lightheaded, chest pain, trouble breathing Infection--fever, chills,  cough, sore throat, wounds that don't heal, pain or trouble when passing urine, general feeling of discomfort or being unwell Low red blood cell level--unusual weakness or fatigue, dizziness, headache, trouble breathing Painful swelling, warmth, or redness of the skin, blisters or sores at the infusion site Unusual bruising or bleeding Side effects that usually do not require medical attention (report to your care team if they continue or are bothersome): Diarrhea Hair loss Nausea Pain, redness, or swelling with sores inside the mouth or throat Red urine This list may not describe all possible side effects. Call your doctor for medical advice about side effects. You may report side effects to FDA at 1-800-FDA-1088. Where should I keep my medication? This medication is given in a hospital or clinic. It will not be stored at home. NOTE: This sheet is a summary. It may not cover all possible information. If you have questions about this medicine, talk to your doctor, pharmacist, or health care provider.  2023 Elsevier/Gold Standard (2021-05-07 00:00:00)  Vincristine Injection What is this medication? VINCRISTINE (vin KRIS teen) treats some types of cancer. It works by slowing down the growth of cancer cells. This medicine may be used for other purposes; ask your health care provider or pharmacist if you have questions. COMMON BRAND NAME(S): Oncovin, Vincasar PFS What should I tell my care team before I take this medication? They need to know if you have any of these conditions: Infection Kidney disease Liver disease Low white blood cell levels Lung disease Nervous system disease, such as Charcot-Marie-Tooth (CMT) Recent or ongoing radiation therapy An unusual or allergic reaction to vincristine, other chemotherapy agents, other medications, foods, dyes, or preservatives Pregnant or trying to get pregnant Breast-feeding How should I use this medication? This medication is infused into  a vein. It is given by your care team in a hospital or clinic setting. Talk to your care team about the use of this medication in children. While it may be given to children for selected conditions, precautions do apply. Overdosage: If you think you have taken too much of this medicine contact a poison control center or emergency room at once. NOTE: This medicine is only for you. Do not share this medicine with others. What if I miss a dose? Keep appointments for follow-up doses. It is important not to miss your dose. Call your care team if you are unable to keep an appointment. What may interact with this medication? Do not take this medication with any of the following: Live virus vaccines This medication may also interact with the following: Medications for fungal infections, such as itraconazole or fluconazole Phenytoin Supplements, such as St. John's wort This list may not describe all possible interactions. Give your health care provider a list of all the medicines, herbs, non-prescription drugs, or dietary supplements you use. Also tell them if you smoke, drink  alcohol, or use illegal drugs. Some items may interact with your medicine. What should I watch for while using this medication? Your condition will be monitored carefully while you are receiving this medication. This medication may make you feel generally unwell. This is not uncommon as chemotherapy can affect healthy cells as well as cancer cells. Report any side effects. Continue your course of treatment even though you feel ill unless your care team tells you to stop. You may need blood work while taking this medication. This medication may increase your risk to bruise or bleed. Call your care team if you notice any unusual bleeding. This medication may increase your risk of getting an infection. Call your care team for advice if you get a fever, chills, sore throat, or other symptoms of a cold or flu. Do not treat yourself. Try  to avoid being around people who are sick. This medication will cause constipation. If you do not have a bowel movement for 3 days, call your care team. Call your care team if you are around anyone with measles, chickenpox, or if you develop sores or blisters that do not heal properly. Be careful brushing or flossing your teeth or using a toothpick because you may get an infection or bleed more easily. If you have any dental work done, tell your dentist you are receiving this medication Talk to your care team if you or your partner wish to become pregnant or think either of you might be pregnant. This medication can cause serious birth defects. This medication may cause infertility. Talk to your care team if you are concerned about your fertility. Talk to your care team before breastfeeding. Changes to your treatment plan may be needed. What side effects may I notice from receiving this medication? Side effects that you should report to your care team as soon as possible: Allergic reactions--skin rash, itching, hives, swelling of the face, lips, tongue, or throat High uric acid level--severe pain, redness, warmth, or swelling in joints, pain or trouble passing urine, pain in the lower back or sides Infection--fever, chills, cough, sore throat, wounds that don't heal, pain or trouble when passing urine, general feeling of discomfort or being unwell Pain, tingling, or numbness in the hands or feet, muscle weakness, change in vision, confusion or trouble speaking, loss of balance or coordination, trouble walking, seizures Painful swelling, warmth, or redness of the skin, blisters or sores at the infusion site Shortness of breath or trouble breathing Side effects that usually do not require medical attention (report to your care team if they continue or are bothersome): Constipation Diarrhea Hair loss Loss of appetite Nausea Stomach cramping Vomiting This list may not describe all possible side  effects. Call your doctor for medical advice about side effects. You may report side effects to FDA at 1-800-FDA-1088. Where should I keep my medication? This medication is given in a hospital or clinic. It will not be stored at home. NOTE: This sheet is a summary. It may not cover all possible information. If you have questions about this medicine, talk to your doctor, pharmacist, or health care provider.  2023 Elsevier/Gold Standard (2021-03-25 00:00:00)  Cyclophosphamide Injection What is this medication? CYCLOPHOSPHAMIDE (sye kloe FOSS fa mide) treats some types of cancer. It works by slowing down the growth of cancer cells. This medicine may be used for other purposes; ask your health care provider or pharmacist if you have questions. COMMON BRAND NAME(S): Cyclophosphamide, Cytoxan, Neosar What should I tell my care team before  I take this medication? They need to know if you have any of these conditions: Heart disease Irregular heartbeat or rhythm Infection Kidney problems Liver disease Low blood cell levels (white cells, platelets, or red blood cells) Lung disease Previous radiation Trouble passing urine An unusual or allergic reaction to cyclophosphamide, other medications, foods, dyes, or preservatives Pregnant or trying to get pregnant Breast-feeding How should I use this medication? This medication is injected into a vein. It is given by your care team in a hospital or clinic setting. Talk to your care team about the use of this medication in children. Special care may be needed. Overdosage: If you think you have taken too much of this medicine contact a poison control center or emergency room at once. NOTE: This medicine is only for you. Do not share this medicine with others. What if I miss a dose? Keep appointments for follow-up doses. It is important not to miss your dose. Call your care team if you are unable to keep an appointment. What may interact with this  medication? Amphotericin B Amiodarone Azathioprine Certain antivirals for HIV or hepatitis Certain medications for blood pressure, such as enalapril, lisinopril, quinapril Cyclosporine Diuretics Etanercept Indomethacin Medications that relax muscles Metronidazole Natalizumab Tamoxifen Warfarin This list may not describe all possible interactions. Give your health care provider a list of all the medicines, herbs, non-prescription drugs, or dietary supplements you use. Also tell them if you smoke, drink alcohol, or use illegal drugs. Some items may interact with your medicine. What should I watch for while using this medication? This medication may make you feel generally unwell. This is not uncommon as chemotherapy can affect healthy cells as well as cancer cells. Report any side effects. Continue your course of treatment even though you feel ill unless your care team tells you to stop. You may need blood work while you are taking this medication. This medication may increase your risk of getting an infection. Call your care team for advice if you get a fever, chills, sore throat, or other symptoms of a cold or flu. Do not treat yourself. Try to avoid being around people who are sick. Avoid taking medications that contain aspirin, acetaminophen, ibuprofen, naproxen, or ketoprofen unless instructed by your care team. These medications may hide a fever. Be careful brushing or flossing your teeth or using a toothpick because you may get an infection or bleed more easily. If you have any dental work done, tell your dentist you are receiving this medication. Drink water or other fluids as directed. Urinate often, even at night. Some products may contain alcohol. Ask your care team if this medication contains alcohol. Be sure to tell all care teams you are taking this medicine. Certain medicines, like metronidazole and disulfiram, can cause an unpleasant reaction when taken with alcohol. The reaction  includes flushing, headache, nausea, vomiting, sweating, and increased thirst. The reaction can last from 30 minutes to several hours. Talk to your care team if you wish to become pregnant or think you might be pregnant. This medication can cause serious birth defects if taken during pregnancy and for 1 year after the last dose. A negative pregnancy test is required before starting this medication. A reliable form of contraception is recommended while taking this medication and for 1 year after the last dose. Talk to your care team about reliable forms of contraception. Do not father a child while taking this medication and for 4 months after the last dose. Use a condom during  this time period. Do not breast-feed while taking this medication or for 1 week after the last dose. This medication may cause infertility. Talk to your care team if you are concerned about your fertility. Talk to your care team about your risk of cancer. You may be more at risk for certain types of cancer if you take this medication. What side effects may I notice from receiving this medication? Side effects that you should report to your care team as soon as possible: Allergic reactions--skin rash, itching, hives, swelling of the face, lips, tongue, or throat Dry cough, shortness of breath or trouble breathing Heart failure--shortness of breath, swelling of the ankles, feet, or hands, sudden weight gain, unusual weakness or fatigue Heart muscle inflammation--unusual weakness or fatigue, shortness of breath, chest pain, fast or irregular heartbeat, dizziness, swelling of the ankles, feet, or hands Heart rhythm changes--fast or irregular heartbeat, dizziness, feeling faint or lightheaded, chest pain, trouble breathing Infection--fever, chills, cough, sore throat, wounds that don't heal, pain or trouble when passing urine, general feeling of discomfort or being unwell Kidney injury--decrease in the amount of urine, swelling of the  ankles, hands, or feet Liver injury--right upper belly pain, loss of appetite, nausea, light-colored stool, dark yellow or brown urine, yellowing skin or eyes, unusual weakness or fatigue Low red blood cell level--unusual weakness or fatigue, dizziness, headache, trouble breathing Low sodium level--muscle weakness, fatigue, dizziness, headache, confusion Red or dark brown urine Unusual bruising or bleeding Side effects that usually do not require medical attention (report to your care team if they continue or are bothersome): Hair loss Irregular menstrual cycles or spotting Loss of appetite Nausea Pain, redness, or swelling with sores inside the mouth or throat Vomiting This list may not describe all possible side effects. Call your doctor for medical advice about side effects. You may report side effects to FDA at 1-800-FDA-1088. Where should I keep my medication? This medication is given in a hospital or clinic. It will not be stored at home. NOTE: This sheet is a summary. It may not cover all possible information. If you have questions about this medicine, talk to your doctor, pharmacist, or health care provider.  2023 Elsevier/Gold Standard (2021-02-18 00:00:00)  Rituximab Injection What is this medication? RITUXIMAB (ri TUX i mab) treats leukemia and lymphoma. It works by blocking a protein that causes cancer cells to grow and multiply. This helps to slow or stop the spread of cancer cells. It may also be used to treat autoimmune conditions, such as arthritis. It works by slowing down an overactive immune system. It is a monoclonal antibody. This medicine may be used for other purposes; ask your health care provider or pharmacist if you have questions. COMMON BRAND NAME(S): RIABNI, Rituxan, RUXIENCE, truxima What should I tell my care team before I take this medication? They need to know if you have any of these conditions: Chest pain Heart disease Immune system problems Infection,  such as chickenpox, cold sores, hepatitis B, herpes Irregular heartbeat or rhythm Kidney disease Low blood counts, such as low white cells, platelets, red cells Lung disease Recent or upcoming vaccine An unusual or allergic reaction to rituximab, other medications, foods, dyes, or preservatives Pregnant or trying to get pregnant Breast-feeding How should I use this medication? This medication is injected into a vein. It is given by a care team in a hospital or clinic setting. A special MedGuide will be given to you before each treatment. Be sure to read this information carefully each  time. Talk to your care team about the use of this medication in children. While this medication may be prescribed for children as young as 6 months for selected conditions, precautions do apply. Overdosage: If you think you have taken too much of this medicine contact a poison control center or emergency room at once. NOTE: This medicine is only for you. Do not share this medicine with others. What if I miss a dose? Keep appointments for follow-up doses. It is important not to miss your dose. Call your care team if you are unable to keep an appointment. What may interact with this medication? Do not take this medication with any of the following: Live vaccines This medication may also interact with the following: Cisplatin This list may not describe all possible interactions. Give your health care provider a list of all the medicines, herbs, non-prescription drugs, or dietary supplements you use. Also tell them if you smoke, drink alcohol, or use illegal drugs. Some items may interact with your medicine. What should I watch for while using this medication? Your condition will be monitored carefully while you are receiving this medication. You may need blood work while taking this medication. This medication can cause serious infusion reactions. To reduce the risk your care team may give you other medications  to take before receiving this one. Be sure to follow the directions from your care team. This medication may increase your risk of getting an infection. Call your care team for advice if you get a fever, chills, sore throat, or other symptoms of a cold or flu. Do not treat yourself. Try to avoid being around people who are sick. Call your care team if you are around anyone with measles, chickenpox, or if you develop sores or blisters that do not heal properly. Avoid taking medications that contain aspirin, acetaminophen, ibuprofen, naproxen, or ketoprofen unless instructed by your care team. These medications may hide a fever. This medication may cause serious skin reactions. They can happen weeks to months after starting the medication. Contact your care team right away if you notice fevers or flu-like symptoms with a rash. The rash may be red or purple and then turn into blisters or peeling of the skin. You may also notice a red rash with swelling of the face, lips, or lymph nodes in your neck or under your arms. In some patients, this medication may cause a serious brain infection that may cause death. If you have any problems seeing, thinking, speaking, walking, or standing, tell your care team right away. If you cannot reach your care team, urgently seek another source of medical care. Talk to your care team if you may be pregnant. Serious birth defects can occur if you take this medication during pregnancy and for 12 months after the last dose. You will need a negative pregnancy test before starting this medication. Contraception is recommended while taking this medication and for 12 months after the last dose. Your care team can help you find the option that works for you. Do not breastfeed while taking this medication and for at least 6 months after the last dose. What side effects may I notice from receiving this medication? Side effects that you should report to your care team as soon as  possible: Allergic reactions or angioedema--skin rash, itching or hives, swelling of the face, eyes, lips, tongue, arms, or legs, trouble swallowing or breathing Bowel blockage--stomach cramping, unable to have a bowel movement or pass gas, loss of appetite, vomiting Dizziness,  loss of balance or coordination, confusion or trouble speaking Heart attack--pain or tightness in the chest, shoulders, arms, or jaw, nausea, shortness of breath, cold or clammy skin, feeling faint or lightheaded Heart rhythm changes--fast or irregular heartbeat, dizziness, feeling faint or lightheaded, chest pain, trouble breathing Infection--fever, chills, cough, sore throat, wounds that don't heal, pain or trouble when passing urine, general feeling of discomfort or being unwell Infusion reactions--chest pain, shortness of breath or trouble breathing, feeling faint or lightheaded Kidney injury--decrease in the amount of urine, swelling of the ankles, hands, or feet Liver injury--right upper belly pain, loss of appetite, nausea, light-colored stool, dark yellow or brown urine, yellowing skin or eyes, unusual weakness or fatigue Redness, blistering, peeling, or loosening of the skin, including inside the mouth Stomach pain that is severe, does not go away, or gets worse Tumor lysis syndrome (TLS)--nausea, vomiting, diarrhea, decrease in the amount of urine, dark urine, unusual weakness or fatigue, confusion, muscle pain or cramps, fast or irregular heartbeat, joint pain Side effects that usually do not require medical attention (report to your care team if they continue or are bothersome): Headache Joint pain Nausea Runny or stuffy nose Unusual weakness or fatigue This list may not describe all possible side effects. Call your doctor for medical advice about side effects. You may report side effects to FDA at 1-800-FDA-1088. Where should I keep my medication? This medication is given in a hospital or clinic. It will not  be stored at home. NOTE: This sheet is a summary. It may not cover all possible information. If you have questions about this medicine, talk to your doctor, pharmacist, or health care provider.  2023 [employer]vier/Gold Standard (2021-05-13 00:00:00)

## 2021-12-18 NOTE — Patient Instructions (Signed)

## 2021-12-19 ENCOUNTER — Inpatient Hospital Stay: Payer: Medicare HMO

## 2021-12-19 VITALS — BP 121/50 | HR 54 | Temp 98.2°F | Resp 18

## 2021-12-19 DIAGNOSIS — Z5111 Encounter for antineoplastic chemotherapy: Secondary | ICD-10-CM | POA: Diagnosis not present

## 2021-12-19 DIAGNOSIS — G4730 Sleep apnea, unspecified: Secondary | ICD-10-CM | POA: Diagnosis not present

## 2021-12-19 DIAGNOSIS — Z5189 Encounter for other specified aftercare: Secondary | ICD-10-CM | POA: Diagnosis not present

## 2021-12-19 DIAGNOSIS — C8330 Diffuse large B-cell lymphoma, unspecified site: Secondary | ICD-10-CM | POA: Diagnosis not present

## 2021-12-19 DIAGNOSIS — C8598 Non-Hodgkin lymphoma, unspecified, lymph nodes of multiple sites: Secondary | ICD-10-CM

## 2021-12-19 DIAGNOSIS — R600 Localized edema: Secondary | ICD-10-CM | POA: Diagnosis not present

## 2021-12-19 DIAGNOSIS — R5383 Other fatigue: Secondary | ICD-10-CM | POA: Diagnosis not present

## 2021-12-19 DIAGNOSIS — Z5112 Encounter for antineoplastic immunotherapy: Secondary | ICD-10-CM | POA: Diagnosis not present

## 2021-12-19 DIAGNOSIS — R0600 Dyspnea, unspecified: Secondary | ICD-10-CM | POA: Diagnosis not present

## 2021-12-19 DIAGNOSIS — R2689 Other abnormalities of gait and mobility: Secondary | ICD-10-CM | POA: Diagnosis not present

## 2021-12-19 MED ORDER — PEGFILGRASTIM-CBQV 6 MG/0.6ML ~~LOC~~ SOSY
6.0000 mg | PREFILLED_SYRINGE | Freq: Once | SUBCUTANEOUS | Status: AC
  Administered 2021-12-19: 6 mg via SUBCUTANEOUS
  Filled 2021-12-19: qty 0.6

## 2021-12-19 NOTE — Patient Instructions (Signed)

## 2021-12-20 ENCOUNTER — Other Ambulatory Visit (HOSPITAL_COMMUNITY): Payer: Self-pay | Admitting: Physician Assistant

## 2021-12-22 ENCOUNTER — Other Ambulatory Visit: Payer: Medicare HMO

## 2021-12-22 ENCOUNTER — Ambulatory Visit: Payer: Medicare HMO

## 2021-12-22 ENCOUNTER — Ambulatory Visit: Payer: Medicare HMO | Admitting: Oncology

## 2021-12-24 ENCOUNTER — Ambulatory Visit: Payer: Medicare HMO

## 2022-01-01 ENCOUNTER — Inpatient Hospital Stay: Payer: Medicare HMO

## 2022-01-01 ENCOUNTER — Inpatient Hospital Stay: Payer: Medicare HMO | Admitting: Oncology

## 2022-01-01 DIAGNOSIS — N1832 Chronic kidney disease, stage 3b: Secondary | ICD-10-CM | POA: Diagnosis not present

## 2022-01-02 ENCOUNTER — Inpatient Hospital Stay: Payer: Medicare HMO

## 2022-01-05 ENCOUNTER — Other Ambulatory Visit: Payer: Self-pay | Admitting: Oncology

## 2022-01-05 DIAGNOSIS — C8598 Non-Hodgkin lymphoma, unspecified, lymph nodes of multiple sites: Secondary | ICD-10-CM

## 2022-01-08 ENCOUNTER — Inpatient Hospital Stay: Payer: Medicare HMO

## 2022-01-08 ENCOUNTER — Inpatient Hospital Stay (HOSPITAL_BASED_OUTPATIENT_CLINIC_OR_DEPARTMENT_OTHER): Payer: Medicare HMO | Admitting: Oncology

## 2022-01-08 ENCOUNTER — Encounter: Payer: Self-pay | Admitting: Cardiology

## 2022-01-08 VITALS — BP 119/61 | HR 78 | Temp 97.7°F | Resp 20

## 2022-01-08 DIAGNOSIS — Z5111 Encounter for antineoplastic chemotherapy: Secondary | ICD-10-CM | POA: Diagnosis not present

## 2022-01-08 DIAGNOSIS — R0600 Dyspnea, unspecified: Secondary | ICD-10-CM | POA: Diagnosis not present

## 2022-01-08 DIAGNOSIS — G4730 Sleep apnea, unspecified: Secondary | ICD-10-CM | POA: Diagnosis not present

## 2022-01-08 DIAGNOSIS — C8598 Non-Hodgkin lymphoma, unspecified, lymph nodes of multiple sites: Secondary | ICD-10-CM

## 2022-01-08 DIAGNOSIS — Z5189 Encounter for other specified aftercare: Secondary | ICD-10-CM | POA: Diagnosis not present

## 2022-01-08 DIAGNOSIS — R2689 Other abnormalities of gait and mobility: Secondary | ICD-10-CM | POA: Diagnosis not present

## 2022-01-08 DIAGNOSIS — R5383 Other fatigue: Secondary | ICD-10-CM | POA: Diagnosis not present

## 2022-01-08 DIAGNOSIS — Z5112 Encounter for antineoplastic immunotherapy: Secondary | ICD-10-CM | POA: Diagnosis not present

## 2022-01-08 DIAGNOSIS — C8330 Diffuse large B-cell lymphoma, unspecified site: Secondary | ICD-10-CM | POA: Diagnosis not present

## 2022-01-08 DIAGNOSIS — R600 Localized edema: Secondary | ICD-10-CM | POA: Diagnosis not present

## 2022-01-08 LAB — CBC WITH DIFFERENTIAL (CANCER CENTER ONLY)
Abs Immature Granulocytes: 0.02 10*3/uL (ref 0.00–0.07)
Basophils Absolute: 0.1 10*3/uL (ref 0.0–0.1)
Basophils Relative: 2 %
Eosinophils Absolute: 0.1 10*3/uL (ref 0.0–0.5)
Eosinophils Relative: 2 %
HCT: 39.5 % (ref 36.0–46.0)
Hemoglobin: 13.1 g/dL (ref 12.0–15.0)
Immature Granulocytes: 1 %
Lymphocytes Relative: 29 %
Lymphs Abs: 1.1 10*3/uL (ref 0.7–4.0)
MCH: 32.1 pg (ref 26.0–34.0)
MCHC: 33.2 g/dL (ref 30.0–36.0)
MCV: 96.8 fL (ref 80.0–100.0)
Monocytes Absolute: 0.5 10*3/uL (ref 0.1–1.0)
Monocytes Relative: 14 %
Neutro Abs: 1.9 10*3/uL (ref 1.7–7.7)
Neutrophils Relative %: 52 %
Platelet Count: 166 10*3/uL (ref 150–400)
RBC: 4.08 MIL/uL (ref 3.87–5.11)
RDW: 13.6 % (ref 11.5–15.5)
WBC Count: 3.7 10*3/uL — ABNORMAL LOW (ref 4.0–10.5)
nRBC: 0 % (ref 0.0–0.2)

## 2022-01-08 LAB — CMP (CANCER CENTER ONLY)
ALT: 13 U/L (ref 0–44)
AST: 17 U/L (ref 15–41)
Albumin: 4 g/dL (ref 3.5–5.0)
Alkaline Phosphatase: 58 U/L (ref 38–126)
Anion gap: 10 (ref 5–15)
BUN: 32 mg/dL — ABNORMAL HIGH (ref 8–23)
CO2: 35 mmol/L — ABNORMAL HIGH (ref 22–32)
Calcium: 9.8 mg/dL (ref 8.9–10.3)
Chloride: 98 mmol/L (ref 98–111)
Creatinine: 1.84 mg/dL — ABNORMAL HIGH (ref 0.44–1.00)
GFR, Estimated: 28 mL/min — ABNORMAL LOW (ref >60)
Glucose, Bld: 179 mg/dL — ABNORMAL HIGH (ref 70–99)
Potassium: 3.1 mmol/L — ABNORMAL LOW (ref 3.5–5.1)
Sodium: 143 mmol/L (ref 135–145)
Total Bilirubin: 0.4 mg/dL (ref 0.3–1.2)
Total Protein: 6.3 g/dL — ABNORMAL LOW (ref 6.5–8.1)

## 2022-01-08 LAB — MAGNESIUM: Magnesium: 1.8 mg/dL (ref 1.7–2.4)

## 2022-01-08 MED ORDER — SODIUM CHLORIDE 0.9 % IV SOLN
150.0000 mg | Freq: Once | INTRAVENOUS | Status: AC
  Administered 2022-01-08: 150 mg via INTRAVENOUS
  Filled 2022-01-08: qty 150

## 2022-01-08 MED ORDER — SODIUM CHLORIDE 0.9 % IV SOLN
375.0000 mg/m2 | Freq: Once | INTRAVENOUS | Status: AC
  Administered 2022-01-08: 800 mg via INTRAVENOUS
  Filled 2022-01-08: qty 30

## 2022-01-08 MED ORDER — DOXORUBICIN HCL CHEMO IV INJECTION 2 MG/ML
25.0000 mg/m2 | Freq: Once | INTRAVENOUS | Status: AC
  Administered 2022-01-08: 52 mg via INTRAVENOUS
  Filled 2022-01-08: qty 26

## 2022-01-08 MED ORDER — DIPHENHYDRAMINE HCL 25 MG PO CAPS
50.0000 mg | ORAL_CAPSULE | Freq: Once | ORAL | Status: AC
  Administered 2022-01-08: 50 mg via ORAL
  Filled 2022-01-08: qty 2

## 2022-01-08 MED ORDER — FAMOTIDINE IN NACL 20-0.9 MG/50ML-% IV SOLN
20.0000 mg | Freq: Once | INTRAVENOUS | Status: AC
  Administered 2022-01-08: 20 mg via INTRAVENOUS
  Filled 2022-01-08: qty 50

## 2022-01-08 MED ORDER — SODIUM CHLORIDE 0.9% FLUSH
10.0000 mL | INTRAVENOUS | Status: DC | PRN
  Administered 2022-01-08: 10 mL

## 2022-01-08 MED ORDER — ACETAMINOPHEN 325 MG PO TABS
650.0000 mg | ORAL_TABLET | Freq: Once | ORAL | Status: AC
  Administered 2022-01-08: 650 mg via ORAL
  Filled 2022-01-08: qty 2

## 2022-01-08 MED ORDER — HEPARIN SOD (PORK) LOCK FLUSH 100 UNIT/ML IV SOLN
500.0000 [IU] | Freq: Once | INTRAVENOUS | Status: AC | PRN
  Administered 2022-01-08: 500 [IU]

## 2022-01-08 MED ORDER — SODIUM CHLORIDE 0.9 % IV SOLN
490.0000 mg/m2 | Freq: Once | INTRAVENOUS | Status: AC
  Administered 2022-01-08: 1000 mg via INTRAVENOUS
  Filled 2022-01-08: qty 50

## 2022-01-08 MED ORDER — SODIUM CHLORIDE 0.9 % IV SOLN: Freq: Once | INTRAVENOUS | Status: AC

## 2022-01-08 MED ORDER — SODIUM CHLORIDE 0.9 % IV SOLN
10.0000 mg | Freq: Once | INTRAVENOUS | Status: AC
  Administered 2022-01-08: 10 mg via INTRAVENOUS
  Filled 2022-01-08: qty 10

## 2022-01-08 MED ORDER — VINCRISTINE SULFATE CHEMO INJECTION 1 MG/ML
1.0000 mg | Freq: Once | INTRAVENOUS | Status: AC
  Administered 2022-01-08: 1 mg via INTRAVENOUS
  Filled 2022-01-08: qty 1

## 2022-01-08 MED ORDER — PALONOSETRON HCL INJECTION 0.25 MG/5ML
0.2500 mg | Freq: Once | INTRAVENOUS | Status: AC
  Administered 2022-01-08: 0.25 mg via INTRAVENOUS
  Filled 2022-01-08: qty 5

## 2022-01-08 NOTE — Progress Notes (Signed)
Patient seen by Dr. Benay Spice today  Vitals are within treatment parameters.  Labs reviewed by Dr. Benay Spice and are not all within treatment parameters. Creatinine 1.84 and K+ 3.1--OK to treat  Per physician team, patient is ready for treatment and there are NO modifications to the treatment plan.

## 2022-01-08 NOTE — Progress Notes (Signed)
Provided patient copy of labs today and instructed her to take her KCl 20 meq daily due to K+ 3.1. Also instructed her to call her cardiologist and let them know she is in rapid a-fib and should be seen. Need to repeat BMP in 1 week. Sent staff message to Nicholes Rough, NP with her cardiology group.

## 2022-01-08 NOTE — Progress Notes (Signed)
[facility] OFFICE PROGRESS NOTE   Diagnosis: Non-Hodgkin's lymphoma  INTERVAL HISTORY:   Ms. Quraishi completed another cycle of chemotherapy on 12/18/2021.  No nausea/vomiting, mouth sores, or neuropathy symptoms.  No sweats or rash.  She feels well.  She reports leg edema improved with an increased dose of furosemide.  Objective:  Vital signs in last 24 hours:  Blood pressure 124/63, pulse 99, temperature 98.1 F (36.7 C), temperature source Oral, resp. rate 20, height _0  (1.702 m), weight 196 lb 12.8 oz (89.3 kg), SpO2 100 %.    HEENT: No thrush or ulcers Resp: Lungs clear bilaterally Cardio: Irregular GI: No hepatosplenomegaly Vascular: No leg edema    Portacath/PICC-without erythema  Lab Results:  Lab Results  Component Value Date   WBC 3.7 (L) 01/08/2022   HGB 13.1 01/08/2022   HCT 39.5 01/08/2022   MCV 96.8 01/08/2022   PLT 166 01/08/2022   NEUTROABS 1.9 01/08/2022    CMP  Lab Results  Component Value Date   NA 142 12/18/2021   K 4.1 12/18/2021   CL 104 12/18/2021   CO2 27 12/18/2021   GLUCOSE 139 (H) 12/18/2021   BUN 24 (H) 12/18/2021   CREATININE 1.45 (H) 12/18/2021   CALCIUM 9.2 12/18/2021   PROT 6.1 (L) 12/18/2021   ALBUMIN 3.9 12/18/2021   AST 21 12/18/2021   ALT 13 12/18/2021   ALKPHOS 66 12/18/2021   BILITOT 0.4 12/18/2021   GFRNONAA 38 (L) 12/18/2021   GFRAA 46 (L) 11/15/2017    Medications: I have reviewed the patient's current medications.   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  Negative myeloma FISH panel, 46XX; neotype myeloid disorders profile NGS sequencing-no pathogenic mutations detected in  any of the genes on the NGS panel -[facility] screen - 08/26/2020 -CTs 02/17/2021-new splenomegaly; diffuse bilateral bronchial wall thickening; clustered groundglass and fine nodular opacity in the dependent right lower lobe.  CAD. -Bone marrow biopsy 08/13/2021-hypercellular bone marrow for age with trilineage hematopoiesis; several atypical lymphoid aggregates present; no increase in blastic cells; flow cytometry without significant T or B-cell abnormalities; normal cytogenetics; "the limited morphologic and immunohistochemical features are atypical and a lymphoproliferative process is not excluded" -PET scan 0/81/[id number] hypermetabolic lymphadenopathy in the neck, left axilla, mediastinum, retroperitoneum, and left pelvis.  Hypermetabolic skeletal lesions, generalized bilateral pulmonary hypermetabolism -08/30/2021-1 unit RBCs -Excisional lymph node biopsy left groin 08/30/2021-Large B-cell lymphoma, CD20 positive, CD30 positive; FISH analysis-no evidence of a double/triple hit lymphoma -Cycle 1 R-CHOP 09/05/2021, Granix 09/06/2021 for 7 days -Cycle 2 R-CHOP 09/29/2021, Udenyca -Cycle 3 R-CHOP 10/20/2021, Udenyca -Cycle 4 R-CHOP 11/10/2021, Udenyca -PET 11/21/2021-complete resolution of hypermetabolic lymphadenopathy in some areas and marked decrease in hypermetabolic lymphadenopathy and others, [place] 1-2, diffuse groundglass lung opacity with hypermetabolism has resolved, decrease in splenomegaly -Cycle 5 R-CHOP 12/11/2021 -Cycle 6 R-CHOP 01/08/2022 Fatigue Dyspnea on exertion Unsteady gait/balance disorder, followed by Dr. Tomi Likens CKD Atrial fibrillation Hypertension Sleep apnea Anorexia/weight loss 06/13/2021 skin rash, question vasculitis-course of prednisone initiated; 06/20/2021 marked improvement, rash recurred when she was tapered off of prednisone, prednisone resumed by dermatology History of coagulopathy-most likely related to malnutrition and apixaban Port-A-Cath placement 09/09/2021        Disposition: Ms. Sherpa has completed 5 cycles of R-CHOP.  She has tolerated the treatment well.  She is in clinical remission from [place].  She will complete the final planned cycle of chemotherapy today.  Ms. Sax will return for an office and lab visit in 1 month.  We will contact cardiology as she appears to be in atrial fibrillation today.  A recent echocardiogram found the left ventricular ejection fraction at 60 - 65%.  Betsy Coder, MD  01/08/2022  8:31 AM

## 2022-01-08 NOTE — Patient Instructions (Signed)
[facility]   Discharge Instructions: Thank you for choosing [facility] to provide your oncology and hematology care.   If you have a lab appointment with the [facility], please go directly to the [facility] and check in at the registration area.   Wear comfortable clothing and clothing appropriate for easy access to any Portacath or PICC line.   We strive to give you quality time with your provider. You may need to reschedule your appointment if you arrive late (15 or more minutes).  Arriving late affects you and other patients whose appointments are after yours.  Also, if you miss three or more appointments without notifying the office, you may be dismissed from the clinic at the provider's discretion.      For prescription refill requests, have your pharmacy contact our office and allow 72 hours for refills to be completed.    Today you received the following chemotherapy and/or immunotherapy agents Doxorubicin (ADRIAMYCIN), Vincristine (ONCOVIN), Cyclophosphamide (CYTOXAN) & Rituximab-pvvr (RUXIENCE).      To help prevent nausea and vomiting after your treatment, we encourage you to take your nausea medication as directed.  BELOW ARE SYMPTOMS THAT SHOULD BE REPORTED IMMEDIATELY: *FEVER GREATER THAN 100.4 F (38 C) OR HIGHER *CHILLS OR SWEATING *NAUSEA AND VOMITING THAT IS NOT CONTROLLED WITH YOUR NAUSEA MEDICATION *UNUSUAL SHORTNESS OF BREATH *UNUSUAL BRUISING OR BLEEDING *URINARY PROBLEMS (pain or burning when urinating, or frequent urination) *BOWEL PROBLEMS (unusual diarrhea, constipation, pain near the anus) TENDERNESS IN MOUTH AND THROAT WITH OR WITHOUT PRESENCE OF ULCERS (sore throat, sores in mouth, or a toothache) UNUSUAL RASH, SWELLING OR PAIN  UNUSUAL VAGINAL DISCHARGE OR ITCHING   Items with * indicate a potential emergency and should be followed up as soon as possible or go to the Emergency Department if any problems should  occur.  Please show the CHEMOTHERAPY ALERT CARD or IMMUNOTHERAPY ALERT CARD at check-in to the Emergency Department and triage nurse.  Should you have questions after your visit or need to cancel or reschedule your appointment, please contact [facility]  Dept: [phone]  and follow the prompts.  Office hours are 8:00 a.m. to 4:30 p.m. Monday - Friday. Please note that voicemails left after 4:00 p.m. may not be returned until the following business day.  We are closed weekends and major holidays. You have access to a nurse at all times for urgent questions. Please call the main number to the clinic Dept: [phone] and follow the prompts.   For any non-urgent questions, you may also contact your provider using MyChart. We now offer e-Visits for anyone 40 and older to request care online for non-urgent symptoms. For details visit [email or web address].   Also download the MyChart app! Go to the app store, search "MyChart", open the app, select [facility], and log in with your MyChart username and password.  Doxorubicin Injection What is this medication? DOXORUBICIN (dox oh ROO bi sin) treats some types of cancer. It works by slowing down the growth of cancer cells. This medicine may be used for other purposes; ask your health care provider or pharmacist if you have questions. COMMON BRAND NAME(S): Adriamycin, Adriamycin PFS, Adriamycin RDF, Rubex What should I tell my care team before I take this medication? They need to know if you have any of these conditions: Heart disease History of low blood cell levels caused by a medication Liver disease Recent or ongoing radiation An unusual or allergic reaction  to doxorubicin, other medications, foods, dyes, or preservatives If you or your partner are pregnant or trying to get pregnant Breast-feeding How should I use this medication? This medication is injected into a vein. It is given by your care team in a  hospital or clinic setting. Talk to your care team about the use of this medication in children. Special care may be needed. Overdosage: If you think you have taken too much of this medicine contact a poison control center or emergency room at once. NOTE: This medicine is only for you. Do not share this medicine with others. What if I miss a dose? Keep appointments for follow-up doses. It is important not to miss your dose. Call your care team if you are unable to keep an appointment. What may interact with this medication? 6-mercaptopurine Paclitaxel Phenytoin St. John's wort Trastuzumab Verapamil This list may not describe all possible interactions. Give your health care provider a list of all the medicines, herbs, non-prescription drugs, or dietary supplements you use. Also tell them if you smoke, drink alcohol, or use illegal drugs. Some items may interact with your medicine. What should I watch for while using this medication? Your condition will be monitored carefully while you are receiving this medication. You may need blood work while taking this medication. This medication may make you feel generally unwell. This is not uncommon as chemotherapy can affect healthy cells as well as cancer cells. Report any side effects. Continue your course of treatment even though you feel ill unless your care team tells you to stop. There is a maximum amount of this medication you should receive throughout your life. The amount depends on the medical condition being treated and your overall health. Your care team will watch how much of this medication you receive. Tell your care team if you have taken this medication before. Your urine may turn red for a few days after your dose. This is not blood. If your urine is dark or brown, call your care team. In some cases, you may be given additional medications to help with side effects. Follow all directions for their use. This medication may increase your  risk of getting an infection. Call your care team for advice if you get a fever, chills, sore throat, or other symptoms of a cold or flu. Do not treat yourself. Try to avoid being around people who are sick. This medication may increase your risk to bruise or bleed. Call your care team if you notice any unusual bleeding. Talk to your care team about your risk of cancer. You may be more at risk for certain types of cancers if you take this medication. You should make sure that you get enough Coenzyme Q10 while you are taking this medication. Discuss the foods you eat and the vitamins you take with your care team. Talk to your care team if you or your partner may be pregnant. Serious birth defects can occur if you take this medication during pregnancy and for 6 months after the last dose. Contraception is recommended while taking this medication and for 6 months after the last dose. Your care team can help you find the option that works for you. If your partner can get pregnant, use a condom while taking this medication and for 6 months after the last dose. Do not breastfeed while taking this medication. This medication may cause infertility. Talk to your care team if you are concerned about your fertility. What side effects may I notice from  receiving this medication? Side effects that you should report to your care team as soon as possible: Allergic reactions--skin rash, itching, hives, swelling of the face, lips, tongue, or throat Heart failure--shortness of breath, swelling of the ankles, feet, or hands, sudden weight gain, unusual weakness or fatigue Heart rhythm changes--fast or irregular heartbeat, dizziness, feeling faint or lightheaded, chest pain, trouble breathing Infection--fever, chills, cough, sore throat, wounds that don't heal, pain or trouble when passing urine, general feeling of discomfort or being unwell Low red blood cell level--unusual weakness or fatigue, dizziness, headache,  trouble breathing Painful swelling, warmth, or redness of the skin, blisters or sores at the infusion site Unusual bruising or bleeding Side effects that usually do not require medical attention (report to your care team if they continue or are bothersome): Diarrhea Hair loss Nausea Pain, redness, or swelling with sores inside the mouth or throat Red urine This list may not describe all possible side effects. Call your doctor for medical advice about side effects. You may report side effects to FDA at 1-800-FDA-1088. Where should I keep my medication? This medication is given in a hospital or clinic. It will not be stored at home. NOTE: This sheet is a summary. It may not cover all possible information. If you have questions about this medicine, talk to your doctor, pharmacist, or health care provider.  2023 Elsevier/Gold Standard (2021-05-07 00:00:00)  Vincristine Injection What is this medication? VINCRISTINE (vin KRIS teen) treats some types of cancer. It works by slowing down the growth of cancer cells. This medicine may be used for other purposes; ask your health care provider or pharmacist if you have questions. COMMON BRAND NAME(S): Oncovin, Vincasar PFS What should I tell my care team before I take this medication? They need to know if you have any of these conditions: Infection Kidney disease Liver disease Low white blood cell levels Lung disease Nervous system disease, such as Charcot-Marie-Tooth (CMT) Recent or ongoing radiation therapy An unusual or allergic reaction to vincristine, other chemotherapy agents, other medications, foods, dyes, or preservatives Pregnant or trying to get pregnant Breast-feeding How should I use this medication? This medication is infused into a vein. It is given by your care team in a hospital or clinic setting. Talk to your care team about the use of this medication in children. While it may be given to children for selected conditions,  precautions do apply. Overdosage: If you think you have taken too much of this medicine contact a poison control center or emergency room at once. NOTE: This medicine is only for you. Do not share this medicine with others. What if I miss a dose? Keep appointments for follow-up doses. It is important not to miss your dose. Call your care team if you are unable to keep an appointment. What may interact with this medication? Do not take this medication with any of the following: Live virus vaccines This medication may also interact with the following: Medications for fungal infections, such as itraconazole or fluconazole Phenytoin Supplements, such as St. John's wort This list may not describe all possible interactions. Give your health care provider a list of all the medicines, herbs, non-prescription drugs, or dietary supplements you use. Also tell them if you smoke, drink alcohol, or use illegal drugs. Some items may interact with your medicine. What should I watch for while using this medication? Your condition will be monitored carefully while you are receiving this medication. This medication may make you feel generally unwell. This is not  uncommon as chemotherapy can affect healthy cells as well as cancer cells. Report any side effects. Continue your course of treatment even though you feel ill unless your care team tells you to stop. You may need blood work while taking this medication. This medication may increase your risk to bruise or bleed. Call your care team if you notice any unusual bleeding. This medication may increase your risk of getting an infection. Call your care team for advice if you get a fever, chills, sore throat, or other symptoms of a cold or flu. Do not treat yourself. Try to avoid being around people who are sick. This medication will cause constipation. If you do not have a bowel movement for 3 days, call your care team. Call your care team if you are around anyone  with measles, chickenpox, or if you develop sores or blisters that do not heal properly. Be careful brushing or flossing your teeth or using a toothpick because you may get an infection or bleed more easily. If you have any dental work done, tell your dentist you are receiving this medication Talk to your care team if you or your partner wish to become pregnant or think either of you might be pregnant. This medication can cause serious birth defects. This medication may cause infertility. Talk to your care team if you are concerned about your fertility. Talk to your care team before breastfeeding. Changes to your treatment plan may be needed. What side effects may I notice from receiving this medication? Side effects that you should report to your care team as soon as possible: Allergic reactions--skin rash, itching, hives, swelling of the face, lips, tongue, or throat High uric acid level--severe pain, redness, warmth, or swelling in joints, pain or trouble passing urine, pain in the lower back or sides Infection--fever, chills, cough, sore throat, wounds that don't heal, pain or trouble when passing urine, general feeling of discomfort or being unwell Pain, tingling, or numbness in the hands or feet, muscle weakness, change in vision, confusion or trouble speaking, loss of balance or coordination, trouble walking, seizures Painful swelling, warmth, or redness of the skin, blisters or sores at the infusion site Shortness of breath or trouble breathing Side effects that usually do not require medical attention (report to your care team if they continue or are bothersome): Constipation Diarrhea Hair loss Loss of appetite Nausea Stomach cramping Vomiting This list may not describe all possible side effects. Call your doctor for medical advice about side effects. You may report side effects to FDA at 1-800-FDA-1088. Where should I keep my medication? This medication is given in a hospital or  clinic. It will not be stored at home. NOTE: This sheet is a summary. It may not cover all possible information. If you have questions about this medicine, talk to your doctor, pharmacist, or health care provider.  2023 Elsevier/Gold Standard (2021-03-25 00:00:00)  Cyclophosphamide Injection What is this medication? CYCLOPHOSPHAMIDE (sye kloe FOSS fa mide) treats some types of cancer. It works by slowing down the growth of cancer cells. This medicine may be used for other purposes; ask your health care provider or pharmacist if you have questions. COMMON BRAND NAME(S): Cyclophosphamide, Cytoxan, Neosar What should I tell my care team before I take this medication? They need to know if you have any of these conditions: Heart disease Irregular heartbeat or rhythm Infection Kidney problems Liver disease Low blood cell levels (white cells, platelets, or red blood cells) Lung disease Previous radiation Trouble passing urine  An unusual or allergic reaction to cyclophosphamide, other medications, foods, dyes, or preservatives Pregnant or trying to get pregnant Breast-feeding How should I use this medication? This medication is injected into a vein. It is given by your care team in a hospital or clinic setting. Talk to your care team about the use of this medication in children. Special care may be needed. Overdosage: If you think you have taken too much of this medicine contact a poison control center or emergency room at once. NOTE: This medicine is only for you. Do not share this medicine with others. What if I miss a dose? Keep appointments for follow-up doses. It is important not to miss your dose. Call your care team if you are unable to keep an appointment. What may interact with this medication? Amphotericin B Amiodarone Azathioprine Certain antivirals for HIV or hepatitis Certain medications for blood pressure, such as enalapril, lisinopril,  quinapril Cyclosporine Diuretics Etanercept Indomethacin Medications that relax muscles Metronidazole Natalizumab Tamoxifen Warfarin This list may not describe all possible interactions. Give your health care provider a list of all the medicines, herbs, non-prescription drugs, or dietary supplements you use. Also tell them if you smoke, drink alcohol, or use illegal drugs. Some items may interact with your medicine. What should I watch for while using this medication? This medication may make you feel generally unwell. This is not uncommon as chemotherapy can affect healthy cells as well as cancer cells. Report any side effects. Continue your course of treatment even though you feel ill unless your care team tells you to stop. You may need blood work while you are taking this medication. This medication may increase your risk of getting an infection. Call your care team for advice if you get a fever, chills, sore throat, or other symptoms of a cold or flu. Do not treat yourself. Try to avoid being around people who are sick. Avoid taking medications that contain aspirin, acetaminophen, ibuprofen, naproxen, or ketoprofen unless instructed by your care team. These medications may hide a fever. Be careful brushing or flossing your teeth or using a toothpick because you may get an infection or bleed more easily. If you have any dental work done, tell your dentist you are receiving this medication. Drink water or other fluids as directed. Urinate often, even at night. Some products may contain alcohol. Ask your care team if this medication contains alcohol. Be sure to tell all care teams you are taking this medicine. Certain medicines, like metronidazole and disulfiram, can cause an unpleasant reaction when taken with alcohol. The reaction includes flushing, headache, nausea, vomiting, sweating, and increased thirst. The reaction can last from 30 minutes to several hours. Talk to your care team if you  wish to become pregnant or think you might be pregnant. This medication can cause serious birth defects if taken during pregnancy and for 1 year after the last dose. A negative pregnancy test is required before starting this medication. A reliable form of contraception is recommended while taking this medication and for 1 year after the last dose. Talk to your care team about reliable forms of contraception. Do not father a child while taking this medication and for 4 months after the last dose. Use a condom during this time period. Do not breast-feed while taking this medication or for 1 week after the last dose. This medication may cause infertility. Talk to your care team if you are concerned about your fertility. Talk to your care team about your risk of  cancer. You may be more at risk for certain types of cancer if you take this medication. What side effects may I notice from receiving this medication? Side effects that you should report to your care team as soon as possible: Allergic reactions--skin rash, itching, hives, swelling of the face, lips, tongue, or throat Dry cough, shortness of breath or trouble breathing Heart failure--shortness of breath, swelling of the ankles, feet, or hands, sudden weight gain, unusual weakness or fatigue Heart muscle inflammation--unusual weakness or fatigue, shortness of breath, chest pain, fast or irregular heartbeat, dizziness, swelling of the ankles, feet, or hands Heart rhythm changes--fast or irregular heartbeat, dizziness, feeling faint or lightheaded, chest pain, trouble breathing Infection--fever, chills, cough, sore throat, wounds that don't heal, pain or trouble when passing urine, general feeling of discomfort or being unwell Kidney injury--decrease in the amount of urine, swelling of the ankles, hands, or feet Liver injury--right upper belly pain, loss of appetite, nausea, light-colored stool, dark yellow or brown urine, yellowing skin or eyes,  unusual weakness or fatigue Low red blood cell level--unusual weakness or fatigue, dizziness, headache, trouble breathing Low sodium level--muscle weakness, fatigue, dizziness, headache, confusion Red or dark brown urine Unusual bruising or bleeding Side effects that usually do not require medical attention (report to your care team if they continue or are bothersome): Hair loss Irregular menstrual cycles or spotting Loss of appetite Nausea Pain, redness, or swelling with sores inside the mouth or throat Vomiting This list may not describe all possible side effects. Call your doctor for medical advice about side effects. You may report side effects to FDA at 1-800-FDA-1088. Where should I keep my medication? This medication is given in a hospital or clinic. It will not be stored at home. NOTE: This sheet is a summary. It may not cover all possible information. If you have questions about this medicine, talk to your doctor, pharmacist, or health care provider.  2023 Elsevier/Gold Standard (2021-02-18 00:00:00)  Rituximab Injection What is this medication? RITUXIMAB (ri TUX i mab) treats leukemia and lymphoma. It works by blocking a protein that causes cancer cells to grow and multiply. This helps to slow or stop the spread of cancer cells. It may also be used to treat autoimmune conditions, such as arthritis. It works by slowing down an overactive immune system. It is a monoclonal antibody. This medicine may be used for other purposes; ask your health care provider or pharmacist if you have questions. COMMON BRAND NAME(S): RIABNI, Rituxan, RUXIENCE, truxima What should I tell my care team before I take this medication? They need to know if you have any of these conditions: Chest pain Heart disease Immune system problems Infection, such as chickenpox, cold sores, hepatitis B, herpes Irregular heartbeat or rhythm Kidney disease Low blood counts, such as low white cells, platelets, red  cells Lung disease Recent or upcoming vaccine An unusual or allergic reaction to rituximab, other medications, foods, dyes, or preservatives Pregnant or trying to get pregnant Breast-feeding How should I use this medication? This medication is injected into a vein. It is given by a care team in a hospital or clinic setting. A special MedGuide will be given to you before each treatment. Be sure to read this information carefully each time. Talk to your care team about the use of this medication in children. While this medication may be prescribed for children as young as 6 months for selected conditions, precautions do apply. Overdosage: If you think you have taken too much of this  medicine contact a poison control center or emergency room at once. NOTE: This medicine is only for you. Do not share this medicine with others. What if I miss a dose? Keep appointments for follow-up doses. It is important not to miss your dose. Call your care team if you are unable to keep an appointment. What may interact with this medication? Do not take this medication with any of the following: Live vaccines This medication may also interact with the following: Cisplatin This list may not describe all possible interactions. Give your health care provider a list of all the medicines, herbs, non-prescription drugs, or dietary supplements you use. Also tell them if you smoke, drink alcohol, or use illegal drugs. Some items may interact with your medicine. What should I watch for while using this medication? Your condition will be monitored carefully while you are receiving this medication. You may need blood work while taking this medication. This medication can cause serious infusion reactions. To reduce the risk your care team may give you other medications to take before receiving this one. Be sure to follow the directions from your care team. This medication may increase your risk of getting an infection. Call  your care team for advice if you get a fever, chills, sore throat, or other symptoms of a cold or flu. Do not treat yourself. Try to avoid being around people who are sick. Call your care team if you are around anyone with measles, chickenpox, or if you develop sores or blisters that do not heal properly. Avoid taking medications that contain aspirin, acetaminophen, ibuprofen, naproxen, or ketoprofen unless instructed by your care team. These medications may hide a fever. This medication may cause serious skin reactions. They can happen weeks to months after starting the medication. Contact your care team right away if you notice fevers or flu-like symptoms with a rash. The rash may be red or purple and then turn into blisters or peeling of the skin. You may also notice a red rash with swelling of the face, lips, or lymph nodes in your neck or under your arms. In some patients, this medication may cause a serious brain infection that may cause death. If you have any problems seeing, thinking, speaking, walking, or standing, tell your care team right away. If you cannot reach your care team, urgently seek another source of medical care. Talk to your care team if you may be pregnant. Serious birth defects can occur if you take this medication during pregnancy and for 12 months after the last dose. You will need a negative pregnancy test before starting this medication. Contraception is recommended while taking this medication and for 12 months after the last dose. Your care team can help you find the option that works for you. Do not breastfeed while taking this medication and for at least 6 months after the last dose. What side effects may I notice from receiving this medication? Side effects that you should report to your care team as soon as possible: Allergic reactions or angioedema--skin rash, itching or hives, swelling of the face, eyes, lips, tongue, arms, or legs, trouble swallowing or  breathing Bowel blockage--stomach cramping, unable to have a bowel movement or pass gas, loss of appetite, vomiting Dizziness, loss of balance or coordination, confusion or trouble speaking Heart attack--pain or tightness in the chest, shoulders, arms, or jaw, nausea, shortness of breath, cold or clammy skin, feeling faint or lightheaded Heart rhythm changes--fast or irregular heartbeat, dizziness, feeling faint or lightheaded, chest  pain, trouble breathing Infection--fever, chills, cough, sore throat, wounds that don't heal, pain or trouble when passing urine, general feeling of discomfort or being unwell Infusion reactions--chest pain, shortness of breath or trouble breathing, feeling faint or lightheaded Kidney injury--decrease in the amount of urine, swelling of the ankles, hands, or feet Liver injury--right upper belly pain, loss of appetite, nausea, light-colored stool, dark yellow or brown urine, yellowing skin or eyes, unusual weakness or fatigue Redness, blistering, peeling, or loosening of the skin, including inside the mouth Stomach pain that is severe, does not go away, or gets worse Tumor lysis syndrome (TLS)--nausea, vomiting, diarrhea, decrease in the amount of urine, dark urine, unusual weakness or fatigue, confusion, muscle pain or cramps, fast or irregular heartbeat, joint pain Side effects that usually do not require medical attention (report to your care team if they continue or are bothersome): Headache Joint pain Nausea Runny or stuffy nose Unusual weakness or fatigue This list may not describe all possible side effects. Call your doctor for medical advice about side effects. You may report side effects to FDA at 1-800-FDA-1088. Where should I keep my medication? This medication is given in a hospital or clinic. It will not be stored at home. NOTE: This sheet is a summary. It may not cover all possible information. If you have questions about this medicine, talk to your  doctor, pharmacist, or health care provider.  2023 Elsevier/Gold Standard (2021-05-13 00:00:00)

## 2022-01-09 ENCOUNTER — Inpatient Hospital Stay: Payer: Medicare HMO

## 2022-01-09 VITALS — BP 128/75 | HR 109 | Temp 97.9°F | Resp 18

## 2022-01-09 DIAGNOSIS — Z5111 Encounter for antineoplastic chemotherapy: Secondary | ICD-10-CM | POA: Diagnosis not present

## 2022-01-09 DIAGNOSIS — C8330 Diffuse large B-cell lymphoma, unspecified site: Secondary | ICD-10-CM | POA: Diagnosis not present

## 2022-01-09 DIAGNOSIS — C8598 Non-Hodgkin lymphoma, unspecified, lymph nodes of multiple sites: Secondary | ICD-10-CM

## 2022-01-09 DIAGNOSIS — Z5112 Encounter for antineoplastic immunotherapy: Secondary | ICD-10-CM | POA: Diagnosis not present

## 2022-01-09 DIAGNOSIS — R0600 Dyspnea, unspecified: Secondary | ICD-10-CM | POA: Diagnosis not present

## 2022-01-09 DIAGNOSIS — G4730 Sleep apnea, unspecified: Secondary | ICD-10-CM | POA: Diagnosis not present

## 2022-01-09 DIAGNOSIS — R2689 Other abnormalities of gait and mobility: Secondary | ICD-10-CM | POA: Diagnosis not present

## 2022-01-09 DIAGNOSIS — Z5189 Encounter for other specified aftercare: Secondary | ICD-10-CM | POA: Diagnosis not present

## 2022-01-09 DIAGNOSIS — R600 Localized edema: Secondary | ICD-10-CM | POA: Diagnosis not present

## 2022-01-09 DIAGNOSIS — R5383 Other fatigue: Secondary | ICD-10-CM | POA: Diagnosis not present

## 2022-01-09 MED ORDER — PEGFILGRASTIM-CBQV 6 MG/0.6ML ~~LOC~~ SOSY
6.0000 mg | PREFILLED_SYRINGE | Freq: Once | SUBCUTANEOUS | Status: AC
  Administered 2022-01-09: 6 mg via SUBCUTANEOUS
  Filled 2022-01-09: qty 0.6

## 2022-01-09 NOTE — Patient Instructions (Signed)

## 2022-01-09 NOTE — Telephone Encounter (Signed)
Called pt reports HR up to 122 yesterday.  Today highest observed was 116.  Had a visit with the cancer center today and was told heart is in afib.  Pt has a history of atrial fibrillation. Is taking metoprolol succinate 25 mg PO QD, eliquis '5mg'$  PO BID and amiodarone 200 mg PO QD.  Denies missing any doses of these medications.  Denies SOB can not feel when she is in Afib.   BP today 128/75-109.  Advised will send to Afib clinic to follow up.

## 2022-01-14 ENCOUNTER — Encounter: Payer: Self-pay | Admitting: Oncology

## 2022-01-21 ENCOUNTER — Ambulatory Visit (HOSPITAL_COMMUNITY)
Admission: RE | Admit: 2022-01-21 | Discharge: 2022-01-21 | Disposition: A | Discharge status: Home / Self Care | Payer: Medicare HMO | Source: Ambulatory Visit | Attending: Physician Assistant | Admitting: Physician Assistant

## 2022-01-21 ENCOUNTER — Encounter (HOSPITAL_COMMUNITY): Payer: Self-pay | Admitting: Physician Assistant

## 2022-01-21 VITALS — BP 120/82 | HR 117 | Ht 67.0 in | Wt 199.0 lb

## 2022-01-21 DIAGNOSIS — E785 Hyperlipidemia, unspecified: Secondary | ICD-10-CM | POA: Insufficient documentation

## 2022-01-21 DIAGNOSIS — Z7901 Long term (current) use of anticoagulants: Secondary | ICD-10-CM | POA: Diagnosis not present

## 2022-01-21 DIAGNOSIS — D6869 Other thrombophilia: Secondary | ICD-10-CM | POA: Diagnosis not present

## 2022-01-21 DIAGNOSIS — N1832 Chronic kidney disease, stage 3b: Secondary | ICD-10-CM | POA: Diagnosis not present

## 2022-01-21 DIAGNOSIS — I340 Nonrheumatic mitral (valve) insufficiency: Secondary | ICD-10-CM | POA: Insufficient documentation

## 2022-01-21 DIAGNOSIS — G4733 Obstructive sleep apnea (adult) (pediatric): Secondary | ICD-10-CM | POA: Diagnosis not present

## 2022-01-21 DIAGNOSIS — Z8572 Personal history of non-Hodgkin lymphomas: Secondary | ICD-10-CM | POA: Insufficient documentation

## 2022-01-21 DIAGNOSIS — I4819 Other persistent atrial fibrillation: Secondary | ICD-10-CM | POA: Insufficient documentation

## 2022-01-21 DIAGNOSIS — I10 Essential (primary) hypertension: Secondary | ICD-10-CM | POA: Diagnosis not present

## 2022-01-21 DIAGNOSIS — I484 Atypical atrial flutter: Secondary | ICD-10-CM

## 2022-01-21 DIAGNOSIS — N183 Chronic kidney disease, stage 3 unspecified: Secondary | ICD-10-CM | POA: Diagnosis not present

## 2022-01-21 MED ORDER — AMIODARONE HCL 200 MG PO TABS: ORAL_TABLET | ORAL | 0 refills | Status: DC

## 2022-01-21 NOTE — H&P (View-Only) (Signed)
Primary Care Physician: Harlan Stains, MD Primary Cardiologist: Dr Marlou Porch Primary Electrophysiologist: Dr Quentin Ore Referring Physician: Dr Venetia Maxon is a 77 y.o. female with a history of CKD, HTN, HLD, MDS, OSA, non-Hodgkin's lymphoma, atrial fibrillation who presents for follow up in the [facility]. The patient was initially diagnosed with atrial fibrillation remotely and had been maintained on amiodarone. This was discontinued for possible side effects. It was later felt that these symptoms were due to possible MDS and not amiodarone. The medication was resumed 04/18/21. She has had several ED visits for rapid afib requiring DCCV, most recently 04/20/21. She was back in afib the very next day. She has symptoms of fatigue and lightheadedness while in afib. Patient is on Eliquis for a CHADS2VASC score of 6. Patient is s/p DCCV on 05/15/21. She was recently diagnosed with non-Hodgkin's lymphoma and is in remission.   On follow up today, patient reports that her heart rate was noted to be elevated at her oncology visit. She called the AF clinic and her amiodarone was increased to BID. ECG today shows she remains in atrial flutter. She is unaware of her arrhythmia.   Today, she denies symptoms of palpitations, chest pain, shortness of breath, orthopnea, PND, lower extremity edema, presyncope, syncope, bleeding, or neurologic sequela. The patient is tolerating medications without difficulties and is otherwise without complaint today.    Atrial Fibrillation Risk Factors:  she does have symptoms or diagnosis of sleep apnea. she is not compliant with CPAP therapy. she does not have a history of rheumatic fever.   she has a BMI of Body mass index is 31.17 kg/m.Marland Kitchen Filed Weights   01/21/22 1503  Weight: 90.3 kg    Family History  Problem Relation Age of Onset   Colon cancer Mother    Heart disease Father    Breast cancer Neg Hx    Thyroid disease Neg Hx       Atrial Fibrillation Management history:  Previous antiarrhythmic drugs: amiodarone  Previous cardioversions: 2018 x 2, 03/22/21, 04/07/21, 04/20/21, 05/15/21 Previous ablations: none CHADS2VASC score: 6 Anticoagulation history: Eliquis   Past Medical History:  Diagnosis Date   (HFpEF) heart failure with preserved ejection fraction ([place]) 07/06/2016   A-fib ([place])    paroxysmal   Acquired thrombophilia ([place]) 09/03/2021   Acute bronchitis 06/21/2020   Anemia 08/02/2020   Hgb 13.7-> 10.7 between 10/2019 and 07/2020   Anxiety    Arthritis    Benign neoplasm of colon 12/29/2011   Bilateral carpal tunnel syndrome 11/06/2015   Cataracts, bilateral    immature   Cervical spondylosis without myelopathy 11/06/2015   Chest pain 07/06/2016   Chronic cough    Chronic kidney disease, stage 3 unspecified ([place]) 03/26/2021   Chronic rhinitis 06/16/2017   CKD (chronic kidney disease) stage 3, GFR 30-59 ml/min (HCC)    Depression    Disequilibrium 08/01/2019   Drug-induced hypothyroidism 03/26/2021   Dyspnea on exertion 04/03/2016   PFT 08/02/20-mild obstruction, no resp to BD, mild reduction DLCO   Essential hypertension    Fatigue 07/06/2016   Gait instability 08/01/2019   Gastroesophageal reflux disease without esophagitis    Generalized anxiety disorder 11/25/2016   Generalized weakness 08/28/2021   Hardening of the aorta (main artery of the heart) ([place]) 03/26/2021   Hemorrhoid    History of bronchitis    couple of yrs ago   History of peristent atrial fibrillation    Hoarseness 05/12/2017  Hx of colonic polyps 05/24/2014   Hyperlipidemia    takes Pravastatin daily   Hypothyroidism 04/04/2018   Long term current use of amiodarone 10/19/2017   Loss of appetite 09/03/2021   Mitral regurgitation 02/27/2016   Moderate recurrent major depression ([place]) 09/03/2021   NHL (non-Hodgkin's lymphoma) ([place]) 09/03/2021   OSA (obstructive sleep apnea) 10/19/2017   HST 09/24/2018- AHI 30.5/ hr, desaturation to 76%,  body weight 249.6 lbs Unable to tolerate CPAP, failed twice.   Other specified disorders of bone density and structure, other site 09/03/2021   PAF (paroxysmal atrial fibrillation) ([place]) 02/17/2016   Pancytopenia ([place]) 08/29/2021   Persistent atrial fibrillation ([place]) 10/31/2016   Prediabetes 09/03/2021   Primary localized osteoarthritis of left knee 09/21/2016   Primary osteoarthritis of both first carpometacarpal joints 11/06/2015   Pruritus 10/31/2016   Rectocele 09/03/2021   Rhinitis, allergic 03/19/2017   S/P lumbar laminectomy 04/05/2013   Secondary hypercoagulable state ([place]) 04/28/2021   Sensorineural hearing loss (SNHL) of both ears 08/01/2019   Severe recurrent major depression without psychotic features ([place]) 11/25/2016   Splenomegaly 08/20/2021   Syncope 09/03/2021   Upper airway cough syndrome 03/19/2017   Urinary urgency    Past Surgical History:  Procedure Laterality Date   ABDOMINAL HYSTERECTOMY  1980   partial   ABDOMINAL HYSTERECTOMY     partial   BACK SURGERY     CARDIOVERSION N/A 02/24/2016   Procedure: CARDIOVERSION;  Surgeon: Fay Records, MD;  Location: [facility];  Service: Cardiovascular;  Laterality: N/A;   CARDIOVERSION N/A 03/19/2016   Procedure: CARDIOVERSION;  Surgeon: Fay Records, MD;  Location: [facility];  Service: Cardiovascular;  Laterality: N/A;   CARDIOVERSION N/A 05/15/2021   Procedure: CARDIOVERSION;  Surgeon: Pixie Casino, MD;  Location: [facility];  Service: Cardiovascular;  Laterality: N/A;   [place]     COLONOSCOPY     COLONOSCOPY WITH PROPOFOL  12/29/2011   Procedure: COLONOSCOPY WITH PROPOFOL;  Surgeon: Lear Ng, MD;  Location: [facility];  Service: Endoscopy;  Laterality: N/A;   COLONOSCOPY WITH PROPOFOL N/A 05/24/2014   Procedure: COLONOSCOPY WITH PROPOFOL;  Surgeon: Wilford Corner, MD;  Location: [facility];  Service: Endoscopy;  Laterality: N/A;   ESOPHAGEAL  MANOMETRY N/A 02/22/2017   Procedure: ESOPHAGEAL MANOMETRY (EM);  Surgeon: Wilford Corner, MD;  [facility];  Service: Endoscopy;  Laterality: N/A;   ESOPHAGOGASTRODUODENOSCOPY     FOOT SURGERY     left, right   FRACTURE SURGERY     left leg-knee   HEEL SPUR EXCISION Bilateral    HOT HEMOSTASIS  12/29/2011   Procedure: HOT HEMOSTASIS (ARGON PLASMA COAGULATION/BICAP);  Surgeon: Lear Ng, MD;  Location[facility];  Service: Endoscopy;  Laterality: N/A;   HOT HEMOSTASIS N/A 05/24/2014   Procedure: HOT HEMOSTASIS (ARGON PLASMA COAGULATION/BICAP);  Surgeon: Wilford Corner, MD;  Location[facility];  Service: Endoscopy;  Laterality: N/A;   IR BONE MARROW BIOPSY & ASPIRATION  08/13/2021   IR IMAGING GUIDED PORT INSERTION  09/09/2021   leg surgery d/t break Left    LUMBAR LAMINECTOMY/DECOMPRESSION MICRODISCECTOMY Left 04/05/2013   Procedure: LUMBAR FOUR TO FIVE LUMBAR LAMINECTOMY/DECOMPRESSION MICRODISCECTOMY 1 LEVEL;  Surgeon: Eustace Moore, MD;  Location: [facility];  Service: Neurosurgery;  Laterality: Left;   LYMPH NODE BIOPSY Left 08/30/2021   Procedure: LYMPH NODE BIOPSY;  Surgeon: Jovita Kussmaul, MD;  Location: [facility];  Service: General;  Laterality: Left;   NISSEN FUNDOPLICATION     [place] IMPEDANCE STUDY N/A 02/22/2017   Procedure: [place] IMPEDANCE STUDY;  Surgeon: Wilford Corner, MD;  [facility];  Service: Endoscopy;  Laterality: N/A;   TEE WITHOUT CARDIOVERSION N/A 02/24/2016   Procedure: TRANSESOPHAGEAL ECHOCARDIOGRAM (TEE);  Surgeon: Fay Records, MD;  Location: [facility];  Service: Cardiovascular;  Laterality: N/A;   TOTAL KNEE ARTHROPLASTY Left 10/07/2016   TOTAL KNEE ARTHROPLASTY Left 10/07/2016   Procedure: TOTAL KNEE ARTHROPLASTY;  Surgeon: [name], MD;  Location: [facility];  Service: Orthopedics;  Laterality: Left;    Current Outpatient Medications  Medication Sig Dispense Refill   acetaminophen (TYLENOL) 500 MG tablet Take 1,000 mg by  mouth every 6 (six) hours as needed for moderate pain.     albuterol (VENTOLIN HFA) 108 (90 Base) MCG/ACT inhaler Inhale 1-2 puffs into the lungs every 4 (four) hours as needed for wheezing or shortness of breath. 1 g 1   ALPRAZolam (XANAX) 0.5 MG tablet Take 1 tablet (0.5 mg total) by mouth 3 (three) times daily as needed for anxiety. 40 tablet 0   apixaban (ELIQUIS) 5 MG TABS tablet Take 1 tablet (5 mg total) by mouth 2 (two) times daily.     benzonatate (TESSALON) 200 MG capsule Take 1 capsule by mouth three times daily as needed for cough 60 capsule 0   buPROPion (WELLBUTRIN XL) 300 MG 24 hr tablet Take 150 mg by mouth daily.     busPIRone (BUSPAR) 10 MG tablet Take 10 mg by mouth 2 (two) times daily.     diphenhydrAMINE (BENADRYL) 50 MG tablet Take 1 tablet (50 mg total) by mouth at bedtime as needed for itching.  0   famotidine (PEPCID) 20 MG tablet Take 20 mg by mouth daily as needed for heartburn or indigestion.     fexofenadine (ALLEGRA) 180 MG tablet Take 180 mg by mouth daily.     furosemide (LASIX) 20 MG tablet Take 1 tablet (20 mg total) by mouth daily as needed for edema or fluid. 90 tablet 3   levothyroxine (SYNTHROID) 75 MCG tablet TAKE 1 TABLET DAILY BEFORE BREAKFAST (NEED APPOINTMENT FOR FURTHER REFILLS) 90 tablet 0   lidocaine-prilocaine (EMLA) cream Apply to Port-A-Cath site 1 to 2 hours prior to use 30 g 3   Magnesium 250 MG TABS Take 250 mg by mouth daily.     metolazone (ZAROXOLYN) 2.5 MG tablet Take 2.5 mg by mouth every other day.     metoprolol succinate (TOPROL-XL) 25 MG 24 hr tablet Take 1 tablet (25 mg total) by mouth daily. 90 tablet 3   mirtazapine (REMERON) 30 MG tablet Take 30 mg by mouth at bedtime.     polyethylene glycol (MIRALAX) 17 g packet Take 17 g by mouth daily. 14 each 0   potassium chloride SA (KLOR-CON M) 20 MEQ tablet Take 1 tablet (20 mEq total) by mouth daily as needed (Take potassium only when you take the Furosemide (Lasix)). 90 tablet 2    pravastatin (PRAVACHOL) 40 MG tablet Take 40 mg by mouth at bedtime.     predniSONE (DELTASONE) 20 MG tablet Take 3 tablets (60 mg total) by mouth daily with breakfast. Take 60 mg daily x 4 days, beginning day after each chemo treatment. 12 tablet 4   sodium chloride (OCEAN) 0.65 % SOLN nasal spray Place 1 spray into both nostrils daily as needed for congestion.     Spacer/Aero-Holding Chambers (AEROCHAMBER MV) inhaler Use as instructed  1 each 0   amiodarone (PACERONE) 200 MG tablet Take 1 tablet by mouth twice a day for 30 days then reduce to once a day 120 tablet 0   No current facility-administered medications for this encounter.   Facility-Administered Medications Ordered in Other Encounters  Medication Dose Route Frequency Provider Last Rate Last Admin   sodium chloride flush (NS) 0.9 % injection 10 mL  10 mL Intravenous PRN Ladell Pier, MD   10 mL at 12/01/21 5916    Allergies  Allergen Reactions   Omnicef [Cefdinir] Swelling    Tongue swelling   Rituxan [Rituximab] Shortness Of Breath    Patient given Pepcid '20mg'$  IV with fluids patinet reported feeling better. Treatment was restarted patient was able to tolerate the rest of the treatment with no issues.    Bacitracin     Rash   Bacitracin-Polymyxin B     Rash   Duloxetine Hcl     felt funny- thinking messed up   Lexapro [Escitalopram]     Makes her feel funny and sleepy   Mirtazapine     weight gain in high doses    Penicillins Swelling    Tongue swell   Peroxide [Hydrogen Peroxide] Other (See Comments)    Redness.    Zoloft [Sertraline Hcl]     Makes her feel funny   Neosporin [Neomycin-Bacitracin Zn-Polymyx] Rash    Blisters, itching.     Social History   Socioeconomic History   Marital status: Married    Spouse name: Dominica Severin   Number of children: 2   Years of education: 12   Highest education level: Some college, no degree  Occupational History   Occupation: retired  Tobacco Use   Smoking status: Never    Smokeless tobacco: Never   Tobacco comments:    Never smoke 05/22/21  Vaping Use   Vaping Use: Never used  Substance and Sexual Activity   Alcohol use: No   Drug use: No   Sexual activity: Yes    Birth control/protection: Surgical  Other Topics Concern   Not on file  Social History Narrative   Right handed   Drinks caffeine   Two story home   Social Determinants of Health   Financial Resource Strain: Low Risk  (11/25/2016)   Overall Financial Resource Strain (CARDIA)    Difficulty of Paying Living Expenses: Not very hard  Food Insecurity: Unknown (11/25/2016)   Hunger Vital Sign    Worried About Running Out of Food in the Last Year: Patient refused    [place] in the Last Year: Patient refused  Transportation Needs: Unknown (11/25/2016)   PRAPARE - Transportation    Lack of Transportation (Medical): Patient refused    Lack of Transportation (Non-Medical): Patient refused  Physical Activity: Unknown (11/25/2016)   Exercise Vital Sign    Days of Exercise per Week: Patient refused    Minutes of Exercise per Session: Patient refused  Stress: Stress Concern Present (11/25/2016)   [place]    Feeling of Stress : Very much  Social Connections: Moderately Integrated (11/25/2016)   Social Connection and Isolation Panel [NHANES]    Frequency of Communication with Friends and Family: Twice a week    Frequency of Social Gatherings with Friends and Family: Twice a week    Attends Religious Services: More than 4 times per year    Active Member of [employer] or Organizations: No    Attends Club or  Organization Meetings: Never    Marital Status: Married  Human resources officer Violence: Not At Risk (11/25/2016)   Humiliation, Afraid, Rape, and Kick questionnaire    Fear of Current or Ex-Partner: No    Emotionally Abused: No    Physically Abused: No    Sexually Abused: No     ROS- All systems are reviewed and  negative except as per the HPI above.  Physical Exam: Vitals:   01/21/22 1503  BP: 120/82  Pulse: (!) 117  Weight: 90.3 kg  Height: '5\' 7"'$  (1.702 m)     GEN- The patient is a well appearing elderly female, alert and oriented x 3 today.   HEENT-head normocephalic, atraumatic, sclera clear, conjunctiva pink, hearing intact, trachea midline. Lungs- Clear to ausculation bilaterally, normal work of breathing Heart- irregular rate and rhythm, no murmurs, rubs or gallops  GI- soft, NT, ND, + BS Extremities- no clubbing, cyanosis, or edema MS- no significant deformity or atrophy Skin- no rash or lesion Psych- euthymic mood, full affect Neuro- strength and sensation are intact   Wt Readings from Last 3 Encounters:  01/21/22 90.3 kg  01/08/22 89.3 kg  12/18/21 95.1 kg    EKG today demonstrates  Atypical atrial flutter with variable block Vent. rate 117 BPM PR interval * ms QRS duration 80 ms QT/QTcB 342/477 ms  Echo 08/09/20 demonstrated   1. Left ventricular ejection fraction, by estimation, is 60 to 65%. The  left ventricle has normal function. The left ventricle has no regional  wall motion abnormalities. Left ventricular diastolic parameters were  normal.   2. Right ventricular systolic function is normal. The right ventricular  size is normal.   3. Left atrial size was moderately dilated.   4. The mitral valve is degenerative. Mild mitral valve regurgitation. No  evidence of mitral stenosis. Moderate mitral annular calcification.   5. The aortic valve is normal in structure. Aortic valve regurgitation is  not visualized. No aortic stenosis is present.   6. The inferior vena cava is normal in size with greater than 50%  respiratory variability, suggesting right atrial pressure of 3 mmHg.   Epic records are reviewed at length today  CHA2DS2-VASc Score = 6  The patient's score is based upon: CHF History: 1 HTN History: 1 Diabetes History: 0 Stroke History: 0 Vascular  Disease History: 1 (aortic atherosclerosis) Age Score: 2 Gender Score: 1       ASSESSMENT AND PLAN: 1. Persistent Atrial Fibrillation/atrial flutter The patient's CHA2DS2-VASc score is 6, indicating a 9.7% annual risk of stroke.   We discussed rhythm control options today. Will plan for DCCV if she does not chemically convert on higher dose of amiodarone.  Continue amiodarone 200 mg BID until DCCV, then decrease back down to once daily.  Continue Toprol 25 mg daily Continue Eliquis 5 mg BID  2. Secondary Hypercoagulable State (ICD10:  D68.69) The patient is at significant risk for stroke/thromboembolism based upon her CHA2DS2-VASc Score of 6.  Continue Apixaban (Eliquis). Workup for Watchman implant was deferred after cancer diagnosis. She has follow with Dr Quentin Ore to discuss again.   3. Obstructive sleep apnea Patient not currently on CPAP.  4. HTN Stable, no changes today.   Follow up with Dr Quentin Ore as scheduled.    [facility] [address] [place], [place] [address] [phone] 01/21/2022 4:20 PM

## 2022-01-21 NOTE — Progress Notes (Signed)
Primary Care Physician: Harlan Stains, MD Primary Cardiologist: Dr Marlou Porch Primary Electrophysiologist: Dr Quentin Ore Referring Physician: Dr Venetia Maxon is a 77 y.o. female with a history of CKD, HTN, HLD, MDS, OSA, non-Hodgkin's lymphoma, atrial fibrillation who presents for follow up in the [facility]. The patient was initially diagnosed with atrial fibrillation remotely and had been maintained on amiodarone. This was discontinued for possible side effects. It was later felt that these symptoms were due to possible MDS and not amiodarone. The medication was resumed 04/18/21. She has had several ED visits for rapid afib requiring DCCV, most recently 04/20/21. She was back in afib the very next day. She has symptoms of fatigue and lightheadedness while in afib. Patient is on Eliquis for a CHADS2VASC score of 6. Patient is s/p DCCV on 05/15/21. She was recently diagnosed with non-Hodgkin's lymphoma and is in remission.   On follow up today, patient reports that her heart rate was noted to be elevated at her oncology visit. She called the AF clinic and her amiodarone was increased to BID. ECG today shows she remains in atrial flutter. She is unaware of her arrhythmia.   Today, she denies symptoms of palpitations, chest pain, shortness of breath, orthopnea, PND, lower extremity edema, presyncope, syncope, bleeding, or neurologic sequela. The patient is tolerating medications without difficulties and is otherwise without complaint today.    Atrial Fibrillation Risk Factors:  she does have symptoms or diagnosis of sleep apnea. she is not compliant with CPAP therapy. she does not have a history of rheumatic fever.   she has a BMI of Body mass index is 31.17 kg/m.Marland Kitchen Filed Weights   01/21/22 1503  Weight: 90.3 kg    Family History  Problem Relation Age of Onset   Colon cancer Mother    Heart disease Father    Breast cancer Neg Hx    Thyroid disease Neg Hx       Atrial Fibrillation Management history:  Previous antiarrhythmic drugs: amiodarone  Previous cardioversions: 2018 x 2, 03/22/21, 04/07/21, 04/20/21, 05/15/21 Previous ablations: none CHADS2VASC score: 6 Anticoagulation history: Eliquis   Past Medical History:  Diagnosis Date   (HFpEF) heart failure with preserved ejection fraction ([place]) 07/06/2016   A-fib ([place])    paroxysmal   Acquired thrombophilia ([place]) 09/03/2021   Acute bronchitis 06/21/2020   Anemia 08/02/2020   Hgb 13.7-> 10.7 between 10/2019 and 07/2020   Anxiety    Arthritis    Benign neoplasm of colon 12/29/2011   Bilateral carpal tunnel syndrome 11/06/2015   Cataracts, bilateral    immature   Cervical spondylosis without myelopathy 11/06/2015   Chest pain 07/06/2016   Chronic cough    Chronic kidney disease, stage 3 unspecified ([place]) 03/26/2021   Chronic rhinitis 06/16/2017   CKD (chronic kidney disease) stage 3, GFR 30-59 ml/min (HCC)    Depression    Disequilibrium 08/01/2019   Drug-induced hypothyroidism 03/26/2021   Dyspnea on exertion 04/03/2016   PFT 08/02/20-mild obstruction, no resp to BD, mild reduction DLCO   Essential hypertension    Fatigue 07/06/2016   Gait instability 08/01/2019   Gastroesophageal reflux disease without esophagitis    Generalized anxiety disorder 11/25/2016   Generalized weakness 08/28/2021   Hardening of the aorta (main artery of the heart) ([place]) 03/26/2021   Hemorrhoid    History of bronchitis    couple of yrs ago   History of peristent atrial fibrillation    Hoarseness 05/12/2017  Hx of colonic polyps 05/24/2014   Hyperlipidemia    takes Pravastatin daily   Hypothyroidism 04/04/2018   Long term current use of amiodarone 10/19/2017   Loss of appetite 09/03/2021   Mitral regurgitation 02/27/2016   Moderate recurrent major depression ([place]) 09/03/2021   NHL (non-Hodgkin's lymphoma) ([place]) 09/03/2021   OSA (obstructive sleep apnea) 10/19/2017   HST 09/24/2018- AHI 30.5/ hr, desaturation to 76%,  body weight 249.6 lbs Unable to tolerate CPAP, failed twice.   Other specified disorders of bone density and structure, other site 09/03/2021   PAF (paroxysmal atrial fibrillation) ([place]) 02/17/2016   Pancytopenia ([place]) 08/29/2021   Persistent atrial fibrillation ([place]) 10/31/2016   Prediabetes 09/03/2021   Primary localized osteoarthritis of left knee 09/21/2016   Primary osteoarthritis of both first carpometacarpal joints 11/06/2015   Pruritus 10/31/2016   Rectocele 09/03/2021   Rhinitis, allergic 03/19/2017   S/P lumbar laminectomy 04/05/2013   Secondary hypercoagulable state ([place]) 04/28/2021   Sensorineural hearing loss (SNHL) of both ears 08/01/2019   Severe recurrent major depression without psychotic features ([place]) 11/25/2016   Splenomegaly 08/20/2021   Syncope 09/03/2021   Upper airway cough syndrome 03/19/2017   Urinary urgency    Past Surgical History:  Procedure Laterality Date   ABDOMINAL HYSTERECTOMY  1980   partial   ABDOMINAL HYSTERECTOMY     partial   BACK SURGERY     CARDIOVERSION N/A 02/24/2016   Procedure: CARDIOVERSION;  Surgeon: Fay Records, MD;  Location: [facility];  Service: Cardiovascular;  Laterality: N/A;   CARDIOVERSION N/A 03/19/2016   Procedure: CARDIOVERSION;  Surgeon: Fay Records, MD;  Location: [facility];  Service: Cardiovascular;  Laterality: N/A;   CARDIOVERSION N/A 05/15/2021   Procedure: CARDIOVERSION;  Surgeon: Pixie Casino, MD;  Location: [facility];  Service: Cardiovascular;  Laterality: N/A;   [place]     COLONOSCOPY     COLONOSCOPY WITH PROPOFOL  12/29/2011   Procedure: COLONOSCOPY WITH PROPOFOL;  Surgeon: Lear Ng, MD;  Location: [facility];  Service: Endoscopy;  Laterality: N/A;   COLONOSCOPY WITH PROPOFOL N/A 05/24/2014   Procedure: COLONOSCOPY WITH PROPOFOL;  Surgeon: Wilford Corner, MD;  Location: [facility];  Service: Endoscopy;  Laterality: N/A;   ESOPHAGEAL  MANOMETRY N/A 02/22/2017   Procedure: ESOPHAGEAL MANOMETRY (EM);  Surgeon: Wilford Corner, MD;  Location: [facility];  Service: Endoscopy;  Laterality: N/A;   ESOPHAGOGASTRODUODENOSCOPY     FOOT SURGERY     left, right   FRACTURE SURGERY     left leg-knee   HEEL SPUR EXCISION Bilateral    HOT HEMOSTASIS  12/29/2011   Procedure: HOT HEMOSTASIS (ARGON PLASMA COAGULATION/BICAP);  Surgeon: Lear Ng, MD;  Location: [facility];  Service: Endoscopy;  Laterality: N/A;   HOT HEMOSTASIS N/A 05/24/2014   Procedure: HOT HEMOSTASIS (ARGON PLASMA COAGULATION/BICAP);  Surgeon: Wilford Corner, MD;  Location: [facility];  Service: Endoscopy;  Laterality: N/A;   IR BONE MARROW BIOPSY & ASPIRATION  08/13/2021   IR IMAGING GUIDED PORT INSERTION  09/09/2021   leg surgery d/t break Left    LUMBAR LAMINECTOMY/DECOMPRESSION MICRODISCECTOMY Left 04/05/2013   Procedure: LUMBAR FOUR TO FIVE LUMBAR LAMINECTOMY/DECOMPRESSION MICRODISCECTOMY 1 LEVEL;  Surgeon: Eustace Moore, MD;  Location: [facility];  Service: Neurosurgery;  Laterality: Left;   LYMPH NODE BIOPSY Left 08/30/2021   Procedure: LYMPH NODE BIOPSY;  Surgeon: Jovita Kussmaul, MD;  Location: [facility];  Service: General;  Laterality: Left;   NISSEN FUNDOPLICATION     [place] IMPEDANCE STUDY N/A 02/22/2017   Procedure: [place] IMPEDANCE STUDY;  Surgeon: Wilford Corner, MD;  Location[facility];  Service: Endoscopy;  Laterality: N/A;   TEE WITHOUT CARDIOVERSION N/A 02/24/2016   Procedure: TRANSESOPHAGEAL ECHOCARDIOGRAM (TEE);  Surgeon: Fay Records, MD;  Location: [facility];  Service: Cardiovascular;  Laterality: N/A;   TOTAL KNEE ARTHROPLASTY Left 10/07/2016   TOTAL KNEE ARTHROPLASTY Left 10/07/2016   Procedure: TOTAL KNEE ARTHROPLASTY;  Surgeon: [name], MD;  Location: [facility];  Service: Orthopedics;  Laterality: Left;    Current Outpatient Medications  Medication Sig Dispense Refill   acetaminophen (TYLENOL) 500 MG tablet Take 1,000 mg by  mouth every 6 (six) hours as needed for moderate pain.     albuterol (VENTOLIN HFA) 108 (90 Base) MCG/ACT inhaler Inhale 1-2 puffs into the lungs every 4 (four) hours as needed for wheezing or shortness of breath. 1 g 1   ALPRAZolam (XANAX) 0.5 MG tablet Take 1 tablet (0.5 mg total) by mouth 3 (three) times daily as needed for anxiety. 40 tablet 0   apixaban (ELIQUIS) 5 MG TABS tablet Take 1 tablet (5 mg total) by mouth 2 (two) times daily.     benzonatate (TESSALON) 200 MG capsule Take 1 capsule by mouth three times daily as needed for cough 60 capsule 0   buPROPion (WELLBUTRIN XL) 300 MG 24 hr tablet Take 150 mg by mouth daily.     busPIRone (BUSPAR) 10 MG tablet Take 10 mg by mouth 2 (two) times daily.     diphenhydrAMINE (BENADRYL) 50 MG tablet Take 1 tablet (50 mg total) by mouth at bedtime as needed for itching.  0   famotidine (PEPCID) 20 MG tablet Take 20 mg by mouth daily as needed for heartburn or indigestion.     fexofenadine (ALLEGRA) 180 MG tablet Take 180 mg by mouth daily.     furosemide (LASIX) 20 MG tablet Take 1 tablet (20 mg total) by mouth daily as needed for edema or fluid. 90 tablet 3   levothyroxine (SYNTHROID) 75 MCG tablet TAKE 1 TABLET DAILY BEFORE BREAKFAST (NEED APPOINTMENT FOR FURTHER REFILLS) 90 tablet 0   lidocaine-prilocaine (EMLA) cream Apply to Port-A-Cath site 1 to 2 hours prior to use 30 g 3   Magnesium 250 MG TABS Take 250 mg by mouth daily.     metolazone (ZAROXOLYN) 2.5 MG tablet Take 2.5 mg by mouth every other day.     metoprolol succinate (TOPROL-XL) 25 MG 24 hr tablet Take 1 tablet (25 mg total) by mouth daily. 90 tablet 3   mirtazapine (REMERON) 30 MG tablet Take 30 mg by mouth at bedtime.     polyethylene glycol (MIRALAX) 17 g packet Take 17 g by mouth daily. 14 each 0   potassium chloride SA (KLOR-CON M) 20 MEQ tablet Take 1 tablet (20 mEq total) by mouth daily as needed (Take potassium only when you take the Furosemide (Lasix)). 90 tablet 2    pravastatin (PRAVACHOL) 40 MG tablet Take 40 mg by mouth at bedtime.     predniSONE (DELTASONE) 20 MG tablet Take 3 tablets (60 mg total) by mouth daily with breakfast. Take 60 mg daily x 4 days, beginning day after each chemo treatment. 12 tablet 4   sodium chloride (OCEAN) 0.65 % SOLN nasal spray Place 1 spray into both nostrils daily as needed for congestion.     Spacer/Aero-Holding Chambers (AEROCHAMBER MV) inhaler Use as instructed  1 each 0   amiodarone (PACERONE) 200 MG tablet Take 1 tablet by mouth twice a day for 30 days then reduce to once a day 120 tablet 0   No current facility-administered medications for this encounter.   Facility-Administered Medications Ordered in Other Encounters  Medication Dose Route Frequency Provider Last Rate Last Admin   sodium chloride flush (NS) 0.9 % injection 10 mL  10 mL Intravenous PRN Ladell Pier, MD   10 mL at 12/01/21 8115    Allergies  Allergen Reactions   Omnicef [Cefdinir] Swelling    Tongue swelling   Rituxan [Rituximab] Shortness Of Breath    Patient given Pepcid '20mg'$  IV with fluids patinet reported feeling better. Treatment was restarted patient was able to tolerate the rest of the treatment with no issues.    Bacitracin     Rash   Bacitracin-Polymyxin B     Rash   Duloxetine Hcl     felt funny- thinking messed up   Lexapro [Escitalopram]     Makes her feel funny and sleepy   Mirtazapine     weight gain in high doses    Penicillins Swelling    Tongue swell   Peroxide [Hydrogen Peroxide] Other (See Comments)    Redness.    Zoloft [Sertraline Hcl]     Makes her feel funny   Neosporin [Neomycin-Bacitracin Zn-Polymyx] Rash    Blisters, itching.     Social History   Socioeconomic History   Marital status: Married    Spouse name: Dominica Severin   Number of children: 2   Years of education: 12   Highest education level: Some college, no degree  Occupational History   Occupation: retired  Tobacco Use   Smoking status: Never    Smokeless tobacco: Never   Tobacco comments:    Never smoke 05/22/21  Vaping Use   Vaping Use: Never used  Substance and Sexual Activity   Alcohol use: No   Drug use: No   Sexual activity: Yes    Birth control/protection: Surgical  Other Topics Concern   Not on file  Social History Narrative   Right handed   Drinks caffeine   Two story home   Social Determinants of Health   Financial Resource Strain: Low Risk  (11/25/2016)   Overall Financial Resource Strain (CARDIA)    Difficulty of Paying Living Expenses: Not very hard  Food Insecurity: Unknown (11/25/2016)   Hunger Vital Sign    Worried About Running Out of Food in the Last Year: Patient refused    [place] in the Last Year: Patient refused  Transportation Needs: Unknown (11/25/2016)   PRAPARE - Transportation    Lack of Transportation (Medical): Patient refused    Lack of Transportation (Non-Medical): Patient refused  Physical Activity: Unknown (11/25/2016)   Exercise Vital Sign    Days of Exercise per Week: Patient refused    Minutes of Exercise per Session: Patient refused  Stress: Stress Concern Present (11/25/2016)   [place]    Feeling of Stress : Very much  Social Connections: Moderately Integrated (11/25/2016)   Social Connection and Isolation Panel [NHANES]    Frequency of Communication with Friends and Family: Twice a week    Frequency of Social Gatherings with Friends and Family: Twice a week    Attends Religious Services: More than 4 times per year    Active Member of [employer] or Organizations: No    Attends Club or  Organization Meetings: Never    Marital Status: Married  Human resources officer Violence: Not At Risk (11/25/2016)   Humiliation, Afraid, Rape, and Kick questionnaire    Fear of Current or Ex-Partner: No    Emotionally Abused: No    Physically Abused: No    Sexually Abused: No     ROS- All systems are reviewed and  negative except as per the HPI above.  Physical Exam: Vitals:   01/21/22 1503  BP: 120/82  Pulse: (!) 117  Weight: 90.3 kg  Height: '5\' 7"'$  (1.702 m)     GEN- The patient is a well appearing elderly female, alert and oriented x 3 today.   HEENT-head normocephalic, atraumatic, sclera clear, conjunctiva pink, hearing intact, trachea midline. Lungs- Clear to ausculation bilaterally, normal work of breathing Heart- irregular rate and rhythm, no murmurs, rubs or gallops  GI- soft, NT, ND, + BS Extremities- no clubbing, cyanosis, or edema MS- no significant deformity or atrophy Skin- no rash or lesion Psych- euthymic mood, full affect Neuro- strength and sensation are intact   Wt Readings from Last 3 Encounters:  01/21/22 90.3 kg  01/08/22 89.3 kg  12/18/21 95.1 kg    EKG today demonstrates  Atypical atrial flutter with variable block Vent. rate 117 BPM PR interval * ms QRS duration 80 ms QT/QTcB 342/477 ms  Echo 08/09/20 demonstrated   1. Left ventricular ejection fraction, by estimation, is 60 to 65%. The  left ventricle has normal function. The left ventricle has no regional  wall motion abnormalities. Left ventricular diastolic parameters were  normal.   2. Right ventricular systolic function is normal. The right ventricular  size is normal.   3. Left atrial size was moderately dilated.   4. The mitral valve is degenerative. Mild mitral valve regurgitation. No  evidence of mitral stenosis. Moderate mitral annular calcification.   5. The aortic valve is normal in structure. Aortic valve regurgitation is  not visualized. No aortic stenosis is present.   6. The inferior vena cava is normal in size with greater than 50%  respiratory variability, suggesting right atrial pressure of 3 mmHg.   Epic records are reviewed at length today  CHA2DS2-VASc Score = 6  The patient's score is based upon: CHF History: 1 HTN History: 1 Diabetes History: 0 Stroke History: 0 Vascular  Disease History: 1 (aortic atherosclerosis) Age Score: 2 Gender Score: 1       ASSESSMENT AND PLAN: 1. Persistent Atrial Fibrillation/atrial flutter The patient's CHA2DS2-VASc score is 6, indicating a 9.7% annual risk of stroke.   We discussed rhythm control options today. Will plan for DCCV if she does not chemically convert on higher dose of amiodarone.  Continue amiodarone 200 mg BID until DCCV, then decrease back down to once daily.  Continue Toprol 25 mg daily Continue Eliquis 5 mg BID  2. Secondary Hypercoagulable State (ICD10:  D68.69) The patient is at significant risk for stroke/thromboembolism based upon her CHA2DS2-VASc Score of 6.  Continue Apixaban (Eliquis). Workup for Watchman implant was deferred after cancer diagnosis. She has follow with Dr Quentin Ore to discuss again.   3. Obstructive sleep apnea Patient not currently on CPAP.  4. HTN Stable, no changes today.   Follow up with Dr Quentin Ore as scheduled.    [facility] [address] [place], [place] [address] [phone] 01/21/2022 4:20 PM

## 2022-01-21 NOTE — Patient Instructions (Signed)
Continue amiodarone to '200mg'$  twice a day until day of cardioversion then reduce to once a day   Cardioversion scheduled for: Friday, January 26th   - Arrive at the [facility] and go to admitting at [facility] not eat or drink anything after midnight the night prior to your procedure.   - Take all your morning medication (except diabetic medications) with a sip of water prior to arrival.  - You will not be able to drive home after your procedure.    - Do NOT miss any doses of your blood thinner - if you should miss a dose please notify our office immediately.   - If you feel as if you go back into normal rhythm prior to scheduled cardioversion, please notify our office immediately.  If your procedure is canceled in the cardioversion suite you will be charged a cancellation fee.   If you are on weekly OZEMPIC, TRULICITY, MOUNJARO, WEGOVY, OR BYDUREON  Hold medication 7 days prior to scheduled procedure/anesthesia.  Restart medication on the normal dosing day after scheduled procedure/anesthesia  If you are on daily BYETTA, VICTOZA, ADLYXIN, OR RYBELSUS:   Hold medication 24 hours prior to scheduled procedure/anesthesia.   Restart medication on the following day after scheduled procedure/anesthesia   For those patients who have a scheduled procedure/anesthesia on the same day of the week as their dose, hold the medication on the day of surgery.  They can take their scheduled dose the week before.  **Patients on the above medications scheduled for elective procedures that have not held the medication for the appropriate amount of time are at risk of cancellation or change in the anesthetic plan.

## 2022-02-05 ENCOUNTER — Ambulatory Visit (HOSPITAL_COMMUNITY)
Admission: RE | Admit: 2022-02-05 | Discharge: 2022-02-05 | Disposition: A | Discharge status: Home / Self Care | Payer: Medicare HMO | Source: Ambulatory Visit | Attending: Physician Assistant | Admitting: Physician Assistant

## 2022-02-05 ENCOUNTER — Ambulatory Visit
Admission: RE | Admit: 2022-02-05 | Discharge: 2022-02-05 | Disposition: A | Discharge status: Home / Self Care | Payer: Medicare HMO | Source: Ambulatory Visit | Attending: Family Medicine | Admitting: Family Medicine

## 2022-02-05 DIAGNOSIS — Z1231 Encounter for screening mammogram for malignant neoplasm of breast: Secondary | ICD-10-CM

## 2022-02-05 DIAGNOSIS — I4819 Other persistent atrial fibrillation: Secondary | ICD-10-CM | POA: Diagnosis not present

## 2022-02-05 LAB — BASIC METABOLIC PANEL
Anion gap: 10 (ref 5–15)
BUN: 14 mg/dL (ref 8–23)
CO2: 26 mmol/L (ref 22–32)
Calcium: 9.5 mg/dL (ref 8.9–10.3)
Chloride: 107 mmol/L (ref 98–111)
Creatinine, Ser: 1.5 mg/dL — ABNORMAL HIGH (ref 0.44–1.00)
GFR, Estimated: 36 mL/min — ABNORMAL LOW (ref >60)
Glucose, Bld: 123 mg/dL — ABNORMAL HIGH (ref 70–99)
Potassium: 4.6 mmol/L (ref 3.5–5.1)
Sodium: 143 mmol/L (ref 135–145)

## 2022-02-05 LAB — CBC
HCT: 38.1 % (ref 36.0–46.0)
Hemoglobin: 12.4 g/dL (ref 12.0–15.0)
MCH: 33.4 pg (ref 26.0–34.0)
MCHC: 32.5 g/dL (ref 30.0–36.0)
MCV: 102.7 fL — ABNORMAL HIGH (ref 80.0–100.0)
Platelets: 136 10*3/uL — ABNORMAL LOW (ref 150–400)
RBC: 3.71 MIL/uL — ABNORMAL LOW (ref 3.87–5.11)
RDW: 15.9 % — ABNORMAL HIGH (ref 11.5–15.5)
WBC: 3.9 10*3/uL — ABNORMAL LOW (ref 4.0–10.5)
nRBC: 0 % (ref 0.0–0.2)

## 2022-02-06 ENCOUNTER — Ambulatory Visit (HOSPITAL_COMMUNITY): Payer: Medicare HMO | Admitting: Anesthesiology

## 2022-02-06 ENCOUNTER — Encounter (HOSPITAL_COMMUNITY)
Admission: RE | Disposition: A | Discharge status: Home / Self Care | Payer: Self-pay | Source: Home / Self Care | Attending: Internal Medicine

## 2022-02-06 ENCOUNTER — Encounter (HOSPITAL_COMMUNITY): Payer: Self-pay | Admitting: Internal Medicine

## 2022-02-06 ENCOUNTER — Ambulatory Visit (HOSPITAL_BASED_OUTPATIENT_CLINIC_OR_DEPARTMENT_OTHER): Payer: Medicare HMO | Admitting: Anesthesiology

## 2022-02-06 ENCOUNTER — Ambulatory Visit (HOSPITAL_COMMUNITY)
Admission: RE | Admit: 2022-02-06 | Discharge: 2022-02-06 | Disposition: A | Discharge status: Home / Self Care | Payer: Medicare HMO | Attending: Internal Medicine | Admitting: Internal Medicine

## 2022-02-06 ENCOUNTER — Other Ambulatory Visit: Payer: Self-pay

## 2022-02-06 DIAGNOSIS — I10 Essential (primary) hypertension: Secondary | ICD-10-CM | POA: Diagnosis not present

## 2022-02-06 DIAGNOSIS — F418 Other specified anxiety disorders: Secondary | ICD-10-CM | POA: Diagnosis not present

## 2022-02-06 DIAGNOSIS — G4733 Obstructive sleep apnea (adult) (pediatric): Secondary | ICD-10-CM | POA: Diagnosis not present

## 2022-02-06 DIAGNOSIS — F419 Anxiety disorder, unspecified: Secondary | ICD-10-CM | POA: Insufficient documentation

## 2022-02-06 DIAGNOSIS — N189 Chronic kidney disease, unspecified: Secondary | ICD-10-CM | POA: Insufficient documentation

## 2022-02-06 DIAGNOSIS — Z8572 Personal history of non-Hodgkin lymphomas: Secondary | ICD-10-CM | POA: Insufficient documentation

## 2022-02-06 DIAGNOSIS — I129 Hypertensive chronic kidney disease with stage 1 through stage 4 chronic kidney disease, or unspecified chronic kidney disease: Secondary | ICD-10-CM | POA: Insufficient documentation

## 2022-02-06 DIAGNOSIS — K219 Gastro-esophageal reflux disease without esophagitis: Secondary | ICD-10-CM | POA: Diagnosis not present

## 2022-02-06 DIAGNOSIS — M1990 Unspecified osteoarthritis, unspecified site: Secondary | ICD-10-CM | POA: Diagnosis not present

## 2022-02-06 DIAGNOSIS — Z862 Personal history of diseases of the blood and blood-forming organs and certain disorders involving the immune mechanism: Secondary | ICD-10-CM | POA: Insufficient documentation

## 2022-02-06 DIAGNOSIS — Z7901 Long term (current) use of anticoagulants: Secondary | ICD-10-CM | POA: Insufficient documentation

## 2022-02-06 DIAGNOSIS — E785 Hyperlipidemia, unspecified: Secondary | ICD-10-CM | POA: Insufficient documentation

## 2022-02-06 DIAGNOSIS — Z91199 Patient's noncompliance with other medical treatment and regimen due to unspecified reason: Secondary | ICD-10-CM | POA: Diagnosis not present

## 2022-02-06 DIAGNOSIS — I4891 Unspecified atrial fibrillation: Secondary | ICD-10-CM

## 2022-02-06 DIAGNOSIS — G4730 Sleep apnea, unspecified: Secondary | ICD-10-CM | POA: Diagnosis not present

## 2022-02-06 DIAGNOSIS — D6869 Other thrombophilia: Secondary | ICD-10-CM | POA: Diagnosis not present

## 2022-02-06 DIAGNOSIS — F32A Depression, unspecified: Secondary | ICD-10-CM | POA: Diagnosis not present

## 2022-02-06 DIAGNOSIS — E039 Hypothyroidism, unspecified: Secondary | ICD-10-CM | POA: Insufficient documentation

## 2022-02-06 DIAGNOSIS — Z79899 Other long term (current) drug therapy: Secondary | ICD-10-CM | POA: Insufficient documentation

## 2022-02-06 DIAGNOSIS — I484 Atypical atrial flutter: Secondary | ICD-10-CM | POA: Diagnosis not present

## 2022-02-06 DIAGNOSIS — I4819 Other persistent atrial fibrillation: Secondary | ICD-10-CM | POA: Insufficient documentation

## 2022-02-06 HISTORY — PX: CARDIOVERSION: SHX1299

## 2022-02-06 SURGERY — CARDIOVERSION
Anesthesia: General

## 2022-02-06 MED ORDER — PROPOFOL 10 MG/ML IV BOLUS
INTRAVENOUS | Status: DC | PRN
  Administered 2022-02-06: 20 mg via INTRAVENOUS
  Administered 2022-02-06: 50 mg via INTRAVENOUS
  Administered 2022-02-06 (×2): 20 mg via INTRAVENOUS
  Administered 2022-02-06: 50 mg via INTRAVENOUS
  Administered 2022-02-06: 10 mg via INTRAVENOUS

## 2022-02-06 MED ORDER — SODIUM CHLORIDE 0.9 % IV SOLN: INTRAVENOUS | Status: DC

## 2022-02-06 MED ORDER — LIDOCAINE 2% (20 MG/ML) 5 ML SYRINGE
INTRAMUSCULAR | Status: DC | PRN
  Administered 2022-02-06: 60 mg via INTRAVENOUS

## 2022-02-06 NOTE — Interval H&P Note (Signed)
History and Physical Interval Note:  02/06/2022 9:58 AM  [name] [name]  has presented today for surgery, with the diagnosis of afib.  The various methods of treatment have been discussed with the patient and family. After consideration of risks, benefits and other options for treatment, the patient has consented to  Procedure(s): CARDIOVERSION (N/A) as a surgical intervention.  The patient's history has been reviewed, patient examined, no change in status, stable for surgery.  I have reviewed the patient's chart and labs.  Questions were answered to the patient's satisfaction.     [name]

## 2022-02-06 NOTE — Transfer of Care (Signed)
Immediate Anesthesia Transfer of Care Note  Patient: [name] [name]  Procedure(s) Performed: CARDIOVERSION  Patient Location: Endoscopy Unit  Anesthesia Type:MAC  Level of Consciousness: sedated  Airway & Oxygen Therapy: Patient Spontanous Breathing  Post-op Assessment: Report given to RN and Post -op Vital signs reviewed and stable  Post vital signs: Reviewed and stable  Last Vitals:  Vitals Value Taken Time  BP 102/71 02/06/22 1118  Temp 36.1 C 02/06/22 1118  Pulse 91 02/06/22 1121  Resp 27 02/06/22 1121  SpO2 97 % 02/06/22 1121  Vitals shown include unvalidated device data.  Last Pain:  Vitals:   02/06/22 1118  TempSrc: Temporal  PainSc:          Complications: No notable events documented.

## 2022-02-06 NOTE — Anesthesia Postprocedure Evaluation (Signed)
Anesthesia Post Note  Patient: [name] [name]  Procedure(s) Performed: CARDIOVERSION     Patient location during evaluation: PACU Anesthesia Type: General Level of consciousness: awake and alert Pain management: pain level controlled Vital Signs Assessment: post-procedure vital signs reviewed and stable Respiratory status: spontaneous breathing, nonlabored ventilation and respiratory function stable Cardiovascular status: blood pressure returned to baseline Postop Assessment: no apparent nausea or vomiting Anesthetic complications: no   No notable events documented.  Last Vitals:  Vitals:   02/06/22 1000 02/06/22 1118  BP: (!) 142/90 102/71  Pulse: (!) 109 91  Resp: 15 (!) 21  Temp: (!) 36.3 C (!) 36.1 C  SpO2: 94% 99%    Last Pain:  Vitals:   02/06/22 1118  TempSrc: Temporal  PainSc:                  Marthenia Rolling

## 2022-02-06 NOTE — Anesthesia Preprocedure Evaluation (Signed)
Anesthesia Evaluation  Patient identified by MRN, date of birth, ID band Patient awake    Reviewed: Allergy & Precautions, NPO status , Patient's Chart, lab work & pertinent test results  History of Anesthesia Complications Negative for: history of anesthetic complications  Airway Mallampati: II  TM Distance: >3 FB Neck ROM: Full    Dental  (+) Missing,    Pulmonary sleep apnea    Pulmonary exam normal        Cardiovascular hypertension, Normal cardiovascular exam+ dysrhythmias (on Eliquis) Atrial Fibrillation   TTE 11/21/21: EF 60-65%, moderately elevated PASP, moderate LAE, mild MR     Neuro/Psych   Anxiety Depression    negative neurological ROS     GI/Hepatic Neg liver ROS,GERD  Medicated,,  Endo/Other  Hypothyroidism    Renal/GU Renal InsufficiencyRenal disease  negative genitourinary   Musculoskeletal  (+) Arthritis ,    Abdominal   Peds  Hematology negative hematology ROS (+)   Anesthesia Other Findings Day of surgery medications reviewed with patient.  Reproductive/Obstetrics negative OB ROS                              Anesthesia Physical Anesthesia Plan  ASA: 3  Anesthesia Plan: General   Post-op Pain Management: Minimal or no pain anticipated   Induction: Intravenous  PONV Risk Score and Plan: Treatment may vary due to age or medical condition and Propofol infusion  Airway Management Planned: Mask  Additional Equipment: None  Intra-op Plan:   Post-operative Plan:   Informed Consent: I have reviewed the patients History and Physical, chart, labs and discussed the procedure including the risks, benefits and alternatives for the proposed anesthesia with the patient or authorized representative who has indicated his/her understanding and acceptance.       Plan Discussed with: CRNA  Anesthesia Plan Comments:          Anesthesia Quick Evaluation

## 2022-02-06 NOTE — Discharge Instructions (Signed)
Electrical Cardioversion °Electrical cardioversion is the delivery of a jolt of electricity to restore a normal rhythm to the heart. A rhythm that is too fast or is not regular keeps the heart from pumping well. In this procedure, sticky patches or metal paddles are placed on the chest to deliver electricity to the heart from a device. °This procedure may be done in an emergency if: °There is low or no blood pressure as a result of the heart rhythm. °Normal rhythm must be restored as fast as possible to protect the brain and heart from further damage. °It may save a life. °This may also be a scheduled procedure for irregular or fast heart rhythms that are not immediately life-threatening. ° °What can I expect after the procedure? °Your blood pressure, heart rate, breathing rate, and blood oxygen level will be monitored until you leave the hospital or clinic. °Your heart rhythm will be watched to make sure it does not change. °You may have some redness on the skin where the shocks were given. Over the counter cortizone cream may be helpful.  °Follow these instructions at home: °Do not drive for 24 hours if you were given a sedative during your procedure. °Take over-the-counter and prescription medicines only as told by your health care provider. °Ask your health care provider how to check your pulse. Check it often. °Rest for 48 hours after the procedure or as told by your health care provider. °Avoid or limit your caffeine use as told by your health care provider. °Keep all follow-up visits as told by your health care provider. This is important. °Contact a health care provider if: °You feel like your heart is beating too quickly or your pulse is not regular. °You have a serious muscle cramp that does not go away. °Get help right away if: °You have discomfort in your chest. °You are dizzy or you feel faint. °You have trouble breathing or you are short of breath. °Your speech is slurred. °You have trouble moving an  arm or leg on one side of your body. °Your fingers or toes turn cold or blue. °Summary °Electrical cardioversion is the delivery of a jolt of electricity to restore a normal rhythm to the heart. °This procedure may be done right away in an emergency or may be a scheduled procedure if the condition is not an emergency. °Generally, this is a safe procedure. °After the procedure, check your pulse often as told by your health care provider. °This information is not intended to replace advice given to you by your health care provider. Make sure you discuss any questions you have with your health care provider. °Document Revised: 08/01/2018 Document Reviewed: 08/01/2018 °Elsevier Patient Education © 2020 [employer]  °

## 2022-02-06 NOTE — CV Procedure (Signed)
Procedure: Electrical Cardioversion Indications:  Atrial Fibrillation  Procedure Details:  Consent: Risks of procedure as well as the alternatives and risks of each were explained to the (patient/caregiver).  Consent for procedure obtained.  Time Out: Verified patient identification, verified procedure, site/side was marked, verified correct patient position, special equipment/implants available, medications/allergies/relevent history reviewed, required imaging and test results available. PERFORMED.  Patient placed on cardiac monitor, pulse oximetry, supplemental oxygen as necessary.  Sedation given: see anesthesia note Pacer pads placed anterior and posterior chest.  Cardioverted 3 time(s).  Cardioversion with synchronized biphasic 200J shock.  Evaluation: Findings: Post procedure EKG shows: atrial fibrillation Complications: None Patient did tolerate procedure well.  Time Spent Directly with the Patient:  20 minutes   [name] 02/06/2022, 11:21 AM

## 2022-02-08 NOTE — Progress Notes (Unsigned)
Electrophysiology Office Follow up Visit Note:    Date:  02/08/2022   ID:  [name] [name], DOB 1945/07/10, MRN [id number]  PCP:  Harlan Stains, MD  [facility] Cardiologist:  Candee Furbish, MD  [facility] Electrophysiologist:  Vickie Epley, MD    Interval History:    [name] [name] is a 77 y.o. female who presents for a follow up visit. They were last seen in clinic 04/18/2021 for persistent AF. At that appointment we planned to restart amiodarone. Given a history of MDS, we discussed LAAO as a mechanism to avoid long term anticoagulation.   Since our last appointment she was diagnosed with myeloma and has received RCHOP chemo (large B Cell lymphoma).  Saw Ricky 01/21/2022 with recurrent atypical AFL. He planned DCCV. She takes Eliquis for stroke ppx.  She presented for DCCV 02/06/2022 but this was unsuccessful after 3 shocks.           Past Medical History:  Diagnosis Date   (HFpEF) heart failure with preserved ejection fraction ([place]) 07/06/2016   A-fib ([place])    paroxysmal   Acquired thrombophilia ([place]) 09/03/2021   Acute bronchitis 06/21/2020   Anemia 08/02/2020   Hgb 13.7-> 10.7 between 10/2019 and 07/2020   Anxiety    Arthritis    Benign neoplasm of colon 12/29/2011   Bilateral carpal tunnel syndrome 11/06/2015   Cataracts, bilateral    immature   Cervical spondylosis without myelopathy 11/06/2015   Chest pain 07/06/2016   Chronic cough    Chronic kidney disease, stage 3 unspecified ([place]) 03/26/2021   Chronic rhinitis 06/16/2017   CKD (chronic kidney disease) stage 3, GFR 30-59 ml/min (HCC)    Depression    Disequilibrium 08/01/2019   Drug-induced hypothyroidism 03/26/2021   Dyspnea on exertion 04/03/2016   PFT 08/02/20-mild obstruction, no resp to BD, mild reduction DLCO   Essential hypertension    Fatigue 07/06/2016   Gait instability 08/01/2019   Gastroesophageal reflux disease without esophagitis    Generalized anxiety disorder 11/25/2016   Generalized weakness  08/28/2021   Hardening of the aorta (main artery of the heart) ([place]) 03/26/2021   Hemorrhoid    History of bronchitis    couple of yrs ago   History of peristent atrial fibrillation    Hoarseness 05/12/2017   Hx of colonic polyps 05/24/2014   Hyperlipidemia    takes Pravastatin daily   Hypothyroidism 04/04/2018   Long term current use of amiodarone 10/19/2017   Loss of appetite 09/03/2021   Mitral regurgitation 02/27/2016   Moderate recurrent major depression ([place]) 09/03/2021   NHL (non-Hodgkin's lymphoma) ([place]) 09/03/2021   OSA (obstructive sleep apnea) 10/19/2017   HST 09/24/2018- AHI 30.5/ hr, desaturation to 76%, body weight 249.6 lbs Unable to tolerate CPAP, failed twice.   Other specified disorders of bone density and structure, other site 09/03/2021   PAF (paroxysmal atrial fibrillation) ([place]) 02/17/2016   Pancytopenia ([place]) 08/29/2021   Persistent atrial fibrillation ([place]) 10/31/2016   Prediabetes 09/03/2021   Primary localized osteoarthritis of left knee 09/21/2016   Primary osteoarthritis of both first carpometacarpal joints 11/06/2015   Pruritus 10/31/2016   Rectocele 09/03/2021   Rhinitis, allergic 03/19/2017   S/P lumbar laminectomy 04/05/2013   Secondary hypercoagulable state ([place]) 04/28/2021   Sensorineural hearing loss (SNHL) of both ears 08/01/2019   Severe recurrent major depression without psychotic features ([place]) 11/25/2016   Splenomegaly 08/20/2021   Syncope 09/03/2021   Upper airway cough syndrome 03/19/2017   Urinary urgency  Past Surgical History:  Procedure Laterality Date   ABDOMINAL HYSTERECTOMY  1980   partial   ABDOMINAL HYSTERECTOMY     partial   BACK SURGERY     CARDIOVERSION N/A 02/24/2016   Procedure: CARDIOVERSION;  Surgeon: Fay Records, MD;  Location: [place];  Service: Cardiovascular;  Laterality: N/A;   CARDIOVERSION N/A 03/19/2016   Procedure: CARDIOVERSION;  Surgeon: Fay Records, MD;  Location: [place];  Service: Cardiovascular;  Laterality: N/A;    CARDIOVERSION N/A 05/15/2021   Procedure: CARDIOVERSION;  Surgeon: Pixie Casino, MD;  Location: [facility] ENDOSCOPY;  Service: Cardiovascular;  Laterality: N/A;   CARDIOVERSION N/A 02/06/2022   Procedure: CARDIOVERSION;  Surgeon: [name], MD;  Location: [facility] ENDOSCOPY;  Service: Cardiovascular;  Laterality: N/A;   [place]     COLONOSCOPY     COLONOSCOPY WITH PROPOFOL  12/29/2011   Procedure: COLONOSCOPY WITH PROPOFOL;  Surgeon: Lear Ng, MD;  Location: [facility] ENDOSCOPY;  Service: Endoscopy;  Laterality: N/A;   COLONOSCOPY WITH PROPOFOL N/A 05/24/2014   Procedure: COLONOSCOPY WITH PROPOFOL;  Surgeon: Wilford Corner, MD;  Location: [facility] ENDOSCOPY;  Service: Endoscopy;  Laterality: N/A;   ESOPHAGEAL MANOMETRY N/A 02/22/2017   Procedure: ESOPHAGEAL MANOMETRY (EM);  Surgeon: Wilford Corner, MD;  Location: [facility] ENDOSCOPY;  Service: Endoscopy;  Laterality: N/A;   ESOPHAGOGASTRODUODENOSCOPY     FOOT SURGERY     left, right   FRACTURE SURGERY     left leg-knee   HEEL SPUR EXCISION Bilateral    HOT HEMOSTASIS  12/29/2011   Procedure: HOT HEMOSTASIS (ARGON PLASMA COAGULATION/BICAP);  Surgeon: Lear Ng, MD;  Location: [facility];  Service: Endoscopy;  Laterality: N/A;   HOT HEMOSTASIS N/A 05/24/2014   Procedure: HOT HEMOSTASIS (ARGON PLASMA COAGULATION/BICAP);  Surgeon: Wilford Corner, MD;  Location: [facility] ENDOS[facility];  Service: Endoscopy;  Laterality: N/A;   IR BONE MARROW BIOPSY & ASPIRATION  08/13/2021   IR IMAGING GUIDED PORT INSERTION  09/09/2021   leg surgery d/t break Left    LUMBAR LAMINECTOMY/DECOMPRESSION MICRODISCECTOMY Left 04/05/2013   Procedure: LUMBAR FOUR TO FIVE LUMBAR LAMINECTOMY/DECOMPRESSION MICRODISCECTOMY 1 LEVEL;  Surgeon: Eustace Moore, MD;  Location: [place] NEURO ORS;  Service: Neurosurgery;  Laterality: Left;   LYMPH NODE BIOPSY Left 08/30/2021   Procedure: LYMPH NODE BIOPSY;  Surgeon: Jovita Kussmaul, MD;   Location: [facility] ORS;  Service: General;  Laterality: Left;   NISSEN FUNDOPLICATION     [place] IMPEDANCE STUDY N/A 02/22/2017   Procedure: [place] IMPEDANCE STUDY;  Surgeon: Wilford Corner, MD;  Location: [facility] ENDOSCOPY;  Service: Endoscopy;  Laterality: N/A;   TEE WITHOUT CARDIOVERSION N/A 02/24/2016   Procedure: TRANSESOPHAGEAL ECHOCARDIOGRAM (TEE);  Surgeon: Fay Records, MD;  Location: [place];  Service: Cardiovascular;  Laterality: N/A;   TOTAL KNEE ARTHROPLASTY Left 10/07/2016   TOTAL KNEE ARTHROPLASTY Left 10/07/2016   Procedure: TOTAL KNEE ARTHROPLASTY;  Surgeon: [name], MD;  Location: [place];  Service: Orthopedics;  Laterality: Left;    Current Medications: No outpatient medications have been marked as taking for the 02/09/22 encounter (Appointment) with Vickie Epley, MD.     Allergies:   Omnicef [cefdinir], Rituxan [rituximab], Bacitracin, Bacitracin-polymyxin b, Duloxetine hcl, Lexapro [escitalopram], Mirtazapine, Penicillins, Peroxide [hydrogen peroxide], Zoloft [sertraline hcl], and Neosporin [neomycin-bacitracin zn-polymyx]   Social History   Socioeconomic History   Marital status: Married    Spouse name: [name] [name]   Number of children: 2  Years of education: 10   Highest education level: Some college, no degree  Occupational History   Occupation: retired  Tobacco Use   Smoking status: Never   Smokeless tobacco: Never   Tobacco comments:    Never smoke 05/22/21  Vaping Use   Vaping Use: Never used  Substance and Sexual Activity   Alcohol use: No   Drug use: No   Sexual activity: Yes    Birth control/protection: Surgical  Other Topics Concern   Not on file  Social History Narrative   Right handed   Drinks caffeine   Two story home   Social Determinants of Health   Financial Resource Strain: Low Risk  (11/25/2016)   Overall Financial Resource Strain (CARDIA)    Difficulty of Paying Living Expenses: Not very hard  Food Insecurity: Unknown (11/25/2016)    Hunger Vital Sign    Worried About Running Out of Food in the Last Year: Patient refused    [place] in the Last Year: Patient refused  Transportation Needs: Unknown (11/25/2016)   [place] - Transportation    Lack of Transportation (Medical): Patient refused    Lack of Transportation (Non-Medical): Patient refused  Physical Activity: Unknown (11/25/2016)   Exercise Vital Sign    Days of Exercise per Week: Patient refused    Minutes of Exercise per Session: Patient refused  Stress: Stress Concern Present (11/25/2016)   [place]    Feeling of Stress : Very much  Social Connections: Moderately Integrated (11/25/2016)   Social Connection and Isolation Panel [NHANES]    Frequency of Communication with Friends and Family: Twice a week    Frequency of Social Gatherings with Friends and Family: Twice a week    Attends Religious Services: More than 4 times per year    Active Member of [employer] or Organizations: No    Attends Archivist Meetings: Never    Marital Status: Married     Family History: The patient's family history includes Colon cancer in her mother; Heart disease in her father. There is no history of Breast cancer or Thyroid disease.  ROS:   Please see the history of present illness.    All other systems reviewed and are negative.  EKGs/Labs/Other Studies Reviewed:    The following studies were reviewed today:  11/2021 Echo - EF 60. RV normal. LA dilated.  EKG:  The ekg ordered today demonstrates ***  Recent Labs: 09/03/2021: TSH 0.149 01/08/2022: ALT 13; Magnesium 1.8 02/05/2022: BUN 14; Creatinine, Ser 1.50; Hemoglobin 12.4; Platelets 136; Potassium 4.6; Sodium 143  Recent Lipid Panel    Component Value Date/Time   CHOL 125 02/18/2016 0454   TRIG 90 02/18/2016 0454   HDL 41 02/18/2016 0454   CHOLHDL 3.0 02/18/2016 0454   VLDL 18 02/18/2016 0454   LDLCALC 66 02/18/2016 0454     Physical Exam:    VS:  There were no vitals taken for this visit.    Wt Readings from Last 3 Encounters:  02/06/22 201 lb (91.2 kg)  01/21/22 199 lb (90.3 kg)  01/08/22 196 lb 12.8 oz (89.3 kg)     GEN: *** Well nourished, well developed in no acute distress CARDIAC: ***RRR, no murmurs, rubs, gallops RESPIRATORY:  Clear to auscultation without rales, wheezing or rhonchi        ASSESSMENT:    No diagnosis found. PLAN:    In order of problems listed above:   #Permanent AF  Continue eliquis.  Increase metoprolol succinate to '50mg'$  PO daily. We will slowly uptitrate this as tolerated in an effort to discontinue the amiodarone to avoid associated off target effects. She will follow up with Ricky in 4 weeks for further adjustments of her rate control regimen.          Total time spent with patient today *** minutes. This includes reviewing records, evaluating the patient and coordinating care.   Medication Adjustments/Labs and Tests Ordered: Current medicines are reviewed at length with the patient today.  Concerns regarding medicines are outlined above.  No orders of the defined types were placed in this encounter.  No orders of the defined types were placed in this encounter.    Signed, Lars Mage, MD, [facility], [facility] 02/08/2022 5:49 PM    Electrophysiology  [facility]

## 2022-02-09 ENCOUNTER — Encounter: Payer: Self-pay | Admitting: Cardiology

## 2022-02-09 ENCOUNTER — Ambulatory Visit: Payer: Medicare HMO | Attending: Cardiology | Admitting: Cardiology

## 2022-02-09 VITALS — BP 120/64 | HR 106 | Ht 67.0 in | Wt 205.0 lb

## 2022-02-09 DIAGNOSIS — I4821 Permanent atrial fibrillation: Secondary | ICD-10-CM

## 2022-02-09 DIAGNOSIS — I484 Atypical atrial flutter: Secondary | ICD-10-CM

## 2022-02-09 MED ORDER — METOPROLOL SUCCINATE ER 50 MG PO TB24: 50.0000 mg | ORAL_TABLET | Freq: Every evening | ORAL | 3 refills | Status: DC

## 2022-02-09 NOTE — Progress Notes (Signed)
Electrophysiology Office Follow up Visit Note:    Date:  02/09/2022   ID:  FALLEN CRISOSTOMO, DOB 02-Dec-1945, MRN [id number]  PCP:  Harlan Stains, MD  [place] Cardiologist:  Candee Furbish, MD  [facility] Electrophysiologist:  Vickie Epley, MD    Interval History:    [name] [name] is a 77 y.o. female who presents for a follow up visit. They were last seen in clinic 04/18/2021 for persistent AF. At that appointment we planned to restart amiodarone. Given a history of MDS, we discussed LAAO as a mechanism to avoid long term anticoagulation.   Since our last appointment she was diagnosed with myeloma and has received RCHOP chemo (large B Cell lymphoma).  Saw Ricky 01/21/2022 with recurrent atypical AFL. He planned DCCV. She takes Eliquis for stroke ppx.  She presented for DCCV 02/06/2022 but this was unsuccessful after 3 shocks.   Today, she is accompanied by her husband. She has some skin irritation on her chest due to burning from her DCCV. She reports pain on Friday and Saturday. Her HR today is 105/106 bpm. She is complaint with her blood thinners. She reports that her blood work is improving according to recent blood work.  She will FU with her oncologist tomorrow. She currently wears a port and has experienced hair loss due to chemotherapy.  She denies any shortness of breath, or peripheral edema. No lightheadedness, headaches, syncope, orthopnea, or PND.     Past Medical History:  Diagnosis Date   (HFpEF) heart failure with preserved ejection fraction ([place]) 07/06/2016   A-fib ([place])    paroxysmal   Acquired thrombophilia ([place]) 09/03/2021   Acute bronchitis 06/21/2020   Anemia 08/02/2020   Hgb 13.7-> 10.7 between 10/2019 and 07/2020   Anxiety    Arthritis    Benign neoplasm of colon 12/29/2011   Bilateral carpal tunnel syndrome 11/06/2015   Cataracts, bilateral    immature   Cervical spondylosis without myelopathy 11/06/2015   Chest pain 07/06/2016   Chronic cough     Chronic kidney disease, stage 3 unspecified ([place]) 03/26/2021   Chronic rhinitis 06/16/2017   CKD (chronic kidney disease) stage 3, GFR 30-59 ml/min (HCC)    Depression    Disequilibrium 08/01/2019   Drug-induced hypothyroidism 03/26/2021   Dyspnea on exertion 04/03/2016   PFT 08/02/20-mild obstruction, no resp to BD, mild reduction DLCO   Essential hypertension    Fatigue 07/06/2016   Gait instability 08/01/2019   Gastroesophageal reflux disease without esophagitis    Generalized anxiety disorder 11/25/2016   Generalized weakness 08/28/2021   Hardening of the aorta (main artery of the heart) ([place]) 03/26/2021   Hemorrhoid    History of bronchitis    couple of yrs ago   History of peristent atrial fibrillation    Hoarseness 05/12/2017   Hx of colonic polyps 05/24/2014   Hyperlipidemia    takes Pravastatin daily   Hypothyroidism 04/04/2018   Long term current use of amiodarone 10/19/2017   Loss of appetite 09/03/2021   Mitral regurgitation 02/27/2016   Moderate recurrent major depression ([place]) 09/03/2021   NHL (non-Hodgkin's lymphoma) ([place]) 09/03/2021   OSA (obstructive sleep apnea) 10/19/2017   HST 09/24/2018- AHI 30.5/ hr, desaturation to 76%, body weight 249.6 lbs Unable to tolerate CPAP, failed twice.   Other specified disorders of bone density and structure, other site 09/03/2021   PAF (paroxysmal atrial fibrillation) ([place]) 02/17/2016   Pancytopenia ([place]) 08/29/2021   Persistent atrial fibrillation ([place]) 10/31/2016   Prediabetes 09/03/2021  Primary localized osteoarthritis of left knee 09/21/2016   Primary osteoarthritis of both first carpometacarpal joints 11/06/2015   Pruritus 10/31/2016   Rectocele 09/03/2021   Rhinitis, allergic 03/19/2017   S/P lumbar laminectomy 04/05/2013   Secondary hypercoagulable state ([place]) 04/28/2021   Sensorineural hearing loss (SNHL) of both ears 08/01/2019   Severe recurrent major depression without psychotic features ([place]) 11/25/2016   Splenomegaly 08/20/2021   Syncope  09/03/2021   Upper airway cough syndrome 03/19/2017   Urinary urgency     Past Surgical History:  Procedure Laterality Date   ABDOMINAL HYSTERECTOMY  1980   partial   ABDOMINAL HYSTERECTOMY     partial   BACK SURGERY     CARDIOVERSION N/A 02/24/2016   Procedure: CARDIOVERSION;  Surgeon: Fay Records, MD;  Location: [place];  Service: Cardiovascular;  Laterality: N/A;   CARDIOVERSION N/A 03/19/2016   Procedure: CARDIOVERSION;  Surgeon: Fay Records, MD;  Location: [place];  Service: Cardiovascular;  Laterality: N/A;   CARDIOVERSION N/A 05/15/2021   Procedure: CARDIOVERSION;  Surgeon: Pixie Casino, MD;  Location: [facility] ENDOSCOPY;  Service: Cardiovascular;  Laterality: N/A;   CARDIOVERSION N/A 02/06/2022   Procedure: CARDIOVERSION;  Surgeon: [name], MD;  Location: [facility] ENDOSCOPY;  Service: Cardiovascular;  Laterality: N/A;   [place]     COLONOSCOPY     COLONOSCOPY WITH PROPOFOL  12/29/2011   Procedure: COLONOSCOPY WITH PROPOFOL;  Surgeon: Lear Ng, MD;  Location: [facility] ENDOSCOPY;  Service: Endoscopy;  Laterality: N/A;   COLONOSCOPY WITH PROPOFOL N/A 05/24/2014   Procedure: COLONOSCOPY WITH PROPOFOL;  Surgeon: Wilford Corner, MD;  Location: [facility] ENDOSCOPY;  Service: Endoscopy;  Laterality: N/A;   ESOPHAGEAL MANOMETRY N/A 02/22/2017   Procedure: ESOPHAGEAL MANOMETRY (EM);  Surgeon: Wilford Corner, MD;  Location: [facility] ENDOSCOPY;  Service: Endoscopy;  Laterality: N/A;   ESOPHAGOGASTRODUODENOSCOPY     FOOT SURGERY     left, right   FRACTURE SURGERY     left leg-knee   HEEL SPUR EXCISION Bilateral    HOT HEMOSTASIS  12/29/2011   Procedure: HOT HEMOSTASIS (ARGON PLASMA COAGULATION/BICAP);  Surgeon: Lear Ng, MD;  Location: [facility] END[facility];  Service: Endoscopy;  Laterality: N/A;   HOT HEMOSTASIS N/A 05/24/2014   Procedure: HOT HEMOSTASIS (ARGON PLASMA COAGULATION/BICAP);  Surgeon: Wilford Corner, MD;   Location: [facility] ENDOSCOPY;  Service: Endoscopy;  Laterality: N/A;   IR BONE MARROW BIOPSY & ASPIRATION  08/13/2021   IR IMAGING GUIDED PORT INSERTION  09/09/2021   leg surgery d/t break Left    LUMBAR LAMINECTOMY/DECOMPRESSION MICRODISCECTOMY Left 04/05/2013   Procedure: LUMBAR FOUR TO FIVE LUMBAR LAMINECTOMY/DECOMPRESSION MICRODISCECTOMY 1 LEVEL;  Surgeon: Eustace Moore, MD;  Location: [place] NEURO ORS;  Service: Neurosurgery;  Laterality: Left;   LYMPH NODE BIOPSY Left 08/30/2021   Procedure: LYMPH NODE BIOPSY;  Surgeon: Jovita Kussmaul, MD;  Location: [facility] ORS;  Service: General;  Laterality: Left;   NISSEN FUNDOPLICATION     [place] IMPEDANCE STUDY N/A 02/22/2017   Procedure: [place] IMPEDANCE STUDY;  Surgeon: Wilford Corner, MD;  Location: [facility] ENDOSCOPY;  Service: Endoscopy;  Laterality: N/A;   TEE WITHOUT CARDIOVERSION N/A 02/24/2016   Procedure: TRANSESOPHAGEAL ECHOCARDIOGRAM (TEE);  Surgeon: Fay Records, MD;  Location: [place];  Service: Cardiovascular;  Laterality: N/A;   TOTAL KNEE ARTHROPLASTY Left 10/07/2016   TOTAL KNEE ARTHROPLASTY Left 10/07/2016   Procedure: TOTAL KNEE ARTHROPLASTY;  Surgeon: [name], MD;  Location:  [place] OR;  Service: Orthopedics;  Laterality: Left;    Current Medications: Current Meds  Medication Sig   acetaminophen (TYLENOL) 500 MG tablet Take 1,000 mg by mouth every 6 (six) hours as needed for moderate pain.   albuterol (VENTOLIN HFA) 108 (90 Base) MCG/ACT inhaler Inhale 1-2 puffs into the lungs every 4 (four) hours as needed for wheezing or shortness of breath.   ALPRAZolam (XANAX) 0.5 MG tablet Take 1 tablet (0.5 mg total) by mouth 3 (three) times daily as needed for anxiety.   amiodarone (PACERONE) 200 MG tablet Take 1 tablet by mouth twice a day for 30 days then reduce to once a day (Patient taking differently: Take 200 mg by mouth 2 (two) times daily. Take 1 tablet by mouth twice a day for 30 days then reduce to once a day)   apixaban (ELIQUIS) 5 MG TABS tablet  Take 1 tablet (5 mg total) by mouth 2 (two) times daily.   benzonatate (TESSALON) 200 MG capsule Take 1 capsule by mouth three times daily as needed for cough   buPROPion (WELLBUTRIN XL) 300 MG 24 hr tablet Take 150 mg by mouth daily.   busPIRone (BUSPAR) 10 MG tablet Take 10 mg by mouth 2 (two) times daily.   famotidine (PEPCID) 20 MG tablet Take 20 mg by mouth daily as needed for heartburn or indigestion.   fexofenadine (ALLEGRA) 180 MG tablet Take 180 mg by mouth daily.   furosemide (LASIX) 20 MG tablet Take 1 tablet (20 mg total) by mouth daily as needed for edema or fluid. (Patient taking differently: Take 20 mg by mouth every other day.)   levothyroxine (SYNTHROID) 75 MCG tablet TAKE 1 TABLET DAILY BEFORE BREAKFAST (NEED APPOINTMENT FOR FURTHER REFILLS)   lidocaine-prilocaine (EMLA) cream Apply to Port-A-Cath site 1 to 2 hours prior to use   Magnesium 250 MG TABS Take 250 mg by mouth daily.   metoprolol succinate (TOPROL-XL) 25 MG 24 hr tablet Take 1 tablet (25 mg total) by mouth daily.   polyethylene glycol (MIRALAX) 17 g packet Take 17 g by mouth daily. (Patient taking differently: Take 17 g by mouth daily as needed for mild constipation or moderate constipation.)   potassium chloride SA (KLOR-CON M) 20 MEQ tablet Take 1 tablet (20 mEq total) by mouth daily as needed (Take potassium only when you take the Furosemide (Lasix)). (Patient taking differently: Take 20 mEq by mouth every other day.)   pravastatin (PRAVACHOL) 40 MG tablet Take 40 mg by mouth at bedtime.   sodium chloride (OCEAN) 0.65 % SOLN nasal spray Place 1 spray into both nostrils daily as needed for congestion.   Spacer/Aero-Holding Chambers (AEROCHAMBER MV) inhaler Use as instructed     Allergies:   Omnicef [cefdinir], Rituxan [rituximab], Bacitracin, Bacitracin-polymyxin b, Duloxetine hcl, Lexapro [escitalopram], Mirtazapine, Penicillins, Peroxide [hydrogen peroxide], Zoloft [sertraline hcl], and Neosporin  [neomycin-bacitracin zn-polymyx]   Social History   Socioeconomic History   Marital status: Married    Spouse name: Dominica Severin   Number of children: 2   Years of education: 12   Highest education level: Some college, no degree  Occupational History   Occupation: retired  Tobacco Use   Smoking status: Never   Smokeless tobacco: Never   Tobacco comments:    Never smoke 05/22/21  Vaping Use   Vaping Use: Never used  Substance and Sexual Activity   Alcohol use: No   Drug use: No   Sexual activity: Yes    Birth control/protection: Surgical  Other  Topics Concern   Not on file  Social History Narrative   Right handed   Drinks caffeine   Two story home   Social Determinants of Health   Financial Resource Strain: Low Risk  (11/25/2016)   Overall Financial Resource Strain (CARDIA)    Difficulty of Paying Living Expenses: Not very hard  Food Insecurity: Unknown (11/25/2016)   Hunger Vital Sign    Worried About Running Out of Food in the Last Year: Patient refused    [place] in the Last Year: Patient refused  Transportation Needs: Unknown (11/25/2016)   [place] - Hydrologist (Medical): Patient refused    Lack of Transportation (Non-Medical): Patient refused  Physical Activity: Unknown (11/25/2016)   Exercise Vital Sign    Days of Exercise per Week: Patient refused    Minutes of Exercise per Session: Patient refused  Stress: Stress Concern Present (11/25/2016)   [place]    Feeling of Stress : Very much  Social Connections: Moderately Integrated (11/25/2016)   Social Connection and Isolation Panel [NHANES]    Frequency of Communication with Friends and Family: Twice a week    Frequency of Social Gatherings with Friends and Family: Twice a week    Attends Religious Services: More than 4 times per year    Active Member of [employer] or Organizations: No    Attends Theatre manager Meetings: Never    Marital Status: Married     Family History: The patient's family history includes Colon cancer in her mother; Heart disease in her father. There is no history of Breast cancer or Thyroid disease.  ROS:   Please see the history of present illness.    (+)skin irritation on chest All other systems reviewed and are negative.  EKGs/Labs/Other Studies Reviewed:    The following studies were reviewed today:  11/2021 Echo - EF 60. RV normal. LA dilated.  EKG:  EKG is personally reviewed. 02/09/22: Atypical atrial flutter with a ventricular rate of 106 bpm.  Recent Labs: 09/03/2021: TSH 0.149 01/08/2022: ALT 13; Magnesium 1.8 02/05/2022: BUN 14; Creatinine, Ser 1.50; Hemoglobin 12.4; Platelets 136; Potassium 4.6; Sodium 143  Recent Lipid Panel    Component Value Date/Time   CHOL 125 02/18/2016 0454   TRIG 90 02/18/2016 0454   HDL 41 02/18/2016 0454   CHOLHDL 3.0 02/18/2016 0454   VLDL 18 02/18/2016 0454   LDLCALC 66 02/18/2016 0454    Physical Exam:    VS:  BP 120/64   Pulse (!) 106   Ht '5\' 7"'$  (1.702 m)   Wt 205 lb (93 kg)   SpO2 98%   BMI 32.11 kg/m     Wt Readings from Last 3 Encounters:  02/09/22 205 lb (93 kg)  02/06/22 201 lb (91.2 kg)  01/21/22 199 lb (90.3 kg)     GEN:  Well nourished, well developed in no acute distress CARDIAC: Irregularly irregular, no murmurs, rubs, gallops RESPIRATORY:  Clear to auscultation without rales, wheezing or rhonchi  Skin: Mild skin irritation on the anterior chest consistent with defibrillation pads      ASSESSMENT:    No diagnosis found. PLAN:    In order of problems listed above:   #Permanent AF  Continue eliquis.  Increase metoprolol succinate to '50mg'$  PO daily.   Discontinue amiodarone today   She will follow up with Ricky in 4 weeks for further adjustments of her rate control regimen.   #  Large B-cell lymphoma Just completed her last chemo session.  Follow-up scheduled with  her oncologist in the next few weeks.   Medication Adjustments/Labs and Tests Ordered: Current medicines are reviewed at length with the patient today.  Concerns regarding medicines are outlined above.  No orders of the defined types were placed in this encounter.  No orders of the defined types were placed in this encounter.   I,Mitra Faeizi,acting as a Education administrator for Vickie Epley, MD.,have documented all relevant documentation on the behalf of Vickie Epley, MD,as directed by  Vickie Epley, MD while in the presence of Vickie Epley, MD.  I, Vickie Epley, MD, have reviewed all documentation for this visit. The documentation on 02/09/22 for the exam, diagnosis, procedures, and orders are all accurate and complete.   Signed, Lars Mage, MD, [facility], [facility] 02/09/2022 9:01 AM    Electrophysiology [facility]

## 2022-02-09 NOTE — Patient Instructions (Signed)
Medication Instructions:  Your physician has recommended you make the following change in your medication:  1) STOP taking amiodarone 2) INCREASE metoprolol to 50 mg every night *If you need a refill on your cardiac medications before your next appointment, please call your pharmacy*  Follow-Up: At [facility], you and your health needs are our priority.  As part of our continuing mission to provide you with exceptional heart care, we have created designated Provider Care Teams.  These Care Teams include your primary Cardiologist (physician) and Advanced Practice Providers (APPs -  Physician Assistants and Nurse Practitioners) who all work together to provide you with the care you need, when you need it.  Your next appointment:   4 week(s)  Provider:   You will follow up in the [facility] located at [facility]. Your provider will be: Clint R. Fenton, PA-C

## 2022-02-10 ENCOUNTER — Other Ambulatory Visit: Payer: Self-pay

## 2022-02-10 ENCOUNTER — Inpatient Hospital Stay: Payer: Medicare HMO | Attending: Oncology

## 2022-02-10 ENCOUNTER — Inpatient Hospital Stay: Payer: Medicare HMO

## 2022-02-10 ENCOUNTER — Inpatient Hospital Stay (HOSPITAL_BASED_OUTPATIENT_CLINIC_OR_DEPARTMENT_OTHER): Payer: Medicare HMO | Admitting: Oncology

## 2022-02-10 VITALS — BP 131/61 | HR 91 | Temp 98.2°F | Resp 18 | Ht 67.0 in | Wt 203.0 lb

## 2022-02-10 DIAGNOSIS — G4730 Sleep apnea, unspecified: Secondary | ICD-10-CM | POA: Insufficient documentation

## 2022-02-10 DIAGNOSIS — R0609 Other forms of dyspnea: Secondary | ICD-10-CM | POA: Diagnosis not present

## 2022-02-10 DIAGNOSIS — C8330 Diffuse large B-cell lymphoma, unspecified site: Secondary | ICD-10-CM | POA: Diagnosis not present

## 2022-02-10 DIAGNOSIS — R2681 Unsteadiness on feet: Secondary | ICD-10-CM | POA: Diagnosis not present

## 2022-02-10 DIAGNOSIS — R630 Anorexia: Secondary | ICD-10-CM | POA: Diagnosis not present

## 2022-02-10 DIAGNOSIS — D696 Thrombocytopenia, unspecified: Secondary | ICD-10-CM | POA: Insufficient documentation

## 2022-02-10 DIAGNOSIS — C8598 Non-Hodgkin lymphoma, unspecified, lymph nodes of multiple sites: Secondary | ICD-10-CM

## 2022-02-10 DIAGNOSIS — Z79899 Other long term (current) drug therapy: Secondary | ICD-10-CM | POA: Insufficient documentation

## 2022-02-10 DIAGNOSIS — N189 Chronic kidney disease, unspecified: Secondary | ICD-10-CM | POA: Diagnosis not present

## 2022-02-10 DIAGNOSIS — R5383 Other fatigue: Secondary | ICD-10-CM | POA: Diagnosis not present

## 2022-02-10 DIAGNOSIS — I2510 Atherosclerotic heart disease of native coronary artery without angina pectoris: Secondary | ICD-10-CM | POA: Diagnosis not present

## 2022-02-10 DIAGNOSIS — I4891 Unspecified atrial fibrillation: Secondary | ICD-10-CM | POA: Insufficient documentation

## 2022-02-10 DIAGNOSIS — I129 Hypertensive chronic kidney disease with stage 1 through stage 4 chronic kidney disease, or unspecified chronic kidney disease: Secondary | ICD-10-CM | POA: Insufficient documentation

## 2022-02-10 DIAGNOSIS — R918 Other nonspecific abnormal finding of lung field: Secondary | ICD-10-CM | POA: Insufficient documentation

## 2022-02-10 DIAGNOSIS — D539 Nutritional anemia, unspecified: Secondary | ICD-10-CM | POA: Insufficient documentation

## 2022-02-10 LAB — CMP (CANCER CENTER ONLY)
ALT: 9 U/L (ref 0–44)
AST: 15 U/L (ref 15–41)
Albumin: 4 g/dL (ref 3.5–5.0)
Alkaline Phosphatase: 59 U/L (ref 38–126)
Anion gap: 8 (ref 5–15)
BUN: 22 mg/dL (ref 8–23)
CO2: 28 mmol/L (ref 22–32)
Calcium: 9.5 mg/dL (ref 8.9–10.3)
Chloride: 107 mmol/L (ref 98–111)
Creatinine: 1.51 mg/dL — ABNORMAL HIGH (ref 0.44–1.00)
GFR, Estimated: 36 mL/min — ABNORMAL LOW (ref >60)
Glucose, Bld: 131 mg/dL — ABNORMAL HIGH (ref 70–99)
Potassium: 4 mmol/L (ref 3.5–5.1)
Sodium: 143 mmol/L (ref 135–145)
Total Bilirubin: 0.5 mg/dL (ref 0.3–1.2)
Total Protein: 6.2 g/dL — ABNORMAL LOW (ref 6.5–8.1)

## 2022-02-10 LAB — CBC WITH DIFFERENTIAL (CANCER CENTER ONLY)
Abs Immature Granulocytes: 0.01 10*3/uL (ref 0.00–0.07)
Basophils Absolute: 0.1 10*3/uL (ref 0.0–0.1)
Basophils Relative: 1 %
Eosinophils Absolute: 0.2 10*3/uL (ref 0.0–0.5)
Eosinophils Relative: 4 %
HCT: 36.5 % (ref 36.0–46.0)
Hemoglobin: 12.1 g/dL (ref 12.0–15.0)
Immature Granulocytes: 0 %
Lymphocytes Relative: 28 %
Lymphs Abs: 1.2 10*3/uL (ref 0.7–4.0)
MCH: 33 pg (ref 26.0–34.0)
MCHC: 33.2 g/dL (ref 30.0–36.0)
MCV: 99.5 fL (ref 80.0–100.0)
Monocytes Absolute: 0.6 10*3/uL (ref 0.1–1.0)
Monocytes Relative: 13 %
Neutro Abs: 2.3 10*3/uL (ref 1.7–7.7)
Neutrophils Relative %: 54 %
Platelet Count: 157 10*3/uL (ref 150–400)
RBC: 3.67 MIL/uL — ABNORMAL LOW (ref 3.87–5.11)
RDW: 15.7 % — ABNORMAL HIGH (ref 11.5–15.5)
WBC Count: 4.3 10*3/uL (ref 4.0–10.5)
nRBC: 0 % (ref 0.0–0.2)

## 2022-02-10 LAB — LACTATE DEHYDROGENASE: LDH: 203 U/L — ABNORMAL HIGH (ref 98–192)

## 2022-02-10 NOTE — Progress Notes (Signed)
[place] OFFICE PROGRESS NOTE   Diagnosis: Non-Hodgkin's lymphoma  INTERVAL HISTORY:   Ms. Ragon returns as scheduled.  She completed a final cycle of chemotherapy on 01/08/2022.  No new complaint.  No neuropathy symptoms.  No fever or night sweats.  Stable nodularity in the left groin following surgery.  An attempt at cardioversion on 02/06/2022 was unsuccessful.   Objective:  Vital signs in last 24 hours:  Blood pressure 131/61, pulse 91, temperature 98.2 F (36.8 C), temperature source Oral, resp. rate 18, height '5\' 7"'$  (1.702 m), weight 203 lb (92.1 kg), SpO2 98 %.    HEENT: No thrush or ulcers Lymphatics: No cervical, supraclavicular, axillary, or inguinal nodes.  3-4 cm firm smooth round nodularity at the left inguinal region Resp: Lungs clear bilaterally Cardio: Irregular GI: No hepatosplenomegaly Vascular: No leg edema    Lab Results:  Lab Results  Component Value Date   WBC 4.3 02/10/2022   HGB 12.1 02/10/2022   HCT 36.5 02/10/2022   MCV 99.5 02/10/2022   PLT 157 02/10/2022   NEUTROABS 2.3 02/10/2022    CMP  Lab Results  Component Value Date   NA 143 02/10/2022   K 4.0 02/10/2022   CL 107 02/10/2022   CO2 28 02/10/2022   GLUCOSE 131 (H) 02/10/2022   BUN 22 02/10/2022   CREATININE 1.51 (H) 02/10/2022   CALCIUM 9.5 02/10/2022   PROT 6.2 (L) 02/10/2022   ALBUMIN 4.0 02/10/2022   AST 15 02/10/2022   ALT 9 02/10/2022   ALKPHOS 59 02/10/2022   BILITOT 0.5 02/10/2022   GFRNONAA 36 (L) 02/10/2022   GFRAA 46 (L) 11/15/2017     Medications: I have reviewed the patient's current medications.   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  Findings concerning for a low-grade  myelodysplastic syndrome.  Negative myeloma FISH panel, 46XX; neotype myeloid disorders profile NGS sequencing-no pathogenic mutations detected in any of the genes on the NGS panel -PNH screen - 08/26/2020 -CTs 02/17/2021-new splenomegaly; diffuse bilateral bronchial wall thickening; clustered groundglass and fine nodular opacity in the dependent right lower lobe.  CAD. -Bone marrow biopsy 08/13/2021-hypercellular bone marrow for age with trilineage hematopoiesis; several atypical lymphoid aggregates present; no increase in blastic cells; flow cytometry without significant T or B-cell abnormalities; normal cytogenetics; "the limited morphologic and immunohistochemical features are atypical and a lymphoproliferative process is not excluded" -PET scan 1/47/8295-[id number] hypermetabolic lymphadenopathy in the neck, left axilla, mediastinum, retroperitoneum, and left pelvis.  Hypermetabolic skeletal lesions, generalized bilateral pulmonary hypermetabolism -08/30/2021-1 unit RBCs -Excisional lymph node biopsy left groin 08/30/2021-Large B-cell lymphoma, CD20 positive, CD30 positive; FISH analysis-no evidence of a double/triple hit lymphoma -Cycle 1 R-CHOP 09/05/2021, [place] 09/06/2021 for 7 days -Cycle 2 R-CHOP 09/29/2021, Uden[place]ca -Cycle 3 R-CHOP 10/20/2021, [place]den[place]ca -Cycle 4 R-CHOP 11/10/2021, Udenyca -PET 11/21/2021-complete resolution of hypermetabolic lymphadenopathy in some areas and marked decrease in hypermetabolic lymphadenopathy and others, [place] 1-2, diffuse groundglass lung opacity with hypermetabolism has resolved, decrease in splenomegaly -Cycle 5 R-CHOP 12/11/2021 -Cycle 6 R-CHOP 01/08/2022 Fatigue Dyspnea on exertion Unsteady gait/balance disorder, followed by Dr. Tomi Likens CKD Atrial fibrillation Hypertension Sleep apnea Anorexia/weight loss 06/13/2021 skin rash, question vasculitis-course of prednisone initiated; 06/20/2021 marked improvement, rash recurred when she was tapered off of prednisone,  prednisone resumed by dermatology History of coagulopathy-most likely related to malnutrition and apixaban Port-A-Cath placement  09/09/2021      Disposition: Ms. Kissick is in clinical remission from Hodgkin's lymphoma.  The Port-A-Cath remains in place.  She will return for a Port-A-Cath flush in 6 weeks and an office visit in 3 months.  She will call for new symptoms. The firm smooth nodular area in the left growing is likely a postoperative seroma.  Betsy Coder, MD  02/10/2022  11:44 AM

## 2022-02-11 ENCOUNTER — Other Ambulatory Visit: Payer: Self-pay

## 2022-02-15 ENCOUNTER — Other Ambulatory Visit: Payer: Self-pay

## 2022-02-23 ENCOUNTER — Encounter: Payer: Self-pay | Admitting: Cardiology

## 2022-02-23 ENCOUNTER — Ambulatory Visit: Payer: Medicare HMO | Attending: Cardiology | Admitting: Cardiology

## 2022-02-23 VITALS — BP 100/60 | HR 98 | Ht 67.0 in | Wt 204.6 lb

## 2022-02-23 DIAGNOSIS — D469 Myelodysplastic syndrome, unspecified: Secondary | ICD-10-CM | POA: Diagnosis not present

## 2022-02-23 DIAGNOSIS — D6869 Other thrombophilia: Secondary | ICD-10-CM | POA: Diagnosis not present

## 2022-02-23 DIAGNOSIS — I4821 Permanent atrial fibrillation: Secondary | ICD-10-CM | POA: Diagnosis not present

## 2022-02-23 DIAGNOSIS — N1832 Chronic kidney disease, stage 3b: Secondary | ICD-10-CM | POA: Diagnosis not present

## 2022-02-23 MED ORDER — METOPROLOL TARTRATE 50 MG PO TABS: 50.0000 mg | ORAL_TABLET | Freq: Two times a day (BID) | ORAL | 3 refills | Status: DC

## 2022-02-23 NOTE — Progress Notes (Signed)
Cardiology Office Note:    Date:  02/23/2022   ID:  [name] [name], DOB [birth year], MRN [id number]  PCP:  Harlan Stains, MD   [facility] Providers Cardiologist:  Candee Furbish, MD Electrophysiologist:  Vickie Epley, MD     Referring MD: Harlan Stains, MD     History of Present Illness:    [name] [name] is a 77 y.o. female here for the follow-up of atrial fibrillation/ flutter.  Unsuccessful cardioversion on 02/06/2018 after 3 shocks.  She was seen by Dr. Lars Mage with electrophysiology.  In April 2023 she was planned to restart amiodarone.  Given her history of MDS discussion for left atrial appendage closure as a mechanism to avoid long-term anticoagulation was discussed.  She had also been diagnosed with myeloma and received R-CHOP chemo for large B-cell lymphoma.  Amiodarone was ultimately discontinued on 02/09/2022.  Rate control.  She was placed on metoprolol 50 mg twice a day.  Heart rates are still high.  We are increasing to 100 mg twice a day on 02/23/2022.  Decreased energy, shortness of breath with exertion has been chronic and longstanding.  She also sometimes feels some weakness in her legs when she is walking.  She has to stand for a few seconds before she gets moving.  She has had nerve conduction studies which were reassuring and normal.  She is followed by oncology closely for her large B-cell lymphoma.  She remembers having significant night sweats.  She now states that she is having some hot flashes.  She states that she did not have any hot flashes during menopause.  She will discuss this with oncology.   Past Medical History:  Diagnosis Date   (HFpEF) heart failure with preserved ejection fraction ([place]) 07/06/2016   A-fib ([place])    paroxysmal   Acquired thrombophilia ([place]) 09/03/2021   Acute bronchitis 06/21/2020   Anemia 08/02/2020   Hgb 13.7-> 10.7 between 10/2019 and 07/2020   Anxiety    Arthritis    Benign neoplasm of colon 12/29/2011   Bilateral  carpal tunnel syndrome 11/06/2015   Cataracts, bilateral    immature   Cervical spondylosis without myelopathy 11/06/2015   Chest pain 07/06/2016   Chronic cough    Chronic kidney disease, stage 3 unspecified ([place]) 03/26/2021   Chronic rhinitis 06/16/2017   CKD (chronic kidney disease) stage 3, GFR 30-59 ml/min (HCC)    Depression    Disequilibrium 08/01/2019   Drug-induced hypothyroidism 03/26/2021   Dyspnea on exertion 04/03/2016   PFT 08/02/20-mild obstruction, no resp to BD, mild reduction DLCO   Essential hypertension    Fatigue 07/06/2016   Gait instability 08/01/2019   Gastroesophageal reflux disease without esophagitis    Generalized anxiety disorder 11/25/2016   Generalized weakness 08/28/2021   Hardening of the aorta (main artery of the heart) ([place]) 03/26/2021   Hemorrhoid    History of bronchitis    couple of yrs ago   History of peristent atrial fibrillation    Hoarseness 05/12/2017   Hx of colonic polyps 05/24/2014   Hyperlipidemia    takes Pravastatin daily   Hypothyroidism 04/04/2018   Long term current use of amiodarone 10/19/2017   Loss of appetite 09/03/2021   Mitral regurgitation 02/27/2016   Moderate recurrent major depression ([place]) 09/03/2021   NHL (non-Hodgkin's lymphoma) ([place]) 09/03/2021   OSA (obstructive sleep apnea) 10/19/2017   HST 09/24/2018- AHI 30.5/ hr, desaturation to 76%, body weight 249.6 lbs Unable to tolerate CPAP, failed twice.  Other specified disorders of bone density and structure, other site 09/03/2021   PAF (paroxysmal atrial fibrillation) ([place]) 02/17/2016   Pancytopenia ([place]) 08/29/2021   Persistent atrial fibrillation ([place]) 10/31/2016   Prediabetes 09/03/2021   Primary localized osteoarthritis of left knee 09/21/2016   Primary osteoarthritis of both first carpometacarpal joints 11/06/2015   Pruritus 10/31/2016   Rectocele 09/03/2021   Rhinitis, allergic 03/19/2017   S/P lumbar laminectomy 04/05/2013   Secondary hypercoagulable state ([place]) 04/28/2021    Sensorineural hearing loss (SNHL) of both ears 08/01/2019   Severe recurrent major depression without psychotic features ([place]) 11/25/2016   Splenomegaly 08/20/2021   Syncope 09/03/2021   Upper airway cough syndrome 03/19/2017   Urinary urgency     Past Surgical History:  Procedure Laterality Date   ABDOMINAL HYSTERECTOMY  1980   partial   ABDOMINAL HYSTERECTOMY     partial   BACK SURGERY     CARDIOVERSION N/A 02/24/2016   Procedure: CARDIOVERSION;  Surgeon: Fay Records, MD;  Location: [place];  Service: Cardiovascular;  Laterality: N/A;   CARDIOVERSION N/A 03/19/2016   Procedure: CARDIOVERSION;  Surgeon: Fay Records, MD;  Location: [place];  Service: Cardiovascular;  Laterality: N/A;   CARDIOVERSION N/A 05/15/2021   Procedure: CARDIOVERSION;  Surgeon: Pixie Casino, MD;  Location: [facility] ENDOSCOPY;  Service: Cardiovascular;  Laterality: N/A;   CARDIOVERSION N/A 02/06/2022   Procedure: CARDIOVERSION;  Surgeon: [name], MD;  Location: [facility] ENDOSCOPY;  Service: Cardiovascular;  Laterality: N/A;   [place]     COLONOSCOPY     COLONOSCOPY WITH PROPOFOL  12/29/2011   Procedure: COLONOSCOPY WITH PROPOFOL;  Surgeon: Lear Ng, MD;  Location: [facility] ENDOSCOPY;  Service: Endoscopy;  Laterality: N/A;   COLONOSCOPY WITH PROPOFOL N/A 05/24/2014   Procedure: COLONOSCOPY WITH PROPOFOL;  Surgeon: Wilford Corner, MD;  Location: [facility] ENDOSCOPY;  Service: Endoscopy;  Laterality: N/A;   ESOPHAGEAL MANOMETRY N/A 02/22/2017   Procedure: ESOPHAGEAL MANOMETRY (EM);  Surgeon: Wilford Corner, MD;  Location: [facility] ENDOSCOPY;  Service: Endoscopy;  Laterality: N/A;   ESOPHAGOGASTRODUODENOSCOPY     FOOT SURGERY     left, right   FRACTURE SURGERY     left leg-knee   HEEL SPUR EXCISION Bilateral    HOT HEMOSTASIS  12/29/2011   Procedure: HOT HEMOSTASIS (ARGON PLASMA COAGULATION/BICAP);  Surgeon: Lear Ng, MD;  Location: [facility];   Service: Endoscopy;  Laterality: N/A;   HOT HEMOSTASIS N/A 05/24/2014   Procedure: HOT HEMOSTASIS (ARGON PLASMA COAGULATION/BICAP);  Surgeon: Wilford Corner, MD;  Location: [facility] ENDOSCOPY;  Service: Endoscopy;  Laterality: N/A;   IR BONE MARROW BIOPSY & ASPIRATION  08/13/2021   IR IMAGING GUIDED PORT INSERTION  09/09/2021   leg surgery d/t break Left    LUMBAR LAMINECTOMY/DECOMPRESSION MICRODISCECTOMY Left 04/05/2013   Procedure: LUMBAR FOUR TO FIVE LUMBAR LAMINECTOMY/DECOMPRESSION MICRODISCECTOMY 1 LEVEL;  Surgeon: Eustace Moore, MD;  Location: [place] NEURO ORS;  Service: Neurosurgery;  Laterality: Left;   LYMPH NODE BIOPSY Left 08/30/2021   Procedure: LYMPH NODE BIOPSY;  Surgeon: Jovita Kussmaul, MD;  Location: [facility] ORS;  Service: General;  Laterality: Left;   NISSEN FUNDOPLICATION     [place] IMPEDANCE STUDY N/A 02/22/2017   Procedure: [place] IMPEDANCE STUDY;  Surgeon: Wilford Corner, MD;  Location: [facility] ENDOSCOPY;  Service: Endoscopy;  Laterality: N/A;   TEE WITHOUT CARDIOVERSION N/A 02/24/2016   Procedure: TRANSESOPHAGEAL ECHOCARDIOGRAM (TEE);  Surgeon: Fay Records, MD;  Location: [place];  Service: Cardiovascular;  Laterality: N/A;   TOTAL KNEE ARTHROPLASTY Left 10/07/2016   TOTAL KNEE ARTHROPLASTY Left 10/07/2016   Procedure: TOTAL KNEE ARTHROPLASTY;  Surgeon: [name], MD;  Location: [place];  Service: Orthopedics;  Laterality: Left;    Current Medications: Current Meds  Medication Sig   acetaminophen (TYLENOL) 500 MG tablet Take 1,000 mg by mouth every 6 (six) hours as needed for moderate pain.   albuterol (VENTOLIN HFA) 108 (90 Base) MCG/ACT inhaler Inhale 1-2 puffs into the lungs every 4 (four) hours as needed for wheezing or shortness of breath.   ALPRAZolam (XANAX) 0.5 MG tablet Take 1 tablet (0.5 mg total) by mouth 3 (three) times daily as needed for anxiety.   apixaban (ELIQUIS) 5 MG TABS tablet Take 1 tablet (5 mg total) by mouth 2 (two) times daily.   benzonatate (TESSALON) 200 MG  capsule Take 1 capsule by mouth three times daily as needed for cough   buPROPion (WELLBUTRIN XL) 300 MG 24 hr tablet Take 150 mg by mouth daily.   busPIRone (BUSPAR) 10 MG tablet Take 10 mg by mouth 2 (two) times daily.   Calcium 250 MG CAPS Take 250 mg by mouth at bedtime.   famotidine (PEPCID) 20 MG tablet Take 20 mg by mouth daily as needed for heartburn or indigestion.   fexofenadine (ALLEGRA) 180 MG tablet Take 180 mg by mouth daily.   furosemide (LASIX) 20 MG tablet Take 1 tablet (20 mg total) by mouth daily as needed for edema or fluid.   levothyroxine (SYNTHROID) 75 MCG tablet TAKE 1 TABLET DAILY BEFORE BREAKFAST (NEED APPOINTMENT FOR FURTHER REFILLS)   lidocaine-prilocaine (EMLA) cream Apply to Port-A-Cath site 1 to 2 hours prior to use   Magnesium 250 MG TABS Take 250 mg by mouth daily.   metoprolol tartrate (LOPRESSOR) 50 MG tablet Take 1 tablet (50 mg total) by mouth 2 (two) times daily.   polyethylene glycol (MIRALAX) 17 g packet Take 17 g by mouth daily.   potassium chloride SA (KLOR-CON M) 20 MEQ tablet Take 1 tablet (20 mEq total) by mouth daily as needed (Take potassium only when you take the Furosemide (Lasix)).   pravastatin (PRAVACHOL) 40 MG tablet Take 40 mg by mouth at bedtime.   Spacer/Aero-Holding Chambers (AEROCHAMBER MV) inhaler Use as instructed   [DISCONTINUED] metoprolol succinate (TOPROL-XL) 25 MG 24 hr tablet Take 2 tablets by mouth at bedtime. (50 mg Total)     Allergies:   Omnicef [cefdinir], Rituxan [rituximab], Bacitracin, Bacitracin-polymyxin b, Duloxetine hcl, Lexapro [escitalopram], Mirtazapine, Penicillins, Peroxide [hydrogen peroxide], Zoloft [sertraline hcl], and Neosporin [neomycin-bacitracin zn-polymyx]   Social History   Socioeconomic History   Marital status: Married    Spouse name: Dominica Severin   Number of children: 2   Years of education: 12   Highest education level: Some college, no degree  Occupational History   Occupation: retired  Tobacco Use    Smoking status: Never   Smokeless tobacco: Never   Tobacco comments:    Never smoke 05/22/21  Vaping Use   Vaping Use: Never used  Substance and Sexual Activity   Alcohol use: No   Drug use: No   Sexual activity: Yes    Birth control/protection: Surgical  Other Topics Concern   Not on file  Social History Narrative   Right handed   Drinks caffeine   Two story home   Social Determinants of Health   Financial Resource Strain: Low Risk  (11/25/2016)  Overall Financial Resource Strain (CARDIA)    Difficulty of Paying Living Expenses: Not very hard  Food Insecurity: Unknown (11/25/2016)   Hunger Vital Sign    Worried About Running Out of Food in the Last Year: Patient refused    [place] in the Last Year: Patient refused  Transportation Needs: Unknown (11/25/2016)   [place] - Hydrologist (Medical): Patient refused    Lack of Transportation (Non-Medical): Patient refused  Physical Activity: Unknown (11/25/2016)   Exercise Vital Sign    Days of Exercise per Week: Patient refused    Minutes of Exercise per Session: Patient refused  Stress: Stress Concern Present (11/25/2016)   [place]    Feeling of Stress : Very much  Social Connections: Moderately Integrated (11/25/2016)   Social Connection and Isolation Panel [NHANES]    Frequency of Communication with Friends and Family: Twice a week    Frequency of Social Gatherings with Friends and Family: Twice a week    Attends Religious Services: More than 4 times per year    Active Member of [employer] or Organizations: No    Attends Archivist Meetings: Never    Marital Status: Married     Family History: The patient's family history includes Colon cancer in her mother; Heart disease in her father. There is no history of Breast cancer or Thyroid disease.  ROS:   Please see the history of present illness.     All  other systems reviewed and are negative.  EKGs/Labs/Other Studies Reviewed:    The following studies were reviewed today:  ECG: 02/09/22: Atypical atrial flutter with a ventricular rate of 106 bpm.   ECHO 2023:   1. Left ventricular ejection fraction, by estimation, is 60 to 65%. The  left ventricle has normal function. The left ventricle has no regional  wall motion abnormalities. Left ventricular diastolic parameters were  normal.   2. Right ventricular systolic function is normal. The right ventricular  size is normal. There is moderately elevated pulmonary artery systolic  pressure.   3. Left atrial size was moderately dilated.   4. The mitral valve is degenerative. Mild mitral valve regurgitation. No  evidence of mitral stenosis.   5. The aortic valve is tricuspid. Aortic valve regurgitation is not  visualized. No aortic stenosis is present.   6. The inferior vena cava is dilated in size with >50% respiratory  variability, suggesting right atrial pressure of 8 mmHg.    Recent Labs: 09/03/2021: TSH 0.149 01/08/2022: Magnesium 1.8 02/10/2022: ALT 9; BUN 22; Creatinine 1.51; Hemoglobin 12.1; Platelet Count 157; Potassium 4.0; Sodium 143  Recent Lipid Panel    Component Value Date/Time   CHOL 125 02/18/2016 0454   TRIG 90 02/18/2016 0454   HDL 41 02/18/2016 0454   CHOLHDL 3.0 02/18/2016 0454   VLDL 18 02/18/2016 0454   LDLCALC 66 02/18/2016 0454     Risk Assessment/Calculations:      Physical Exam:    VS:  BP 100/60   Pulse 98   Ht 5' 7"$  (1.702 m)   Wt 204 lb 9.6 oz (92.8 kg)   BMI 32.04 kg/m     Wt Readings from Last 3 Encounters:  02/23/22 204 lb 9.6 oz (92.8 kg)  02/10/22 203 lb (92.1 kg)  02/09/22 205 lb (93 kg)     GEN:  Well nourished, well developed in no acute distress HEENT: Normal NECK: No JVD; No  carotid bruits LYMPHATICS: No lymphadenopathy CARDIAC: RRR, no murmurs, rubs, gallops RESPIRATORY:  Clear to auscultation without rales, wheezing or  rhonchi  ABDOMEN: Soft, non-tender, non-distended MUSCULOSKELETAL:  No edema; No deformity  SKIN: Warm and dry NEUROLOGIC:  Alert and oriented x 3 PSYCHIATRIC:  Normal affect   ASSESSMENT:    1. Permanent atrial fibrillation ([place])   2. Hypercoagulable state due to persistent atrial fibrillation ([place])   3. Myelodysplastic syndrome ([place])     PLAN:    In order of problems listed above:  Permanent atrial fibrillation -Continue with rate control strategy.  Appreciate Dr. Mardene Speak recent evaluation.  We will increase her total amount of metoprolol to 100 mg splitting it into metoprolol to tartrate 50 mg twice a day.  Hopefully this will help her improve her rate control throughout the day.  Discussed rate control strategy at length for her.  Chronic anticoagulation - On Eliquis.  She did have a fall previously in the [place] parking lot.  Thankfully no broken bones or no evidence of brain injury.  Had some right periorbital ecchymosis at that time.  Hemoglobin has been stable, last 12.1.  She really was hopeful that she would be able to get the Watchman device but given her underlying comorbidities understand.  Dyspnea on exertion - This has been longstanding followed by pulmonary.  Seems to be doing fairly reasonable.  She does use an inhaler occasionally when she feels some wheezing.  She states that she does need to follow-up with the pulmonologist.   B cell lymphoma -Followed by oncology.  Essential hypertension - Continuing with blood pressure medications.  Mitral regurgitation - Previously mild to moderate on 2020 echocardiogram reviewed.  This has been stable.      Medication Adjustments/Labs and Tests Ordered: Current medicines are reviewed at length with the patient today.  Concerns regarding medicines are outlined above.  No orders of the defined types were placed in this encounter.  Meds ordered this encounter  Medications   metoprolol tartrate (LOPRESSOR) 50 MG  tablet    Sig: Take 1 tablet (50 mg total) by mouth 2 (two) times daily.    Dispense:  180 tablet    Refill:  3    Patient Instructions  Medication Instructions:  Please discontinue Metoprolol Succinate and start Metoprolol Tartrate 50 mg - one tablet twice a day. Continue all other medications as listed.  *If you need a refill on your cardiac medications before your next appointment, please call your pharmacy*  Follow-Up: At [facility], you and your health needs are our priority.  As part of our continuing mission to provide you with exceptional heart care, we have created designated Provider Care Teams.  These Care Teams include your primary Cardiologist (physician) and Advanced Practice Providers (APPs -  Physician Assistants and Nurse Practitioners) who all work together to provide you with the care you need, when you need it.  We recommend signing up for the patient portal called "MyChart".  Sign up information is provided on this After Visit Summary.  MyChart is used to connect with patients for Virtual Visits (Telemedicine).  Patients are able to view lab/test results, encounter notes, upcoming appointments, etc.  Non-urgent messages can be sent to your provider as well.   To learn more about what you can do with MyChart, go to [email or web address].    Your next appointment:   6 month(s)  Provider:   Candee Furbish, MD        Signed, Candee Furbish, MD  02/23/2022 11:03 AM    [facility]

## 2022-02-23 NOTE — Patient Instructions (Signed)
Medication Instructions:  Please discontinue Metoprolol Succinate and start Metoprolol Tartrate 50 mg - one tablet twice a day. Continue all other medications as listed.  *If you need a refill on your cardiac medications before your next appointment, please call your pharmacy*  Follow-Up: At [facility], you and your health needs are our priority.  As part of our continuing mission to provide you with exceptional heart care, we have created designated Provider Care Teams.  These Care Teams include your primary Cardiologist (physician) and Advanced Practice Providers (APPs -  Physician Assistants and Nurse Practitioners) who all work together to provide you with the care you need, when you need it.  We recommend signing up for the patient portal called "MyChart".  Sign up information is provided on this After Visit Summary.  MyChart is used to connect with patients for Virtual Visits (Telemedicine).  Patients are able to view lab/test results, encounter notes, upcoming appointments, etc.  Non-urgent messages can be sent to your provider as well.   To learn more about what you can do with MyChart, go to [email or web address].    Your next appointment:   6 month(s)  Provider:   Candee Furbish, MD

## 2022-02-24 ENCOUNTER — Other Ambulatory Visit: Payer: Self-pay

## 2022-03-03 ENCOUNTER — Ambulatory Visit (INDEPENDENT_AMBULATORY_CARE_PROVIDER_SITE_OTHER): Payer: Medicare HMO

## 2022-03-03 ENCOUNTER — Ambulatory Visit: Payer: Medicare HMO | Admitting: Podiatry

## 2022-03-03 DIAGNOSIS — M19071 Primary osteoarthritis, right ankle and foot: Secondary | ICD-10-CM

## 2022-03-03 DIAGNOSIS — G620 Drug-induced polyneuropathy: Secondary | ICD-10-CM | POA: Diagnosis not present

## 2022-03-03 DIAGNOSIS — M19072 Primary osteoarthritis, left ankle and foot: Secondary | ICD-10-CM

## 2022-03-03 MED ORDER — GABAPENTIN 300 MG PO CAPS: 300.0000 mg | ORAL_CAPSULE | Freq: Every day | ORAL | 3 refills | Status: DC

## 2022-03-03 NOTE — Patient Instructions (Addendum)
A carbon fiber insert in the shoes will help to stiffen up the foot and shoe so it doesn't have as much motion or pain   Look for Voltaren gel at the pharmacy over the counter or online (also known as diclofenac 1% gel). Apply to the painful areas 3-4x daily with the supplied dosing card. Allow to dry for 10 minutes before going into socks/shoes

## 2022-03-05 DIAGNOSIS — N1832 Chronic kidney disease, stage 3b: Secondary | ICD-10-CM | POA: Diagnosis not present

## 2022-03-05 DIAGNOSIS — I129 Hypertensive chronic kidney disease with stage 1 through stage 4 chronic kidney disease, or unspecified chronic kidney disease: Secondary | ICD-10-CM | POA: Diagnosis not present

## 2022-03-05 DIAGNOSIS — D631 Anemia in chronic kidney disease: Secondary | ICD-10-CM | POA: Diagnosis not present

## 2022-03-05 DIAGNOSIS — N2581 Secondary hyperparathyroidism of renal origin: Secondary | ICD-10-CM | POA: Diagnosis not present

## 2022-03-07 NOTE — Progress Notes (Signed)
  Subjective:  Patient ID: [name] [name], female    DOB: 1945-06-08,  MRN: [id number]  Chief Complaint  Patient presents with   Difficulty Walking    NP- Feet hurt walking. Bottoms of feet burn at times, tops of both feet hurt when she walks **She is [name] [name]'s grandmother    77 y.o. female presents with the above complaint. History confirmed with patient.  She previously underwent chemotherapy, has completed treatment  Objective:  Physical Exam: warm, good capillary refill, no trophic changes or ulcerative lesions, normal DP and PT pulses, and paresthesias and some numbness in the tips of the toes, she has palpable dorsal spurring in the midtarsal joint with pain here   Radiographs of both feet show osteoarthritis of the midtarsal joint and TMTJ's Assessment:   1. 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 Peripheral neuropathy due to and not concurrent with chemotherapy [facility])      Plan:  Patient was evaluated and treated and all questions answered.  We discussed etiology and treatment options of both midfoot arthritis as well as peripheral neuropathy.  Suspect likely her peripheral neuropathy is secondary to chemotherapy treatment.  Hopefully does improve and resolve the further she gets away from her treatment.  We discussed treatment of this with gabapentin.  Discussed possible side effects use and administration.  Rx for 3 mg 3 times daily was sent to pharmacy.  Begin with nightly dose.  We discussed shoe gear and supportive arch supports for the osteoarthritis.  She will return if this worsens for corticosteroid injection.  Return if symptoms worsen or fail to improve.

## 2022-03-09 ENCOUNTER — Ambulatory Visit (HOSPITAL_COMMUNITY)
Admission: RE | Admit: 2022-03-09 | Discharge: 2022-03-09 | Disposition: A | Discharge status: Home / Self Care | Payer: Medicare HMO | Source: Ambulatory Visit | Attending: Physician Assistant | Admitting: Physician Assistant

## 2022-03-09 VITALS — BP 140/76 | HR 91 | Ht 67.0 in | Wt 211.4 lb

## 2022-03-09 DIAGNOSIS — I5032 Chronic diastolic (congestive) heart failure: Secondary | ICD-10-CM | POA: Diagnosis not present

## 2022-03-09 DIAGNOSIS — I4821 Permanent atrial fibrillation: Secondary | ICD-10-CM | POA: Diagnosis not present

## 2022-03-09 DIAGNOSIS — D6869 Other thrombophilia: Secondary | ICD-10-CM

## 2022-03-09 DIAGNOSIS — E785 Hyperlipidemia, unspecified: Secondary | ICD-10-CM | POA: Diagnosis not present

## 2022-03-09 DIAGNOSIS — I130 Hypertensive heart and chronic kidney disease with heart failure and stage 1 through stage 4 chronic kidney disease, or unspecified chronic kidney disease: Secondary | ICD-10-CM | POA: Insufficient documentation

## 2022-03-09 DIAGNOSIS — Z7901 Long term (current) use of anticoagulants: Secondary | ICD-10-CM | POA: Diagnosis not present

## 2022-03-09 DIAGNOSIS — N183 Chronic kidney disease, stage 3 unspecified: Secondary | ICD-10-CM | POA: Insufficient documentation

## 2022-03-09 DIAGNOSIS — Z79899 Other long term (current) drug therapy: Secondary | ICD-10-CM | POA: Diagnosis not present

## 2022-03-09 DIAGNOSIS — G4733 Obstructive sleep apnea (adult) (pediatric): Secondary | ICD-10-CM | POA: Insufficient documentation

## 2022-03-09 NOTE — Progress Notes (Signed)
Primary Care Physician: Harlan Stains, MD Primary Cardiologist: Dr Marlou Porch Primary Electrophysiologist: Dr Quentin Ore Referring Physician: Dr Venetia Maxon is a 77 y.o. female with a history of CKD, HTN, HLD, MDS, OSA, non-Hodgkin's lymphoma, atrial fibrillation who presents for follow up in the [facility]. The patient was initially diagnosed with atrial fibrillation remotely and had been maintained on amiodarone. This was discontinued for possible side effects. It was later felt that these symptoms were due to possible MDS and not amiodarone. The medication was resumed 04/18/21. She has had several ED visits for rapid afib requiring DCCV, most recently 04/20/21. She was back in afib the very next day. She has symptoms of fatigue and lightheadedness while in afib. Patient is on Eliquis for a CHADS2VASC score of 6. Patient is s/p DCCV on 05/15/21. She was recently diagnosed with non-Hodgkin's lymphoma and is in remission.   On follow up today, patient is s/p DCCV on 02/06/22 which was unsuccessful after 3 shocks. Seen by Dr Quentin Ore who felt her afib is now permanent, amiodarone discontinued and rate control increased. She reports that her heart rates at home are between 80s-100s bpm. She is unaware of her arrhythmia.   Today, she denies symptoms of palpitations, chest pain, shortness of breath, orthopnea, PND, lower extremity edema, presyncope, syncope, bleeding, or neurologic sequela. The patient is tolerating medications without difficulties and is otherwise without complaint today.    Atrial Fibrillation Risk Factors:  she does have symptoms or diagnosis of sleep apnea. she is not compliant with CPAP therapy. she does not have a history of rheumatic fever.   she has a BMI of Body mass index is 33.11 kg/m.Marland Kitchen Filed Weights   03/09/22 0929  Weight: 95.9 kg    Family History  Problem Relation Age of Onset   Colon cancer Mother    Heart disease Father     Breast cancer Neg Hx    Thyroid disease Neg Hx      Atrial Fibrillation Management history:  Previous antiarrhythmic drugs: amiodarone  Previous cardioversions: 2018 x 2, 03/22/21, 04/07/21, 04/20/21, 05/15/21, 02/06/22 Previous ablations: none CHADS2VASC score: 6 Anticoagulation history: Eliquis   Past Medical History:  Diagnosis Date   (HFpEF) heart failure with preserved ejection fraction ([place]) 07/06/2016   A-fib ([place])    paroxysmal   Acquired thrombophilia ([place]) 09/03/2021   Acute bronchitis 06/21/2020   Anemia 08/02/2020   Hgb 13.7-> 10.7 between 10/2019 and 07/2020   Anxiety    Arthritis    Benign neoplasm of colon 12/29/2011   Bilateral carpal tunnel syndrome 11/06/2015   Cataracts, bilateral    immature   Cervical spondylosis without myelopathy 11/06/2015   Chest pain 07/06/2016   Chronic cough    Chronic kidney disease, stage 3 unspecified ([place]) 03/26/2021   Chronic rhinitis 06/16/2017   CKD (chronic kidney disease) stage 3, GFR 30-59 ml/min (HCC)    Depression    Disequilibrium 08/01/2019   Drug-induced hypothyroidism 03/26/2021   Dyspnea on exertion 04/03/2016   PFT 08/02/20-mild obstruction, no resp to BD, mild reduction DLCO   Essential hypertension    Fatigue 07/06/2016   Gait instability 08/01/2019   Gastroesophageal reflux disease without esophagitis    Generalized anxiety disorder 11/25/2016   Generalized weakness 08/28/2021   Hardening of the aorta (main artery of the heart) ([place]) 03/26/2021   Hemorrhoid    History of bronchitis    couple of yrs ago   History of peristent atrial fibrillation  Hoarseness 05/12/2017   Hx of colonic polyps 05/24/2014   Hyperlipidemia    takes Pravastatin daily   Hypothyroidism 04/04/2018   Long term current use of amiodarone 10/19/2017   Loss of appetite 09/03/2021   Mitral regurgitation 02/27/2016   Moderate recurrent major depression (HCC) 09/03/2021   NHL (non-Hodgkin's lymphoma) ([place]) 09/03/2021   OSA (obstructive sleep apnea)  10/19/2017   HST 09/24/2018- AHI 30.5/ hr, desaturation to 76%, body weight 249.6 lbs Unable to tolerate CPAP, failed twice.   Other specified disorders of bone density and structure, other site 09/03/2021   PAF (paroxysmal atrial fibrillation) ([place]) 02/17/2016   Pancytopenia ([place]) 08/29/2021   Persistent atrial fibrillation ([place]) 10/31/2016   Prediabetes 09/03/2021   Primary localized osteoarthritis of left knee 09/21/2016   Primary osteoarthritis of both first carpometacarpal joints 11/06/2015   Pruritus 10/31/2016   Rectocele 09/03/2021   Rhinitis, allergic 03/19/2017   S/P lumbar laminectomy 04/05/2013   Secondary hypercoagulable state ([place]) 04/28/2021   Sensorineural hearing loss (SNHL) of both ears 08/01/2019   Severe recurrent major depression without psychotic features ([place]) 11/25/2016   Splenomegaly 08/20/2021   Syncope 09/03/2021   Upper airway cough syndrome 03/19/2017   Urinary urgency    Past Surgical History:  Procedure Laterality Date   ABDOMINAL HYSTERECTOMY  1980   partial   ABDOMINAL HYSTERECTOMY     partial   BACK SURGERY     CARDIOVERSION N/A 02/24/2016   Procedure: CARDIOVERSION;  Surgeon: Fay Records, MD;  Location: [facility];  Service: Cardiovascular;  Laterality: N/A;   CARDIOVERSION N/A 03/19/2016   Procedure: CARDIOVERSION;  Surgeon: Fay Records, MD;  Location: [place];  Service: Cardiovascular;  Laterality: N/A;   CARDIOVERSION N/A 05/15/2021   Procedure: CARDIOVERSION;  Surgeon: Pixie Casino, MD;  Location: [facility] ENDOSCOPY;  Service: Cardiovascular;  Laterality: N/A;   CARDIOVERSION N/A 02/06/2022   Procedure: CARDIOVERSION;  Surgeon: [name], MD;  Location: [facility] ENDOSCOPY;  Service: Cardiovascular;  Laterality: N/A;   [place]     COLONOSCOPY     COLONOSCOPY WITH PROPOFOL  12/29/2011   Procedure: COLONOSCOPY WITH PROPOFOL;  Surgeon: Lear Ng, MD;  Location: [facility] ENDOSCOPY;  Service:  Endoscopy;  Laterality: N/A;   COLONOSCOPY WITH PROPOFOL N/A 05/24/2014   Procedure: COLONOSCOPY WITH PROPOFOL;  Surgeon: Wilford Corner, MD;  Location: [facility] ENDOSCOPY;  Service: Endoscopy;  Laterality: N/A;   ESOPHAGEAL MANOMETRY N/A 02/22/2017   Procedure: ESOPHAGEAL MANOMETRY (EM);  Surgeon: Wilford Corner, MD;  Location: [facility] ENDOSCOPY;  Service: Endoscopy;  Laterality: N/A;   ESOPHAGOGASTRODUODENOSCOPY     FOOT SURGERY     left, right   FRACTURE SURGERY     left leg-knee   HEEL SPUR EXCISION Bilateral    HOT HEMOSTASIS  12/29/2011   Procedure: HOT HEMOSTASIS (ARGON PLASMA COAGULATION/BICAP);  Surgeon: Lear Ng, MD;  Location: [facility]OS[facility];  Service: Endoscopy;  Laterality: N/A;   HOT HEMOSTASIS N/A 05/24/2014   Procedure: HOT HEMOSTASIS (ARGON PLASMA COAGULATION/BICAP);  Surgeon: Wilford Corner, MD;  Location: [facility] ENDOSCOPY;  Service: Endoscopy;  Laterality: N/A;   IR BONE MARROW BIOPSY & ASPIRATION  08/13/2021   IR IMAGING GUIDED PORT INSERTION  09/09/2021   leg surgery d/t break Left    LUMBAR LAMINECTOMY/DECOMPRESSION MICRODISCECTOMY Left 04/05/2013   Procedure: LUMBAR FOUR TO FIVE LUMBAR LAMINECTOMY/DECOMPRESSION MICRODISCECTOMY 1 LEVEL;  Surgeon: Eustace Moore, MD;  Location: [facility] NEURO ORS;  Service: Neurosurgery;  Laterality: Left;   LYMPH NODE BIOPSY Left 08/30/2021   Procedure: LYMPH NODE BIOPSY;  Surgeon: Jovita Kussmaul, MD;  Location: [facility] ORS;  Service: General;  Laterality: Left;   NISSEN FUNDOPLICATION     [place] IMPEDANCE STUDY N/A 02/22/2017   Procedure: [place] IMPEDANCE STUDY;  Surgeon: Wilford Corner, MD;  Location: [facility] ENDOSCOPY;  Service: Endoscopy;  Laterality: N/A;   TEE WITHOUT CARDIOVERSION N/A 02/24/2016   Procedure: TRANSESOPHAGEAL ECHOCARDIOGRAM (TEE);  Surgeon: Fay Records, MD;  Location: [facility];  Service: Cardiovascular;  Laterality: N/A;   TOTAL KNEE ARTHROPLASTY Left 10/07/2016   TOTAL KNEE ARTHROPLASTY Left 10/07/2016   Procedure: TOTAL KNEE  ARTHROPLASTY;  Surgeon: [name], MD;  Location: [place];  Service: Orthopedics;  Laterality: Left;    Current Outpatient Medications  Medication Sig Dispense Refill   acetaminophen (TYLENOL) 500 MG tablet Take 1,000 mg by mouth every 6 (six) hours as needed for moderate pain.     albuterol (VENTOLIN HFA) 108 (90 Base) MCG/ACT inhaler Inhale 1-2 puffs into the lungs every 4 (four) hours as needed for wheezing or shortness of breath. 1 g 1   ALPRAZolam (XANAX) 0.5 MG tablet Take 1 tablet (0.5 mg total) by mouth 3 (three) times daily as needed for anxiety. 40 tablet 0   apixaban (ELIQUIS) 5 MG TABS tablet Take 1 tablet (5 mg total) by mouth 2 (two) times daily.     benzonatate (TESSALON) 200 MG capsule Take 1 capsule by mouth three times daily as needed for cough 60 capsule 0   buPROPion (WELLBUTRIN XL) 300 MG 24 hr tablet Take 150 mg by mouth daily.     busPIRone (BUSPAR) 10 MG tablet Take 10 mg by mouth 2 (two) times daily.     Calcium 250 MG CAPS Take 250 mg by mouth at bedtime.     dextromethorphan (DELSYM) 30 MG/5ML liquid Take by mouth as needed.     dextromethorphan-guaiFENesin (MUCINEX DM) 30-600 MG 12hr tablet Take 1 tablet by mouth as needed for cough.     famotidine (PEPCID) 20 MG tablet Take 20 mg by mouth daily as needed for heartburn or indigestion.     fexofenadine (ALLEGRA) 180 MG tablet Take 180 mg by mouth daily.     furosemide (LASIX) 20 MG tablet Take 1 tablet (20 mg total) by mouth daily as needed for edema or fluid. 90 tablet 3   gabapentin (NEURONTIN) 300 MG capsule Take 1 capsule (300 mg total) by mouth at bedtime. 90 capsule 3   levothyroxine (SYNTHROID) 75 MCG tablet TAKE 1 TABLET DAILY BEFORE BREAKFAST (NEED APPOINTMENT FOR FURTHER REFILLS) 90 tablet 0   lidocaine-prilocaine (EMLA) cream Apply to Port-A-Cath site 1 to 2 hours prior to use 30 g 3   Magnesium 250 MG TABS Take 250 mg by mouth daily.     metoprolol tartrate (LOPRESSOR) 50 MG tablet Take 1 tablet (50  mg total) by mouth 2 (two) times daily. 180 tablet 3   polyethylene glycol (MIRALAX) 17 g packet Take 17 g by mouth daily. 14 each 0   potassium chloride SA (KLOR-CON M) 20 MEQ tablet Take 1 tablet (20 mEq total) by mouth daily as needed (Take potassium only when you take the Furosemide (Lasix)). 90 tablet 2   pravastatin (PRAVACHOL) 40 MG tablet Take 40 mg by mouth at bedtime.     Spacer/Aero-Holding Chambers (AEROCHAMBER MV) inhaler Use as instructed 1 each 0   No current facility-administered medications for this encounter.  Facility-Administered Medications Ordered in Other Encounters  Medication Dose Route Frequency Provider Last Rate Last Admin   sodium chloride flush (NS) 0.9 % injection 10 mL  10 mL Intravenous PRN Ladell Pier, MD   10 mL at 12/01/21 [id number]    Allergies  Allergen Reactions   Omnicef [Cefdinir] Swelling    Tongue swelling   Rituxan [Rituximab] Shortness Of Breath    Patient given Pepcid '20mg'$  IV with fluids patinet reported feeling better. Treatment was restarted patient was able to tolerate the rest of the treatment with no issues.    Bacitracin     Rash   Bacitracin-Polymyxin B     Rash   Duloxetine Hcl     felt funny- thinking messed up   Lexapro [Escitalopram]     Makes her feel funny and sleepy   Mirtazapine     weight gain in high doses    Penicillins Swelling    Tongue swell   Peroxide [Hydrogen Peroxide] Other (See Comments)    Redness.    Zoloft [Sertraline Hcl]     Makes her feel funny   Neosporin [Neomycin-Bacitracin Zn-Polymyx] Rash    Blisters, itching.     Social History   Socioeconomic History   Marital status: Married    Spouse name: Dominica Severin   Number of children: 2   Years of education: 12   Highest education level: Some college, no degree  Occupational History   Occupation: retired  Tobacco Use   Smoking status: Never   Smokeless tobacco: Never   Tobacco comments:    Never smoke 05/22/21  Vaping Use   Vaping Use: Never  used  Substance and Sexual Activity   Alcohol use: No   Drug use: No   Sexual activity: Yes    Birth control/protection: Surgical  Other Topics Concern   Not on file  Social History Narrative   Right handed   Drinks caffeine   Two story home   Social Determinants of Health   Financial Resource Strain: Low Risk  (11/25/2016)   Overall Financial Resource Strain (CARDIA)    Difficulty of Paying Living Expenses: Not very hard  Food Insecurity: Unknown (11/25/2016)   Hunger Vital Sign    Worried About Running Out of Food in the Last Year: Patient refused    [place] in the Last Year: Patient refused  Transportation Needs: Unknown (11/25/2016)   PRAPARE - Transportation    Lack of Transportation (Medical): Patient refused    Lack of Transportation (Non-Medical): Patient refused  Physical Activity: Unknown (11/25/2016)   Exercise Vital Sign    Days of Exercise per Week: Patient refused    Minutes of Exercise per Session: Patient refused  Stress: Stress Concern Present (11/25/2016)   [place]    Feeling of Stress : Very much  Social Connections: Moderately Integrated (11/25/2016)   Social Connection and Isolation Panel [NHANES]    Frequency of Communication with Friends and Family: Twice a week    Frequency of Social Gatherings with Friends and Family: Twice a week    Attends Religious Services: More than 4 times per year    Active Member of [employer] or Organizations: No    Attends Archivist Meetings: Never    Marital Status: Married  Human resources officer Violence: Not At Risk (11/25/2016)   Humiliation, Afraid, Rape, and Kick questionnaire    Fear of Current or Ex-Partner: No    Emotionally Abused: No  Physically Abused: No    Sexually Abused: No    ROS- All systems are reviewed and negative except as per the HPI above.  Physical Exam: Vitals:   03/09/22 0929  BP: (!) 140/76  Pulse: 91   Weight: 95.9 kg  Height: '5\' 7"'$  (1.702 m)     GEN- The patient is a well appearing elderly female, alert and oriented x 3 today.   HEENT-head normocephalic, atraumatic, sclera clear, conjunctiva pink, hearing intact, trachea midline. Lungs- Clear to ausculation bilaterally, normal work of breathing Heart- irregular rate and rhythm, no murmurs, rubs or gallops  GI- soft, NT, ND, + BS Extremities- no clubbing, cyanosis, or edema MS- no significant deformity or atrophy Skin- no rash or lesion Psych- euthymic mood, full affect Neuro- strength and sensation are intact   Wt Readings from Last 3 Encounters:  03/09/22 95.9 kg  02/23/22 92.8 kg  02/10/22 92.1 kg    EKG today demonstrates  Afib Vent. rate 91 BPM PR interval * ms QRS duration 90 ms QT/QTcB 390/479 ms  Echo 08/09/20 demonstrated   1. Left ventricular ejection fraction, by estimation, is 60 to 65%. The  left ventricle has normal function. The left ventricle has no regional  wall motion abnormalities. Left ventricular diastolic parameters were  normal.   2. Right ventricular systolic function is normal. The right ventricular  size is normal.   3. Left atrial size was moderately dilated.   4. The mitral valve is degenerative. Mild mitral valve regurgitation. No  evidence of mitral stenosis. Moderate mitral annular calcification.   5. The aortic valve is normal in structure. Aortic valve regurgitation is  not visualized. No aortic stenosis is present.   6. The inferior vena cava is normal in size with greater than 50%  respiratory variability, suggesting right atrial pressure of 3 mmHg.   Epic records are reviewed at length today  CHA2DS2-VASc Score = 6  The patient's score is based upon: CHF History: 1 HTN History: 1 Diabetes History: 0 Stroke History: 0 Vascular Disease History: 1 (aortic atherosclerosis) Age Score: 2 Gender Score: 1       ASSESSMENT AND PLAN: 1. Permanent Atrial Fibrillation/atrial  flutter The patient's CHA2DS2-VASc score is 6, indicating a 9.7% annual risk of stroke.   S/p unsuccessful DCCV on 02/06/22 after amiodarone load, now in permanent afib. Continue Lopressor 50 mg BID Continue Eliquis 5 mg BID  2. Secondary Hypercoagulable State (ICD10:  D68.69) The patient is at significant risk for stroke/thromboembolism based upon her CHA2DS2-VASc Score of 6.  Continue Apixaban (Eliquis). Workup for Watchman implant was deferred after cancer diagnosis.   3. Obstructive sleep apnea Patient not currently on CPAP.  4. HTN Stable, no changes today.   Follow up with Dr Marlou Porch as scheduled. AF clinic in one year.    [facility] [address] [place], [place] [address] [phone] 03/09/2022 9:49 AM

## 2022-03-24 ENCOUNTER — Inpatient Hospital Stay: Payer: Medicare HMO | Attending: Oncology

## 2022-03-24 VITALS — BP 148/88 | HR 97 | Temp 97.8°F | Resp 18

## 2022-03-24 DIAGNOSIS — C8330 Diffuse large B-cell lymphoma, unspecified site: Secondary | ICD-10-CM | POA: Insufficient documentation

## 2022-03-24 DIAGNOSIS — Z95828 Presence of other vascular implants and grafts: Secondary | ICD-10-CM

## 2022-03-24 DIAGNOSIS — Z452 Encounter for adjustment and management of vascular access device: Secondary | ICD-10-CM | POA: Insufficient documentation

## 2022-03-24 MED ORDER — SODIUM CHLORIDE 0.9% FLUSH
10.0000 mL | Freq: Once | INTRAVENOUS | Status: AC
  Administered 2022-03-24: 10 mL via INTRAVENOUS

## 2022-03-24 MED ORDER — HEPARIN SOD (PORK) LOCK FLUSH 100 UNIT/ML IV SOLN
500.0000 [IU] | Freq: Once | INTRAVENOUS | Status: AC
  Administered 2022-03-24: 500 [IU] via INTRAVENOUS

## 2022-03-24 NOTE — Patient Instructions (Signed)

## 2022-03-31 ENCOUNTER — Telehealth: Payer: Self-pay | Admitting: Cardiology

## 2022-03-31 DIAGNOSIS — M159 Polyosteoarthritis, unspecified: Secondary | ICD-10-CM | POA: Diagnosis not present

## 2022-03-31 DIAGNOSIS — F331 Major depressive disorder, recurrent, moderate: Secondary | ICD-10-CM | POA: Diagnosis not present

## 2022-03-31 DIAGNOSIS — N183 Chronic kidney disease, stage 3 unspecified: Secondary | ICD-10-CM | POA: Diagnosis not present

## 2022-03-31 DIAGNOSIS — E785 Hyperlipidemia, unspecified: Secondary | ICD-10-CM | POA: Diagnosis not present

## 2022-03-31 DIAGNOSIS — E032 Hypothyroidism due to medicaments and other exogenous substances: Secondary | ICD-10-CM | POA: Diagnosis not present

## 2022-03-31 NOTE — Telephone Encounter (Signed)
[name] [name], CMA from PCP) office called on behalf of MD, Dr. Dema Severin states, "Please contact cardio office pt hr was 109 in office and pt mentioned that her hr will be normal in the morning and increase to 120's during day. Pr remains asymptomatic for afib symptoms. BP was 130/84 when in office, however pt has relayed bp is higher around 140/80's at home."

## 2022-03-31 NOTE — Telephone Encounter (Signed)
Left message for patient to call back  

## 2022-04-01 NOTE — Telephone Encounter (Signed)
Pt is returning [name] [name] call regarding her HR issue from  yesterday. Please call back.

## 2022-04-01 NOTE — Telephone Encounter (Signed)
Spoke with patient and she states she feels fine. She denies any chest pai or discomfort, heart palpitations or flutters shortness of breath, edema, headache, dizziness or fatigue. Currently heart rate is at 84. Patient will continue taking metoprolol as prescribed for rate control. She is also on Eliquis. Patient will give [place] a call if she start to have symptoms. Will forward to provider for further advise.

## 2022-04-03 DIAGNOSIS — L570 Actinic keratosis: Secondary | ICD-10-CM | POA: Diagnosis not present

## 2022-04-03 DIAGNOSIS — X32XXXD Exposure to sunlight, subsequent encounter: Secondary | ICD-10-CM | POA: Diagnosis not present

## 2022-04-03 DIAGNOSIS — L638 Other alopecia areata: Secondary | ICD-10-CM | POA: Diagnosis not present

## 2022-04-15 ENCOUNTER — Ambulatory Visit: Payer: Medicare HMO

## 2022-04-17 ENCOUNTER — Other Ambulatory Visit (HOSPITAL_BASED_OUTPATIENT_CLINIC_OR_DEPARTMENT_OTHER): Payer: Self-pay | Admitting: Family

## 2022-04-17 ENCOUNTER — Other Ambulatory Visit: Payer: Self-pay | Admitting: Internal Medicine

## 2022-04-17 DIAGNOSIS — I4821 Permanent atrial fibrillation: Secondary | ICD-10-CM

## 2022-04-17 DIAGNOSIS — E032 Hypothyroidism due to medicaments and other exogenous substances: Secondary | ICD-10-CM

## 2022-04-20 NOTE — Telephone Encounter (Signed)
Prescription refill request for Eliquis received. Indication: Afib  Last office visit: 03/09/22 Charlean Merl) Scr: 1.51 (02/10/22)  Age: 77 Weight: 95.9kg  Appropriate dose. Refill sent.

## 2022-04-20 NOTE — Telephone Encounter (Signed)
Please review for refill. Thank you! 

## 2022-04-21 DIAGNOSIS — I129 Hypertensive chronic kidney disease with stage 1 through stage 4 chronic kidney disease, or unspecified chronic kidney disease: Secondary | ICD-10-CM | POA: Diagnosis not present

## 2022-04-21 DIAGNOSIS — F331 Major depressive disorder, recurrent, moderate: Secondary | ICD-10-CM | POA: Diagnosis not present

## 2022-04-21 DIAGNOSIS — E032 Hypothyroidism due to medicaments and other exogenous substances: Secondary | ICD-10-CM | POA: Diagnosis not present

## 2022-04-21 DIAGNOSIS — M159 Polyosteoarthritis, unspecified: Secondary | ICD-10-CM | POA: Diagnosis not present

## 2022-04-21 DIAGNOSIS — N183 Chronic kidney disease, stage 3 unspecified: Secondary | ICD-10-CM | POA: Diagnosis not present

## 2022-04-21 DIAGNOSIS — E785 Hyperlipidemia, unspecified: Secondary | ICD-10-CM | POA: Diagnosis not present

## 2022-04-22 ENCOUNTER — Encounter: Payer: Self-pay | Admitting: Cardiology

## 2022-04-22 DIAGNOSIS — I4821 Permanent atrial fibrillation: Secondary | ICD-10-CM

## 2022-04-22 MED ORDER — APIXABAN 5 MG PO TABS: 5.0000 mg | ORAL_TABLET | Freq: Two times a day (BID) | ORAL | 0 refills | Status: DC

## 2022-05-01 ENCOUNTER — Encounter: Payer: Self-pay | Admitting: Oncology

## 2022-05-12 ENCOUNTER — Other Ambulatory Visit: Payer: Medicare HMO

## 2022-05-12 ENCOUNTER — Ambulatory Visit: Payer: Medicare HMO | Admitting: Oncology

## 2022-05-13 DIAGNOSIS — N183 Chronic kidney disease, stage 3 unspecified: Secondary | ICD-10-CM | POA: Diagnosis not present

## 2022-05-13 DIAGNOSIS — N2581 Secondary hyperparathyroidism of renal origin: Secondary | ICD-10-CM | POA: Diagnosis not present

## 2022-05-13 DIAGNOSIS — F331 Major depressive disorder, recurrent, moderate: Secondary | ICD-10-CM | POA: Diagnosis not present

## 2022-05-13 DIAGNOSIS — F419 Anxiety disorder, unspecified: Secondary | ICD-10-CM | POA: Diagnosis not present

## 2022-05-13 DIAGNOSIS — I480 Paroxysmal atrial fibrillation: Secondary | ICD-10-CM | POA: Diagnosis not present

## 2022-05-13 DIAGNOSIS — I129 Hypertensive chronic kidney disease with stage 1 through stage 4 chronic kidney disease, or unspecified chronic kidney disease: Secondary | ICD-10-CM | POA: Diagnosis not present

## 2022-05-13 DIAGNOSIS — E032 Hypothyroidism due to medicaments and other exogenous substances: Secondary | ICD-10-CM | POA: Diagnosis not present

## 2022-05-13 DIAGNOSIS — E785 Hyperlipidemia, unspecified: Secondary | ICD-10-CM | POA: Diagnosis not present

## 2022-05-13 DIAGNOSIS — D6869 Other thrombophilia: Secondary | ICD-10-CM | POA: Diagnosis not present

## 2022-05-19 ENCOUNTER — Inpatient Hospital Stay: Payer: Medicare HMO | Admitting: Oncology

## 2022-05-19 ENCOUNTER — Inpatient Hospital Stay: Payer: Medicare HMO | Attending: Oncology

## 2022-05-19 ENCOUNTER — Inpatient Hospital Stay: Payer: Medicare HMO

## 2022-05-19 VITALS — BP 155/90 | HR 100 | Temp 98.1°F | Resp 18 | Ht 67.0 in | Wt 224.1 lb

## 2022-05-19 DIAGNOSIS — M19072 Primary osteoarthritis, left ankle and foot: Secondary | ICD-10-CM | POA: Insufficient documentation

## 2022-05-19 DIAGNOSIS — G4730 Sleep apnea, unspecified: Secondary | ICD-10-CM | POA: Insufficient documentation

## 2022-05-19 DIAGNOSIS — D709 Neutropenia, unspecified: Secondary | ICD-10-CM | POA: Diagnosis not present

## 2022-05-19 DIAGNOSIS — M19042 Primary osteoarthritis, left hand: Secondary | ICD-10-CM | POA: Insufficient documentation

## 2022-05-19 DIAGNOSIS — D696 Thrombocytopenia, unspecified: Secondary | ICD-10-CM | POA: Insufficient documentation

## 2022-05-19 DIAGNOSIS — C8338 Diffuse large B-cell lymphoma, lymph nodes of multiple sites: Secondary | ICD-10-CM | POA: Insufficient documentation

## 2022-05-19 DIAGNOSIS — C8598 Non-Hodgkin lymphoma, unspecified, lymph nodes of multiple sites: Secondary | ICD-10-CM | POA: Diagnosis not present

## 2022-05-19 DIAGNOSIS — R2681 Unsteadiness on feet: Secondary | ICD-10-CM | POA: Insufficient documentation

## 2022-05-19 DIAGNOSIS — M19071 Primary osteoarthritis, right ankle and foot: Secondary | ICD-10-CM | POA: Insufficient documentation

## 2022-05-19 DIAGNOSIS — M19041 Primary osteoarthritis, right hand: Secondary | ICD-10-CM | POA: Diagnosis not present

## 2022-05-19 DIAGNOSIS — N189 Chronic kidney disease, unspecified: Secondary | ICD-10-CM | POA: Insufficient documentation

## 2022-05-19 DIAGNOSIS — Z79899 Other long term (current) drug therapy: Secondary | ICD-10-CM | POA: Insufficient documentation

## 2022-05-19 DIAGNOSIS — R5383 Other fatigue: Secondary | ICD-10-CM | POA: Diagnosis not present

## 2022-05-19 DIAGNOSIS — I129 Hypertensive chronic kidney disease with stage 1 through stage 4 chronic kidney disease, or unspecified chronic kidney disease: Secondary | ICD-10-CM | POA: Diagnosis not present

## 2022-05-19 DIAGNOSIS — D539 Nutritional anemia, unspecified: Secondary | ICD-10-CM | POA: Insufficient documentation

## 2022-05-19 DIAGNOSIS — R0609 Other forms of dyspnea: Secondary | ICD-10-CM | POA: Insufficient documentation

## 2022-05-19 DIAGNOSIS — I4891 Unspecified atrial fibrillation: Secondary | ICD-10-CM | POA: Diagnosis not present

## 2022-05-19 LAB — CBC WITH DIFFERENTIAL (CANCER CENTER ONLY)
Abs Immature Granulocytes: 0.05 10*3/uL (ref 0.00–0.07)
Basophils Absolute: 0 10*3/uL (ref 0.0–0.1)
Basophils Relative: 1 %
Eosinophils Absolute: 0.2 10*3/uL (ref 0.0–0.5)
Eosinophils Relative: 6 %
HCT: 40.8 % (ref 36.0–46.0)
Hemoglobin: 13.4 g/dL (ref 12.0–15.0)
Immature Granulocytes: 2 %
Lymphocytes Relative: 43 %
Lymphs Abs: 1.3 10*3/uL (ref 0.7–4.0)
MCH: 33.2 pg (ref 26.0–34.0)
MCHC: 32.8 g/dL (ref 30.0–36.0)
MCV: 101 fL — ABNORMAL HIGH (ref 80.0–100.0)
Monocytes Absolute: 0.5 10*3/uL (ref 0.1–1.0)
Monocytes Relative: 16 %
Neutro Abs: 1 10*3/uL — ABNORMAL LOW (ref 1.7–7.7)
Neutrophils Relative %: 32 %
Platelet Count: 134 10*3/uL — ABNORMAL LOW (ref 150–400)
RBC: 4.04 MIL/uL (ref 3.87–5.11)
RDW: 13.2 % (ref 11.5–15.5)
WBC Count: 3.1 10*3/uL — ABNORMAL LOW (ref 4.0–10.5)
nRBC: 0 % (ref 0.0–0.2)

## 2022-05-19 LAB — CMP (CANCER CENTER ONLY)
ALT: 16 U/L (ref 0–44)
AST: 18 U/L (ref 15–41)
Albumin: 3.9 g/dL (ref 3.5–5.0)
Alkaline Phosphatase: 67 U/L (ref 38–126)
Anion gap: 5 (ref 5–15)
BUN: 17 mg/dL (ref 8–23)
CO2: 29 mmol/L (ref 22–32)
Calcium: 9.1 mg/dL (ref 8.9–10.3)
Chloride: 108 mmol/L (ref 98–111)
Creatinine: 1.16 mg/dL — ABNORMAL HIGH (ref 0.44–1.00)
GFR, Estimated: 49 mL/min — ABNORMAL LOW (ref >60)
Glucose, Bld: 107 mg/dL — ABNORMAL HIGH (ref 70–99)
Potassium: 4.1 mmol/L (ref 3.5–5.1)
Sodium: 142 mmol/L (ref 135–145)
Total Bilirubin: 0.5 mg/dL (ref 0.3–1.2)
Total Protein: 6.4 g/dL — ABNORMAL LOW (ref 6.5–8.1)

## 2022-05-19 LAB — LACTATE DEHYDROGENASE: LDH: 191 U/L (ref 98–192)

## 2022-05-19 NOTE — Progress Notes (Signed)
[facility] OFFICE PROGRESS NOTE   Diagnosis: Non-Hodgkin's lymphoma  INTERVAL HISTORY:   [name] [name] returns as scheduled.  She feels well.  Good appetite and energy level.  No fever or sweats.  She has "arthritis "in the hands and feet.  She continues to have an irregular heart rate.  The nodular area at the left groin is rubbing against her underwear.  Objective:  Vital signs in last 24 hours:  Blood pressure (!) 155/90, pulse 100, temperature 98.1 F (36.7 C), temperature source Oral, resp. rate 18, height 5\' 7"  (1.702 m), weight 224 lb 1.6 oz (101.7 kg), SpO2 98 %.    Lymphatics: No cervical, supraclavicular, axillary, or inguinal nodes.  Smooth rounded 4 cm nodular area in the left groin Resp: Lungs clear bilaterally Cardio: Irregular GI: No hepatosplenomegaly Vascular: No leg edema, left lower leg is slightly larger than the right side  Portacath/PICC-without erythema  Lab Results:  Lab Results  Component Value Date   WBC 3.1 (L) 05/19/2022   HGB 13.4 05/19/2022   HCT 40.8 05/19/2022   MCV 101.0 (H) 05/19/2022   PLT 134 (L) 05/19/2022   NEUTROABS 1.0 (L) 05/19/2022    CMP  Lab Results  Component Value Date   NA 142 05/19/2022   K 4.1 05/19/2022   CL 108 05/19/2022   CO2 29 05/19/2022   GLUCOSE 107 (H) 05/19/2022   BUN 17 05/19/2022   CREATININE 1.16 (H) 05/19/2022   CALCIUM 9.1 05/19/2022   PROT 6.4 (L) 05/19/2022   ALBUMIN 3.9 05/19/2022   AST 18 05/19/2022   ALT 16 05/19/2022   ALKPHOS 67 05/19/2022   BILITOT 0.5 05/19/2022   GFRNONAA 49 (L) 05/19/2022   GFRAA 46 (L) 11/15/2017    Medications: I have reviewed the patient's current medications.   Assessment/Plan: Large B-cell lymphoma presenting with macrocytic anemia/thrombocytopenia -08/13/2020 B12 3500 -08/13/2020 LDH 612 -08/13/2020 myeloma panel-no monoclonal protein  -Bone marrow biopsy 08/20/2020-hypercellular marrow with erythroid hyperplasia and dyspoiesis, rare lipogranuloma  like lesions.  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  Negative myeloma FISH panel, 46XX; neotype myeloid disorders profile NGS sequencing-no pathogenic mutations detected in any of the genes on the NGS panel -PNH screen - 08/26/2020 -CTs 02/17/2021-new splenomegaly; diffuse bilateral bronchial wall thickening; clustered groundglass and fine nodular opacity in the dependent right lower lobe.  CAD. -Bone marrow biopsy 08/13/2021-hypercellular bone marrow for age with trilineage hematopoiesis; several atypical lymphoid aggregates present; no increase in blastic cells; flow cytometry without significant T or B-cell abnormalities; normal cytogenetics; "the limited morphologic and immunohistochemical features are atypical and a lymphoproliferative process is not excluded" -PET scan 08/27/2021-widespread hypermetabolic lymphadenopathy in the neck, left axilla, mediastinum, retroperitoneum, and left pelvis.  Hypermetabolic skeletal lesions, generalized bilateral pulmonary hypermetabolism -08/30/2021-1 unit RBCs -Excisional lymph node biopsy left groin 08/30/2021-Large B-cell lymphoma, CD20 positive, CD30 positive; FISH analysis-no evidence of a double/triple hit lymphoma -Cycle 1 R-CHOP 09/05/2021, Granix 09/06/2021 for 7 days -Cycle 2 R-CHOP 09/29/2021, Udenyca -Cycle 3 R-CHOP 10/20/2021, Udenyca -Cycle 4 R-CHOP 11/10/2021, Udenyca -PET 11/21/2021-complete resolution of hypermetabolic lymphadenopathy in some areas and marked decrease in hypermetabolic lymphadenopathy and others, [place] 1-2, diffuse groundglass lung opacity with hypermetabolism has resolved, decrease in splenomegaly -Cycle 5 R-CHOP 12/11/2021 -Cycle 6 R-CHOP 01/08/2022 Fatigue Dyspnea on exertion Unsteady gait/balance disorder, followed by Dr. Everlena Cooper CKD Atrial fibrillation Hypertension Sleep apnea History of anorexia/weight loss 06/13/2021 skin rash, question vasculitis-course of prednisone initiated; 06/20/2021 marked  improvement, rash recurred when she was tapered off of  prednisone, prednisone resumed by dermatology History of coagulopathy-most likely related to malnutrition and apixaban Port-A-Cath placement 09/09/2021   Disposition: [name] [name] is in clinical remission from non-Hodgkin's lymphoma.  She has mild neutropenia and thrombocytopenia today.  These are likely related to the course of systemic therapy including chemotherapy and Rituxan.  She will call for a fever or symptom of infection.  She will return for a Port-A-Cath flush and CBC in 6 weeks.  [name] [name] will be scheduled for an office visit in 3 months.  The nodular area in the left groin is likely a postoperative seroma.  She would like to consider having this area drained.  Will make referral to Dr. Carolynne Edouard.  Thornton Papas, MD  05/19/2022  11:41 AM

## 2022-05-29 ENCOUNTER — Ambulatory Visit
Admission: RE | Admit: 2022-05-29 | Discharge: 2022-05-29 | Disposition: A | Discharge status: Home / Self Care | Payer: Medicare HMO | Source: Ambulatory Visit | Attending: Family Medicine | Admitting: Family Medicine

## 2022-05-29 DIAGNOSIS — Z1231 Encounter for screening mammogram for malignant neoplasm of breast: Secondary | ICD-10-CM | POA: Diagnosis not present

## 2022-06-16 ENCOUNTER — Encounter (HOSPITAL_COMMUNITY): Payer: Self-pay | Admitting: General Surgery

## 2022-06-16 DIAGNOSIS — C8590 Non-Hodgkin lymphoma, unspecified, unspecified site: Secondary | ICD-10-CM | POA: Diagnosis not present

## 2022-06-17 ENCOUNTER — Other Ambulatory Visit (HOSPITAL_COMMUNITY): Payer: Self-pay | Admitting: General Surgery

## 2022-06-17 DIAGNOSIS — I129 Hypertensive chronic kidney disease with stage 1 through stage 4 chronic kidney disease, or unspecified chronic kidney disease: Secondary | ICD-10-CM | POA: Diagnosis not present

## 2022-06-17 DIAGNOSIS — N1832 Chronic kidney disease, stage 3b: Secondary | ICD-10-CM | POA: Diagnosis not present

## 2022-06-17 DIAGNOSIS — D631 Anemia in chronic kidney disease: Secondary | ICD-10-CM | POA: Diagnosis not present

## 2022-06-17 DIAGNOSIS — T888XXA Other specified complications of surgical and medical care, not elsewhere classified, initial encounter: Secondary | ICD-10-CM

## 2022-06-17 DIAGNOSIS — N2581 Secondary hyperparathyroidism of renal origin: Secondary | ICD-10-CM | POA: Diagnosis not present

## 2022-06-17 DIAGNOSIS — I4891 Unspecified atrial fibrillation: Secondary | ICD-10-CM | POA: Diagnosis not present

## 2022-06-18 LAB — LAB REPORT - SCANNED: Creatinine, POC: 150.5 mg/dL

## 2022-06-19 ENCOUNTER — Encounter: Payer: Self-pay | Admitting: General Practice

## 2022-06-19 NOTE — Progress Notes (Signed)
Simonne Come, MD  Caroleen Hamman, NT No imaging.  Would arrange for US guided aspiration only (no drain and no sedation).  Nedra Hai       Previous Messages    ----- Message ----- From: Caroleen Hamman, NT Sent: 06/17/2022  10:17 AM EDT To: Ir Procedure Requests Subject: [place] FNA SOFT TISSUE                            Procedure: [place] FNA SOFT TISSUE  Reason: Fluid collection at surgical site, initial encounter     Left groin seroma-h/o node biopsy  History: NM Pet in chart, port insertion, and IR bmbx from 2023  Provider: Griselda Miner, MD  Contact: [phone]

## 2022-06-22 ENCOUNTER — Encounter: Payer: Self-pay | Admitting: General Practice

## 2022-06-22 NOTE — Progress Notes (Signed)
Jake Bathe, MD  Vasily Fedewa, Katheren Shams, NT; P Cv Div Preop She may hold Eliquis for 2 days prior to procedure. Thanks Donato Schultz, MD       Previous Messages    ----- Message ----- From: Caroleen Hamman, NT Sent: 06/19/2022   2:22 PM EDT To: Jake Bathe, MD Subject: Pt having US guided fluid drain                Good Afternoon This pt is scheduled for a US Guided Fluid Drain on 07/01/22 and will need to hold her Eliquis. Can we get your permission to hold this medication?   .  Thanks Donna Christen Centralized Scheduling

## 2022-06-23 ENCOUNTER — Encounter: Payer: Self-pay | Admitting: Nephrology

## 2022-06-30 ENCOUNTER — Inpatient Hospital Stay: Payer: Medicare HMO | Attending: Oncology

## 2022-06-30 ENCOUNTER — Other Ambulatory Visit: Payer: Self-pay | Admitting: Student

## 2022-06-30 DIAGNOSIS — C8338 Diffuse large B-cell lymphoma, lymph nodes of multiple sites: Secondary | ICD-10-CM | POA: Insufficient documentation

## 2022-06-30 NOTE — Patient Instructions (Signed)

## 2022-07-01 ENCOUNTER — Other Ambulatory Visit (HOSPITAL_COMMUNITY): Payer: Self-pay | Admitting: General Surgery

## 2022-07-01 ENCOUNTER — Ambulatory Visit (HOSPITAL_COMMUNITY)
Admission: RE | Admit: 2022-07-01 | Discharge: 2022-07-01 | Disposition: A | Discharge status: Home / Self Care | Payer: Medicare HMO | Source: Ambulatory Visit | Attending: General Surgery | Admitting: General Surgery

## 2022-07-01 DIAGNOSIS — T8143XA Infection following a procedure, organ and space surgical site, initial encounter: Secondary | ICD-10-CM | POA: Diagnosis not present

## 2022-07-01 DIAGNOSIS — Z8572 Personal history of non-Hodgkin lymphomas: Secondary | ICD-10-CM | POA: Diagnosis not present

## 2022-07-01 DIAGNOSIS — Z049 Encounter for examination and observation for unspecified reason: Secondary | ICD-10-CM | POA: Insufficient documentation

## 2022-07-01 DIAGNOSIS — T888XXA Other specified complications of surgical and medical care, not elsewhere classified, initial encounter: Secondary | ICD-10-CM | POA: Diagnosis not present

## 2022-07-01 DIAGNOSIS — C8590 Non-Hodgkin lymphoma, unspecified, unspecified site: Secondary | ICD-10-CM | POA: Diagnosis not present

## 2022-07-01 DIAGNOSIS — Y828 Other medical devices associated with adverse incidents: Secondary | ICD-10-CM | POA: Diagnosis not present

## 2022-07-01 MED ORDER — LIDOCAINE HCL (PF) 1 % IJ SOLN
10.0000 mL | Freq: Once | INTRAMUSCULAR | Status: AC
  Administered 2022-07-01: 10 mL via INTRADERMAL

## 2022-07-01 NOTE — Procedures (Signed)
Interventional Radiology Procedure:   Indications: Fluid collection at left groin surgical site  Procedure: US guided aspiration of left groin fluid collection  Findings: Complex fluid collection in left groin.  Aspirated 20 ml of clear fluid.  Collection was decompressed after aspiration.    Complications: None     EBL: Minimal  Plan: Send fluid for cytology.   Nyelli Samara R. Lowella Dandy, MD  Pager: [phone]

## 2022-07-02 LAB — CYTOLOGY - NON PAP

## 2022-07-13 DIAGNOSIS — N1832 Chronic kidney disease, stage 3b: Secondary | ICD-10-CM | POA: Diagnosis not present

## 2022-07-13 DIAGNOSIS — E032 Hypothyroidism due to medicaments and other exogenous substances: Secondary | ICD-10-CM | POA: Diagnosis not present

## 2022-07-13 DIAGNOSIS — E785 Hyperlipidemia, unspecified: Secondary | ICD-10-CM | POA: Diagnosis not present

## 2022-07-13 DIAGNOSIS — I129 Hypertensive chronic kidney disease with stage 1 through stage 4 chronic kidney disease, or unspecified chronic kidney disease: Secondary | ICD-10-CM | POA: Diagnosis not present

## 2022-07-13 DIAGNOSIS — Z6834 Body mass index (BMI) 34.0-34.9, adult: Secondary | ICD-10-CM | POA: Diagnosis not present

## 2022-07-13 DIAGNOSIS — E669 Obesity, unspecified: Secondary | ICD-10-CM | POA: Diagnosis not present

## 2022-07-17 DIAGNOSIS — C8590 Non-Hodgkin lymphoma, unspecified, unspecified site: Secondary | ICD-10-CM | POA: Diagnosis not present

## 2022-07-21 DIAGNOSIS — C8590 Non-Hodgkin lymphoma, unspecified, unspecified site: Secondary | ICD-10-CM | POA: Diagnosis not present

## 2022-07-21 DIAGNOSIS — T888XXA Other specified complications of surgical and medical care, not elsewhere classified, initial encounter: Secondary | ICD-10-CM | POA: Diagnosis not present

## 2022-07-24 DIAGNOSIS — C8590 Non-Hodgkin lymphoma, unspecified, unspecified site: Secondary | ICD-10-CM | POA: Diagnosis not present

## 2022-07-24 IMAGING — DX DG CHEST 1V PORT
1 series · 1 of 1 positions shown · non-contrast
Comparison: 04/07/2021.

CLINICAL DATA: Atrial fibrillation.

EXAM:
PORTABLE CHEST 1 VIEW

[chest ap]
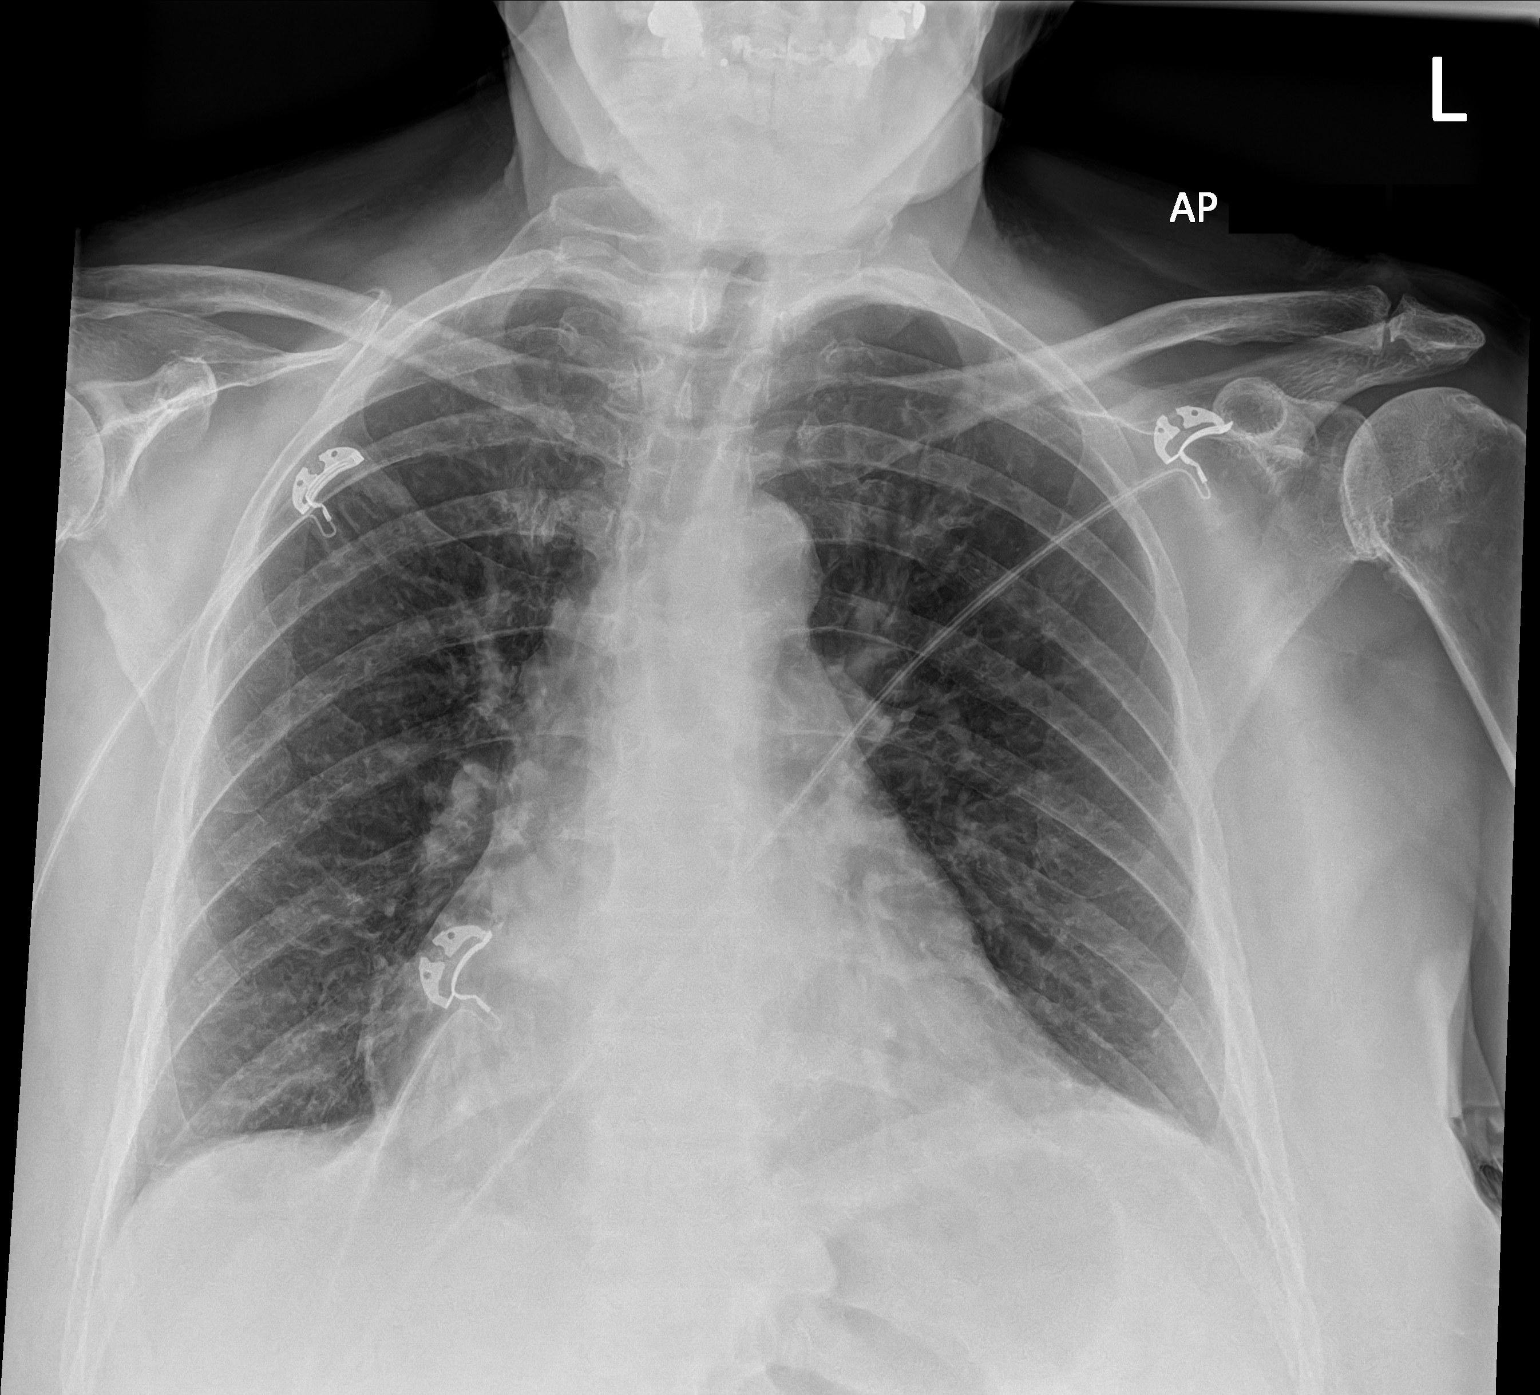

[1 of 1 positions shown; findings below may reference images not displayed]

FINDINGS: Cardiac silhouette mildly enlarged.  No mediastinal or hilar masses.

Lungs are clear.  No convincing pleural effusion.  No pneumothorax.

Skeletal structures are grossly intact.
IMPRESSION: No acute cardiopulmonary disease.

## 2022-07-27 DIAGNOSIS — X32XXXD Exposure to sunlight, subsequent encounter: Secondary | ICD-10-CM | POA: Diagnosis not present

## 2022-07-27 DIAGNOSIS — L820 Inflamed seborrheic keratosis: Secondary | ICD-10-CM | POA: Diagnosis not present

## 2022-07-27 DIAGNOSIS — C44519 Basal cell carcinoma of skin of other part of trunk: Secondary | ICD-10-CM | POA: Diagnosis not present

## 2022-07-27 DIAGNOSIS — L570 Actinic keratosis: Secondary | ICD-10-CM | POA: Diagnosis not present

## 2022-08-04 ENCOUNTER — Encounter: Payer: Self-pay | Admitting: Cardiology

## 2022-08-04 DIAGNOSIS — C8590 Non-Hodgkin lymphoma, unspecified, unspecified site: Secondary | ICD-10-CM | POA: Diagnosis not present

## 2022-08-05 NOTE — Telephone Encounter (Signed)
Please see the MyChart message reply(ies) for my assessment and plan.   Thank you for your message seeking medical advice.* My assessment and recommendation are as follows: Agree with Margaretmary Dys, pharmacist.  I am comfortable with you holding your Eliquis 1 to 2 days to see if this helps.  If bleeding becomes worse despite conservative measures such as holding pressure etc., please contact provider who performed biopsy.  This patient gave consent for this Medical Advice Message and is aware that it may result in a bill to [employer], as well as the possibility of receiving a bill for a co-payment or deductible. They are an established patient, but are not seeking medical advice exclusively about a problem treated during an in person or video visit in the last seven days. I did not recommend an in person or video visit within seven days of my reply.    I spent a total of 6 minutes cumulative time within 7 days through [employer].  Donato Schultz, MD

## 2022-08-11 ENCOUNTER — Inpatient Hospital Stay: Payer: Medicare HMO | Attending: Oncology

## 2022-08-11 ENCOUNTER — Inpatient Hospital Stay: Payer: Medicare HMO

## 2022-08-11 ENCOUNTER — Inpatient Hospital Stay: Payer: Medicare HMO | Admitting: Oncology

## 2022-08-11 VITALS — BP 140/77 | HR 100 | Temp 98.1°F | Resp 20 | Ht 67.0 in | Wt 223.5 lb

## 2022-08-11 DIAGNOSIS — N183 Chronic kidney disease, stage 3 unspecified: Secondary | ICD-10-CM | POA: Diagnosis not present

## 2022-08-11 DIAGNOSIS — C8598 Non-Hodgkin lymphoma, unspecified, lymph nodes of multiple sites: Secondary | ICD-10-CM

## 2022-08-11 DIAGNOSIS — D696 Thrombocytopenia, unspecified: Secondary | ICD-10-CM | POA: Diagnosis not present

## 2022-08-11 DIAGNOSIS — R2681 Unsteadiness on feet: Secondary | ICD-10-CM | POA: Diagnosis not present

## 2022-08-11 DIAGNOSIS — D539 Nutritional anemia, unspecified: Secondary | ICD-10-CM | POA: Diagnosis not present

## 2022-08-11 DIAGNOSIS — C8338 Diffuse large B-cell lymphoma, lymph nodes of multiple sites: Secondary | ICD-10-CM | POA: Diagnosis not present

## 2022-08-11 DIAGNOSIS — R5383 Other fatigue: Secondary | ICD-10-CM | POA: Diagnosis not present

## 2022-08-11 DIAGNOSIS — R0609 Other forms of dyspnea: Secondary | ICD-10-CM | POA: Diagnosis not present

## 2022-08-11 DIAGNOSIS — G4730 Sleep apnea, unspecified: Secondary | ICD-10-CM | POA: Insufficient documentation

## 2022-08-11 DIAGNOSIS — I4891 Unspecified atrial fibrillation: Secondary | ICD-10-CM | POA: Diagnosis not present

## 2022-08-11 DIAGNOSIS — Z79899 Other long term (current) drug therapy: Secondary | ICD-10-CM | POA: Diagnosis not present

## 2022-08-11 DIAGNOSIS — R21 Rash and other nonspecific skin eruption: Secondary | ICD-10-CM | POA: Diagnosis not present

## 2022-08-11 DIAGNOSIS — I129 Hypertensive chronic kidney disease with stage 1 through stage 4 chronic kidney disease, or unspecified chronic kidney disease: Secondary | ICD-10-CM | POA: Diagnosis not present

## 2022-08-11 DIAGNOSIS — Z95828 Presence of other vascular implants and grafts: Secondary | ICD-10-CM

## 2022-08-11 DIAGNOSIS — I2510 Atherosclerotic heart disease of native coronary artery without angina pectoris: Secondary | ICD-10-CM | POA: Diagnosis not present

## 2022-08-11 LAB — CMP (CANCER CENTER ONLY)
ALT: 11 U/L (ref 0–44)
AST: 16 U/L (ref 15–41)
Albumin: 4 g/dL (ref 3.5–5.0)
Alkaline Phosphatase: 73 U/L (ref 38–126)
Anion gap: 5 (ref 5–15)
BUN: 15 mg/dL (ref 8–23)
CO2: 28 mmol/L (ref 22–32)
Calcium: 9 mg/dL (ref 8.9–10.3)
Chloride: 108 mmol/L (ref 98–111)
Creatinine: 1.16 mg/dL — ABNORMAL HIGH (ref 0.44–1.00)
GFR, Estimated: 49 mL/min — ABNORMAL LOW (ref >60)
Glucose, Bld: 117 mg/dL — ABNORMAL HIGH (ref 70–99)
Potassium: 4.1 mmol/L (ref 3.5–5.1)
Sodium: 141 mmol/L (ref 135–145)
Total Bilirubin: 0.4 mg/dL (ref 0.3–1.2)
Total Protein: 6.2 g/dL — ABNORMAL LOW (ref 6.5–8.1)

## 2022-08-11 LAB — CBC WITH DIFFERENTIAL (CANCER CENTER ONLY)
Abs Immature Granulocytes: 0 10*3/uL (ref 0.00–0.07)
Basophils Absolute: 0 10*3/uL (ref 0.0–0.1)
Basophils Relative: 1 %
Eosinophils Absolute: 0.2 10*3/uL (ref 0.0–0.5)
Eosinophils Relative: 4 %
HCT: 40 % (ref 36.0–46.0)
Hemoglobin: 13.4 g/dL (ref 12.0–15.0)
Immature Granulocytes: 0 %
Lymphocytes Relative: 41 %
Lymphs Abs: 1.6 10*3/uL (ref 0.7–4.0)
MCH: 33.3 pg (ref 26.0–34.0)
MCHC: 33.5 g/dL (ref 30.0–36.0)
MCV: 99.3 fL (ref 80.0–100.0)
Monocytes Absolute: 0.5 10*3/uL (ref 0.1–1.0)
Monocytes Relative: 13 %
Neutro Abs: 1.6 10*3/uL — ABNORMAL LOW (ref 1.7–7.7)
Neutrophils Relative %: 41 %
Platelet Count: 138 10*3/uL — ABNORMAL LOW (ref 150–400)
RBC: 4.03 MIL/uL (ref 3.87–5.11)
RDW: 13.7 % (ref 11.5–15.5)
WBC Count: 3.8 10*3/uL — ABNORMAL LOW (ref 4.0–10.5)
nRBC: 0 % (ref 0.0–0.2)

## 2022-08-11 LAB — LACTATE DEHYDROGENASE: LDH: 164 U/L (ref 98–192)

## 2022-08-11 MED ORDER — HEPARIN SOD (PORK) LOCK FLUSH 100 UNIT/ML IV SOLN
500.0000 [IU] | Freq: Once | INTRAVENOUS | Status: AC
  Administered 2022-08-11: 500 [IU] via INTRAVENOUS

## 2022-08-11 MED ORDER — SODIUM CHLORIDE 0.9% FLUSH
10.0000 mL | Freq: Once | INTRAVENOUS | Status: AC
  Administered 2022-08-11: 10 mL via INTRAVENOUS

## 2022-08-11 NOTE — Progress Notes (Signed)
[facility] OFFICE PROGRESS NOTE   Diagnosis: Non-Hodgkin's lymphoma  INTERVAL HISTORY:   [name] [name] returns as scheduled.  She feels well.  Good appetite.  No fever, night sweats, or rash.  She underwent drainage of the left inguinal fluid collection in radiology 07/01/2022.  The fluid revealed "atypical cells ".  The fluid was aspirated by Dr. Carolynne Edouard on 07/17/2022.  There is now an open wound to left groin.  She developed sores at the tongue and lips last week.  She was prescribed Peridex rinse by Dr. Cliffton Asters.  Objective:  Vital signs in last 24 hours:  Blood pressure (!) 140/77, pulse 100, temperature 98.1 F (36.7 C), temperature source Oral, resp. rate 20, height 5\' 7"  (1.702 m), weight 223 lb 8 oz (101.4 kg), SpO2 100%.    HEENT: Tiny ulcer at the lower inner lip, no thrush Lymphatics: No cervical, supraclavicular, axillary, or inguinal nodes Resp: Lungs clear bilaterally Cardio: Regular GI: No hepatosplenomegaly, nontender, no mass, the left groin seroma has resolved.  2 cm superficial opening at the left groin with granulation tissue Vascular: No leg edema   Lab Results:  Lab Results  Component Value Date   WBC 3.8 (L) 08/11/2022   HGB 13.4 08/11/2022   HCT 40.0 08/11/2022   MCV 99.3 08/11/2022   PLT 138 (L) 08/11/2022   NEUTROABS 1.6 (L) 08/11/2022    CMP  Lab Results  Component Value Date   NA 141 08/11/2022   K 4.1 08/11/2022   CL 108 08/11/2022   CO2 28 08/11/2022   GLUCOSE 117 (H) 08/11/2022   BUN 15 08/11/2022   CREATININE 1.16 (H) 08/11/2022   CALCIUM 9.0 08/11/2022   PROT 6.2 (L) 08/11/2022   ALBUMIN 4.0 08/11/2022   AST 16 08/11/2022   ALT 11 08/11/2022   ALKPHOS 73 08/11/2022   BILITOT 0.4 08/11/2022   GFRNONAA 49 (L) 08/11/2022   GFRAA 46 (L) 11/15/2017     Medications: I have reviewed the patient's current medications.   Assessment/Plan: Large B-cell lymphoma presenting with macrocytic anemia/thrombocytopenia -08/13/2020  B12 3500 -08/13/2020 LDH 612 -08/13/2020 myeloma panel-no monoclonal protein  -Bone marrow biopsy 08/20/2020-hypercellular marrow with erythroid hyperplasia and dyspoiesis, rare lipogranuloma like lesions.  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  Negative myeloma FISH panel, 46XX; neotype myeloid disorders profile NGS sequencing-no pathogenic mutations detected in any of the genes on the NGS panel -PNH screen - 08/26/2020 -CTs 02/17/2021-new splenomegaly; diffuse bilateral bronchial wall thickening; clustered groundglass and fine nodular opacity in the dependent right lower lobe.  CAD. -Bone marrow biopsy 08/13/2021-hypercellular bone marrow for age with trilineage hematopoiesis; several atypical lymphoid aggregates present; no increase in blastic cells; flow cytometry without significant T or B-cell abnormalities; normal cytogenetics; "the limited morphologic and immunohistochemical features are atypical and a lymphoproliferative process is not excluded" -PET scan 08/27/2021-widespread hypermetabolic lymphadenopathy in the neck, left axilla, mediastinum, retroperitoneum, and left pelvis.  Hypermetabolic skeletal lesions, generalized bilateral pulmonary hypermetabolism -08/30/2021-1 unit RBCs -Excisional lymph node biopsy left groin 08/30/2021-Large B-cell lymphoma, CD20 positive, CD30 positive; FISH analysis-no evidence of a double/triple hit lymphoma -Cycle 1 R-CHOP 09/05/2021, Granix 09/06/2021 for 7 days -Cycle 2 R-CHOP 09/29/2021, Udenyca -Cycle 3 R-CHOP 10/20/2021, Udenyca -Cycle 4 R-CHOP 11/10/2021, Udenyca -PET 11/21/2021-complete resolution of hypermetabolic lymphadenopathy in some areas and marked decrease in hypermetabolic lymphadenopathy and others, Deauville 1-2, diffuse groundglass lung opacity with hypermetabolism has resolved, decrease in splenomegaly -Cycle 5 R-CHOP 12/11/2021 -Cycle 6 R-CHOP 01/08/2022 Fatigue Dyspnea on exertion Unsteady gait/balance  disorder, followed by  Dr. Everlena Cooper CKD Atrial fibrillation Hypertension Sleep apnea History of anorexia/weight loss 06/13/2021 skin rash, question vasculitis-course of prednisone initiated; 06/20/2021 marked improvement, rash recurred when she was tapered off of prednisone, prednisone resumed by dermatology History of coagulopathy-most likely related to malnutrition and apixaban Port-A-Cath placement 09/09/2021 Left groin seroma following the left inguinal lymph node biopsy-drained in interventional radiology June 2024 and by Dr. Carolynne Edouard 07/17/2022     Disposition: [name] [name] is in clinical remission from non-Hodgkin's lymphoma.  The left groin fluid collection is likely related to a seroma as opposed to progression of lymphoma.  She will return for an office and lab visit in 4 months.  Thornton Papas, MD  08/11/2022  9:42 AM

## 2022-08-12 ENCOUNTER — Other Ambulatory Visit: Payer: Self-pay

## 2022-08-13 ENCOUNTER — Other Ambulatory Visit: Payer: Self-pay

## 2022-08-19 ENCOUNTER — Other Ambulatory Visit: Payer: Self-pay

## 2022-08-27 ENCOUNTER — Ambulatory Visit: Payer: Medicare HMO | Admitting: Cardiology

## 2022-08-27 ENCOUNTER — Encounter: Payer: Self-pay | Admitting: Cardiology

## 2022-08-27 VITALS — BP 138/82 | HR 80 | Ht 67.0 in | Wt 225.2 lb

## 2022-08-27 DIAGNOSIS — D6869 Other thrombophilia: Secondary | ICD-10-CM

## 2022-08-27 DIAGNOSIS — D469 Myelodysplastic syndrome, unspecified: Secondary | ICD-10-CM

## 2022-08-27 DIAGNOSIS — I4821 Permanent atrial fibrillation: Secondary | ICD-10-CM | POA: Diagnosis not present

## 2022-08-27 DIAGNOSIS — C8590 Non-Hodgkin lymphoma, unspecified, unspecified site: Secondary | ICD-10-CM | POA: Diagnosis not present

## 2022-08-27 MED ORDER — METOPROLOL TARTRATE 50 MG PO TABS: 50.0000 mg | ORAL_TABLET | Freq: Two times a day (BID) | ORAL | 3 refills | Status: DC

## 2022-08-27 NOTE — Patient Instructions (Signed)
Medication Instructions:  Your physician recommends that you continue on your current medications as directed. Please refer to the Current Medication list given to you today.  *If you need a refill on your cardiac medications before your next appointment, please call your pharmacy*   Lab Work: NONE If you have labs (blood work) drawn today and your tests are completely normal, you will receive your results only by: MyChart Message (if you have MyChart) OR A paper copy in the mail If you have any lab test that is abnormal or we need to change your treatment, we will call you to review the results.   Testing/Procedures: NONE   Follow-Up: At [facility], you and your health needs are our priority.  As part of our continuing mission to provide you with exceptional heart care, we have created designated Provider Care Teams.  These Care Teams include your primary Cardiologist (physician) and Advanced Practice Providers (APPs -  Physician Assistants and Nurse Practitioners) who all work together to provide you with the care you need, when you need it.  Your next appointment:   1 year(s)  Provider:   Donato Schultz, MD

## 2022-08-27 NOTE — Progress Notes (Signed)
  Cardiology Office Note:  .   Date:  08/27/2022  ID:  [name] [name], DOB November 15, 1945, MRN [id number] PCP: Laurann Montana, MD  [facility] Providers Cardiologist:  Donato Schultz, MD Electrophysiologist:  Lanier Prude, MD    History of Present Illness: .    Discussed the use of AI scribe software for clinical note transcription with the patient, who gave verbal consent to proceed.  History of Present Illness   A 77 year old patient with a history of persistent atrial fibrillation and large B cell lymphoma (currently in remission) presents for a follow-up visit. The patient's atrial fibrillation has been deemed permanent following an unsuccessful cardioversion. The patient reports feeling short of breath easily, which she attributes to her age and possibly her atrial fibrillation. The patient's heart rate at home varies from 75 to 124 beats per minute. The patient is also interested in the Watchman procedure.       Studies Reviewed: Marland Kitchen        LABS Hb: 13.4 Cr: 1.16 TSH: 2.7 HbA1c: 5.7  RADIOLOGY Carotid duplex: Mild carotid disease bilaterally (09/04/2021)  DIAGNOSTIC Echocardiogram: Ejection fraction 60-65%, normal diastolic parameters, moderately dilated left atrium, mild mitral regurgitation (11/21/2021)  Risk Assessment/Calculations:       Physical Exam:   VS:  BP 138/82   Pulse 80   Ht 5\' 7"  (1.702 m)   Wt 225 lb 3.2 oz (102.2 kg)   SpO2 97%   BMI 35.27 kg/m    Wt Readings from Last 3 Encounters:  08/27/22 225 lb 3.2 oz (102.2 kg)  08/11/22 223 lb 8 oz (101.4 kg)  05/19/22 224 lb 1.6 oz (101.7 kg)    GEN: Well nourished, well developed in no acute distress NECK: No JVD; No carotid bruits CARDIAC: irreg , no murmurs, rubs, gallops RESPIRATORY:  Clear to auscultation without rales, wheezing or rhonchi  ABDOMEN: Soft, non-tender, non-distended EXTREMITIES:  No edema; No deformity   ASSESSMENT AND PLAN: .    Assessment and Plan    Permanent Atrial  Fibrillation Unsuccessful cardioversion on 02/06/22. Currently on Metoprolol Tartrate 50mg  twice daily for rate control and Eliquis 5mg  twice daily for stroke prevention. Reports variable heart rate at home, ranging from 75 to 124. Experiencing shortness of breath, which may be related to Afib. -Continue Metoprolol Tartrate 50mg  twice daily and Eliquis 5mg  twice daily. -Schedule appointment with Dr. Lalla Brothers to discuss possibility of Watchman procedure. -Plan for follow-up in AFib clinic in 6 months.  Large B Cell Lymphoma In remission since last treatment on 01/10/22. Recent issue with fluid collection (seroma) at biopsy site, which has been drained twice and is now improving. -Continue follow-up with oncology and surgeon as needed for seroma management.  Medication Refill Metoprolol Tartrate running low. -Refill Metoprolol Tartrate prescription at [facility]              Signed, Donato Schultz, MD

## 2022-09-07 DIAGNOSIS — Z85828 Personal history of other malignant neoplasm of skin: Secondary | ICD-10-CM | POA: Diagnosis not present

## 2022-09-07 DIAGNOSIS — Z08 Encounter for follow-up examination after completed treatment for malignant neoplasm: Secondary | ICD-10-CM | POA: Diagnosis not present

## 2022-09-07 DIAGNOSIS — L820 Inflamed seborrheic keratosis: Secondary | ICD-10-CM | POA: Diagnosis not present

## 2022-09-17 ENCOUNTER — Other Ambulatory Visit: Payer: Self-pay

## 2022-09-23 DIAGNOSIS — C8590 Non-Hodgkin lymphoma, unspecified, unspecified site: Secondary | ICD-10-CM | POA: Diagnosis not present

## 2022-10-05 DIAGNOSIS — N1832 Chronic kidney disease, stage 3b: Secondary | ICD-10-CM | POA: Diagnosis not present

## 2022-10-09 ENCOUNTER — Inpatient Hospital Stay: Payer: Medicare HMO | Attending: Oncology

## 2022-10-09 VITALS — BP 140/93 | HR 99 | Temp 98.2°F | Resp 18 | Ht 67.0 in | Wt 230.9 lb

## 2022-10-09 DIAGNOSIS — C8338 Diffuse large B-cell lymphoma, lymph nodes of multiple sites: Secondary | ICD-10-CM | POA: Diagnosis not present

## 2022-10-09 DIAGNOSIS — Z452 Encounter for adjustment and management of vascular access device: Secondary | ICD-10-CM | POA: Diagnosis not present

## 2022-10-09 DIAGNOSIS — Z95828 Presence of other vascular implants and grafts: Secondary | ICD-10-CM

## 2022-10-09 MED ORDER — SODIUM CHLORIDE 0.9% FLUSH
10.0000 mL | Freq: Once | INTRAVENOUS | Status: AC
  Administered 2022-10-09: 10 mL via INTRAVENOUS

## 2022-10-09 MED ORDER — HEPARIN SOD (PORK) LOCK FLUSH 100 UNIT/ML IV SOLN
500.0000 [IU] | Freq: Once | INTRAVENOUS | Status: AC
  Administered 2022-10-09: 500 [IU] via INTRAVENOUS

## 2022-10-09 NOTE — Patient Instructions (Signed)

## 2022-10-16 DIAGNOSIS — I129 Hypertensive chronic kidney disease with stage 1 through stage 4 chronic kidney disease, or unspecified chronic kidney disease: Secondary | ICD-10-CM | POA: Diagnosis not present

## 2022-10-16 DIAGNOSIS — D631 Anemia in chronic kidney disease: Secondary | ICD-10-CM | POA: Diagnosis not present

## 2022-10-16 DIAGNOSIS — N1831 Chronic kidney disease, stage 3a: Secondary | ICD-10-CM | POA: Diagnosis not present

## 2022-10-16 DIAGNOSIS — N2581 Secondary hyperparathyroidism of renal origin: Secondary | ICD-10-CM | POA: Diagnosis not present

## 2022-10-18 ENCOUNTER — Encounter: Payer: Self-pay | Admitting: Cardiology

## 2022-10-20 ENCOUNTER — Ambulatory Visit: Payer: Medicare HMO | Attending: Cardiology | Admitting: Cardiology

## 2022-10-20 ENCOUNTER — Encounter: Payer: Self-pay | Admitting: Cardiology

## 2022-10-20 ENCOUNTER — Other Ambulatory Visit: Payer: Self-pay

## 2022-10-20 VITALS — BP 130/88 | HR 78 | Ht 67.0 in | Wt 232.6 lb

## 2022-10-20 DIAGNOSIS — I4821 Permanent atrial fibrillation: Secondary | ICD-10-CM | POA: Diagnosis not present

## 2022-10-20 DIAGNOSIS — D6869 Other thrombophilia: Secondary | ICD-10-CM | POA: Diagnosis not present

## 2022-10-20 MED ORDER — DILTIAZEM HCL ER COATED BEADS 240 MG PO CP24: 240.0000 mg | ORAL_CAPSULE | Freq: Every day | ORAL | 3 refills | Status: DC

## 2022-10-20 NOTE — Progress Notes (Signed)
Cardiology Office Note:  .   Date:  10/20/2022  ID:  Jaclynn Major, DOB 1945/04/16, MRN [id number] PCP: Laurann Montana, MD   [facility] Providers Cardiologist:  Donato Schultz, MD Electrophysiologist:  Lanier Prude, MD    History of Present Illness: .   [name] [name] is a 77 y.o. female Discussed the use of AI scribe software for clinical note transcription with the patient, who gave verbal consent to proceed.  History of Present Illness   The patient, a 77 year old with a history of persistent atrial fibrillation and large B-cell lymphoma in remission, presents for follow-up due to worsening shortness of breath. The patient reports difficulty walking across a room without feeling out of breath and needing to rest after about twenty feet. This has been ongoing for approximately four to five weeks and appears to be progressively worsening.  The patient's heart rates at home fluctuate between 75 and 125 beats per minute. An echocardiogram performed approximately a year ago showed a normal ejection fraction of 65% with a moderately dilated left atrium and mild mitral regurgitation. A cardioversion attempt was unsuccessful.  The patient also reports a persistent cough that has been present for several weeks. She has a history of a cough that lasted two years, which was eventually controlled. The patient also reports arthritis in her hands, causing significant pain and limited mobility.  The patient has a history of being on amiodarone, which was discontinued due to concerns related to her cancer treatment. The patient reports a decline in her condition since discontinuation of amiodarone. She has seen a pulmonologist in the past but has not followed up in years.  The patient's kidney function has reportedly improved, with an increase from 36 to 40, and her white and red blood cell counts are within normal limits. The patient has been in the donut hole for her Medicare coverage, which has  made affording her Eliquis medication challenging.            Studies Reviewed: .        Results LABS Blood work: Kidney function 36 to 40, WBC and RBC within normal limits (10/07/2022)  DIAGNOSTIC Echocardiogram: Normal ejection fraction of 65%, moderately dilated left atrium, mild mitral regurgitation (11/21/2021) Cardioversion: Unsuccessful (02/06/2022)  Risk Assessment/Calculations:            Physical Exam:   VS:  BP 130/88   Pulse 78   Ht 5\' 7"  (1.702 m)   Wt 232 lb 9.6 oz (105.5 kg)   SpO2 97%   BMI 36.43 kg/m    Wt Readings from Last 3 Encounters:  10/20/22 232 lb 9.6 oz (105.5 kg)  10/09/22 230 lb 14.4 oz (104.7 kg)  08/27/22 225 lb 3.2 oz (102.2 kg)    GEN: Well nourished, well developed in no acute distress NECK: No JVD; No carotid bruits CARDIAC: IRRR,tachy,  soft systolic murmur, no rubs, no gallops RESPIRATORY:  Clear to auscultation without rales, wheezing or rhonchi  ABDOMEN: Soft, non-tender, non-distended EXTREMITIES:  No edema; No deformity   ASSESSMENT AND PLAN: .    Assessment and Plan    Atrial Fibrillation Persistent --unsuccessful cardioversions. Heart rates at home vary between 75 and 125 bpm. Echocardiogram showed normal ejection fraction with moderately dilated left atrium and mild mitral regurgitation. Currently on Eliquis and Metoprolol. -Discontinue Metoprolol 50mg  twice daily. -Start Diltiazem CD 240mg  once daily. -Continue Eliquis 5mg  twice daily. -Plan for follow-up with Dr. Lalla Brothers in December. -Check-in with APP in November to  assess response to Diltiazem.   Shortness of Breath Progressive over 4-5 weeks, limiting mobility to 20 feet before needing to rest. Unclear etiology, possibly related to AFib. -Order repeat echocardiogram to assess for changes in cardiac function. -Continue monitoring symptoms.  Arthritis Recent onset in hands, causing significant pain.  Financial Concerns Patient expressed difficulty  affording Eliquis due to Medicare coverage gap ("donut hole"). -Consider discussing alternative anticoagulation options or financial assistance programs at next visit.               Signed, Donato Schultz, MD

## 2022-10-20 NOTE — Patient Instructions (Signed)
Medication Instructions:  Please discontinue your Metoprolol and start Diltiazem CD 240 mg once a day. Continue all other medications as listed.  *If you need a refill on your cardiac medications before your next appointment, please call your pharmacy*  Follow-Up: At [facility], you and your health needs are our priority.  As part of our continuing mission to provide you with exceptional heart care, we have created designated Provider Care Teams.  These Care Teams include your primary Cardiologist (physician) and Advanced Practice Providers (APPs -  Physician Assistants and Nurse Practitioners) who all work together to provide you with the care you need, when you need it.  We recommend signing up for the patient portal called "MyChart".  Sign up information is provided on this After Visit Summary.  MyChart is used to connect with patients for Virtual Visits (Telemedicine).  Patients are able to view lab/test results, encounter notes, upcoming appointments, etc.  Non-urgent messages can be sent to your provider as well.   To learn more about what you can do with MyChart, go to [email or web address].    Your next appointment:   1 month(s)  Provider:   Jari Favre, PA-C, Robin Searing, NP, Eligha Bridegroom, NP, Tereso Newcomer, PA-C, or Perlie Gold, PA-C

## 2022-10-21 DIAGNOSIS — X32XXXD Exposure to sunlight, subsequent encounter: Secondary | ICD-10-CM | POA: Diagnosis not present

## 2022-10-21 DIAGNOSIS — L570 Actinic keratosis: Secondary | ICD-10-CM | POA: Diagnosis not present

## 2022-10-26 ENCOUNTER — Encounter: Payer: Self-pay | Admitting: Cardiology

## 2022-10-26 ENCOUNTER — Other Ambulatory Visit: Payer: Self-pay

## 2022-10-26 DIAGNOSIS — I4821 Permanent atrial fibrillation: Secondary | ICD-10-CM

## 2022-10-26 MED ORDER — APIXABAN 5 MG PO TABS: 5.0000 mg | ORAL_TABLET | Freq: Two times a day (BID) | ORAL | 0 refills | Status: DC

## 2022-10-29 ENCOUNTER — Encounter: Payer: Self-pay | Admitting: Cardiology

## 2022-11-03 DIAGNOSIS — C8590 Non-Hodgkin lymphoma, unspecified, unspecified site: Secondary | ICD-10-CM | POA: Diagnosis not present

## 2022-11-06 DIAGNOSIS — M1711 Unilateral primary osteoarthritis, right knee: Secondary | ICD-10-CM | POA: Diagnosis not present

## 2022-11-22 NOTE — Progress Notes (Unsigned)
Office Visit    Patient Name: [name] [name] Date of Encounter: 11/23/2022  PCP:  Laurann Montana, MD   [facility]  Cardiologist:  Donato Schultz, MD  Advanced Practice Provider:  No care team member to display Electrophysiologist:  Lanier Prude, MD }  hpi    [name] [name] is a 77 y.o. female with past medical history significant for CKD, hypertension, hyperlipidemia, MDS, OSA, atrial fibrillation presents today for overdue follow-up visit.  [name] [name] was seen in the atrial fibrillation clinic 04/2021.  She has been maintained on amiodarone.  This was discontinued for possible side effects.  It was felt later that symptoms were due to possible MDS and not amiodarone.  Medication was resumed/7/23.  She has had several ED visits for rapid A-fib requiring DCCV, most recently 04/20/2021.  Fatigue and lightheadedness while in A-fib.  Patient is on Eliquis for CHA2DS2-VASc score of 6.  When she was last seen in follow-up she had reported her dizziness had improved with slower heart rates.  She resumed amlodipine and decrease metoprolol back to baseline dose.  She was tachycardic when she presented for cardiac CT and procedure was canceled.  She was in rapid A-fib at the time of her appointment.  She denied symptoms of palpitation, chest pain, shortness of breath.  I saw her 11/05/2021, she feels much better since getting back on her amiodarone.  She feels like she has not been in atrial fibrillation.  EKG shows normal sinus rhythm today.  She ran out of her Eliquis but to the pharmacy gave her a month supply.  We will refill this today.  She also states she has had some lower extremity edema.  We plan to increase her Lasix to 20 mg daily x3 days then she can go back to her every other day dosing.  She can also increase her potassium to daily during this time.  She does have a systolic murmur on exam today.  We will plan for echocardiogram for further evaluation of her heart  valves.   She has seen Dr. Anne Fu a few times since then.  Reported a persistent cough for several weeks and has a history of cough for the last 2 years.  Also reported arthritis in her hands because eccentric and pain and limited mobility.  Patient has been on amiodarone which was discontinued due to concerns related to her cancer treatment.  She reported a decline in her condition since discontinuation of amiodarone.  She has been seen by pulmonologist in the past but has not followed up in years.  Her kidney function reportedly improved with an increased from 36-40 and her white and red blood cell counts were within normal limits.  Patient unfortunately is in the donut hole for Medicare coverage which has not made her Eliquis medication not affordable.  Today, she presetns with a history of atrial fibrillation,  for a follow-up visit after a recent medication change. She reports feeling "pretty good" since the change, attributing her improved energy levels to the discontinuation of metoprolol. However, she notes that her heart rate remains uncontrolled in the evenings, often reaching 90-130 bpm despite daily Cardizem 240 mg. She also reports that her blood pressure has been running high lately.  The patient is also concerned about the affordability of her Eliquis prescription due to being in the "donut hole" of her insurance coverage. She expresses a preference for a one-month supply of the medication due to the high cost of  a 90-day supply.  In addition, the patient mentions a past diagnosis of cancer, which is now in remission. She expresses interest in the Watchman procedure to manage her atrial fibrillation and potentially discontinue Eliquis. She has an upcoming appointment with her cardiologist to discuss this option.  Reports no shortness of breath nor dyspnea on exertion. Reports no chest pain, pressure, or tightness. No edema, orthopnea, PND. Reports no palpitations.   Discussed the use of  AI scribe software for clinical note transcription with the patient, who gave verbal consent to proceed.  Past Medical History    Past Medical History:  Diagnosis Date   (HFpEF) heart failure with preserved ejection fraction (HCC) 07/06/2016   A-fib (HCC)    paroxysmal   Acquired thrombophilia (HCC) 09/03/2021   Acute bronchitis 06/21/2020   Anemia 08/02/2020   Hgb 13.7-> 10.7 between 10/2019 and 07/2020   Anxiety    Arthritis    Benign neoplasm of colon 12/29/2011   Bilateral carpal tunnel syndrome 11/06/2015   Cataracts, bilateral    immature   Cervical spondylosis without myelopathy 11/06/2015   Chest pain 07/06/2016   Chronic cough    Chronic kidney disease, stage 3 unspecified (HCC) 03/26/2021   Chronic rhinitis 06/16/2017   CKD (chronic kidney disease) stage 3, GFR 30-59 ml/min (HCC)    Depression    Disequilibrium 08/01/2019   Drug-induced hypothyroidism 03/26/2021   Dyspnea on exertion 04/03/2016   PFT 08/02/20-mild obstruction, no resp to BD, mild reduction DLCO   Essential hypertension    Fatigue 07/06/2016   Gait instability 08/01/2019   Gastroesophageal reflux disease without esophagitis    Generalized anxiety disorder 11/25/2016   Generalized weakness 08/28/2021   Hardening of the aorta (main artery of the heart) (HCC) 03/26/2021   Hemorrhoid    History of bronchitis    couple of yrs ago   History of peristent atrial fibrillation    Hoarseness 05/12/2017   Hx of colonic polyps 05/24/2014   Hyperlipidemia    takes Pravastatin daily   Hypothyroidism 04/04/2018   Long term current use of amiodarone 10/19/2017   Loss of appetite 09/03/2021   Mitral regurgitation 02/27/2016   Moderate recurrent major depression (HCC) 09/03/2021   NHL (non-Hodgkin's lymphoma) (HCC) 09/03/2021   OSA (obstructive sleep apnea) 10/19/2017   HST 09/24/2018- AHI 30.5/ hr, desaturation to 76%, body weight 249.6 lbs Unable to tolerate CPAP, failed twice.   Other specified disorders of bone density and  structure, other site 09/03/2021   PAF (paroxysmal atrial fibrillation) (HCC) 02/17/2016   Pancytopenia (HCC) 08/29/2021   Persistent atrial fibrillation (HCC) 10/31/2016   Prediabetes 09/03/2021   Primary localized osteoarthritis of left knee 09/21/2016   Primary osteoarthritis of both first carpometacarpal joints 11/06/2015   Pruritus 10/31/2016   Rectocele 09/03/2021   Rhinitis, allergic 03/19/2017   S/P lumbar laminectomy 04/05/2013   Secondary hypercoagulable state (HCC) 04/28/2021   Sensorineural hearing loss (SNHL) of both ears 08/01/2019   Severe recurrent major depression without psychotic features (HCC) 11/25/2016   Splenomegaly 08/20/2021   Syncope 09/03/2021   Upper airway cough syndrome 03/19/2017   Urinary urgency    Past Surgical History:  Procedure Laterality Date   ABDOMINAL HYSTERECTOMY  1980   partial   ABDOMINAL HYSTERECTOMY     partial   BACK SURGERY     CARDIOVERSION N/A 02/24/2016   Procedure: CARDIOVERSION;  Surgeon: Pricilla Riffle, MD;  Location: [facility] ENDOSCOPY;  Service: Cardiovascular;  Laterality: N/A;   CARDIOVERSION N/A 03/19/2016  Procedure: CARDIOVERSION;  Surgeon: Pricilla Riffle, MD;  Location: [facility] ENDOSCOPY;  Service: Cardiovascular;  Laterality: N/A;   CARDIOVERSION N/A 05/15/2021   Procedure: CARDIOVERSION;  Surgeon: Chrystie Nose, MD;  Location: [facility] ENDOSCOPY;  Service: Cardiovascular;  Laterality: N/A;   CARDIOVERSION N/A 02/06/2022   Procedure: CARDIOVERSION;  Surgeon: Maisie Fus, MD;  Location: [facility] ENDOSCOPY;  Service: Cardiovascular;  Laterality: N/A;   CARPAL TUNNEL RELEASE     CHOLECYSTECTOMY  1975   CHOLECYSTECTOMY     COLONOSCOPY     COLONOSCOPY WITH PROPOFOL  12/29/2011   Procedure: COLONOSCOPY WITH PROPOFOL;  Surgeon: Shirley Friar, MD;  Location: [facility] ENDOSCOPY;  Service: Endoscopy;  Laterality: N/A;   COLONOSCOPY WITH PROPOFOL N/A 05/24/2014   Procedure: COLONOSCOPY WITH PROPOFOL;  Surgeon: Charlott Rakes, MD;  Location: [facility] ENDOSCOPY;  Service:  Endoscopy;  Laterality: N/A;   ESOPHAGEAL MANOMETRY N/A 02/22/2017   Procedure: ESOPHAGEAL MANOMETRY (EM);  Surgeon: Charlott Rakes, MD;  Location: [facility] ENDOSCOPY;  Service: Endoscopy;  Laterality: N/A;   ESOPHAGOGASTRODUODENOSCOPY     FOOT SURGERY     left, right   FRACTURE SURGERY     left leg-knee   HEEL SPUR EXCISION Bilateral    HOT HEMOSTASIS  12/29/2011   Procedure: HOT HEMOSTASIS (ARGON PLASMA COAGULATION/BICAP);  Surgeon: Shirley Friar, MD;  Location: [facility] ENDOSCOPY;  Service: Endoscopy;  Laterality: N/A;   HOT HEMOSTASIS N/A 05/24/2014   Procedure: HOT HEMOSTASIS (ARGON PLASMA COAGULATION/BICAP);  Surgeon: Charlott Rakes, MD;  Location: [facility] ENDOSCOPY;  Service: Endoscopy;  Laterality: N/A;   IR BONE MARROW BIOPSY & ASPIRATION  08/13/2021   IR IMAGING GUIDED PORT INSERTION  09/09/2021   leg surgery d/t break Left    LUMBAR LAMINECTOMY/DECOMPRESSION MICRODISCECTOMY Left 04/05/2013   Procedure: LUMBAR FOUR TO FIVE LUMBAR LAMINECTOMY/DECOMPRESSION MICRODISCECTOMY 1 LEVEL;  Surgeon: Tia Alert, MD;  Location: [facility] NEURO ORS;  Service: Neurosurgery;  Laterality: Left;   LYMPH NODE BIOPSY Left 08/30/2021   Procedure: LYMPH NODE BIOPSY;  Surgeon: Griselda Miner, MD;  Location: [facility] ORS;  Service: General;  Laterality: Left;   NISSEN FUNDOPLICATION     PH IMPEDANCE STUDY N/A 02/22/2017   Procedure: PH IMPEDANCE STUDY;  Surgeon: Charlott Rakes, MD;  Location: [facility] ENDOSCOPY;  Service: Endoscopy;  Laterality: N/A;   TEE WITHOUT CARDIOVERSION N/A 02/24/2016   Procedure: TRANSESOPHAGEAL ECHOCARDIOGRAM (TEE);  Surgeon: Pricilla Riffle, MD;  Location: [facility] ENDOSCOPY;  Service: Cardiovascular;  Laterality: N/A;   TOTAL KNEE ARTHROPLASTY Left 10/07/2016   TOTAL KNEE ARTHROPLASTY Left 10/07/2016   Procedure: TOTAL KNEE ARTHROPLASTY;  Surgeon: Loreta Ave, MD;  Location: [facility] OR;  Service: Orthopedics;  Laterality: Left;    Allergies  Allergies  Allergen Reactions   Omnicef [Cefdinir] Swelling     Tongue swelling   Rituxan [Rituximab] Shortness Of Breath    Patient given Pepcid 20mg  IV with fluids patinet reported feeling better. Treatment was restarted patient was able to tolerate the rest of the treatment with no issues.    Bacitracin     Rash   Bacitracin-Polymyxin B     Rash   Duloxetine Hcl     felt funny- thinking messed up   Lexapro [Escitalopram]     Makes her feel funny and sleepy   Mirtazapine     weight gain in high doses    Penicillins Swelling    Tongue swell   Peroxide [Hydrogen Peroxide] Other (See Comments)    Redness.    Zoloft [Sertraline Hcl]  Makes her feel funny   Neosporin [Neomycin-Bacitracin Zn-Polymyx] Rash    Blisters, itching.     EKGs/Labs/Other Studies Reviewed:   The following studies were reviewed today:  Echo 08/09/20 demonstrated   1. Left ventricular ejection fraction, by estimation, is 60 to 65%. The  left ventricle has normal function. The left ventricle has no regional  wall motion abnormalities. Left ventricular diastolic parameters were  normal.   2. Right ventricular systolic function is normal. The right ventricular  size is normal.   3. Left atrial size was moderately dilated.   4. The mitral valve is degenerative. Mild mitral valve regurgitation. No  evidence of mitral stenosis. Moderate mitral annular calcification.   5. The aortic valve is normal in structure. Aortic valve regurgitation is  not visualized. No aortic stenosis is present.   6. The inferior vena cava is normal in size with greater than 50%  respiratory variability, suggesting right atrial pressure of 3 mmHg.        EKG:  EKG is  ordered today.  The ekg ordered today demonstrates NSR rate 62 bpm  Recent Labs: 01/08/2022: Magnesium 1.8 08/11/2022: ALT 11; BUN 15; Creatinine 1.16; Hemoglobin 13.4; Platelet Count 138; Potassium 4.1; Sodium 141  Recent Lipid Panel    Component Value Date/Time   CHOL 125 02/18/2016 0454   TRIG 90 02/18/2016 0454   HDL  41 02/18/2016 0454   CHOLHDL 3.0 02/18/2016 0454   VLDL 18 02/18/2016 0454   LDLCALC 66 02/18/2016 0454      Home Medications   Current Meds  Medication Sig   acetaminophen (TYLENOL) 500 MG tablet Take 1,000 mg by mouth every 6 (six) hours as needed for moderate pain.   albuterol (VENTOLIN HFA) 108 (90 Base) MCG/ACT inhaler Inhale 1-2 puffs into the lungs every 4 (four) hours as needed for wheezing or shortness of breath.   ALPRAZolam (XANAX) 0.5 MG tablet Take 1 tablet (0.5 mg total) by mouth 3 (three) times daily as needed for anxiety.   benzonatate (TESSALON) 200 MG capsule Take 1 capsule by mouth three times daily as needed for cough   buPROPion (WELLBUTRIN XL) 150 MG 24 hr tablet Take 150 mg by mouth daily.   Calcium 250 MG CAPS Take 250 mg by mouth at bedtime.   dextromethorphan (DELSYM) 30 MG/5ML liquid Take by mouth as needed.   dextromethorphan-guaiFENesin (MUCINEX DM) 30-600 MG 12hr tablet Take 1 tablet by mouth as needed for cough.   diltiazem (CARDIZEM CD) 240 MG 24 hr capsule Take 1 capsule (240 mg total) by mouth daily.   diltiazem (CARDIZEM) 30 MG tablet Take 1 tablet by mouth once if heart rate greater than 100bpm   fexofenadine (ALLEGRA) 180 MG tablet Take 180 mg by mouth daily.   furosemide (LASIX) 20 MG tablet Take 1 tablet (20 mg total) by mouth daily as needed for edema or fluid.   levothyroxine (SYNTHROID) 75 MCG tablet TAKE 1 TABLET DAILY BEFORE BREAKFAST (NEED APPOINTMENT FOR FURTHER REFILLS)   lidocaine-prilocaine (EMLA) cream Apply to Port-A-Cath site 1 to 2 hours prior to use   Magnesium 250 MG TABS Take 250 mg by mouth daily.   polyethylene glycol (MIRALAX) 17 g packet Take 17 g by mouth daily. (Patient taking differently: Take 17 g by mouth daily as needed.)   potassium chloride SA (KLOR-CON M) 20 MEQ tablet Take 1 tablet (20 mEq total) by mouth daily as needed (Take potassium only when you take the Furosemide (Lasix)).   pravastatin (PRAVACHOL)  40 MG tablet  Take 40 mg by mouth at bedtime.   Spacer/Aero-Holding Chambers (AEROCHAMBER MV) inhaler Use as instructed   [DISCONTINUED] apixaban (ELIQUIS) 5 MG TABS tablet Take 1 tablet (5 mg total) by mouth 2 (two) times daily.     Review of Systems      All other systems reviewed and are otherwise negative except as noted above.  Physical Exam    VS:  BP (!) 150/82   Pulse 62   Ht 5\' 7"  (1.702 m)   Wt 233 lb 6.4 oz (105.9 kg)   SpO2 97%   BMI 36.56 kg/m  , BMI Body mass index is 36.56 kg/m.  Wt Readings from Last 3 Encounters:  11/23/22 233 lb 6.4 oz (105.9 kg)  10/20/22 232 lb 9.6 oz (105.5 kg)  10/09/22 230 lb 14.4 oz (104.7 kg)     GEN: Well nourished, well developed, in no acute distress. HEENT: normal. Neck: Supple, no JVD, carotid bruits, or masses. Cardiac: IRIR, systolic murmur, rubs, or gallops. No clubbing, cyanosis, edema.  Radials/PT 2+ and equal bilaterally.  Respiratory:  Respirations regular and unlabored, clear to auscultation bilaterally. GI: Soft, nontender, nondistended. MS: No deformity or atrophy. Skin: Warm and dry, no rash. Neuro:  Strength and sensation are intact. Psych: Normal affect.  Assessment & Plan    Obstructive sleep apnea -not on CPAP -asymptomatic at this time  Atrial Fibrillation Currently on Eliquis and Cardizem 240mg  daily. Evening heart rates reported to be 90-130. Discussed the cost of Eliquis and potential for Watchman device with Dr. Lalla Brothers. -Continue Eliquis and Cardizem 240mg  daily. -Add Cardizem 30mg  as needed for heart rate >100. -Consult with Dr. Lalla Brothers regarding Watchman device on 12/29/2022. -Check back in a couple of months after consultation with Dr. Lalla Brothers.  Hypertension Elevated blood pressure noted during visit. -Continue current management and monitor blood pressure. -Blood pressure has been well-controlled her last few visits so today could be an isolated event     Disposition: Follow up 3 months with Donato Schultz, MD or APP.  Signed, Sharlene Dory, PA-C 11/23/2022, 12:53 PM  [facility]

## 2022-11-23 ENCOUNTER — Encounter: Payer: Self-pay | Admitting: Physician Assistant

## 2022-11-23 ENCOUNTER — Ambulatory Visit: Payer: Medicare HMO | Attending: Physician Assistant | Admitting: Physician Assistant

## 2022-11-23 VITALS — BP 150/82 | HR 62 | Ht 67.0 in | Wt 233.4 lb

## 2022-11-23 DIAGNOSIS — I10 Essential (primary) hypertension: Secondary | ICD-10-CM

## 2022-11-23 DIAGNOSIS — I4819 Other persistent atrial fibrillation: Secondary | ICD-10-CM

## 2022-11-23 DIAGNOSIS — R011 Cardiac murmur, unspecified: Secondary | ICD-10-CM | POA: Diagnosis not present

## 2022-11-23 DIAGNOSIS — I4821 Permanent atrial fibrillation: Secondary | ICD-10-CM

## 2022-11-23 DIAGNOSIS — G4733 Obstructive sleep apnea (adult) (pediatric): Secondary | ICD-10-CM

## 2022-11-23 MED ORDER — APIXABAN 5 MG PO TABS: 5.0000 mg | ORAL_TABLET | Freq: Two times a day (BID) | ORAL | 0 refills | Status: DC

## 2022-11-23 MED ORDER — DILTIAZEM HCL 30 MG PO TABS: ORAL_TABLET | ORAL | 1 refills | Status: DC

## 2022-11-23 NOTE — Patient Instructions (Signed)
Medication Instructions:  START Diltiazem 30mg  if HR greater than 30bpm *If you need a refill on your cardiac medications before your next appointment, please call your pharmacy*  Follow-Up: At [facility], you and your health needs are our priority.  As part of our continuing mission to provide you with exceptional heart care, we have created designated Provider Care Teams.  These Care Teams include your primary Cardiologist (physician) and Advanced Practice Providers (APPs -  Physician Assistants and Nurse Practitioners) who all work together to provide you with the care you need, when you need it.  Your next appointment:   4 month(s)  Provider:   APP

## 2022-11-24 ENCOUNTER — Other Ambulatory Visit: Payer: Self-pay

## 2022-11-27 ENCOUNTER — Other Ambulatory Visit: Payer: Self-pay | Admitting: Family Medicine

## 2022-11-27 DIAGNOSIS — I700 Atherosclerosis of aorta: Secondary | ICD-10-CM | POA: Diagnosis not present

## 2022-11-27 DIAGNOSIS — Z23 Encounter for immunization: Secondary | ICD-10-CM | POA: Diagnosis not present

## 2022-11-27 DIAGNOSIS — N2581 Secondary hyperparathyroidism of renal origin: Secondary | ICD-10-CM | POA: Diagnosis not present

## 2022-11-27 DIAGNOSIS — N183 Chronic kidney disease, stage 3 unspecified: Secondary | ICD-10-CM | POA: Diagnosis not present

## 2022-11-27 DIAGNOSIS — Z0000 Encounter for general adult medical examination without abnormal findings: Secondary | ICD-10-CM | POA: Diagnosis not present

## 2022-11-27 DIAGNOSIS — Z1231 Encounter for screening mammogram for malignant neoplasm of breast: Secondary | ICD-10-CM

## 2022-11-27 DIAGNOSIS — D6869 Other thrombophilia: Secondary | ICD-10-CM | POA: Diagnosis not present

## 2022-11-27 DIAGNOSIS — F331 Major depressive disorder, recurrent, moderate: Secondary | ICD-10-CM | POA: Diagnosis not present

## 2022-11-27 DIAGNOSIS — R7303 Prediabetes: Secondary | ICD-10-CM | POA: Diagnosis not present

## 2022-11-27 DIAGNOSIS — E032 Hypothyroidism due to medicaments and other exogenous substances: Secondary | ICD-10-CM | POA: Diagnosis not present

## 2022-11-27 DIAGNOSIS — E785 Hyperlipidemia, unspecified: Secondary | ICD-10-CM | POA: Diagnosis not present

## 2022-11-27 DIAGNOSIS — I480 Paroxysmal atrial fibrillation: Secondary | ICD-10-CM | POA: Diagnosis not present

## 2022-12-03 ENCOUNTER — Other Ambulatory Visit: Payer: Self-pay | Admitting: Family Medicine

## 2022-12-03 DIAGNOSIS — M8580 Other specified disorders of bone density and structure, unspecified site: Secondary | ICD-10-CM

## 2022-12-18 ENCOUNTER — Inpatient Hospital Stay: Payer: Medicare HMO | Attending: Oncology

## 2022-12-18 VITALS — BP 142/93 | HR 138 | Temp 97.4°F | Resp 18 | Ht 67.0 in | Wt 235.4 lb

## 2022-12-18 DIAGNOSIS — I129 Hypertensive chronic kidney disease with stage 1 through stage 4 chronic kidney disease, or unspecified chronic kidney disease: Secondary | ICD-10-CM | POA: Diagnosis not present

## 2022-12-18 DIAGNOSIS — D696 Thrombocytopenia, unspecified: Secondary | ICD-10-CM | POA: Diagnosis not present

## 2022-12-18 DIAGNOSIS — G4730 Sleep apnea, unspecified: Secondary | ICD-10-CM | POA: Insufficient documentation

## 2022-12-18 DIAGNOSIS — R5383 Other fatigue: Secondary | ICD-10-CM | POA: Diagnosis not present

## 2022-12-18 DIAGNOSIS — Z79899 Other long term (current) drug therapy: Secondary | ICD-10-CM | POA: Diagnosis not present

## 2022-12-18 DIAGNOSIS — R21 Rash and other nonspecific skin eruption: Secondary | ICD-10-CM | POA: Insufficient documentation

## 2022-12-18 DIAGNOSIS — N189 Chronic kidney disease, unspecified: Secondary | ICD-10-CM | POA: Insufficient documentation

## 2022-12-18 DIAGNOSIS — I2510 Atherosclerotic heart disease of native coronary artery without angina pectoris: Secondary | ICD-10-CM | POA: Diagnosis not present

## 2022-12-18 DIAGNOSIS — I4891 Unspecified atrial fibrillation: Secondary | ICD-10-CM | POA: Insufficient documentation

## 2022-12-18 DIAGNOSIS — R2681 Unsteadiness on feet: Secondary | ICD-10-CM | POA: Insufficient documentation

## 2022-12-18 DIAGNOSIS — C8338 Diffuse large B-cell lymphoma, lymph nodes of multiple sites: Secondary | ICD-10-CM | POA: Diagnosis not present

## 2022-12-18 DIAGNOSIS — D539 Nutritional anemia, unspecified: Secondary | ICD-10-CM | POA: Diagnosis not present

## 2022-12-18 DIAGNOSIS — Z95828 Presence of other vascular implants and grafts: Secondary | ICD-10-CM

## 2022-12-18 DIAGNOSIS — Z452 Encounter for adjustment and management of vascular access device: Secondary | ICD-10-CM | POA: Insufficient documentation

## 2022-12-18 DIAGNOSIS — R0609 Other forms of dyspnea: Secondary | ICD-10-CM | POA: Diagnosis not present

## 2022-12-18 MED ORDER — HEPARIN SOD (PORK) LOCK FLUSH 100 UNIT/ML IV SOLN
500.0000 [IU] | Freq: Once | INTRAVENOUS | Status: AC
  Administered 2022-12-18: 500 [IU] via INTRAVENOUS

## 2022-12-18 MED ORDER — SODIUM CHLORIDE 0.9% FLUSH
10.0000 mL | Freq: Once | INTRAVENOUS | Status: AC
  Administered 2022-12-18: 10 mL via INTRAVENOUS

## 2022-12-18 NOTE — Patient Instructions (Signed)

## 2022-12-25 DIAGNOSIS — L851 Acquired keratosis [keratoderma] palmaris et plantaris: Secondary | ICD-10-CM | POA: Diagnosis not present

## 2022-12-25 DIAGNOSIS — M2012 Hallux valgus (acquired), left foot: Secondary | ICD-10-CM | POA: Diagnosis not present

## 2022-12-25 DIAGNOSIS — M19071 Primary osteoarthritis, right ankle and foot: Secondary | ICD-10-CM | POA: Diagnosis not present

## 2022-12-25 DIAGNOSIS — M2011 Hallux valgus (acquired), right foot: Secondary | ICD-10-CM | POA: Diagnosis not present

## 2022-12-25 DIAGNOSIS — M792 Neuralgia and neuritis, unspecified: Secondary | ICD-10-CM | POA: Diagnosis not present

## 2022-12-25 DIAGNOSIS — M810 Age-related osteoporosis without current pathological fracture: Secondary | ICD-10-CM | POA: Diagnosis not present

## 2022-12-25 DIAGNOSIS — M19072 Primary osteoarthritis, left ankle and foot: Secondary | ICD-10-CM | POA: Diagnosis not present

## 2022-12-26 ENCOUNTER — Other Ambulatory Visit: Payer: Self-pay

## 2022-12-29 ENCOUNTER — Ambulatory Visit: Payer: Medicare HMO | Admitting: Cardiology

## 2022-12-30 ENCOUNTER — Ambulatory Visit: Payer: Medicare HMO | Admitting: Cardiology

## 2022-12-31 DIAGNOSIS — M65332 Trigger finger, left middle finger: Secondary | ICD-10-CM | POA: Diagnosis not present

## 2022-12-31 DIAGNOSIS — M65331 Trigger finger, right middle finger: Secondary | ICD-10-CM | POA: Diagnosis not present

## 2022-12-31 DIAGNOSIS — M6530 Trigger finger, unspecified finger: Secondary | ICD-10-CM

## 2022-12-31 HISTORY — DX: Trigger finger, unspecified finger: M65.30

## 2022-12-31 LAB — MOLECULAR PATHOLOGY

## 2023-01-02 ENCOUNTER — Encounter: Payer: Self-pay | Admitting: Cardiology

## 2023-01-04 ENCOUNTER — Other Ambulatory Visit: Payer: Self-pay

## 2023-01-04 DIAGNOSIS — I4821 Permanent atrial fibrillation: Secondary | ICD-10-CM

## 2023-01-04 MED ORDER — APIXABAN 5 MG PO TABS: 5.0000 mg | ORAL_TABLET | Freq: Two times a day (BID) | ORAL | 0 refills | Status: DC

## 2023-01-04 NOTE — Telephone Encounter (Signed)
Prescription refill request for Eliquis received. Indication:afib Last office visit:11/24 Scr:1.16  7/24 Age: 77 Weight:106.8  kg  Prescription refilled

## 2023-01-11 ENCOUNTER — Inpatient Hospital Stay: Payer: Medicare HMO

## 2023-01-11 ENCOUNTER — Other Ambulatory Visit: Payer: Medicare HMO

## 2023-01-11 ENCOUNTER — Inpatient Hospital Stay: Payer: Medicare HMO | Admitting: Oncology

## 2023-01-11 VITALS — BP 132/97 | HR 86 | Temp 98.2°F | Resp 20 | Ht 67.0 in | Wt 233.1 lb

## 2023-01-11 DIAGNOSIS — Z452 Encounter for adjustment and management of vascular access device: Secondary | ICD-10-CM | POA: Diagnosis not present

## 2023-01-11 DIAGNOSIS — R0609 Other forms of dyspnea: Secondary | ICD-10-CM | POA: Diagnosis not present

## 2023-01-11 DIAGNOSIS — C8598 Non-Hodgkin lymphoma, unspecified, lymph nodes of multiple sites: Secondary | ICD-10-CM

## 2023-01-11 DIAGNOSIS — I4891 Unspecified atrial fibrillation: Secondary | ICD-10-CM | POA: Diagnosis not present

## 2023-01-11 DIAGNOSIS — R2681 Unsteadiness on feet: Secondary | ICD-10-CM | POA: Diagnosis not present

## 2023-01-11 DIAGNOSIS — C8338 Diffuse large B-cell lymphoma, lymph nodes of multiple sites: Secondary | ICD-10-CM | POA: Diagnosis not present

## 2023-01-11 DIAGNOSIS — G4730 Sleep apnea, unspecified: Secondary | ICD-10-CM | POA: Diagnosis not present

## 2023-01-11 DIAGNOSIS — N189 Chronic kidney disease, unspecified: Secondary | ICD-10-CM | POA: Diagnosis not present

## 2023-01-11 DIAGNOSIS — R5383 Other fatigue: Secondary | ICD-10-CM | POA: Diagnosis not present

## 2023-01-11 DIAGNOSIS — I129 Hypertensive chronic kidney disease with stage 1 through stage 4 chronic kidney disease, or unspecified chronic kidney disease: Secondary | ICD-10-CM | POA: Diagnosis not present

## 2023-01-11 LAB — CBC WITH DIFFERENTIAL (CANCER CENTER ONLY)
Abs Immature Granulocytes: 0.02 10*3/uL (ref 0.00–0.07)
Basophils Absolute: 0 10*3/uL (ref 0.0–0.1)
Basophils Relative: 1 %
Eosinophils Absolute: 0.1 10*3/uL (ref 0.0–0.5)
Eosinophils Relative: 1 %
HCT: 42.2 % (ref 36.0–46.0)
Hemoglobin: 14.3 g/dL (ref 12.0–15.0)
Immature Granulocytes: 0 %
Lymphocytes Relative: 31 %
Lymphs Abs: 1.7 10*3/uL (ref 0.7–4.0)
MCH: 33.5 pg (ref 26.0–34.0)
MCHC: 33.9 g/dL (ref 30.0–36.0)
MCV: 98.8 fL (ref 80.0–100.0)
Monocytes Absolute: 0.5 10*3/uL (ref 0.1–1.0)
Monocytes Relative: 10 %
Neutro Abs: 3.1 10*3/uL (ref 1.7–7.7)
Neutrophils Relative %: 57 %
Platelet Count: 163 10*3/uL (ref 150–400)
RBC: 4.27 MIL/uL (ref 3.87–5.11)
RDW: 13.5 % (ref 11.5–15.5)
WBC Count: 5.4 10*3/uL (ref 4.0–10.5)
nRBC: 0 % (ref 0.0–0.2)

## 2023-01-11 LAB — CMP (CANCER CENTER ONLY)
ALT: 13 U/L (ref 0–44)
AST: 13 U/L — ABNORMAL LOW (ref 15–41)
Albumin: 3.9 g/dL (ref 3.5–5.0)
Alkaline Phosphatase: 72 U/L (ref 38–126)
Anion gap: 5 (ref 5–15)
BUN: 23 mg/dL (ref 8–23)
CO2: 29 mmol/L (ref 22–32)
Calcium: 9.1 mg/dL (ref 8.9–10.3)
Chloride: 104 mmol/L (ref 98–111)
Creatinine: 1.25 mg/dL — ABNORMAL HIGH (ref 0.44–1.00)
GFR, Estimated: 45 mL/min — ABNORMAL LOW (ref >60)
Glucose, Bld: 123 mg/dL — ABNORMAL HIGH (ref 70–99)
Potassium: 4.3 mmol/L (ref 3.5–5.1)
Sodium: 138 mmol/L (ref 135–145)
Total Bilirubin: 0.5 mg/dL (ref 0.0–1.2)
Total Protein: 6.2 g/dL — ABNORMAL LOW (ref 6.5–8.1)

## 2023-01-11 LAB — LACTATE DEHYDROGENASE: LDH: 150 U/L (ref 98–192)

## 2023-01-11 NOTE — H&P (View-Only) (Signed)
 [facility] OFFICE PROGRESS NOTE   Diagnosis: Non-Hodgkin's lymphoma  INTERVAL HISTORY:   [name] [name] returns as scheduled.  She feels well.  No sweats or rash.  Good appetite.  No complaint.  Objective:  Vital signs in last 24 hours:  Blood pressure (!) 132/97, pulse 86, temperature 98.2 F (36.8 C), temperature source Temporal, resp. rate 20, height 5\' 7"  (1.702 m), weight 233 lb 1.6 oz (105.7 kg), SpO2 96%.    Lymphatics: No cervical, supraclavicular, axillary, or inguinal nodes.  Prominent soft tissue fullness at the left posterior neck appears to be muscular. Resp: Lungs clear bilaterally Cardio: Irregular GI: No hepatosplenomegaly Vascular: No leg edema   Portacath/PICC-without erythema  Lab Results:  Lab Results  Component Value Date   WBC 5.4 01/11/2023   HGB 14.3 01/11/2023   HCT 42.2 01/11/2023   MCV 98.8 01/11/2023   PLT 163 01/11/2023   NEUTROABS 3.1 01/11/2023    CMP  Lab Results  Component Value Date   NA 141 08/11/2022   K 4.1 08/11/2022   CL 108 08/11/2022   CO2 28 08/11/2022   GLUCOSE 117 (H) 08/11/2022   BUN 15 08/11/2022   CREATININE 1.16 (H) 08/11/2022   CALCIUM 9.0 08/11/2022   PROT 6.2 (L) 08/11/2022   ALBUMIN 4.0 08/11/2022   AST 16 08/11/2022   ALT 11 08/11/2022   ALKPHOS 73 08/11/2022   BILITOT 0.4 08/11/2022   GFRNONAA 49 (L) 08/11/2022   GFRAA 46 (L) 11/15/2017    No results found for: "CEA1", "CEA", "OZH086", "CA125"  Lab Results  Component Value Date   INR 1.6 (H) 08/30/2021   LABPROT 19.2 (H) 08/30/2021    Imaging:  No results found.  Medications: I have reviewed the patient's current medications.   Assessment/Plan:  Large B-cell lymphoma presenting with macrocytic anemia/thrombocytopenia -08/13/2020 B12 3500 -08/13/2020 LDH 612 -08/13/2020 myeloma panel-no monoclonal protein  -Bone marrow biopsy 08/20/2020-hypercellular marrow with erythroid hyperplasia and dyspoiesis, rare lipogranuloma like  lesions.  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  Negative myeloma FISH panel, 46XX; neotype myeloid disorders profile NGS sequencing-no pathogenic mutations detected in any of the genes on the NGS panel -PNH screen - 08/26/2020 -CTs 02/17/2021-new splenomegaly; diffuse bilateral bronchial wall thickening; clustered groundglass and fine nodular opacity in the dependent right lower lobe.  CAD. -Bone marrow biopsy 08/13/2021-hypercellular bone marrow for age with trilineage hematopoiesis; several atypical lymphoid aggregates present; no increase in blastic cells; flow cytometry without significant T or B-cell abnormalities; normal cytogenetics; "the limited morphologic and immunohistochemical features are atypical and a lymphoproliferative process is not excluded" -PET scan 08/27/2021-widespread hypermetabolic lymphadenopathy in the neck, left axilla, mediastinum, retroperitoneum, and left pelvis.  Hypermetabolic skeletal lesions, generalized bilateral pulmonary hypermetabolism -08/30/2021-1 unit RBCs -Excisional lymph node biopsy left groin 08/30/2021-Large B-cell lymphoma, CD20 positive, CD30 positive; FISH analysis-no evidence of a double/triple hit lymphoma -Cycle 1 R-CHOP 09/05/2021, Granix 09/06/2021 for 7 days -Cycle 2 R-CHOP 09/29/2021, Udenyca -Cycle 3 R-CHOP 10/20/2021, Udenyca -Cycle 4 R-CHOP 11/10/2021, Udenyca -PET 11/21/2021-complete resolution of hypermetabolic lymphadenopathy in some areas and marked decrease in hypermetabolic lymphadenopathy and others, Deauville 1-2, diffuse groundglass lung opacity with hypermetabolism has resolved, decrease in splenomegaly -Cycle 5 R-CHOP 12/11/2021 -Cycle 6 R-CHOP 01/08/2022 Fatigue Dyspnea on exertion Unsteady gait/balance disorder, followed by Dr. Everlena Cooper CKD Atrial fibrillation Hypertension Sleep apnea History of anorexia/weight loss 06/13/2021 skin rash, question vasculitis-course of prednisone initiated; 06/20/2021 marked  improvement, rash recurred when she was tapered off of prednisone, prednisone resumed by dermatology  History of coagulopathy-most likely related to malnutrition and apixaban Port-A-Cath placement 09/09/2021 Left groin seroma following the left inguinal lymph node biopsy-drained in interventional radiology June 2024 and by Dr. Carolynne Edouard 07/17/2022      Disposition: [name] [name] is in clinical remission from non-Hodgkin's lymphoma.  A Port-A-Cath remains in place.  She would like to keep the Port-A-Cath now.  She will return for a Port-A-Cath flush in 8 weeks and an office visit in 16 weeks.  Thornton Papas, MD  01/11/2023  10:34 AM

## 2023-01-11 NOTE — Progress Notes (Signed)
[facility] OFFICE PROGRESS NOTE   Diagnosis: Non-Hodgkin's lymphoma  INTERVAL HISTORY:   Ms. Marking returns as scheduled.  She feels well.  No sweats or rash.  Good appetite.  No complaint.  Objective:  Vital signs in last 24 hours:  Blood pressure (!) 132/97, pulse 86, temperature 98.2 F (36.8 C), temperature source Temporal, resp. rate 20, height 5\' 7"  (1.702 m), weight 233 lb 1.6 oz (105.7 kg), SpO2 96%.    Lymphatics: No cervical, supraclavicular, axillary, or inguinal nodes.  Prominent soft tissue fullness at the left posterior neck appears to be muscular. Resp: Lungs clear bilaterally Cardio: Irregular GI: No hepatosplenomegaly Vascular: No leg edema   Portacath/PICC-without erythema  Lab Results:  Lab Results  Component Value Date   WBC 5.4 01/11/2023   HGB 14.3 01/11/2023   HCT 42.2 01/11/2023   MCV 98.8 01/11/2023   PLT 163 01/11/2023   NEUTROABS 3.1 01/11/2023    CMP  Lab Results  Component Value Date   NA 141 08/11/2022   K 4.1 08/11/2022   CL 108 08/11/2022   CO2 28 08/11/2022   GLUCOSE 117 (H) 08/11/2022   BUN 15 08/11/2022   CREATININE 1.16 (H) 08/11/2022   CALCIUM 9.0 08/11/2022   PROT 6.2 (L) 08/11/2022   ALBUMIN 4.0 08/11/2022   AST 16 08/11/2022   ALT 11 08/11/2022   ALKPHOS 73 08/11/2022   BILITOT 0.4 08/11/2022   GFRNONAA 49 (L) 08/11/2022   GFRAA 46 (L) 11/15/2017    No results found for: "CEA1", "CEA", "OZH086", "CA125"  Lab Results  Component Value Date   INR 1.6 (H) 08/30/2021   LABPROT 19.2 (H) 08/30/2021    Imaging:  No results found.  Medications: I have reviewed the patient's current medications.   Assessment/Plan:  Large B-cell lymphoma presenting with macrocytic anemia/thrombocytopenia -08/13/2020 B12 3500 -08/13/2020 LDH 612 -08/13/2020 myeloma panel-no monoclonal protein  -Bone marrow biopsy 08/20/2020-hypercellular marrow with erythroid hyperplasia and dyspoiesis, rare lipogranuloma like  lesions.  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  Negative myeloma FISH panel, 46XX; neotype myeloid disorders profile NGS sequencing-no pathogenic mutations detected in any of the genes on the NGS panel -PNH screen - 08/26/2020 -CTs 02/17/2021-new splenomegaly; diffuse bilateral bronchial wall thickening; clustered groundglass and fine nodular opacity in the dependent right lower lobe.  CAD. -Bone marrow biopsy 08/13/2021-hypercellular bone marrow for age with trilineage hematopoiesis; several atypical lymphoid aggregates present; no increase in blastic cells; flow cytometry without significant T or B-cell abnormalities; normal cytogenetics; "the limited morphologic and immunohistochemical features are atypical and a lymphoproliferative process is not excluded" -PET scan 08/27/2021-widespread hypermetabolic lymphadenopathy in the neck, left axilla, mediastinum, retroperitoneum, and left pelvis.  Hypermetabolic skeletal lesions, generalized bilateral pulmonary hypermetabolism -08/30/2021-1 unit RBCs -Excisional lymph node biopsy left groin 08/30/2021-Large B-cell lymphoma, CD20 positive, CD30 positive; FISH analysis-no evidence of a double/triple hit lymphoma -Cycle 1 R-CHOP 09/05/2021, Granix 09/06/2021 for 7 days -Cycle 2 R-CHOP 09/29/2021, Udenyca -Cycle 3 R-CHOP 10/20/2021, Udenyca -Cycle 4 R-CHOP 11/10/2021, Udenyca -PET 11/21/2021-complete resolution of hypermetabolic lymphadenopathy in some areas and marked decrease in hypermetabolic lymphadenopathy and others, Deauville 1-2, diffuse groundglass lung opacity with hypermetabolism has resolved, decrease in splenomegaly -Cycle 5 R-CHOP 12/11/2021 -Cycle 6 R-CHOP 01/08/2022 Fatigue Dyspnea on exertion Unsteady gait/balance disorder, followed by Dr. Everlena Cooper CKD Atrial fibrillation Hypertension Sleep apnea History of anorexia/weight loss 06/13/2021 skin rash, question vasculitis-course of prednisone initiated; 06/20/2021 marked  improvement, rash recurred when she was tapered off of prednisone, prednisone resumed by dermatology  History of coagulopathy-most likely related to malnutrition and apixaban Port-A-Cath placement 09/09/2021 Left groin seroma following the left inguinal lymph node biopsy-drained in interventional radiology June 2024 and by Dr. Carolynne Edouard 07/17/2022      Disposition: [name] [name] is in clinical remission from non-Hodgkin's lymphoma.  A Port-A-Cath remains in place.  She would like to keep the Port-A-Cath now.  She will return for a Port-A-Cath flush in 8 weeks and an office visit in 16 weeks.  Thornton Papas, MD  01/11/2023  10:34 AM

## 2023-01-12 ENCOUNTER — Ambulatory Visit: Payer: Medicare HMO | Attending: Cardiology | Admitting: Cardiology

## 2023-01-12 ENCOUNTER — Encounter: Payer: Self-pay | Admitting: Cardiology

## 2023-01-12 VITALS — BP 138/86 | HR 74 | Ht 67.0 in | Wt 233.8 lb

## 2023-01-12 DIAGNOSIS — C8590 Non-Hodgkin lymphoma, unspecified, unspecified site: Secondary | ICD-10-CM | POA: Diagnosis not present

## 2023-01-12 DIAGNOSIS — I4821 Permanent atrial fibrillation: Secondary | ICD-10-CM

## 2023-01-12 DIAGNOSIS — I5032 Chronic diastolic (congestive) heart failure: Secondary | ICD-10-CM | POA: Diagnosis not present

## 2023-01-12 NOTE — Progress Notes (Addendum)
 Electrophysiology Office Follow up Visit Note:    Date:  01/12/2023   ID:  [name] [name], DOB Sep 20, 1945, MRN [id number]  PCP:  Teresa Channel, MD  [facility] Cardiologist:  Oneil Parchment, MD  [facility] Electrophysiologist:  OLE ONEIDA HOLTS, MD    Interval History:     [name] [name] is a 77 y.o. female who presents for a follow up visit.   I last saw the patient in February 09, 2022.  At that time she was finishing treatment for large B-cell lymphoma. The patient saw Dr. Parchment October 20, 2022.  At that appointment she reported worsening shortness of breath.  Severe limitation in her exercise capacity.  She was off amiodarone  given interaction with her chemo. She has had difficulty affording Eliquis  in the past and is interested in pursuing left atrial appendage occlusion if a candidate.  In the past watchman was discussed but given her comorbidities, further workup was deferred.  She saw her oncologist January 11, 2023.  In their note they report her being in clinical remission from non-Hodgkin's lymphoma.  She has a Port-A-Cath in place.  She is doing well today.  She is with her husband in clinic.  She confirms that she is in remission from her lymphoma.  They are planning on blood work every 6 months for surveillance.  She still has her port in place.      Past medical, surgical, social and family history were reviewed.  ROS:   Please see the history of present illness.    All other systems reviewed and are negative.  EKGs/Labs/Other Studies Reviewed:    The following studies were reviewed today:  November 21, 2021 echo EF 60-65 RV normal Moderately dilated left atrium Mild MR   CT cardiac from May 2023 reviewed and demonstrates anatomy suitable for watchman implant.      Physical Exam:    VS:  BP 138/86 (BP Location: Left Arm, Patient Position: Sitting, Cuff Size: Large)   Pulse 74   Ht 5' 7 (1.702 m)   Wt 233 lb 12.8 oz (106.1 kg)   SpO2 95%    BMI 36.62 kg/m     Wt Readings from Last 3 Encounters:  01/12/23 233 lb 12.8 oz (106.1 kg)  01/11/23 233 lb 1.6 oz (105.7 kg)  12/18/22 235 lb 6.4 oz (106.8 kg)     GEN: no distress CARD: Irregularly irregular, No MRG RESP: No IWOB. CTAB.      ASSESSMENT:    1. Permanent atrial fibrillation (HCC)   2. Non-Hodgkin's lymphoma, unspecified body region, unspecified non-Hodgkin lymphoma type (HCC)   3. Chronic diastolic heart failure (HCC)    PLAN:    In order of problems listed above:  #Atrial fibrillation Permanent.  On rate control.  Currently on Eliquis  for stroke prophylaxis but prefers a stroke risk mitigation strategy that avoids long-term exposure anticoagulation given the risks of bleeding and excessive costs.  She is also required blood transfusion in the past while undergoing treatment for her lymphoma.  Her lymphoma is in clinical remission.  Her blood counts have recovered.  I think she is at acceptable risk to undergo watchman implant.  ---------------  I have seen [name] [name] in the office today who is being considered for a Watchman left atrial appendage closure device. I believe they will benefit from this procedure given their history of atrial fibrillation, CHA2DS2-VASc score of 5 and unadjusted ischemic stroke rate of 7.2% per year. Unfortunately, the patient is  not felt to be a long term anticoagulation candidate secondary to a strong preference to avoid long-term exposure anticoagulation.. The patient's chart has been reviewed and I feel that they would be a candidate for short term oral anticoagulation after Watchman implant.   It is my belief that after undergoing a LAA closure procedure, [name] [name] will not need long term anticoagulation which eliminates anticoagulation side effects and major bleeding risk.   Procedural risks for the Watchman implant have been reviewed with the patient including a 0.5% risk of stroke, <1% risk of perforation and <1% risk  of device embolization. Other risks include bleeding, vascular damage, tamponade, worsening renal function, and death. The patient understands these risk and wishes to proceed.     The published clinical data on the safety and effectiveness of WATCHMAN include but are not limited to the following: - Holmes DR, Jess BEARD, Sick P et al. for the PROTECT AF Investigators. Percutaneous closure of the left atrial appendage versus warfarin therapy for prevention of stroke in patients with atrial fibrillation: a randomised non-inferiority trial. Lancet 2009; 374: 534-42. GLENWOOD Jess BEARD, Doshi SK, Jonita VEAR Satchel D et al. on behalf of the PROTECT AF Investigators. Percutaneous Left Atrial Appendage Closure for Stroke Prophylaxis in Patients With Atrial Fibrillation 2.3-Year Follow-up of the PROTECT AF (Watchman Left Atrial Appendage System for Embolic Protection in Patients With Atrial Fibrillation) Trial. Circulation 2013; 127:720-729. - Alli O, Doshi S,  Kar S, Reddy VY, Sievert H et al. Quality of Life Assessment in the Randomized PROTECT AF (Percutaneous Closure of the Left Atrial Appendage Versus Warfarin Therapy for Prevention of Stroke in Patients With Atrial Fibrillation) Trial of Patients at Risk for Stroke With Nonvalvular Atrial Fibrillation. J Am Coll Cardiol 2013; 61:1790-8. GLENWOOD Satchel DR, Archer RAMAN, Price M, Whisenant B, Sievert H, Doshi S, Huber K, Reddy V. Prospective randomized evaluation of the Watchman left atrial appendage Device in patients with atrial fibrillation versus long-term warfarin therapy; the PREVAIL trial. Journal of the [employer] of Cardiology, Vol. 4, No. 1, 2014, 1-11. - Kar S, Doshi SK, Sadhu A, Horton R, Osorio J et al. Primary outcome evaluation of a next-generation left atrial appendage closure device: results from the PINNACLE FLX trial. Circulation 2021;143(18)1754-1762.    After today's visit with the patient which was dedicated solely for shared decision making visit  regarding LAA closure device, the patient decided to proceed with the LAA appendage closure procedure scheduled to be done in the near future at [facility].  Plan for TEE on the table today the procedure.  Prior CT is acceptable for initial appendage morphology assessment.  HAS-BLED score 2 Hypertension Yes  Abnormal renal and liver function (Dialysis, transplant, Cr >2.26 mg/dL /Cirrhosis or Bilirubin >2x Normal or AST/ALT/AP >3x Normal) No  Stroke No  Bleeding No  Labile INR (Unstable/high INR) No  Elderly (>65) Yes  Drugs or alcohol (>= 8 drinks/week, anti-plt or NSAID) No   CHA2DS2-VASc Score = 5  The patient's score is based upon: CHF History: 1 HTN History: 1 Diabetes History: 0 Stroke History: 0 Vascular Disease History: 0 Age Score: 2 Gender Score: 1  #Hypertension At goal today.  Recommend checking blood pressures 1-2 times per week at home and recording the values.  Recommend bringing these recordings to the primary care physician.  #Chronic diastolic heart failure Continue Lasix  for volume control. NYHA Class I symptoms     Signed, Ole Holts, MD, [facility], [facility] 01/12/2023 3:23 PM  Electrophysiology [facility]

## 2023-01-12 NOTE — Patient Instructions (Addendum)
 Medication Instructions:  Your physician recommends that you continue on your current medications as directed. Please refer to the Current Medication list given to you today.  *If you need a refill on your cardiac medications before your next appointment, please call your pharmacy*  Testing/Procedures: Watchman Your physician has requested that you have Left atrial appendage (LAA) closure device implantation is a procedure to put a small device in the LAA of the heart. The LAA is a small sac in the wall of the heart's left upper chamber. Blood clots can form in this area. The device, Watchman closes the LAA to help prevent a blood clot and stroke.   Follow-Up: At [facility], you and your health needs are our priority.  As part of our continuing mission to provide you with exceptional heart care, we have created designated Provider Care Teams.  These Care Teams include your primary Cardiologist (physician) and Advanced Practice Providers (APPs -  Physician Assistants and Nurse Practitioners) who all work together to provide you with the care you need, when you need it.  Your next appointment:   You will be contacted by Nurse Navigator, Rockie Redman to schedule your pre-procedure visit and procedure date. If you have any questions she can be reached at [phone].

## 2023-01-19 ENCOUNTER — Other Ambulatory Visit: Payer: Self-pay | Admitting: Cardiology

## 2023-01-19 DIAGNOSIS — I4821 Permanent atrial fibrillation: Secondary | ICD-10-CM

## 2023-01-19 NOTE — Telephone Encounter (Signed)
 Prescription refill request for Eliquis received. Indication:afib Last office visit:12/24 Scr:1.25  12/24 Age: 78 Weight:106.1  kg  Prescription refilled

## 2023-02-02 ENCOUNTER — Telehealth: Payer: Self-pay

## 2023-02-02 ENCOUNTER — Encounter: Payer: Self-pay | Admitting: Oncology

## 2023-02-02 ENCOUNTER — Other Ambulatory Visit: Payer: Self-pay

## 2023-02-02 DIAGNOSIS — D649 Anemia, unspecified: Secondary | ICD-10-CM

## 2023-02-02 DIAGNOSIS — C8590 Non-Hodgkin lymphoma, unspecified, unspecified site: Secondary | ICD-10-CM

## 2023-02-02 DIAGNOSIS — D469 Myelodysplastic syndrome, unspecified: Secondary | ICD-10-CM

## 2023-02-02 DIAGNOSIS — I4821 Permanent atrial fibrillation: Secondary | ICD-10-CM

## 2023-02-02 NOTE — Telephone Encounter (Signed)
Due to last minute cancellation, offered the patient LAAO on 02/04/2023. She was elated and gladly accepted the spot. She did understand that precertification is not complete so the procedure date is not yet official, but she will be called tomorrow AM to confirm or cancel procedure. She was grateful for call and agreed with plan.

## 2023-02-03 ENCOUNTER — Encounter: Payer: Self-pay | Admitting: Oncology

## 2023-02-03 ENCOUNTER — Encounter (HOSPITAL_COMMUNITY): Payer: Self-pay | Admitting: Cardiology

## 2023-02-03 NOTE — Telephone Encounter (Signed)
Confirmed procedure with patient tomorrow.  Reviewed instructions.  She was grateful for call and agreed with plan.

## 2023-02-03 NOTE — Progress Notes (Signed)
PCP - Dr Laurann Montana Cardiologist - Dr Donato Schultz EP - Dr Steffanie Dunn Oncology -   Chest x-ray - DOS -2 view EKG - DOS (02/04/23) Stress Test - 07/10/16 ECHO TEE - DOS Cardiac Cath - n/a  ICD Pacemaker/Loop - n/a  Sleep Study -  Yes, 2018 CPAP - does not use CPAP  Diabetes - n/a  Blood Thinner Instructions:  Follow your surgeon's Eliquis instructions for upcoming procedure on 02/04/23.  Aspirin Instructions: n/a  NPO   Anesthesia review: No, Left Atrial Appendage Procedure

## 2023-02-03 NOTE — Telephone Encounter (Signed)
Received confirmation from the precert department that [name] [name] may proceed with LAAO tomorrow.  Attempted to call [name] [name] to confirm and review instructions - left message to call back ASAP.  Sent MyChart message to call back as well.

## 2023-02-04 ENCOUNTER — Encounter (HOSPITAL_COMMUNITY)
Admission: RE | Disposition: A | Discharge status: Home / Self Care | Payer: Self-pay | Source: Home / Self Care | Attending: Cardiology

## 2023-02-04 ENCOUNTER — Inpatient Hospital Stay (HOSPITAL_COMMUNITY): Payer: HMO

## 2023-02-04 ENCOUNTER — Encounter (HOSPITAL_COMMUNITY): Payer: Self-pay | Admitting: Cardiology

## 2023-02-04 ENCOUNTER — Inpatient Hospital Stay (HOSPITAL_COMMUNITY): Payer: HMO | Admitting: Anesthesiology

## 2023-02-04 ENCOUNTER — Other Ambulatory Visit: Payer: Self-pay

## 2023-02-04 ENCOUNTER — Inpatient Hospital Stay (HOSPITAL_COMMUNITY)
Admission: RE | Admit: 2023-02-04 | Discharge: 2023-02-04 | DRG: 274 | Disposition: A | Discharge status: Home / Self Care | Payer: HMO | Attending: Cardiology | Admitting: Cardiology

## 2023-02-04 DIAGNOSIS — Z8572 Personal history of non-Hodgkin lymphomas: Secondary | ICD-10-CM | POA: Diagnosis not present

## 2023-02-04 DIAGNOSIS — I129 Hypertensive chronic kidney disease with stage 1 through stage 4 chronic kidney disease, or unspecified chronic kidney disease: Secondary | ICD-10-CM | POA: Diagnosis present

## 2023-02-04 DIAGNOSIS — Z006 Encounter for examination for normal comparison and control in clinical research program: Secondary | ICD-10-CM

## 2023-02-04 DIAGNOSIS — Q2112 Patent foramen ovale: Secondary | ICD-10-CM | POA: Diagnosis not present

## 2023-02-04 DIAGNOSIS — Z95818 Presence of other cardiac implants and grafts: Secondary | ICD-10-CM

## 2023-02-04 DIAGNOSIS — I2510 Atherosclerotic heart disease of native coronary artery without angina pectoris: Secondary | ICD-10-CM | POA: Diagnosis present

## 2023-02-04 DIAGNOSIS — Z880 Allergy status to penicillin: Secondary | ICD-10-CM

## 2023-02-04 DIAGNOSIS — D469 Myelodysplastic syndrome, unspecified: Secondary | ICD-10-CM

## 2023-02-04 DIAGNOSIS — I4821 Permanent atrial fibrillation: Secondary | ICD-10-CM

## 2023-02-04 DIAGNOSIS — E785 Hyperlipidemia, unspecified: Secondary | ICD-10-CM | POA: Diagnosis present

## 2023-02-04 DIAGNOSIS — N1831 Chronic kidney disease, stage 3a: Secondary | ICD-10-CM | POA: Diagnosis present

## 2023-02-04 DIAGNOSIS — I10 Essential (primary) hypertension: Secondary | ICD-10-CM | POA: Diagnosis present

## 2023-02-04 DIAGNOSIS — I071 Rheumatic tricuspid insufficiency: Secondary | ICD-10-CM

## 2023-02-04 DIAGNOSIS — Z7901 Long term (current) use of anticoagulants: Secondary | ICD-10-CM

## 2023-02-04 DIAGNOSIS — G4733 Obstructive sleep apnea (adult) (pediatric): Secondary | ICD-10-CM | POA: Diagnosis present

## 2023-02-04 DIAGNOSIS — E039 Hypothyroidism, unspecified: Secondary | ICD-10-CM | POA: Diagnosis not present

## 2023-02-04 DIAGNOSIS — I081 Rheumatic disorders of both mitral and tricuspid valves: Secondary | ICD-10-CM | POA: Diagnosis present

## 2023-02-04 DIAGNOSIS — Z888 Allergy status to other drugs, medicaments and biological substances status: Secondary | ICD-10-CM | POA: Diagnosis not present

## 2023-02-04 DIAGNOSIS — I482 Chronic atrial fibrillation, unspecified: Secondary | ICD-10-CM | POA: Diagnosis not present

## 2023-02-04 DIAGNOSIS — I4891 Unspecified atrial fibrillation: Principal | ICD-10-CM | POA: Diagnosis present

## 2023-02-04 DIAGNOSIS — Z9221 Personal history of antineoplastic chemotherapy: Secondary | ICD-10-CM

## 2023-02-04 DIAGNOSIS — Z7902 Long term (current) use of antithrombotics/antiplatelets: Secondary | ICD-10-CM | POA: Diagnosis not present

## 2023-02-04 DIAGNOSIS — F418 Other specified anxiety disorders: Secondary | ICD-10-CM

## 2023-02-04 DIAGNOSIS — D649 Anemia, unspecified: Secondary | ICD-10-CM

## 2023-02-04 DIAGNOSIS — C8590 Non-Hodgkin lymphoma, unspecified, unspecified site: Secondary | ICD-10-CM | POA: Diagnosis present

## 2023-02-04 HISTORY — PX: LEFT ATRIAL APPENDAGE OCCLUSION: EP1229

## 2023-02-04 HISTORY — DX: Presence of other cardiac implants and grafts: Z95.818

## 2023-02-04 HISTORY — PX: TRANSESOPHAGEAL ECHOCARDIOGRAM (CATH LAB): EP1270

## 2023-02-04 LAB — SURGICAL PCR SCREEN
MRSA, PCR: NEGATIVE
Staphylococcus aureus: NEGATIVE

## 2023-02-04 LAB — POCT ACTIVATED CLOTTING TIME: Activated Clotting Time: 412 s

## 2023-02-04 LAB — TYPE AND SCREEN
ABO/RH(D): A POS
Antibody Screen: NEGATIVE

## 2023-02-04 LAB — ECHO TEE

## 2023-02-04 SURGERY — LEFT ATRIAL APPENDAGE OCCLUSION
Anesthesia: General

## 2023-02-04 MED ORDER — PROPOFOL 10 MG/ML IV BOLUS
INTRAVENOUS | Status: DC | PRN
  Administered 2023-02-04: 50 mg via INTRAVENOUS
  Administered 2023-02-04: 150 mg via INTRAVENOUS

## 2023-02-04 MED ORDER — ONDANSETRON HCL 4 MG/2ML IJ SOLN: 4.0000 mg | Freq: Four times a day (QID) | INTRAMUSCULAR | Status: DC | PRN

## 2023-02-04 MED ORDER — FENTANYL CITRATE (PF) 100 MCG/2ML IJ SOLN
INTRAMUSCULAR | Status: AC
  Filled 2023-02-04: qty 2

## 2023-02-04 MED ORDER — DEXAMETHASONE SODIUM PHOSPHATE 10 MG/ML IJ SOLN
INTRAMUSCULAR | Status: DC | PRN
  Administered 2023-02-04: 10 mg via INTRAVENOUS

## 2023-02-04 MED ORDER — HEPARIN (PORCINE) IN NACL 1000-0.9 UT/500ML-% IV SOLN
INTRAVENOUS | Status: DC | PRN
  Administered 2023-02-04: 500 mL

## 2023-02-04 MED ORDER — ACETAMINOPHEN 325 MG PO TABS: 650.0000 mg | ORAL_TABLET | ORAL | Status: DC | PRN

## 2023-02-04 MED ORDER — HEPARIN SODIUM (PORCINE) 1000 UNIT/ML IJ SOLN
INTRAMUSCULAR | Status: DC | PRN
  Administered 2023-02-04: 16000 [IU] via INTRAVENOUS

## 2023-02-04 MED ORDER — ONDANSETRON HCL 4 MG/2ML IJ SOLN
INTRAMUSCULAR | Status: DC | PRN
  Administered 2023-02-04: 4 mg via INTRAVENOUS

## 2023-02-04 MED ORDER — VANCOMYCIN HCL 1.5 G IV SOLR
1500.0000 mg | INTRAVENOUS | Status: AC
  Administered 2023-02-04: 1500 mg via INTRAVENOUS
  Filled 2023-02-04: qty 30

## 2023-02-04 MED ORDER — CHLORHEXIDINE GLUCONATE 0.12 % MT SOLN
OROMUCOSAL | Status: AC
  Filled 2023-02-04: qty 15

## 2023-02-04 MED ORDER — FENTANYL CITRATE (PF) 250 MCG/5ML IJ SOLN
INTRAMUSCULAR | Status: DC | PRN
  Administered 2023-02-04: 100 ug via INTRAVENOUS

## 2023-02-04 MED ORDER — ROCURONIUM BROMIDE 10 MG/ML (PF) SYRINGE
PREFILLED_SYRINGE | INTRAVENOUS | Status: DC | PRN
  Administered 2023-02-04: 50 mg via INTRAVENOUS

## 2023-02-04 MED ORDER — PHENYLEPHRINE 80 MCG/ML (10ML) SYRINGE FOR IV PUSH (FOR BLOOD PRESSURE SUPPORT)
PREFILLED_SYRINGE | INTRAVENOUS | Status: DC | PRN
  Administered 2023-02-04: 160 ug via INTRAVENOUS

## 2023-02-04 MED ORDER — IOHEXOL 350 MG/ML SOLN
INTRAVENOUS | Status: DC | PRN
  Administered 2023-02-04: 30 mL

## 2023-02-04 MED ORDER — PHENYLEPHRINE HCL-NACL 20-0.9 MG/250ML-% IV SOLN
INTRAVENOUS | Status: DC | PRN
  Administered 2023-02-04: 50 ug/min via INTRAVENOUS

## 2023-02-04 MED ORDER — SODIUM CHLORIDE 0.9 % IV SOLN: INTRAVENOUS | Status: DC

## 2023-02-04 MED ORDER — PROTAMINE SULFATE 10 MG/ML IV SOLN
INTRAVENOUS | Status: DC | PRN
  Administered 2023-02-04: 35 mg via INTRAVENOUS

## 2023-02-04 MED ORDER — PROPOFOL 500 MG/50ML IV EMUL
INTRAVENOUS | Status: DC | PRN
  Administered 2023-02-04: 50 ug/kg/min via INTRAVENOUS

## 2023-02-04 MED ORDER — SUGAMMADEX SODIUM 200 MG/2ML IV SOLN
INTRAVENOUS | Status: DC | PRN
  Administered 2023-02-04: 211.4 mg via INTRAVENOUS

## 2023-02-04 MED ORDER — SODIUM CHLORIDE 0.9% FLUSH: 3.0000 mL | Freq: Two times a day (BID) | INTRAVENOUS | Status: DC

## 2023-02-04 MED ORDER — SODIUM CHLORIDE 0.9% FLUSH: 3.0000 mL | INTRAVENOUS | Status: DC | PRN

## 2023-02-04 MED ORDER — LIDOCAINE 2% (20 MG/ML) 5 ML SYRINGE
INTRAMUSCULAR | Status: DC | PRN
  Administered 2023-02-04: 40 mg via INTRAVENOUS

## 2023-02-04 MED ORDER — ESMOLOL HCL 100 MG/10ML IV SOLN
INTRAVENOUS | Status: DC | PRN
  Administered 2023-02-04 (×2): 30 mg via INTRAVENOUS

## 2023-02-04 SURGICAL SUPPLY — 20 items
CATH INFINITI 5FR ANG PIGTAIL (CATHETERS) IMPLANT
CLOSURE PERCLOSE PROSTYLE (VASCULAR PRODUCTS) IMPLANT
DEVICE WATCHMAN FLX PRO PROC (KITS) IMPLANT
DEVICE WATCHMAN FXD CRV PROC (KITS) IMPLANT
ELECT DEFIB PAD ADLT CADENCE (PAD) IMPLANT
KIT HEART LEFT (KITS) ×1 IMPLANT
KIT SHEA VERSACROSS LAAC CONNE (KITS) IMPLANT
PACK CARDIAC CATHETERIZATION (CUSTOM PROCEDURE TRAY) ×1 IMPLANT
PAD DEFIB RADIO PHYSIO CONN (PAD) ×1 IMPLANT
SHEATH PERFORMER 16FR 30 (SHEATH) IMPLANT
SHEATH PINNACLE 8F 10CM (SHEATH) IMPLANT
SHEATH PROBE COVER 6X72 (BAG) ×1 IMPLANT
SHIELD RADPAD SCOOP 12X17 (MISCELLANEOUS) ×1 IMPLANT
SYS WATCHMAN FXD DBL (SHEATH) ×1 IMPLANT
SYSTEM WATCHMAN FXD DBL (SHEATH) IMPLANT
TRANSDUCER W/STOPCOCK (MISCELLANEOUS) ×1 IMPLANT
TUBING CIL FLEX 10 FLL-RA (TUBING) ×1 IMPLANT
WATCHMAN FLX PRO 27 (Prosthesis & Implant Heart) IMPLANT
WATCHMAN FLX PRO PROCEDURE (KITS) ×1 IMPLANT
WATCHMAN FXD CRV SYS PROCEDURE (KITS) ×1 IMPLANT

## 2023-02-04 NOTE — Discharge Instructions (Signed)

## 2023-02-04 NOTE — Anesthesia Postprocedure Evaluation (Signed)
Anesthesia Post Note  Patient: [name] [name]  Procedure(s) Performed: LEFT ATRIAL APPENDAGE OCCLUSION TRANSESOPHAGEAL ECHOCARDIOGRAM     Patient location during evaluation: PACU Anesthesia Type: General Level of consciousness: awake and alert Pain management: pain level controlled Vital Signs Assessment: post-procedure vital signs reviewed and stable Respiratory status: spontaneous breathing, nonlabored ventilation, respiratory function stable and patient connected to nasal cannula oxygen Cardiovascular status: blood pressure returned to baseline and stable Postop Assessment: no apparent nausea or vomiting Anesthetic complications: no  There were no known notable events for this encounter.  Last Vitals:  Vitals:   02/04/23 1115 02/04/23 1130  BP: 116/79 120/72  Pulse: (!) 105 (!) 115  Resp: 11 17  Temp:    SpO2: 98% 96%    Last Pain:  Vitals:   02/04/23 1130  TempSrc:   PainSc: 0-No pain                 Shelton Silvas

## 2023-02-04 NOTE — Discharge Summary (Signed)
[facility]    Patient ID: [name] [name],  MRN: [id number], DOB/AGE: 1945/01/16 78 y.o.  Admit date: 02/04/2023 Discharge date: 02/04/2023  Primary Care Physician: Laurann Montana, MD  Primary Cardiologist: Donato Schultz, MD  Electrophysiologist: Lanier Prude, MD  Primary Discharge Diagnosis:  Permanent Atrial Fibrillation Poor candidacy for long term anticoagulation due to  increased bleeding risks with hx of transfusions and elevated drug cost.  Procedures This Admission:  Transeptal Puncture Intra-procedural TEE which showed no LAA thrombus Left atrial appendage occlusive device placement on 02/04/23 by Dr. Lalla Brothers.   This study demonstrated:  1.Successful implantation of a WATCHMAN left atrial appendage occlusive device    2. TEE demonstrating no LAA thrombus, significant baseline tricuspid regurgitation 3. No early apparent complications.    Post Implant Anticoagulation Strategy: Continue Eliquis 5mg  by mouth twice daily for 45 days after implant. After 45 days, stop Eliquis and start Plavix 75mg  by mouth once daily to complete 6 months of post implant medical therapy. Plan for CT scan 60 days after implant to assess appendage patency and Watchman position.   Brief HPI: [name] [name] is a 78 y.o. female with a history of CKD Stage IIIa, HTN, HLD, MDS, OSA, large B-cell lymphoma treated with chemotherapy>in remission, and permanent atrial fibrillation who presented to [facility] 02/04/23 for elective LAAO with Watchman.   [name] [name] was initially referred to Dr. Lalla Brothers last year however her procedure was deferred after being diagnosed with large B-cell lymphoma. She is now in clinical remission and her blood counts have improved. She was seen 12/31 and felt to be a good candidate to proceed with implant. TEE planned prior to case start and previous CT used for anatomy guidance.   Hospital Course:  The patient was admitted and underwent left atrial appendage occlusive  device placement with 27mm Watchman FLX Pro device. She was monitored in the post procedure setting and has done very well with no concerns. Given this, she is being considered for same day discharge later today. Groin site has been stable without evidence of hematoma or bleeding. Wound care and restrictions were reviewed with the patient. The patient has been scheduled for post procedure follow up with a provider in approximately 1 month. She will restart Eliquis this evening and continue for 45 days (03/21/23) then stop. At that time she will transition to Plavix 75mg  daily to complete 6 months (08/04/23) of therapy. She will require dental SBE during this time and should refrain from dental work or cleanings for at least 30 days post implant. SBE to be RXd at follow up. A repeat CT will be performed in approximately 60 days to ensure proper seal of the device.   Physical Exam: Vitals:   02/04/23 1105 02/04/23 1110 02/04/23 1115 02/04/23 1130  BP: 97/74 108/70 116/79 120/72  Pulse: (!) 102 86 (!) 105 (!) 115  Resp: 11 12 11 17   Temp:      TempSrc:      SpO2: 94% 98% 98% 96%  Weight:      Height:       Labs:   Lab Results  Component Value Date   WBC 5.4 01/11/2023   HGB 14.3 01/11/2023   HCT 42.2 01/11/2023   MCV 98.8 01/11/2023   PLT 163 01/11/2023   No results for input(s): "NA", "K", "CL", "CO2", "BUN", "CREATININE", "CALCIUM", "PROT", "BILITOT", "ALKPHOS", "ALT", "AST", "GLUCOSE" in the last 168 hours.  Invalid input(s): "LABALBU"   Discharge Medications:  Allergies as of 02/04/2023       Reactions   Omnicef [cefdinir] Swelling   Tongue swelling   Rituxan [rituximab] Shortness Of Breath   Patient given Pepcid 20mg  IV with fluids patinet reported feeling better. Treatment was restarted patient was able to tolerate the rest of the treatment with no issues.    Bacitracin    Rash   Bacitracin-polymyxin B    Rash   Duloxetine Other (See Comments)   felt funny- thinking messed up    Duloxetine Hcl    felt funny- thinking messed up   Lexapro [escitalopram]    Makes her feel funny and sleepy   Mirtazapine    weight gain in high doses    Penicillins Swelling   Tongue swell   Peroxide [hydrogen Peroxide] Other (See Comments)   Redness.    Zoloft [sertraline Hcl]    Makes her feel funny   Neosporin [neomycin-bacitracin Zn-polymyx] Rash   Blisters, itching.         Medication List     TAKE these medications    acetaminophen 500 MG tablet Commonly known as: TYLENOL Take 1,000 mg by mouth every 6 (six) hours as needed for moderate pain.   AeroChamber MV inhaler Use as instructed   albuterol 108 (90 Base) MCG/ACT inhaler Commonly known as: Ventolin HFA Inhale 1-2 puffs into the lungs every 4 (four) hours as needed for wheezing or shortness of breath.   ALPRAZolam 0.5 MG tablet Commonly known as: XANAX Take 1 tablet (0.5 mg total) by mouth 3 (three) times daily as needed for anxiety.   benzonatate 200 MG capsule Commonly known as: TESSALON Take 1 capsule by mouth three times daily as needed for cough   buPROPion 150 MG 24 hr tablet Commonly known as: WELLBUTRIN XL Take 150 mg by mouth daily.   Calcium 250 MG Caps Take 250 mg by mouth daily.   dextromethorphan-guaiFENesin 30-600 MG 12hr tablet Commonly known as: MUCINEX DM Take 1 tablet by mouth daily as needed for cough.   diltiazem 240 MG 24 hr capsule Commonly known as: CARDIZEM CD Take 1 capsule (240 mg total) by mouth daily.   diltiazem 30 MG tablet Commonly known as: CARDIZEM Take 1 tablet by mouth once if heart rate greater than 100bpm   Eliquis 5 MG Tabs tablet Generic drug: apixaban Take 1 tablet by mouth twice daily Notes to patient: RESTART THIS EVENING, 02/04/23   furosemide 20 MG tablet Commonly known as: LASIX Take 1 tablet (20 mg total) by mouth daily as needed for edema or fluid.   levothyroxine 75 MCG tablet Commonly known as: SYNTHROID TAKE 1 TABLET DAILY BEFORE  BREAKFAST (NEED APPOINTMENT FOR FURTHER REFILLS)   lidocaine-prilocaine cream Commonly known as: EMLA Apply to Port-A-Cath site 1 to 2 hours prior to use   Magnesium 250 MG Tabs Take 250 mg by mouth at bedtime.   polyethylene glycol 17 g packet Commonly known as: MiraLax Take 17 g by mouth daily.   potassium chloride SA 20 MEQ tablet Commonly known as: KLOR-CON M Take 1 tablet (20 mEq total) by mouth daily as needed (Take potassium only when you take the Furosemide (Lasix)).   pravastatin 40 MG tablet Commonly known as: PRAVACHOL Take 40 mg by mouth at bedtime.       Disposition:  Home  Discharge Instructions     Call MD for:  difficulty breathing, headache or visual disturbances   Complete by: As directed    Call MD for:  extreme fatigue   Complete by: As directed    Call MD for:  hives   Complete by: As directed    Call MD for:  persistant dizziness or light-headedness   Complete by: As directed    Call MD for:  persistant nausea and vomiting   Complete by: As directed    Call MD for:  redness, tenderness, or signs of infection (pain, swelling, redness, odor or green/yellow discharge around incision site)   Complete by: As directed    Call MD for:  severe uncontrolled pain   Complete by: As directed    Call MD for:  temperature >100.4   Complete by: As directed    Diet - low sodium heart healthy   Complete by: As directed    Discharge instructions   Complete by: As directed    [facility] Procedure, Care After  Procedure MD: Dr. Isidoro Donning Clinical Coordinator: Karsten Fells, RN  This sheet gives you information about how to care for yourself after your procedure. Your health care provider may also give you more specific instructions. If you have problems or questions, contact your health care provider.  What can I expect after the procedure? After the procedure, it is common to have: Bruising around your puncture site. Tenderness around your puncture  site. Tiredness (fatigue).  Medication instructions It is very important to continue to take your blood thinner as directed by your doctor after the Watchman procedure. Call your procedure doctor's office with question or concerns. If you are on Coumadin (warfarin), you will have your INR checked the week after your procedure, with a goal INR of 2.0 - 3.0. Please follow your medication instructions on your discharge summary. Only take the medications listed on your discharge paperwork.  Follow up You will be seen in 1 month after your procedure You will have a repeat CT scan approximately 8 weeks after your procedure mark to check your device You will follow up the MD/APP who performed your procedure 6 months after your procedure The Watchman Clinical Coordinator will check in with you from time to time, including 1 and 2 years after your procedure.    Follow these instructions at home: Puncture site care  Follow instructions from your health care provider about how to take care of your puncture site. Make sure you: If present, leave stitches (sutures), skin glue, or adhesive strips in place.  If a large square bandage is present, this may be removed 24 hours after surgery.  Check your puncture site every day for signs of infection. Check for: Redness, swelling, or pain. Fluid or blood. If your puncture site starts to bleed, lie down on your back, apply firm pressure to the area, and contact your health care provider. Warmth. Pus or a bad smell. Driving Do not drive yourself home if you received sedation Do not drive for at least 4 days after your procedure or however long your health care provider recommends. (Do not resume driving if you have previously been instructed not to drive for other health reasons.) Do not spend greater than 1 hour at a time in a car for the first 3 days. Stop and take a break with a 5 minute walk at least every hour.  Do not drive or use heavy machinery while  taking prescription pain medicine.  Activity Avoid activities that take a lot of effort, including exercise, for at least 7 days after your procedure. For the first 3 days, avoid sitting for longer than one  hour at a time.  Avoid alcoholic beverages, signing paperwork, or participating in legal proceedings for 24 hours after receiving sedation Do not lift anything that is heavier than 10 lb (4.5 kg) for one week.  No sexual activity for 1 week.  Return to your normal activities as told by your health care provider. Ask your health care provider what activities are safe for you. General instructions Take over-the-counter and prescription medicines only as told by your health care provider. Do not use any products that contain nicotine or tobacco, such as cigarettes and e-cigarettes. If you need help quitting, ask your health care provider. You may shower after 24 hours, but Do not take baths, swim, or use a hot tub for 1 week.  Do not drink alcohol for 24 hours after your procedure. Keep all follow-up visits as told by your health care provider. This is important. Dental Work: You will require antibiotics prior to any dental work, including cleanings, for 6 months after your Watchman implantation to help protect you from infection. After 6 months, antibiotics are no longer required. Contact a health care provider if: You have redness, mild swelling, or pain around your puncture site. You have soreness in your throat or at your puncture site that does not improve after several days You have fluid or blood coming from your puncture site that stops after applying firm pressure to the area. Your puncture site feels warm to the touch. You have pus or a bad smell coming from your puncture site. You have a fever. You have chest pain or discomfort that spreads to your neck, jaw, or arm. You are sweating a lot. You feel nauseous. You have a fast or irregular heartbeat. You have shortness of  breath. You are dizzy or light-headed and feel the need to lie down. You have pain or numbness in the arm or leg closest to your puncture site. Get help right away if: Your puncture site suddenly swells. Your puncture site is bleeding and the bleeding does not stop after applying firm pressure to the area. These symptoms may represent a serious problem that is an emergency. Do not wait to see if the symptoms will go away. Get medical help right away. Call your local emergency services (911 in the [place] Do not drive yourself to the hospital. Summary After the procedure, it is normal to have bruising and tenderness at the puncture site in your groin, neck, or forearm. Check your puncture site every day for signs of infection. Get help right away if your puncture site is bleeding and the bleeding does not stop after applying firm pressure to the area. This is a medical emergency.  This information is not intended to replace advice given to you by your health care provider. Make sure you discuss any questions you have with your health care provider.   Increase activity slowly   Complete by: As directed        Follow-up Information     Janetta Hora, PA-C Follow up on 03/10/2023.   Specialties: Cardiology, Radiology Why: Your appointment will be at 1:30PM. Please arrive by 1:15PM. Contact information: [address] [place] [address] [phone]                 Duration of Discharge Encounter:  APP Time: 45 minutes   Signed, Georgie Chard, NP  02/04/2023 2:00 PM

## 2023-02-04 NOTE — Transfer of Care (Signed)
Immediate Anesthesia Transfer of Care Note  Patient: [name] [name]  Procedure(s) Performed: LEFT ATRIAL APPENDAGE OCCLUSION TRANSESOPHAGEAL ECHOCARDIOGRAM  Patient Location: Cath Lab  Anesthesia Type:General  Level of Consciousness: awake, alert , and oriented  Airway & Oxygen Therapy: Patient Spontanous Breathing  Post-op Assessment: Report given to RN and Post -op Vital signs reviewed and stable  Post vital signs: Reviewed and stable  Last Vitals:  Vitals Value Taken Time  BP 81/72 02/04/23 1030  Temp 37.2 C 02/04/23 1018  Pulse 98 02/04/23 1032  Resp 21 02/04/23 1032  SpO2 97 % 02/04/23 1032  Vitals shown include unfiled device data.  Last Pain:  Vitals:   02/04/23 1018  TempSrc: Temporal  PainSc: Asleep         Complications: There were no known notable events for this encounter.

## 2023-02-04 NOTE — Progress Notes (Signed)
Patient has a continuous cough which she states is "normal for me when I get up".  I have reminded her several times concerning apllying pressure at her groin site when coughing and not to raise her head off the pillow.

## 2023-02-04 NOTE — Progress Notes (Signed)
All groin checks have been performed per protocol.  Right femoral site is a Level 0.  Patient did utilize the restroom post bedrest.  Site has no drainage.  Patient sitting up at bedside.

## 2023-02-04 NOTE — Anesthesia Procedure Notes (Signed)
Arterial Line Insertion Start/End1/23/2025 8:10 AM Performed by: Garfield Cornea, CRNA, CRNA  Patient location: Pre-op. Preanesthetic checklist: patient identified, IV checked, site marked, risks and benefits discussed, surgical consent, monitors and equipment checked, pre-op evaluation, timeout performed and anesthesia consent Lidocaine 1% used for infiltration Left, radial was placed Catheter size: 20 G Hand hygiene performed  and maximum sterile barriers used   Attempts: 1 Procedure performed without using ultrasound guided technique. Following insertion, dressing applied and Biopatch. Post procedure assessment: normal and unchanged  Patient tolerated the procedure well with no immediate complications.

## 2023-02-04 NOTE — Interval H&P Note (Signed)
History and Physical Interval Note:  02/04/2023 7:25 AM  [name] [name]  has presented today for surgery, with the diagnosis of afib.  The various methods of treatment have been discussed with the patient and family. After consideration of risks, benefits and other options for treatment, the patient has consented to  Procedure(s): LEFT ATRIAL APPENDAGE OCCLUSION (N/A) TRANSESOPHAGEAL ECHOCARDIOGRAM (N/A) as a surgical intervention.  The patient's history has been reviewed, patient examined, no change in status, stable for surgery.  I have reviewed the patient's chart and labs.  Questions were answered to the patient's satisfaction.     Martika Egler T Jordynn Marcella

## 2023-02-04 NOTE — Anesthesia Procedure Notes (Signed)
Procedure Name: Intubation Date/Time: 02/04/2023 9:15 AM  Performed by: Darryl Nestle, CRNAPre-anesthesia Checklist: Patient identified, Emergency Drugs available, Suction available and Patient being monitored Patient Re-evaluated:Patient Re-evaluated prior to induction Oxygen Delivery Method: Circle system utilized Preoxygenation: Pre-oxygenation with 100% oxygen Induction Type: IV induction Ventilation: Mask ventilation without difficulty Laryngoscope Size: Mac and 3 Grade View: Grade I Tube type: Oral Tube size: 7.0 mm Number of attempts: 1 Airway Equipment and Method: Stylet and Oral airway Placement Confirmation: ETT inserted through vocal cords under direct vision, positive ETCO2 and breath sounds checked- equal and bilateral Secured at: 21 cm Tube secured with: Tape Dental Injury: Teeth and Oropharynx as per pre-operative assessment

## 2023-02-04 NOTE — Anesthesia Preprocedure Evaluation (Addendum)
Anesthesia Evaluation  Patient identified by MRN, date of birth, ID band Patient awake    Reviewed: Allergy & Precautions, NPO status , Patient's Chart, lab work & pertinent test results  Airway Mallampati: I  TM Distance: >3 FB Neck ROM: Full    Dental  (+) Teeth Intact, Dental Advisory Given   Pulmonary sleep apnea    breath sounds clear to auscultation       Cardiovascular hypertension, Pt. on medications + dysrhythmias Atrial Fibrillation + Valvular Problems/Murmurs MR  Rhythm:Regular Rate:Normal  Echo:   1. Left ventricular ejection fraction, by estimation, is 60 to 65%. The  left ventricle has normal function. The left ventricle has no regional  wall motion abnormalities. Left ventricular diastolic parameters were  normal.   2. Right ventricular systolic function is normal. The right ventricular  size is normal. There is moderately elevated pulmonary artery systolic  pressure.   3. Left atrial size was moderately dilated.   4. The mitral valve is degenerative. Mild mitral valve regurgitation. No  evidence of mitral stenosis.   5. The aortic valve is tricuspid. Aortic valve regurgitation is not  visualized. No aortic stenosis is present.   6. The inferior vena cava is dilated in size with >50% respiratory  variability, suggesting right atrial pressure of 8 mmHg.     Neuro/Psych  PSYCHIATRIC DISORDERS Anxiety Depression     Neuromuscular disease    GI/Hepatic Neg liver ROS,GERD  ,,  Endo/Other  Hypothyroidism    Renal/GU Renal disease     Musculoskeletal  (+) Arthritis ,    Abdominal   Peds  Hematology  (+) Blood dyscrasia, anemia   Anesthesia Other Findings   Reproductive/Obstetrics                             Anesthesia Physical Anesthesia Plan  ASA: 4  Anesthesia Plan: General   Post-op Pain Management: Tylenol PO (pre-op)*   Induction: Intravenous  PONV Risk Score  and Plan: 4 or greater and Ondansetron and Treatment may vary due to age or medical condition  Airway Management Planned: Oral ETT  Additional Equipment: Arterial line  Intra-op Plan:   Post-operative Plan: Extubation in OR  Informed Consent: I have reviewed the patients History and Physical, chart, labs and discussed the procedure including the risks, benefits and alternatives for the proposed anesthesia with the patient or authorized representative who has indicated his/her understanding and acceptance.     Dental advisory given  Plan Discussed with: CRNA  Anesthesia Plan Comments:        Anesthesia Quick Evaluation

## 2023-02-04 NOTE — Progress Notes (Signed)
All groin checks performed per protocol.  Right femoral site remains a Level 0.  Patient continues to cough without holding groin.  The plan is for the patient to go home later today.  Awaiting bed assignment.

## 2023-02-05 ENCOUNTER — Encounter (HOSPITAL_COMMUNITY): Payer: Self-pay | Admitting: Cardiology

## 2023-02-05 ENCOUNTER — Telehealth: Payer: Self-pay

## 2023-02-05 NOTE — Telephone Encounter (Signed)
  [facility]   Watchman Team  Contacted the patient regarding discharge from [facility] on 02/04/2023  The patient understands to follow up with Carlean Jews on 03/10/2023  The patient understands discharge instructions? Yes  The patient understands medications and regimen? Yes   The patient reports groin site looks healthy with no S/S of bleeding or infection  The patient understands to call with any questions or concerns prior to scheduled visit.

## 2023-02-08 MED FILL — Fentanyl Citrate Preservative Free (PF) Inj 100 MCG/2ML: INTRAMUSCULAR | Qty: 2 | Status: AC

## 2023-03-08 ENCOUNTER — Inpatient Hospital Stay: Payer: HMO

## 2023-03-08 NOTE — Progress Notes (Deleted)
 [facility]   MULTIDISCIPLINARY HEART CLINIC                                     Cardiology Office Note:    Date:  03/08/2023   ID:  [name], DOB Oct 25, 1945, MRN [id number]  PCP:  Laurann Montana, MD  [facility] Cardiologist:  Donato Schultz, MD  [facility] Electrophysiologist:  Lanier Prude, MD   Referring MD: Laurann Montana, MD   1 month s/p LAAO with Watchman  History of Present Illness:    [name] [name] is a 78 y.o. female with a hx of CKD Stage IIIa, HTN, HLD, MDS, OSA, large B-cell lymphoma treated with chemotherapy in remission, and permanent atrial fibrillation who presents to clinic for follow up.   [name] [name] was initially referred to Dr. Lalla Brothers last year however her procedure was deferred after being diagnosed with large B-cell lymphoma. She is now in clinical remission and her blood counts have improved. She was seen 12/31 and felt to be a good candidate to proceed with implant.    Today the patient presents to clinic for follow up.   Past Medical History:  Diagnosis Date   (HFpEF) heart failure with preserved ejection fraction (HCC) 07/06/2016   A-fib (HCC)    paroxysmal   Acquired thrombophilia (HCC) 09/03/2021   Acute bronchitis 06/21/2020   Anemia 08/02/2020   Hgb 13.7-> 10.7 between 10/2019 and 07/2020   Anxiety    Arthritis    Benign neoplasm of colon 12/29/2011   Bilateral carpal tunnel syndrome 11/06/2015   Cataracts, bilateral    immature   Cervical spondylosis without myelopathy 11/06/2015   Chest pain 07/06/2016   Chronic cough    Chronic kidney disease, stage 3 unspecified (HCC) 03/26/2021   Chronic rhinitis 06/16/2017   CKD (chronic kidney disease) stage 3, GFR 30-59 ml/min (HCC)    Depression    Disequilibrium 08/01/2019   Drug-induced hypothyroidism 03/26/2021   Dyspnea on exertion 04/03/2016   PFT 08/02/20-mild obstruction, no resp to BD, mild reduction DLCO   Essential hypertension    Fatigue 07/06/2016    Gait instability 08/01/2019   Gastroesophageal reflux disease without esophagitis    Generalized anxiety disorder 11/25/2016   Generalized weakness 08/28/2021   Hardening of the aorta (main artery of the heart) (HCC) 03/26/2021   Hemorrhoid    History of blood transfusion 08/30/2021   while undergoing treatment for her lymphoma   History of bronchitis    couple of yrs ago   History of peristent atrial fibrillation    Hoarseness 05/12/2017   Hx of colonic polyps 05/24/2014   Hyperlipidemia    takes Pravastatin daily   Hypothyroidism 04/04/2018   Long term current use of amiodarone 10/19/2017   Loss of appetite 09/03/2021   Mitral regurgitation 02/27/2016   Moderate recurrent major depression (HCC) 09/03/2021   NHL (non-Hodgkin's lymphoma) (HCC) 09/03/2021   OSA (obstructive sleep apnea) 10/19/2017   HST 09/24/2018- AHI 30.5/ hr, desaturation to 76%, body weight 249.6 lbs Unable to tolerate CPAP, failed twice.   Other specified disorders of bone density and structure, other site 09/03/2021   PAF (paroxysmal atrial fibrillation) (HCC) 02/17/2016   Pancytopenia (HCC) 08/29/2021   Persistent atrial fibrillation (HCC) 10/31/2016   Prediabetes 09/03/2021   Presence of Watchman left atrial appendage closure device 02/04/2023   27mm Watchman FLX Pro device placed by Dr. Lalla Brothers  Primary localized osteoarthritis of left knee 09/21/2016   Primary osteoarthritis of both first carpometacarpal joints 11/06/2015   Pruritus 10/31/2016   Rectocele 09/03/2021   Rhinitis, allergic 03/19/2017   S/P lumbar laminectomy 04/05/2013   Secondary hypercoagulable state (HCC) 04/28/2021   Sensorineural hearing loss (SNHL) of both ears 08/01/2019   Severe recurrent major depression without psychotic features (HCC) 11/25/2016   Splenomegaly 08/20/2021   Syncope 09/03/2021   Trigger finger 12/31/2022   middle finger on left and right hand   Upper airway cough syndrome 03/19/2017   Urinary urgency      Past Surgical History:  Procedure Laterality Date   ABDOMINAL HYSTERECTOMY  1980   partial   BACK SURGERY     CARDIOVERSION N/A 02/24/2016   Procedure: CARDIOVERSION;  Surgeon: Pricilla Riffle, MD;  Location: [facility] ENDOSCOPY;  Service: Cardiovascular;  Laterality: N/A;   CARDIOVERSION N/A 03/19/2016   Procedure: CARDIOVERSION;  Surgeon: Pricilla Riffle, MD;  Location: [facility] ENDOSCOPY;  Service: Cardiovascular;  Laterality: N/A;   CARDIOVERSION N/A 05/15/2021   Procedure: CARDIOVERSION;  Surgeon: Chrystie Nose, MD;  Location: [facility] ENDOSCOPY;  Service: Cardiovascular;  Laterality: N/A;   CARDIOVERSION N/A 02/06/2022   Procedure: CARDIOVERSION;  Surgeon: Maisie Fus, MD;  Location: [facility] ENDOSCOPY;  Service: Cardiovascular;  Laterality: N/A;   CARPAL TUNNEL RELEASE     CHOLECYSTECTOMY  1975   COLONOSCOPY WITH PROPOFOL  12/29/2011   Procedure: COLONOSCOPY WITH PROPOFOL;  Surgeon: Shirley Friar, MD;  Location: [facility] ENDOSCOPY;  Service: Endoscopy;  Laterality: N/A;   COLONOSCOPY WITH PROPOFOL N/A 05/24/2014   Procedure: COLONOSCOPY WITH PROPOFOL;  Surgeon: Charlott Rakes, MD;  Location: [facility] ENDOSCOPY;  Service: Endoscopy;  Laterality: N/A;   ESOPHAGEAL MANOMETRY N/A 02/22/2017   Procedure: ESOPHAGEAL MANOMETRY (EM);  Surgeon: Charlott Rakes, MD;  Location: [facility] ENDOSCOPY;  Service: Endoscopy;  Laterality: N/A;   ESOPHAGOGASTRODUODENOSCOPY     FOOT SURGERY     left, right   FRACTURE SURGERY     left leg-knee   HEEL SPUR EXCISION Bilateral    HOT HEMOSTASIS  12/29/2011   Procedure: HOT HEMOSTASIS (ARGON PLASMA COAGULATION/BICAP);  Surgeon: Shirley Friar, MD;  Location: [facility] ENDOSCOPY;  Service: Endoscopy;  Laterality: N/A;   HOT HEMOSTASIS N/A 05/24/2014   Procedure: HOT HEMOSTASIS (ARGON PLASMA COAGULATION/BICAP);  Surgeon: Charlott Rakes, MD;  Location: [facility] ENDOSCOPY;  Service: Endoscopy;  Laterality: N/A;   IR BONE MARROW BIOPSY & ASPIRATION  08/13/2021   IR IMAGING GUIDED PORT  INSERTION  09/09/2021   LEFT ATRIAL APPENDAGE OCCLUSION N/A 02/04/2023   Procedure: LEFT ATRIAL APPENDAGE OCCLUSION;  Surgeon: Lanier Prude, MD;  Location: [facility] INVASIVE CV LAB;  Service: Cardiovascular;  Laterality: N/A;   leg surgery d/t break Left    LUMBAR LAMINECTOMY/DECOMPRESSION MICRODISCECTOMY Left 04/05/2013   Procedure: LUMBAR FOUR TO FIVE LUMBAR LAMINECTOMY/DECOMPRESSION MICRODISCECTOMY 1 LEVEL;  Surgeon: Tia Alert, MD;  Location: [facility] NEURO ORS;  Service: Neurosurgery;  Laterality: Left;   LYMPH NODE BIOPSY Left 08/30/2021   Procedure: LYMPH NODE BIOPSY;  Surgeon: Griselda Miner, MD;  Location: [facility] ORS;  Service: General;  Laterality: Left;   NISSEN FUNDOPLICATION     PH IMPEDANCE STUDY N/A 02/22/2017   Procedure: PH IMPEDANCE STUDY;  Surgeon: Charlott Rakes, MD;  Location: [facility] ENDOSCOPY;  Service: Endoscopy;  Laterality: N/A;   TEE WITHOUT CARDIOVERSION N/A 02/24/2016   Procedure: TRANSESOPHAGEAL ECHOCARDIOGRAM (TEE);  Surgeon: Pricilla Riffle, MD;  Location: [facility] ENDOSCOPY;  Service: Cardiovascular;  Laterality: N/A;  TOTAL KNEE ARTHROPLASTY Left 10/07/2016   Procedure: TOTAL KNEE ARTHROPLASTY;  Surgeon: Loreta Ave, MD;  Location: [facility] OR;  Service: Orthopedics;  Laterality: Left;   TRANSESOPHAGEAL ECHOCARDIOGRAM (CATH LAB) N/A 02/04/2023   Procedure: TRANSESOPHAGEAL ECHOCARDIOGRAM;  Surgeon: Lanier Prude, MD;  Location: [facility] INVASIVE CV LAB;  Service: Cardiovascular;  Laterality: N/A;    Current Medications: No outpatient medications have been marked as taking for the 03/10/23 encounter (Appointment) with [facility].     Allergies:   Omnicef [cefdinir], Rituxan [rituximab], Bacitracin, Bacitracin-polymyxin b, Duloxetine, Duloxetine hcl, Lexapro [escitalopram], Mirtazapine, Penicillins, Peroxide [hydrogen peroxide], Zoloft [sertraline hcl], and Neosporin [neomycin-bacitracin zn-polymyx]   Social History   Socioeconomic History   Marital status:  Married    Spouse name: Jillyn Hidden   Number of children: 2   Years of education: 12   Highest education level: Some college, no degree  Occupational History   Occupation: retired  Tobacco Use   Smoking status: Never   Smokeless tobacco: Never   Tobacco comments:    Never smoke 05/22/21  Vaping Use   Vaping status: Never Used  Substance and Sexual Activity   Alcohol use: No   Drug use: No   Sexual activity: Yes    Birth control/protection: Surgical    Comment: Hysterectomy  Other Topics Concern   Not on file  Social History Narrative   Right handed   Drinks caffeine   Two story home   Social Drivers of Health   Financial Resource Strain: Low Risk  (11/25/2016)   Overall Financial Resource Strain (CARDIA)    Difficulty of Paying Living Expenses: Not very hard  Food Insecurity: No Food Insecurity (02/04/2023)   Hunger Vital Sign    Worried About Running Out of Food in the Last Year: Never true    Ran Out of Food in the Last Year: Never true  Transportation Needs: No Transportation Needs (02/04/2023)   PRAPARE - Administrator, Civil Service (Medical): No    Lack of Transportation (Non-Medical): No  Physical Activity: Unknown (11/25/2016)   Exercise Vital Sign    Days of Exercise per Week: Patient declined    Minutes of Exercise per Session: Patient declined  Stress: Stress Concern Present (11/25/2016)   [employer] of Occupational Health - Occupational Stress Questionnaire    Feeling of Stress : Very much  Social Connections: Patient Declined (02/04/2023)   Social Connection and Isolation Panel [NHANES]    Frequency of Communication with Friends and Family: Patient declined    Frequency of Social Gatherings with Friends and Family: Patient declined    Attends Religious Services: Patient declined    Database administrator or Organizations: Patient declined    Attends Banker Meetings: Patient declined    Marital Status: Patient declined      Family History: The patient's family history includes Colon cancer in her mother; Heart disease in her father. There is no history of Breast cancer or Thyroid disease.  ROS:   Please see the history of present illness.    All other systems reviewed and are negative.  EKGs/Labs/Other Studies Reviewed:    The following studies were reviewed today:  Cardiac CT 05/27/21 IMPRESSION: 1. There is normal pulmonary vein drainage into the left atrium. Moderate left atrial dilation. Severe right atrial dilation.   2. The left atrial appendage is large - mixed chicken wing / broccoli type with two lobes. There is a perfusion defect that resolved in the delay  imaging. This is not suggestive of thrombus in the left atrial appendage. Measurements for LAA-O device as above.   3. The esophagus runs in the left atrial midline but is in closer proximity to the right lower pulmonary vein ostium.   4. Coronary calcium score of 305. This was 79th percentile for age, sex, and race matched control.   5. Dilation of the main pulmonary artery 30 mm with trunk to aorta ratio of ? 0.9. This can be associated with the presence of pulmonary hypertension; clinical correlation advised.   6. Moderate mitral annular calcification.   7. IVC dilation: 31 mm   RECOMMENDATIONS:   Coronary artery calcium (CAC) score is a strong predictor of incident coronary heart disease (CHD) and provides predictive information beyond traditional risk factors. CAC scoring is reasonable to use in the decision to withhold, postpone, or initiate statin therapy in intermediate-risk or selected borderline-risk asymptomatic adults (age 39-75 years and LDL-C >=70 to <190 mg/dL) who do not have diabetes or established atherosclerotic cardiovascular disease (ASCVD).* In intermediate-risk (10-year ASCVD risk >=7.5% to <20%) adults or selected borderline-risk (10-year ASCVD risk >=5% to <7.5%) adults in whom a CAC score is measured  for the purpose of making a treatment decision the following recommendations have been made:   If CAC = 0, it is reasonable to withhold statin therapy and reassess in 5 to 10 years, as long as higher risk conditions are absent (diabetes mellitus, family history of premature CHD in first degree relatives (males <55 years; females <65 years), cigarette smoking, LDL >=190 mg/dL or other independent risk factors).   If CAC is 1 to 99, it is reasonable to initiate statin therapy for patients >=21 years of age.   If CAC is >=100 or >=75th percentile, it is reasonable to initiate statin therapy at any age.   Cardiology referral should be considered for patients with CAC scores =400 or >=75th percentile.   *2018 AHA/ACC/AACVPR/AAPA/ABC/ACPM/ADA/AGS/APhA/ASPC/NLA/PCNA Guideline on the Management of Blood Cholesterol: A Report of the American College of Cardiology/American Heart Association Task Force on Clinical Practice Guidelines. J Am Coll Cardiol. 2019;73(24):3168-3209.   Riley Lam, MD     Electronically Signed   By: Riley Lam M.D.   On: 05/27/2021 12:45  EKG:  EKG is  ordered today.  The ekg ordered today demonstrates sinus with low voltage QRS, HR 76 bpm  Recent Labs: 01/11/2023: ALT 13; BUN 23; Creatinine 1.25; Hemoglobin 14.3; Platelet Count 163; Potassium 4.3; Sodium 138  Recent Lipid Panel    Component Value Date/Time   CHOL 125 02/18/2016 0454   TRIG 90 02/18/2016 0454   HDL 41 02/18/2016 0454   CHOLHDL 3.0 02/18/2016 0454   VLDL 18 02/18/2016 0454   LDLCALC 66 02/18/2016 0454     Risk Assessment/Calculations:    CHA2DS2-VASc Score = 5   This indicates a 7.2% annual risk of stroke. The patient's score is based upon: CHF History: 1 HTN History: 1 Diabetes History: 0 Stroke History: 0 Vascular Disease History: 0 Age Score: 2 Gender Score: 1   {This patient has a significant risk of stroke if diagnosed with atrial fibrillation.   Please consider VKA or DOAC agent for anticoagulation if the bleeding risk is acceptable.   You can also use the SmartPhrase .[email or web address] for documentation.   :[id number]}   Physical Exam:    VS:  There were no vitals taken for this visit.    Wt Readings from Last 3 Encounters:  02/04/23 233 lb (105.7 kg)  01/12/23 233 lb 12.8 oz (106.1 kg)  01/11/23 233 lb 1.6 oz (105.7 kg)     GEN:  Well nourished, well developed in no acute distress HEENT: Normal NECK: No JVD LYMPHATICS: No lymphadenopathy CARDIAC: RRR, no murmurs, rubs, gallops RESPIRATORY:  Clear to auscultation without rales, wheezing or rhonchi  ABDOMEN: Soft, non-tender, non-distended MUSCULOSKELETAL:  No edema; No deformity  SKIN: Warm and dry. Diffuse rash all over body, see photos.  NEUROLOGIC:  Alert and oriented x 3 PSYCHIATRIC:  Normal affect      ASSESSMENT:    No diagnosis found.  PLAN:    In order of problems listed above:  Persistent atrial fibrillation: maintaining sinus since DCCV and on amiodarone. Anticoagulated with ELiquis. Not felt to be  good long term candidate for [facility] given MDS and anemia.   Elevated calcium score: noted on pre LAAO CT. Continue pravastatin .      Medication Adjustments/Labs and Tests Ordered: Current medicines are reviewed at length with the patient today.  Concerns regarding medicines are outlined above.  No orders of the defined types were placed in this encounter.  No orders of the defined types were placed in this encounter.   There are no Patient Instructions on file for this visit.   Signed, Cline Crock, PA-C  03/08/2023 4:42 PM    [facility]

## 2023-03-09 ENCOUNTER — Encounter (HOSPITAL_COMMUNITY): Payer: Self-pay | Admitting: Physician Assistant

## 2023-03-09 ENCOUNTER — Inpatient Hospital Stay: Payer: HMO | Attending: Oncology

## 2023-03-09 ENCOUNTER — Ambulatory Visit (HOSPITAL_COMMUNITY)
Admission: RE | Admit: 2023-03-09 | Discharge: 2023-03-09 | Disposition: A | Discharge status: Home / Self Care | Payer: HMO | Source: Ambulatory Visit | Attending: Physician Assistant | Admitting: Physician Assistant

## 2023-03-09 VITALS — BP 142/88 | HR 118 | Ht 67.0 in | Wt 235.2 lb

## 2023-03-09 VITALS — BP 141/88 | HR 98 | Temp 97.8°F | Resp 18

## 2023-03-09 DIAGNOSIS — I5032 Chronic diastolic (congestive) heart failure: Secondary | ICD-10-CM | POA: Insufficient documentation

## 2023-03-09 DIAGNOSIS — G4733 Obstructive sleep apnea (adult) (pediatric): Secondary | ICD-10-CM | POA: Insufficient documentation

## 2023-03-09 DIAGNOSIS — I4892 Unspecified atrial flutter: Secondary | ICD-10-CM | POA: Insufficient documentation

## 2023-03-09 DIAGNOSIS — C8338 Diffuse large B-cell lymphoma, lymph nodes of multiple sites: Secondary | ICD-10-CM | POA: Insufficient documentation

## 2023-03-09 DIAGNOSIS — D469 Myelodysplastic syndrome, unspecified: Secondary | ICD-10-CM | POA: Insufficient documentation

## 2023-03-09 DIAGNOSIS — Z7901 Long term (current) use of anticoagulants: Secondary | ICD-10-CM | POA: Insufficient documentation

## 2023-03-09 DIAGNOSIS — I130 Hypertensive heart and chronic kidney disease with heart failure and stage 1 through stage 4 chronic kidney disease, or unspecified chronic kidney disease: Secondary | ICD-10-CM | POA: Diagnosis not present

## 2023-03-09 DIAGNOSIS — I4821 Permanent atrial fibrillation: Secondary | ICD-10-CM | POA: Diagnosis not present

## 2023-03-09 DIAGNOSIS — I129 Hypertensive chronic kidney disease with stage 1 through stage 4 chronic kidney disease, or unspecified chronic kidney disease: Secondary | ICD-10-CM | POA: Diagnosis present

## 2023-03-09 DIAGNOSIS — D6869 Other thrombophilia: Secondary | ICD-10-CM | POA: Diagnosis not present

## 2023-03-09 DIAGNOSIS — Z95828 Presence of other vascular implants and grafts: Secondary | ICD-10-CM

## 2023-03-09 DIAGNOSIS — N189 Chronic kidney disease, unspecified: Secondary | ICD-10-CM | POA: Diagnosis present

## 2023-03-09 DIAGNOSIS — E785 Hyperlipidemia, unspecified: Secondary | ICD-10-CM | POA: Diagnosis not present

## 2023-03-09 DIAGNOSIS — C8590 Non-Hodgkin lymphoma, unspecified, unspecified site: Secondary | ICD-10-CM | POA: Diagnosis not present

## 2023-03-09 DIAGNOSIS — N183 Chronic kidney disease, stage 3 unspecified: Secondary | ICD-10-CM | POA: Diagnosis not present

## 2023-03-09 DIAGNOSIS — Z452 Encounter for adjustment and management of vascular access device: Secondary | ICD-10-CM | POA: Insufficient documentation

## 2023-03-09 LAB — BASIC METABOLIC PANEL
Anion gap: 5 (ref 5–15)
BUN: 9 mg/dL (ref 8–23)
CO2: 26 mmol/L (ref 22–32)
Calcium: 8.8 mg/dL — ABNORMAL LOW (ref 8.9–10.3)
Chloride: 107 mmol/L (ref 98–111)
Creatinine, Ser: 1.11 mg/dL — ABNORMAL HIGH (ref 0.44–1.00)
GFR, Estimated: 52 mL/min — ABNORMAL LOW (ref >60)
Glucose, Bld: 122 mg/dL — ABNORMAL HIGH (ref 70–99)
Potassium: 3.8 mmol/L (ref 3.5–5.1)
Sodium: 138 mmol/L (ref 135–145)

## 2023-03-09 MED ORDER — DILTIAZEM HCL ER COATED BEADS 300 MG PO CP24
300.0000 mg | ORAL_CAPSULE | Freq: Every day | ORAL | 3 refills | Status: DC
Start: 2023-03-09 — End: 2023-05-28

## 2023-03-09 MED ORDER — SODIUM CHLORIDE 0.9% FLUSH
10.0000 mL | INTRAVENOUS | Status: DC | PRN
Start: 2023-03-09 — End: 2023-03-09
  Administered 2023-03-09: 10 mL via INTRAVENOUS

## 2023-03-09 MED ORDER — HEPARIN SOD (PORK) LOCK FLUSH 100 UNIT/ML IV SOLN
500.0000 [IU] | Freq: Once | INTRAVENOUS | Status: AC
  Administered 2023-03-09: 500 [IU] via INTRAVENOUS

## 2023-03-09 NOTE — Patient Instructions (Addendum)
Increase cardizem to 300mg once a day  

## 2023-03-09 NOTE — Patient Instructions (Signed)
 Central Line in Adults: What to Expect A central line is a soft tube called a catheter that is put into a vein in the neck, chest, arm or groin. The tip of the central line ends in a large vein just above the heart called the vena cava. A central line may be used to: Give medicines or fluids through an IV for a long time. Give you nutrients. Take blood or give you blood for testing or treatments. Give chemotherapy or dialysis. Provide IV access if veins in your hands or arms are difficult to use. Types of central lines There are four main types of central lines: Peripherally inserted central catheter (PICC) line. A PICC line goes up the upper arm to the vena cava. This is used for access of one week or longer. It can be used to draw blood, give fluids or medicines. Tunneled central line. It's placed in a large vein in the neck, chest, or groin. It's put in through a small cut is made over the vein, and then it's advanced to the vena cava. This is used for long-term therapy and dialysis. It's tunneled under the skin and brought out through a second cut. Non-tunneled central line. This type is put in the neck, chest or groin. It's usually used for 7 days at the most. It's often used in the emergency department. Implanted port. It's usually put in the upper chest, but it can also be placed in the upper arm or the belly. This is used for long-term therapy. It can stay in place longer than other types of central lines. It's put in and taken out with surgery, and it's accessed using a needle. The type of central line you get depends on how long you will need it, your medical condition, and the condition of your veins. Tell a health care provider about: Any allergies you have. All medicines you take. These include vitamins, herbs, eye drops, and creams. Any problems you or family members have had with anesthesia. Any bleeding problems you have. Any surgeries you've had. Any medical conditions  you have. Whether you're pregnant or may be pregnant. What are the risks? Your health care provider will talk with you about risks. These may include: Infection. A blood clot that blocks the central line or forms in the vein and travels to the heart. Bleeding. Getting a hole or crack in the central line. If this happens, the central line will need to be replaced. The catheter moving or coming out of place. A collapsed lung. Damage to other structures or organs. What happens before the procedure? Medicines Ask about changing or stopping: Any medicines you take. Any vitamins, herbs, or supplements you take. Do not take aspirin or ibuprofen unless you're told to. Surgery safety For your safety, you may: Need to wash your skin with a soap that kills germs. Get antibiotics. Have your procedure site marked. Have hair removed at the procedure site. General instructions Eat and drink as told. Ask if you'll be staying overnight in the hospital. If you'll be going home right after the procedure, plan to have a responsible adult: Drive you home from the hospital or clinic. You won't be allowed to drive. Stay with you for the time you're told. What happens during the procedure? An IV will be put into one of your veins. You may be given: A sedative to help you relax. Anesthesia to keep you from feeling pain. Your skin will be cleaned with a soap that kills germs, and  you may be covered with a clean sheet called a drape. Your blood pressure, heart rate, breathing rate, and blood oxygen level will be checked during the procedure. The central line catheter will be put into the vein and moved through to the correct spot. The provider may use X-rays to make sure the catheter goes to the right place. A bandage will be placed over the insertion area. These steps may vary. Ask what you can expect. What can I expect after the procedure? You will watched closely until you leave. This includes  checking your pain level, blood pressure, heart rate, and breathing rate. Caps may be put on the ends of the central line tubes. If you were given a sedative, do not drive or use machines until you're told it's safe. A sedative can make you sleepy. Follow these instructions at home: Flushing and cleaning the central line  Follow instructions from your provider about flushing and cleaning the central line and the area around it. Only use germ-free, also called sterile, supplies to flush the central line. Use supplies recommended by your provider. Before you flush the central line or clean the central line or the area around it: Wash your hands with soap and water for at least 20 seconds. If you can't use soap and water, use hand sanitizer. Clean the central line hub with rubbing alcohol. To do this: Clean the central line hub with a new alcohol wipe. Scrub it using a twisting motion and rub for 10 to 15 seconds or for 30 twists. Follow the manufacturer's instructions. Be sure you scrub the top of the hub, not just the sides. Let the hub dry before use. Prevent it from touching anything while drying. Do not re-use alcohol pads. Caring for your skin Check your central line site every day for signs of infection. Check for: Redness, swelling, or pain. Fluid or blood. Warmth. Pus or a bad smell. Keep the insertion site of your central line clean and dry at all times. Change your bandage only as told by your provider. Keep your bandage dry. If it gets wet, have it changed as soon as possible. Storage and throwing away supplies Keep your supplies in a clean, dry location. Throw away any used syringes in a disposal container that is meant for sharp items, called a sharps container. You can buy a sharps container from a pharmacy, or you can make one by using an empty hard plastic bottle with a cover. Place any used bandages or infusion bags into a plastic bag. Throw that bag in the trash. General  instructions Follow instructions from your provider for the type of device that you have. Keep the tube clamped unless it's being used. If you or someone else accidentally pulls on the tube, make sure: The bandage is OK. There is no bleeding. The tube has not been pulled out. Do not use scissors or sharp objects near the tube. Do not take baths, swim, or use a hot tub until you're told it's OK. Ask if you can shower. Ask what things are safe for you to do. Your provider may tell you not to lift anything or move your arm too much on the side of your central line. Contact a health care provider if: You have signs of an infection where the tube was put in. The skin is irritated near the bandage. You catheter appears to be getting longer. This may mean it's coming out of the vein. Get help right away if: You have:  A fever or chills. Shortness of breath. Pain in your chest. A fast heartbeat. Swelling in your neck, face, chest, or arm on the side of your central line. You feel dizzy or you faint. Your cut or central line site has red streaks spreading away from the area. Your cut or central line site is bleeding and won't stop. Your central line is hard to flush or will not flush or you do not get a blood return. Your central line gets loose, damaged or comes out. Your catheter leaks when flushed or when fluids are infused into it. This information is not intended to replace advice given to you by your health care provider. Make sure you discuss any questions you have with your health care provider. Document Revised: 07/27/2022 Document Reviewed: 07/27/2022 Elsevier Patient Education  2024 [employer].

## 2023-03-09 NOTE — Progress Notes (Signed)
 Primary Care Physician: Laurann Montana, MD Primary Cardiologist: Dr Anne Fu Primary Electrophysiologist: Dr Lalla Brothers Referring Physician: Dr Ardeen Fillers is a 78 y.o. female with a history of CKD, HTN, HLD, MDS, OSA, non-Hodgkin's lymphoma, atrial fibrillation who presents for follow up in the [facility]. The patient was initially diagnosed with atrial fibrillation remotely and had been maintained on amiodarone. This was discontinued for possible side effects. It was later felt that these symptoms were due to possible MDS and not amiodarone. The medication was resumed 04/18/21. She has had several ED visits for rapid afib requiring DCCV, most recently 04/20/21. She was back in afib the very next day. She has symptoms of fatigue and lightheadedness while in afib. Patient is on Eliquis for a CHADS2VASC score of 6. Patient is s/p DCCV on 05/15/21. She was recently diagnosed with non-Hodgkin's lymphoma. Patient is s/p DCCV on 02/06/22 which was unsuccessful after 3 shocks. Seen by Dr Lalla Brothers who felt her afib is now permanent, amiodarone discontinued and rate control increased. She is also s/p Watchman implant 02/04/23 with Dr Lalla Brothers.  Patient returns for follow up for atrial fibrillation and post Watchman implant. She reports that she has done well since the procedure with no chest pain or groin issues. She does occasionally note an elevated heart rate associated with fatigue. No current bleeding issues on anticoagulation.   Today, he denies symptoms of chest pain, shortness of breath, orthopnea, PND, lower extremity edema, dizziness, presyncope, syncope, snoring, daytime somnolence, bleeding, or neurologic sequela. The patient is tolerating medications without difficulties and is otherwise without complaint today.    Atrial Fibrillation Risk Factors:  she does have symptoms or diagnosis of sleep apnea. she does not have a history of rheumatic fever.   Atrial  Fibrillation Management history:  Previous antiarrhythmic drugs: amiodarone  Previous cardioversions: 2018 x 2, 03/22/21, 04/07/21, 04/20/21, 05/15/21, 02/06/22 Previous ablations: none Anticoagulation history: Eliquis   Past Medical History:  Diagnosis Date   (HFpEF) heart failure with preserved ejection fraction (HCC) 07/06/2016   A-fib (HCC)    paroxysmal   Acquired thrombophilia (HCC) 09/03/2021   Acute bronchitis 06/21/2020   Anemia 08/02/2020   Hgb 13.7-> 10.7 between 10/2019 and 07/2020   Anxiety    Arthritis    Benign neoplasm of colon 12/29/2011   Bilateral carpal tunnel syndrome 11/06/2015   Cataracts, bilateral    immature   Cervical spondylosis without myelopathy 11/06/2015   Chest pain 07/06/2016   Chronic cough    Chronic kidney disease, stage 3 unspecified (HCC) 03/26/2021   Chronic rhinitis 06/16/2017   CKD (chronic kidney disease) stage 3, GFR 30-59 ml/min (HCC)    Depression    Disequilibrium 08/01/2019   Drug-induced hypothyroidism 03/26/2021   Dyspnea on exertion 04/03/2016   PFT 08/02/20-mild obstruction, no resp to BD, mild reduction DLCO   Essential hypertension    Fatigue 07/06/2016   Gait instability 08/01/2019   Gastroesophageal reflux disease without esophagitis    Generalized anxiety disorder 11/25/2016   Generalized weakness 08/28/2021   Hardening of the aorta (main artery of the heart) (HCC) 03/26/2021   Hemorrhoid    History of blood transfusion 08/30/2021   while undergoing treatment for her lymphoma   History of bronchitis    couple of yrs ago   History of peristent atrial fibrillation    Hoarseness 05/12/2017   Hx of colonic polyps 05/24/2014   Hyperlipidemia    takes Pravastatin daily   Hypothyroidism 04/04/2018  Long term current use of amiodarone 10/19/2017   Loss of appetite 09/03/2021   Mitral regurgitation 02/27/2016   Moderate recurrent major depression (HCC) 09/03/2021   NHL (non-Hodgkin's lymphoma) (HCC) 09/03/2021   OSA  (obstructive sleep apnea) 10/19/2017   HST 09/24/2018- AHI 30.5/ hr, desaturation to 76%, body weight 249.6 lbs Unable to tolerate CPAP, failed twice.   Other specified disorders of bone density and structure, other site 09/03/2021   PAF (paroxysmal atrial fibrillation) (HCC) 02/17/2016   Pancytopenia (HCC) 08/29/2021   Persistent atrial fibrillation (HCC) 10/31/2016   Prediabetes 09/03/2021   Presence of Watchman left atrial appendage closure device 02/04/2023   27mm Watchman FLX Pro device placed by Dr. Lalla Brothers   Primary localized osteoarthritis of left knee 09/21/2016   Primary osteoarthritis of both first carpometacarpal joints 11/06/2015   Pruritus 10/31/2016   Rectocele 09/03/2021   Rhinitis, allergic 03/19/2017   S/P lumbar laminectomy 04/05/2013   Secondary hypercoagulable state (HCC) 04/28/2021   Sensorineural hearing loss (SNHL) of both ears 08/01/2019   Severe recurrent major depression without psychotic features (HCC) 11/25/2016   Splenomegaly 08/20/2021   Syncope 09/03/2021   Trigger finger 12/31/2022   middle finger on left and right hand   Upper airway cough syndrome 03/19/2017   Urinary urgency     Current Outpatient Medications  Medication Sig Dispense Refill   acetaminophen (TYLENOL) 500 MG tablet Take 1,000 mg by mouth every 6 (six) hours as needed for moderate pain.     albuterol (VENTOLIN HFA) 108 (90 Base) MCG/ACT inhaler Inhale 1-2 puffs into the lungs every 4 (four) hours as needed for wheezing or shortness of breath. 1 g 1   ALPRAZolam (XANAX) 0.5 MG tablet Take 1 tablet (0.5 mg total) by mouth 3 (three) times daily as needed for anxiety. 40 tablet 0   apixaban (ELIQUIS) 5 MG TABS tablet Take 1 tablet by mouth twice daily 60 tablet 5   benzonatate (TESSALON) 200 MG capsule Take 1 capsule by mouth three times daily as needed for cough 60 capsule 0   buPROPion (WELLBUTRIN XL) 150 MG 24 hr tablet Take 150 mg by mouth daily.     Calcium 250 MG CAPS Take 250 mg by  mouth daily.     dextromethorphan-guaiFENesin (MUCINEX DM) 30-600 MG 12hr tablet Take 1 tablet by mouth daily as needed for cough.     diltiazem (CARDIZEM CD) 240 MG 24 hr capsule Take 1 capsule (240 mg total) by mouth daily. 90 capsule 3   diltiazem (CARDIZEM) 30 MG tablet Take 1 tablet by mouth once if heart rate greater than 100bpm 90 tablet 1   furosemide (LASIX) 20 MG tablet Take 1 tablet (20 mg total) by mouth daily as needed for edema or fluid. 90 tablet 3   levothyroxine (SYNTHROID) 75 MCG tablet TAKE 1 TABLET DAILY BEFORE BREAKFAST (NEED APPOINTMENT FOR FURTHER REFILLS) 90 tablet 0   lidocaine-prilocaine (EMLA) cream Apply to Port-A-Cath site 1 to 2 hours prior to use 30 g 3   Magnesium 250 MG TABS Take 250 mg by mouth at bedtime.     polyethylene glycol (MIRALAX) 17 g packet Take 17 g by mouth daily. (Patient taking differently: Take 17 g by mouth daily as needed for mild constipation or moderate constipation.) 14 each 0   potassium chloride SA (KLOR-CON M) 20 MEQ tablet Take 1 tablet (20 mEq total) by mouth daily as needed (Take potassium only when you take the Furosemide (Lasix)). 90 tablet 2   pravastatin (PRAVACHOL)  40 MG tablet Take 40 mg by mouth at bedtime.     Spacer/Aero-Holding Chambers (AEROCHAMBER MV) inhaler Use as instructed 1 each 0   No current facility-administered medications for this encounter.   Facility-Administered Medications Ordered in Other Encounters  Medication Dose Route Frequency Provider Last Rate Last Admin   sodium chloride flush (NS) 0.9 % injection 10 mL  10 mL Intravenous PRN Ladene Artist, MD   10 mL at 12/01/21 0927    ROS- All systems are reviewed and negative except as per the HPI above.  Physical Exam: Vitals:   03/09/23 0844  BP: (!) 142/88  Pulse: (!) 118  Weight: 106.7 kg  Height: 5\' 7"  (1.702 m)    GEN: Well nourished, well developed in no acute distress NECK: No JVD; No carotid bruits CARDIAC: Irregularly irregular rate and  rhythm, no murmurs, rubs, gallops RESPIRATORY:  Clear to auscultation without rales, wheezing or rhonchi  ABDOMEN: Soft, non-tender, non-distended EXTREMITIES:  No edema; No deformity    Wt Readings from Last 3 Encounters:  03/09/23 106.7 kg  02/04/23 105.7 kg  01/12/23 106.1 kg    EKG today demonstrates  Afib with elevated rates Vent. rate 118 BPM PR interval * ms QRS duration 82 ms QT/QTcB 318/445 ms   Echo 11/21/21 demonstrated   1. Left ventricular ejection fraction, by estimation, is 60 to 65%. The  left ventricle has normal function. The left ventricle has no regional  wall motion abnormalities. Left ventricular diastolic parameters were  normal.   2. Right ventricular systolic function is normal. The right ventricular  size is normal. There is moderately elevated pulmonary artery systolic  pressure.   3. Left atrial size was moderately dilated.   4. The mitral valve is degenerative. Mild mitral valve regurgitation. No  evidence of mitral stenosis.   5. The aortic valve is tricuspid. Aortic valve regurgitation is not  visualized. No aortic stenosis is present.   6. The inferior vena cava is dilated in size with >50% respiratory  variability, suggesting right atrial pressure of 8 mmHg.   Epic records are reviewed at length today   HAS-BLED score 2 Hypertension Yes  Abnormal renal and liver function (Dialysis, transplant, Cr >2.26 mg/dL /Cirrhosis or Bilirubin >2x Normal or AST/ALT/AP >3x Normal) No  Stroke No  Bleeding No  Labile INR (Unstable/high INR) No  Elderly (>65) Yes  Drugs or alcohol (>= 8 drinks/week, anti-plt or NSAID) No   CHA2DS2-VASc Score = 5  The patient's score is based upon: CHF History: 1 HTN History: 1 Diabetes History: 0 Stroke History: 0 Vascular Disease History: 0 Age Score: 2 Gender Score: 1       ASSESSMENT AND PLAN: Permanent Atrial Fibrillation/atrial flutter The patient's CHA2DS2-VASc score is 5, indicating a 7.2% annual  risk of stroke.   Patient noted elevated rates at home and elevated here today. Will increase diltiazem to 300 mg daily.  Continue Eliquis 5 mg BID for now Continue diltiazem 30 mg PRN q 4 hours for heart racing.  Secondary Hypercoagulable State (ICD10:  D68.69) The patient is at significant risk for stroke/thromboembolism based upon her CHA2DS2-VASc Score of 5.  Continue Apixaban (Eliquis) for now. Instructions for CT provided. If CT scan shows Watchman is well seated with no thrombus, will discontinue Eliquis and start Plavix 75 mg daily. Check bmet today.   OSA  Patient not currently on CPAP  HTN Stable increase diltiazem as above.   Chronic HFpEF EF 60-65% GDMT per primary cardiology  team Fluid status appears stable today NYHA class I symptoms.     Follow up in the AF clinic in 2 months.    Jorja Loa PA-C [facility] [address] [place], [place] [address] [phone] 03/09/2023 8:52 AM

## 2023-03-10 ENCOUNTER — Ambulatory Visit: Payer: HMO

## 2023-03-12 ENCOUNTER — Ambulatory Visit: Payer: Medicare HMO | Admitting: Cardiology

## 2023-03-16 ENCOUNTER — Ambulatory Visit: Payer: Medicare HMO | Admitting: Physician Assistant

## 2023-03-17 DIAGNOSIS — M25562 Pain in left knee: Secondary | ICD-10-CM | POA: Diagnosis not present

## 2023-03-17 DIAGNOSIS — M47816 Spondylosis without myelopathy or radiculopathy, lumbar region: Secondary | ICD-10-CM | POA: Diagnosis not present

## 2023-04-06 DIAGNOSIS — F331 Major depressive disorder, recurrent, moderate: Secondary | ICD-10-CM | POA: Diagnosis not present

## 2023-04-06 DIAGNOSIS — F419 Anxiety disorder, unspecified: Secondary | ICD-10-CM | POA: Diagnosis not present

## 2023-04-07 ENCOUNTER — Ambulatory Visit (HOSPITAL_COMMUNITY)
Admission: RE | Admit: 2023-04-07 | Discharge: 2023-04-07 | Disposition: A | Discharge status: Home / Self Care | Payer: HMO | Source: Ambulatory Visit | Attending: Family Medicine | Admitting: Family Medicine

## 2023-04-07 DIAGNOSIS — I4821 Permanent atrial fibrillation: Secondary | ICD-10-CM | POA: Diagnosis not present

## 2023-04-07 DIAGNOSIS — Z95818 Presence of other cardiac implants and grafts: Secondary | ICD-10-CM | POA: Diagnosis not present

## 2023-04-07 MED ORDER — DILTIAZEM HCL 25 MG/5ML IV SOLN
5.0000 mg | Freq: Once | INTRAVENOUS | Status: AC
  Administered 2023-04-07: 5 mg via INTRAVENOUS

## 2023-04-07 MED ORDER — IOHEXOL 350 MG/ML SOLN
95.0000 mL | Freq: Once | INTRAVENOUS | Status: AC | PRN
  Administered 2023-04-07: 95 mL via INTRAVENOUS

## 2023-04-07 MED ORDER — DILTIAZEM HCL 25 MG/5ML IV SOLN
INTRAVENOUS | Status: AC
  Filled 2023-04-07: qty 5

## 2023-04-10 ENCOUNTER — Encounter: Payer: Self-pay | Admitting: Cardiology

## 2023-04-12 ENCOUNTER — Telehealth: Payer: Self-pay

## 2023-04-12 MED ORDER — CLOPIDOGREL BISULFATE 75 MG PO TABS: 75.0000 mg | ORAL_TABLET | Freq: Every day | ORAL | 1 refills | Status: DC

## 2023-04-12 NOTE — Telephone Encounter (Signed)
 Reviewed results with patient who verbalized understanding.   Instructed the patient to STOP ELIQUIS and START PLAVIX 75 mg once daily. She was grateful for call and agreed with plan.

## 2023-04-20 ENCOUNTER — Ambulatory Visit (INDEPENDENT_AMBULATORY_CARE_PROVIDER_SITE_OTHER)
Admission: EM | Admit: 2023-04-20 | Discharge: 2023-04-20 | Disposition: A | Discharge status: Other Acute Inpatient Hospital | Attending: Psychiatry | Admitting: Psychiatry

## 2023-04-20 ENCOUNTER — Other Ambulatory Visit: Payer: Self-pay

## 2023-04-20 ENCOUNTER — Inpatient Hospital Stay (HOSPITAL_COMMUNITY)
Admission: EM | Admit: 2023-04-20 | Discharge: 2023-04-25 | DRG: 840 | Disposition: A | Discharge status: Hospice | Attending: Internal Medicine | Admitting: Internal Medicine

## 2023-04-20 ENCOUNTER — Inpatient Hospital Stay (HOSPITAL_COMMUNITY)

## 2023-04-20 ENCOUNTER — Encounter: Payer: Self-pay | Admitting: Oncology

## 2023-04-20 ENCOUNTER — Emergency Department (HOSPITAL_COMMUNITY)

## 2023-04-20 DIAGNOSIS — H903 Sensorineural hearing loss, bilateral: Secondary | ICD-10-CM | POA: Diagnosis present

## 2023-04-20 DIAGNOSIS — I959 Hypotension, unspecified: Secondary | ICD-10-CM | POA: Insufficient documentation

## 2023-04-20 DIAGNOSIS — E039 Hypothyroidism, unspecified: Secondary | ICD-10-CM | POA: Diagnosis not present

## 2023-04-20 DIAGNOSIS — E785 Hyperlipidemia, unspecified: Secondary | ICD-10-CM | POA: Diagnosis present

## 2023-04-20 DIAGNOSIS — R5383 Other fatigue: Secondary | ICD-10-CM | POA: Insufficient documentation

## 2023-04-20 DIAGNOSIS — N1831 Chronic kidney disease, stage 3a: Secondary | ICD-10-CM | POA: Diagnosis present

## 2023-04-20 DIAGNOSIS — N183 Chronic kidney disease, stage 3 unspecified: Secondary | ICD-10-CM | POA: Insufficient documentation

## 2023-04-20 DIAGNOSIS — G936 Cerebral edema: Secondary | ICD-10-CM | POA: Diagnosis not present

## 2023-04-20 DIAGNOSIS — Z66 Do not resuscitate: Secondary | ICD-10-CM | POA: Diagnosis not present

## 2023-04-20 DIAGNOSIS — I130 Hypertensive heart and chronic kidney disease with heart failure and stage 1 through stage 4 chronic kidney disease, or unspecified chronic kidney disease: Secondary | ICD-10-CM | POA: Diagnosis present

## 2023-04-20 DIAGNOSIS — Z1152 Encounter for screening for COVID-19: Secondary | ICD-10-CM

## 2023-04-20 DIAGNOSIS — I071 Rheumatic tricuspid insufficiency: Secondary | ICD-10-CM | POA: Diagnosis present

## 2023-04-20 DIAGNOSIS — Z800 Family history of malignant neoplasm of digestive organs: Secondary | ICD-10-CM

## 2023-04-20 DIAGNOSIS — I6782 Cerebral ischemia: Secondary | ICD-10-CM | POA: Diagnosis not present

## 2023-04-20 DIAGNOSIS — E871 Hypo-osmolality and hyponatremia: Secondary | ICD-10-CM | POA: Diagnosis present

## 2023-04-20 DIAGNOSIS — F05 Delirium due to known physiological condition: Secondary | ICD-10-CM | POA: Diagnosis not present

## 2023-04-20 DIAGNOSIS — R4589 Other symptoms and signs involving emotional state: Secondary | ICD-10-CM | POA: Diagnosis not present

## 2023-04-20 DIAGNOSIS — Z96652 Presence of left artificial knee joint: Secondary | ICD-10-CM | POA: Diagnosis present

## 2023-04-20 DIAGNOSIS — Z8571 Personal history of Hodgkin lymphoma: Secondary | ICD-10-CM | POA: Diagnosis not present

## 2023-04-20 DIAGNOSIS — Z6834 Body mass index (BMI) 34.0-34.9, adult: Secondary | ICD-10-CM

## 2023-04-20 DIAGNOSIS — Z7901 Long term (current) use of anticoagulants: Secondary | ICD-10-CM

## 2023-04-20 DIAGNOSIS — G4733 Obstructive sleep apnea (adult) (pediatric): Secondary | ICD-10-CM | POA: Diagnosis present

## 2023-04-20 DIAGNOSIS — Z95818 Presence of other cardiac implants and grafts: Secondary | ICD-10-CM | POA: Diagnosis not present

## 2023-04-20 DIAGNOSIS — R531 Weakness: Secondary | ICD-10-CM | POA: Insufficient documentation

## 2023-04-20 DIAGNOSIS — Z8572 Personal history of non-Hodgkin lymphomas: Secondary | ICD-10-CM | POA: Insufficient documentation

## 2023-04-20 DIAGNOSIS — E860 Dehydration: Secondary | ICD-10-CM | POA: Diagnosis present

## 2023-04-20 DIAGNOSIS — Z8249 Family history of ischemic heart disease and other diseases of the circulatory system: Secondary | ICD-10-CM

## 2023-04-20 DIAGNOSIS — G939 Disorder of brain, unspecified: Secondary | ICD-10-CM | POA: Diagnosis not present

## 2023-04-20 DIAGNOSIS — N179 Acute kidney failure, unspecified: Secondary | ICD-10-CM | POA: Insufficient documentation

## 2023-04-20 DIAGNOSIS — M19041 Primary osteoarthritis, right hand: Secondary | ICD-10-CM | POA: Diagnosis present

## 2023-04-20 DIAGNOSIS — G9389 Other specified disorders of brain: Secondary | ICD-10-CM | POA: Diagnosis not present

## 2023-04-20 DIAGNOSIS — R836 Abnormal cytological findings in cerebrospinal fluid: Secondary | ICD-10-CM | POA: Diagnosis not present

## 2023-04-20 DIAGNOSIS — M19042 Primary osteoarthritis, left hand: Secondary | ICD-10-CM | POA: Diagnosis present

## 2023-04-20 DIAGNOSIS — F411 Generalized anxiety disorder: Secondary | ICD-10-CM | POA: Diagnosis present

## 2023-04-20 DIAGNOSIS — Z79899 Other long term (current) drug therapy: Secondary | ICD-10-CM | POA: Diagnosis not present

## 2023-04-20 DIAGNOSIS — C8510 Unspecified B-cell lymphoma, unspecified site: Secondary | ICD-10-CM | POA: Diagnosis not present

## 2023-04-20 DIAGNOSIS — Z7902 Long term (current) use of antithrombotics/antiplatelets: Secondary | ICD-10-CM

## 2023-04-20 DIAGNOSIS — Z79890 Hormone replacement therapy (postmenopausal): Secondary | ICD-10-CM

## 2023-04-20 DIAGNOSIS — Z888 Allergy status to other drugs, medicaments and biological substances status: Secondary | ICD-10-CM

## 2023-04-20 DIAGNOSIS — Z95828 Presence of other vascular implants and grafts: Secondary | ICD-10-CM

## 2023-04-20 DIAGNOSIS — Z515 Encounter for palliative care: Secondary | ICD-10-CM | POA: Diagnosis not present

## 2023-04-20 DIAGNOSIS — I4891 Unspecified atrial fibrillation: Secondary | ICD-10-CM | POA: Insufficient documentation

## 2023-04-20 DIAGNOSIS — I5032 Chronic diastolic (congestive) heart failure: Secondary | ICD-10-CM | POA: Diagnosis not present

## 2023-04-20 DIAGNOSIS — N390 Urinary tract infection, site not specified: Secondary | ICD-10-CM | POA: Diagnosis present

## 2023-04-20 DIAGNOSIS — C8590 Non-Hodgkin lymphoma, unspecified, unspecified site: Secondary | ICD-10-CM

## 2023-04-20 DIAGNOSIS — C8339 Primary central nervous system lymphoma: Principal | ICD-10-CM | POA: Diagnosis present

## 2023-04-20 DIAGNOSIS — I517 Cardiomegaly: Secondary | ICD-10-CM | POA: Diagnosis not present

## 2023-04-20 DIAGNOSIS — R404 Transient alteration of awareness: Secondary | ICD-10-CM | POA: Diagnosis not present

## 2023-04-20 DIAGNOSIS — Z880 Allergy status to penicillin: Secondary | ICD-10-CM

## 2023-04-20 DIAGNOSIS — I480 Paroxysmal atrial fibrillation: Secondary | ICD-10-CM | POA: Insufficient documentation

## 2023-04-20 DIAGNOSIS — D6959 Other secondary thrombocytopenia: Secondary | ICD-10-CM | POA: Diagnosis present

## 2023-04-20 DIAGNOSIS — I10 Essential (primary) hypertension: Secondary | ICD-10-CM | POA: Diagnosis present

## 2023-04-20 DIAGNOSIS — K219 Gastro-esophageal reflux disease without esophagitis: Secondary | ICD-10-CM | POA: Diagnosis present

## 2023-04-20 DIAGNOSIS — F32A Depression, unspecified: Secondary | ICD-10-CM | POA: Diagnosis present

## 2023-04-20 DIAGNOSIS — Z9221 Personal history of antineoplastic chemotherapy: Secondary | ICD-10-CM

## 2023-04-20 DIAGNOSIS — I482 Chronic atrial fibrillation, unspecified: Secondary | ICD-10-CM | POA: Diagnosis not present

## 2023-04-20 DIAGNOSIS — E669 Obesity, unspecified: Secondary | ICD-10-CM | POA: Diagnosis not present

## 2023-04-20 DIAGNOSIS — I5030 Unspecified diastolic (congestive) heart failure: Secondary | ICD-10-CM | POA: Diagnosis present

## 2023-04-20 DIAGNOSIS — Z7189 Other specified counseling: Secondary | ICD-10-CM

## 2023-04-20 DIAGNOSIS — G319 Degenerative disease of nervous system, unspecified: Secondary | ICD-10-CM | POA: Diagnosis not present

## 2023-04-20 DIAGNOSIS — I4821 Permanent atrial fibrillation: Secondary | ICD-10-CM | POA: Diagnosis present

## 2023-04-20 DIAGNOSIS — R0602 Shortness of breath: Secondary | ICD-10-CM | POA: Diagnosis not present

## 2023-04-20 DIAGNOSIS — R918 Other nonspecific abnormal finding of lung field: Secondary | ICD-10-CM | POA: Diagnosis not present

## 2023-04-20 DIAGNOSIS — E876 Hypokalemia: Secondary | ICD-10-CM | POA: Diagnosis present

## 2023-04-20 DIAGNOSIS — Z881 Allergy status to other antibiotic agents status: Secondary | ICD-10-CM

## 2023-04-20 DIAGNOSIS — C8330 Diffuse large B-cell lymphoma, unspecified site: Secondary | ICD-10-CM | POA: Diagnosis not present

## 2023-04-20 LAB — CBC WITH DIFFERENTIAL/PLATELET
Abs Immature Granulocytes: 0.02 10*3/uL (ref 0.00–0.07)
Basophils Absolute: 0 10*3/uL (ref 0.0–0.1)
Basophils Relative: 1 %
Eosinophils Absolute: 0 10*3/uL (ref 0.0–0.5)
Eosinophils Relative: 1 %
HCT: 45.8 % (ref 36.0–46.0)
Hemoglobin: 15.5 g/dL — ABNORMAL HIGH (ref 12.0–15.0)
Immature Granulocytes: 0 %
Lymphocytes Relative: 24 %
Lymphs Abs: 1.2 10*3/uL (ref 0.7–4.0)
MCH: 33.8 pg (ref 26.0–34.0)
MCHC: 33.8 g/dL (ref 30.0–36.0)
MCV: 99.8 fL (ref 80.0–100.0)
Monocytes Absolute: 0.9 10*3/uL (ref 0.1–1.0)
Monocytes Relative: 17 %
Neutro Abs: 2.9 10*3/uL (ref 1.7–7.7)
Neutrophils Relative %: 57 %
Platelets: 76 10*3/uL — ABNORMAL LOW (ref 150–400)
RBC: 4.59 MIL/uL (ref 3.87–5.11)
RDW: 14 % (ref 11.5–15.5)
WBC: 5.1 10*3/uL (ref 4.0–10.5)
nRBC: 0 % (ref 0.0–0.2)

## 2023-04-20 LAB — COMPREHENSIVE METABOLIC PANEL WITH GFR
ALT: 15 U/L (ref 0–44)
AST: 24 U/L (ref 15–41)
Albumin: 3.9 g/dL (ref 3.5–5.0)
Alkaline Phosphatase: 85 U/L (ref 38–126)
Anion gap: 13 (ref 5–15)
BUN: 31 mg/dL — ABNORMAL HIGH (ref 8–23)
CO2: 24 mmol/L (ref 22–32)
Calcium: 9.6 mg/dL (ref 8.9–10.3)
Chloride: 97 mmol/L — ABNORMAL LOW (ref 98–111)
Creatinine, Ser: 1.94 mg/dL — ABNORMAL HIGH (ref 0.44–1.00)
GFR, Estimated: 26 mL/min — ABNORMAL LOW (ref >60)
Glucose, Bld: 135 mg/dL — ABNORMAL HIGH (ref 70–99)
Potassium: 4.1 mmol/L (ref 3.5–5.1)
Sodium: 134 mmol/L — ABNORMAL LOW (ref 135–145)
Total Bilirubin: 1.4 mg/dL — ABNORMAL HIGH (ref 0.0–1.2)
Total Protein: 6.8 g/dL (ref 6.5–8.1)

## 2023-04-20 LAB — RESP PANEL BY RT-PCR (RSV, FLU A&B, COVID)  RVPGX2
Influenza A by PCR: NEGATIVE
Influenza B by PCR: NEGATIVE
Resp Syncytial Virus by PCR: NEGATIVE
SARS Coronavirus 2 by RT PCR: NEGATIVE

## 2023-04-20 LAB — TROPONIN I (HIGH SENSITIVITY)
Troponin I (High Sensitivity): 14 ng/L (ref <18)
Troponin I (High Sensitivity): 12 ng/L (ref <18)

## 2023-04-20 LAB — MAGNESIUM: Magnesium: 2.1 mg/dL (ref 1.7–2.4)

## 2023-04-20 LAB — AMMONIA: Ammonia: 13 umol/L (ref 9–35)

## 2023-04-20 LAB — BRAIN NATRIURETIC PEPTIDE: B Natriuretic Peptide: 71 pg/mL (ref 0.0–100.0)

## 2023-04-20 LAB — TSH: TSH: 0.818 u[IU]/mL (ref 0.350–4.500)

## 2023-04-20 LAB — LIPASE, BLOOD: Lipase: 25 U/L (ref 11–51)

## 2023-04-20 LAB — MRSA NEXT GEN BY PCR, NASAL: MRSA by PCR Next Gen: NOT DETECTED

## 2023-04-20 MED ORDER — CHLORHEXIDINE GLUCONATE CLOTH 2 % EX PADS: 6.0000 | MEDICATED_PAD | Freq: Every day | CUTANEOUS | Status: DC

## 2023-04-20 MED ORDER — PRAVASTATIN SODIUM 40 MG PO TABS
40.0000 mg | ORAL_TABLET | Freq: Every day | ORAL | Status: DC
  Administered 2023-04-20 – 2023-04-24 (×5): 40 mg via ORAL
  Filled 2023-04-20 (×4): qty 2
  Filled 2023-04-20: qty 1

## 2023-04-20 MED ORDER — DILTIAZEM LOAD VIA INFUSION
15.0000 mg | Freq: Once | INTRAVENOUS | Status: AC
  Administered 2023-04-20: 15 mg via INTRAVENOUS
  Filled 2023-04-20: qty 15

## 2023-04-20 MED ORDER — ENOXAPARIN SODIUM 30 MG/0.3ML IJ SOSY
30.0000 mg | PREFILLED_SYRINGE | INTRAMUSCULAR | Status: DC
  Administered 2023-04-20: 30 mg via SUBCUTANEOUS
  Filled 2023-04-20: qty 0.3

## 2023-04-20 MED ORDER — CLOPIDOGREL BISULFATE 75 MG PO TABS
75.0000 mg | ORAL_TABLET | Freq: Every day | ORAL | Status: DC
  Administered 2023-04-20 – 2023-04-25 (×6): 75 mg via ORAL
  Filled 2023-04-20 (×6): qty 1

## 2023-04-20 MED ORDER — ALPRAZOLAM 0.5 MG PO TABS: 0.5000 mg | ORAL_TABLET | Freq: Three times a day (TID) | ORAL | Status: DC | PRN

## 2023-04-20 MED ORDER — LEVOTHYROXINE SODIUM 50 MCG PO TABS
75.0000 ug | ORAL_TABLET | Freq: Every day | ORAL | Status: DC
  Administered 2023-04-21 – 2023-04-25 (×5): 75 ug via ORAL
  Filled 2023-04-20 (×5): qty 1

## 2023-04-20 MED ORDER — CHLORHEXIDINE GLUCONATE CLOTH 2 % EX PADS
6.0000 | MEDICATED_PAD | Freq: Every day | CUTANEOUS | Status: DC
  Administered 2023-04-20 – 2023-04-23 (×4): 6 via TOPICAL

## 2023-04-20 MED ORDER — SODIUM CHLORIDE 0.9 % IV BOLUS
500.0000 mL | Freq: Once | INTRAVENOUS | Status: AC
  Administered 2023-04-20: 500 mL via INTRAVENOUS

## 2023-04-20 MED ORDER — ACETAMINOPHEN 500 MG PO TABS
1000.0000 mg | ORAL_TABLET | Freq: Four times a day (QID) | ORAL | Status: DC | PRN
  Administered 2023-04-21 – 2023-04-24 (×3): 1000 mg via ORAL
  Filled 2023-04-20 (×3): qty 2

## 2023-04-20 MED ORDER — SODIUM CHLORIDE 0.9 % IV SOLN: INTRAVENOUS | Status: AC

## 2023-04-20 MED ORDER — POLYETHYLENE GLYCOL 3350 17 G PO PACK
17.0000 g | PACK | Freq: Every day | ORAL | Status: DC
  Administered 2023-04-21 – 2023-04-25 (×4): 17 g via ORAL
  Filled 2023-04-20 (×5): qty 1

## 2023-04-20 MED ORDER — DM-GUAIFENESIN ER 30-600 MG PO TB12
1.0000 | ORAL_TABLET | Freq: Every day | ORAL | Status: AC | PRN
Start: 2023-04-20 — End: ?

## 2023-04-20 MED ORDER — DILTIAZEM HCL-DEXTROSE 125-5 MG/125ML-% IV SOLN (PREMIX)
5.0000 mg/h | INTRAVENOUS | Status: DC
  Administered 2023-04-20: 15 mg/h via INTRAVENOUS
  Administered 2023-04-20: 5 mg/h via INTRAVENOUS
  Administered 2023-04-21 – 2023-04-22 (×5): 15 mg/h via INTRAVENOUS
  Administered 2023-04-23: 12.5 mg/h via INTRAVENOUS
  Filled 2023-04-20 (×11): qty 125

## 2023-04-20 MED ORDER — BUPROPION HCL ER (XL) 150 MG PO TB24
150.0000 mg | ORAL_TABLET | Freq: Every day | ORAL | Status: DC
  Administered 2023-04-20 – 2023-04-25 (×6): 150 mg via ORAL
  Filled 2023-04-20 (×6): qty 1

## 2023-04-20 NOTE — Progress Notes (Signed)
   04/20/23 1039  [facility] Triage Screening (Walk-ins at [facility] only)  How Did You Hear About [place]? Family/Friend  What Is the Reason for Your Visit/Call Today? Kelty Sandall presents to [facility] voluntarily accompanied by her husband. Pt states that she is going through major depression. Pt states that nothing hurt. Pt currently denies SI, HI, AVH and alcohol/drug use. Per husband, pt hasn't really been eating or drinking. Pt had a few straberries last night but nothing as of yet this morning. Pt had a few sips of water this morning but nothing else.  How Long Has This Been Causing You Problems? <Week  Have You Recently Had Any Thoughts About Hurting Yourself? No  Are You Planning to Commit Suicide/Harm Yourself At This time? No  Have you Recently Had Thoughts About Hurting Someone Karolee Ohs? No  Are You Planning To Harm Someone At This Time? No  Physical Abuse Denies  Verbal Abuse Denies  Sexual Abuse Denies  Exploitation of patient/patient's resources Denies  Self-Neglect Denies  Are you currently experiencing any auditory, visual or other hallucinations? No  Have You Used Any Alcohol or Drugs in the Past 24 Hours? No  Do you have any current medical co-morbidities that require immediate attention? No  Clinician description of patient physical appearance/behavior: groomed, soft spoken, cooperative  What Do You Feel Would Help You the Most Today? Treatment for Depression or other mood problem  If access to [facility] was not available, would you have sought care in the Emergency Department? No  Determination of Need Routine (7 days)  Options For Referral Medication Management;Geropsychiatric Facility;Outpatient Therapy

## 2023-04-20 NOTE — ED Notes (Signed)
 EMS transport called & setup

## 2023-04-20 NOTE — Discharge Instructions (Addendum)
 Going to [facility] ED accepting Dr. Hyacinth Meeker

## 2023-04-20 NOTE — ED Provider Notes (Signed)
 Emergency Department Provider Note   I have reviewed the triage vital signs and the nursing notes.   HISTORY  Chief Complaint Atrial Fibrillation   HPI [name] [name] is a 78 y.o. female with PMH of A fib with Watchman, CKD, hypothyroidism, and CHF presents to the emergency department with weakness and fatigue over the past 7 days.  Husband at bedside initially thought this might be due to changing some of her Wellbutrin dosing and other psychiatric meds.  They started at behavioral health urgent care and after evaluation was referred to the ED. according to husband the patient has been in and out of A-fib intermittently.  She is typically asymptomatic.  The patient has no current palpitations, chest pain, shortness of breath.  She is no longer anticoagulated after placement of a Watchman in January.  She has been cardioverted many times with has been seeing the last time they attempted cardioversion multiple times without success. Her appetite has been very poor in the last week as well. No obvious fever.    Past Medical History:  Diagnosis Date   (HFpEF) heart failure with preserved ejection fraction (HCC) 07/06/2016   A-fib (HCC)    paroxysmal   Acquired thrombophilia (HCC) 09/03/2021   Acute bronchitis 06/21/2020   Anemia 08/02/2020   Hgb 13.7-> 10.7 between 10/2019 and 07/2020   Anxiety    Arthritis    Benign neoplasm of colon 12/29/2011   Bilateral carpal tunnel syndrome 11/06/2015   Cataracts, bilateral    immature   Cervical spondylosis without myelopathy 11/06/2015   Chest pain 07/06/2016   Chronic cough    Chronic kidney disease, stage 3 unspecified (HCC) 03/26/2021   Chronic rhinitis 06/16/2017   CKD (chronic kidney disease) stage 3, GFR 30-59 ml/min (HCC)    Depression    Disequilibrium 08/01/2019   Drug-induced hypothyroidism 03/26/2021   Dyspnea on exertion 04/03/2016   PFT 08/02/20-mild obstruction, no resp to BD, mild reduction DLCO   Essential hypertension     Fatigue 07/06/2016   Gait instability 08/01/2019   Gastroesophageal reflux disease without esophagitis    Generalized anxiety disorder 11/25/2016   Generalized weakness 08/28/2021   Hardening of the aorta (main artery of the heart) (HCC) 03/26/2021   Hemorrhoid    History of blood transfusion 08/30/2021   while undergoing treatment for her lymphoma   History of bronchitis    couple of yrs ago   History of peristent atrial fibrillation    Hoarseness 05/12/2017   Hx of colonic polyps 05/24/2014   Hyperlipidemia    takes Pravastatin daily   Hypothyroidism 04/04/2018   Hana Trippett term current use of amiodarone 10/19/2017   Loss of appetite 09/03/2021   Mitral regurgitation 02/27/2016   Moderate recurrent major depression (HCC) 09/03/2021   NHL (non-Hodgkin's lymphoma) (HCC) 09/03/2021   OSA (obstructive sleep apnea) 10/19/2017   HST 09/24/2018- AHI 30.5/ hr, desaturation to 76%, body weight 249.6 lbs Unable to tolerate CPAP, failed twice.   Other specified disorders of bone density and structure, other site 09/03/2021   PAF (paroxysmal atrial fibrillation) (HCC) 02/17/2016   Pancytopenia (HCC) 08/29/2021   Persistent atrial fibrillation (HCC) 10/31/2016   Prediabetes 09/03/2021   Presence of Watchman left atrial appendage closure device 02/04/2023   27mm Watchman FLX Pro device placed by Dr. Lalla Brothers   Primary localized osteoarthritis of left knee 09/21/2016   Primary osteoarthritis of both first carpometacarpal joints 11/06/2015   Pruritus 10/31/2016   Rectocele 09/03/2021   Rhinitis, allergic 03/19/2017  S/P lumbar laminectomy 04/05/2013   Secondary hypercoagulable state (HCC) 04/28/2021   Sensorineural hearing loss (SNHL) of both ears 08/01/2019   Severe recurrent major depression without psychotic features (HCC) 11/25/2016   Splenomegaly 08/20/2021   Syncope 09/03/2021   Trigger finger 12/31/2022   middle finger on left and right hand   Upper airway cough syndrome 03/19/2017    Urinary urgency     Review of Systems  Constitutional: No fever/chills. Positive generalized weakness.  Cardiovascular: Denies chest pain. Respiratory: Denies shortness of breath. Gastrointestinal: No abdominal pain.  No nausea, no vomiting.  Skin: Negative for rash. Neurological: Negative for headaches.  ____________________________________________   PHYSICAL EXAM:  VITAL SIGNS: ED Triage Vitals [04/20/23 1356]  Encounter Vitals Group     BP (!) 141/71     Pulse Rate (!) 172     Resp (!) 26     Temp 97.9 F (36.6 C)     Temp src      SpO2 96 %   Constitutional: Alert and oriented. Appears fatigued but answers questions and participates with exam.  Eyes: Conjunctivae are normal. Head: Atraumatic. Nose: No congestion/rhinnorhea. Mouth/Throat: Mucous membranes are slightly dry.  Neck: No stridor.  Cardiovascular: A fib RVR. Good peripheral circulation. Grossly normal heart sounds.   Respiratory: Normal respiratory effort.  No retractions. Lungs CTAB. Gastrointestinal: Soft and nontender. No distention.  Musculoskeletal: No lower extremity tenderness nor edema. No gross deformities of extremities. Neurologic:  Normal speech and language. Skin:  Skin is warm, dry and intact. No rash noted.  ____________________________________________   LABS (all labs ordered are listed, but only abnormal results are displayed)  Labs Reviewed  URINE CULTURE - Abnormal; Notable for the following components:      Result Value   Culture 20,000 COLONIES/mL ESCHERICHIA COLI (*)    Organism ID, Bacteria ESCHERICHIA COLI (*)    All other components within normal limits  COMPREHENSIVE METABOLIC PANEL WITH GFR - Abnormal; Notable for the following components:   Sodium 134 (*)    Chloride 97 (*)    Glucose, Bld 135 (*)    BUN 31 (*)    Creatinine, Ser 1.94 (*)    Total Bilirubin 1.4 (*)    GFR, Estimated 26 (*)    All other components within normal limits  CBC WITH  DIFFERENTIAL/PLATELET - Abnormal; Notable for the following components:   Hemoglobin 15.5 (*)    Platelets 76 (*)    All other components within normal limits  BASIC METABOLIC PANEL WITH GFR - Abnormal; Notable for the following components:   Sodium 129 (*)    Potassium 3.2 (*)    Chloride 97 (*)    Glucose, Bld 127 (*)    BUN 29 (*)    Creatinine, Ser 1.56 (*)    Calcium 8.4 (*)    GFR, Estimated 34 (*)    All other components within normal limits  CBC - Abnormal; Notable for the following components:   WBC 3.6 (*)    RBC 3.76 (*)    MCV 100.5 (*)    Platelets 63 (*)    All other components within normal limits  URINALYSIS, ROUTINE W REFLEX MICROSCOPIC - Abnormal; Notable for the following components:   APPearance HAZY (*)    Leukocytes,Ua SMALL (*)    Bacteria, UA MANY (*)    All other components within normal limits  LACTATE DEHYDROGENASE - Abnormal; Notable for the following components:   LDH 263 (*)    All other components  within normal limits  BASIC METABOLIC PANEL WITH GFR - Abnormal; Notable for the following components:   Glucose, Bld 112 (*)    Creatinine, Ser 1.19 (*)    Calcium 8.8 (*)    GFR, Estimated 47 (*)    All other components within normal limits  CBC - Abnormal; Notable for the following components:   WBC 2.8 (*)    MCV 101.8 (*)    Platelets 58 (*)    All other components within normal limits  GLUCOSE, CAPILLARY - Abnormal; Notable for the following components:   Glucose-Capillary 113 (*)    All other components within normal limits  CBC - Abnormal; Notable for the following components:   WBC 2.9 (*)    RBC 3.63 (*)    MCV 100.6 (*)    Platelets 72 (*)    All other components within normal limits  CSF CELL COUNT WITH DIFFERENTIAL - Abnormal; Notable for the following components:   RBC Count, CSF 1 (*)    WBC, CSF 22 (*)    Lymphs, CSF 84 (*)    All other components within normal limits  PROTEIN AND GLUCOSE, CSF - Abnormal; Notable for the  following components:   Glucose, CSF 84 (*)    Total  Protein, CSF 86 (*)    All other components within normal limits  RESP PANEL BY RT-PCR (RSV, FLU A&B, COVID)  RVPGX2  MRSA NEXT GEN BY PCR, NASAL  CSF CULTURE W GRAM STAIN  LIPASE, BLOOD  TSH  MAGNESIUM  BRAIN NATRIURETIC PEPTIDE  AMMONIA  DIFFERENTIAL  CBC  BASIC METABOLIC PANEL WITH GFR  CYTOLOGY - NON PAP  SURGICAL PATHOLOGY  TROPONIN I (HIGH SENSITIVITY)  TROPONIN I (HIGH SENSITIVITY)   ____________________________________________  EKG   EKG Interpretation Date/Time:  Tuesday April 20 2023 13:55:23 EDT Ventricular Rate:  172 PR Interval:  82 QRS Duration:  91 QT Interval:  281 QTC Calculation: 476 R Axis:   74  Text Interpretation: A fib RVR Low voltage, precordial leads Repolarization abnormality, prob rate related Confirmed by Alona Bene [phone]) on 04/20/2023 2:05:44 PM       ____________________________________________   PROCEDURES  Procedure(s) performed:   Procedures  CRITICAL CARE Performed by: Maia Plan Total critical care time: 35 minutes Critical care time was exclusive of separately billable procedures and treating other patients. Critical care was necessary to treat or prevent imminent or life-threatening deterioration. Critical care was time spent personally by me on the following activities: development of treatment plan with patient and/or surrogate as well as nursing, discussions with consultants, evaluation of patient's response to treatment, examination of patient, obtaining history from patient or surrogate, ordering and performing treatments and interventions, ordering and review of laboratory studies, ordering and review of radiographic studies, pulse oximetry and re-evaluation of patient's condition.  Alona Bene, MD Emergency Medicine  ____________________________________________   INITIAL IMPRESSION / ASSESSMENT AND PLAN / ED COURSE  Pertinent labs & imaging results that  were available during my care of the patient were reviewed by me and considered in my medical decision making (see chart for details).   This patient is Presenting for Evaluation of weakness, which does require a range of treatment options, and is a complaint that involves a high risk of morbidity and mortality.  The Differential Diagnoses include AKI, electrolyte disturbance, dehydration, arrhythmia, sepsis, etc.  Critical Interventions-    Medications  diltiazem (CARDIZEM) 1 mg/mL load via infusion 15 mg (15 mg Intravenous Bolus from Bag 04/20/23  1447)    And  diltiazem (CARDIZEM) 125 mg in dextrose 5% 125 mL (1 mg/mL) infusion (0 mg/hr Intravenous Stopped 04/23/23 1401)  acetaminophen (TYLENOL) tablet 1,000 mg (1,000 mg Oral Given 04/21/23 2226)  pravastatin (PRAVACHOL) tablet 40 mg (40 mg Oral Given 04/23/23 2102)  buPROPion (WELLBUTRIN XL) 24 hr tablet 150 mg (150 mg Oral Given 04/23/23 0952)  levothyroxine (SYNTHROID) tablet 75 mcg (75 mcg Oral Given 04/23/23 0517)  polyethylene glycol (MIRALAX / GLYCOLAX) packet 17 g (17 g Oral Given 04/23/23 0952)  clopidogrel (PLAVIX) tablet 75 mg (75 mg Oral Given 04/23/23 1245)  dextromethorphan-guaiFENesin (MUCINEX DM) 30-600 MG per 12 hr tablet 1 tablet (has no administration in time range)  0.9 %  sodium chloride infusion ( Intravenous Stopped 04/21/23 2233)  Chlorhexidine Gluconate Cloth 2 % PADS 6 each (6 each Topical Given 04/23/23 2103)  sodium chloride flush (NS) 0.9 % injection 10-40 mL (10 mLs Intracatheter Given 04/23/23 2103)  enoxaparin (LOVENOX) injection 40 mg (40 mg Subcutaneous Given 04/23/23 2102)  nystatin (MYCOSTATIN/NYSTOP) topical powder ( Topical Given 04/23/23 2103)  metoprolol tartrate (LOPRESSOR) tablet 25 mg (25 mg Oral Given 04/23/23 2102)  senna-docusate (Senokot-S) tablet 1 tablet (1 tablet Oral Given 04/23/23 2102)  diltiazem (CARDIZEM CD) 24 hr capsule 300 mg (300 mg Oral Given 04/23/23 1244)  gadobutrol (GADAVIST) 1 MMOL/ML  injection 10 mL (has no administration in time range)  traZODone (DESYREL) tablet 50 mg (has no administration in time range)  sodium chloride 0.9 % bolus 500 mL (0 mLs Intravenous Stopped 04/20/23 1538)  potassium chloride SA (KLOR-CON M) CR tablet 40 mEq (40 mEq Oral Given 04/21/23 0608)  potassium chloride SA (KLOR-CON M) CR tablet 40 mEq (40 mEq Oral Given 04/21/23 0854)  LORazepam (ATIVAN) injection 1 mg (1 mg Intravenous Given 04/21/23 1334)  gadobutrol (GADAVIST) 1 MMOL/ML injection 10 mL (10 mLs Intravenous Contrast Given 04/21/23 1451)  sodium chloride 0.9 % bolus 250 mL ( Intravenous Infusion Verify 04/23/23 0443)  LORazepam (ATIVAN) injection 1 mg (1 mg Intravenous Given 04/23/23 1840)  LORazepam (ATIVAN) injection 1 mg (1 mg Intravenous Given 04/23/23 1947)    Reassessment after intervention:  HR improved.    I did obtain Additional Historical Information from husband at bedside.   I decided to review pertinent External Data, and in summary patient seen briefly at [facility] prior to arrival.   Clinical Laboratory Tests Ordered, included labs pending.   Radiologic Tests Ordered, included CXR. I independently interpreted the images and agree with radiology interpretation.   Cardiac Monitor Tracing which shows A fib RVR.   Social Determinants of Health Risk patient is a non-smoker.   Medical Decision Making: Summary:  The patient presents to the emergency department with generalized fatigue, weakness over the past 7 days.  Arrives in A-fib RVR with no hypotension.  Patient is asymptomatic with her A-fib and has a Watchman device.  Unclear if this is the primary reason for her fatigue so underlying metabolic or infectious issue. No focal neuro deficits. Plan to start IVF and diltiazem infusion with labs and CXR to follow. Per husband report, multiple failed cardioversion attempts in the past.   Reevaluation with update and discussion with labs pending. HR improved on diltiazem pending labs. Care  transferred to Dr. Karene Fry.   Patient's presentation is most consistent with acute presentation with potential threat to life or bodily function.   Disposition: pending   ____________________________________________  FINAL CLINICAL IMPRESSION(S) / ED DIAGNOSES  Final diagnoses:  Atrial fibrillation with  RVR (HCC)  AKI (acute kidney injury) (HCC)    Note:  This document was prepared using Dragon voice recognition software and may include unintentional dictation errors.  Alona Bene, MD, [facility] Emergency Medicine    Wilbert Hayashi, Arlyss Repress, MD 04/23/23 [phone]

## 2023-04-20 NOTE — ED Notes (Addendum)
 Pt. Family would like "behavioral health consult". States 'pt is not eating or drinking and they think this is why they have ended up here'.

## 2023-04-20 NOTE — ED Provider Notes (Cosign Needed)
 Behavioral Health Urgent Care Medical Screening Exam  Patient Name: [name] [name] MRN: [id number] Date of Evaluation: 04/20/23 Chief Complaint:  "Weak and not eating" Diagnosis:  Final diagnoses:  Hypotension, unspecified hypotension type  Transient alteration of awareness  Generalized weakness  Non-Hodgkin's lymphoma, unspecified body region, unspecified non-Hodgkin lymphoma type (HCC)  Atrial fibrillation, unspecified type (HCC)    History of Present illness: [name] [name] is a 78 y.o. female, history of atrial fibrillation, non-Hodgkin's lymphoma, chronic kidney disease stage III, chronic depression, generalized anxiety disorder, presents today accompanied by her husband who is concerned that patient has had a change in mental status, generalized weakness, increased sleeping and periods of lethargy over the last 2 weeks.  Has been reports that patient saw her primary care doctor a few weeks ago and multiple medications were adjusted.  Patient has over the last week had poor intake of fluids and food.  He has not had any associated nausea or vomiting.  She is not experiencing any pain associated with current symptoms. Her husband reports she did out to primary care doctor today who instructed them to come in to [facility] for evaluation.    Told patient and her husband that [facility] is not a medical facility it is a mental health facility with limited medical capabilities and given her current presentation and low blood pressure and 2-week history of significant changes in activity, decreased intake of food and visible generalized weakness and lethargy she would require an evaluation in the setting of the emergency department.  Advised that a TTS provider can evaluate patient in the ED once she is medically cleared. Flowsheet Row ED from 04/20/2023 in [facility] Admission (Discharged) from 02/04/2023 in [facility]GRESSIVE CARE Admission (Discharged) from 02/06/2022 in  [facility] ENDOSCOPY  C-SSRS RISK CATEGORY No Risk No Risk No Risk       Psychiatric Specialty Exam  Presentation  General Appearance:Well Groomed  Eye Contact:Minimal  Speech:Slow  Speech Volume:Decreased  Handedness:-- (not assessed)   Mood and Affect  Mood: Dysphoric  Affect: Flat   Thought Process  Thought Processes: Coherent  Descriptions of Associations:Intact (Minimal interaction with provider, patient is falling asleep during evaluation)  Orientation:Partial (could not recall year)  Thought Content:WDL    Hallucinations:None  Ideas of Reference:None  Suicidal Thoughts:No  Homicidal Thoughts:No   Sensorium  Memory: Immediate Poor  Judgment: Fair  Insight: Other (comment) (difficult to assess due to medical condition)   Executive Functions  Concentration: Poor  Attention Span: Poor  Recall: Poor  Fund of Knowledge: Fair  Language: Fair   Psychomotor Activity  Psychomotor Activity: -- (generalized weakness)   Assets  Assets: Desire for Improvement   Sleep  Sleep: Poor  Number of hours:  0 (Sleeping mostly all day and all night per family)   Physical Exam: Physical Exam Constitutional:      Appearance: She is ill-appearing and toxic-appearing.  HENT:     Head: Normocephalic and atraumatic.  Eyes:     Extraocular Movements: Extraocular movements intact.     Pupils: Pupils are equal, round, and reactive to light.  Cardiovascular:     Rate and Rhythm: Normal rate and regular rhythm.  Pulmonary:     Effort: Pulmonary effort is normal.  Neurological:     Mental Status: She is oriented to person, place, and time. She is lethargic.     Motor: Weakness present.     Gait: Gait abnormal.  Psychiatric:  Behavior: Behavior is cooperative.      Review of Systems  Constitutional:  Positive for malaise/fatigue.  Neurological:  Positive for weakness.    Blood pressure 98/68, pulse 80,  temperature 97.8 F (36.6 C), resp. rate 16, SpO2 98%. There is no height or weight on file to calculate BMI.  Musculoskeletal: Strength & Muscle Tone: within normal limits Gait & Station: normal Patient leans: N/A   [facility] MSE Discharge Disposition for Follow up and Recommendations: Based on my evaluation the patient appears to have an emergency medical condition for which I recommend the patient be transferred to the emergency department for further evaluation.   Accepting provider at [facility], ED Dr. Jaymes Graff, NP 04/20/2023, 1:01 PM

## 2023-04-20 NOTE — Progress Notes (Signed)
 ON-CALL CARDIOLOGY 04/20/23  Patient's name: [name] [name].   MRN: [id number].    DOB: [birth year] Primary care provider: Laurann Montana, MD. Primary cardiologist: Dr. Anne Fu  Interaction regarding this patient's care today: ED physician Dr. Karene Fry reached out to discuss her care.   Patient present w/ Afib w/ RVR admitted to medicine.   Hemodynamically stable.   Started on Cardizem gtt - HR improving.   Requesting consult in AM.   Impression: Afib RVR  S/p Watchman (follow CT documented seal) AKI   Recommendations: LVEF is preserved. Responding to Cardizem gtt.  Monitor over night.  Cards consults in  morning. Reached out in the intermin if needed - check AMION.   No charge.   Tessa Lerner, DO, [facility]  [facility]  [address] [place], [place] [address] 04/20/2023 5:22 PM

## 2023-04-20 NOTE — H&P (Addendum)
 History and Physical    CLISTA RAINFORD WUJ:[id number] DOB: Feb 11, 1945 DOA: 04/20/2023  PCP: Laurann Montana, MD   Patient coming from: Home    Chief Complaint: weakness,fatigue  HPI: [name] [name] is a 78 y.o. female with medical history significant of permanent A-fib status post Watchman implant currently not on anticoagulation, status post cardioversion in the past, non-Hodgkin's lymphoma, hypothyroidism, hyperlipidemia, anxiety, CKD stage IIIa, HFpEF, hypertension who presented from home with complaint of weakness, fatigue.  It had been going on for last week.  Patient states she does not have any appetite.  Patient was also seen at outpatient psychiatry center for the evaluation of her anxiety, depression but was referred to the emergency department from them.  As per the report, patient has been in and out of A-fib for the last few days.  Patient had watchman implant about 2 months ago.  Has history of several cardioversions and follows with cardiology.  Reported very poor oral intake recently. On presentation, she was in rapid A-fib with heart rate in the range of 130s.  Blood pressure was stable throughout. Patient seen and examined at bedside in the emergency department.  No report of fever, chills, chest pain, abdomen pain, nausea, vomiting, hematochezia , melena or dysuria.  Her heart rate was in the range of 140s.  She was completely symptomatic.  She does not complain of any chest discomfort.  She says her heart rate stays like that for last few days. Patient found to be slightly confused, was not able to tell me the day or month but told the year accurately.  Husband says she has been intermittently confused for last few days that is why he took her to see the psychiatrist.  ED Course: In A-fib with RVR on presentation.  Started on Cardizem drip.  Lab work showed creatinine of 1.9.  Appears slightly dehydrated on lamination.  Started on IV fluid.  Cardiology consulted.  Review of Systems:  As per HPI otherwise 10 point review of systems negative.    Past Medical History:  Diagnosis Date   (HFpEF) heart failure with preserved ejection fraction (HCC) 07/06/2016   A-fib (HCC)    paroxysmal   Acquired thrombophilia (HCC) 09/03/2021   Acute bronchitis 06/21/2020   Anemia 08/02/2020   Hgb 13.7-> 10.7 between 10/2019 and 07/2020   Anxiety    Arthritis    Benign neoplasm of colon 12/29/2011   Bilateral carpal tunnel syndrome 11/06/2015   Cataracts, bilateral    immature   Cervical spondylosis without myelopathy 11/06/2015   Chest pain 07/06/2016   Chronic cough    Chronic kidney disease, stage 3 unspecified (HCC) 03/26/2021   Chronic rhinitis 06/16/2017   CKD (chronic kidney disease) stage 3, GFR 30-59 ml/min (HCC)    Depression    Disequilibrium 08/01/2019   Drug-induced hypothyroidism 03/26/2021   Dyspnea on exertion 04/03/2016   PFT 08/02/20-mild obstruction, no resp to BD, mild reduction DLCO   Essential hypertension    Fatigue 07/06/2016   Gait instability 08/01/2019   Gastroesophageal reflux disease without esophagitis    Generalized anxiety disorder 11/25/2016   Generalized weakness 08/28/2021   Hardening of the aorta (main artery of the heart) (HCC) 03/26/2021   Hemorrhoid    History of blood transfusion 08/30/2021   while undergoing treatment for her lymphoma   History of bronchitis    couple of yrs ago   History of peristent atrial fibrillation    Hoarseness 05/12/2017   Hx of colonic polyps 05/24/2014  Hyperlipidemia    takes Pravastatin daily   Hypothyroidism 04/04/2018   Long term current use of amiodarone 10/19/2017   Loss of appetite 09/03/2021   Mitral regurgitation 02/27/2016   Moderate recurrent major depression (HCC) 09/03/2021   NHL (non-Hodgkin's lymphoma) (HCC) 09/03/2021   OSA (obstructive sleep apnea) 10/19/2017   HST 09/24/2018- AHI 30.5/ hr, desaturation to 76%, body weight 249.6 lbs Unable to tolerate CPAP, failed twice.   Other  specified disorders of bone density and structure, other site 09/03/2021   PAF (paroxysmal atrial fibrillation) (HCC) 02/17/2016   Pancytopenia (HCC) 08/29/2021   Persistent atrial fibrillation (HCC) 10/31/2016   Prediabetes 09/03/2021   Presence of Watchman left atrial appendage closure device 02/04/2023   27mm Watchman FLX Pro device placed by Dr. Lalla Brothers   Primary localized osteoarthritis of left knee 09/21/2016   Primary osteoarthritis of both first carpometacarpal joints 11/06/2015   Pruritus 10/31/2016   Rectocele 09/03/2021   Rhinitis, allergic 03/19/2017   S/P lumbar laminectomy 04/05/2013   Secondary hypercoagulable state (HCC) 04/28/2021   Sensorineural hearing loss (SNHL) of both ears 08/01/2019   Severe recurrent major depression without psychotic features (HCC) 11/25/2016   Splenomegaly 08/20/2021   Syncope 09/03/2021   Trigger finger 12/31/2022   middle finger on left and right hand   Upper airway cough syndrome 03/19/2017   Urinary urgency     Past Surgical History:  Procedure Laterality Date   ABDOMINAL HYSTERECTOMY  1980   partial   BACK SURGERY     CARDIOVERSION N/A 02/24/2016   Procedure: CARDIOVERSION;  Surgeon: Pricilla Riffle, MD;  Location: [facility] ENDOSCOPY;  Service: Cardiovascular;  Laterality: N/A;   CARDIOVERSION N/A 03/19/2016   Procedure: CARDIOVERSION;  Surgeon: Pricilla Riffle, MD;  Location: [facility] ENDOSCOPY;  Service: Cardiovascular;  Laterality: N/A;   CARDIOVERSION N/A 05/15/2021   Procedure: CARDIOVERSION;  Surgeon: Chrystie Nose, MD;  Location: [facility] ENDOSCOPY;  Service: Cardiovascular;  Laterality: N/A;   CARDIOVERSION N/A 02/06/2022   Procedure: CARDIOVERSION;  Surgeon: Maisie Fus, MD;  Location: [facility] ENDOSCOPY;  Service: Cardiovascular;  Laterality: N/A;   CARPAL TUNNEL RELEASE     CHOLECYSTECTOMY  1975   COLONOSCOPY WITH PROPOFOL  12/29/2011   Procedure: COLONOSCOPY WITH PROPOFOL;  Surgeon: Shirley Friar, MD;  Location: [facility] ENDOSCOPY;   Service: Endoscopy;  Laterality: N/A;   COLONOSCOPY WITH PROPOFOL N/A 05/24/2014   Procedure: COLONOSCOPY WITH PROPOFOL;  Surgeon: Charlott Rakes, MD;  Location: [facility] ENDOSCOPY;  Service: Endoscopy;  Laterality: N/A;   ESOPHAGEAL MANOMETRY N/A 02/22/2017   Procedure: ESOPHAGEAL MANOMETRY (EM);  Surgeon: Charlott Rakes, MD;  Location: [facility] ENDOSCOPY;  Service: Endoscopy;  Laterality: N/A;   ESOPHAGOGASTRODUODENOSCOPY     FOOT SURGERY     left, right   FRACTURE SURGERY     left leg-knee   HEEL SPUR EXCISION Bilateral    HOT HEMOSTASIS  12/29/2011   Procedure: HOT HEMOSTASIS (ARGON PLASMA COAGULATION/BICAP);  Surgeon: Shirley Friar, MD;  Location: [facility] ENDOSCOPY;  Service: Endoscopy;  Laterality: N/A;   HOT HEMOSTASIS N/A 05/24/2014   Procedure: HOT HEMOSTASIS (ARGON PLASMA COAGULATION/BICAP);  Surgeon: Charlott Rakes, MD;  Location: [facility] ENDOSCOPY;  Service: Endoscopy;  Laterality: N/A;   IR BONE MARROW BIOPSY & ASPIRATION  08/13/2021   IR IMAGING GUIDED PORT INSERTION  09/09/2021   LEFT ATRIAL APPENDAGE OCCLUSION N/A 02/04/2023   Procedure: LEFT ATRIAL APPENDAGE OCCLUSION;  Surgeon: Lanier Prude, MD;  Location: [facility] INVASIVE CV LAB;  Service: Cardiovascular;  Laterality: N/A;  leg surgery d/t break Left    LUMBAR LAMINECTOMY/DECOMPRESSION MICRODISCECTOMY Left 04/05/2013   Procedure: LUMBAR FOUR TO FIVE LUMBAR LAMINECTOMY/DECOMPRESSION MICRODISCECTOMY 1 LEVEL;  Surgeon: Tia Alert, MD;  Location: [facility] NEURO ORS;  Service: Neurosurgery;  Laterality: Left;   LYMPH NODE BIOPSY Left 08/30/2021   Procedure: LYMPH NODE BIOPSY;  Surgeon: Griselda Miner, MD;  Location: [facility] ORS;  Service: General;  Laterality: Left;   NISSEN FUNDOPLICATION     PH IMPEDANCE STUDY N/A 02/22/2017   Procedure: PH IMPEDANCE STUDY;  Surgeon: Charlott Rakes, MD;  Location: [facility] ENDOSCOPY;  Service: Endoscopy;  Laterality: N/A;   TEE WITHOUT CARDIOVERSION N/A 02/24/2016   Procedure: TRANSESOPHAGEAL ECHOCARDIOGRAM  (TEE);  Surgeon: Pricilla Riffle, MD;  Location: [facility] ENDOSCOPY;  Service: Cardiovascular;  Laterality: N/A;   TOTAL KNEE ARTHROPLASTY Left 10/07/2016   Procedure: TOTAL KNEE ARTHROPLASTY;  Surgeon: Loreta Ave, MD;  Location: [facility] OR;  Service: Orthopedics;  Laterality: Left;   TRANSESOPHAGEAL ECHOCARDIOGRAM (CATH LAB) N/A 02/04/2023   Procedure: TRANSESOPHAGEAL ECHOCARDIOGRAM;  Surgeon: Lanier Prude, MD;  Location: [facility] INVASIVE CV LAB;  Service: Cardiovascular;  Laterality: N/A;     reports that she has never smoked. She has never used smokeless tobacco. She reports that she does not drink alcohol and does not use drugs.  Allergies  Allergen Reactions   Omnicef [Cefdinir] Swelling    Tongue swelling   Rituxan [Rituximab] Shortness Of Breath    Patient given Pepcid 20mg  IV with fluids patinet reported feeling better. Treatment was restarted patient was able to tolerate the rest of the treatment with no issues.    Bacitracin     Rash   Bacitracin-Polymyxin B     Rash   Duloxetine Other (See Comments)    felt funny- thinking messed up   Duloxetine Hcl     felt funny- thinking messed up   Lexapro [Escitalopram]     Makes her feel funny and sleepy   Mirtazapine     weight gain in high doses    Penicillins Swelling    Tongue swell   Peroxide [Hydrogen Peroxide] Other (See Comments)    Redness.    Zoloft [Sertraline Hcl]     Makes her feel funny   Neosporin [Neomycin-Bacitracin Zn-Polymyx] Rash    Blisters, itching.     Family History  Problem Relation Age of Onset   Colon cancer Mother    Heart disease Father    Breast cancer Neg Hx    Thyroid disease Neg Hx      Prior to Admission medications   Medication Sig Start Date End Date Taking? Authorizing Provider  acetaminophen (TYLENOL) 500 MG tablet Take 1,000 mg by mouth every 6 (six) hours as needed for moderate pain.    [provider]  albuterol (VENTOLIN HFA) 108 (90 Base) MCG/ACT inhaler Inhale 1-2 puffs  into the lungs every 4 (four) hours as needed for wheezing or shortness of breath. 07/22/18   Parrett, Virgel Bouquet, NP  ALPRAZolam (XANAX) 0.5 MG tablet Take 1 tablet (0.5 mg total) by mouth 3 (three) times daily as needed for anxiety. 03/11/21   Rana Snare, NP  benzonatate (TESSALON) 200 MG capsule Take 1 capsule by mouth three times daily as needed for cough 05/22/19   Parrett, Tammy S, NP  buPROPion (WELLBUTRIN XL) 150 MG 24 hr tablet Take 150 mg by mouth daily. 05/13/22   [provider]  Calcium 250 MG CAPS Take 250 mg by mouth daily.  [provider]  clopidogrel (PLAVIX) 75 MG tablet Take 1 tablet (75 mg total) by mouth daily. 04/12/23   Lanier Prude, MD  dextromethorphan-guaiFENesin [facility] DM) 30-600 MG 12hr tablet Take 1 tablet by mouth daily as needed for cough.    [provider]  diltiazem (CARDIZEM CD) 300 MG 24 hr capsule Take 1 capsule (300 mg total) by mouth daily. 03/09/23 06/07/23  Fenton, Clint R, PA  diltiazem (CARDIZEM) 30 MG tablet Take 1 tablet by mouth once if heart rate greater than 100bpm 11/23/22   Asa Lente, Tessa N, PA-C  furosemide (LASIX) 20 MG tablet Take 1 tablet (20 mg total) by mouth daily as needed for edema or fluid. 09/01/21   Leatha Gilding, MD  levothyroxine (SYNTHROID) 75 MCG tablet TAKE 1 TABLET DAILY BEFORE BREAKFAST (NEED APPOINTMENT FOR FURTHER REFILLS) 09/01/21   Shamleffer, Konrad Dolores, MD  lidocaine-prilocaine (EMLA) cream Apply to Port-A-Cath site 1 to 2 hours prior to use 10/20/21   Rana Snare, NP  Magnesium 250 MG TABS Take 250 mg by mouth at bedtime.    [provider]  polyethylene glycol (MIRALAX) 17 g packet Take 17 g by mouth daily. Patient taking differently: Take 17 g by mouth daily as needed for mild constipation or moderate constipation. 09/29/21   Ladene Artist, MD  potassium chloride SA (KLOR-CON M) 20 MEQ tablet Take 1 tablet (20 mEq total) by mouth daily as needed (Take potassium only when you  take the Furosemide (Lasix)). 09/01/21   Leatha Gilding, MD  pravastatin (PRAVACHOL) 40 MG tablet Take 40 mg by mouth at bedtime.    [provider]  Spacer/Aero-Holding Chambers (AEROCHAMBER MV) inhaler Use as instructed 10/13/18   Jetty Duhamel D, MD    Physical Exam: Vitals:   04/20/23 1356 04/20/23 1500 04/20/23 1540  BP: (!) 141/71 128/70 120/63  Pulse: (!) 172 (!) 109 (!) 135  Resp: (!) 26 20 20   Temp: 97.9 F (36.6 C)    SpO2: 96% 94% 95%    Constitutional: Very pleasant female, obese Vitals:   04/20/23 1356 04/20/23 1500 04/20/23 1540  BP: (!) 141/71 128/70 120/63  Pulse: (!) 172 (!) 109 (!) 135  Resp: (!) 26 20 20   Temp: 97.9 F (36.6 C)    SpO2: 96% 94% 95%   Eyes: PERRL, lids and conjunctivae normal ENMT: Mucous membranes are moist.  Neck: normal, supple, no masses, no thyromegaly Respiratory: clear to auscultation bilaterally, no wheezing, no crackles. Normal respiratory effort. No accessory muscle use.  Cardiovascular: Irregularly irregular rhythm with rapid ventricular response, no murmurs / rubs / gallops. No extremity edema.  Abdomen: no tenderness, no masses palpated. No hepatosplenomegaly. Bowel sounds positive.  Musculoskeletal: no clubbing / cyanosis. No joint deformity upper and lower extremities.  Skin: no rashes, lesions, ulcers. No induration Neurologic: CN 2-12 grossly intact.  Strength 5/5 in all 4.  Psychiatric: Normal judgment and insight. Alert and oriented x 3. Normal mood.   Foley Catheter:None  Labs on Admission: I have personally reviewed following labs and imaging studies  CBC: Recent Labs  Lab 04/20/23 1422  WBC 5.1  NEUTROABS 2.9  HGB 15.5*  HCT 45.8  MCV 99.8  PLT 76*   Basic Metabolic Panel: Recent Labs  Lab 04/20/23 1422  NA 134*  K 4.1  CL 97*  CO2 24  GLUCOSE 135*  BUN 31*  CREATININE 1.94*  CALCIUM 9.6  MG 2.1   GFR: CrCl cannot be calculated (Unknown ideal weight.).  Liver Function Tests: Recent  Labs  Lab 04/20/23 1422  AST 24  ALT 15  ALKPHOS 85  BILITOT 1.4*  PROT 6.8  ALBUMIN 3.9   Recent Labs  Lab 04/20/23 1422  LIPASE 25   No results for input(s): "AMMONIA" in the last 168 hours. Coagulation Profile: No results for input(s): "INR", "PROTIME" in the last 168 hours. Cardiac Enzymes: No results for input(s): "CKTOTAL", "CKMB", "CKMBINDEX", "TROPONINI" in the last 168 hours. BNP (last 3 results) No results for input(s): "PROBNP" in the last 8760 hours. HbA1C: No results for input(s): "HGBA1C" in the last 72 hours. CBG: No results for input(s): "GLUCAP" in the last 168 hours. Lipid Profile: No results for input(s): "CHOL", "HDL", "LDLCALC", "TRIG", "CHOLHDL", "LDLDIRECT" in the last 72 hours. Thyroid Function Tests: Recent Labs    04/20/23 1422  TSH 0.818   Anemia Panel: No results for input(s): "VITAMINB12", "FOLATE", "FERRITIN", "TIBC", "IRON", "RETICCTPCT" in the last 72 hours. Urine analysis:    Component Value Date/Time   COLORURINE STRAW (A) 09/03/2021 1225   APPEARANCEUR CLEAR 09/03/2021 1225   LABSPEC 1.008 09/03/2021 1225   PHURINE 5.0 09/03/2021 1225   GLUCOSEU NEGATIVE 09/03/2021 1225   HGBUR NEGATIVE 09/03/2021 1225   BILIRUBINUR NEGATIVE 09/03/2021 1225   KETONESUR NEGATIVE 09/03/2021 1225   PROTEINUR NEGATIVE 09/03/2021 1225   NITRITE NEGATIVE 09/03/2021 1225   LEUKOCYTESUR NEGATIVE 09/03/2021 1225    Radiological Exams on Admission: DG Chest Portable 1 View Result Date: 04/20/2023 CLINICAL DATA:  Shortness of breath.  Fatigue. EXAM: PORTABLE CHEST 1 VIEW COMPARISON:  Radiograph 02/04/2023 FINDINGS: Right chest port in place. Mild cardiomegaly, stable. Mediastinal contours are unchanged. Subsegmental atelectasis or scarring at the left lung base. No acute airspace disease, pleural effusion, pulmonary edema or pneumothorax. IMPRESSION: Mild chronic cardiomegaly. Subsegmental atelectasis or scarring at the left lung base. Electronically Signed    By: Narda Rutherford M.D.   On: 04/20/2023 16:14     Assessment/Plan Principal Problem:   Chronic a-fib (HCC) Active Problems:   Generalized anxiety disorder   Hyperlipidemia   Essential hypertension   (HFpEF) heart failure with preserved ejection fraction (HCC)   Hypothyroidism   Chronic kidney disease, stage 3 unspecified (HCC)   AKI (acute kidney injury) (HCC)  A-fib with RVR: History of chronic A-fib.  CHADS2VASC score of 6. Status post Watchman implant about 3 months ago.  Status post several cardioversions, the last one on 05/15/2021.  Follows with cardiology.  Heart rate in the range of 130-140 on presentation.  Started on Cardizem drip.  Cardiology will be consulted.  Currently not on anticoagulation.  Takes oral Cardizem at home.  Currently taking Plavix  AKI in CKD stage IIIa: Baseline creatinine around 1.5.  Her last creatinine was 1.1.  Presented with creatinine in the range of 1.9.  Likely associated with with poor oral intake.  Will start on gentle IV fluid.  History of non-Hodgkin's lymphoma: Follows with Dr. Myrle Sheng.  Status posttreatment with chemotherapy and Rituxan.  Has chronic thrombocytopenia  Hypertension: Continue current medication.  Monitor blood pressure  History of OSA: Currently not on CPAP  History of anxiety/depression: On Xanax, Wellbutrin  Chronic HFpEF: Last echo showed EF of 60 to 65%.  Currently might be dehydrated.  Will start on IV fluid.  Takes Lasix 20 mg as needed at home  Hypothyroidism: Continue Synthyroid  History of hyperlipidemia: On pravastatin  Confusion: New problem as per the husband.  She was overall coherent and followed commands pretty well in  the emergency department.  No focal deficits.  Will hold Xanax.  Will get CT of the head, UA and ammonia level.  Respiratory status is stable  Generalized weakness: Will consult PT/OT when appropriate  Severity of Illness: The appropriate patient status for this patient is INPATIENT.    DVT prophylaxis: Lovenox Code Status: Full code Family Communication: called and discussed with husband Jillyn Hidden on phone Consults called: Cardiology     Burnadette Pop MD [facility]  04/20/2023, 4:57 PM

## 2023-04-20 NOTE — ED Provider Notes (Addendum)
  Physical Exam  BP (!) 141/71   Pulse (!) 172   Temp 97.9 F (36.6 C)   Resp (!) 26   SpO2 96%   Physical Exam  Procedures  .Critical Care  Performed by: Ernie Avena, MD Authorized by: Ernie Avena, MD   Critical care provider statement:    Critical care time (minutes):  30   Critical care was necessary to treat or prevent imminent or life-threatening deterioration of the following conditions:  Circulatory failure   Critical care was time spent personally by me on the following activities:  Development of treatment plan with patient or surrogate, discussions with consultants, evaluation of patient's response to treatment, examination of patient, ordering and review of laboratory studies, ordering and review of radiographic studies, ordering and performing treatments and interventions, pulse oximetry, re-evaluation of patient's condition and review of old charts   Care discussed with: admitting provider     ED Course / MDM    Medical Decision Making Amount and/or Complexity of Data Reviewed Labs: ordered. Radiology: ordered.  Risk Prescription drug management. Decision regarding hospitalization.   6F, hx of afib and watchman procedure, not on AC, hx of unsuccessful cardioversions, here in afib with RVR. HR 170s on arrival, on Dilt gtt, pending likely admission pending labs and CXR.     Laboratory evaluation with an AKI on CKD with a serum creatinine of 1.94 from a baseline of 1.1-1.2.  The patient was continued on diltiazem drip, continued with atrial fibrillation with RVR.  Status post watchman, not on anticoagulation.  Initial troponin 14, repeat pending, COVID, flu, RSV PCR testing negative, CBC with a WBC 5.1, evidence of hemoconcentration with a hemoglobin of 15.5.  The patient was status post 500 cc NaCl bolus.  Has had multiple failed cardioversions in the past.  Plan for admission for observation in the setting of the patient's AKI and uncontrolled atrial  fibrillation. BNP pending, will hold on further fluid resuscitation untilk BNP results. Hospitalist medicine consulted for admission, Dr. Renford Dills accepting. He requests I place a cardiology consult, which was done. Spoke with Dr. Odis Hollingshead who will see the patient in consultation.     Ernie Avena, MD 04/20/23 1654    Ernie Avena, MD 04/20/23 [phone]

## 2023-04-20 NOTE — ED Notes (Signed)
 Pt left with EMS. All paperwork given to ems no further concerns.

## 2023-04-20 NOTE — ED Triage Notes (Signed)
 Pt arrived with husband. Reports has been extremely fatigue and shob x1 week. Hx of Afib. HR 150-180 in triage. MD long made aware. EKG complete. Patient denies cp

## 2023-04-21 ENCOUNTER — Inpatient Hospital Stay (HOSPITAL_COMMUNITY)

## 2023-04-21 ENCOUNTER — Encounter (HOSPITAL_COMMUNITY): Payer: Self-pay | Admitting: Internal Medicine

## 2023-04-21 DIAGNOSIS — I4891 Unspecified atrial fibrillation: Secondary | ICD-10-CM

## 2023-04-21 DIAGNOSIS — I482 Chronic atrial fibrillation, unspecified: Secondary | ICD-10-CM | POA: Diagnosis not present

## 2023-04-21 DIAGNOSIS — N179 Acute kidney failure, unspecified: Secondary | ICD-10-CM | POA: Diagnosis not present

## 2023-04-21 DIAGNOSIS — Z95818 Presence of other cardiac implants and grafts: Secondary | ICD-10-CM

## 2023-04-21 DIAGNOSIS — I10 Essential (primary) hypertension: Secondary | ICD-10-CM | POA: Diagnosis not present

## 2023-04-21 DIAGNOSIS — G9389 Other specified disorders of brain: Secondary | ICD-10-CM

## 2023-04-21 LAB — URINALYSIS, ROUTINE W REFLEX MICROSCOPIC
Bilirubin Urine: NEGATIVE
Glucose, UA: NEGATIVE mg/dL
Hgb urine dipstick: NEGATIVE
Ketones, ur: NEGATIVE mg/dL
Nitrite: NEGATIVE
Protein, ur: NEGATIVE mg/dL
Specific Gravity, Urine: 1.025 (ref 1.005–1.030)
pH: 5 (ref 5.0–8.0)

## 2023-04-21 LAB — DIFFERENTIAL
Abs Immature Granulocytes: 0.02 10*3/uL (ref 0.00–0.07)
Basophils Absolute: 0 10*3/uL (ref 0.0–0.1)
Basophils Relative: 1 %
Eosinophils Absolute: 0.1 10*3/uL (ref 0.0–0.5)
Eosinophils Relative: 2 %
Immature Granulocytes: 1 %
Lymphocytes Relative: 28 %
Lymphs Abs: 1 10*3/uL (ref 0.7–4.0)
Monocytes Absolute: 0.7 10*3/uL (ref 0.1–1.0)
Monocytes Relative: 20 %
Neutro Abs: 1.7 10*3/uL (ref 1.7–7.7)
Neutrophils Relative %: 48 %

## 2023-04-21 LAB — BASIC METABOLIC PANEL WITH GFR
Anion gap: 9 (ref 5–15)
BUN: 29 mg/dL — ABNORMAL HIGH (ref 8–23)
CO2: 23 mmol/L (ref 22–32)
Calcium: 8.4 mg/dL — ABNORMAL LOW (ref 8.9–10.3)
Chloride: 97 mmol/L — ABNORMAL LOW (ref 98–111)
Creatinine, Ser: 1.56 mg/dL — ABNORMAL HIGH (ref 0.44–1.00)
GFR, Estimated: 34 mL/min — ABNORMAL LOW (ref >60)
Glucose, Bld: 127 mg/dL — ABNORMAL HIGH (ref 70–99)
Potassium: 3.2 mmol/L — ABNORMAL LOW (ref 3.5–5.1)
Sodium: 129 mmol/L — ABNORMAL LOW (ref 135–145)

## 2023-04-21 LAB — CBC
HCT: 37.8 % (ref 36.0–46.0)
Hemoglobin: 12.4 g/dL (ref 12.0–15.0)
MCH: 33 pg (ref 26.0–34.0)
MCHC: 32.8 g/dL (ref 30.0–36.0)
MCV: 100.5 fL — ABNORMAL HIGH (ref 80.0–100.0)
Platelets: 63 10*3/uL — ABNORMAL LOW (ref 150–400)
RBC: 3.76 MIL/uL — ABNORMAL LOW (ref 3.87–5.11)
RDW: 14 % (ref 11.5–15.5)
WBC: 3.6 10*3/uL — ABNORMAL LOW (ref 4.0–10.5)
nRBC: 0 % (ref 0.0–0.2)

## 2023-04-21 LAB — LACTATE DEHYDROGENASE: LDH: 263 U/L — ABNORMAL HIGH (ref 98–192)

## 2023-04-21 MED ORDER — SODIUM CHLORIDE 0.9% FLUSH
10.0000 mL | Freq: Two times a day (BID) | INTRAVENOUS | Status: DC
  Administered 2023-04-21 – 2023-04-25 (×9): 10 mL

## 2023-04-21 MED ORDER — NYSTATIN 100000 UNIT/GM EX POWD
Freq: Three times a day (TID) | CUTANEOUS | Status: DC
  Filled 2023-04-21 (×2): qty 15

## 2023-04-21 MED ORDER — ENOXAPARIN SODIUM 40 MG/0.4ML IJ SOSY
40.0000 mg | PREFILLED_SYRINGE | INTRAMUSCULAR | Status: DC
  Administered 2023-04-21 – 2023-04-24 (×4): 40 mg via SUBCUTANEOUS
  Filled 2023-04-21 (×4): qty 0.4

## 2023-04-21 MED ORDER — POTASSIUM CHLORIDE CRYS ER 20 MEQ PO TBCR
40.0000 meq | EXTENDED_RELEASE_TABLET | Freq: Once | ORAL | Status: AC
  Administered 2023-04-21: 40 meq via ORAL
  Filled 2023-04-21: qty 2

## 2023-04-21 MED ORDER — LORAZEPAM 2 MG/ML IJ SOLN
1.0000 mg | Freq: Once | INTRAMUSCULAR | Status: AC
  Administered 2023-04-21: 1 mg via INTRAVENOUS
  Filled 2023-04-21: qty 1

## 2023-04-21 MED ORDER — FOSFOMYCIN TROMETHAMINE 3 G PO PACK
3.0000 g | PACK | ORAL | Status: DC
  Administered 2023-04-21: 3 g via ORAL
  Filled 2023-04-21: qty 3

## 2023-04-21 MED ORDER — SODIUM CHLORIDE 0.9 % IV SOLN: 1.0000 g | INTRAVENOUS | Status: DC

## 2023-04-21 MED ORDER — GADOBUTROL 1 MMOL/ML IV SOLN
10.0000 mL | Freq: Once | INTRAVENOUS | Status: AC | PRN
  Administered 2023-04-21: 10 mL via INTRAVENOUS

## 2023-04-21 NOTE — Plan of Care (Signed)
  Problem: Activity: Goal: Risk for activity intolerance will decrease Outcome: Progressing   Problem: Nutrition: Goal: Adequate nutrition will be maintained Outcome: Progressing   Problem: Clinical Measurements: Goal: Will remain free from infection Outcome: Not Progressing Goal: Diagnostic test results will improve Outcome: Not Progressing

## 2023-04-21 NOTE — Plan of Care (Signed)
  Problem: Education: Goal: Knowledge of General Education information will improve Description: Including pain rating scale, medication(s)/side effects and non-pharmacologic comfort measures Outcome: Progressing   Problem: Nutrition: Goal: Adequate nutrition will be maintained Outcome: Progressing   Problem: Pain Managment: Goal: General experience of comfort will improve and/or be controlled Outcome: Progressing

## 2023-04-21 NOTE — Progress Notes (Addendum)
 [name] [name]   DOB:[birth year]   ZD#:[id number]      ASSESSMENT & PLAN:  Non-Hodgkin's lymphoma Large B cell - In remission -Diagnosed 08/2020.  Status post treatment with R-CHOP, Udenyca in 2023. - CT head done 04/20/2023 shows 2.7 hyperdense intra axial mass left aspect of splenium.  Finding is nonspecific but concerning for neoplastic process given history of lymphoma. -MRI brain pending for further eval - Medical oncology/Dr. Truett Perna following  Fatigue/generalized Weakness Confusion - Patient with multiple comorbidities.  Admitted for 09/01/2023 with complaints of generalized weakness and fatigue.  Family states patient has been intermittently confused for several days. - Patient reports feeling a little better.  Thrombocytopenia - Chronic, baseline in 100 range - Etiology unclear - Platelets low 63K today. - Transfuse platelets for counts <20 K or <50 K with active bleeding.  No transfusional intervention required at this time. - Continue to monitor CBC with differential  History of coagulopathy - Was on apixaban  Chronic A-fib -Status post recent watchman implant 03/2023 -Critical care and cardiology teams following  Hypertension Hyperlipidemia -Continue to monitor blood pressure and administer antihypertensives - On statin - Monitor labs  CKD - Elevated creatinine and BUN - Continue gentle hydration - Monitor CMP   Code Status Full  Subjective:  Patient seen awake alert and oriented x 3 laying in bed in stepdown unit with monitors attached.  Reports that she feels "a little better".  Denies dizziness, lightheadedness, chest pain, bleeding, GI symptoms.  Patient's husband and son at bedside.  Objective:  Vitals:   04/21/23 0700 04/21/23 0800  BP: 116/71   Pulse: (!) 108   Resp: 17   Temp:  97.8 F (36.6 C)  SpO2: 98%      Intake/Output Summary (Last 24 hours) at 04/21/2023 0957 Last data filed at 04/21/2023 2595 Gross per 24 hour  Intake 969.06 ml  Output  --  Net 969.06 ml     REVIEW OF SYSTEMS:   Constitutional: Denies fevers, chills or abnormal night sweats Eyes: Denies blurriness of vision, double vision or watery eyes Ears, nose, mouth, throat, and face: Denies mucositis or sore throat Respiratory: Denies cough, dyspnea or wheezes Cardiovascular: Denies palpitation, chest discomfort or lower extremity swelling Gastrointestinal:  Denies nausea, heartburn or change in bowel habits Skin: Denies abnormal skin rashes Lymphatics: Denies new lymphadenopathy or easy bruising Neurological: Denies numbness, tingling or new weaknesses Behavioral/Psych: Mood is stable, no new changes  All other systems were reviewed with the patient and are negative.  PHYSICAL EXAMINATION: ECOG PERFORMANCE STATUS: 3 - Symptomatic, >50% confined to bed  Vitals:   04/21/23 0700 04/21/23 0800  BP: 116/71   Pulse: (!) 108   Resp: 17   Temp:  97.8 F (36.6 C)  SpO2: 98%    Filed Weights   04/20/23 2140  Weight: 219 lb 12.8 oz (99.7 kg)    GENERAL: alert, no distress and comfortable SKIN: + Pale skin color, texture, turgor are normal, no rashes or significant lesions EYES: normal, conjunctiva are pink and non-injected, sclera clear OROPHARYNX: no exudate, no erythema and lips, buccal mucosa, and tongue normal  NECK: supple, thyroid normal size, non-tender, without nodularity LYMPH: no palpable lymphadenopathy in the cervical, axillary or inguinal LUNGS: clear to auscultation and percussion with normal breathing effort HEART: regular rate & rhythm and no murmurs and no lower extremity edema ABDOMEN: abdomen soft, non-tender and normal bowel sounds, no hepatosplenomegaly MUSCULOSKELETAL: no cyanosis of digits and no clubbing  PSYCH: alert & oriented  x 3 with fluent speech NEURO: no focal motor/sensory deficits, alert, follows commands, not oriented today or year   All questions were answered. The patient knows to call the clinic with any problems,  questions or concerns.   The total time spent in the appointment was 40 minutes encounter with patient including review of chart and various tests results, discussions about plan of care and coordination of care plan  Dawson Bills, NP 04/21/2023 9:57 AM    Labs Reviewed:  Lab Results  Component Value Date   WBC 3.6 (L) 04/21/2023   HGB 12.4 04/21/2023   HCT 37.8 04/21/2023   MCV 100.5 (H) 04/21/2023   PLT 63 (L) 04/21/2023  Blood smear: 04/21/2023-the platelets are mildly decreased, scattered large platelets, no significant platelet clumps.  There are burr cells and a few ovalocytes.  The polychromasia is not increased.  The white cell morphology is unremarkable. Recent Labs    08/11/22 0848 01/11/23 0937 03/09/23 0909 04/20/23 1422 04/21/23 0248  NA 141 138 138 134* 129*  K 4.1 4.3 3.8 4.1 3.2*  CL 108 104 107 97* 97*  CO2 28 29 26 24 23   GLUCOSE 117* 123* 122* 135* 127*  BUN 15 23 9  31* 29*  CREATININE 1.16* 1.25* 1.11* 1.94* 1.56*  CALCIUM 9.0 9.1 8.8* 9.6 8.4*  GFRNONAA 49* 45* 52* 26* 34*  PROT 6.2* 6.2*  --  6.8  --   ALBUMIN 4.0 3.9  --  3.9  --   AST 16 13*  --  24  --   ALT 11 13  --  15  --   ALKPHOS 73 72  --  85  --   BILITOT 0.4 0.5  --  1.4*  --     Studies Reviewed:  CT HEAD WO CONTRAST ( ) Result Date: 04/21/2023 CLINICAL DATA:  Initial evaluation for delirium. EXAM: CT HEAD WITHOUT CONTRAST TECHNIQUE: Contiguous axial images were obtained from the base of the skull through the vertex without intravenous contrast. RADIATION DOSE REDUCTION: This exam was performed according to the departmental dose-optimization program which includes automated exposure control, adjustment of the mA and/or kV according to patient size and/or use of iterative reconstruction technique. COMPARISON:  None Available. FINDINGS: Brain: Cerebral volume within normal limits. No acute intracranial hemorrhage. No acute large vessel territory infarct. Well-circumscribed ovoid hyperdense  mildly expansile mass positioned at the left aspect of the splenium measures up to 2.7 cm (series 2, image 17). Mild surrounding edema without significant regional mass effect. This appears intra-axial in location. No other mass lesion, mass effect or midline shift. No hydrocephalus or extra-axial fluid collection. Vascular: No abnormal hyperdense vessel. Skull: Scalp soft tissues within normal limits.  Calvarium intact. Sinuses/Orbits: Globes orbital soft tissues within normal limits. Paranasal sinuses are clear. No mastoid effusion. Other: None. IMPRESSION: 1. 2.7 cm hyperdense intra-axial mass positioned at the left aspect of the splenium. Mild surrounding edema without significant regional mass effect. Finding is nonspecific, but concerning for a neoplastic process, with lymphoma a consideration given patient history as well as the periventricular location. Further evaluation with dedicated brain MRI, with and without contrast, recommended for further evaluation. 2. No other acute intracranial abnormality. Electronically Signed   By: Rise Mu M.D.   On: 04/21/2023 00:17   DG Chest Portable 1 View Result Date: 04/20/2023 CLINICAL DATA:  Shortness of breath.  Fatigue. EXAM: PORTABLE CHEST 1 VIEW COMPARISON:  Radiograph 02/04/2023 FINDINGS: Right chest port in place. Mild cardiomegaly, stable. Mediastinal contours  are unchanged. Subsegmental atelectasis or scarring at the left lung base. No acute airspace disease, pleural effusion, pulmonary edema or pneumothorax. IMPRESSION: Mild chronic cardiomegaly. Subsegmental atelectasis or scarring at the left lung base. Electronically Signed   By: Narda Rutherford M.D.   On: 04/20/2023 16:14   CT CARDIAC MORPH/PULM VEIN W/CM&W/O CA SCORE Addendum Date: 04/17/2023 ADDENDUM REPORT: 04/17/2023 02:57 EXAM: OVER-READ INTERPRETATION  CT CHEST The following report is an over-read performed by radiologist Dr. Alcide Clever of [facility] on 04/17/2023. This  over-read does not include interpretation of cardiac or coronary anatomy or pathology. The coronary calcium score/coronary CTA interpretation by the cardiologist is attached. COMPARISON:  None. FINDINGS: Cardiovascular: No significant atherosclerotic calcifications are noted. Left atrial appendage occlusion device is seen. Central catheter is noted as well. Mediastinum/Nodes: There are no enlarged lymph nodes within the visualized mediastinum. Lungs/Pleura: There is no pleural effusion. The visualized lungs appear clear. Upper abdomen: No significant findings in the visualized upper abdomen. Musculoskeletal/Chest wall: No chest wall mass or suspicious osseous findings within the visualized chest. IMPRESSION: No significant extracardiac findings within the visualized chest. Electronically Signed   By: Alcide Clever M.D.   On: 04/17/2023 02:57   Result Date: 04/17/2023 CLINICAL DATA:  Follow up for a Watchman Flex Device EXAM: Cardiac CT/CTA TECHNIQUE: The patient was scanned on a Siemens Somatom scanner. FINDINGS: A 120 kV prospective scan was triggered in the descending thoracic aorta at 111 HU's. Gantry rotation speed was 280 msecs and collimation was .9 mm. No beta blockade and no NTG was given. The 3D data set was reconstructed in 5% intervals of the 60-80 % of the R-R cycle. Systolic phases were analyzed on a dedicated work station using MPR, MIP and VRT modes. The patient received 95 cc of contrast. Device Position and stability Size: 27 mm Position: No evidence of device migration or embolization Average compression: 15 % Mitral shoulder: Present, less then 1/2 shoulder Occlusion Assessment Complete occlusion of the device Presence of contrast in the distal appendage to the device suggesting incomplete endothelialization Peridevice leak: Absent There is no evidence of device related thrombus. Interatrial septum: No communications. Non-appendage cardiac findings: Aorta:  Normal caliber.  No dissection.  No  calcification. Main pulmonary artery: Mild dilation 30 mm Aortic Valve:  Tri-leaflet. No calcification. Chamber dimensions: Severe right atrial dilation. Venous drainage: Normal pulmonary vein anatomy. Valve assessment: Mitral annular calcification noted. There is a catheter that terminates in the right atrium. Coronary Arteries: Normal coronary origin. The study was performed without use of NTG and insufficient for plaque evaluation. Coronary Calcium Score: 414; increased from 2023. Extra-cardiac findings: See attached radiology report for non-cardiac structures. IMPRESSION: 1. Successful left atrial appendage occlusion with complete seal. 2. Absence of peridevice leak. 3. Absence of device-related thrombus. 4. Stable device position without evidence of migration or embolization. Riley Lam, MD Electronically Signed: By: Riley Lam M.D. On: 04/07/2023 15:20   [name] [name] is well-known to me with a history of non-Hodgkin's lymphoma diagnosed in August 2023 and treated with R-CHOP ending in December 2023.  She entered clinical remission following chemotherapy.  She reports a 2-week history of anorexia and malaise.  She has been "depressed "and confused.  She developed a rash after being placed on an antidepressant 2 weeks ago.  She presented to the emergency room yesterday.  Labs were notable for an elevated creatinine and thrombocytopenia.      Large B-cell lymphoma presenting with macrocytic anemia/thrombocytopenia -08/13/2020 B12 3500 -08/13/2020  LDH 612 -08/13/2020 myeloma panel-no monoclonal protein  -Bone marrow biopsy 08/20/2020-hypercellular marrow with erythroid hyperplasia and dyspoiesis, rare lipogranuloma like lesions.  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  Negative myeloma FISH panel, 46XX; neotype myeloid disorders profile NGS sequencing-no pathogenic mutations detected in any of the genes on the NGS panel -PN[facility] screen - 08/26/2020 -CTs 02/17/2021-new  splenomegaly; diffuse bilateral bronchial wall thickening; clustered groundglass and fine nodular opacity in the dependent right lower lobe.  CAD. -Bone marrow biopsy 08/13/2021-hypercellular bone marrow for age with trilineage hematopoiesis; several atypical lymphoid aggregates present; no increase in blastic cells; flow cytometry without significant T or B-cell abnormalities; normal cytogenetics; "the limited morphologic and immunohistochemical features are atypical and a lymphoproliferative process is not excluded" -PET scan 08/27/2021-widespread hypermetabolic lymphadenopathy in the neck, left axilla, mediastinum, retroperitoneum, and left pelvis.  Hypermetabolic skeletal lesions, generalized bilateral pulmonary hypermetabolism -08/30/2021-1 unit RBCs -Excisional lymph node biopsy left groin 08/30/2021-Large B-cell lymphoma, CD20 positive, CD30 positive; FISH analysis-no evidence of a double/triple hit lymphoma -Cycle 1 R-CHOP 09/05/2021, Granix 09/06/2021 for 7 days -Cycle 2 R-CHOP 09/29/2021, Udenyca -Cycle 3 R-CHOP 10/20/2021, Udenyca -Cycle 4 R-CHOP 11/10/2021, Udenyca -PET 11/21/2021-complete resolution of hypermetabolic lymphadenopathy in some areas and marked decrease in hypermetabolic lymphadenopathy and others, [place]ville 1-2, diffuse groundglass lung opacity with hypermetabolism has resolved, decrease in splenomegaly -Cycle 5 R-CHOP 12/11/2021 -Cycle 6 R-CHOP 01/08/2022 Fatigue Dyspnea on exertion Unsteady gait/balance disorder, followed by Dr. Everlena Cooper CKD Atrial fibrillation Atrial fibrillation with rapid ventricular response on admission 04/20/2023 Hypertension Sleep apnea History of anorexia/weight loss 06/13/2021 skin rash, question vasculitis-course of prednisone initiated; 06/20/2021 marked improvement, rash recurred when she was tapered off of prednisone, prednisone resumed by dermatology History of coagulopathy-most likely related to malnutrition and apixaban Port-A-Cath placement  09/09/2021 Left groin seroma following the left inguinal lymph node biopsy-drained in interventional radiology June 2024 and by Dr. Carolynne Edouard 07/17/2022 Admission 04/20/2023 with failure to thrive, altered mental status, and rapid atrial fibrillation CT head 04/20/2023: 2.7 cm hyperdense intra-axial mass at the left splenium with mild surrounding edema 15.  Thrombocytopenia    [name] [name] has a history of non-Hodgkin's lymphoma.  She is now admitted with failure to thrive, altered mental status, rapid atrial fibrillation, thrombocytopenia, and a CT head reveals a brain mass.  I am concerned the clinical presentation is related to recurrence of non-Hodgkin's lymphoma.  She could have a primary brain tumor or metastatic disease from another primary tumor site.  She is being scheduled for a brain MRI.  We consult neurosurgery after the MRI to consider a diagnostic biopsy.  We will schedule additional staging evaluation.  Recommendations: Management of atrial fibrillation per cardiology Brain MRI with/without contrast LDH I will review peripheral blood smear today Oncology will continue following her in the hospital and outpatient follow-up will be scheduled Cancer center

## 2023-04-21 NOTE — Progress Notes (Addendum)
 PROGRESS NOTE  [name] [name]  ZOX:[id number] DOB: 1946-01-08 DOA: 04/20/2023 PCP: Laurann Montana, MD   Brief Narrative: Patient is a 78 y.o. female with medical history significant of permanent A-fib status post Watchman implant currently not on anticoagulation, status post cardioversion in the past, non-Hodgkin's lymphoma, hypothyroidism, hyperlipidemia, anxiety, CKD stage IIIa, HFpEF, hypertension who presented from home with complaint of weakness, fatigue, confusion. On presentation, she was in rapid A-fib with heart rate in the range of 130s.  Cardiology consulted, started on Cardizem drip.  CT scan of the brain showed 2.7 cm hyperdense intra-axial mass with mild surrounding edema concerning for metastatic disease.  Plan for MRI of the brain with and without contrast.  Oncology consulted  Assessment & Plan:  Principal Problem:   Chronic a-fib (HCC) Active Problems:   Generalized anxiety disorder   Hyperlipidemia   Essential hypertension   (HFpEF) heart failure with preserved ejection fraction (HCC)   Hypothyroidism   Chronic kidney disease, stage 3 unspecified (HCC)   AKI (acute kidney injury) (HCC)   A-fib with RVR: History of chronic A-fib.  CHADS2VASC score of 6. Status post Watchman implant about 3 months ago.  Status post several cardioversions, the last one on 05/15/2021.  Follows with cardiology.  Heart rate in the range of 130-140 on presentation.  Started on Cardizem drip.  Cardiology  consulted.  Currently not on anticoagulation.  Takes oral Cardizem at home.  Currently taking Plavix  Possible brain mets/confusion /history of non-Hodgkin's lymphoma: Follows with Dr. Myrle Sheng.  Status posttreatment with chemotherapy and Rituxan.  Has chronic thrombocytopenia. CT scan of the brain showed 2.7 cm hyperdense intra-axial mass with mild surrounding edema concerning for metastatic disease.  Plan for MRI of the brain with and without contrast.  Oncology consulted.  This could be contributing  to the confusion as well   AKI in CKD stage IIIa: Baseline creatinine around 1.5.  Her last creatinine was 1.1.  Presented with creatinine in the range of 1.9.  Likely associated with with poor oral intake.  Improved with IV fluid.   Hypertension: Continue current medication.  Monitor blood pressure   History of OSA: Currently not on CPAP   History of anxiety/depression: On Xanax, Wellbutrin   Chronic HFpEF: Last echo showed EF of 60 to 65%.  Currently might be dehydrated. Started gentle IV fluid. takes Lasix 20 mg as needed at home   Hypothyroidism: Continue Synthyroid   History of hyperlipidemia: On pravastatin  Hypokalemia/hyponatremia: Continue to monitor sodium level, potassium supplemented   Confusion: New problem as per the family.   Will hold Xanax.  MRI abnormal.  CT brain as above.  UA also suspicious for UTI.  Started on ceftriaxone.  Will get urine culture   Generalized weakness: Will consult PT/OT when appropriate        DVT prophylaxis:enoxaparin (LOVENOX) injection 40 mg Start: 04/21/23 2200     Code Status: Full Code  Family Communication: Daughter and husband at bedside  Patient status: Inpatient  Patient is from : Home  Anticipated discharge to: Home  Estimated DC date: 2 to 3 days, awaiting full workup   Consultants: Cardiology, oncology  Procedures: None  Antimicrobials:  Anti-infectives (From admission, onward)    Start     Dose/Rate Route Frequency Ordered Stop   04/21/23 0830  cefTRIAXone (ROCEPHIN) 1 g in sodium chloride 0.9 % 100 mL IVPB  Status:  Discontinued        1 g 200 mL/hr over 30 Minutes Intravenous Every  24 hours 04/21/23 0730 04/21/23 0743   04/21/23 0830  fosfomycin (MONUROL) packet 3 g        3 g Oral every 72 hours 04/21/23 0742 04/27/23 0829       Subjective: Patient seen and examined at the bedside today.  Hemodynamically stable.  Comfortable lying on her bed.  Still on Afib  with heart rate in the range of 100-120.   Obeys commands well, coherent but confused to time.  Oriented to place only.  Not in any Distress.  Objective: Vitals:   04/21/23 0600 04/21/23 0615 04/21/23 0700 04/21/23 0800  BP: (!) 113/58 (!) 141/68 116/71   Pulse: (!) 105 (!) 126 (!) 108   Resp: (!) 21 (!) 24 17   Temp:    97.8 F (36.6 C)  TempSrc:    Axillary  SpO2: 92% 93% 98%   Weight:      Height:        Intake/Output Summary (Last 24 hours) at 04/21/2023 1037 Last data filed at 04/21/2023 1610 Gross per 24 hour  Intake 969.06 ml  Output --  Net 969.06 ml   Filed Weights   04/20/23 2140  Weight: 99.7 kg    Examination:  General exam: Overall comfortable, not in distress,obese HEENT: PERRL Respiratory system:  no wheezes or crackles  Cardiovascular system: Irregularly irregular rhythm Gastrointestinal system: Abdomen is nondistended, soft and nontender. Central nervous system: Alert and awake, oriented to place only Extremities: No edema, no clubbing ,no cyanosis Skin: No rashes, no ulcers,no icterus     Data Reviewed: I have personally reviewed following labs and imaging studies  CBC: Recent Labs  Lab 04/20/23 1422 04/21/23 0248  WBC 5.1 3.6*  NEUTROABS 2.9 1.7  HGB 15.5* 12.4  HCT 45.8 37.8  MCV 99.8 100.5*  PLT 76* 63*   Basic Metabolic Panel: Recent Labs  Lab 04/20/23 1422 04/21/23 0248  NA 134* 129*  K 4.1 3.2*  CL 97* 97*  CO2 24 23  GLUCOSE 135* 127*  BUN 31* 29*  CREATININE 1.94* 1.56*  CALCIUM 9.6 8.4*  MG 2.1  --      Recent Results (from the past 240 hours)  Resp panel by RT-PCR (RSV, Flu A&B, Covid) Anterior Nasal Swab     Status: None   Collection Time: 04/20/23  2:22 PM   Specimen: Anterior Nasal Swab  Result Value Ref Range Status   SARS Coronavirus 2 by RT PCR NEGATIVE NEGATIVE Final    Comment: (NOTE) SARS-CoV-2 target nucleic acids are NOT DETECTED.  The SARS-CoV-2 RNA is generally detectable in upper respiratory specimens during the acute phase of infection. The  lowest concentration of SARS-CoV-2 viral copies this assay can detect is 138 copies/mL. A negative result does not preclude SARS-Cov-2 infection and should not be used as the sole basis for treatment or other patient management decisions. A negative result may occur with  improper specimen collection/handling, submission of specimen other than nasopharyngeal swab, presence of viral mutation(s) within the areas targeted by this assay, and inadequate number of viral copies(<138 copies/mL). A negative result must be combined with clinical observations, patient history, and epidemiological information. The expected result is Negative.  Fact Sheet for Patients:  BloggerCourse.com  Fact Sheet for Healthcare Providers:  SeriousBroker.it  This test is no t yet approved or cleared by the [place] FDA and  has been authorized for detection and/or diagnosis of SARS-CoV-2 by FDA under an Emergency Use Authorization (EUA). This EUA will remain  in  effect (meaning this test can be used) for the duration of the COVID-19 declaration under Section 564(b)(1) of the Act, 21 U.S.C.section 360bbb-3(b)(1), unless the authorization is terminated  or revoked sooner.       Influenza A by PCR NEGATIVE NEGATIVE Final   Influenza B by PCR NEGATIVE NEGATIVE Final    Comment: (NOTE) The Xpert Xpress SARS-CoV-2/FLU/RSV plus assay is intended as an aid in the diagnosis of influenza from Nasopharyngeal swab specimens and should not be used as a sole basis for treatment. Nasal washings and aspirates are unacceptable for Xpert Xpress SARS-CoV-2/FLU/RSV testing.  Fact Sheet for Patients: BloggerCourse.com  Fact Sheet for Healthcare Providers: SeriousBroker.it  This test is not yet approved or cleared by the [place] FDA and has been authorized for detection and/or diagnosis of SARS-CoV-2 by FDA under  an Emergency Use Authorization (EUA). This EUA will remain in effect (meaning this test can be used) for the duration of the COVID-19 declaration under Section 564(b)(1) of the Act, 21 U.S.C. section 360bbb-3(b)(1), unless the authorization is terminated or revoked.     Resp Syncytial Virus by PCR NEGATIVE NEGATIVE Final    Comment: (NOTE) Fact Sheet for Patients: BloggerCourse.com  Fact Sheet for Healthcare Providers: SeriousBroker.it  This test is not yet approved or cleared by the [place] FDA and has been authorized for detection and/or diagnosis of SARS-CoV-2 by FDA under an Emergency Use Authorization (EUA). This EUA will remain in effect (meaning this test can be used) for the duration of the COVID-19 declaration under Section 564(b)(1) of the Act, 21 U.S.C. section 360bbb-3(b)(1), unless the authorization is terminated or revoked.  Performed at [facility], [address] [place], [place] [address]   MRSA Next Gen by PCR, Nasal     Status: None   Collection Time: 04/20/23 10:01 PM   Specimen: Nasal Mucosa; Nasal Swab  Result Value Ref Range Status   MRSA by PCR Next Gen NOT DETECTED NOT DETECTED Final    Comment: (NOTE) The GeneXpert MRSA Assay (FDA approved for NASAL specimens only), is one component of a comprehensive MRSA colonization surveillance program. It is not intended to diagnose MRSA infection nor to guide or monitor treatment for MRSA infections. Test performance is not FDA approved in patients less than 26 years old. Performed at [facility], [address], [place], [place] [address]      Radiology Studies: CT HEAD WO CONTRAST ( ) Result Date: 04/21/2023 CLINICAL DATA:  Initial evaluation for delirium. EXAM: CT HEAD WITHOUT CONTRAST TECHNIQUE: Contiguous axial images were obtained from the base of the skull through the vertex without intravenous contrast.  RADIATION DOSE REDUCTION: This exam was performed according to the departmental dose-optimization program which includes automated exposure control, adjustment of the mA and/or kV according to patient size and/or use of iterative reconstruction technique. COMPARISON:  None Available. FINDINGS: Brain: Cerebral volume within normal limits. No acute intracranial hemorrhage. No acute large vessel territory infarct. Well-circumscribed ovoid hyperdense mildly expansile mass positioned at the left aspect of the splenium measures up to 2.7 cm (series 2, image 17). Mild surrounding edema without significant regional mass effect. This appears intra-axial in location. No other mass lesion, mass effect or midline shift. No hydrocephalus or extra-axial fluid collection. Vascular: No abnormal hyperdense vessel. Skull: Scalp soft tissues within normal limits.  Calvarium intact. Sinuses/Orbits: Globes orbital soft tissues within normal limits. Paranasal sinuses are clear. No mastoid effusion. Other: None. IMPRESSION: 1. 2.7 cm hyperdense intra-axial mass positioned  at the left aspect of the splenium. Mild surrounding edema without significant regional mass effect. Finding is nonspecific, but concerning for a neoplastic process, with lymphoma a consideration given patient history as well as the periventricular location. Further evaluation with dedicated brain MRI, with and without contrast, recommended for further evaluation. 2. No other acute intracranial abnormality. Electronically Signed   By: Rise Mu M.D.   On: 04/21/2023 00:17   DG Chest Portable 1 View Result Date: 04/20/2023 CLINICAL DATA:  Shortness of breath.  Fatigue. EXAM: PORTABLE CHEST 1 VIEW COMPARISON:  Radiograph 02/04/2023 FINDINGS: Right chest port in place. Mild cardiomegaly, stable. Mediastinal contours are unchanged. Subsegmental atelectasis or scarring at the left lung base. No acute airspace disease, pleural effusion, pulmonary edema or  pneumothorax. IMPRESSION: Mild chronic cardiomegaly. Subsegmental atelectasis or scarring at the left lung base. Electronically Signed   By: Narda Rutherford M.D.   On: 04/20/2023 16:14    Scheduled Meds:  buPROPion  150 mg Oral Daily   Chlorhexidine Gluconate Cloth  6 each Topical Q2200   clopidogrel  75 mg Oral Daily   enoxaparin (LOVENOX) injection  40 mg Subcutaneous Q24H   fosfomycin  3 g Oral Q72H   levothyroxine  75 mcg Oral QAC breakfast   polyethylene glycol  17 g Oral Daily   pravastatin  40 mg Oral QHS   sodium chloride flush  10-40 mL Intracatheter Q12H   Continuous Infusions:  sodium chloride 75 mL/hr at 04/21/23 0657   diltiazem (CARDIZEM) infusion 15 mg/hr (04/21/23 0853)     LOS: 1 day   Burnadette Pop, MD [facility] P4/09/2023, 10:37 AM

## 2023-04-21 NOTE — TOC Initial Note (Signed)
 Transition of Care [facility]) - Initial/Assessment Note    Patient Details  Name: [name] [name] MRN: [id number] Date of Birth: 16-Dec-1945  [facility]) CM/SW Contact:    Howell Rucks, RN Phone Number: 04/21/2023, 11:33 AM  Clinical Narrative:    Met with patient and spouse at bedside to introduce role of TOC/NCM and review for dc planning, patient reports she has an established PCP and pharmacy in place, no current home care services, reports she has all the home DME needed, canes , walkers, etc, reports she resides with her spouse and feels safe returning home. TOC will continue to follow.                Expected Discharge Plan: Home w Home Health Services Barriers to Discharge: Continued Medical Work up   Patient Goals and CMS Choice Patient states their goals for this hospitalization and ongoing recovery are:: return home          Expected Discharge Plan and Services       Living arrangements for the past 2 months: Single Family Home                                      Prior Living Arrangements/Services Living arrangements for the past 2 months: Single Family Home Lives with:: Spouse (spouse report pt has all the home DME needed, etc ,[place], shower chair, etc) Patient language and need for interpreter reviewed:: Yes Do you feel safe going back to the place where you live?: Yes      Need for Family Participation in Patient Care: Yes (Comment) Care giver support system in place?: Yes (comment) Current home services: DME Criminal Activity/Legal Involvement Pertinent to Current Situation/Hospitalization: No - Comment as needed  Activities of Daily Living   ADL Screening (condition at time of admission) Independently performs ADLs?: Yes (appropriate for developmental age) Is the patient deaf or have difficulty hearing?: No Does the patient have difficulty seeing, even when wearing glasses/contacts?: No Does the patient have difficulty concentrating,  remembering, or making decisions?: No  Permission Sought/Granted                  Emotional Assessment Appearance:: Appears stated age Attitude/Demeanor/Rapport: Gracious Affect (typically observed): Accepting Orientation: : Oriented to Self, Oriented to Place, Oriented to  Time, Oriented to Situation Alcohol / Substance Use: Not Applicable Psych Involvement: No (comment)  Admission diagnosis:  Chronic a-fib (HCC) [I48.20] AKI (acute kidney injury) (HCC) [N17.9] Atrial fibrillation with RVR (HCC) [I48.91] Paroxysmal A-fib (HCC) [I48.0] Patient Active Problem List   Diagnosis Date Noted   Paroxysmal A-fib (HCC) 04/20/2023   Chronic a-fib (HCC) 04/20/2023   AKI (acute kidney injury) (HCC) 04/20/2023   Atrial fibrillation (HCC) 02/04/2023   Severe tricuspid regurgitation 02/04/2023   Presence of Watchman left atrial appendage closure device 02/04/2023   A-fib (HCC) 11/04/2021   Anxiety 11/04/2021   Arthritis 11/04/2021   Cataracts, bilateral 11/04/2021   CKD (chronic kidney disease) stage 3, GFR 30-59 ml/min (HCC) 11/04/2021   Depression 11/04/2021   Hemorrhoid 11/04/2021   History of bronchitis 11/04/2021   History of peristent atrial fibrillation 11/04/2021   Urinary urgency 11/04/2021   NHL (non-Hodgkin's lymphoma) (HCC) 09/03/2021   Syncope 09/03/2021   Acquired thrombophilia (HCC) 09/03/2021   Loss of appetite 09/03/2021   Moderate recurrent major depression (HCC) 09/03/2021   Other specified disorders of bone density and  structure, other site 09/03/2021   Rectocele 09/03/2021   Prediabetes 09/03/2021   Pancytopenia (HCC) 08/29/2021   Generalized weakness 08/28/2021   Splenomegaly 08/20/2021   Hypercoagulable state due to permanent atrial fibrillation (HCC) 04/28/2021   Chronic kidney disease, stage 3 unspecified (HCC) 03/26/2021   Hardening of the aorta (main artery of the heart) (HCC) 03/26/2021   Drug-induced hypothyroidism 03/26/2021   Anemia 08/02/2020    Acute bronchitis 06/21/2020   Gait instability 08/01/2019   Disequilibrium 08/01/2019   Sensorineural hearing loss (SNHL) of both ears 08/01/2019   Hypothyroidism 04/04/2018   Long term current use of amiodarone 10/19/2017   OSA (obstructive sleep apnea) 10/19/2017   Chronic rhinitis 06/16/2017   Hoarseness 05/12/2017   Upper airway cough syndrome 03/19/2017   Rhinitis, allergic 03/19/2017   Severe recurrent major depression without psychotic features (HCC) 11/25/2016    Class: Chronic   Generalized anxiety disorder 11/25/2016    Class: Chronic   Persistent atrial fibrillation (HCC) 10/31/2016   Pruritus 10/31/2016   Primary localized osteoarthritis of left knee 09/21/2016   (HFpEF) heart failure with preserved ejection fraction (HCC) 07/06/2016   Chest pain 07/06/2016   Fatigue 07/06/2016   Dyspnea on exertion 04/03/2016   Mitral regurgitation 02/27/2016   PAF (paroxysmal atrial fibrillation) (HCC) 02/17/2016   Hyperlipidemia    Essential hypertension    Chronic cough    Gastroesophageal reflux disease without esophagitis    Bilateral carpal tunnel syndrome 11/06/2015   Cervical spondylosis without myelopathy 11/06/2015   Primary osteoarthritis of both first carpometacarpal joints 11/06/2015   Hx of colonic polyps 05/24/2014   S/P lumbar laminectomy 04/05/2013   Benign neoplasm of colon 12/29/2011   PCP:  Laurann Montana, MD Pharmacy:   [facility] [address] [place] - [address] [place] [address] Phone: [phone] Fax: [phone]     Social Drivers of Health (SDOH) Social History: SDOH Screenings   Food Insecurity: No Food Insecurity (04/20/2023)  Housing: Low Risk  (04/21/2023)  Transportation Needs: No Transportation Needs (04/20/2023)  Utilities: Not At Risk (04/20/2023)  Financial Resource Strain: Low Risk  (11/25/2016)  Physical Activity: Unknown (11/25/2016)  Social Connections: Moderately Integrated (04/20/2023)   Stress: Stress Concern Present (11/25/2016)  Tobacco Use: Low Risk  (03/09/2023)   SDOH Interventions:     Readmission Risk Interventions    04/21/2023   11:31 AM 09/09/2021   10:42 AM 09/01/2021    8:52 AM  Readmission Risk Prevention Plan  Transportation Screening Complete Complete Complete  PCP or Specialist Appt within 5-7 Days Complete    PCP or Specialist Appt within 3-5 Days   Complete  Home Care Screening Complete    Medication Review (RN CM) Complete    HRI or Home Care Consult   Complete  Social Work Consult for Recovery Care Planning/Counseling   Complete  Palliative Care Screening   Not Applicable  Medication Review Oceanographer)  Complete Complete  PCP or Specialist appointment within 3-5 days of discharge  Complete   HRI or Home Care Consult  Complete   SW Recovery Care/Counseling Consult  Complete   Palliative Care Screening  Not Applicable   Skilled Nursing Facility  Not Applicable

## 2023-04-21 NOTE — Consult Note (Signed)
 Cardiology Consultation   Patient ID: [name] [name] MRN: [id number]; DOB: 1945/06/08  Admit date: 04/20/2023 Date of Consult: 04/21/2023  PCP:  Laurann Montana, MD   [facility] Providers Cardiologist:  Donato Schultz, MD  Electrophysiologist:  Lanier Prude, MD       Patient Profile:   [name] [name] is a 78 y.o. female with a hx of permanent atrial fibrillation, s/p recent watchman implant with complete seal on CT 03/2023, CKD 3A, hypertension, hyperlipidemia, MDS, OSA, large B-cell lymphoma treated with chemotherapy now in remission, and depression who is being seen 04/21/2023 for the evaluation of atrial fibrillation at the request of Dr. Renford Dills.  History of Present Illness:   Ms. Rosier follows with cardiology for atrial fibrillation. She has had several DCCV (2018 x 2, 2023 x 4, 2024), but continued to have persistent atrial fibrillation.  Most recent unsuccessful cardioversion on 02/06/2022.  She was maintained on metoprolol tartrate 50 mg twice daily and Eliquis 5 mg twice daily.  She was initially on amiodarone, but this was discontinued due to side effects. It was later felt side effects may have been due to MDS and amiodarone was restarted.  She was referred to EP and is now considered permanent atrial fibrillation.  Amiodarone discontinued. She remains on 300 mg cardizem.  She was initially referred to Dr. Lalla Brothers in 2024 for consideration of Watchman given her MDS; however, procedure was deferred due to diagnosis of large B cell lymphoma.  She was in clinical remission following chemotherapy (RCHOP) and was seen back on 01/12/2023 and felt to be a good candidate to proceed with watchman.  She underwent watchman implant on 02/03/2023. CT on 04/07/2023 showed successful left atrial appendage occlusion with complete seal and no peri-device leak.  There is also no device related thrombus noted.  She completed 45 days of Eliquis and is now on Plavix to complete 6 months  (08/04/23).  She presented to [facility] 04/20[facility]23 with fatigue and SOB x 1 week. She also reports poor PO intake and was recently seen by OP psychiatry. She was in Afibr with RVR with heart rates in the 150-180 range. She was started on cardizem gtt. BMP with acute on chronic renal insufficiency with creatinine 1.9, up from baseline of 1.1-1.2. she was admitted and cardiology consulted for further management.   Husband at bedside reported new confusion. UA, ammonia, and head CT ordered.   EKG with Afib and VR 172. Troponin x 2 negative BNP 71 Ammonia 13 UA positive for bacteria and leukocytes Urine culture pending CXR with mild chronic cardiomegaly  Head CT with: 2.7 cm hyperdense intra-axial mass positioned at the left aspect of the splenium. Mild surrounding edema without significant regional mass effect. Finding is nonspecific, but concerning for a neoplastic process, with lymphoma a consideration given patient history as well as the periventricular location. Further evaluation with dedicated brain MRI, with and without contrast, recommended for further evaluation.  She has not received IV ABX.   During my interview, she reports ongoing battle with depression, not well managed on her wellbutrin. About 2 weeks ago, she reports onset of a depressive episode accompanied with poor PO intake and reduced appetite. Husband at bedside states that she has had new confusion over the past 1.5 weeks, worse in the last week. She is generally unaware of her RVR and has no other cardiac complaints.   She reports hives with amiodarone that resolved after about 1 week following discontinuation of amiodarone.  Past Medical History:  Diagnosis Date   (HFpEF) heart failure with preserved ejection fraction (HCC) 07/06/2016   A-fib (HCC)    paroxysmal   Acquired thrombophilia (HCC) 09/03/2021   Acute bronchitis 06/21/2020   Anemia 08/02/2020   Hgb 13.7-> 10.7 between 10/2019 and 07/2020   Anxiety     Arthritis    Benign neoplasm of colon 12/29/2011   Bilateral carpal tunnel syndrome 11/06/2015   Cataracts, bilateral    immature   Cervical spondylosis without myelopathy 11/06/2015   Chest pain 07/06/2016   Chronic cough    Chronic kidney disease, stage 3 unspecified (HCC) 03/26/2021   Chronic rhinitis 06/16/2017   CKD (chronic kidney disease) stage 3, GFR 30-59 ml/min (HCC)    Depression    Disequilibrium 08/01/2019   Drug-induced hypothyroidism 03/26/2021   Dyspnea on exertion 04/03/2016   PFT 08/02/20-mild obstruction, no resp to BD, mild reduction DLCO   Essential hypertension    Fatigue 07/06/2016   Gait instability 08/01/2019   Gastroesophageal reflux disease without esophagitis    Generalized anxiety disorder 11/25/2016   Generalized weakness 08/28/2021   Hardening of the aorta (main artery of the heart) (HCC) 03/26/2021   Hemorrhoid    History of blood transfusion 08/30/2021   while undergoing treatment for her lymphoma   History of bronchitis    couple of yrs ago   History of peristent atrial fibrillation    Hoarseness 05/12/2017   Hx of colonic polyps 05/24/2014   Hyperlipidemia    takes Pravastatin daily   Hypothyroidism 04/04/2018   Long term current use of amiodarone 10/19/2017   Loss of appetite 09/03/2021   Mitral regurgitation 02/27/2016   Moderate recurrent major depression (HCC) 09/03/2021   NHL (non-Hodgkin's lymphoma) (HCC) 09/03/2021   OSA (obstructive sleep apnea) 10/19/2017   HST 09/24/2018- AHI 30.5/ hr, desaturation to 76%, body weight 249.6 lbs Unable to tolerate CPAP, failed twice.   Other specified disorders of bone density and structure, other site 09/03/2021   PAF (paroxysmal atrial fibrillation) (HCC) 02/17/2016   Pancytopenia (HCC) 08/29/2021   Persistent atrial fibrillation (HCC) 10/31/2016   Prediabetes 09/03/2021   Presence of Watchman left atrial appendage closure device 02/04/2023   27mm Watchman FLX Pro device placed by Dr. Lalla Brothers    Primary localized osteoarthritis of left knee 09/21/2016   Primary osteoarthritis of both first carpometacarpal joints 11/06/2015   Pruritus 10/31/2016   Rectocele 09/03/2021   Rhinitis, allergic 03/19/2017   S/P lumbar laminectomy 04/05/2013   Secondary hypercoagulable state (HCC) 04/28/2021   Sensorineural hearing loss (SNHL) of both ears 08/01/2019   Severe recurrent major depression without psychotic features (HCC) 11/25/2016   Splenomegaly 08/20/2021   Syncope 09/03/2021   Trigger finger 12/31/2022   middle finger on left and right hand   Upper airway cough syndrome 03/19/2017   Urinary urgency     Past Surgical History:  Procedure Laterality Date   ABDOMINAL HYSTERECTOMY  1980   partial   BACK SURGERY     CARDIOVERSION N/A 02/24/2016   Procedure: CARDIOVERSION;  Surgeon: Pricilla Riffle, MD;  Location: [facility] ENDOSCOPY;  Service: Cardiovascular;  Laterality: N/A;   CARDIOVERSION N/A 03/19/2016   Procedure: CARDIOVERSION;  Surgeon: Pricilla Riffle, MD;  Location: [facility] ENDOSCOPY;  Service: Cardiovascular;  Laterality: N/A;   CARDIOVERSION N/A 05/15/2021   Procedure: CARDIOVERSION;  Surgeon: Chrystie Nose, MD;  Location: [facility] ENDOSCOPY;  Service: Cardiovascular;  Laterality: N/A;   CARDIOVERSION N/A 02/06/2022   Procedure: CARDIOVERSION;  Surgeon: Carolan Clines  E, MD;  Location: [facility] ENDOSCOPY;  Service: Cardiovascular;  Laterality: N/A;   CARPAL TUNNEL RELEASE     CHOLECYSTECTOMY  1975   COLONOSCOPY WITH PROPOFOL  12/29/2011   Procedure: COLONOSCOPY WITH PROPOFOL;  Surgeon: Shirley Friar, MD;  Location: [facility] ENDOSCOPY;  Service: Endoscopy;  Laterality: N/A;   COLONOSCOPY WITH PROPOFOL N/A 05/24/2014   Procedure: COLONOSCOPY WITH PROPOFOL;  Surgeon: Charlott Rakes, MD;  Location: [facility] ENDOSCOPY;  Service: Endoscopy;  Laterality: N/A;   ESOPHAGEAL MANOMETRY N/A 02/22/2017   Procedure: ESOPHAGEAL MANOMETRY (EM);  Surgeon: Charlott Rakes, MD;  Location: [facility] ENDOSCOPY;  Service:  Endoscopy;  Laterality: N/A;   ESOPHAGOGASTRODUODENOSCOPY     FOOT SURGERY     left, right   FRACTURE SURGERY     left leg-knee   HEEL SPUR EXCISION Bilateral    HOT HEMOSTASIS  12/29/2011   Procedure: HOT HEMOSTASIS (ARGON PLASMA COAGULATION/BICAP);  Surgeon: Shirley Friar, MD;  Location: [facility] ENDOSCOPY;  Service: Endoscopy;  Laterality: N/A;   HOT HEMOSTASIS N/A 05/24/2014   Procedure: HOT HEMOSTASIS (ARGON PLASMA COAGULATION/BICAP);  Surgeon: Charlott Rakes, MD;  Location: [facility] ENDOSCOPY;  Service: Endoscopy;  Laterality: N/A;   IR BONE MARROW BIOPSY & ASPIRATION  08/13/2021   IR IMAGING GUIDED PORT INSERTION  09/09/2021   LEFT ATRIAL APPENDAGE OCCLUSION N/A 02/04/2023   Procedure: LEFT ATRIAL APPENDAGE OCCLUSION;  Surgeon: Lanier Prude, MD;  Location: [facility] INVASIVE CV LAB;  Service: Cardiovascular;  Laterality: N/A;   leg surgery d/t break Left    LUMBAR LAMINECTOMY/DECOMPRESSION MICRODISCECTOMY Left 04/05/2013   Procedure: LUMBAR FOUR TO FIVE LUMBAR LAMINECTOMY/DECOMPRESSION MICRODISCECTOMY 1 LEVEL;  Surgeon: Tia Alert, MD;  Location: [facility] NEURO ORS;  Service: Neurosurgery;  Laterality: Left;   LYMPH NODE BIOPSY Left 08/30/2021   Procedure: LYMPH NODE BIOPSY;  Surgeon: Griselda Miner, MD;  Location: [facility] ORS;  Service: General;  Laterality: Left;   NISSEN FUNDOPLICATION     PH IMPEDANCE STUDY N/A 02/22/2017   Procedure: PH IMPEDANCE STUDY;  Surgeon: Charlott Rakes, MD;  Location: [facility] ENDOSCOPY;  Service: Endoscopy;  Laterality: N/A;   TEE WITHOUT CARDIOVERSION N/A 02/24/2016   Procedure: TRANSESOPHAGEAL ECHOCARDIOGRAM (TEE);  Surgeon: Pricilla Riffle, MD;  Location: [facility] ENDOSCOPY;  Service: Cardiovascular;  Laterality: N/A;   TOTAL KNEE ARTHROPLASTY Left 10/07/2016   Procedure: TOTAL KNEE ARTHROPLASTY;  Surgeon: Loreta Ave, MD;  Location: [facility] OR;  Service: Orthopedics;  Laterality: Left;   TRANSESOPHAGEAL ECHOCARDIOGRAM (CATH LAB) N/A 02/04/2023   Procedure: TRANSESOPHAGEAL  ECHOCARDIOGRAM;  Surgeon: Lanier Prude, MD;  Location: [facility] INVASIVE CV LAB;  Service: Cardiovascular;  Laterality: N/A;     Home Medications:  Prior to Admission medications   Medication Sig Start Date End Date Taking? Authorizing Provider  acetaminophen (TYLENOL) 500 MG tablet Take 1,000 mg by mouth every 6 (six) hours as needed for moderate pain.   Yes [provider]  albuterol (VENTOLIN HFA) 108 (90 Base) MCG/ACT inhaler Inhale 1-2 puffs into the lungs every 4 (four) hours as needed for wheezing or shortness of breath. 07/22/18  Yes Parrett, Tammy S, NP  benzonatate (TESSALON) 200 MG capsule Take 1 capsule by mouth three times daily as needed for cough Patient taking differently: Take 200 mg by mouth 3 (three) times daily as needed for cough. 05/22/19  Yes Parrett, Tammy S, NP  buPROPion (WELLBUTRIN XL) 150 MG 24 hr tablet Take 150 mg by mouth daily. 05/13/22  Yes [provider]  Calcium 250 MG CAPS Take 250  mg by mouth daily.   Yes [provider]  CARTIA XT 300 MG 24 hr capsule Take 300 mg by mouth daily.   Yes [provider]  dextromethorphan-guaiFENesin (MUCINEX DM) 30-600 MG 12hr tablet Take 1 tablet by mouth 2 (two) times daily as needed for cough.   Yes [provider]  diltiazem (CARDIZEM) 30 MG tablet Take 1 tablet by mouth once if heart rate greater than 100bpm Patient taking differently: Take 30 mg by mouth once as needed (for a heart rate greater than 100 beats per minute). 11/23/22  Yes Conte, Tessa N, PA-C  ELIQUIS 5 MG TABS tablet Take 5 mg by mouth 2 (two) times daily.   Yes [provider]  furosemide (LASIX) 20 MG tablet Take 1 tablet (20 mg total) by mouth daily as needed for edema or fluid. 09/01/21  Yes Gherghe, Daylene Katayama, MD  levothyroxine (SYNTHROID) 75 MCG tablet TAKE 1 TABLET DAILY BEFORE BREAKFAST (NEED APPOINTMENT FOR FURTHER REFILLS) Patient taking differently: Take 75 mcg by mouth daily before breakfast.  09/01/21  Yes Shamleffer, Konrad Dolores, MD  lidocaine-prilocaine (EMLA) cream Apply to Port-A-Cath site 1 to 2 hours prior to use 10/20/21  Yes Rana Snare, NP  Magnesium 250 MG TABS Take 250 mg by mouth at bedtime.   Yes [provider]  polyethylene glycol (MIRALAX) 17 g packet Take 17 g by mouth daily. Patient taking differently: Take 17 g by mouth daily as needed for mild constipation or moderate constipation. 09/29/21  Yes Ladene Artist, MD  potassium chloride SA (KLOR-CON M) 20 MEQ tablet Take 1 tablet (20 mEq total) by mouth daily as needed (Take potassium only when you take the Furosemide (Lasix)). Patient taking differently: Take 20 mEq by mouth daily as needed (only when taking Furosemide- AS DIRECTED). 09/01/21  Yes Leatha Gilding, MD  pravastatin (PRAVACHOL) 40 MG tablet Take 40 mg by mouth at bedtime.   Yes [provider]  ALPRAZolam (XANAX) 0.5 MG tablet Take 1 tablet (0.5 mg total) by mouth 3 (three) times daily as needed for anxiety. Patient not taking: Reported on 04/20/2023 03/11/21   Rana Snare, NP  clopidogrel (PLAVIX) 75 MG tablet Take 1 tablet (75 mg total) by mouth daily. Patient not taking: Reported on 04/20/2023 04/12/23   Lanier Prude, MD  diltiazem (CARDIZEM CD) 300 MG 24 hr capsule Take 1 capsule (300 mg total) by mouth daily. Patient not taking: Reported on 04/20/2023 03/09/23 06/07/23  Danice Goltz, PA  Spacer/Aero-Holding Chambers (AEROCHAMBER MV) inhaler Use as instructed 10/13/18   Waymon Budge, MD    Inpatient Medications: Scheduled Meds:  buPROPion  150 mg Oral Daily   Chlorhexidine Gluconate Cloth  6 each Topical Q2200   clopidogrel  75 mg Oral Daily   enoxaparin (LOVENOX) injection  40 mg Subcutaneous Q24H   fosfomycin  3 g Oral Q72H   levothyroxine  75 mcg Oral QAC breakfast   LORazepam  1 mg Intravenous Once   polyethylene glycol  17 g Oral Daily   pravastatin  40 mg Oral QHS   sodium chloride flush  10-40 mL  Intracatheter Q12H   Continuous Infusions:  sodium chloride 75 mL/hr at 04/21/23 0657   diltiazem (CARDIZEM) infusion 15 mg/hr (04/21/23 0853)   PRN Meds: acetaminophen, dextromethorphan-guaiFENesin  Allergies:    Allergies  Allergen Reactions   Omnicef [Cefdinir] Swelling and Other (See Comments)    Tongue swelling   Rituxan [Rituximab] Shortness Of Breath and  Other (See Comments)    Patient given Pepcid 20mg  IV with fluids and reported feeling better. Treatment was re-started and patient was able to tolerate the rest of the treatment with no issues.    Duloxetine Other (See Comments)    "Felt funny- thinking was messed up"   Escitalopram Other (See Comments)    Makes her feel funny and sleepy   Mirtazapine Other (See Comments)    Weight gain in high doses    Penicillins Swelling and Other (See Comments)    Tongue became swollen   Peroxide [Hydrogen Peroxide] Other (See Comments)    Redness.    Simvastatin Other (See Comments)    Muscle pain   Zoloft [Sertraline Hcl] Other (See Comments)    Makes her "feel funny"   Bacitracin Rash   Bacitracin-Polymyxin B Rash   Neosporin [Neomycin-Bacitracin Zn-Polymyx] Itching, Rash and Other (See Comments)    Blisters, too    Social History:   Social History   Socioeconomic History   Marital status: Married    Spouse name: Jillyn Hidden   Number of children: 2   Years of education: 12   Highest education level: Some college, no degree  Occupational History   Occupation: retired  Tobacco Use   Smoking status: Never   Smokeless tobacco: Never   Tobacco comments:    Never smoke 05/22/21  Vaping Use   Vaping status: Never Used  Substance and Sexual Activity   Alcohol use: No   Drug use: No   Sexual activity: Yes    Birth control/protection: Surgical    Comment: Hysterectomy  Other Topics Concern   Not on file  Social History Narrative   Right handed   Drinks caffeine   Two story home   Social Drivers of Health   Financial  Resource Strain: Low Risk  (11/25/2016)   Overall Financial Resource Strain (CARDIA)    Difficulty of Paying Living Expenses: Not very hard  Food Insecurity: No Food Insecurity (04/20/2023)   Hunger Vital Sign    Worried About Running Out of Food in the Last Year: Never true    Ran Out of Food in the Last Year: Never true  Transportation Needs: No Transportation Needs (04/20/2023)   PRAPARE - Administrator, Civil Service (Medical): No    Lack of Transportation (Non-Medical): No  Physical Activity: Unknown (11/25/2016)   Exercise Vital Sign    Days of Exercise per Week: Patient declined    Minutes of Exercise per Session: Patient declined  Stress: Stress Concern Present (11/25/2016)   [employer] of Occupational Health - Occupational Stress Questionnaire    Feeling of Stress : Very much  Social Connections: Moderately Integrated (04/20/2023)   Social Connection and Isolation Panel [NHANES]    Frequency of Communication with Friends and Family: More than three times a week    Frequency of Social Gatherings with Friends and Family: More than three times a week    Attends Religious Services: 1 to 4 times per year    Active Member of [employer] or Organizations: No    Attends Banker Meetings: Never    Marital Status: Married  Catering manager Violence: Not At Risk (04/21/2023)   Humiliation, Afraid, Rape, and Kick questionnaire    Fear of Current or Ex-Partner: No    Emotionally Abused: No    Physically Abused: No    Sexually Abused: No    Family History:    Family History  Problem Relation Age of Onset  Colon cancer Mother    Heart disease Father    Breast cancer Neg Hx    Thyroid disease Neg Hx      ROS:  Please see the history of present illness.   All other ROS reviewed and negative.     Physical Exam/Data:   Vitals:   04/21/23 0600 04/21/23 0615 04/21/23 0700 04/21/23 0800  BP: (!) 113/58 (!) 141/68 116/71   Pulse: (!) 105 (!) 126 (!) 108    Resp: (!) 21 (!) 24 17   Temp:    97.8 F (36.6 C)  TempSrc:    Axillary  SpO2: 92% 93% 98%   Weight:      Height:        Intake/Output Summary (Last 24 hours) at 04/21/2023 1050 Last data filed at 04/21/2023 0657 Gross per 24 hour  Intake 969.06 ml  Output --  Net 969.06 ml      04/20/2023    9:40 PM 03/09/2023    8:44 AM 02/04/2023    7:23 AM  Last 3 Weights  Weight (lbs) 219 lb 12.8 oz 235 lb 3.2 oz 233 lb  Weight (kg) 99.7 kg 106.686 kg 105.688 kg     Body mass index is 34.43 kg/m.  General:  elderly female in NAD HEENT: normal Neck: no JVD Vascular: No carotid bruits; Distal pulses 2+ bilaterally Cardiac: irregular rhythm, tachycardic rate Lungs:  clear to auscultation bilaterally, no wheezing, rhonchi or rales  Abd: soft, nontender, no hepatomegaly  Ext: no edema Musculoskeletal:  No deformities, BUE and BLE strength normal and equal Skin: warm and dry  Neuro:  CNs 2-12 intact, no focal abnormalities noted Psych:  Normal affect   EKG:  The EKG was personally reviewed and demonstrates:  Afib with VR 172 Telemetry:  Telemetry was personally reviewed and demonstrates:  Afib with rates in the 110-130s  Relevant CV Studies:  TEE 02/04/23:  1. Left ventricular ejection fraction, by estimation, is 55 to 60%. The  left ventricle has normal function.   2. Right ventricular systolic function is normal. The right ventricular  size is normal. There is mildly elevated pulmonary artery systolic  pressure. The estimated right ventricular systolic pressure is 42.5 mmHg.   3. Prior to the case, patent left atrial appendage. 2D maximal diameter  22 mm. 2D maximal diamter 23 mm.      A transeptal pucture was performed.      A 27 mm Watchman FLX Pro was placed with no leak. Small mitral  shoulder noted. Average compression ~ 20%. Successful placement.      After procedure, unchanged, trivial pericardial effusion.      There is an iatrogentic ASD; though predominantly left to right   shunting, shunting is bidirectional.. Left atrial size was severely  dilated. No left atrial/left atrial appendage thrombus was detected.   4. Right atrial size was severely dilated.   5. The mitral valve is normal in structure. Mild to moderate mitral valve  regurgitation. No evidence of mitral stenosis.   6. Severe functional tricuspid regurgitation noted. No hepatic flow  versal under general anesthesia. Jet is most prominent between  anterior-posterior and anterior-septal. Tricuspid valve regurgitation is  severe.   7. The aortic valve is tricuspid. Aortic valve regurgitation is not  visualized. No aortic stenosis is present.   8. Cannot exclude a small PFO.    Laboratory Data:  High Sensitivity Troponin:   Recent Labs  Lab 04/20/23 1422 04/20/23 1743  TROPONINIHS 14 12  Chemistry Recent Labs  Lab 04/20/23 1422 04/21/23 0248  NA 134* 129*  K 4.1 3.2*  CL 97* 97*  CO2 24 23  GLUCOSE 135* 127*  BUN 31* 29*  CREATININE 1.94* 1.56*  CALCIUM 9.6 8.4*  MG 2.1  --   GFRNONAA 26* 34*  ANIONGAP 13 9    Recent Labs  Lab 04/20/23 1422  PROT 6.8  ALBUMIN 3.9  AST 24  ALT 15  ALKPHOS 85  BILITOT 1.4*   Lipids No results for input(s): "CHOL", "TRIG", "HDL", "LABVLDL", "LDLCALC", "CHOLHDL" in the last 168 hours.  Hematology Recent Labs  Lab 04/20/23 1422 04/21/23 0248  WBC 5.1 3.6*  RBC 4.59 3.76*  HGB 15.5* 12.4  HCT 45.8 37.8  MCV 99.8 100.5*  MCH 33.8 33.0  MCHC 33.8 32.8  RDW 14.0 14.0  PLT 76* 63*   Thyroid  Recent Labs  Lab 04/20/23 1422  TSH 0.818    BNP Recent Labs  Lab 04/20/23 1743  BNP 71.0    DDimer No results for input(s): "DDIMER" in the last 168 hours.   Radiology/Studies:  CT HEAD WO CONTRAST ( ) Result Date: 04/21/2023 CLINICAL DATA:  Initial evaluation for delirium. EXAM: CT HEAD WITHOUT CONTRAST TECHNIQUE: Contiguous axial images were obtained from the base of the skull through the vertex without intravenous contrast.  RADIATION DOSE REDUCTION: This exam was performed according to the departmental dose-optimization program which includes automated exposure control, adjustment of the mA and/or kV according to patient size and/or use of iterative reconstruction technique. COMPARISON:  None Available. FINDINGS: Brain: Cerebral volume within normal limits. No acute intracranial hemorrhage. No acute large vessel territory infarct. Well-circumscribed ovoid hyperdense mildly expansile mass positioned at the left aspect of the splenium measures up to 2.7 cm (series 2, image 17). Mild surrounding edema without significant regional mass effect. This appears intra-axial in location. No other mass lesion, mass effect or midline shift. No hydrocephalus or extra-axial fluid collection. Vascular: No abnormal hyperdense vessel. Skull: Scalp soft tissues within normal limits.  Calvarium intact. Sinuses/Orbits: Globes orbital soft tissues within normal limits. Paranasal sinuses are clear. No mastoid effusion. Other: None. IMPRESSION: 1. 2.7 cm hyperdense intra-axial mass positioned at the left aspect of the splenium. Mild surrounding edema without significant regional mass effect. Finding is nonspecific, but concerning for a neoplastic process, with lymphoma a consideration given patient history as well as the periventricular location. Further evaluation with dedicated brain MRI, with and without contrast, recommended for further evaluation. 2. No other acute intracranial abnormality. Electronically Signed   By: Rise Mu M.D.   On: 04/21/2023 00:17   DG Chest Portable 1 View Result Date: 04/20/2023 CLINICAL DATA:  Shortness of breath.  Fatigue. EXAM: PORTABLE CHEST 1 VIEW COMPARISON:  Radiograph 02/04/2023 FINDINGS: Right chest port in place. Mild cardiomegaly, stable. Mediastinal contours are unchanged. Subsegmental atelectasis or scarring at the left lung base. No acute airspace disease, pleural effusion, pulmonary edema or  pneumothorax. IMPRESSION: Mild chronic cardiomegaly. Subsegmental atelectasis or scarring at the left lung base. Electronically Signed   By: Narda Rutherford M.D.   On: 04/20/2023 16:14     Assessment and Plan:   Afib with RVR - rates in the 150-180 range Permanent atrial fibrillation - TEE with severe RAE - has had multiple DCCV in the past, now felt in permanent Afib - maintained on 300 mg cardizem - hypothyroidism - TSH WNL - Mg and K WNL - now on cardizem gtt running at 15 mg/hr with acceptable BP  and improved HR in the 100-110s - she is generally asymptomatic - RVR likely a reaction to dehydration, AKI, ?UTI, and newly recognized brain mass - will add low dose metoprolol to attempt rates below 100    Watchman in place - complete seal on CT 03/2023 with no peri-device leak - completed 45 days of eliquis, now on plavix monotherapy   Acute on chronic renal insufficiency IIIa - baseline 1.1-1.2 - creatinine trending down to 1.5 form 1.9 on admission - suspect due to dehydration given poor PO intake   VHD Mild to moderate MR, severe TR - not volume up on exam, recent poor PO intake   Hypertension - on 300 mg cardizem at home - BP acceptable on IV cardizem   Hyperlipidemia  - on 40 mg pravastatin   Depression - I have recommended outpatient consult to psychiatry for medication management, sounds like uncontrolled MDD contributing to poor PO intake leading to dehydration and possibly RVR - if new brain mass felt related to lymphoma, rate control strategies will be important moving forward    Risk Assessment/Risk Scores:          CHA2DS2-VASc Score = 5   This indicates a 7.2% annual risk of stroke. The patient's score is based upon: CHF History: 1 HTN History: 1 Diabetes History: 0 Stroke History: 0 Vascular Disease History: 0 Age Score: 2 Gender Score: 1      For questions or updates, please contact  [facility] Please consult [email or web address] for  contact info under    Signed, Marcelino Duster, PA  04/21/2023 10:50 AM

## 2023-04-22 DIAGNOSIS — I482 Chronic atrial fibrillation, unspecified: Secondary | ICD-10-CM | POA: Diagnosis not present

## 2023-04-22 LAB — BASIC METABOLIC PANEL WITH GFR
Anion gap: 6 (ref 5–15)
BUN: 17 mg/dL (ref 8–23)
CO2: 24 mmol/L (ref 22–32)
Calcium: 8.8 mg/dL — ABNORMAL LOW (ref 8.9–10.3)
Chloride: 105 mmol/L (ref 98–111)
Creatinine, Ser: 1.19 mg/dL — ABNORMAL HIGH (ref 0.44–1.00)
GFR, Estimated: 47 mL/min — ABNORMAL LOW (ref >60)
Glucose, Bld: 112 mg/dL — ABNORMAL HIGH (ref 70–99)
Potassium: 4.4 mmol/L (ref 3.5–5.1)
Sodium: 135 mmol/L (ref 135–145)

## 2023-04-22 LAB — CBC
HCT: 39.4 % (ref 36.0–46.0)
Hemoglobin: 12.7 g/dL (ref 12.0–15.0)
MCH: 32.8 pg (ref 26.0–34.0)
MCHC: 32.2 g/dL (ref 30.0–36.0)
MCV: 101.8 fL — ABNORMAL HIGH (ref 80.0–100.0)
Platelets: 58 10*3/uL — ABNORMAL LOW (ref 150–400)
RBC: 3.87 MIL/uL (ref 3.87–5.11)
RDW: 14.3 % (ref 11.5–15.5)
WBC: 2.8 10*3/uL — ABNORMAL LOW (ref 4.0–10.5)
nRBC: 0 % (ref 0.0–0.2)

## 2023-04-22 LAB — GLUCOSE, CAPILLARY: Glucose-Capillary: 113 mg/dL — ABNORMAL HIGH (ref 70–99)

## 2023-04-22 MED ORDER — METOPROLOL TARTRATE 25 MG PO TABS
25.0000 mg | ORAL_TABLET | Freq: Two times a day (BID) | ORAL | Status: DC
  Administered 2023-04-22 – 2023-04-25 (×7): 25 mg via ORAL
  Filled 2023-04-22 (×7): qty 1

## 2023-04-22 MED ORDER — DEXAMETHASONE 2 MG PO TABS
4.0000 mg | ORAL_TABLET | Freq: Two times a day (BID) | ORAL | Status: DC
  Administered 2023-04-22 (×2): 4 mg via ORAL
  Filled 2023-04-22 (×2): qty 2

## 2023-04-22 MED ORDER — SODIUM CHLORIDE 0.9 % IV BOLUS: 1000.0000 mL | Freq: Once | INTRAVENOUS | Status: DC

## 2023-04-22 NOTE — Progress Notes (Addendum)
 PROGRESS NOTE  [name] [name]  VHQ:[id number] DOB: 08-26-1945 DOA: 04/20/2023 PCP: Laurann Montana, MD   Brief Narrative: Patient is a 78 y.o. female with medical history significant of permanent A-fib status post Watchman implant currently not on anticoagulation, status post cardioversion in the past, non-Hodgkin's lymphoma, hypothyroidism, hyperlipidemia, anxiety, CKD stage IIIa, HFpEF, hypertension who presented from home with complaint of weakness, fatigue, confusion. On presentation, she was in rapid A-fib with heart rate in the range of 130s.  Cardiology consulted, started on Cardizem drip.  CT scan of the brain showed 2.7 cm hyperdense intra-axial mass with mild surrounding edema concerning for metastatic disease.  MRI of the brain with and without contrast showed 2.6 x 1.4 x 1.8 cm enhancing mass centered within the left aspect of the callosal splenium with mild associated edema and mild effacement of the left lateral ventricle..  Oncology consulted and following  Assessment & Plan:  Principal Problem:   Chronic a-fib (HCC) Active Problems:   Generalized anxiety disorder   Hyperlipidemia   Essential hypertension   (HFpEF) heart failure with preserved ejection fraction (HCC)   Hypothyroidism   Chronic kidney disease, stage 3 unspecified (HCC)   Atrial fibrillation with RVR (HCC)   AKI (acute kidney injury) (HCC)   Brain mass   A-fib with RVR: History of chronic A-fib.  CHADS2VASC score of 6. Status post Watchman implant about 3 months ago.  Status post several cardioversions, the last one on 05/15/2021.  Follows with cardiology.  Heart rate in the range of 130-140 on presentation.  Started on Cardizem drip.  Cardiology  consulted.  Currently not on anticoagulation.  Takes oral Cardizem at home.  Currently taking Plavix  Possible brain mets or primary CNS malignancy/confusion /history of non-Hodgkin's lymphoma: Follows with Dr. Myrle Sheng.  Status posttreatment with chemotherapy and Rituxan.   Has chronic thrombocytopenia. CT scan of the brain showed 2.7 cm hyperdense intra-axial mass with mild surrounding edema concerning for metastatic disease.  MRI showed  2.6 x 1.4 x 1.8 cm enhancing mass centered within the left aspect of the callosal splenium with mild associated edema and mild effacement of the left lateral ventricle.  Most likely lymphoma, primary CNS manage not ruled out. plan for lumbar puncture for CSF studies but patient currently declining.  Will await for further oncology recommendation. She has leukopenia, thrombocytopenia, likely associated  with lymphoma  AKI in CKD stage IIIa: Baseline creatinine around 1.5.  Her last creatinine was 1.1.  Presented with creatinine in the range of 1.9.  Likely associated with with poor oral intake.  Improved with IV fluid.   Hypertension: Continue current medication.  Monitor blood pressure   History of OSA: Currently not on CPAP   History of anxiety/depression: On Xanax, Wellbutrin   Chronic HFpEF: Last echo showed EF of 60 to 65%.  Currently might be dehydrated. Started gentle IV fluid. takes Lasix 20 mg as needed at home   Hypothyroidism: Continue Synthyroid   History of hyperlipidemia: On pravastatin  Hypokalemia/hyponatremia: Continue to monitor sodium level, potassium supplemented and corrected   Confusion: New problem as per the family.   Likely due to metastatic lymphoma or primary CNS malignancy.urine culture showed 20,000 colonies of E. coli.  Already given 2 doses of fosfomycin  Goals of care: Elderly patient with multiple comorbidities, including CNS neoplasm, metastatic Hodgkin's lymphoma.  Palliative care consulted for goals of care   Generalized weakness: Will consult PT/OT when appropriate        DVT prophylaxis:enoxaparin (LOVENOX) injection  40 mg Start: 04/21/23 2200     Code Status: Full Code  Family Communication: Daughter and husband at bedside on 4/10  Patient status: Inpatient  Patient is  from : Home  Anticipated discharge to: Home  Estimated DC date: 2 to 3 days, awaiting full workup   Consultants: Cardiology, oncology  Procedures: None  Antimicrobials:  Anti-infectives (From admission, onward)    Start     Dose/Rate Route Frequency Ordered Stop   04/21/23 0830  cefTRIAXone (ROCEPHIN) 1 g in sodium chloride 0.9 % 100 mL IVPB  Status:  Discontinued        1 g 200 mL/hr over 30 Minutes Intravenous Every 24 hours 04/21/23 0730 04/21/23 0743   04/21/23 0830  fosfomycin (MONUROL) packet 3 g        3 g Oral every 72 hours 04/21/23 0742 04/27/23 0829       Subjective: Patient seen and examined at bedside today.  Remains in A-fib with RVR.  Lying in bed.  Not in any acute distress.  Family at bedside.  As per daughter, she is more coherant  today.  Denies any abdomen, nausea, vomiting or chest pain.  Objective: Vitals:   04/22/23 0845 04/22/23 0900 04/22/23 0918 04/22/23 0920  BP: (!) 138/59  (!) 139/56   Pulse:  (!) 112  (!) 110  Resp:  (!) 28  (!) 23  Temp:      TempSrc:      SpO2:  97%    Weight:      Height:        Intake/Output Summary (Last 24 hours) at 04/22/2023 1105 Last data filed at 04/22/2023 1191 Gross per 24 hour  Intake 1376.02 ml  Output 500 ml  Net 876.02 ml   Filed Weights   04/20/23 2140  Weight: 99.7 kg    Examination:   General exam: Overall comfortable, not in distress,obese ,pleasant female HEENT: PERRL Respiratory system:  no wheezes or crackles  Cardiovascular system: Irregular irregular rhythm Gastrointestinal system: Abdomen is nondistended, soft and nontender. Central nervous system: Alert and oriented Extremities: No edema, no clubbing ,no cyanosis Skin: No rashes, no ulcers,no icterus      Data Reviewed: I have personally reviewed following labs and imaging studies  CBC: Recent Labs  Lab 04/20/23 1422 04/21/23 0248 04/22/23 0500  WBC 5.1 3.6* 2.8*  NEUTROABS 2.9 1.7  --   HGB 15.5* 12.4 12.7  HCT 45.8  37.8 39.4  MCV 99.8 100.5* 101.8*  PLT 76* 63* 58*   Basic Metabolic Panel: Recent Labs  Lab 04/20/23 1422 04/21/23 0248 04/22/23 0500  NA 134* 129* 135  K 4.1 3.2* 4.4  CL 97* 97* 105  CO2 24 23 24   GLUCOSE 135* 127* 112*  BUN 31* 29* 17  CREATININE 1.94* 1.56* 1.19*  CALCIUM 9.6 8.4* 8.8*  MG 2.1  --   --      Recent Results (from the past 240 hours)  Resp panel by RT-PCR (RSV, Flu A&B, Covid) Anterior Nasal Swab     Status: None   Collection Time: 04/20/23  2:22 PM   Specimen: Anterior Nasal Swab  Result Value Ref Range Status   SARS Coronavirus 2 by RT PCR NEGATIVE NEGATIVE Final    Comment: (NOTE) SARS-CoV-2 target nucleic acids are NOT DETECTED.  The SARS-CoV-2 RNA is generally detectable in upper respiratory specimens during the acute phase of infection. The lowest concentration of SARS-CoV-2 viral copies this assay can detect is 138 copies/mL. A negative  result does not preclude SARS-Cov-2 infection and should not be used as the sole basis for treatment or other patient management decisions. A negative result may occur with  improper specimen collection/handling, submission of specimen other than nasopharyngeal swab, presence of viral mutation(s) within the areas targeted by this assay, and inadequate number of viral copies(<138 copies/mL). A negative result must be combined with clinical observations, patient history, and epidemiological information. The expected result is Negative.  Fact Sheet for Patients:  BloggerCourse.com  Fact Sheet for Healthcare Providers:  SeriousBroker.it  This test is no t yet approved or cleared by the [place] FDA and  has been authorized for detection and/or diagnosis of SARS-CoV-2 by FDA under an Emergency Use Authorization (EUA). This EUA will remain  in effect (meaning this test can be used) for the duration of the COVID-19 declaration under Section 564(b)(1) of the  Act, 21 U.S.C.section 360bbb-3(b)(1), unless the authorization is terminated  or revoked sooner.       Influenza A by PCR NEGATIVE NEGATIVE Final   Influenza B by PCR NEGATIVE NEGATIVE Final    Comment: (NOTE) The Xpert Xpress SARS-CoV-2/FLU/RSV plus assay is intended as an aid in the diagnosis of influenza from Nasopharyngeal swab specimens and should not be used as a sole basis for treatment. Nasal washings and aspirates are unacceptable for Xpert Xpress SARS-CoV-2/FLU/RSV testing.  Fact Sheet for Patients: BloggerCourse.com  Fact Sheet for Healthcare Providers: SeriousBroker.it  This test is not yet approved or cleared by the [place] FDA and has been authorized for detection and/or diagnosis of SARS-CoV-2 by FDA under an Emergency Use Authorization (EUA). This EUA will remain in effect (meaning this test can be used) for the duration of the COVID-19 declaration under Section 564(b)(1) of the Act, 21 U.S.C. section 360bbb-3(b)(1), unless the authorization is terminated or revoked.     Resp Syncytial Virus by PCR NEGATIVE NEGATIVE Final    Comment: (NOTE) Fact Sheet for Patients: BloggerCourse.com  Fact Sheet for Healthcare Providers: SeriousBroker.it  This test is not yet approved or cleared by the [place] FDA and has been authorized for detection and/or diagnosis of SARS-CoV-2 by FDA under an Emergency Use Authorization (EUA). This EUA will remain in effect (meaning this test can be used) for the duration of the COVID-19 declaration under Section 564(b)(1) of the Act, 21 U.S.C. section 360bbb-3(b)(1), unless the authorization is terminated or revoked.  Performed at [facility], [address], [place], [place] [address]   MRSA Next Gen by PCR, Nasal     Status: None   Collection Time: 04/20/23 10:01 PM   Specimen: Nasal Mucosa; Nasal  Swab  Result Value Ref Range Status   MRSA by PCR Next Gen NOT DETECTED NOT DETECTED Final    Comment: (NOTE) The GeneXpert MRSA Assay (FDA approved for NASAL specimens only), is one component of a comprehensive MRSA colonization surveillance program. It is not intended to diagnose MRSA infection nor to guide or monitor treatment for MRSA infections. Test performance is not FDA approved in patients less than 77 years old. Performed at [facility], [address], [place], [place] [address]   Urine Culture (for pregnant, neutropenic or urologic patients or patients with an indwelling urinary catheter)     Status: Abnormal (Preliminary result)   Collection Time: 04/21/23  7:30 AM   Specimen: Urine, Clean Catch  Result Value Ref Range Status   Specimen Description   Final    URINE, CLEAN CATCH Performed at  [facility], [address], [place], [place] [address]    Special Requests   Final    NONE Performed at [facility], [address], [place], [place] [address]    Culture (A)  Final    20,000 COLONIES/mL ESCHERICHIA COLI SUSCEPTIBILITIES TO FOLLOW Performed at [facility], [address] [place], [place] [address]    Report Status PENDING  Incomplete     Radiology Studies: MR BRAIN W WO CONTRAST Result Date: 04/21/2023 CLINICAL DATA:  Provided history: Brain/CNS neoplasm, staging. Additional history obtained from electronic MEDICAL RECORD NUMBERHistory of Hodgkin's lymphoma. EXAM: MRI HEAD WITHOUT AND WITH CONTRAST TECHNIQUE: Multiplanar, multiecho pulse sequences of the brain and surrounding structures were obtained without and with intravenous contrast. CONTRAST:  10mL GADAVIST GADOBUTROL 1 MMOL/ML IV SOLN COMPARISON:  Head CT 04/20/2023. Brain MRI 04/27/2020. FINDINGS: Intermittently motion degraded examination. Most notably, the coronal T2 sequence is moderate-to-severely motion degraded. Within this limitation,  findings are as follows. Brain: Mild generalized cerebral atrophy. 2.6 x 1.4 x 1.8 cm homogeneously enhancing mass centered within the left aspect of the callosal splenium. Corresponding diffusion weighted signal abnormality. Mild associated edema. Mild partial effacement of the posterior body and atrium of the left lateral ventricle. Smooth abnormal enhancement is also noted along portions of both lateral ventricles (most notably the frontal horns)(for instance as seen on series 18, image 12). Mild multifocal T2 FLAIR hyperintense signal abnormality elsewhere within the cerebral white matter, nonspecific but compatible chronic small vessel ischemic disease. There is no acute infarct. No chronic intracranial blood products. No extra-axial fluid collection. No midline shift or hydrocephalus. Vascular: Maintained flow voids within the proximal large arterial vessels. Skull and upper cervical spine: No focal worrisome marrow lesion. Incompletely assessed cervical spondylosis. Sinuses/Orbits: No mass or acute finding within the imaged orbits. No significant paranasal sinus disease. IMPRESSION: 1. Intermittently motion degraded exam. 2. 2.6 x 1.4 x 1.8 cm enhancing mass centered within the left aspect of the callosal splenium with mild associated edema and mild effacement of the left lateral ventricle. Additionally, smooth abnormal enhancement is also noted along portions of both lateral ventricles (most notably the frontal horns). Lymphoma is favored. However, alternative considerations include a primary CNS neoplasm or metastasis with intraventricular dissemination of disease. If not already performed, consider a lumbar puncture for CSF sampling. Additionally, consider a total spine MRI (with and without contrast) for further evaluation. 3. Background parenchymal atrophy and mild cerebral white matter chronic small vessel ischemic disease. Electronically Signed   By: Jackey Loge D.O.   On: 04/21/2023 18:13   CT HEAD  WO CONTRAST ( ) Result Date: 04/21/2023 CLINICAL DATA:  Initial evaluation for delirium. EXAM: CT HEAD WITHOUT CONTRAST TECHNIQUE: Contiguous axial images were obtained from the base of the skull through the vertex without intravenous contrast. RADIATION DOSE REDUCTION: This exam was performed according to the departmental dose-optimization program which includes automated exposure control, adjustment of the mA and/or kV according to patient size and/or use of iterative reconstruction technique. COMPARISON:  None Available. FINDINGS: Brain: Cerebral volume within normal limits. No acute intracranial hemorrhage. No acute large vessel territory infarct. Well-circumscribed ovoid hyperdense mildly expansile mass positioned at the left aspect of the splenium measures up to 2.7 cm (series 2, image 17). Mild surrounding edema without significant regional mass effect. This appears intra-axial in location. No other mass lesion, mass effect or midline shift. No hydrocephalus or extra-axial fluid collection. Vascular: No abnormal hyperdense vessel. Skull: Scalp soft  tissues within normal limits.  Calvarium intact. Sinuses/Orbits: Globes orbital soft tissues within normal limits. Paranasal sinuses are clear. No mastoid effusion. Other: None. IMPRESSION: 1. 2.7 cm hyperdense intra-axial mass positioned at the left aspect of the splenium. Mild surrounding edema without significant regional mass effect. Finding is nonspecific, but concerning for a neoplastic process, with lymphoma a consideration given patient history as well as the periventricular location. Further evaluation with dedicated brain MRI, with and without contrast, recommended for further evaluation. 2. No other acute intracranial abnormality. Electronically Signed   By: Rise Mu M.D.   On: 04/21/2023 00:17   DG Chest Portable 1 View Result Date: 04/20/2023 CLINICAL DATA:  Shortness of breath.  Fatigue. EXAM: PORTABLE CHEST 1 VIEW COMPARISON:   Radiograph 02/04/2023 FINDINGS: Right chest port in place. Mild cardiomegaly, stable. Mediastinal contours are unchanged. Subsegmental atelectasis or scarring at the left lung base. No acute airspace disease, pleural effusion, pulmonary edema or pneumothorax. IMPRESSION: Mild chronic cardiomegaly. Subsegmental atelectasis or scarring at the left lung base. Electronically Signed   By: Narda Rutherford M.D.   On: 04/20/2023 16:14    Scheduled Meds:  buPROPion  150 mg Oral Daily   Chlorhexidine Gluconate Cloth  6 each Topical Q2200   clopidogrel  75 mg Oral Daily   enoxaparin (LOVENOX) injection  40 mg Subcutaneous Q24H   fosfomycin  3 g Oral Q72H   levothyroxine  75 mcg Oral QAC breakfast   metoprolol tartrate  25 mg Oral BID   nystatin   Topical TID   polyethylene glycol  17 g Oral Daily   pravastatin  40 mg Oral QHS   sodium chloride flush  10-40 mL Intracatheter Q12H   Continuous Infusions:  diltiazem (CARDIZEM) infusion 15 mg/hr (04/22/23 1018)     LOS: 2 days   Burnadette Pop, MD [facility] P4/10/2023, 11:05 AM

## 2023-04-22 NOTE — Plan of Care (Signed)
   Problem: Education: Goal: Knowledge of General Education information will improve Description: Including pain rating scale, medication(s)/side effects and non-pharmacologic comfort measures Outcome: Progressing   Problem: Activity: Goal: Risk for activity intolerance will decrease Outcome: Progressing   Problem: Nutrition: Goal: Adequate nutrition will be maintained Outcome: Progressing   Problem: Coping: Goal: Level of anxiety will decrease Outcome: Progressing

## 2023-04-22 NOTE — Progress Notes (Signed)
 Rounding Note    Patient Name: [name] [name] Date of Encounter: 04/22/2023  [facility] Cardiologist: Donato Schultz, MD   Subjective   Feeling OK.  No CP or palpitations.  Inpatient Medications    Scheduled Meds:  buPROPion  150 mg Oral Daily   Chlorhexidine Gluconate Cloth  6 each Topical Q2200   clopidogrel  75 mg Oral Daily   enoxaparin (LOVENOX) injection  40 mg Subcutaneous Q24H   fosfomycin  3 g Oral Q72H   levothyroxine  75 mcg Oral QAC breakfast   nystatin   Topical TID   polyethylene glycol  17 g Oral Daily   pravastatin  40 mg Oral QHS   sodium chloride flush  10-40 mL Intracatheter Q12H   Continuous Infusions:  diltiazem (CARDIZEM) infusion 15 mg/hr (04/22/23 0424)   PRN Meds: acetaminophen, dextromethorphan-guaiFENesin   Vital Signs    Vitals:   04/22/23 0845 04/22/23 0900 04/22/23 0918 04/22/23 0920  BP: (!) 138/59  (!) 139/56   Pulse:  (!) 112  (!) 110  Resp:  (!) 28  (!) 23  Temp:      TempSrc:      SpO2:  97%    Weight:      Height:        Intake/Output Summary (Last 24 hours) at 04/22/2023 0953 Last data filed at 04/22/2023 0912 Gross per 24 hour  Intake 1376.02 ml  Output 500 ml  Net 876.02 ml      04/20/2023    9:40 PM 03/09/2023    8:44 AM 02/04/2023    7:23 AM  Last 3 Weights  Weight (lbs) 219 lb 12.8 oz 235 lb 3.2 oz 233 lb  Weight (kg) 99.7 kg 106.686 kg 105.688 kg      Telemetry    Atrial fibrillation.  Rate 100-140 - Personally Reviewed  ECG    N/a - Personally Reviewed  Physical Exam   VS:  BP (!) 139/56   Pulse (!) 110   Temp (!) 97.5 F (36.4 C) (Oral)   Resp (!) 23   Ht 5\' 7"  (1.702 m)   Wt 99.7 kg   SpO2 97%   BMI 34.43 kg/m  , BMI Body mass index is 34.43 kg/m. GENERAL:  Chronically ill-appearing HEENT: Pupils equal round and reactive, fundi not visualized, oral mucosa unremarkable NECK:  No jugular venous distention, waveform within normal limits, carotid upstroke brisk and symmetric, no  bruits, no thyromegaly LUNGS:  Clear to auscultation bilaterally HEART:  Tachycardic.  Irregularly irregular.  PMI not displaced or sustained,S1 and S2 within normal limits, no S3, no S4, no clicks, no rubs, no murmurs ABD:  Flat, positive bowel sounds normal in frequency in pitch, no bruits, no rebound, no guarding, no midline pulsatile mass, no hepatomegaly, no splenomegaly EXT:  2 plus pulses throughout, no edema, no cyanosis no clubbing SKIN:  No rashes no nodules NEURO:  Cranial nerves II through XII grossly intact, motor grossly intact throughout PSYCH:  Cognitively intact, oriented to person place and time   Labs    High Sensitivity Troponin:   Recent Labs  Lab 04/20/23 1422 04/20/23 1743  TROPONINIHS 14 12     Chemistry Recent Labs  Lab 04/20/23 1422 04/21/23 0248 04/22/23 0500  NA 134* 129* 135  K 4.1 3.2* 4.4  CL 97* 97* 105  CO2 24 23 24   GLUCOSE 135* 127* 112*  BUN 31* 29* 17  CREATININE 1.94* 1.56* 1.19*  CALCIUM 9.6 8.4* 8.8*  MG 2.1  --   --   PROT 6.8  --   --   ALBUMIN 3.9  --   --   AST 24  --   --   ALT 15  --   --   ALKPHOS 85  --   --   BILITOT 1.4*  --   --   GFRNONAA 26* 34* 47*  ANIONGAP 13 9 6     Lipids No results for input(s): "CHOL", "TRIG", "HDL", "LABVLDL", "LDLCALC", "CHOLHDL" in the last 168 hours.  Hematology Recent Labs  Lab 04/20/23 1422 04/21/23 0248 04/22/23 0500  WBC 5.1 3.6* 2.8*  RBC 4.59 3.76* 3.87  HGB 15.5* 12.4 12.7  HCT 45.8 37.8 39.4  MCV 99.8 100.5* 101.8*  MCH 33.8 33.0 32.8  MCHC 33.8 32.8 32.2  RDW 14.0 14.0 14.3  PLT 76* 63* 58*   Thyroid  Recent Labs  Lab 04/20/23 1422  TSH 0.818    BNP Recent Labs  Lab 04/20/23 1743  BNP 71.0    DDimer No results for input(s): "DDIMER" in the last 168 hours.   Radiology    MR BRAIN W WO CONTRAST Result Date: 04/21/2023 CLINICAL DATA:  Provided history: Brain/CNS neoplasm, staging. Additional history obtained from electronic MEDICAL RECORD NUMBERHistory of  Hodgkin's lymphoma. EXAM: MRI HEAD WITHOUT AND WITH CONTRAST TECHNIQUE: Multiplanar, multiecho pulse sequences of the brain and surrounding structures were obtained without and with intravenous contrast. CONTRAST:  10mL GADAVIST GADOBUTROL 1 MMOL/ML IV SOLN COMPARISON:  Head CT 04/20/2023. Brain MRI 04/27/2020. FINDINGS: Intermittently motion degraded examination. Most notably, the coronal T2 sequence is moderate-to-severely motion degraded. Within this limitation, findings are as follows. Brain: Mild generalized cerebral atrophy. 2.6 x 1.4 x 1.8 cm homogeneously enhancing mass centered within the left aspect of the callosal splenium. Corresponding diffusion weighted signal abnormality. Mild associated edema. Mild partial effacement of the posterior body and atrium of the left lateral ventricle. Smooth abnormal enhancement is also noted along portions of both lateral ventricles (most notably the frontal horns)(for instance as seen on series 18, image 12). Mild multifocal T2 FLAIR hyperintense signal abnormality elsewhere within the cerebral white matter, nonspecific but compatible chronic small vessel ischemic disease. There is no acute infarct. No chronic intracranial blood products. No extra-axial fluid collection. No midline shift or hydrocephalus. Vascular: Maintained flow voids within the proximal large arterial vessels. Skull and upper cervical spine: No focal worrisome marrow lesion. Incompletely assessed cervical spondylosis. Sinuses/Orbits: No mass or acute finding within the imaged orbits. No significant paranasal sinus disease. IMPRESSION: 1. Intermittently motion degraded exam. 2. 2.6 x 1.4 x 1.8 cm enhancing mass centered within the left aspect of the callosal splenium with mild associated edema and mild effacement of the left lateral ventricle. Additionally, smooth abnormal enhancement is also noted along portions of both lateral ventricles (most notably the frontal horns). Lymphoma is favored.  However, alternative considerations include a primary CNS neoplasm or metastasis with intraventricular dissemination of disease. If not already performed, consider a lumbar puncture for CSF sampling. Additionally, consider a total spine MRI (with and without contrast) for further evaluation. 3. Background parenchymal atrophy and mild cerebral white matter chronic small vessel ischemic disease. Electronically Signed   By: Jackey Loge D.O.   On: 04/21/2023 18:13   CT HEAD WO CONTRAST ( ) Result Date: 04/21/2023 CLINICAL DATA:  Initial evaluation for delirium. EXAM: CT HEAD WITHOUT CONTRAST TECHNIQUE: Contiguous axial images were obtained from the base of the skull through the vertex without intravenous contrast.  RADIATION DOSE REDUCTION: This exam was performed according to the departmental dose-optimization program which includes automated exposure control, adjustment of the mA and/or kV according to patient size and/or use of iterative reconstruction technique. COMPARISON:  None Available. FINDINGS: Brain: Cerebral volume within normal limits. No acute intracranial hemorrhage. No acute large vessel territory infarct. Well-circumscribed ovoid hyperdense mildly expansile mass positioned at the left aspect of the splenium measures up to 2.7 cm (series 2, image 17). Mild surrounding edema without significant regional mass effect. This appears intra-axial in location. No other mass lesion, mass effect or midline shift. No hydrocephalus or extra-axial fluid collection. Vascular: No abnormal hyperdense vessel. Skull: Scalp soft tissues within normal limits.  Calvarium intact. Sinuses/Orbits: Globes orbital soft tissues within normal limits. Paranasal sinuses are clear. No mastoid effusion. Other: None. IMPRESSION: 1. 2.7 cm hyperdense intra-axial mass positioned at the left aspect of the splenium. Mild surrounding edema without significant regional mass effect. Finding is nonspecific, but concerning for a neoplastic  process, with lymphoma a consideration given patient history as well as the periventricular location. Further evaluation with dedicated brain MRI, with and without contrast, recommended for further evaluation. 2. No other acute intracranial abnormality. Electronically Signed   By: Rise Mu M.D.   On: 04/21/2023 00:17   DG Chest Portable 1 View Result Date: 04/20/2023 CLINICAL DATA:  Shortness of breath.  Fatigue. EXAM: PORTABLE CHEST 1 VIEW COMPARISON:  Radiograph 02/04/2023 FINDINGS: Right chest port in place. Mild cardiomegaly, stable. Mediastinal contours are unchanged. Subsegmental atelectasis or scarring at the left lung base. No acute airspace disease, pleural effusion, pulmonary edema or pneumothorax. IMPRESSION: Mild chronic cardiomegaly. Subsegmental atelectasis or scarring at the left lung base. Electronically Signed   By: Narda Rutherford M.D.   On: 04/20/2023 16:14    Cardiac Studies   TEE 01/2023: 1. Left ventricular ejection fraction, by estimation, is 55 to 60%. The  left ventricle has normal function.   2. Right ventricular systolic function is normal. The right ventricular  size is normal. There is mildly elevated pulmonary artery systolic  pressure. The estimated right ventricular systolic pressure is 42.5 mmHg.   3. Prior to the case, patent left atrial appendage. 2D maximal diameter  22 mm. 2D maximal diamter 23 mm.      A transeptal pucture was performed.      A 27 mm Watchman FLX Pro was placed with no leak. Small mitral  shoulder noted. Average compression ~ 20%. Successful placement.      After procedure, unchanged, trivial pericardial effusion.      There is an iatrogentic ASD; though predominantly left to right  shunting, shunting is bidirectional.. Left atrial size was severely  dilated. No left atrial/left atrial appendage thrombus was detected.   4. Right atrial size was severely dilated.   5. The mitral valve is normal in structure. Mild to moderate  mitral valve  regurgitation. No evidence of mitral stenosis.   6. Severe functional tricuspid regurgitation noted. No hepatic flow  versal under general anesthesia. Jet is most prominent between  anterior-posterior and anterior-septal. Tricuspid valve regurgitation is  severe.   7. The aortic valve is tricuspid. Aortic valve regurgitation is not  visualized. No aortic stenosis is present.   8. Cannot exclude a small PFO.    Patient Profile     78 y.o. female with permanent atrial fibrillation status post Watchman device, CKD 3A, hypertension, hyperlipidemia, MDS, large B-cell lymphoma status post chemotherapy now in remission, depression admitted with atrial fibrillation  with RVR in the setting of urinary tract infection:  Assessment & Plan    # Permanent atrial fibrillation:  She has chronic atrial fibrillation.  She is unaware of being in A-fib right now.  Rates are typically well-controlled on diltiazem.  Rate remains uncontrolled on diltiazem 15mg /hr.  Will add metoprolol 25mg  po bid givent hat BP is better today.  On clopidogrel x6 months post Watchman.   # UTI: Management per primary team.   # Mild to moderate mitral digitation: # Severe tricuspid regurgitation: Continue to monitor.  Not volume overloaded at this time.   # Hypertension: Home oral diltiazem is on hold while she is on a diltiazem infusion.  Adding metoprolol as above.    # Hyperlipidemia: Continue pravastatin.   # Brain mass: Being evaluated by oncology.  She has a history of lymphoma thought to be in remission.       For questions or updates, please contact [facility] Please consult [email or web address] for contact info under        Signed, Chilton Si, MD  04/22/2023, 9:53 AM

## 2023-04-22 NOTE — Progress Notes (Addendum)
 [name] [name]   DOB:23-Feb-1945   WU#:[id number]      ASSESSMENT & PLAN:  Non-Hodgkin's lymphoma Large B cell - Previously in remission, now with suspected recurrence -Diagnosed 08/2020.  Status post treatment with R-CHOP, Udenyca in 2023. - CT head done 04/20/2023 shows 2.7 hyperdense intra axial mass left aspect of splenium.  Finding is nonspecific but concerning for neoplastic process given history of lymphoma. -MRI brain done 04/21/2023 shows 2.6 x 1.4 x 1.8 cm enhancing mass, lymphoma favored although alternative including primary CNS neoplasm or mets cannot be excluded. - LP for CSF sampling is recommended.  Discussed today with patient with family at bedside, patient is refusing stating she is "tired".  Patient's family trying to convince her to proceed and at this time she is adamant in her refusal. - Consider palliative for goals of care discussion. - Medical oncology/Dr. Truett Perna following   Fatigue/generalized Weakness Confusion - Patient with multiple comorbidities.  Admitted 04/20/2023 with complaints of generalized weakness and fatigue.  Family states patient has been intermittently confused for several days. - States she feels okay and denies acute complaints.   Thrombocytopenia - Chronic, baseline in 100 range - Etiology unclear - Platelets continue to drop 58K today. - Transfuse platelets for counts <20 K or <50 K with active bleeding.  No transfusional intervention required at this time. - Continue to monitor CBC with differential   History of coagulopathy - Was on apixaban, now with Watchman   Chronic A-fib -Status post recent Watchman implant 03/2023 -Critical care and cardiology teams following   Hypertension Hyperlipidemia -Continue to monitor blood pressure and administer antihypertensives - On statin - Monitor labs   CKD - Elevated creatinine and BUN - Continue gentle hydration - Monitor CMP    Code Status Full  Subjective:  Patient seen awake alert and  oriented laying in bed.  Multiple family members at bedside.  Discussed with patient our recommendations for LP procedure.  Patient refusing stating she is "tired".  Patient's family tried to convince her and she is still saying no to procedure.  Denies pain or other acute complaints.  Objective:  Vitals:   04/22/23 0918 04/22/23 0920  BP: (!) 139/56   Pulse:  (!) 110  Resp:  (!) 23  Temp:    SpO2:       Intake/Output Summary (Last 24 hours) at 04/22/2023 1058 Last data filed at 04/22/2023 0912 Gross per 24 hour  Intake 1376.02 ml  Output 500 ml  Net 876.02 ml     PHYSICAL EXAMINATION: ECOG PERFORMANCE STATUS: 4 - Bedbound  Vitals:   04/22/23 0918 04/22/23 0920  BP: (!) 139/56   Pulse:  (!) 110  Resp:  (!) 23  Temp:    SpO2:     Filed Weights   04/20/23 2140  Weight: 219 lb 12.8 oz (99.7 kg)    GENERAL: alert, no distress and comfortable SKIN: + Pale skin color, texture, turgor are normal, yeast rash at the left breast EYES: normal, conjunctiva are pink and non-injected, sclera clear OROPHARYNX: no exudate, no erythema and lips, buccal mucosa, and tongue normal  NECK: supple, thyroid normal size, non-tender, without nodularity LYMPH: no palpable lymphadenopathy in the cervical, axillary or inguinal LUNGS: clear to auscultation and percussion with normal breathing effort HEART: regular rate & rhythm and no murmurs and no lower extremity edema ABDOMEN: abdomen soft, non-tender and normal bowel sounds MUSCULOSKELETAL: no cyanosis of digits and no clubbing  PSYCH: alert & oriented x 3 with fluent  speech NEURO: no focal motor/sensory deficits, oriented to place and year   All questions were answered. The patient knows to call the clinic with any problems, questions or concerns.   The total time spent in the appointment was 40 minutes encounter with patient including review of chart and various tests results, discussions about plan of care and coordination of care  plan  Dawson Bills, NP 04/22/2023 10:58 AM    Labs Reviewed:  Lab Results  Component Value Date   WBC 2.8 (L) 04/22/2023   HGB 12.7 04/22/2023   HCT 39.4 04/22/2023   MCV 101.8 (H) 04/22/2023   PLT 58 (L) 04/22/2023   Recent Labs    08/11/22 0848 01/11/23 0937 03/09/23 0909 04/20/23 1422 04/21/23 0248 04/22/23 0500  NA 141 138   < > 134* 129* 135  K 4.1 4.3   < > 4.1 3.2* 4.4  CL 108 104   < > 97* 97* 105  CO2 28 29   < > 24 23 24   GLUCOSE 117* 123*   < > 135* 127* 112*  BUN 15 23   < > 31* 29* 17  CREATININE 1.16* 1.25*   < > 1.94* 1.56* 1.19*  CALCIUM 9.0 9.1   < > 9.6 8.4* 8.8*  GFRNONAA 49* 45*   < > 26* 34* 47*  PROT 6.2* 6.2*  --  6.8  --   --   ALBUMIN 4.0 3.9  --  3.9  --   --   AST 16 13*  --  24  --   --   ALT 11 13  --  15  --   --   ALKPHOS 73 72  --  85  --   --   BILITOT 0.4 0.5  --  1.4*  --   --    < > = values in this interval not displayed.    Studies Reviewed:  MR BRAIN W WO CONTRAST Result Date: 04/21/2023 CLINICAL DATA:  Provided history: Brain/CNS neoplasm, staging. Additional history obtained from electronic MEDICAL RECORD NUMBERHistory of Hodgkin's lymphoma. EXAM: MRI HEAD WITHOUT AND WITH CONTRAST TECHNIQUE: Multiplanar, multiecho pulse sequences of the brain and surrounding structures were obtained without and with intravenous contrast. CONTRAST:  10mL GADAVIST GADOBUTROL 1 MMOL/ML IV SOLN COMPARISON:  Head CT 04/20/2023. Brain MRI 04/27/2020. FINDINGS: Intermittently motion degraded examination. Most notably, the coronal T2 sequence is moderate-to-severely motion degraded. Within this limitation, findings are as follows. Brain: Mild generalized cerebral atrophy. 2.6 x 1.4 x 1.8 cm homogeneously enhancing mass centered within the left aspect of the callosal splenium. Corresponding diffusion weighted signal abnormality. Mild associated edema. Mild partial effacement of the posterior body and atrium of the left lateral ventricle. Smooth abnormal  enhancement is also noted along portions of both lateral ventricles (most notably the frontal horns)(for instance as seen on series 18, image 12). Mild multifocal T2 FLAIR hyperintense signal abnormality elsewhere within the cerebral white matter, nonspecific but compatible chronic small vessel ischemic disease. There is no acute infarct. No chronic intracranial blood products. No extra-axial fluid collection. No midline shift or hydrocephalus. Vascular: Maintained flow voids within the proximal large arterial vessels. Skull and upper cervical spine: No focal worrisome marrow lesion. Incompletely assessed cervical spondylosis. Sinuses/Orbits: No mass or acute finding within the imaged orbits. No significant paranasal sinus disease. IMPRESSION: 1. Intermittently motion degraded exam. 2. 2.6 x 1.4 x 1.8 cm enhancing mass centered within the left aspect of the callosal splenium with mild associated edema  and mild effacement of the left lateral ventricle. Additionally, smooth abnormal enhancement is also noted along portions of both lateral ventricles (most notably the frontal horns). Lymphoma is favored. However, alternative considerations include a primary CNS neoplasm or metastasis with intraventricular dissemination of disease. If not already performed, consider a lumbar puncture for CSF sampling. Additionally, consider a total spine MRI (with and without contrast) for further evaluation. 3. Background parenchymal atrophy and mild cerebral white matter chronic small vessel ischemic disease. Electronically Signed   By: Jackey Loge D.O.   On: 04/21/2023 18:13   CT HEAD WO CONTRAST ( ) Result Date: 04/21/2023 CLINICAL DATA:  Initial evaluation for delirium. EXAM: CT HEAD WITHOUT CONTRAST TECHNIQUE: Contiguous axial images were obtained from the base of the skull through the vertex without intravenous contrast. RADIATION DOSE REDUCTION: This exam was performed according to the departmental dose-optimization program  which includes automated exposure control, adjustment of the mA and/or kV according to patient size and/or use of iterative reconstruction technique. COMPARISON:  None Available. FINDINGS: Brain: Cerebral volume within normal limits. No acute intracranial hemorrhage. No acute large vessel territory infarct. Well-circumscribed ovoid hyperdense mildly expansile mass positioned at the left aspect of the splenium measures up to 2.7 cm (series 2, image 17). Mild surrounding edema without significant regional mass effect. This appears intra-axial in location. No other mass lesion, mass effect or midline shift. No hydrocephalus or extra-axial fluid collection. Vascular: No abnormal hyperdense vessel. Skull: Scalp soft tissues within normal limits.  Calvarium intact. Sinuses/Orbits: Globes orbital soft tissues within normal limits. Paranasal sinuses are clear. No mastoid effusion. Other: None. IMPRESSION: 1. 2.7 cm hyperdense intra-axial mass positioned at the left aspect of the splenium. Mild surrounding edema without significant regional mass effect. Finding is nonspecific, but concerning for a neoplastic process, with lymphoma a consideration given patient history as well as the periventricular location. Further evaluation with dedicated brain MRI, with and without contrast, recommended for further evaluation. 2. No other acute intracranial abnormality. Electronically Signed   By: Rise Mu M.D.   On: 04/21/2023 00:17   DG Chest Portable 1 View Result Date: 04/20/2023 CLINICAL DATA:  Shortness of breath.  Fatigue. EXAM: PORTABLE CHEST 1 VIEW COMPARISON:  Radiograph 02/04/2023 FINDINGS: Right chest port in place. Mild cardiomegaly, stable. Mediastinal contours are unchanged. Subsegmental atelectasis or scarring at the left lung base. No acute airspace disease, pleural effusion, pulmonary edema or pneumothorax. IMPRESSION: Mild chronic cardiomegaly. Subsegmental atelectasis or scarring at the left lung base.  Electronically Signed   By: Narda Rutherford M.D.   On: 04/20/2023 16:14   CT CARDIAC MORPH/PULM VEIN W/CM&W/O CA SCORE Addendum Date: 04/17/2023 ADDENDUM REPORT: 04/17/2023 02:57 EXAM: OVER-READ INTERPRETATION  CT CHEST The following report is an over-read performed by radiologist Dr. Alcide Clever of [facility] on 04/17/2023. This over-read does not include interpretation of cardiac or coronary anatomy or pathology. The coronary calcium score/coronary CTA interpretation by the cardiologist is attached. COMPARISON:  None. FINDINGS: Cardiovascular: No significant atherosclerotic calcifications are noted. Left atrial appendage occlusion device is seen. Central catheter is noted as well. Mediastinum/Nodes: There are no enlarged lymph nodes within the visualized mediastinum. Lungs/Pleura: There is no pleural effusion. The visualized lungs appear clear. Upper abdomen: No significant findings in the visualized upper abdomen. Musculoskeletal/Chest wall: No chest wall mass or suspicious osseous findings within the visualized chest. IMPRESSION: No significant extracardiac findings within the visualized chest. Electronically Signed   By: Alcide Clever M.D.   On: 04/17/2023 02:57  Result Date: 04/17/2023 CLINICAL DATA:  Follow up for a Watchman Flex Device EXAM: Cardiac CT/CTA TECHNIQUE: The patient was scanned on a Siemens Somatom scanner. FINDINGS: A 120 kV prospective scan was triggered in the descending thoracic aorta at 111 HU's. Gantry rotation speed was 280 msecs and collimation was .9 mm. No beta blockade and no NTG was given. The 3D data set was reconstructed in 5% intervals of the 60-80 % of the R-R cycle. Systolic phases were analyzed on a dedicated work station using MPR, MIP and VRT modes. The patient received 95 cc of contrast. Device Position and stability Size: 27 mm Position: No evidence of device migration or embolization Average compression: 15 % Mitral shoulder: Present, less then 1/2 shoulder  Occlusion Assessment Complete occlusion of the device Presence of contrast in the distal appendage to the device suggesting incomplete endothelialization Peridevice leak: Absent There is no evidence of device related thrombus. Interatrial septum: No communications. Non-appendage cardiac findings: Aorta:  Normal caliber.  No dissection.  No calcification. Main pulmonary artery: Mild dilation 30 mm Aortic Valve:  Tri-leaflet. No calcification. Chamber dimensions: Severe right atrial dilation. Venous drainage: Normal pulmonary vein anatomy. Valve assessment: Mitral annular calcification noted. There is a catheter that terminates in the right atrium. Coronary Arteries: Normal coronary origin. The study was performed without use of NTG and insufficient for plaque evaluation. Coronary Calcium Score: 414; increased from 2023. Extra-cardiac findings: See attached radiology report for non-cardiac structures. IMPRESSION: 1. Successful left atrial appendage occlusion with complete seal. 2. Absence of peridevice leak. 3. Absence of device-related thrombus. 4. Stable device position without evidence of migration or embolization. Riley Lam, MD Electronically Signed: By: Riley Lam M.D. On: 04/07/2023 15:20  Ms. Costanza was interviewed and examined.  She appears unchanged.  I reviewed the brain CT and MRI images.  I discussed the results with Ms. Cuttino.  The clinical presentation and findings are most consistent with CNS recurrence of non-Hodgkin's lymphoma.  The differential diagnosis includes a primary brain tumor or metastatic disease from another tumor site.  I recommend imaging of the spine and a diagnostic lumbar puncture.  Ms. Capley is unsure whether she would like to proceed with additional evaluation.  She plans to speak with her family.  We discussed treatment options if she has CNS lymphoma including palliative radiation, high-dose chemotherapy, and palliative  Decadron.  Recommendations: Diagnostic lumbar puncture Staging MRIs of the cervical, thoracic, and lumbar spine Begin trial of palliative Decadron if she declines the lumbar puncture and staging MRIs I will see her at approximately 7 AM on 04/22/2023.

## 2023-04-23 ENCOUNTER — Inpatient Hospital Stay (HOSPITAL_COMMUNITY)

## 2023-04-23 ENCOUNTER — Encounter (HOSPITAL_COMMUNITY): Payer: Self-pay | Admitting: Internal Medicine

## 2023-04-23 DIAGNOSIS — Z66 Do not resuscitate: Secondary | ICD-10-CM | POA: Diagnosis not present

## 2023-04-23 DIAGNOSIS — Z515 Encounter for palliative care: Secondary | ICD-10-CM

## 2023-04-23 DIAGNOSIS — Z7189 Other specified counseling: Secondary | ICD-10-CM

## 2023-04-23 DIAGNOSIS — C8510 Unspecified B-cell lymphoma, unspecified site: Secondary | ICD-10-CM

## 2023-04-23 DIAGNOSIS — I4891 Unspecified atrial fibrillation: Secondary | ICD-10-CM | POA: Diagnosis not present

## 2023-04-23 DIAGNOSIS — I482 Chronic atrial fibrillation, unspecified: Secondary | ICD-10-CM | POA: Diagnosis not present

## 2023-04-23 LAB — CSF CELL COUNT WITH DIFFERENTIAL
Eosinophils, CSF: 0 % (ref 0–1)
Lymphs, CSF: 84 % — ABNORMAL HIGH (ref 40–80)
Monocyte-Macrophage-Spinal Fluid: 16 % (ref 15–45)
RBC Count, CSF: 1 /mm3 — ABNORMAL HIGH
Segmented Neutrophils-CSF: 0 % (ref 0–6)
Tube #: 4
WBC, CSF: 22 /mm3 (ref 0–5)

## 2023-04-23 LAB — CBC
HCT: 36.5 % (ref 36.0–46.0)
Hemoglobin: 12 g/dL (ref 12.0–15.0)
MCH: 33.1 pg (ref 26.0–34.0)
MCHC: 32.9 g/dL (ref 30.0–36.0)
MCV: 100.6 fL — ABNORMAL HIGH (ref 80.0–100.0)
Platelets: 72 10*3/uL — ABNORMAL LOW (ref 150–400)
RBC: 3.63 MIL/uL — ABNORMAL LOW (ref 3.87–5.11)
RDW: 14.2 % (ref 11.5–15.5)
WBC: 2.9 10*3/uL — ABNORMAL LOW (ref 4.0–10.5)
nRBC: 0 % (ref 0.0–0.2)

## 2023-04-23 LAB — URINE CULTURE: Culture: 20000 — AB

## 2023-04-23 LAB — PROTEIN AND GLUCOSE, CSF
Glucose, CSF: 84 mg/dL — ABNORMAL HIGH (ref 40–70)
Total  Protein, CSF: 86 mg/dL — ABNORMAL HIGH (ref 15–45)

## 2023-04-23 MED ORDER — LORAZEPAM 2 MG/ML IJ SOLN
1.0000 mg | Freq: Once | INTRAMUSCULAR | Status: AC
  Administered 2023-04-23: 1 mg via INTRAVENOUS
  Filled 2023-04-23: qty 1

## 2023-04-23 MED ORDER — DILTIAZEM HCL ER COATED BEADS 180 MG PO CP24
300.0000 mg | ORAL_CAPSULE | Freq: Every day | ORAL | Status: DC
  Administered 2023-04-23 – 2023-04-25 (×3): 300 mg via ORAL
  Filled 2023-04-23 (×3): qty 1

## 2023-04-23 MED ORDER — GADOBUTROL 1 MMOL/ML IV SOLN: 10.0000 mL | Freq: Once | INTRAVENOUS | Status: DC | PRN

## 2023-04-23 MED ORDER — SENNOSIDES-DOCUSATE SODIUM 8.6-50 MG PO TABS
1.0000 | ORAL_TABLET | Freq: Two times a day (BID) | ORAL | Status: DC
  Administered 2023-04-23 – 2023-04-25 (×5): 1 via ORAL
  Filled 2023-04-23 (×5): qty 1

## 2023-04-23 MED ORDER — TRAZODONE HCL 50 MG PO TABS
50.0000 mg | ORAL_TABLET | Freq: Once | ORAL | Status: AC
  Administered 2023-04-24: 50 mg via ORAL
  Filled 2023-04-23: qty 1

## 2023-04-23 MED ORDER — SODIUM CHLORIDE 0.9 % IV BOLUS
250.0000 mL | Freq: Once | INTRAVENOUS | Status: AC
  Administered 2023-04-23: 250 mL via INTRAVENOUS

## 2023-04-23 MED ORDER — FOSFOMYCIN TROMETHAMINE 3 G PO PACK: 3.0000 g | PACK | Freq: Once | ORAL | Status: DC

## 2023-04-23 NOTE — Progress Notes (Signed)
 I received a consult to provide support as well as assistance with advance directives documents.  Joelene had some visitors, including grandchildren present with her and asked if I could return later.  Her daughter asked for me to review the documents with her briefly.  Toluwanimi is confused at the moment as well and I explained that we could not complete legal documents if she is not oriented. Loree and the family are in agreement that Zyan's daughter who is a Engineer, civil (consulting) would be primary Management consultant.  I reassured her that if there is not conflict in the family about course of treatment, the document is not essential.  If Monya has moments were she is more oriented, we will attempt to get documents notarized.

## 2023-04-23 NOTE — Plan of Care (Signed)
  Problem: Clinical Measurements: Goal: Ability to maintain clinical measurements within normal limits will improve Outcome: Progressing   Problem: Activity: Goal: Risk for activity intolerance will decrease Outcome: Progressing   Problem: Coping: Goal: Level of anxiety will decrease Outcome: Not Progressing   Problem: Safety: Goal: Ability to remain free from injury will improve Outcome: Not Progressing

## 2023-04-23 NOTE — Progress Notes (Addendum)
 [name] [name]   DOB:[birth year]   QI#:[id number]      ASSESSMENT & PLAN:  Non-Hodgkin's lymphoma Large B cell - Previously in remission, now with suspected recurrence -Diagnosed 08/2020.  Status post treatment with R-CHOP, Udenyca in 2023. - CT head done 04/20/2023 shows 2.7 hyperdense intra axial mass left aspect of splenium.  Finding is nonspecific but concerning for neoplastic process given history of lymphoma. -MRI brain done 04/21/2023 shows 2.6 x 1.4 x 1.8 cm enhancing mass, lymphoma favored although alternative including primary CNS neoplasm or mets cannot be excluded. - Diagnostic LP for CSF sampling is recommended, fluid needs to be sent for cell count, protein, glucose, and cytology.  Patient initially refused, however is now agreeable.  Although she remains unsure of whether she will proceed with cancer treatment if deemed necessary.  - Recommend staging MRIs of cervical, thoracic and lumbar spine if patient can tolerate. - Consider palliative for goals of care discussion. - Medical oncology/Dr. Truett Perna following   Fatigue/generalized Weakness Confusion - Patient with multiple comorbidities.  Admitted 04/20/2023 with complaints of generalized weakness and fatigue.  Family states patient has been intermittently confused for several days. - States she feels okay and denies acute complaints.   Thrombocytopenia - Chronic, baseline in 100 range - Etiology unclear - Platelets improving slightly to 72k today - Transfuse platelets for counts <20 K or <50 K with active bleeding.  No transfusional intervention required at this time. - Continue to monitor CBC with differential   History of coagulopathy - Was on apixaban, now with Watchman   Chronic A-fib -Status post recent Watchman implant 03/2023 -Critical care and cardiology teams following   Hypertension Hyperlipidemia -Continue to monitor blood pressure and administer antihypertensives - On statin - Monitor labs   CKD - Elevated  creatinine and BUN - Continue gentle hydration - Monitor CMP      Code Status Full  Subjective:  Patient leaving for lumbar puncture, alert and oriented.  No acute distress noted.  Family at bedside.   Objective:  Vitals:   04/23/23 0400 04/23/23 0720  BP: 112/65 115/77  Pulse: 95 91  Resp: 19 (!) 24  Temp:    SpO2: (!) 86% 92%     Intake/Output Summary (Last 24 hours) at 04/23/2023 0946 Last data filed at 04/23/2023 1324 Gross per 24 hour  Intake 646.22 ml  Output 350 ml  Net 296.22 ml     PHYSICAL EXAMINATION: ECOG PERFORMANCE STATUS: 4 - Bedbound  Vitals:   04/23/23 0400 04/23/23 0720  BP: 112/65 115/77  Pulse: 95 91  Resp: 19 (!) 24  Temp:    SpO2: (!) 86% 92%   Filed Weights   04/20/23 2140  Weight: 219 lb 12.8 oz (99.7 kg)    GENERAL: alert, no distress and comfortable SKIN: skin color, texture, turgor are normal, no rashes or significant lesions EYES: normal, conjunctiva are pink and non-injected, sclera clear OROPHARYNX: no exudate, no erythema and lips, buccal mucosa, and tongue normal  NECK: supple, thyroid normal size, non-tender, without nodularity LYMPH: no palpable lymphadenopathy in the cervical, axillary or inguinal LUNGS: clear to auscultation and percussion with normal breathing effort HEART: regular rate & rhythm and no murmurs and no lower extremity edema ABDOMEN: abdomen soft, non-tender and normal bowel sounds MUSCULOSKELETAL: no cyanosis of digits and no clubbing  PSYCH: alert & oriented x 3 with fluent speech NEURO: no focal motor/sensory deficits   All questions were answered. The patient knows to call the clinic with any  problems, questions or concerns.   The total time spent in the appointment was 40 minutes encounter with patient including review of chart and various tests results, discussions about plan of care and coordination of care plan  Dawson Bills, NP 04/23/2023 9:46 AM    Labs Reviewed:  Lab Results  Component  Value Date   WBC 2.9 (L) 04/23/2023   HGB 12.0 04/23/2023   HCT 36.5 04/23/2023   MCV 100.6 (H) 04/23/2023   PLT 72 (L) 04/23/2023   Recent Labs    08/11/22 0848 01/11/23 0937 03/09/23 0909 04/20/23 1422 04/21/23 0248 04/22/23 0500  NA 141 138   < > 134* 129* 135  K 4.1 4.3   < > 4.1 3.2* 4.4  CL 108 104   < > 97* 97* 105  CO2 28 29   < > 24 23 24   GLUCOSE 117* 123*   < > 135* 127* 112*  BUN 15 23   < > 31* 29* 17  CREATININE 1.16* 1.25*   < > 1.94* 1.56* 1.19*  CALCIUM 9.0 9.1   < > 9.6 8.4* 8.8*  GFRNONAA 49* 45*   < > 26* 34* 47*  PROT 6.2* 6.2*  --  6.8  --   --   ALBUMIN 4.0 3.9  --  3.9  --   --   AST 16 13*  --  24  --   --   ALT 11 13  --  15  --   --   ALKPHOS 73 72  --  85  --   --   BILITOT 0.4 0.5  --  1.4*  --   --    < > = values in this interval not displayed.    Studies Reviewed:  MR BRAIN W WO CONTRAST Result Date: 04/21/2023 CLINICAL DATA:  Provided history: Brain/CNS neoplasm, staging. Additional history obtained from electronic MEDICAL RECORD NUMBERHistory of Hodgkin's lymphoma. EXAM: MRI HEAD WITHOUT AND WITH CONTRAST TECHNIQUE: Multiplanar, multiecho pulse sequences of the brain and surrounding structures were obtained without and with intravenous contrast. CONTRAST:  10mL GADAVIST GADOBUTROL 1 MMOL/ML IV SOLN COMPARISON:  Head CT 04/20/2023. Brain MRI 04/27/2020. FINDINGS: Intermittently motion degraded examination. Most notably, the coronal T2 sequence is moderate-to-severely motion degraded. Within this limitation, findings are as follows. Brain: Mild generalized cerebral atrophy. 2.6 x 1.4 x 1.8 cm homogeneously enhancing mass centered within the left aspect of the callosal splenium. Corresponding diffusion weighted signal abnormality. Mild associated edema. Mild partial effacement of the posterior body and atrium of the left lateral ventricle. Smooth abnormal enhancement is also noted along portions of both lateral ventricles (most notably the frontal  horns)(for instance as seen on series 18, image 12). Mild multifocal T2 FLAIR hyperintense signal abnormality elsewhere within the cerebral white matter, nonspecific but compatible chronic small vessel ischemic disease. There is no acute infarct. No chronic intracranial blood products. No extra-axial fluid collection. No midline shift or hydrocephalus. Vascular: Maintained flow voids within the proximal large arterial vessels. Skull and upper cervical spine: No focal worrisome marrow lesion. Incompletely assessed cervical spondylosis. Sinuses/Orbits: No mass or acute finding within the imaged orbits. No significant paranasal sinus disease. IMPRESSION: 1. Intermittently motion degraded exam. 2. 2.6 x 1.4 x 1.8 cm enhancing mass centered within the left aspect of the callosal splenium with mild associated edema and mild effacement of the left lateral ventricle. Additionally, smooth abnormal enhancement is also noted along portions of both lateral ventricles (most notably the frontal horns).  Lymphoma is favored. However, alternative considerations include a primary CNS neoplasm or metastasis with intraventricular dissemination of disease. If not already performed, consider a lumbar puncture for CSF sampling. Additionally, consider a total spine MRI (with and without contrast) for further evaluation. 3. Background parenchymal atrophy and mild cerebral white matter chronic small vessel ischemic disease. Electronically Signed   By: Jackey Loge D.O.   On: 04/21/2023 18:13   CT HEAD WO CONTRAST ( ) Result Date: 04/21/2023 CLINICAL DATA:  Initial evaluation for delirium. EXAM: CT HEAD WITHOUT CONTRAST TECHNIQUE: Contiguous axial images were obtained from the base of the skull through the vertex without intravenous contrast. RADIATION DOSE REDUCTION: This exam was performed according to the departmental dose-optimization program which includes automated exposure control, adjustment of the mA and/or kV according to  patient size and/or use of iterative reconstruction technique. COMPARISON:  None Available. FINDINGS: Brain: Cerebral volume within normal limits. No acute intracranial hemorrhage. No acute large vessel territory infarct. Well-circumscribed ovoid hyperdense mildly expansile mass positioned at the left aspect of the splenium measures up to 2.7 cm (series 2, image 17). Mild surrounding edema without significant regional mass effect. This appears intra-axial in location. No other mass lesion, mass effect or midline shift. No hydrocephalus or extra-axial fluid collection. Vascular: No abnormal hyperdense vessel. Skull: Scalp soft tissues within normal limits.  Calvarium intact. Sinuses/Orbits: Globes orbital soft tissues within normal limits. Paranasal sinuses are clear. No mastoid effusion. Other: None. IMPRESSION: 1. 2.7 cm hyperdense intra-axial mass positioned at the left aspect of the splenium. Mild surrounding edema without significant regional mass effect. Finding is nonspecific, but concerning for a neoplastic process, with lymphoma a consideration given patient history as well as the periventricular location. Further evaluation with dedicated brain MRI, with and without contrast, recommended for further evaluation. 2. No other acute intracranial abnormality. Electronically Signed   By: Rise Mu M.D.   On: 04/21/2023 00:17   DG Chest Portable 1 View Result Date: 04/20/2023 CLINICAL DATA:  Shortness of breath.  Fatigue. EXAM: PORTABLE CHEST 1 VIEW COMPARISON:  Radiograph 02/04/2023 FINDINGS: Right chest port in place. Mild cardiomegaly, stable. Mediastinal contours are unchanged. Subsegmental atelectasis or scarring at the left lung base. No acute airspace disease, pleural effusion, pulmonary edema or pneumothorax. IMPRESSION: Mild chronic cardiomegaly. Subsegmental atelectasis or scarring at the left lung base. Electronically Signed   By: Narda Rutherford M.D.   On: 04/20/2023 16:14   CT CARDIAC  MORPH/PULM VEIN W/CM&W/O CA SCORE Addendum Date: 04/17/2023 ADDENDUM REPORT: 04/17/2023 02:57 EXAM: OVER-READ INTERPRETATION  CT CHEST The following report is an over-read performed by radiologist Dr. Alcide Clever of [facility] on 04/17/2023. This over-read does not include interpretation of cardiac or coronary anatomy or pathology. The coronary calcium score/coronary CTA interpretation by the cardiologist is attached. COMPARISON:  None. FINDINGS: Cardiovascular: No significant atherosclerotic calcifications are noted. Left atrial appendage occlusion device is seen. Central catheter is noted as well. Mediastinum/Nodes: There are no enlarged lymph nodes within the visualized mediastinum. Lungs/Pleura: There is no pleural effusion. The visualized lungs appear clear. Upper abdomen: No significant findings in the visualized upper abdomen. Musculoskeletal/Chest wall: No chest wall mass or suspicious osseous findings within the visualized chest. IMPRESSION: No significant extracardiac findings within the visualized chest. Electronically Signed   By: Alcide Clever M.D.   On: 04/17/2023 02:57   Result Date: 04/17/2023 CLINICAL DATA:  Follow up for a Watchman Flex Device EXAM: Cardiac CT/CTA TECHNIQUE: The patient was scanned on a Siemens Somatom scanner.  FINDINGS: A 120 kV prospective scan was triggered in the descending thoracic aorta at 111 HU's. Gantry rotation speed was 280 msecs and collimation was .9 mm. No beta blockade and no NTG was given. The 3D data set was reconstructed in 5% intervals of the 60-80 % of the R-R cycle. Systolic phases were analyzed on a dedicated work station using MPR, MIP and VRT modes. The patient received 95 cc of contrast. Device Position and stability Size: 27 mm Position: No evidence of device migration or embolization Average compression: 15 % Mitral shoulder: Present, less then 1/2 shoulder Occlusion Assessment Complete occlusion of the device Presence of contrast in the  distal appendage to the device suggesting incomplete endothelialization Peridevice leak: Absent There is no evidence of device related thrombus. Interatrial septum: No communications. Non-appendage cardiac findings: Aorta:  Normal caliber.  No dissection.  No calcification. Main pulmonary artery: Mild dilation 30 mm Aortic Valve:  Tri-leaflet. No calcification. Chamber dimensions: Severe right atrial dilation. Venous drainage: Normal pulmonary vein anatomy. Valve assessment: Mitral annular calcification noted. There is a catheter that terminates in the right atrium. Coronary Arteries: Normal coronary origin. The study was performed without use of NTG and insufficient for plaque evaluation. Coronary Calcium Score: 414; increased from 2023. Extra-cardiac findings: See attached radiology report for non-cardiac structures. IMPRESSION: 1. Successful left atrial appendage occlusion with complete seal. 2. Absence of peridevice leak. 3. Absence of device-related thrombus. 4. Stable device position without evidence of migration or embolization. Riley Lam, MD Electronically Signed: By: Riley Lam M.D. On: 04/07/2023 15:20   Ms. Lucero was interviewed and examined.  Multiple family members were at the bedside when I saw her at approximately 730 this morning.  She appears stable and remains confused.  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  She and her family understand the differential diagnosis includes a primary brain tumor and metastatic disease from another tumor site.  Ms. Fister agrees to proceed with further diagnostic evaluation including a lumbar puncture and spine imaging.  She will consider options of comfort care, palliative radiation, and systemic therapy when a more definitive diagnosis is obtained.  Recommendations: Lumbar puncture with cell count and cytology MRI imaging of the cervical, thoracic, lumbar spine Management of atrial fibrillation per the medical  service and cardiology Oncology will check on her over the weekend and outpatient follow-up will be scheduled at the Cancer center

## 2023-04-23 NOTE — Progress Notes (Signed)
 Palliative DO Mims advised RN that patient communicated to DO that she was not able to continue to lay flat due to discomfort.  DO Mims raised patient's head of bed for patient comfort and notified RN.  Patient and family members aware of risk of headache and complications post lumbar puncture but advised that they are not able to lay flat at this time.   MD [facility] notified.

## 2023-04-23 NOTE — Progress Notes (Addendum)
 PROGRESS NOTE  [name] [name]  ZOX:[id number] DOB: 04-Jun-1945 DOA: 04/20/2023 PCP: Laurann Montana, MD   Brief Narrative: Patient is a 78 y.o. female with medical history significant of permanent A-fib status post Watchman implant currently not on anticoagulation, status post cardioversion in the past, non-Hodgkin's lymphoma, hypothyroidism, hyperlipidemia, anxiety, CKD stage IIIa, HFpEF, hypertension who presented from home with complaint of weakness, fatigue, confusion. On presentation, she was in rapid A-fib with heart rate in the range of 130s.  Cardiology consulted, started on Cardizem drip.  CT scan of the brain showed 2.7 cm hyperdense intra-axial mass with mild surrounding edema concerning for metastatic disease.  MRI of the brain with and without contrast showed 2.6 x 1.4 x 1.8 cm enhancing mass centered within the left aspect of the callosal splenium with mild associated edema and mild effacement of the left lateral ventricle.  Oncology consulted and following.  Plan for lumbar puncture and MRI of the spine today  Assessment & Plan:  Principal Problem:   Chronic a-fib (HCC) Active Problems:   Generalized anxiety disorder   Hyperlipidemia   Essential hypertension   (HFpEF) heart failure with preserved ejection fraction (HCC)   Hypothyroidism   Chronic kidney disease, stage 3 unspecified (HCC)   Atrial fibrillation with RVR (HCC)   AKI (acute kidney injury) (HCC)   Brain mass   A-fib with RVR: History of chronic A-fib.  CHADS2VASC score of 6. Status post Watchman implant about 3 months ago.  Status post several cardioversions, the last one on 05/15/2021.  Follows with cardiology. Started on Cardizem drip.  Cardiology  consulted.  Currently not on anticoagulation.  Takes oral Cardizem, Plavix at home.  Heart rate better today, Cardizem drip being titrated  Possible brain mets or primary CNS malignancy/confusion /history of non-Hodgkin's lymphoma: Follows with Dr. Myrle Sheng.  Status  posttreatment with chemotherapy and Rituxan.  Has chronic thrombocytopenia. CT scan of the brain showed 2.7 cm hyperdense intra-axial mass with mild surrounding edema concerning for metastatic disease.  MRI showed  2.6 x 1.4 x 1.8 cm enhancing mass centered within the left aspect of the callosal splenium with mild associated edema and mild effacement of the left lateral ventricle.  Most likely lymphoma, primary CNS manage not ruled out. plan for lumbar puncture for CSF studies and MRI of the cervical/thoracic/lumbar spine. She has leukopenia, thrombocytopenia, likely associated  with lymphoma  AKI in CKD stage IIIa: Baseline creatinine around 1.5.  Her last creatinine was 1.1.  Presented with creatinine in the range of 1.9.  Likely associated with with poor oral intake.  Improved with IV fluid.   History of OSA: Currently not on CPAP   History of anxiety/depression: On Xanax, Wellbutrin   Chronic HFpEF: Last echo showed EF of 60 to 65%.  Currently might be dehydrated. Started gentle IV fluid,now stopped. takes Lasix 20 mg as needed at home   Hypothyroidism: Continue Synthyroid   History of hyperlipidemia: On pravastatin at home  Confusion/UTI: New problem as per the family.   Likely due to metastatic lymphoma or primary CNS malignancy.urine culture showed 20,000 colonies of E. coli.  Already given 1 dose  of fosfomycin.  Currently alert and oriented  Goals of care: Elderly patient with multiple comorbidities, including CNS neoplasm, metastatic Hodgkin's lymphoma.  Palliative care also  consulted for goals of care   Generalized weakness: Will consult PT/OT when appropriate        DVT prophylaxis:enoxaparin (LOVENOX) injection 40 mg Start: 04/21/23 2200     Code  Status: Full Code  Family Communication: Daughter and husband at bedside on 4/11  Patient status: Inpatient  Patient is from : Home  Anticipated discharge to: Home  Estimated DC date: 2 to 3 days, awaiting full  workup   Consultants: Cardiology, oncology  Procedures: None  Antimicrobials:  Anti-infectives (From admission, onward)    Start     Dose/Rate Route Frequency Ordered Stop   04/21/23 0830  cefTRIAXone (ROCEPHIN) 1 g in sodium chloride 0.9 % 100 mL IVPB  Status:  Discontinued        1 g 200 mL/hr over 30 Minutes Intravenous Every 24 hours 04/21/23 0730 04/21/23 0743   04/21/23 0830  fosfomycin (MONUROL) packet 3 g        3 g Oral every 72 hours 04/21/23 0742 04/27/23 0829       Subjective: Patient seen and examined at bedside today.  Hemodynamically stable.  Very comfortable today.  Lying in bed.  Heart rate in the range of 90-110.  Stable blood pressure.  Completely alert and oriented today.  Agreeable for lumbar puncture and MRI  Objective: Vitals:   04/23/23 0130 04/23/23 0200 04/23/23 0400 04/23/23 0720  BP:  (!) 86/58 112/65 115/77  Pulse: 94 86 95 91  Resp: (!) 24 17 19  (!) 24  Temp:    97.6 F (36.4 C)  TempSrc:    Oral  SpO2: 95% 96% (!) 86% 92%  Weight:      Height:        Intake/Output Summary (Last 24 hours) at 04/23/2023 1036 Last data filed at 04/23/2023 9147 Gross per 24 hour  Intake 646.22 ml  Output 350 ml  Net 296.22 ml   Filed Weights   04/20/23 2140  Weight: 99.7 kg    Examination:   General exam: Overall comfortable, not in distress, pleasant elderly female HEENT: PERRL Respiratory system:  no wheezes or crackles  Cardiovascular system: Irregularly irregular rhythm Gastrointestinal system: Abdomen is nondistended, soft and nontender. Central nervous system: Alert and oriented Extremities: No edema, no clubbing ,no cyanosis Skin: No rashes, no ulcers,no icterus     Data Reviewed: I have personally reviewed following labs and imaging studies  CBC: Recent Labs  Lab 04/20/23 1422 04/21/23 0248 04/22/23 0500 04/23/23 0508  WBC 5.1 3.6* 2.8* 2.9*  NEUTROABS 2.9 1.7  --   --   HGB 15.5* 12.4 12.7 12.0  HCT 45.8 37.8 39.4 36.5  MCV  99.8 100.5* 101.8* 100.6*  PLT 76* 63* 58* 72*   Basic Metabolic Panel: Recent Labs  Lab 04/20/23 1422 04/21/23 0248 04/22/23 0500  NA 134* 129* 135  K 4.1 3.2* 4.4  CL 97* 97* 105  CO2 24 23 24   GLUCOSE 135* 127* 112*  BUN 31* 29* 17  CREATININE 1.94* 1.56* 1.19*  CALCIUM 9.6 8.4* 8.8*  MG 2.1  --   --      Recent Results (from the past 240 hours)  Resp panel by RT-PCR (RSV, Flu A&B, Covid) Anterior Nasal Swab     Status: None   Collection Time: 04/20/23  2:22 PM   Specimen: Anterior Nasal Swab  Result Value Ref Range Status   SARS Coronavirus 2 by RT PCR NEGATIVE NEGATIVE Final    Comment: (NOTE) SARS-CoV-2 target nucleic acids are NOT DETECTED.  The SARS-CoV-2 RNA is generally detectable in upper respiratory specimens during the acute phase of infection. The lowest concentration of SARS-CoV-2 viral copies this assay can detect is 138 copies/mL. A negative result does  not preclude SARS-Cov-2 infection and should not be used as the sole basis for treatment or other patient management decisions. A negative result may occur with  improper specimen collection/handling, submission of specimen other than nasopharyngeal swab, presence of viral mutation(s) within the areas targeted by this assay, and inadequate number of viral copies(<138 copies/mL). A negative result must be combined with clinical observations, patient history, and epidemiological information. The expected result is Negative.  Fact Sheet for Patients:  BloggerCourse.com  Fact Sheet for Healthcare Providers:  SeriousBroker.it  This test is no t yet approved or cleared by the [place] FDA and  has been authorized for detection and/or diagnosis of SARS-CoV-2 by FDA under an Emergency Use Authorization (EUA). This EUA will remain  in effect (meaning this test can be used) for the duration of the COVID-19 declaration under Section 564(b)(1) of the Act,  21 U.S.C.section 360bbb-3(b)(1), unless the authorization is terminated  or revoked sooner.       Influenza A by PCR NEGATIVE NEGATIVE Final   Influenza B by PCR NEGATIVE NEGATIVE Final    Comment: (NOTE) The Xpert Xpress SARS-CoV-2/FLU/RSV plus assay is intended as an aid in the diagnosis of influenza from Nasopharyngeal swab specimens and should not be used as a sole basis for treatment. Nasal washings and aspirates are unacceptable for Xpert Xpress SARS-CoV-2/FLU/RSV testing.  Fact Sheet for Patients: BloggerCourse.com  Fact Sheet for Healthcare Providers: SeriousBroker.it  This test is not yet approved or cleared by the [place] FDA and has been authorized for detection and/or diagnosis of SARS-CoV-2 by FDA under an Emergency Use Authorization (EUA). This EUA will remain in effect (meaning this test can be used) for the duration of the COVID-19 declaration under Section 564(b)(1) of the Act, 21 U.S.C. section 360bbb-3(b)(1), unless the authorization is terminated or revoked.     Resp Syncytial Virus by PCR NEGATIVE NEGATIVE Final    Comment: (NOTE) Fact Sheet for Patients: BloggerCourse.com  Fact Sheet for Healthcare Providers: SeriousBroker.it  This test is not yet approved or cleared by the [place] FDA and has been authorized for detection and/or diagnosis of SARS-CoV-2 by FDA under an Emergency Use Authorization (EUA). This EUA will remain in effect (meaning this test can be used) for the duration of the COVID-19 declaration under Section 564(b)(1) of the Act, 21 U.S.C. section 360bbb-3(b)(1), unless the authorization is terminated or revoked.  Performed at [facility], [address], [place], [place] [address]   MRSA Next Gen by PCR, Nasal     Status: None   Collection Time: 04/20/23 10:01 PM   Specimen: Nasal Mucosa; Nasal Swab   Result Value Ref Range Status   MRSA by PCR Next Gen NOT DETECTED NOT DETECTED Final    Comment: (NOTE) The GeneXpert MRSA Assay (FDA approved for NASAL specimens only), is one component of a comprehensive MRSA colonization surveillance program. It is not intended to diagnose MRSA infection nor to guide or monitor treatment for MRSA infections. Test performance is not FDA approved in patients less than 77 years old. Performed at [facility], [address] [place], [place] [address]   Urine Culture (for pregnant, neutropenic or urologic patients or patients with an indwelling urinary catheter)     Status: Abnormal   Collection Time: 04/21/23  7:30 AM   Specimen: Urine, Clean Catch  Result Value Ref Range Status   Specimen Description   Final    URINE, CLEAN CATCH Performed at [facility],  [address], [place], [place] [address]    Special Requests   Final    NONE Performed at [facility], [address] [place], [place] [address]    Culture 20,000 COLONIES/mL ESCHERICHIA COLI (A)  Final   Report Status 04/23/2023 FINAL  Final   Organism ID, Bacteria ESCHERICHIA COLI (A)  Final      Susceptibility   Escherichia coli - MIC*    AMPICILLIN 8 SENSITIVE Sensitive     CEFAZOLIN <=4 SENSITIVE Sensitive     CEFEPIME <=0.12 SENSITIVE Sensitive     CEFTRIAXONE <=0.25 SENSITIVE Sensitive     CIPROFLOXACIN <=0.25 SENSITIVE Sensitive     GENTAMICIN <=1 SENSITIVE Sensitive     IMIPENEM <=0.25 SENSITIVE Sensitive     NITROFURANTOIN <=16 SENSITIVE Sensitive     TRIMETH/SULFA <=20 SENSITIVE Sensitive     AMPICILLIN/SULBACTAM <=2 SENSITIVE Sensitive     PIP/TAZO <=4 SENSITIVE Sensitive ug/mL    * 20,000 COLONIES/mL ESCHERICHIA COLI     Radiology Studies: MR BRAIN W WO CONTRAST Result Date: 04/21/2023 CLINICAL DATA:  Provided history: Brain/CNS neoplasm, staging. Additional history obtained from electronic MEDICAL RECORD NUMBER History of Hodgkin's lymphoma. EXAM: MRI HEAD WITHOUT AND WITH CONTRAST TECHNIQUE: Multiplanar, multiecho pulse sequences of the brain and surrounding structures were obtained without and with intravenous contrast. CONTRAST:  10mL GADAVIST GADOBUTROL 1 MMOL/ML IV SOLN COMPARISON:  Head CT 04/20/2023. Brain MRI 04/27/2020. FINDINGS: Intermittently motion degraded examination. Most notably, the coronal T2 sequence is moderate-to-severely motion degraded. Within this limitation, findings are as follows. Brain: Mild generalized cerebral atrophy. 2.6 x 1.4 x 1.8 cm homogeneously enhancing mass centered within the left aspect of the callosal splenium. Corresponding diffusion weighted signal abnormality. Mild associated edema. Mild partial effacement of the posterior body and atrium of the left lateral ventricle. Smooth abnormal enhancement is also noted along portions of both lateral ventricles (most notably the frontal horns)(for instance as seen on series 18, image 12). Mild multifocal T2 FLAIR hyperintense signal abnormality elsewhere within the cerebral white matter, nonspecific but compatible chronic small vessel ischemic disease. There is no acute infarct. No chronic intracranial blood products. No extra-axial fluid collection. No midline shift or hydrocephalus. Vascular: Maintained flow voids within the proximal large arterial vessels. Skull and upper cervical spine: No focal worrisome marrow lesion. Incompletely assessed cervical spondylosis. Sinuses/Orbits: No mass or acute finding within the imaged orbits. No significant paranasal sinus disease. IMPRESSION: 1. Intermittently motion degraded exam. 2. 2.6 x 1.4 x 1.8 cm enhancing mass centered within the left aspect of the callosal splenium with mild associated edema and mild effacement of the left lateral ventricle. Additionally, smooth abnormal enhancement is also noted along portions of both lateral ventricles (most notably the frontal horns). Lymphoma is  favored. However, alternative considerations include a primary CNS neoplasm or metastasis with intraventricular dissemination of disease. If not already performed, consider a lumbar puncture for CSF sampling. Additionally, consider a total spine MRI (with and without contrast) for further evaluation. 3. Background parenchymal atrophy and mild cerebral white matter chronic small vessel ischemic disease. Electronically Signed   By: Jackey Loge D.O.   On: 04/21/2023 18:13    Scheduled Meds:  buPROPion  150 mg Oral Daily   Chlorhexidine Gluconate Cloth  6 each Topical Q2200   clopidogrel  75 mg Oral Daily   enoxaparin (LOVENOX) injection  40 mg Subcutaneous Q24H   fosfomycin  3 g Oral Q72H   levothyroxine  75 mcg Oral QAC breakfast  metoprolol tartrate  25 mg Oral BID   nystatin   Topical TID   polyethylene glycol  17 g Oral Daily   pravastatin  40 mg Oral QHS   senna-docusate  1 tablet Oral BID   sodium chloride flush  10-40 mL Intracatheter Q12H   Continuous Infusions:  diltiazem (CARDIZEM) infusion 12.5 mg/hr (04/23/23 0657)     LOS: 3 days   Burnadette Pop, MD [facility] P4/11/2023, 10:36 AM

## 2023-04-23 NOTE — Consult Note (Addendum)
 Consultation Note Date: 04/23/2023   Patient Name: [name] [name]  DOB: [birth year]  MRN: [id number]  Age / Sex: 78 y.o., female   PCP: Laurann Montana, MD Referring Physician: Burnadette Pop, MD  Reason for Consultation: Establishing goals of care     Chief Complaint/History of Present Illness:   Patient is a 78 year old female with a past medical history of permanent A-fib status post watchman implant not on anticoagulation, hypothyroidism, hyperlipidemia, anxiety, CKD, HFpEF, hypertension, and non-Hodgkin's lymphoma in remission who was admitted on 04/20/2023 for management of weakness, fatigue, and confusion.  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  Oncology consulted.  Concern for recurrence of lymphoma versus primary CNS tumor.  Cardiology also consulted in setting of A-fib with RVR.  Palliative medicine team consulted to assist with complex medical decision making.  Extensive review of EMR prior to presenting to bedside.  Reviewed cardiology, oncology, and hospitalist notes regarding medical care.  Personally reviewed MRI.  Reviewed recent CBC noting WBC 2.9 and platelets 72. Patient had already met with oncology provider and decided to pursue lumbar puncture this morning.  Presented to bedside to see patient after procedure.  Patient's husband and 1 daughter present at bedside.  Patient gave permission to proceed with conversation.  Able to introduce myself and the role of the palliative medicine team in patient's medical journey.  Spent time learning about patient's medical journey up into this point.  Learned about patient's status prior to presenting to the hospital.  Patient and family have heard concerns about brain mass being recurrence of lymphoma.  Patient noted she agreed to proceed with lumbar puncture to get some answers.    Spent time learning  about patient's quality of life.  Patient noted that moving forward she does want to have a long "healthy" life.  Patient discusses all that she was able to do at home up until a couple weeks ago when her symptoms burning began.  Patient described being able to be as active as possible at home and involved in activities.  Patient enjoyed spending time with family and time at home relaxing.   Inquired about desires regarding medical care moving forward.  Based on lumbar puncture results, patient will consider her options.  Patient noted that she does not want to undergo procedures that would cause her severe pain as she does not find any quality of life when she is having pain.  Discussed how palliative medicine team can assist with symptom management burden.  Daughter mentions that oncologist had mentioned possible "palliative" radiation.  Spent time explaining what "palliative" in terms of therapies and procedures would mean.  Discussed how palliative therapies are designed to allow a person more time, not cure the disease.  Noted palliative medicine team can assist with symptom management during that time while someone is undergoing these therapies. Patient acknowledged this and noted that she saw her husband go through radiation and so she would not want "palliative" radiation as it greatly affected his quality of life.  Patient noted she would rather be dead than have to go through all the side effects of radiation.  Acknowledged this and noted importance of continued conversations with providers regarding risk and benefits of therapies offered moving forward.  With permission, also able to discuss advance care planning.  Patient and her husband have been married for 60 years.  Patient notes that their  other daughter works Community education officer and has been telling them they need to complete documentation like POA and HCPOA documentation, they have just been putting it off.  Patient agrees wanting to speak with chaplain  to assist with [facility] documentation.  Did discuss that should patient not be able to make medical decisions for herself if she does not have another healthcare power of attorney deemed, decision making would initially fall to her husband.  Also discussed how ACP documentation covers other wishes regarding medical care like organ donation and life support.  Patient stated that she and her husband would not want to be on life support indefinitely.  Husband and family agreed with supporting this. Spent time explaining concern that if patient was sick enough that her heart were to stop or she were to stop breathing, interventions such as cardiac resuscitation and intubation mechanical ventilation would not lead to quality of life patient has previously described.  Patient acknowledged this.  Patient wanting her CODE STATUS changed to DNR/DNI.  Patient inquiring about forms for this, noted would complete DNR form and provide husband with this so could have at home as well.  Patient and family voiced appreciation for this.  Spent time providing emotional support via active listening.  All questions answered at that time.  Noted palliative medicine team will continue to follow with patient's medical journey.  Represented to room 2 provide husband with completed DNR form.  Patient noted that she was having worsening pain which she finds unacceptable from laying flat in bed.  Had discussed with RN and noted would raise bed up slightly to assist with pain management.  Patient and family acknowledge complications that could occur due to lifting head up of bed after lumbar puncture and agreed with proceeding with this as patient was in great discomfort.  Updated care team regarding discussions.  Primary Diagnoses  Present on Admission:  Generalized anxiety disorder  Hyperlipidemia  Essential hypertension  (HFpEF) heart failure with preserved ejection fraction (HCC)  Hypothyroidism  Chronic kidney disease, stage 3  unspecified (HCC)  Atrial fibrillation with RVR (HCC)    Past Medical History:  Diagnosis Date   (HFpEF) heart failure with preserved ejection fraction (HCC) 07/06/2016   A-fib (HCC)    paroxysmal   Acquired thrombophilia (HCC) 09/03/2021   Acute bronchitis 06/21/2020   Anemia 08/02/2020   Hgb 13.7-> 10.7 between 10/2019 and 07/2020   Anxiety    Arthritis    Benign neoplasm of colon 12/29/2011   Bilateral carpal tunnel syndrome 11/06/2015   Cataracts, bilateral    immature   Cervical spondylosis without myelopathy 11/06/2015   Chest pain 07/06/2016   Chronic cough    Chronic kidney disease, stage 3 unspecified (HCC) 03/26/2021   Chronic rhinitis 06/16/2017   CKD (chronic kidney disease) stage 3, GFR 30-59 ml/min (HCC)    Depression    Disequilibrium 08/01/2019   Drug-induced hypothyroidism 03/26/2021   Dyspnea on exertion 04/03/2016   PFT 08/02/20-mild obstruction, no resp to BD, mild reduction DLCO   Essential hypertension    Fatigue 07/06/2016   Gait instability 08/01/2019   Gastroesophageal reflux disease without esophagitis    Generalized anxiety disorder 11/25/2016   Generalized weakness 08/28/2021   Hardening of the aorta (main artery of the heart) (HCC) 03/26/2021   Hemorrhoid    History of blood transfusion 08/30/2021   while undergoing treatment for her lymphoma   History of bronchitis    couple of yrs ago   History of peristent atrial fibrillation  Hoarseness 05/12/2017   Hx of colonic polyps 05/24/2014   Hyperlipidemia    takes Pravastatin daily   Hypothyroidism 04/04/2018   Long term current use of amiodarone 10/19/2017   Loss of appetite 09/03/2021   Mitral regurgitation 02/27/2016   Moderate recurrent major depression (HCC) 09/03/2021   NHL (non-Hodgkin's lymphoma) (HCC) 09/03/2021   OSA (obstructive sleep apnea) 10/19/2017   HST 09/24/2018- AHI 30.5/ hr, desaturation to 76%, body weight 249.6 lbs Unable to tolerate CPAP, failed twice.   Other  specified disorders of bone density and structure, other site 09/03/2021   PAF (paroxysmal atrial fibrillation) (HCC) 02/17/2016   Pancytopenia (HCC) 08/29/2021   Persistent atrial fibrillation (HCC) 10/31/2016   Prediabetes 09/03/2021   Presence of Watchman left atrial appendage closure device 02/04/2023   27mm Watchman FLX Pro device placed by Dr. Lalla Brothers   Primary localized osteoarthritis of left knee 09/21/2016   Primary osteoarthritis of both first carpometacarpal joints 11/06/2015   Pruritus 10/31/2016   Rectocele 09/03/2021   Rhinitis, allergic 03/19/2017   S/P lumbar laminectomy 04/05/2013   Secondary hypercoagulable state (HCC) 04/28/2021   Sensorineural hearing loss (SNHL) of both ears 08/01/2019   Severe recurrent major depression without psychotic features (HCC) 11/25/2016   Splenomegaly 08/20/2021   Syncope 09/03/2021   Trigger finger 12/31/2022   middle finger on left and right hand   Upper airway cough syndrome 03/19/2017   Urinary urgency    Social History   Socioeconomic History   Marital status: Married    Spouse name: Jillyn Hidden   Number of children: 2   Years of education: 12   Highest education level: Some college, no degree  Occupational History   Occupation: retired  Tobacco Use   Smoking status: Never   Smokeless tobacco: Never   Tobacco comments:    Never smoke 05/22/21  Vaping Use   Vaping status: Never Used  Substance and Sexual Activity   Alcohol use: No   Drug use: No   Sexual activity: Yes    Birth control/protection: Surgical    Comment: Hysterectomy  Other Topics Concern   Not on file  Social History Narrative   Right handed   Drinks caffeine   Two story home   Social Drivers of Health   Financial Resource Strain: Low Risk  (11/25/2016)   Overall Financial Resource Strain (CARDIA)    Difficulty of Paying Living Expenses: Not very hard  Food Insecurity: No Food Insecurity (04/20/2023)   Hunger Vital Sign    Worried About Running Out  of Food in the Last Year: Never true    Ran Out of Food in the Last Year: Never true  Transportation Needs: No Transportation Needs (04/20/2023)   PRAPARE - Administrator, Civil Service (Medical): No    Lack of Transportation (Non-Medical): No  Physical Activity: Unknown (11/25/2016)   Exercise Vital Sign    Days of Exercise per Week: Patient declined    Minutes of Exercise per Session: Patient declined  Stress: Stress Concern Present (11/25/2016)   [employer] of Occupational Health - Occupational Stress Questionnaire    Feeling of Stress : Very much  Social Connections: Moderately Integrated (04/20/2023)   Social Connection and Isolation Panel [NHANES]    Frequency of Communication with Friends and Family: More than three times a week    Frequency of Social Gatherings with Friends and Family: More than three times a week    Attends Religious Services: 1 to 4 times per year  Active Member of Clubs or Organizations: No    Attends Banker Meetings: Never    Marital Status: Married   Family History  Problem Relation Age of Onset   Colon cancer Mother    Heart disease Father    Breast cancer Neg Hx    Thyroid disease Neg Hx    Scheduled Meds:  buPROPion  150 mg Oral Daily   Chlorhexidine Gluconate Cloth  6 each Topical Q2200   clopidogrel  75 mg Oral Daily   dexamethasone  4 mg Oral Q12H   enoxaparin (LOVENOX) injection  40 mg Subcutaneous Q24H   fosfomycin  3 g Oral Q72H   levothyroxine  75 mcg Oral QAC breakfast   metoprolol tartrate  25 mg Oral BID   nystatin   Topical TID   polyethylene glycol  17 g Oral Daily   pravastatin  40 mg Oral QHS   sodium chloride flush  10-40 mL Intracatheter Q12H   Continuous Infusions:  diltiazem (CARDIZEM) infusion 12.5 mg/hr (04/23/23 0657)   PRN Meds:.acetaminophen, dextromethorphan-guaiFENesin Allergies  Allergen Reactions   Omnicef [Cefdinir] Swelling and Other (See Comments)    Tongue swelling    Rituxan [Rituximab] Shortness Of Breath and Other (See Comments)    Patient given Pepcid 20mg  IV with fluids and reported feeling better. Treatment was re-started and patient was able to tolerate the rest of the treatment with no issues.    Duloxetine Other (See Comments)    "Felt funny- thinking was messed up"   Escitalopram Other (See Comments)    Makes her feel funny and sleepy   Mirtazapine Other (See Comments)    Weight gain in high doses    Penicillins Swelling and Other (See Comments)    Tongue became swollen   Peroxide [Hydrogen Peroxide] Other (See Comments)    Redness.    Simvastatin Other (See Comments)    Muscle pain   Zoloft [Sertraline Hcl] Other (See Comments)    Makes her "feel funny"   Bacitracin Rash   Bacitracin-Polymyxin B Rash   Neosporin [Neomycin-Bacitracin Zn-Polymyx] Itching, Rash and Other (See Comments)    Blisters, too   CBC:    Component Value Date/Time   WBC 2.9 (L) 04/23/2023 0508   HGB 12.0 04/23/2023 0508   HGB 14.3 01/11/2023 0937   HGB 10.7 (L) 07/31/2020 0925   HCT 36.5 04/23/2023 0508   HCT 31.3 (L) 07/31/2020 0925   PLT 72 (L) 04/23/2023 0508   PLT 163 01/11/2023 0937   PLT 104 (L) 07/31/2020 0925   MCV 100.6 (H) 04/23/2023 0508   MCV 93 07/31/2020 0925   NEUTROABS 1.7 04/21/2023 0248   LYMPHSABS 1.0 04/21/2023 0248   MONOABS 0.7 04/21/2023 0248   EOSABS 0.1 04/21/2023 0248   BASOSABS 0.0 04/21/2023 0248   Comprehensive Metabolic Panel:    Component Value Date/Time   NA 135 04/22/2023 0500   NA 139 06/13/2021 0850   K 4.4 04/22/2023 0500   CL 105 04/22/2023 0500   CO2 24 04/22/2023 0500   BUN 17 04/22/2023 0500   BUN 19 06/13/2021 0850   CREATININE 1.19 (H) 04/22/2023 0500   CREATININE 1.25 (H) 01/11/2023 0937   GLUCOSE 112 (H) 04/22/2023 0500   CALCIUM 8.8 (L) 04/22/2023 0500   AST 24 04/20/2023 1422   AST 13 (L) 01/11/2023 0937   ALT 15 04/20/2023 1422   ALT 13 01/11/2023 0937   ALKPHOS 85 04/20/2023 1422   BILITOT  1.4 (H) 04/20/2023 1422  BILITOT 0.5 01/11/2023 0937   PROT 6.8 04/20/2023 1422   PROT 6.6 06/13/2021 0850   ALBUMIN 3.9 04/20/2023 1422   ALBUMIN 3.6 (L) 06/13/2021 0850    Physical Exam: Vital Signs: BP 115/77   Pulse 91   Temp 97.8 F (36.6 C) (Oral)   Resp (!) 24   Ht 5\' 7"  (1.702 m)   Wt 99.7 kg   SpO2 92%   BMI 34.43 kg/m  SpO2: SpO2: 92 % O2 Device: O2 Device: Room Air O2 Flow Rate:   Intake/output summary:  Intake/Output Summary (Last 24 hours) at 04/23/2023 0809 Last data filed at 04/23/2023 1610 Gross per 24 hour  Intake 646.22 ml  Output 500 ml  Net 146.22 ml   LBM: Last BM Date :  (PTA) Baseline Weight: Weight: 99.7 kg Most recent weight: Weight: 99.7 kg  General: NAD, alert, chronically ill-appearing Cardiovascular: Tachycardia noted Respiratory: no increased work of breathing noted, not in respiratory distress Neuro:  awake, interactive, following commands appropriately  Psych: appropriately answers all questions          Palliative Performance Scale: 40%               Additional Data Reviewed: Recent Labs    04/21/23 0248 04/22/23 0500 04/23/23 0508  WBC 3.6* 2.8* 2.9*  HGB 12.4 12.7 12.0  PLT 63* 58* 72*  NA 129* 135  --   BUN 29* 17  --   CREATININE 1.56* 1.19*  --     Imaging: MR BRAIN W WO CONTRAST CLINICAL DATA:  Provided history: Brain/CNS neoplasm, staging. Additional history obtained from electronic MEDICAL RECORD NUMBERHistory of Hodgkin's lymphoma.  EXAM: MRI HEAD WITHOUT AND WITH CONTRAST  TECHNIQUE: Multiplanar, multiecho pulse sequences of the brain and surrounding structures were obtained without and with intravenous contrast.  CONTRAST:  10mL GADAVIST GADOBUTROL 1 MMOL/ML IV SOLN  COMPARISON:  Head CT 04/20/2023. Brain MRI 04/27/2020.  FINDINGS: Intermittently motion degraded examination. Most notably, the coronal T2 sequence is moderate-to-severely motion degraded. Within this limitation, findings are as  follows.  Brain:  Mild generalized cerebral atrophy.  2.6 x 1.4 x 1.8 cm homogeneously enhancing mass centered within the left aspect of the callosal splenium. Corresponding diffusion weighted signal abnormality. Mild associated edema. Mild partial effacement of the posterior body and atrium of the left lateral ventricle. Smooth abnormal enhancement is also noted along portions of both lateral ventricles (most notably the frontal horns)(for instance as seen on series 18, image 12).  Mild multifocal T2 FLAIR hyperintense signal abnormality elsewhere within the cerebral white matter, nonspecific but compatible chronic small vessel ischemic disease.  There is no acute infarct.  No chronic intracranial blood products.  No extra-axial fluid collection.  No midline shift or hydrocephalus.  Vascular: Maintained flow voids within the proximal large arterial vessels.  Skull and upper cervical spine: No focal worrisome marrow lesion. Incompletely assessed cervical spondylosis.  Sinuses/Orbits: No mass or acute finding within the imaged orbits. No significant paranasal sinus disease.  IMPRESSION: 1. Intermittently motion degraded exam. 2. 2.6 x 1.4 x 1.8 cm enhancing mass centered within the left aspect of the callosal splenium with mild associated edema and mild effacement of the left lateral ventricle. Additionally, smooth abnormal enhancement is also noted along portions of both lateral ventricles (most notably the frontal horns). Lymphoma is favored. However, alternative considerations include a primary CNS neoplasm or metastasis with intraventricular dissemination of disease. If not already performed, consider a lumbar puncture for CSF sampling. Additionally, consider a  total spine MRI (with and without contrast) for further evaluation. 3. Background parenchymal atrophy and mild cerebral white matter chronic small vessel ischemic disease.  Electronically Signed   By: Jackey Loge D.O.   On: 04/21/2023 18:13 CT HEAD WO CONTRAST ( ) CLINICAL DATA:  Initial evaluation for delirium.  EXAM: CT HEAD WITHOUT CONTRAST  TECHNIQUE: Contiguous axial images were obtained from the base of the skull through the vertex without intravenous contrast.  RADIATION DOSE REDUCTION: This exam was performed according to the departmental dose-optimization program which includes automated exposure control, adjustment of the mA and/or kV according to patient size and/or use of iterative reconstruction technique.  COMPARISON:  None Available.  FINDINGS: Brain: Cerebral volume within normal limits. No acute intracranial hemorrhage. No acute large vessel territory infarct.  Well-circumscribed ovoid hyperdense mildly expansile mass positioned at the left aspect of the splenium measures up to 2.7 cm (series 2, image 17). Mild surrounding edema without significant regional mass effect. This appears intra-axial in location.  No other mass lesion, mass effect or midline shift. No hydrocephalus or extra-axial fluid collection.  Vascular: No abnormal hyperdense vessel.  Skull: Scalp soft tissues within normal limits.  Calvarium intact.  Sinuses/Orbits: Globes orbital soft tissues within normal limits. Paranasal sinuses are clear. No mastoid effusion.  Other: None.  IMPRESSION: 1. 2.7 cm hyperdense intra-axial mass positioned at the left aspect of the splenium. Mild surrounding edema without significant regional mass effect. Finding is nonspecific, but concerning for a neoplastic process, with lymphoma a consideration given patient history as well as the periventricular location. Further evaluation with dedicated brain MRI, with and without contrast, recommended for further evaluation. 2. No other acute intracranial abnormality.  Electronically Signed   By: Rise Mu M.D.   On: 04/21/2023 00:17    I personally reviewed recent imaging.   Palliative Care  Assessment and Plan Summary of Established Goals of Care and Medical Treatment Preferences   Patient is a 78 year old female with a past medical history of permanent A-fib status post watchman implant not on anticoagulation, hypothyroidism, hyperlipidemia, anxiety, CKD, HFpEF, hypertension, and non-Hodgkin's lymphoma in remission who was admitted on 04/20/2023 for management of weakness, fatigue, and confusion.  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  Oncology consulted.  Concern for recurrence of lymphoma versus primary CNS tumor.  Cardiology also consulted in setting of A-fib with RVR.  Palliative medicine team consulted to assist with complex medical decision making.  # Complex medical decision making/goals of care  - Extensive discussion with patient, patient's husband, and 1 of patient's daughters at bedside as detailed above in HPI.  Patient underwent lumbar puncture to determine further answers about diagnosis.  Patient able to express how important her quality of life is to her.  Patient does not want her life to be filled with pain as this would be unacceptable to her.  Did spend time educating on pain management if needed.  Patient also stated that she would not want to undergo radiation as she saw all her husband went through and that would not be quality of life to her.  Encouraged further discussions with providers about therapies that would be available moving forward.  Spent time discussing what the goal of a palliative therapy would be in terms of prolonging life, not curing the cancer.  Currently continuing appropriate medical interventions while awaiting LP results.  Noted palliative  medicine team continue to follow along to engage in conversations as appropriate and able.  -  Code Status: Limited: Do not attempt resuscitation (DNR) -DNR-LIMITED -Do Not  Intubate/DNI     - Discussed CODE STATUS with patient while family at bedside.  Patient noted that she would not want to be on life support.  Explained full code versus DNR/DNI.  After discussion patient electing to change CODE STATUS to DNR/DNI.  Also provided patient's family with completed goal DNR form.  # Psycho-social/Spiritual Support:  - Support System: Husband, daughters  # Discharge Planning:  To Be Determined  Thank you for allowing the palliative care team to participate in the care Michon Thomes Cake.  Alvester Morin, DO Palliative Care Provider PMT # [phone]  If patient remains symptomatic despite maximum doses, please call PMT at [phone] between 0700 and 1900. Outside of these hours, please call attending, as PMT does not have night coverage.

## 2023-04-23 NOTE — Progress Notes (Signed)
 Rounding Note    Patient Name: [name] [name] Date of Encounter: 04/23/2023  [facility] Cardiologist: Donato Schultz, MD   Subjective   Feeling OK.  No CP or palpitations.  Inpatient Medications    Scheduled Meds:  buPROPion  150 mg Oral Daily   Chlorhexidine Gluconate Cloth  6 each Topical Q2200   clopidogrel  75 mg Oral Daily   enoxaparin (LOVENOX) injection  40 mg Subcutaneous Q24H   fosfomycin  3 g Oral Q72H   levothyroxine  75 mcg Oral QAC breakfast   metoprolol tartrate  25 mg Oral BID   nystatin   Topical TID   polyethylene glycol  17 g Oral Daily   pravastatin  40 mg Oral QHS   senna-docusate  1 tablet Oral BID   sodium chloride flush  10-40 mL Intracatheter Q12H   Continuous Infusions:  diltiazem (CARDIZEM) infusion 12.5 mg/hr (04/23/23 0657)   PRN Meds: acetaminophen, dextromethorphan-guaiFENesin   Vital Signs    Vitals:   04/23/23 0130 04/23/23 0200 04/23/23 0400 04/23/23 0720  BP:  (!) 86/58 112/65 115/77  Pulse: 94 86 95 91  Resp: (!) 24 17 19  (!) 24  Temp:      TempSrc:      SpO2: 95% 96% (!) 86% 92%  Weight:      Height:        Intake/Output Summary (Last 24 hours) at 04/23/2023 0946 Last data filed at 04/23/2023 0657 Gross per 24 hour  Intake 646.22 ml  Output 350 ml  Net 296.22 ml      04/20/2023    9:40 PM 03/09/2023    8:44 AM 02/04/2023    7:23 AM  Last 3 Weights  Weight (lbs) 219 lb 12.8 oz 235 lb 3.2 oz 233 lb  Weight (kg) 99.7 kg 106.686 kg 105.688 kg      Telemetry    Atrial fibrillation.  Rate 90-100s - Personally Reviewed  ECG    N/a - Personally Reviewed  Physical Exam   VS:  BP 115/77   Pulse 91   Temp 97.8 F (36.6 C) (Oral)   Resp (!) 24   Ht 5\' 7"  (1.702 m)   Wt 99.7 kg   SpO2 92%   BMI 34.43 kg/m  , BMI Body mass index is 34.43 kg/m. GENERAL:  Chronically ill-appearing HEENT: Pupils equal round and reactive, fundi not visualized, oral mucosa unremarkable NECK:  No jugular venous distention,  waveform within normal limits, carotid upstroke brisk and symmetric, no bruits, no thyromegaly LUNGS:  Clear to auscultation bilaterally HEART:   Irregularly irregular.  PMI not displaced or sustained,S1 and S2 within normal limits, no S3, no S4, no clicks, no rubs, no murmurs ABD:  Flat, positive bowel sounds normal in frequency in pitch, no bruits, no rebound, no guarding, no midline pulsatile mass, no hepatomegaly, no splenomegaly EXT:  2 plus pulses throughout, no edema, no cyanosis no clubbing SKIN:  No rashes no nodules NEURO:  Cranial nerves II through XII grossly intact, motor grossly intact throughout PSYCH:  Cognitively intact, oriented to person place and time   Labs    High Sensitivity Troponin:   Recent Labs  Lab 04/20/23 1422 04/20/23 1743  TROPONINIHS 14 12     Chemistry Recent Labs  Lab 04/20/23 1422 04/21/23 0248 04/22/23 0500  NA 134* 129* 135  K 4.1 3.2* 4.4  CL 97* 97* 105  CO2 24 23 24   GLUCOSE 135* 127* 112*  BUN 31* 29* 17  CREATININE 1.94* 1.56* 1.19*  CALCIUM 9.6 8.4* 8.8*  MG 2.1  --   --   PROT 6.8  --   --   ALBUMIN 3.9  --   --   AST 24  --   --   ALT 15  --   --   ALKPHOS 85  --   --   BILITOT 1.4*  --   --   GFRNONAA 26* 34* 47*  ANIONGAP 13 9 6     Lipids No results for input(s): "CHOL", "TRIG", "HDL", "LABVLDL", "LDLCALC", "CHOLHDL" in the last 168 hours.  Hematology Recent Labs  Lab 04/21/23 0248 04/22/23 0500 04/23/23 0508  WBC 3.6* 2.8* 2.9*  RBC 3.76* 3.87 3.63*  HGB 12.4 12.7 12.0  HCT 37.8 39.4 36.5  MCV 100.5* 101.8* 100.6*  MCH 33.0 32.8 33.1  MCHC 32.8 32.2 32.9  RDW 14.0 14.3 14.2  PLT 63* 58* 72*   Thyroid  Recent Labs  Lab 04/20/23 1422  TSH 0.818    BNP Recent Labs  Lab 04/20/23 1743  BNP 71.0    DDimer No results for input(s): "DDIMER" in the last 168 hours.   Radiology    MR BRAIN W WO CONTRAST Result Date: 04/21/2023 CLINICAL DATA:  Provided history: Brain/CNS neoplasm, staging. Additional  history obtained from electronic MEDICAL RECORD NUMBERHistory of Hodgkin's lymphoma. EXAM: MRI HEAD WITHOUT AND WITH CONTRAST TECHNIQUE: Multiplanar, multiecho pulse sequences of the brain and surrounding structures were obtained without and with intravenous contrast. CONTRAST:  10mL GADAVIST GADOBUTROL 1 MMOL/ML IV SOLN COMPARISON:  Head CT 04/20/2023. Brain MRI 04/27/2020. FINDINGS: Intermittently motion degraded examination. Most notably, the coronal T2 sequence is moderate-to-severely motion degraded. Within this limitation, findings are as follows. Brain: Mild generalized cerebral atrophy. 2.6 x 1.4 x 1.8 cm homogeneously enhancing mass centered within the left aspect of the callosal splenium. Corresponding diffusion weighted signal abnormality. Mild associated edema. Mild partial effacement of the posterior body and atrium of the left lateral ventricle. Smooth abnormal enhancement is also noted along portions of both lateral ventricles (most notably the frontal horns)(for instance as seen on series 18, image 12). Mild multifocal T2 FLAIR hyperintense signal abnormality elsewhere within the cerebral white matter, nonspecific but compatible chronic small vessel ischemic disease. There is no acute infarct. No chronic intracranial blood products. No extra-axial fluid collection. No midline shift or hydrocephalus. Vascular: Maintained flow voids within the proximal large arterial vessels. Skull and upper cervical spine: No focal worrisome marrow lesion. Incompletely assessed cervical spondylosis. Sinuses/Orbits: No mass or acute finding within the imaged orbits. No significant paranasal sinus disease. IMPRESSION: 1. Intermittently motion degraded exam. 2. 2.6 x 1.4 x 1.8 cm enhancing mass centered within the left aspect of the callosal splenium with mild associated edema and mild effacement of the left lateral ventricle. Additionally, smooth abnormal enhancement is also noted along portions of both lateral ventricles  (most notably the frontal horns). Lymphoma is favored. However, alternative considerations include a primary CNS neoplasm or metastasis with intraventricular dissemination of disease. If not already performed, consider a lumbar puncture for CSF sampling. Additionally, consider a total spine MRI (with and without contrast) for further evaluation. 3. Background parenchymal atrophy and mild cerebral white matter chronic small vessel ischemic disease. Electronically Signed   By: Jackey Loge D.O.   On: 04/21/2023 18:13    Cardiac Studies   TEE 01/2023: 1. Left ventricular ejection fraction, by estimation, is 55 to 60%. The  left ventricle has normal function.   2.  Right ventricular systolic function is normal. The right ventricular  size is normal. There is mildly elevated pulmonary artery systolic  pressure. The estimated right ventricular systolic pressure is 42.5 mmHg.   3. Prior to the case, patent left atrial appendage. 2D maximal diameter  22 mm. 2D maximal diamter 23 mm.      A transeptal pucture was performed.      A 27 mm Watchman FLX Pro was placed with no leak. Small mitral  shoulder noted. Average compression ~ 20%. Successful placement.      After procedure, unchanged, trivial pericardial effusion.      There is an iatrogentic ASD; though predominantly left to right  shunting, shunting is bidirectional.. Left atrial size was severely  dilated. No left atrial/left atrial appendage thrombus was detected.   4. Right atrial size was severely dilated.   5. The mitral valve is normal in structure. Mild to moderate mitral valve  regurgitation. No evidence of mitral stenosis.   6. Severe functional tricuspid regurgitation noted. No hepatic flow  versal under general anesthesia. Jet is most prominent between  anterior-posterior and anterior-septal. Tricuspid valve regurgitation is  severe.   7. The aortic valve is tricuspid. Aortic valve regurgitation is not  visualized. No aortic stenosis  is present.   8. Cannot exclude a small PFO.    Patient Profile     78 y.o. female with permanent atrial fibrillation status post Watchman device, CKD 3A, hypertension, hyperlipidemia, MDS, large B-cell lymphoma status post chemotherapy now in remission, depression admitted with atrial fibrillation with RVR in the setting of urinary tract infection:  Assessment & Plan    # Permanent atrial fibrillation:  She has chronic atrial fibrillation.  She is unaware of being in A-fib right now.  Rates are typically well-controlled on diltiazem.  Rate is now better hold after adding oral metoprolol.  Will transition her diltiazem to 300 mg oral, which is the same as her home dose and equivalent which she is on right now through the IV.  Continue metoprolol.  She has a IT trainer.  On clopidogrel x6 months post Watchman.  Thrombocytopenia is improving.   # UTI: Management per primary team.   # Mild to moderate mitral digitation: # Severe tricuspid regurgitation: Continue to monitor.  Not volume overloaded at this time.   # Hypertension: Home oral diltiazem is on hold while she is on a diltiazem infusion.  Adding metoprolol as above.    # Hyperlipidemia: Continue pravastatin.   # Brain mass: Being evaluated by oncology.  She has a history of lymphoma thought to be in remission.  Patient currently refusing treatment and reports that she is tired.  Palliative care to assess.     For questions or updates, please contact [facility] Please consult [email or web address] for contact info under        Signed, Chilton Si, MD  04/23/2023, 9:46 AM

## 2023-04-23 NOTE — Progress Notes (Signed)
     Patient Name: [name] [name]           DOB: January 21, 1945  MRN: [id number]      Admission Date: 04/20/2023  Attending Provider: Burnadette Pop, MD  Primary Diagnosis: Chronic a-fib (HCC)   Level of care: Stepdown   OVERNIGHT PROGRESS REPORT   Patient agitated, attempting to get out of bed multiple times. High fall risk.  She is unable to be redirected by nursing staff or family.  Patient only oriented to self at this time. Appears to have intermittent confusion.   Order placed for 1 mg IV Ativan.  Will start patient on trazodone for sleep cycle. Family at bedside assisting with reorientation.     Anthoney Harada, DNP, ACNPC- AG [facility]

## 2023-04-24 DIAGNOSIS — I482 Chronic atrial fibrillation, unspecified: Secondary | ICD-10-CM | POA: Diagnosis not present

## 2023-04-24 DIAGNOSIS — R4589 Other symptoms and signs involving emotional state: Secondary | ICD-10-CM

## 2023-04-24 DIAGNOSIS — Z7189 Other specified counseling: Secondary | ICD-10-CM | POA: Diagnosis not present

## 2023-04-24 DIAGNOSIS — Z66 Do not resuscitate: Secondary | ICD-10-CM | POA: Diagnosis not present

## 2023-04-24 DIAGNOSIS — Z515 Encounter for palliative care: Secondary | ICD-10-CM | POA: Diagnosis not present

## 2023-04-24 DIAGNOSIS — G9389 Other specified disorders of brain: Secondary | ICD-10-CM | POA: Diagnosis not present

## 2023-04-24 LAB — CBC
HCT: 36.7 % (ref 36.0–46.0)
Hemoglobin: 12.1 g/dL (ref 12.0–15.0)
MCH: 33.4 pg (ref 26.0–34.0)
MCHC: 33 g/dL (ref 30.0–36.0)
MCV: 101.4 fL — ABNORMAL HIGH (ref 80.0–100.0)
Platelets: 89 10*3/uL — ABNORMAL LOW (ref 150–400)
RBC: 3.62 MIL/uL — ABNORMAL LOW (ref 3.87–5.11)
RDW: 14.3 % (ref 11.5–15.5)
WBC: 6.2 10*3/uL (ref 4.0–10.5)
nRBC: 0 % (ref 0.0–0.2)

## 2023-04-24 LAB — BASIC METABOLIC PANEL WITH GFR
Anion gap: 6 (ref 5–15)
BUN: 22 mg/dL (ref 8–23)
CO2: 24 mmol/L (ref 22–32)
Calcium: 8.9 mg/dL (ref 8.9–10.3)
Chloride: 107 mmol/L (ref 98–111)
Creatinine, Ser: 1.11 mg/dL — ABNORMAL HIGH (ref 0.44–1.00)
GFR, Estimated: 51 mL/min — ABNORMAL LOW (ref >60)
Glucose, Bld: 141 mg/dL — ABNORMAL HIGH (ref 70–99)
Potassium: 4.2 mmol/L (ref 3.5–5.1)
Sodium: 137 mmol/L (ref 135–145)

## 2023-04-24 MED ORDER — CHLORHEXIDINE GLUCONATE CLOTH 2 % EX PADS: 6.0000 | MEDICATED_PAD | Freq: Every morning | CUTANEOUS | Status: DC

## 2023-04-24 MED ORDER — LORAZEPAM 2 MG/ML IJ SOLN: 2.0000 mg | Freq: Once | INTRAMUSCULAR | Status: DC

## 2023-04-24 NOTE — Progress Notes (Signed)
   Patient Name: NIHAL DOAN Date of Encounter: 04/24/2023 [facility] Cardiologist: Dorothye Gathers, MD   Interval Summary  .    Problems with confusion/sundowning last night, much better this morning.  Having breakfast with her daughter at the bedside. Ventricular rate control is adequate with heart rate in the 80s while asleep and 90-100 while awake.  Vital Signs .    Vitals:   04/23/23 1925 04/23/23 2033 04/24/23 0000 04/24/23 0800  BP: 118/67     Pulse: 64     Resp: 16     Temp:  (!) 97.3 F (36.3 C) (!) 97.5 F (36.4 C) (!) 96.4 F (35.8 C)  TempSrc:  Axillary Axillary Oral  SpO2: 91%     Weight:      Height:        Intake/Output Summary (Last 24 hours) at 04/24/2023 0810 Last data filed at 04/23/2023 1607 Gross per 24 hour  Intake 1035.05 ml  Output --  Net 1035.05 ml      04/20/2023    9:40 PM 03/09/2023    8:44 AM 02/04/2023    7:23 AM  Last 3 Weights  Weight (lbs) 219 lb 12.8 oz 235 lb 3.2 oz 233 lb  Weight (kg) 99.7 kg 106.686 kg 105.688 kg      Telemetry/ECG    Atrial fibrillation with adequate ventricular rate control- Personally Reviewed  Physical Exam .   GEN: No acute distress.   Neck: No JVD, but prominent V waves Cardiac: Irregular , no murmurs, rubs, or gallops.  Respiratory: Clear to auscultation bilaterally. GI: Soft, nontender, non-distended  MS: No edema  Assessment & Plan .     78 y.o. female with permanent atrial fibrillation status post Watchman device, CKD 3A, hypertension, severe tricuspid regurgitation, hyperlipidemia, MDS, large B-cell lymphoma status post chemotherapy now in remission, depression admitted with atrial fibrillation with RVR in the setting of urinary tract infection. Found to have a brain mass in the left aspect of the splenium, enhancing on MRI, oncology workup in progress. Will be available for questions over the weekend, but at this point there do not appear to be active cardiac issues. On clopidogrel  following watchman device implanted more than 3 months ago.  Clopidogrel can be interrupted for 5-7 days if needed for surgical procedures.  [facility] will sign off.   Medication Recommendations:   Clopidogrel 75 mg daily Diltiazem 24-hour capsule 300 mg daily Metoprolol tartrate 25 mg twice daily Pravastatin 40 mg daily Other recommendations (labs, testing, etc): Per primary team Follow up as an outpatient: Has an appointment scheduled in the atrial fibrillation clinic 05/13/2023  For questions or updates, please contact [facility] Please consult [email or web address] for contact info under        Signed, Luana Rumple, MD

## 2023-04-24 NOTE — Progress Notes (Signed)
 [name] [name]   DOB:May 22, 1945   VH#:[id number]      ASSESSMENT & PLAN:  Non-Hodgkin's lymphoma Large B cell - Previously in remission, now with suspected recurrence -Diagnosed 08/2020.  Status post treatment with R-CHOP, Udenyca in 2023. - CT head done 04/20/2023 shows 2.7 hyperdense intra axial mass left aspect of splenium.  Finding is nonspecific but concerning for neoplastic process given history of lymphoma. -MRI brain done 04/21/2023 shows 2.6 x 1.4 x 1.8 cm enhancing mass, lymphoma favored although alternative including primary CNS neoplasm or mets cannot be excluded. - Diagnostic LP for CSF 04/23/2023, lymphocytosis, elevated protein, cytology pending   Fatigue/generalized Weakness Confusion - Patient with multiple comorbidities.  Admitted 04/20/2023 with complaints of generalized weakness and fatigue.  Family states patient has been intermittently confused for several days. - Her family is at the bedside and reports she remains confused   Thrombocytopenia  Chronic A-fib -Status post recent Watchman implant 03/2023    Hypertension Hyperlipidemia -Continue to monitor blood pressure and administer antihypertensives - On statin - Monitor labs   CKD     Code Status Full  Subjective:  She reports tolerating the lumbar puncture well.  She was unable to complete MRIs despite premedication.  Her family reports she became more confused during the night.  Objective:  Vitals:   04/24/23 0800 04/24/23 1018  BP:  123/80  Pulse:  (!) 113  Resp:    Temp: (!) 96.4 F (35.8 C)   SpO2:       Intake/Output Summary (Last 24 hours) at 04/24/2023 1047 Last data filed at 04/23/2023 1607 Gross per 24 hour  Intake 770.17 ml  Output --  Net 770.17 ml     PHYSICAL EXAMINATION: ECOG PERFORMANCE STATUS: 4 - Bedbound  Vitals:   04/24/23 0800 04/24/23 1018  BP:  123/80  Pulse:  (!) 113  Resp:    Temp: (!) 96.4 F (35.8 C)   SpO2:     Filed Weights   04/20/23 2140  Weight: 219 lb  12.8 oz (99.7 kg)    OROPHARYNX: no thrush  LUNGS: clear  HEART: Irregular  NEURO: Alert, the motor exam appears intact in the upper and lower extremities, not oriented to place.  Oriented to year and month.   All questions were answered. The patient knows to call the clinic with any problems, questions or concerns.   The total time spent in the appointment was 40 minutes encounter with patient including review of chart and various tests results, discussions about plan of care and coordination of care plan  [name] Deep, MD 04/24/2023 10:47 AM    Labs Reviewed:  Lab Results  Component Value Date   WBC 6.2 04/24/2023   HGB 12.1 04/24/2023   HCT 36.7 04/24/2023   MCV 101.4 (H) 04/24/2023   PLT 89 (L) 04/24/2023   Recent Labs    08/11/22 0848 01/11/23 0937 03/09/23 0909 04/20/23 1422 04/21/23 0248 04/22/23 0500 04/24/23 0654  NA 141 138   < > 134* 129* 135 137  K 4.1 4.3   < > 4.1 3.2* 4.4 4.2  CL 108 104   < > 97* 97* 105 107  CO2 28 29   < > 24 23 24 24   GLUCOSE 117* 123*   < > 135* 127* 112* 141*  BUN 15 23   < > 31* 29* 17 22  CREATININE 1.16* 1.25*   < > 1.94* 1.56* 1.19* 1.11*  CALCIUM 9.0 9.1   < > 9.6 8.4*  8.8* 8.9  GFRNONAA 49* 45*   < > 26* 34* 47* 51*  PROT 6.2* 6.2*  --  6.8  --   --   --   ALBUMIN 4.0 3.9  --  3.9  --   --   --   AST 16 13*  --  24  --   --   --   ALT 11 13  --  15  --   --   --   ALKPHOS 73 72  --  85  --   --   --   BILITOT 0.4 0.5  --  1.4*  --   --   --    < > = values in this interval not displayed.    Studies Reviewed:  DG FL GUIDED LUMBAR PUNCTURE Result Date: 04/23/2023 CLINICAL DATA:  Brain mass. EXAM: DIAGNOSTIC LUMBAR PUNCTURE UNDER FLUOROSCOPIC GUIDANCE COMPARISON:  None Available. FLUOROSCOPY: Radiation Exposure Index (as provided by the fluoroscopic device): 7.3 mGy Kerma PROCEDURE: Informed consent was obtained from the patient prior to the procedure, including potential complications of headache, allergy, and pain.  With the patient prone, the lower back was prepped with Betadine. 1% Lidocaine was used for local anesthesia. Lumbar puncture was performed at the L3-4 level using a 20 gauge needle with return of clear CSF. 12 ml of CSF were obtained for laboratory studies. The patient tolerated the procedure well and there were no apparent complications. IMPRESSION: Successful lumbar puncture under fluoroscopy. Electronically Signed   By: [name] [name] M.D.   On: 04/23/2023 11:56   MR BRAIN W WO CONTRAST Result Date: 04/21/2023 CLINICAL DATA:  Provided history: Brain/CNS neoplasm, staging. Additional history obtained from electronic MEDICAL RECORD NUMBERHistory of Hodgkin's lymphoma. EXAM: MRI HEAD WITHOUT AND WITH CONTRAST TECHNIQUE: Multiplanar, multiecho pulse sequences of the brain and surrounding structures were obtained without and with intravenous contrast. CONTRAST:  10mL GADAVIST GADOBUTROL 1 MMOL/ML IV SOLN COMPARISON:  Head CT 04/20/2023. Brain MRI 04/27/2020. FINDINGS: Intermittently motion degraded examination. Most notably, the coronal T2 sequence is moderate-to-severely motion degraded. Within this limitation, findings are as follows. Brain: Mild generalized cerebral atrophy. 2.6 x 1.4 x 1.8 cm homogeneously enhancing mass centered within the left aspect of the callosal splenium. Corresponding diffusion weighted signal abnormality. Mild associated edema. Mild partial effacement of the posterior body and atrium of the left lateral ventricle. Smooth abnormal enhancement is also noted along portions of both lateral ventricles (most notably the frontal horns)(for instance as seen on series 18, image 12). Mild multifocal T2 FLAIR hyperintense signal abnormality elsewhere within the cerebral white matter, nonspecific but compatible chronic small vessel ischemic disease. There is no acute infarct. No chronic intracranial blood products. No extra-axial fluid collection. No midline shift or hydrocephalus. Vascular: Maintained  flow voids within the proximal large arterial vessels. Skull and upper cervical spine: No focal worrisome marrow lesion. Incompletely assessed cervical spondylosis. Sinuses/Orbits: No mass or acute finding within the imaged orbits. No significant paranasal sinus disease. IMPRESSION: 1. Intermittently motion degraded exam. 2. 2.6 x 1.4 x 1.8 cm enhancing mass centered within the left aspect of the callosal splenium with mild associated edema and mild effacement of the left lateral ventricle. Additionally, smooth abnormal enhancement is also noted along portions of both lateral ventricles (most notably the frontal horns). Lymphoma is favored. However, alternative considerations include a primary CNS neoplasm or metastasis with intraventricular dissemination of disease. If not already performed, consider a lumbar puncture for CSF sampling. Additionally, consider a total spine MRI (  with and without contrast) for further evaluation. 3. Background parenchymal atrophy and mild cerebral white matter chronic small vessel ischemic disease. Electronically Signed   By: Bascom Lily D.O.   On: 04/21/2023 18:13   CT HEAD WO CONTRAST ( ) Result Date: 04/21/2023 CLINICAL DATA:  Initial evaluation for delirium. EXAM: CT HEAD WITHOUT CONTRAST TECHNIQUE: Contiguous axial images were obtained from the base of the skull through the vertex without intravenous contrast. RADIATION DOSE REDUCTION: This exam was performed according to the departmental dose-optimization program which includes automated exposure control, adjustment of the mA and/or kV according to patient size and/or use of iterative reconstruction technique. COMPARISON:  None Available. FINDINGS: Brain: Cerebral volume within normal limits. No acute intracranial hemorrhage. No acute large vessel territory infarct. Well-circumscribed ovoid hyperdense mildly expansile mass positioned at the left aspect of the splenium measures up to 2.7 cm (series 2, image 17). Mild  surrounding edema without significant regional mass effect. This appears intra-axial in location. No other mass lesion, mass effect or midline shift. No hydrocephalus or extra-axial fluid collection. Vascular: No abnormal hyperdense vessel. Skull: Scalp soft tissues within normal limits.  Calvarium intact. Sinuses/Orbits: Globes orbital soft tissues within normal limits. Paranasal sinuses are clear. No mastoid effusion. Other: None. IMPRESSION: 1. 2.7 cm hyperdense intra-axial mass positioned at the left aspect of the splenium. Mild surrounding edema without significant regional mass effect. Finding is nonspecific, but concerning for a neoplastic process, with lymphoma a consideration given patient history as well as the periventricular location. Further evaluation with dedicated brain MRI, with and without contrast, recommended for further evaluation. 2. No other acute intracranial abnormality. Electronically Signed   By: Virgia Griffins M.D.   On: 04/21/2023 00:17   DG Chest Portable 1 View Result Date: 04/20/2023 CLINICAL DATA:  Shortness of breath.  Fatigue. EXAM: PORTABLE CHEST 1 VIEW COMPARISON:  Radiograph 02/04/2023 FINDINGS: Right chest port in place. Mild cardiomegaly, stable. Mediastinal contours are unchanged. Subsegmental atelectasis or scarring at the left lung base. No acute airspace disease, pleural effusion, pulmonary edema or pneumothorax. IMPRESSION: Mild chronic cardiomegaly. Subsegmental atelectasis or scarring at the left lung base. Electronically Signed   By: Chadwick Colonel M.D.   On: 04/20/2023 16:14   CT CARDIAC MORPH/PULM VEIN W/CM&W/O CA SCORE Addendum Date: 04/17/2023 ADDENDUM REPORT: 04/17/2023 02:57 EXAM: OVER-READ INTERPRETATION  CT CHEST The following report is an over-read performed by radiologist Dr. Violeta Grey of [facility] on 04/17/2023. This over-read does not include interpretation of cardiac or coronary anatomy or pathology. The coronary calcium  score/coronary CTA interpretation by the cardiologist is attached. COMPARISON:  None. FINDINGS: Cardiovascular: No significant atherosclerotic calcifications are noted. Left atrial appendage occlusion device is seen. Central catheter is noted as well. Mediastinum/Nodes: There are no enlarged lymph nodes within the visualized mediastinum. Lungs/Pleura: There is no pleural effusion. The visualized lungs appear clear. Upper abdomen: No significant findings in the visualized upper abdomen. Musculoskeletal/Chest wall: No chest wall mass or suspicious osseous findings within the visualized chest. IMPRESSION: No significant extracardiac findings within the visualized chest. Electronically Signed   By: Violeta Grey M.D.   On: 04/17/2023 02:57   Result Date: 04/17/2023 CLINICAL DATA:  Follow up for a Watchman Flex Device EXAM: Cardiac CT/CTA TECHNIQUE: The patient was scanned on a Siemens Somatom scanner. FINDINGS: A 120 kV prospective scan was triggered in the descending thoracic aorta at 111 HU's. Gantry rotation speed was 280 msecs and collimation was .9 mm. No beta blockade and no NTG was given.  The 3D data set was reconstructed in 5% intervals of the 60-80 % of the R-R cycle. Systolic phases were analyzed on a dedicated work station using MPR, MIP and VRT modes. The patient received 95 cc of contrast. Device Position and stability Size: 27 mm Position: No evidence of device migration or embolization Average compression: 15 % Mitral shoulder: Present, less then 1/2 shoulder Occlusion Assessment Complete occlusion of the device Presence of contrast in the distal appendage to the device suggesting incomplete endothelialization Peridevice leak: Absent There is no evidence of device related thrombus. Interatrial septum: No communications. Non-appendage cardiac findings: Aorta:  Normal caliber.  No dissection.  No calcification. Main pulmonary artery: Mild dilation 30 mm Aortic Valve:  Tri-leaflet. No calcification. Chamber  dimensions: Severe right atrial dilation. Venous drainage: Normal pulmonary vein anatomy. Valve assessment: Mitral annular calcification noted. There is a catheter that terminates in the right atrium. Coronary Arteries: Normal coronary origin. The study was performed without use of NTG and insufficient for plaque evaluation. Coronary Calcium Score: 414; increased from 2023. Extra-cardiac findings: See attached radiology report for non-cardiac structures. IMPRESSION: 1. Successful left atrial appendage occlusion with complete seal. 2. Absence of peridevice leak. 3. Absence of device-related thrombus. 4. Stable device position without evidence of migration or embolization. Gloriann Larger, MD Electronically Signed: By: Gloriann Larger M.D. On: 04/07/2023 15:20   Ms. Haslip was interviewed and examined.  Multiple family members were at the bedside when I saw her at approximately 830 this morning.  We discussed results from the lumbar puncture.  The CSF protein is elevated and there is an elevated lymphocyte count.  The CSF cytology is pending.  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  We discussed treatment options for CNS lymphoma including palliative radiation and systemic chemotherapy.  We also discussed comfort care.  She was unable to complete staging spine MRIs despite Ativan premedication.  We decided to cancel the staging MRIs.  We discussed disposition given the persistent confusion.  She will be a candidate for hospice care if the CSF cytology confirms lymphoma and she decides against treatment.    Recommendations: Follow-up CSF cytology Management of atrial fibrillation per the medical service and cardiology Continue discussions with Ms. Olson and her family regarding goals of care and disposition plans

## 2023-04-24 NOTE — Progress Notes (Signed)
 Progress Note   Patient: [name] [name] JXB:[id number] DOB: Dec 10, 1945 DOA: 04/20/2023     4 DOS: the patient was seen and examined on 04/24/2023   Brief hospital course: Patient is a 78 y.o. female with medical history significant of permanent A-fib status post Watchman implant currently not on anticoagulation, status post cardioversion in the past, non-Hodgkin's lymphoma, hypothyroidism, hyperlipidemia, anxiety, CKD stage IIIa, HFpEF, hypertension who presented from home with complaint of weakness, fatigue, confusion. On presentation, she was in rapid A-fib with heart rate in the range of 130s.  Cardiology consulted, started on Cardizem drip.  CT scan of the brain showed 2.7 cm hyperdense intra-axial mass with mild surrounding edema concerning for metastatic disease.  MRI of the brain with and without contrast showed 2.6 x 1.4 x 1.8 cm enhancing mass centered within the left aspect of the callosal splenium with mild associated edema and mild effacement of the left lateral ventricle.  Cardiology was consulted, and the patient's atrial fibrillation appears to be under fair control. No further cardiology issues per Dr. Alvis Ba 04/24/2023. His assistance is greatly appreciated. Recommendation is for continuation of clopidrogel following placement of watchman device implanted more than 3 months ago. Continue: Diltiazem CD 300 mg daily Metoprolol tartrate 25 mg bid Pravastatin 40 mg daily She will follow up with cardiology as outpatient at previously scheduled appointment in AF clinic on 05/13/2023.  Oncology and Pallliative care were also consulted.   On 04/24/2023 Palliative care visited the patient and has documented her wish for DNR/DNI status, although ultimately plans for ongoing therapy or not are dependent upon the results of the CSF cytology. There is concern for recurrence of lymphoma vs primary CNS tumor. Patient is denying pain.   The patient was also seen by Dr. Scherrie Curt, her oncologist. He is  awaiting results of CNS cytology as well.   Ms Salts has been quite confused at night for the last few days.   Assessment and Plan:  Principal Problem:   Chronic a-fib (HCC) Active Problems:   Generalized anxiety disorder   Hyperlipidemia   Essential hypertension   (HFpEF) heart failure with preserved ejection fraction (HCC)   Hypothyroidism   Chronic kidney disease, stage 3 unspecified (HCC)   Atrial fibrillation with RVR (HCC)   AKI (acute kidney injury) (HCC)   Brain mass     A-fib with RVR: History of chronic A-fib.  CHADS2VASC score of 6. Status post Watchman implant about 3 months ago.  Status post several cardioversions, the last one on 05/15/2021.  Follows with cardiology. Started on Cardizem drip.  Cardiology  consulted.  Currently not on anticoagulation.  Takes oral Cardizem, Plavix at home.  Heart rate better today, Cardizem drip has been transitioned to Cardizem CD. No further cardiac issues per cardiology.   Possible brain mets or primary CNS malignancy/confusion /history of non-Hodgkin's lymphoma: Follows with Dr. Sharalyn Dasen.  Status posttreatment with chemotherapy and Rituxan.  Has chronic thrombocytopenia. CT scan of the brain showed 2.7 cm hyperdense intra-axial mass with mild surrounding edema concerning for metastatic disease.  MRI showed  2.6 x 1.4 x 1.8 cm enhancing mass centered within the left aspect of the callosal splenium with mild associated edema and mild effacement of the left lateral ventricle.  Most likely lymphoma, primary CNS manage not ruled out. plan for lumbar puncture for CSF studies and MRI of the cervical/thoracic/lumbar spine. She has leukopenia, thrombocytopenia, likely associated  with lymphoma. Awaiting results of CNS cytology for planning of further treatment/palliative care.  Delirium: The patient has been getting very confused overnight in the past few days. Delirium precautions in place.   AKI in CKD stage IIIa: Baseline creatinine around  1.5.  Her last creatinine was 1.1.  Presented with creatinine in the range of 1.9.  Likely associated with with poor oral intake.  Improved with IV fluid.   History of OSA: Currently not on CPAP   History of anxiety/depression: On Xanax, Wellbutrin   Chronic HFpEF: Last echo showed EF of 60 to 65%.  Currently might be dehydrated. Started gentle IV fluid,now stopped. takes Lasix 20 mg as needed at home   Hypothyroidism: Continue Synthyroid   History of hyperlipidemia: On pravastatin at home   Confusion/UTI: New problem as per the family.   Likely due to metastatic lymphoma or primary CNS malignancy.urine culture showed 20,000 colonies of E. coli.  Already given 1 dose  of fosfomycin.  Currently alert and oriented   Goals of care: Elderly patient with multiple comorbidities, including CNS neoplasm, metastatic Hodgkin's lymphoma.  Palliative care also  consulted for goals of care   Generalized weakness: Will consult PT/OT when appropriate   I have seen and examined this patient myself. I have spent 34 minutes in her evaluation and care.  DVT prophylaxis:enoxaparin (LOVENOX) injection 40 mg Start: 04/21/23 2200       Code Status: Full Code   Family Communication: Daughter and husband at bedside on 4/11   Patient status: Inpatient   Patient is from : Home   Anticipated discharge to: Home   Estimated DC date: 2 to 3 days, awaiting full workup     Consultants: Cardiology, oncology   Procedures: None   Antimicrobials:       Subjective: The patient is resting comfortably. She is pleasantly confused.  Physical Exam: Vitals:   04/24/23 0800 04/24/23 1018 04/24/23 1200 04/24/23 1237  BP: 123/80 123/80 119/62 107/63  Pulse:  (!) 113 75 83  Resp: (!) 24  (!) 26 14  Temp: (!) 96.4 F (35.8 C)  (!) 97.1 F (36.2 C) (!) 94.5 F (34.7 C)  TempSrc: Oral  Oral Axillary  SpO2:   96% 98%  Weight:      Height:       Exam:  Constitutional:  The patient is awake, alert, and  oriented x 3. No acute distress. Respiratory:  No increased work of breathing. No wheezes, rales, or rhonchi No tactile fremitus Cardiovascular:  Regular rate and rhythm No murmurs, ectopy, or gallups. No lateral PMI. No thrills. Abdomen:  Abdomen is soft, non-tender, non-distended No hernias, masses, or organomegaly Normoactive bowel sounds.  Musculoskeletal:  No cyanosis, clubbing, or edema Skin:  No rashes, lesions, ulcers palpation of skin: no induration or nodules Neurologic:  CN 2-12 intact Sensation all 4 extremities intact Psychiatric:  Mental status Mood, affect appropriate Orientation to person, place, time  judgment and insight appear intact  Data Reviewed:  CBC BMP Pathology  Family Communication: Noen available  Disposition: Status is: Inpatient Remains inpatient appropriate because: Need for ongoing monitoring and treatment pending results of cytology  Planned Discharge Destination:  TBD    Time spent: 32 minutes  Author: Iktan Aikman, DO 04/24/2023 4:49 PM  For on call review [email or web address].

## 2023-04-24 NOTE — Progress Notes (Signed)
 Patient arrived via W/C from ICU. Care assumed. Ambulated with assistance to bathroom, then to bed. No complaints of pain, but stated she felt very tired. Family at bedside.

## 2023-04-24 NOTE — Plan of Care (Signed)

## 2023-04-24 NOTE — Progress Notes (Signed)
 Daily Progress Note   Patient Name: [name] [name]       Date: 04/24/2023 DOB: 07/19/1945  Age: 78 y.o. MRN#: [id number] Attending Physician: Fran Lowes, DO Primary Care Physician: Laurann Montana, MD Admit Date: 04/20/2023 Length of Stay: 4 days  Reason for Consultation/Follow-up: Establishing goals of care  Subjective:   CC: Patient denies any symptoms of concern at this time.  Following up regarding complex medical decision making.  Subjective:  Reviewed EMR prior to presenting to bedside.  BMP for today noted BUN 22 and creatinine 1.11.  Reviewed oncologist note from today.  CSF protein is elevated and there is elevated lymphocyte count though the cytology is still pending.  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  Discussed possibilities of palliative radiation and systemic chemotherapy.  Patient was unable to complete MRIs for staging despite Ativan; so these were canceled.  Oncologist did discuss with family that she would be candidate for hospice if this CSF cytology confirms lymphoma and she decides against cancer directed therapies. Discussed care with bedside RN for updates.  Presented to bedside to see patient.  Patient's daughter present at bedside.  Again introduced myself as a member of the palliative medicine team though patient and daughter remembered speaking with me yesterday.  Plan is for patient to transfer to floor room.  Still awaiting CSF cytology.  Inquired about symptom burden at this time and patient denies any symptoms of concern such as pain.  Spent time offering emotional support via active listening.  Patient tearful during visit.  Noted palliative medicine team will continue to follow patient's medical journey.  Objective:   Vital Signs:  BP 123/80   Pulse (!) 113   Temp (!) 96.4 F (35.8 C) (Oral) Comment: RN Notified  Resp 16   Ht 5\' 7"  (1.702 m)   Wt 99.7 kg   SpO2 91%   BMI 34.43 kg/m   Physical  Exam: General: NAD, awake, alert, chronically ill-appearing Cardiovascular: Tachycardia noted Respiratory: no increased work of breathing noted, not in respiratory distress Neuro:  awake, interactive, following commands appropriately   Imaging:  I personally reviewed recent imaging.   Assessment & Plan:   Assessment: Patient is a 78 year old female with a past medical history of permanent A-fib status post watchman implant not on anticoagulation, hypothyroidism, hyperlipidemia, anxiety, CKD, HFpEF, hypertension, and non-Hodgkin's lymphoma in remission who was admitted on 04/20/2023 for management of weakness, fatigue, and confusion.  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  Oncology consulted.  Concern for recurrence of lymphoma versus primary CNS tumor.  Cardiology also consulted in setting of A-fib with RVR.  Palliative medicine team consulted to assist with complex medical decision making.   Recommendations/Plan: # Complex medical decision making/goals of care:  - Had extensive discussion with patient while family present on 04/23/2023.  At that time patient stated she does not want her life to be filled with pain as this would be unacceptable to her.  Did spend time educating on pain management if needed.  Patient also stated that she would not want to undergo radiation as she saw all her husband went through and that would not be quality of life to her.  Oncology discussing possible palliative radiation and/or systemic chemotherapy with patient.  Already reviewed the goal of a palliative therapy would be in terms of prolonging life, not  curing the cancer.  Oncology did also inform patient of appropriateness of hospice should she come back with having CNS lymphoma and does not want cancer directed therapies.  Awaiting CSF cytology at this time.  Noted palliative  medicine team continue to follow along to engage in conversations as appropriate and able.                -  Code Status: Limited: Do not attempt resuscitation (DNR) -DNR-LIMITED -Do Not Intubate/DNI   - Support System: Husband, daughters   # Discharge Planning:  To Be Determined  Discussed with: Patient, patient's daughter at bedside, RN  Thank you for allowing the palliative care team to participate in the care Adonica W Takaki.  Barnett Libel, DO Palliative Care Provider PMT # [phone]  If patient remains symptomatic despite maximum doses, please call PMT at [phone] between 0700 and 1900. Outside of these hours, please call attending, as PMT does not have night coverage.  Personally spent 35 minutes in patient care including extensive chart review (labs, imaging, progress/consult notes, vital signs), medically appropraite exam, discussed with treatment team, education to patient, family, and staff, documenting clinical information, medication review and management, coordination of care, and available advanced directive documents.

## 2023-04-25 ENCOUNTER — Encounter: Payer: Self-pay | Admitting: Oncology

## 2023-04-25 DIAGNOSIS — I482 Chronic atrial fibrillation, unspecified: Secondary | ICD-10-CM | POA: Diagnosis not present

## 2023-04-25 DIAGNOSIS — Z515 Encounter for palliative care: Secondary | ICD-10-CM | POA: Diagnosis not present

## 2023-04-25 DIAGNOSIS — G9389 Other specified disorders of brain: Secondary | ICD-10-CM | POA: Diagnosis not present

## 2023-04-25 DIAGNOSIS — C8330 Diffuse large B-cell lymphoma, unspecified site: Secondary | ICD-10-CM

## 2023-04-25 DIAGNOSIS — Z7189 Other specified counseling: Secondary | ICD-10-CM | POA: Diagnosis not present

## 2023-04-25 DIAGNOSIS — Z66 Do not resuscitate: Secondary | ICD-10-CM | POA: Diagnosis not present

## 2023-04-25 LAB — CBC WITH DIFFERENTIAL/PLATELET
Abs Immature Granulocytes: 0 10*3/uL (ref 0.00–0.07)
Basophils Absolute: 0 10*3/uL (ref 0.0–0.1)
Basophils Relative: 0 %
Eosinophils Absolute: 0.2 10*3/uL (ref 0.0–0.5)
Eosinophils Relative: 6 %
HCT: 36.2 % (ref 36.0–46.0)
Hemoglobin: 11.5 g/dL — ABNORMAL LOW (ref 12.0–15.0)
Lymphocytes Relative: 28 %
Lymphs Abs: 1 10*3/uL (ref 0.7–4.0)
MCH: 32.9 pg (ref 26.0–34.0)
MCHC: 31.8 g/dL (ref 30.0–36.0)
MCV: 103.4 fL — ABNORMAL HIGH (ref 80.0–100.0)
Monocytes Absolute: 0.3 10*3/uL (ref 0.1–1.0)
Monocytes Relative: 7 %
Neutro Abs: 2.2 10*3/uL (ref 1.7–7.7)
Neutrophils Relative %: 59 %
Platelets: 72 10*3/uL — ABNORMAL LOW (ref 150–400)
RBC: 3.5 MIL/uL — ABNORMAL LOW (ref 3.87–5.11)
RDW: 14.4 % (ref 11.5–15.5)
WBC: 3.7 10*3/uL — ABNORMAL LOW (ref 4.0–10.5)
nRBC: 0 % (ref 0.0–0.2)

## 2023-04-25 LAB — BASIC METABOLIC PANEL WITH GFR
Anion gap: 5 (ref 5–15)
BUN: 28 mg/dL — ABNORMAL HIGH (ref 8–23)
CO2: 25 mmol/L (ref 22–32)
Calcium: 8.4 mg/dL — ABNORMAL LOW (ref 8.9–10.3)
Chloride: 108 mmol/L (ref 98–111)
Creatinine, Ser: 1.3 mg/dL — ABNORMAL HIGH (ref 0.44–1.00)
GFR, Estimated: 42 mL/min — ABNORMAL LOW (ref >60)
Glucose, Bld: 135 mg/dL — ABNORMAL HIGH (ref 70–99)
Potassium: 3.8 mmol/L (ref 3.5–5.1)
Sodium: 138 mmol/L (ref 135–145)

## 2023-04-25 LAB — GLUCOSE, CAPILLARY
Glucose-Capillary: 136 mg/dL — ABNORMAL HIGH (ref 70–99)
Glucose-Capillary: 151 mg/dL — ABNORMAL HIGH (ref 70–99)

## 2023-04-25 MED ORDER — TRAZODONE HCL 50 MG PO TABS: 50.0000 mg | ORAL_TABLET | Freq: Every evening | ORAL | 0 refills | Status: DC | PRN

## 2023-04-25 MED ORDER — SENNOSIDES-DOCUSATE SODIUM 8.6-50 MG PO TABS: 1.0000 | ORAL_TABLET | Freq: Two times a day (BID) | ORAL | 0 refills | Status: DC

## 2023-04-25 MED ORDER — HEPARIN SOD (PORK) LOCK FLUSH 100 UNIT/ML IV SOLN
500.0000 [IU] | Freq: Once | INTRAVENOUS | Status: AC
  Administered 2023-04-25: 500 [IU] via INTRAVENOUS
  Filled 2023-04-25: qty 5

## 2023-04-25 MED ORDER — METOPROLOL TARTRATE 25 MG PO TABS: 25.0000 mg | ORAL_TABLET | Freq: Two times a day (BID) | ORAL | 0 refills | Status: DC

## 2023-04-25 MED ORDER — LORAZEPAM 2 MG/ML IJ SOLN: 1.0000 mg | INTRAMUSCULAR | Status: DC | PRN

## 2023-04-25 MED ORDER — OLANZAPINE 5 MG PO TABS: 5.0000 mg | ORAL_TABLET | Freq: Once | ORAL | Status: DC

## 2023-04-25 MED ORDER — NYSTATIN 100000 UNIT/GM EX POWD: Freq: Three times a day (TID) | CUTANEOUS | 0 refills | Status: DC

## 2023-04-25 NOTE — Discharge Summary (Signed)
 Physician Discharge Summary   Patient: [name] [name] MRN: [id number] DOB: 14-Dec-1945  Admit date:     04/20/2023  Discharge date: 04/25/23  Discharge Physician: [name] [name]   PCP: [name] Grave, MD   Recommendations at discharge:    Discharge to home with hospice.  Discharge Diagnoses: Principal Problem:   Chronic a-fib (HCC) Active Problems:   Generalized anxiety disorder   Hyperlipidemia   Essential hypertension   (HFpEF) heart failure with preserved ejection fraction (HCC)   Hypothyroidism   Chronic kidney disease, stage 3 unspecified (HCC)   Atrial fibrillation with RVR (HCC)   AKI (acute kidney injury) (HCC)   Brain mass   Palliative care encounter   Goals of care, counseling/discussion   DNR (do not resuscitate)   Need for emotional support   Counseling and coordination of care  Resolved Problems:   * No resolved hospital problems. *  Hospital Course: Patient is a 78 y.o. female with medical history significant of permanent A-fib status post Watchman implant currently not on anticoagulation, status post cardioversion in the past, non-Hodgkin's lymphoma, hypothyroidism, hyperlipidemia, anxiety, CKD stage IIIa, HFpEF, hypertension who presented from home with complaint of weakness, fatigue, confusion. On presentation, she was in rapid A-fib with heart rate in the range of 130s.  Cardiology consulted, started on Cardizem drip.  CT scan of the brain showed 2.7 cm hyperdense intra-axial mass with mild surrounding edema concerning for metastatic disease.  MRI of the brain with and without contrast showed 2.6 x 1.4 x 1.8 cm enhancing mass centered within the left aspect of the callosal splenium with mild associated edema and mild effacement of the left lateral ventricle.   Cardiology was consulted, and the patient's atrial fibrillation appears to be under fair control. No further cardiology issues per Dr. Alvis [name] 04/24/2023. His assistance is greatly appreciated. Recommendation is  for continuation of clopidrogel following placement of watchman device implanted more than 3 months ago. Continue: Diltiazem CD 300 mg daily Metoprolol tartrate 25 mg bid Pravastatin 40 mg daily She will follow up with cardiology as outpatient at previously scheduled appointment in AF clinic on 05/13/2023.   Oncology and Pallliative care were also consulted.    On 04/24/2023 Palliative care visited the patient and has documented her wish for DNR/DNI status, although ultimately plans for ongoing therapy or not are dependent upon the results of the CSF cytology. There is concern for recurrence of lymphoma vs primary CNS tumor. Patient is denying pain.    The patient was also seen by Dr. Scherrie [name], her oncologist. He is awaiting results of CNS cytology as well.    [name] [name] has been quite confused at night for the last few days.   On 04/25/2023 the patient and her family decided to not wait for cytology and to discharge to home on hospice the same day. She did want a hospital bed, but stated that she could go home without it as it could not be arranged for today.  The patient discharged to home with [facility] hospice on 04/24/2023.  Assessment and Plan: A-fib with RVR: History of chronic A-fib.  CHADS2VASC score of 6. Status post Watchman implant about 3 months ago.  Status post several cardioversions, the last one on 05/15/2021.  Follows with cardiology. Started on Cardizem drip.  Cardiology  consulted.  Currently not on anticoagulation.  Takes oral Cardizem, Plavix at home.  Heart rate better today, Cardizem drip has been transitioned to Cardizem CD. No further cardiac issues per cardiology.   Possible  brain mets or primary CNS malignancy/confusion /history of non-Hodgkin's lymphoma: Follows with [name] [name].  Status posttreatment with chemotherapy and Rituxan.  Has chronic thrombocytopenia. CT scan of the brain showed 2.7 cm hyperdense intra-axial mass with mild surrounding edema concerning for  metastatic disease.  MRI showed  2.6 x 1.4 x 1.8 cm enhancing mass centered within the left aspect of the callosal splenium with mild associated edema and mild effacement of the left lateral ventricle.  Most likely lymphoma, primary CNS manage not ruled out. plan for lumbar puncture for CSF studies and MRI of the cervical/thoracic/lumbar spine. She has leukopenia, thrombocytopenia, likely associated  with lymphoma. Awaiting results of CNS cytology for planning of further treatment/palliative care.    Delirium: The patient has been getting very confused overnight in the past few days. Delirium precautions in place.   AKI in CKD stage IIIa: Baseline creatinine around 1.5.  Her last creatinine was 1.1.  Presented with creatinine in the range of 1.9.  Likely associated with with poor oral intake.  Improved with IV fluid.   History of OSA: Currently not on CPAP   History of anxiety/depression: On Xanax, Wellbutrin   Chronic HFpEF: Last echo showed EF of 60 to 65%.  Currently might be dehydrated. Started gentle IV fluid,now stopped. takes Lasix 20 mg as needed at home   Hypothyroidism: Continue Synthyroid   History of hyperlipidemia: On pravastatin at home   Confusion/UTI: New problem as per the family.   Likely due to metastatic lymphoma or primary CNS malignancy.urine culture showed 20,000 colonies of E. coli.  Already given 1 dose  of fosfomycin.  Currently alert and oriented   Goals of care: Elderly patient with multiple comorbidities, including CNS neoplasm, metastatic Hodgkin's lymphoma.  Palliative care also  consulted for goals of care   Generalized weakness: Will consult PT/OT when appropriate   I have seen and examined this patient myself. I have spent 34 minutes in her evaluation and care.   DVT prophylaxis:enoxaparin (LOVENOX) injection 40 mg Start: 04/21/23 2200       Code Status: Full Code   Family Communication: Daughter and husband at bedside on 4/11   Patient status:  Inpatient   Patient is from : Home   Anticipated discharge to: Home   Estimated DC date: 2 to 3 days, awaiting full workup     Consultants: Cardiology, oncology, pallliative care   Procedures: Lumbar puncture.   Antimicrobials:  Anti-infectives (From admission, onward)    Start     Dose/Rate Route Frequency Ordered Stop   04/23/23 1045  fosfomycin (MONUROL) packet 3 g  Status:  Discontinued        3 g Oral  Once 04/23/23 1040 04/23/23 1041   04/21/23 0830  cefTRIAXone (ROCEPHIN) 1 g in sodium chloride 0.9 % 100 mL IVPB  Status:  Discontinued        1 g 200 mL/hr over 30 Minutes Intravenous Every 24 hours 04/21/23 0730 04/21/23 0743   04/21/23 0830  fosfomycin (MONUROL) packet 3 g  Status:  Discontinued        3 g Oral every 72 hours 04/21/23 0742 04/23/23 1040       Disposition:  Home with hospice Diet recommendation:  Discharge Diet Orders (From admission, onward)     Start     Ordered   04/25/23 0000  Diet - low sodium heart healthy        04/25/23 1222           Cardiac  diet DISCHARGE MEDICATION: Allergies as of 04/25/2023       Reactions   Omnicef [cefdinir] Swelling, Other (See Comments)   Tongue swelling   Rituxan [rituximab] Shortness Of Breath, Other (See Comments)   Patient given Pepcid 20mg  IV with fluids and reported feeling better. Treatment was re-started and patient was able to tolerate the rest of the treatment with no issues.    Duloxetine Other (See Comments)   "Felt funny- thinking was messed up"   Escitalopram Other (See Comments)   Makes her feel funny and sleepy   Mirtazapine Other (See Comments)   Weight gain in high doses    Penicillins Swelling, Other (See Comments)   Tongue became swollen   Peroxide [hydrogen Peroxide] Other (See Comments)   Redness.    Simvastatin Other (See Comments)   Muscle pain   Zoloft [sertraline Hcl] Other (See Comments)   Makes her "feel funny"   Bacitracin Rash   Bacitracin-polymyxin B Rash    Neosporin [neomycin-bacitracin Zn-polymyx] Itching, Rash, Other (See Comments)   Blisters, too        Medication List     STOP taking these medications    ALPRAZolam 0.5 MG tablet Commonly known as: XANAX   diltiazem 30 MG tablet Commonly known as: CARDIZEM   furosemide 20 MG tablet Commonly known as: LASIX   potassium chloride SA 20 MEQ tablet Commonly known as: KLOR-CON M       TAKE these medications    acetaminophen 500 MG tablet Commonly known as: TYLENOL Take 1,000 mg by mouth every 6 (six) hours as needed for moderate pain.   AeroChamber MV inhaler Use as instructed   albuterol 108 (90 Base) MCG/ACT inhaler Commonly known as: Ventolin HFA Inhale 1-2 puffs into the lungs every 4 (four) hours as needed for wheezing or shortness of breath.   benzonatate 200 MG capsule Commonly known as: TESSALON Take 1 capsule by mouth three times daily as needed for cough What changed: See the new instructions.   buPROPion 150 MG 24 hr tablet Commonly known as: WELLBUTRIN XL Take 150 mg by mouth daily.   Calcium 250 MG Caps Take 250 mg by mouth daily.   clopidogrel 75 MG tablet Commonly known as: PLAVIX Take 1 tablet (75 mg total) by mouth daily.   dextromethorphan-guaiFENesin 30-600 MG 12hr tablet Commonly known as: MUCINEX DM Take 1 tablet by mouth 2 (two) times daily as needed for cough.   diltiazem 300 MG 24 hr capsule Commonly known as: CARDIZEM CD Take 1 capsule (300 mg total) by mouth daily. What changed: Another medication with the same name was removed. Continue taking this medication, and follow the directions you see here.   Eliquis 5 MG Tabs tablet Generic drug: apixaban Take 5 mg by mouth 2 (two) times daily.   levothyroxine 75 MCG tablet Commonly known as: SYNTHROID TAKE 1 TABLET DAILY BEFORE BREAKFAST (NEED APPOINTMENT FOR FURTHER REFILLS) What changed: See the new instructions.   lidocaine-prilocaine cream Commonly known as: EMLA Apply to  Port-A-Cath site 1 to 2 hours prior to use   Magnesium 250 MG Tabs Take 250 mg by mouth at bedtime.   metoprolol tartrate 25 MG tablet Commonly known as: LOPRESSOR Take 1 tablet (25 mg total) by mouth 2 (two) times daily.   nystatin powder Commonly known as: MYCOSTATIN/NYSTOP Apply topically 3 (three) times daily.   polyethylene glycol 17 g packet Commonly known as: MiraLax Take 17 g by mouth daily. What changed:  when to take  this reasons to take this   pravastatin 40 MG tablet Commonly known as: PRAVACHOL Take 40 mg by mouth at bedtime.   senna-docusate 8.6-50 MG tablet Commonly known as: Senokot-S Take 1 tablet by mouth 2 (two) times daily.   traZODone 50 MG tablet Commonly known as: DESYREL Take 1 tablet (50 mg total) by mouth at bedtime as needed for sleep.               Durable Medical Equipment  (From admission, onward)           Start     Ordered   04/25/23 1139  For home use only DME Hospital bed  Once       Question Answer Comment  Length of Need Lifetime   Bed type Semi-electric      04/25/23 1138            Discharge Exam: Filed Weights   04/20/23 2140  Weight: 99.7 kg   Exam:  Constitutional:  The patient is awake, alert, and oriented x 3. No acute distress. Eyes:  pupils and irises appear normal Normal lids and conjunctivae ENMT:  grossly normal hearing  Lips appear normal external ears, nose appear normal Oropharynx: mucosa, tongue,posterior pharynx appear normal Neck:  neck appears normal, no masses, normal ROM, supple no thyromegaly Respiratory:  No increased work of breathing. No wheezes, rales, or rhonchi No tactile fremitus Cardiovascular:  Regular rate and rhythm No murmurs, ectopy, or gallups. No lateral PMI. No thrills. Abdomen:  Abdomen is soft, non-tender, non-distended No hernias, masses, or organomegaly Normoactive bowel sounds.  Musculoskeletal:  No cyanosis, clubbing, or edema Skin:  No  rashes, lesions, ulcers palpation of skin: no induration or nodules Neurologic:  CN 2-12 intact Sensation all 4 extremities intact Psychiatric:  Mental status Mood, affect appropriate Orientation to person, place, time  judgment and insight appear intact   Condition at discharge: fair  The results of significant diagnostics from this hospitalization (including imaging, microbiology, ancillary and laboratory) are listed below for reference.   Imaging Studies: DG FL GUIDED LUMBAR PUNCTURE Result Date: 04/23/2023 CLINICAL DATA:  Brain mass. EXAM: DIAGNOSTIC LUMBAR PUNCTURE UNDER FLUOROSCOPIC GUIDANCE COMPARISON:  None Available. FLUOROSCOPY: Radiation Exposure Index (as provided by the fluoroscopic device): 7.3 mGy Kerma PROCEDURE: Informed consent was obtained from the patient prior to the procedure, including potential complications of headache, allergy, and pain. With the patient prone, the lower back was prepped with Betadine. 1% Lidocaine was used for local anesthesia. Lumbar puncture was performed at the L3-4 level using a 20 gauge needle with return of clear CSF. 12 ml of CSF were obtained for laboratory studies. The patient tolerated the procedure well and there were no apparent complications. IMPRESSION: Successful lumbar puncture under fluoroscopy. Electronically Signed   By: Shearon Denis M.D.   On: 04/23/2023 11:56   MR BRAIN W WO CONTRAST Result Date: 04/21/2023 CLINICAL DATA:  Provided history: Brain/CNS neoplasm, staging. Additional history obtained from electronic MEDICAL RECORD NUMBERHistory of Hodgkin's lymphoma. EXAM: MRI HEAD WITHOUT AND WITH CONTRAST TECHNIQUE: Multiplanar, multiecho pulse sequences of the brain and surrounding structures were obtained without and with intravenous contrast. CONTRAST:  10mL GADAVIST GADOBUTROL 1 MMOL/ML IV SOLN COMPARISON:  Head CT 04/20/2023. Brain MRI 04/27/2020. FINDINGS: Intermittently motion degraded examination. Most notably, the coronal T2  sequence is moderate-to-severely motion degraded. Within this limitation, findings are as follows. Brain: Mild generalized cerebral atrophy. 2.6 x 1.4 x 1.8 cm homogeneously enhancing mass centered within the left aspect of  the callosal splenium. Corresponding diffusion weighted signal abnormality. Mild associated edema. Mild partial effacement of the posterior body and atrium of the left lateral ventricle. Smooth abnormal enhancement is also noted along portions of both lateral ventricles (most notably the frontal horns)(for instance as seen on series 18, image 12). Mild multifocal T2 FLAIR hyperintense signal abnormality elsewhere within the cerebral white matter, nonspecific but compatible chronic small vessel ischemic disease. There is no acute infarct. No chronic intracranial blood products. No extra-axial fluid collection. No midline shift or hydrocephalus. Vascular: Maintained flow voids within the proximal large arterial vessels. Skull and upper cervical spine: No focal worrisome marrow lesion. Incompletely assessed cervical spondylosis. Sinuses/Orbits: No mass or acute finding within the imaged orbits. No significant paranasal sinus disease. IMPRESSION: 1. Intermittently motion degraded exam. 2. 2.6 x 1.4 x 1.8 cm enhancing mass centered within the left aspect of the callosal splenium with mild associated edema and mild effacement of the left lateral ventricle. Additionally, smooth abnormal enhancement is also noted along portions of both lateral ventricles (most notably the frontal horns). Lymphoma is favored. However, alternative considerations include a primary CNS neoplasm or metastasis with intraventricular dissemination of disease. If not already performed, consider a lumbar puncture for CSF sampling. Additionally, consider a total spine MRI (with and without contrast) for further evaluation. 3. Background parenchymal atrophy and mild cerebral white matter chronic small vessel ischemic disease.  Electronically Signed   By: Bascom Lily D.O.   On: 04/21/2023 18:13   CT HEAD WO CONTRAST ( ) Result Date: 04/21/2023 CLINICAL DATA:  Initial evaluation for delirium. EXAM: CT HEAD WITHOUT CONTRAST TECHNIQUE: Contiguous axial images were obtained from the base of the skull through the vertex without intravenous contrast. RADIATION DOSE REDUCTION: This exam was performed according to the departmental dose-optimization program which includes automated exposure control, adjustment of the mA and/or kV according to patient size and/or use of iterative reconstruction technique. COMPARISON:  None Available. FINDINGS: Brain: Cerebral volume within normal limits. No acute intracranial hemorrhage. No acute large vessel territory infarct. Well-circumscribed ovoid hyperdense mildly expansile mass positioned at the left aspect of the splenium measures up to 2.7 cm (series 2, image 17). Mild surrounding edema without significant regional mass effect. This appears intra-axial in location. No other mass lesion, mass effect or midline shift. No hydrocephalus or extra-axial fluid collection. Vascular: No abnormal hyperdense vessel. Skull: Scalp soft tissues within normal limits.  Calvarium intact. Sinuses/Orbits: Globes orbital soft tissues within normal limits. Paranasal sinuses are clear. No mastoid effusion. Other: None. IMPRESSION: 1. 2.7 cm hyperdense intra-axial mass positioned at the left aspect of the splenium. Mild surrounding edema without significant regional mass effect. Finding is nonspecific, but concerning for a neoplastic process, with lymphoma a consideration given patient history as well as the periventricular location. Further evaluation with dedicated brain MRI, with and without contrast, recommended for further evaluation. 2. No other acute intracranial abnormality. Electronically Signed   By: Virgia Griffins M.D.   On: 04/21/2023 00:17   DG Chest Portable 1 View Result Date: 04/20/2023 CLINICAL DATA:   Shortness of breath.  Fatigue. EXAM: PORTABLE CHEST 1 VIEW COMPARISON:  Radiograph 02/04/2023 FINDINGS: Right chest port in place. Mild cardiomegaly, stable. Mediastinal contours are unchanged. Subsegmental atelectasis or scarring at the left lung base. No acute airspace disease, pleural effusion, pulmonary edema or pneumothorax. IMPRESSION: Mild chronic cardiomegaly. Subsegmental atelectasis or scarring at the left lung base. Electronically Signed   By: Chadwick Colonel M.D.   On: 04/20/2023 16:14  CT CARDIAC MORPH/PULM VEIN W/CM&W/O CA SCORE Addendum Date: 04/17/2023 ADDENDUM REPORT: 04/17/2023 02:57 EXAM: OVER-READ INTERPRETATION  CT CHEST The following report is an over-read performed by radiologist Dr. Violeta Grey of [facility] on 04/17/2023. This over-read does not include interpretation of cardiac or coronary anatomy or pathology. The coronary calcium score/coronary CTA interpretation by the cardiologist is attached. COMPARISON:  None. FINDINGS: Cardiovascular: No significant atherosclerotic calcifications are noted. Left atrial appendage occlusion device is seen. Central catheter is noted as well. Mediastinum/Nodes: There are no enlarged lymph nodes within the visualized mediastinum. Lungs/Pleura: There is no pleural effusion. The visualized lungs appear clear. Upper abdomen: No significant findings in the visualized upper abdomen. Musculoskeletal/Chest wall: No chest wall mass or suspicious osseous findings within the visualized chest. IMPRESSION: No significant extracardiac findings within the visualized chest. Electronically Signed   By: Violeta Grey M.D.   On: 04/17/2023 02:57   Result Date: 04/17/2023 CLINICAL DATA:  Follow up for a Watchman Flex Device EXAM: Cardiac CT/CTA TECHNIQUE: The patient was scanned on a Siemens Somatom scanner. FINDINGS: A 120 kV prospective scan was triggered in the descending thoracic aorta at 111 HU's. Gantry rotation speed was 280 msecs and collimation was  .9 mm. No beta blockade and no NTG was given. The 3D data set was reconstructed in 5% intervals of the 60-80 % of the R-R cycle. Systolic phases were analyzed on a dedicated work station using MPR, MIP and VRT modes. The patient received 95 cc of contrast. Device Position and stability Size: 27 mm Position: No evidence of device migration or embolization Average compression: 15 % Mitral shoulder: Present, less then 1/2 shoulder Occlusion Assessment Complete occlusion of the device Presence of contrast in the distal appendage to the device suggesting incomplete endothelialization Peridevice leak: Absent There is no evidence of device related thrombus. Interatrial septum: No communications. Non-appendage cardiac findings: Aorta:  Normal caliber.  No dissection.  No calcification. Main pulmonary artery: Mild dilation 30 mm Aortic Valve:  Tri-leaflet. No calcification. Chamber dimensions: Severe right atrial dilation. Venous drainage: Normal pulmonary vein anatomy. Valve assessment: Mitral annular calcification noted. There is a catheter that terminates in the right atrium. Coronary Arteries: Normal coronary origin. The study was performed without use of NTG and insufficient for plaque evaluation. Coronary Calcium Score: 414; increased from 2023. Extra-cardiac findings: See attached radiology report for non-cardiac structures. IMPRESSION: 1. Successful left atrial appendage occlusion with complete seal. 2. Absence of peridevice leak. 3. Absence of device-related thrombus. 4. Stable device position without evidence of migration or embolization. Gloriann Larger, MD Electronically Signed: By: Gloriann Larger M.D. On: 04/07/2023 15:20    Microbiology: Results for orders placed or performed during the hospital encounter of 04/20/23  Resp panel by RT-PCR (RSV, Flu A&B, Covid) Anterior Nasal Swab     Status: None   Collection Time: 04/20/23  2:22 PM   Specimen: Anterior Nasal Swab  Result Value Ref Range  Status   SARS Coronavirus 2 by RT PCR NEGATIVE NEGATIVE Final    Comment: (NOTE) SARS-CoV-2 target nucleic acids are NOT DETECTED.  The SARS-CoV-2 RNA is generally detectable in upper respiratory specimens during the acute phase of infection. The lowest concentration of SARS-CoV-2 viral copies this assay can detect is 138 copies/mL. A negative result does not preclude SARS-Cov-2 infection and should not be used as the sole basis for treatment or other patient management decisions. A negative result may occur with  improper specimen collection/handling, submission of specimen other than nasopharyngeal  swab, presence of viral mutation(s) within the areas targeted by this assay, and inadequate number of viral copies(<138 copies/mL). A negative result must be combined with clinical observations, patient history, and epidemiological information. The expected result is Negative.  Fact Sheet for Patients:  BloggerCourse.com  Fact Sheet for Healthcare Providers:  SeriousBroker.it  This test is no t yet approved or cleared by the [place]  FDA and  has been authorized for detection and/or diagnosis of SARS-CoV-2 by FDA under an Emergency Use Authorization (EUA). This EUA will remain  in effect (meaning this test can be used) for the duration of the COVID-19 declaration under Section 564(b)(1) of the Act, 21 U.S.C.section 360bbb-3(b)(1), unless the authorization is terminated  or revoked sooner.       Influenza A by PCR NEGATIVE NEGATIVE Final   Influenza B by PCR NEGATIVE NEGATIVE Final    Comment: (NOTE) The Xpert Xpress SARS-CoV-2/FLU/RSV plus assay is intended as an aid in the diagnosis of influenza from Nasopharyngeal swab specimens and should not be used as a sole basis for treatment. Nasal washings and aspirates are unacceptable for Xpert Xpress SARS-CoV-2/FLU/RSV testing.  Fact Sheet for  Patients: BloggerCourse.com  Fact Sheet for Healthcare Providers: SeriousBroker.it  This test is not yet approved or cleared by the [place]  FDA and has been authorized for detection and/or diagnosis of SARS-CoV-2 by FDA under an Emergency Use Authorization (EUA). This EUA will remain in effect (meaning this test can be used) for the duration of the COVID-19 declaration under Section 564(b)(1) of the Act, 21 U.S.C. section 360bbb-3(b)(1), unless the authorization is terminated or revoked.     Resp Syncytial Virus by PCR NEGATIVE NEGATIVE Final    Comment: (NOTE) Fact Sheet for Patients: BloggerCourse.com  Fact Sheet for Healthcare Providers: SeriousBroker.it  This test is not yet approved or cleared by the [place]  FDA and has been authorized for detection and/or diagnosis of SARS-CoV-2 by FDA under an Emergency Use Authorization (EUA). This EUA will remain in effect (meaning this test can be used) for the duration of the COVID-19 declaration under Section 564(b)(1) of the Act, 21 U.S.C. section 360bbb-3(b)(1), unless the authorization is terminated or revoked.  Performed at [facility], [address], [place], [place] [address]   MRSA Next Gen by PCR, Nasal     Status: None   Collection Time: 04/20/23 10:01 PM   Specimen: Nasal Mucosa; Nasal Swab  Result Value Ref Range Status   MRSA by PCR Next Gen NOT DETECTED NOT DETECTED Final    Comment: (NOTE) The GeneXpert MRSA Assay (FDA approved for NASAL specimens only), is one component of a comprehensive MRSA colonization surveillance program. It is not intended to diagnose MRSA infection nor to guide or monitor treatment for MRSA infections. Test performance is not FDA approved in patients less than 52 years old. Performed at [facility], [address], [place], [place]  [address]   Urine Culture (for pregnant, neutropenic or urologic patients or patients with an indwelling urinary catheter)     Status: Abnormal   Collection Time: 04/21/23  7:30 AM   Specimen: Urine, Clean Catch  Result Value Ref Range Status   Specimen Description   Final    URINE, CLEAN CATCH Performed at [facility], [address], [place], [place] [address]    Special Requests   Final    NONE Performed at [facility], [address] [place], [place] [address]    Culture  20,000 COLONIES/mL ESCHERICHIA COLI (A)  Final   Report Status 04/23/2023 FINAL  Final   Organism ID, Bacteria ESCHERICHIA COLI (A)  Final      Susceptibility   Escherichia coli - MIC*    AMPICILLIN 8 SENSITIVE Sensitive     CEFAZOLIN <=4 SENSITIVE Sensitive     CEFEPIME <=0.12 SENSITIVE Sensitive     CEFTRIAXONE <=0.25 SENSITIVE Sensitive     CIPROFLOXACIN <=0.25 SENSITIVE Sensitive     GENTAMICIN <=1 SENSITIVE Sensitive     IMIPENEM <=0.25 SENSITIVE Sensitive     NITROFURANTOIN <=16 SENSITIVE Sensitive     TRIMETH/SULFA <=20 SENSITIVE Sensitive     AMPICILLIN/SULBACTAM <=2 SENSITIVE Sensitive     PIP/TAZO <=4 SENSITIVE Sensitive ug/mL    * 20,000 COLONIES/mL ESCHERICHIA COLI  CSF culture w Gram Stain     Status: None (Preliminary result)   Collection Time: 04/23/23 11:10 AM   Specimen: PATH Cytology CSF; Cerebrospinal Fluid  Result Value Ref Range Status   Specimen Description   Final    CSF Performed at [facility], [address] [place], [place] [address]    Special Requests   Final    NONE Performed at [facility], [address] [place], [place] [address]    Gram Stain   Final    NO ORGANISMS SEEN Performed at [facility], [address] [place], [place] [address]    Culture   Final    NO GROWTH 2 DAYS Performed at [facility], [address] [place], [place] [address]    Report  Status PENDING  Incomplete    Labs: CBC: Recent Labs  Lab 04/20/23 1422 04/21/23 0248 04/22/23 0500 04/23/23 0508 04/24/23 0654 04/25/23 0344  WBC 5.1 3.6* 2.8* 2.9* 6.2 3.7*  NEUTROABS 2.9 1.7  --   --   --  2.2  HGB 15.5* 12.4 12.7 12.0 12.1 11.5*  HCT 45.8 37.8 39.4 36.5 36.7 36.2  MCV 99.8 100.5* 101.8* 100.6* 101.4* 103.4*  PLT 76* 63* 58* 72* 89* 72*   Basic Metabolic Panel: Recent Labs  Lab 04/20/23 1422 04/21/23 0248 04/22/23 0500 04/24/23 0654 04/25/23 0344  NA 134* 129* 135 137 138  K 4.1 3.2* 4.4 4.2 3.8  CL 97* 97* 105 107 108  CO2 24 23 24 24 25   GLUCOSE 135* 127* 112* 141* 135*  BUN 31* 29* 17 22 28*  CREATININE 1.94* 1.56* 1.19* 1.11* 1.30*  CALCIUM 9.6 8.4* 8.8* 8.9 8.4*  MG 2.1  --   --   --   --    Liver Function Tests: Recent Labs  Lab 04/20/23 1422  AST 24  ALT 15  ALKPHOS 85  BILITOT 1.4*  PROT 6.8  ALBUMIN 3.9   CBG: Recent Labs  Lab 04/22/23 1229 04/25/23 0819 04/25/23 1210  GLUCAP 113* 136* 151*    Discharge time spent: greater than 30 minutes.  Signed: Nasean Zapf, DO [facility] 04/25/2023

## 2023-04-25 NOTE — Progress Notes (Signed)
 Daily Progress Note   Patient Name: [name] [name]       Date: 04/25/2023 DOB: Sep 22, 1945  Age: 78 y.o. MRN#: [id number] Attending Physician: Fran Lowes, DO Primary Care Physician: Laurann Montana, MD Admit Date: 04/20/2023 Length of Stay: 5 days  Reason for Consultation/Follow-up: Establishing goals of care  Subjective:   CC: Patient denies any symptoms of concern at this time.  Wanting to go home.  Following up regarding complex medical decision making.  Subjective:  Reviewed EMR prior to presenting to bedside.  Reviewed oncology note from today.  Patient planning to follow-up in outpatient setting to discuss results of the CSF cytology and options for cancer directed therapies moving forward including possibility of palliative radiation and/or chemotherapy.  Presented to bedside to see patient.  Patient's son present at bedside.  Able to introduce myself as a member of the palliative medicine team.  Patient's son noting patient wanting to go home today after speaking with oncologist.  Patient notes her plan is to follow-up with Dr. Truett Perna in the outpatient setting later this week to discuss possible cancer directed therapies moving forward.  Acknowledged this.  Son notes they are requesting hospital bed for home.  Noted would inform hospitalist and TOC of this. Did spend time answering questions regarding palliative medicine versus hospice.  Explained that if patient plans to receive further cancer directed therapies, would need follow-up with palliative medicine team potentially at [facility] while seeing Dr. Truett Perna to assist with symptom management and care planning.  Noted patient could receive hospice should she opt to not pursue further cancer directed therapies.  Explained generalities around services and that they could provide DME for the home.  Son acknowledged this.  Patient stated plan to follow-up with Dr. Truett Perna so noted would place referral for ambulatory palliative clinic for  follow-up.  Informed later in the day that hospitalist had followed up with patient and son and they are now electing for patient to go home with hospice support.  They have chosen [facility] hosp[facility].  Hospitalist coordinating patient going home with [facility] hospice support.  Plan for discharge today.   Objective:   Vital Signs:  BP (!) 92/54 (BP Location: Right Arm)   Pulse 83   Temp (!) 97.5 F (36.4 C) (Oral)   Resp 20   Ht 5\' 7"  (1.702 m)   Wt 99.7 kg   SpO2 98%   BMI 34.43 kg/m   Physical Exam: General: NAD, awake, alert, chronically ill-appearing Cardiovascular: Tachycardia noted Respiratory: no increased work of breathing noted, not in respiratory distress Neuro:  awake, interactive, following commands appropriately   Imaging:  I personally reviewed recent imaging.   Assessment & Plan:   Assessment: Patient is a 78 year old female with a past medical history of permanent A-fib status post watchman implant not on anticoagulation, hypothyroidism, hyperlipidemia, anxiety, CKD, HFpEF, hypertension, and non-Hodgkin's lymphoma in remission who was admitted on 04/20/2023 for management of weakness, fatigue, and confusion.  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  Oncology consulted.  Concern for recurrence of lymphoma versus primary CNS tumor.  Cardiology also consulted in setting of A-fib with RVR.  Palliative medicine team consulted to assist with complex medical decision making.   Recommendations/Plan: # Complex medical decision making/goals of care:  - Discussed care with patient while son at bedside as detailed above in HPI.  Had spent time  discussing palliative medicine support versus hospice support.  Initially patient electing to follow-up with oncology to discuss further cancer directed therapies so palliative medicine  referral was placed.  Then after further discussion, hospitalist noted patient and family have elected for patient to go home with hospice support.  [facility] hospice contacted for follow-up.  Plan for patient to discharge today with hospice support at home.                -  Code Status: Limited: Do not attempt resuscitation (DNR) -DNR-LIMITED -Do Not Intubate/DNI   - Support System: Husband, daughters   # Discharge Planning:  To Be Determined  Discussed with: Patient, patient's son at bedside, RN, hospitalist, [facility], [facility] liaison  Thank you for allowing the palliative care team to participate in the care Aowyn W Lor.  Barnett Libel, DO Palliative Care Provider PMT # [phone]  If patient remains symptomatic despite maximum doses, please call PMT at [phone] between 0700 and 1900. Outside of these hours, please call attending, as PMT does not have night coverage.  Personally spent 35 minutes in patient care including extensive chart review (labs, imaging, progress/consult notes, vital signs), medically appropraite exam, discussed with treatment team, education to patient, family, and staff, documenting clinical information, medication review and management, coordination of care, and available advanced directive documents.

## 2023-04-25 NOTE — Plan of Care (Signed)
  Problem: Education: Goal: Knowledge of General Education information will improve Description: Including pain rating scale, medication(s)/side effects and non-pharmacologic comfort measures 04/25/2023 1242 by Coletta Davidson, RN Outcome: Adequate for Discharge 04/25/2023 [phone] by Coletta Davidson, RN Outcome: Progressing   Problem: Health Behavior/Discharge Planning: Goal: Ability to manage health-related needs will improve 04/25/2023 1242 by Coletta Davidson, RN Outcome: Adequate for Discharge 04/25/2023 0958 by Coletta Davidson, RN Outcome: Progressing   Problem: Clinical Measurements: Goal: Ability to maintain clinical measurements within normal limits will improve 04/25/2023 1242 by Coletta Davidson, RN Outcome: Adequate for Discharge 04/25/2023 0958 by Coletta Davidson, RN Outcome: Progressing Goal: Will remain free from infection 04/25/2023 1242 by Coletta Davidson, RN Outcome: Adequate for Discharge 04/25/2023 [phone] by Coletta Davidson, RN Outcome: Progressing Goal: Diagnostic test results will improve 04/25/2023 1242 by Coletta Davidson, RN Outcome: Adequate for Discharge 04/25/2023 0958 by Coletta Davidson, RN Outcome: Progressing Goal: Respiratory complications will improve 04/25/2023 1242 by Coletta Davidson, RN Outcome: Adequate for Discharge 04/25/2023 0958 by Coletta Davidson, RN Outcome: Progressing Goal: Cardiovascular complication will be avoided 04/25/2023 1242 by Coletta Davidson, RN Outcome: Adequate for Discharge 04/25/2023 0958 by Coletta Davidson, RN Outcome: Progressing   Problem: Activity: Goal: Risk for activity intolerance will decrease 04/25/2023 1242 by Coletta Davidson, RN Outcome: Adequate for Discharge 04/25/2023 [phone] by Coletta Davidson, RN Outcome: Progressing   Problem: Nutrition: Goal: Adequate nutrition will be maintained 04/25/2023 1242 by Coletta Davidson, RN Outcome:  Adequate for Discharge 04/25/2023 0958 by Coletta Davidson, RN Outcome: Progressing   Problem: Coping: Goal: Level of anxiety will decrease 04/25/2023 1242 by Coletta Davidson, RN Outcome: Adequate for Discharge 04/25/2023 0958 by Coletta Davidson, RN Outcome: Progressing   Problem: Elimination: Goal: Will not experience complications related to bowel motility 04/25/2023 1242 by Coletta Davidson, RN Outcome: Adequate for Discharge 04/25/2023 0958 by Coletta Davidson, RN Outcome: Progressing Goal: Will not experience complications related to urinary retention 04/25/2023 1242 by Coletta Davidson, RN Outcome: Adequate for Discharge 04/25/2023 0958 by Coletta Davidson, RN Outcome: Progressing   Problem: Pain Managment: Goal: General experience of comfort will improve and/or be controlled 04/25/2023 1242 by Coletta Davidson, RN Outcome: Adequate for Discharge 04/25/2023 0958 by Coletta Davidson, RN Outcome: Progressing   Problem: Safety: Goal: Ability to remain free from injury will improve 04/25/2023 1242 by Coletta Davidson, RN Outcome: Adequate for Discharge 04/25/2023 0958 by Coletta Davidson, RN Outcome: Progressing   Problem: Skin Integrity: Goal: Risk for impaired skin integrity will decrease 04/25/2023 1242 by Coletta Davidson, RN Outcome: Adequate for Discharge 04/25/2023 0958 by Coletta Davidson, RN Outcome: Progressing

## 2023-04-25 NOTE — TOC Progression Note (Signed)
 Transition of Care [facility]) - Progression Note    Patient Details  Name: [name] [name] MRN: [id number] Date of Birth: 09/15/1945  [facility]) CM/SW Contact  Amaryllis Junior, [place] Phone Number: 04/25/2023, 11:22 AM  Clinical Narrative:    Pt needing hospital bed delivered to home prior to d/c. Hospital bed ordered through [facility]. Pt wished to return home today, DME unable to deliver hospital bed until next day. MD notified on need for DME order to be placed.    Expected Discharge Plan: Home w Home Health Services Barriers to Discharge: Continued Medical Work up  Expected Discharge Plan and Services       Living arrangements for the past 2 months: Single Family Home                                       Social Determinants of Health (SDOH) Interventions SDOH Screenings   Food Insecurity: No Food Insecurity (04/20/2023)  Housing: Low Risk  (04/21/2023)  Transportation Needs: No Transportation Needs (04/20/2023)  Utilities: Not At Risk (04/20/2023)  Financial Resource Strain: Low Risk  (11/25/2016)  Physical Activity: Unknown (11/25/2016)  Social Connections: Moderately Integrated (04/20/2023)  Stress: Stress Concern Present (11/25/2016)  Tobacco Use: Low Risk  (04/23/2023)    Readmission Risk Interventions    04/21/2023   11:31 AM 09/09/2021   10:42 AM 09/01/2021    8:52 AM  Readmission Risk Prevention Plan  Transportation Screening Complete Complete Complete  PCP or Specialist Appt within 5-7 Days Complete    PCP or Specialist Appt within 3-5 Days   Complete  Home Care Screening Complete    Medication Review (RN CM) Complete    HRI or Home Care Consult   Complete  Social Work Consult for Recovery Care Planning/Counseling   Complete  Palliative Care Screening   Not Applicable  Medication Review [employer])  Complete Complete  PCP or Specialist appointment within 3-5 days of discharge  Complete   HRI or Home Care Consult  Complete   SW Recovery  Care/Counseling Consult  Complete   Palliative Care Screening  Not Applicable   Skilled Nursing Facility  Not Applicable

## 2023-04-25 NOTE — Plan of Care (Signed)

## 2023-04-25 NOTE — Progress Notes (Signed)
 [name] [name] [address] [facility] Liaison Note   Received request from [place], Transitions of Care Manager, for hospice services at home after discharge. Spoke with patient son [name] [name] to initiate education related to hospice philosophy, services, and team approach to care. Patient/family verbalized understanding of information given. Per discussion, the plan is for discharge home by private vehicle on today.   DME needs discussed. Patient has the following equipment in the home:  Bedside Commode, Manual Wheelchair, and [name]  Patient/family requests the following equipment for delivery: Hospital Bed and Overbed bedside table    The address has been verified and is correct in the chart. [name] [name] provided sister [name] [name]'s contact information to arrange for delivery of equipment.     Please send signed and completed DNR home with patient/family. Please provide prescriptions at discharge as needed to ensure ongoing symptom management.   [facility] information and contact numbers given to [name] [name] (patient son)   Above information shared TOC, and PMT via secure chat.   Please call with any questions or concerns.   Thank you for the opportunity to participate in this patient's care.    Jacqlyn Matas, BSN, [facility] Liaison [phone]

## 2023-04-25 NOTE — Progress Notes (Signed)
 [name] [name]   DOB:1945-03-31   RU#:[id number]      ASSESSMENT & PLAN:  Non-Hodgkin's lymphoma Large B cell - Previously in remission, now with suspected recurrence -Diagnosed 08/2020.  Status post treatment with R-CHOP, Udenyca in 2023. - CT head done 04/20/2023 shows 2.7 hyperdense intra axial mass left aspect of splenium.  Finding is nonspecific but concerning for neoplastic process given history of lymphoma. -MRI brain done 04/21/2023 shows 2.6 x 1.4 x 1.8 cm enhancing mass, lymphoma favored although alternative including primary CNS neoplasm or mets cannot be excluded. - Diagnostic LP for CSF 04/23/2023, lymphocytosis, elevated protein, cytology pending   Fatigue/generalized Weakness Confusion - Patient with multiple comorbidities.  Admitted 04/20/2023 with complaints of generalized weakness and fatigue.  Family states patient has been intermittently confused for several days. - Her husband and son are present this morning, she remains confused   Thrombocytopenia  Chronic A-fib -Status post recent Watchman implant 03/2023    Hypertension Hyperlipidemia -Continue to monitor blood pressure and administer antihypertensives - On statin - Monitor labs   CKD     Code Status Full  Subjective:  She reports has no new complaint.  She denies pain.  She wants to go home.  Objective:  Vitals:   04/24/23 2347 04/25/23 0537  BP: (!) 97/57 (!) 92/54  Pulse: (!) 56 83  Resp: 20 16  Temp: 97.8 F (36.6 C) (!) 97.5 F (36.4 C)  SpO2: 98% 98%    No intake or output data in the 24 hours ending 04/25/23 0650    PHYSICAL EXAMINATION: ECOG PERFORMANCE STATUS: 4 - Bedbound  Vitals:   04/24/23 2347 04/25/23 0537  BP: (!) 97/57 (!) 92/54  Pulse: (!) 56 83  Resp: 20 16  Temp: 97.8 F (36.6 C) (!) 97.5 F (36.4 C)  SpO2: 98% 98%   Filed Weights   04/20/23 2140  Weight: 219 lb 12.8 oz (99.7 kg)    OROPHARYNX: no thrush  LUNGS: clear  HEART: Irregular Abdomen: Nontender, no  hepatosplenomegaly NEURO: Alert, the motor exam appears intact in the upper and lower extremities, not oriented to place.  Oriented to year and month, not place Skin: Resolving yeast rash beneath the left breast   Coni Deep, MD 04/25/2023 6:50 AM    Labs Reviewed:  Lab Results  Component Value Date   WBC 3.7 (L) 04/25/2023   HGB 11.5 (L) 04/25/2023   HCT 36.2 04/25/2023   MCV 103.4 (H) 04/25/2023   PLT 72 (L) 04/25/2023   Recent Labs    08/11/22 0848 01/11/23 9147 03/09/23 0909 04/20/23 1422 04/21/23 0248 04/22/23 0500 04/24/23 0654 04/25/23 0344  NA 141 138   < > 134*   < > 135 137 138  K 4.1 4.3   < > 4.1   < > 4.4 4.2 3.8  CL 108 104   < > 97*   < > 105 107 108  CO2 28 29   < > 24   < > 24 24 25   GLUCOSE 117* 123*   < > 135*   < > 112* 141* 135*  BUN 15 23   < > 31*   < > 17 22 28*  CREATININE 1.16* 1.25*   < > 1.94*   < > 1.19* 1.11* 1.30*  CALCIUM 9.0 9.1   < > 9.6   < > 8.8* 8.9 8.4*  GFRNONAA 49* 45*   < > 26*   < > 47* 51* 42*  PROT 6.2* 6.2*  --  6.8  --   --   --   --   ALBUMIN 4.0 3.9  --  3.9  --   --   --   --   AST 16 13*  --  24  --   --   --   --   ALT 11 13  --  15  --   --   --   --   ALKPHOS 73 72  --  85  --   --   --   --   BILITOT 0.4 0.5  --  1.4*  --   --   --   --    < > = values in this interval not displayed.    Studies Reviewed:  DG FL GUIDED LUMBAR PUNCTURE Result Date: 04/23/2023 CLINICAL DATA:  Brain mass. EXAM: DIAGNOSTIC LUMBAR PUNCTURE UNDER FLUOROSCOPIC GUIDANCE COMPARISON:  None Available. FLUOROSCOPY: Radiation Exposure Index (as provided by the fluoroscopic device): 7.3 mGy Kerma PROCEDURE: Informed consent was obtained from the patient prior to the procedure, including potential complications of headache, allergy, and pain. With the patient prone, the lower back was prepped with Betadine. 1% Lidocaine was used for local anesthesia. Lumbar puncture was performed at the L3-4 level using a 20 gauge needle with return of clear CSF.  12 ml of CSF were obtained for laboratory studies. The patient tolerated the procedure well and there were no apparent complications. IMPRESSION: Successful lumbar puncture under fluoroscopy. Electronically Signed   By: Shearon Denis M.D.   On: 04/23/2023 11:56   MR BRAIN W WO CONTRAST Result Date: 04/21/2023 CLINICAL DATA:  Provided history: Brain/CNS neoplasm, staging. Additional history obtained from electronic MEDICAL RECORD NUMBERHistory of Hodgkin's lymphoma. EXAM: MRI HEAD WITHOUT AND WITH CONTRAST TECHNIQUE: Multiplanar, multiecho pulse sequences of the brain and surrounding structures were obtained without and with intravenous contrast. CONTRAST:  10mL GADAVIST GADOBUTROL 1 MMOL/ML IV SOLN COMPARISON:  Head CT 04/20/2023. Brain MRI 04/27/2020. FINDINGS: Intermittently motion degraded examination. Most notably, the coronal T2 sequence is moderate-to-severely motion degraded. Within this limitation, findings are as follows. Brain: Mild generalized cerebral atrophy. 2.6 x 1.4 x 1.8 cm homogeneously enhancing mass centered within the left aspect of the callosal splenium. Corresponding diffusion weighted signal abnormality. Mild associated edema. Mild partial effacement of the posterior body and atrium of the left lateral ventricle. Smooth abnormal enhancement is also noted along portions of both lateral ventricles (most notably the frontal horns)(for instance as seen on series 18, image 12). Mild multifocal T2 FLAIR hyperintense signal abnormality elsewhere within the cerebral white matter, nonspecific but compatible chronic small vessel ischemic disease. There is no acute infarct. No chronic intracranial blood products. No extra-axial fluid collection. No midline shift or hydrocephalus. Vascular: Maintained flow voids within the proximal large arterial vessels. Skull and upper cervical spine: No focal worrisome marrow lesion. Incompletely assessed cervical spondylosis. Sinuses/Orbits: No mass or acute finding  within the imaged orbits. No significant paranasal sinus disease. IMPRESSION: 1. Intermittently motion degraded exam. 2. 2.6 x 1.4 x 1.8 cm enhancing mass centered within the left aspect of the callosal splenium with mild associated edema and mild effacement of the left lateral ventricle. Additionally, smooth abnormal enhancement is also noted along portions of both lateral ventricles (most notably the frontal horns). Lymphoma is favored. However, alternative considerations include a primary CNS neoplasm or metastasis with intraventricular dissemination of disease. If not already performed, consider a lumbar puncture for CSF sampling. Additionally, consider a total spine MRI (with and without  contrast) for further evaluation. 3. Background parenchymal atrophy and mild cerebral white matter chronic small vessel ischemic disease. Electronically Signed   By: Bascom Lily D.O.   On: 04/21/2023 18:13   CT HEAD WO CONTRAST ( ) Result Date: 04/21/2023 CLINICAL DATA:  Initial evaluation for delirium. EXAM: CT HEAD WITHOUT CONTRAST TECHNIQUE: Contiguous axial images were obtained from the base of the skull through the vertex without intravenous contrast. RADIATION DOSE REDUCTION: This exam was performed according to the departmental dose-optimization program which includes automated exposure control, adjustment of the mA and/or kV according to patient size and/or use of iterative reconstruction technique. COMPARISON:  None Available. FINDINGS: Brain: Cerebral volume within normal limits. No acute intracranial hemorrhage. No acute large vessel territory infarct. Well-circumscribed ovoid hyperdense mildly expansile mass positioned at the left aspect of the splenium measures up to 2.7 cm (series 2, image 17). Mild surrounding edema without significant regional mass effect. This appears intra-axial in location. No other mass lesion, mass effect or midline shift. No hydrocephalus or extra-axial fluid collection. Vascular: No  abnormal hyperdense vessel. Skull: Scalp soft tissues within normal limits.  Calvarium intact. Sinuses/Orbits: Globes orbital soft tissues within normal limits. Paranasal sinuses are clear. No mastoid effusion. Other: None. IMPRESSION: 1. 2.7 cm hyperdense intra-axial mass positioned at the left aspect of the splenium. Mild surrounding edema without significant regional mass effect. Finding is nonspecific, but concerning for a neoplastic process, with lymphoma a consideration given patient history as well as the periventricular location. Further evaluation with dedicated brain MRI, with and without contrast, recommended for further evaluation. 2. No other acute intracranial abnormality. Electronically Signed   By: Virgia Griffins M.D.   On: 04/21/2023 00:17   DG Chest Portable 1 View Result Date: 04/20/2023 CLINICAL DATA:  Shortness of breath.  Fatigue. EXAM: PORTABLE CHEST 1 VIEW COMPARISON:  Radiograph 02/04/2023 FINDINGS: Right chest port in place. Mild cardiomegaly, stable. Mediastinal contours are unchanged. Subsegmental atelectasis or scarring at the left lung base. No acute airspace disease, pleural effusion, pulmonary edema or pneumothorax. IMPRESSION: Mild chronic cardiomegaly. Subsegmental atelectasis or scarring at the left lung base. Electronically Signed   By: Chadwick Colonel M.D.   On: 04/20/2023 16:14   CT CARDIAC MORPH/PULM VEIN W/CM&W/O CA SCORE Addendum Date: 04/17/2023 ADDENDUM REPORT: 04/17/2023 02:57 EXAM: OVER-READ INTERPRETATION  CT CHEST The following report is an over-read performed by radiologist Dr. Violeta Grey of [facility] on 04/17/2023. This over-read does not include interpretation of cardiac or coronary anatomy or pathology. The coronary calcium score/coronary CTA interpretation by the cardiologist is attached. COMPARISON:  None. FINDINGS: Cardiovascular: No significant atherosclerotic calcifications are noted. Left atrial appendage occlusion device is seen.  Central catheter is noted as well. Mediastinum/Nodes: There are no enlarged lymph nodes within the visualized mediastinum. Lungs/Pleura: There is no pleural effusion. The visualized lungs appear clear. Upper abdomen: No significant findings in the visualized upper abdomen. Musculoskeletal/Chest wall: No chest wall mass or suspicious osseous findings within the visualized chest. IMPRESSION: No significant extracardiac findings within the visualized chest. Electronically Signed   By: Violeta Grey M.D.   On: 04/17/2023 02:57   Result Date: 04/17/2023 CLINICAL DATA:  Follow up for a Watchman Flex Device EXAM: Cardiac CT/CTA TECHNIQUE: The patient was scanned on a Siemens Somatom scanner. FINDINGS: A 120 kV prospective scan was triggered in the descending thoracic aorta at 111 HU's. Gantry rotation speed was 280 msecs and collimation was .9 mm. No beta blockade and no NTG was given. The 3D data  set was reconstructed in 5% intervals of the 60-80 % of the R-R cycle. Systolic phases were analyzed on a dedicated work station using MPR, MIP and VRT modes. The patient received 95 cc of contrast. Device Position and stability Size: 27 mm Position: No evidence of device migration or embolization Average compression: 15 % Mitral shoulder: Present, less then 1/2 shoulder Occlusion Assessment Complete occlusion of the device Presence of contrast in the distal appendage to the device suggesting incomplete endothelialization Peridevice leak: Absent There is no evidence of device related thrombus. Interatrial septum: No communications. Non-appendage cardiac findings: Aorta:  Normal caliber.  No dissection.  No calcification. Main pulmonary artery: Mild dilation 30 mm Aortic Valve:  Tri-leaflet. No calcification. Chamber dimensions: Severe right atrial dilation. Venous drainage: Normal pulmonary vein anatomy. Valve assessment: Mitral annular calcification noted. There is a catheter that terminates in the right atrium. Coronary  Arteries: Normal coronary origin. The study was performed without use of NTG and insufficient for plaque evaluation. Coronary Calcium Score: 414; increased from 2023. Extra-cardiac findings: See attached radiology report for non-cardiac structures. IMPRESSION: 1. Successful left atrial appendage occlusion with complete seal. 2. Absence of peridevice leak. 3. Absence of device-related thrombus. 4. Stable device position without evidence of migration or embolization. Gloriann Larger, MD Electronically Signed: By: Gloriann Larger M.D. On: 04/07/2023 15:20   Ms. Valtierra was interviewed and examined.  Her husband was at the bedside when I saw her at approximately 7:30 AM.  I spoke to her son in the hall.  I explained the likely diagnosis of CNS lymphoma based on the imaging studies, elevated LDH, and the current CSF results.  The CSF cytology is pending. We again reviewed treatment options of comfort care, palliative radiation, and chemotherapy.  Ms. Cartelli is unsure whether she will agree to treatment.  She wants to go home.  Her husband is agreeable and says he will need help from other family members.  She agrees to a follow-up appointment at the Cancer center this week.       Recommendations: Follow-up CSF cytology Management of atrial fibrillation per the medical service and cardiology Discharge to home when arrangements are made for home care with husband/family Outpatient follow-up will be scheduled at the Cancer center

## 2023-04-26 LAB — CSF CULTURE W GRAM STAIN
Culture: NO GROWTH
Gram Stain: NONE SEEN

## 2023-04-27 LAB — CYTOLOGY - NON PAP

## 2023-04-28 ENCOUNTER — Ambulatory Visit: Admitting: Oncology

## 2023-04-29 ENCOUNTER — Inpatient Hospital Stay: Attending: Oncology | Admitting: Oncology

## 2023-04-29 DIAGNOSIS — C8598 Non-Hodgkin lymphoma, unspecified, lymph nodes of multiple sites: Secondary | ICD-10-CM | POA: Diagnosis not present

## 2023-04-29 NOTE — Progress Notes (Signed)
  Cancer Center   HEMATOLOGY- ONCOLOGY TeleHEALTH OFFICE VISIT PROGRESS NOTE  I connected with [name] [name] on 04/29/23 at 10:40 AM EDT by telephone and verified that I am speaking with the correct person using two identifiers.   I discussed the limitations, risks, security and privacy concerns of performing an evaluation and management service by telemedicine and the availability of in-person appointments. I also discussed with the patient that there may be a patient responsible charge related to this service. The patient expressed understanding and agreed to proceed.  Other persons participating in the visit and their role in the encounter: Daughter  Patient's location: Home Provider's location: Office   Diagnosis: Non-Hodgkin's lymphoma  INTERVAL HISTORY:   [name] [name] was discharged in the hospital on 04/25/2023.  She has enrolled in home hospice care.  Her daughter is present for today's telephone visit.  The daughter reports [name] [name] is less confused and has an improved appetite over the past few days.  She developed a mouth ulcer and has been prescribed mouthwash.  She fell in the bathroom.  [name] [name] complains of dyspnea, of the oxygen saturation has been normal.  She has been prescribed phenobarbital.  She sleeps during the day and is awake at night.   Lab Results:  Lab Results  Component Value Date   WBC 3.7 (L) 04/25/2023   HGB 11.5 (L) 04/25/2023   HCT 36.2 04/25/2023   MCV 103.4 (H) 04/25/2023   PLT 72 (L) 04/25/2023   NEUTROABS 2.2 04/25/2023     Medications: I have reviewed the patient's current medications.  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  Findings concerning for a low-grade  myelodysplastic syndrome.  Negative myeloma FISH panel, 46XX; neotype myeloid disorders profile NGS sequencing-no pathogenic mutations detected in any of the genes on the NGS panel -PNH screen - 08/26/2020 -CTs 02/17/2021-new splenomegaly; diffuse bilateral bronchial wall thickening; clustered groundglass and fine nodular opacity in the dependent right lower lobe.  CAD. -Bone marrow biopsy 08/13/2021-hypercellular bone marrow for age with trilineage hematopoiesis; several atypical lymphoid aggregates present; no increase in blastic cells; flow cytometry without significant T or B-cell abnormalities; normal cytogenetics; "the limited morphologic and immunohistochemical features are atypical and a lymphoproliferative process is not excluded" -PET scan 08/27/2021-widespread hypermetabolic lymphadenopathy in the neck, left axilla, mediastinum, retroperitoneum, and left pelvis.  Hypermetabolic skeletal lesions, generalized bilateral pulmonary hypermetabolism -08/30/2021-1 unit RBCs -Excisional lymph node biopsy left groin 08/30/2021-Large B-cell lymphoma, CD20 positive, CD30 positive; FISH analysis-no evidence of a double/triple hit lymphoma -Cycle 1 R-CHOP 09/05/2021, Granix 09/06/2021 for 7 days -Cycle 2 R-CHOP 09/29/2021, Udenyca -Cycle 3 R-CHOP 10/20/2021, Udenyca -Cycle 4 R-CHOP 11/10/2021, Udenyca -PET 11/21/2021-complete resolution of hypermetabolic lymphadenopathy in some areas and marked decrease in hypermetabolic lymphadenopathy and others, Deauville 1-2, diffuse groundglass lung opacity with hypermetabolism has resolved, decrease in splenomegaly -Cycle 5 R-CHOP 12/11/2021 -Cycle 6 R-CHOP 01/08/2022 Fatigue Dyspnea on exertion Unsteady gait/balance disorder, followed by Dr. Festus Hubert CKD Atrial fibrillation Hypertension Sleep apnea History of anorexia/weight loss 06/13/2021 skin rash, question vasculitis-course of prednisone initiated; 06/20/2021 marked improvement, rash recurred when she was tapered off of  prednisone, prednisone resumed by dermatology History of coagulopathy-most likely related to malnutrition and apixaban Port-A-Cath placement 09/09/2021 Left groin seroma following the left inguinal lymph node biopsy-drained in interventional radiology June 2024 and by Dr. Alethea Andes 07/17/2022 Admission 04/20/2023  with altered mental status 04/20/2023 CT head: 2.7 cm hyperdense mass at the left splenium with mild surrounding edema 04/21/2023 MRI brain: 2.6 x 1.4 x 1.8 cm enhancing mass in the left splenium with mild associated edema, abnormal enhancement at the lateral ventricles Lumbar puncture 04/23/2023: WBCs 22 with 84% lymphocytes, cytology-atypical lymphocytes        Disposition: [name] [name] has a history of advanced stage non-Hodgkin's lymphoma treated with R-CHOP in 2023.  She now has altered mental status and failure to thrive.  CNS imaging confirms a left splenium mass.  There is a CSF lymphocytosis.  "Atypical "lymphocytes were noted on CSF cytology.  I discussed the cytology findings with [name] [name] and her daughter.  The clinical presentation and CSF results are consistent with CNS progression of the non-Hodgkin's lymphoma.  We discussed a repeat lumbar puncture with fluid to be sent for flow cytometry in order to confirm a lymphoma diagnosis.  She does not wish to undergo this procedure.  I discussed treatment of CNS lymphoma with [name] [name].  Treatment options include systemic therapy with high-dose methotrexate, palliative radiation, and referral to consider CAR-T therapy.  She does not appear to be a candidate for high-dose methotrexate and does not wish to consider this.  I think there would be limited benefit from palliative radiation given the apparent CSF involvement.  We discussed referral for CAR-T therapy as this treatment could result in clinical remission.  [name] [name] indicated she does not wish to undergo further testing or treatment of the apparent CNS lymphoma.  She would like to stay  enrolled in hospice care.  She does not wish to schedule a follow-up appointment at the cancer center.  She and her family understand I am available to assist in her care as needed.   I discussed the assessment and treatment plan with the patient. The patient was provided an opportunity to ask questions and all were answered. The patient agreed with the plan and demonstrated an understanding of the instructions.   The patient was advised to call back or seek an in-person evaluation if the symptoms worsen or if the condition fails to improve as anticipated.  I provided 25 minutes of chart review, telephone, and documentation time during this encounter, and > 50% was spent counseling as documented under my assessment & plan.  Coni Deep MD  04/29/2023 10:34 AM

## 2023-05-03 ENCOUNTER — Inpatient Hospital Stay: Payer: Medicare HMO

## 2023-05-03 ENCOUNTER — Telehealth (HOSPITAL_COMMUNITY): Payer: Self-pay | Admitting: Physician Assistant

## 2023-05-03 NOTE — Telephone Encounter (Signed)
 Currently in hospice

## 2023-05-13 ENCOUNTER — Ambulatory Visit (HOSPITAL_COMMUNITY): Payer: HMO | Admitting: Physician Assistant

## 2023-05-13 ENCOUNTER — Encounter: Payer: Self-pay | Admitting: Oncology

## 2023-05-17 ENCOUNTER — Telehealth: Payer: Self-pay

## 2023-05-17 NOTE — Telephone Encounter (Signed)
 LVM for pt daughter regarding Her completed  FMLA forms. I state that they were faxed with confirmation received. I also let her know that she can pick up her hard copy from [facility], No questions or concerns at the this time.

## 2023-05-31 ENCOUNTER — Ambulatory Visit: Payer: Medicare HMO

## 2023-06-13 DEATH — deceased

## 2023-07-12 ENCOUNTER — Other Ambulatory Visit: Payer: Medicare HMO

## 2023-07-12 ENCOUNTER — Ambulatory Visit: Payer: Medicare HMO | Admitting: Oncology

## 2023-08-10 NOTE — Procedures (Signed)
Mask fit

## 2023-08-19 ENCOUNTER — Other Ambulatory Visit: Payer: Medicare HMO
